# Patient Record
Sex: Male | Born: 1955
Health system: Southern US, Community
[De-identification: ages and names within clinical notes are randomized; demographics above are authoritative.]

## PROBLEM LIST (undated history)

## (undated) DIAGNOSIS — I4892 Unspecified atrial flutter: Secondary | ICD-10-CM

## (undated) DIAGNOSIS — I77810 Thoracic aortic ectasia: Secondary | ICD-10-CM

## (undated) DIAGNOSIS — I499 Cardiac arrhythmia, unspecified: Secondary | ICD-10-CM

## (undated) DIAGNOSIS — I5023 Acute on chronic systolic (congestive) heart failure: Secondary | ICD-10-CM

## (undated) DIAGNOSIS — K219 Gastro-esophageal reflux disease without esophagitis: Secondary | ICD-10-CM

## (undated) DIAGNOSIS — F419 Anxiety disorder, unspecified: Secondary | ICD-10-CM

## (undated) DIAGNOSIS — M1990 Unspecified osteoarthritis, unspecified site: Secondary | ICD-10-CM

## (undated) DIAGNOSIS — Z9289 Personal history of other medical treatment: Secondary | ICD-10-CM

## (undated) DIAGNOSIS — I340 Nonrheumatic mitral (valve) insufficiency: Secondary | ICD-10-CM

## (undated) DIAGNOSIS — Z860100 Personal history of colon polyps, unspecified: Secondary | ICD-10-CM

## (undated) DIAGNOSIS — I10 Essential (primary) hypertension: Secondary | ICD-10-CM

## (undated) DIAGNOSIS — R011 Cardiac murmur, unspecified: Secondary | ICD-10-CM

## (undated) DIAGNOSIS — G4700 Insomnia, unspecified: Secondary | ICD-10-CM

## (undated) DIAGNOSIS — T7840XA Allergy, unspecified, initial encounter: Secondary | ICD-10-CM

## (undated) DIAGNOSIS — I420 Dilated cardiomyopathy: Secondary | ICD-10-CM

## (undated) DIAGNOSIS — R569 Unspecified convulsions: Secondary | ICD-10-CM

## (undated) DIAGNOSIS — I4819 Other persistent atrial fibrillation: Secondary | ICD-10-CM

## (undated) DIAGNOSIS — I5022 Chronic systolic (congestive) heart failure: Secondary | ICD-10-CM

## (undated) DIAGNOSIS — I341 Nonrheumatic mitral (valve) prolapse: Secondary | ICD-10-CM

## (undated) HISTORY — PX: ARTHROSCOPY KNEE W/ DRILLING: SUR92

## (undated) HISTORY — PX: CARDIAC VALVE REPLACEMENT: SHX585

## (undated) HISTORY — PX: COLONOSCOPY: SHX174

## (undated) HISTORY — PX: JOINT REPLACEMENT: SHX530

## (undated) HISTORY — PX: LIMB SPARING RESECTION HIP W/ SADDLE JOINT REPLACEMENT: SUR829

## (undated) HISTORY — DX: Nonrheumatic mitral (valve) insufficiency: I34.0

## (undated) HISTORY — DX: Allergy, unspecified, initial encounter: T78.40XA

## (undated) HISTORY — PX: ANKLE FUSION: SHX881

## (undated) HISTORY — DX: Insomnia, unspecified: G47.00

## (undated) HISTORY — PX: HAIR TRANSPLANT: SHX1719

---

## 2000-01-07 ENCOUNTER — Encounter: Payer: Self-pay | Admitting: *Deleted

## 2000-01-07 ENCOUNTER — Encounter: Admission: RE | Admit: 2000-01-07 | Discharge: 2000-01-07 | Payer: Self-pay | Admitting: *Deleted

## 2010-09-24 ENCOUNTER — Ambulatory Visit: Payer: Self-pay | Admitting: Cardiology

## 2010-12-04 ENCOUNTER — Other Ambulatory Visit: Payer: Self-pay

## 2011-01-07 ENCOUNTER — Other Ambulatory Visit (HOSPITAL_COMMUNITY): Payer: Self-pay | Admitting: Orthopaedic Surgery

## 2011-01-07 ENCOUNTER — Encounter (HOSPITAL_COMMUNITY): Payer: BC Managed Care – PPO

## 2011-01-07 ENCOUNTER — Ambulatory Visit (HOSPITAL_COMMUNITY)
Admission: RE | Admit: 2011-01-07 | Discharge: 2011-01-07 | Disposition: A | Discharge status: Home / Self Care | Payer: BC Managed Care – PPO | Source: Ambulatory Visit | Attending: Orthopaedic Surgery | Admitting: Orthopaedic Surgery

## 2011-01-07 DIAGNOSIS — I10 Essential (primary) hypertension: Secondary | ICD-10-CM | POA: Insufficient documentation

## 2011-01-07 DIAGNOSIS — M25559 Pain in unspecified hip: Secondary | ICD-10-CM

## 2011-01-07 DIAGNOSIS — Z01818 Encounter for other preprocedural examination: Secondary | ICD-10-CM | POA: Insufficient documentation

## 2011-01-07 LAB — BASIC METABOLIC PANEL
CO2: 28 mEq/L (ref 19–32)
Calcium: 9.4 mg/dL (ref 8.4–10.5)
Chloride: 107 mEq/L (ref 96–112)
GFR calc Af Amer: >60 mL/min (ref >60)
Glucose, Bld: 89 mg/dL (ref 70–99)
Potassium: 4 mEq/L (ref 3.5–5.1)
Sodium: 142 mEq/L (ref 135–145)

## 2011-01-07 LAB — DIFFERENTIAL
Basophils Absolute: 0 10*3/uL (ref 0.0–0.1)
Basophils Relative: 0 % (ref 0–1)
Eosinophils Absolute: 0.2 10*3/uL (ref 0.0–0.7)
Eosinophils Relative: 2 % (ref 0–5)
Lymphocytes Relative: 16 % (ref 12–46)
Lymphs Abs: 1.4 10*3/uL (ref 0.7–4.0)
Monocytes Absolute: 0.6 10*3/uL (ref 0.1–1.0)
Monocytes Relative: 7 % (ref 3–12)
Neutro Abs: 6.8 10*3/uL (ref 1.7–7.7)
Neutrophils Relative %: 75 % (ref 43–77)

## 2011-01-07 LAB — URINALYSIS, ROUTINE W REFLEX MICROSCOPIC
Bilirubin Urine: NEGATIVE
Glucose, UA: NEGATIVE mg/dL
Hgb urine dipstick: NEGATIVE
Ketones, ur: NEGATIVE mg/dL
Nitrite: NEGATIVE
Protein, ur: NEGATIVE mg/dL
Specific Gravity, Urine: 1.03 (ref 1.005–1.030)
Urobilinogen, UA: 0.2 mg/dL (ref 0.0–1.0)
pH: 5.5 (ref 5.0–8.0)

## 2011-01-07 LAB — CBC
HCT: 40.4 % (ref 39.0–52.0)
Hemoglobin: 12.9 g/dL — ABNORMAL LOW (ref 13.0–17.0)
MCH: 29.7 pg (ref 26.0–34.0)
MCHC: 31.9 g/dL (ref 30.0–36.0)
MCV: 93.1 fL (ref 78.0–100.0)
Platelets: 200 10*3/uL (ref 150–400)
RBC: 4.34 MIL/uL (ref 4.22–5.81)
RDW: 13.2 % (ref 11.5–15.5)
WBC: 9 10*3/uL (ref 4.0–10.5)

## 2011-01-07 LAB — SURGICAL PCR SCREEN: MRSA, PCR: NEGATIVE

## 2011-01-09 ENCOUNTER — Inpatient Hospital Stay (HOSPITAL_COMMUNITY): Payer: BC Managed Care – PPO

## 2011-01-09 ENCOUNTER — Inpatient Hospital Stay (HOSPITAL_COMMUNITY)
Admission: RE | Admit: 2011-01-09 | Payer: BC Managed Care – PPO | Source: Ambulatory Visit | Admitting: Orthopaedic Surgery

## 2011-01-09 ENCOUNTER — Inpatient Hospital Stay (HOSPITAL_COMMUNITY)
Admission: RE | Admit: 2011-01-09 | Discharge: 2011-01-12 | DRG: 818 | Disposition: A | Discharge status: Home Health | Payer: BC Managed Care – PPO | Source: Ambulatory Visit | Attending: Orthopaedic Surgery | Admitting: Orthopaedic Surgery

## 2011-01-09 DIAGNOSIS — I10 Essential (primary) hypertension: Secondary | ICD-10-CM | POA: Diagnosis present

## 2011-01-09 DIAGNOSIS — M129 Arthropathy, unspecified: Secondary | ICD-10-CM | POA: Diagnosis present

## 2011-01-09 DIAGNOSIS — I059 Rheumatic mitral valve disease, unspecified: Secondary | ICD-10-CM | POA: Diagnosis present

## 2011-01-09 DIAGNOSIS — M1990 Unspecified osteoarthritis, unspecified site: Secondary | ICD-10-CM

## 2011-01-09 DIAGNOSIS — K219 Gastro-esophageal reflux disease without esophagitis: Secondary | ICD-10-CM | POA: Diagnosis present

## 2011-01-09 DIAGNOSIS — M169 Osteoarthritis of hip, unspecified: Principal | ICD-10-CM | POA: Diagnosis present

## 2011-01-09 DIAGNOSIS — M1610 Unilateral primary osteoarthritis, unspecified hip: Principal | ICD-10-CM | POA: Diagnosis present

## 2011-01-09 LAB — TYPE AND SCREEN
ABO/RH(D): O POS
Antibody Screen: NEGATIVE

## 2011-01-09 LAB — ABO/RH: ABO/RH(D): O POS

## 2011-01-10 LAB — CBC
HCT: 34.2 % — ABNORMAL LOW (ref 39.0–52.0)
MCHC: 31.9 g/dL (ref 30.0–36.0)
Platelets: 168 10*3/uL (ref 150–400)
RDW: 12.9 % (ref 11.5–15.5)

## 2011-01-10 LAB — BASIC METABOLIC PANEL
BUN: 8 mg/dL (ref 6–23)
CO2: 28 mEq/L (ref 19–32)
Calcium: 8.1 mg/dL — ABNORMAL LOW (ref 8.4–10.5)
Chloride: 102 mEq/L (ref 96–112)
Creatinine, Ser: 0.66 mg/dL (ref 0.4–1.5)

## 2011-01-10 LAB — PROTIME-INR: INR: 1.18 (ref 0.00–1.49)

## 2011-01-10 NOTE — Op Note (Signed)
[name], [name]                ACCOUNT NO.:  [id number]  MEDICAL RECORD NO.:  [id number]           PATIENT TYPE:  I  LOCATION:  1609                         FACILITY:  [facility]  PHYSICIAN:  Vanita Panda. Magnus Ivan, M.D.DATE OF BIRTH:  [birth year]  DATE OF PROCEDURE:  01/09/2011 DATE OF DISCHARGE:                              OPERATIVE REPORT   PREOPERATIVE DIAGNOSIS:  Severe osteoarthritis and degenerative joint disease, right hip.  POSTOPERATIVE DIAGNOSIS:  Severe osteoarthritis and degenerative joint disease, right hip.  PROCEDURE:  Right total hip arthroplasty through direct anterior approach.  IMPLANTS:  DePuy size 56 acetabular component with Gription, size 56 polyethylene liner, size 36 +8.5 ceramic head, standard offset Corail femoral component size 11.  SURGEON:  Vanita Panda. Magnus Ivan, MD  ASSISTANT:  Wende Neighbors, PA  ANESTHESIA:  General.  ANTIBIOTICS:  Ancef 2 g IV.  BLOOD LOSS:  500 mL.  COMPLICATIONS:  None.  INDICATIONS:  [name] is a 55 year old gentleman with end-stage osteoarthritis involving his right hip.  This has affected his activities of daily living.  He has gotten to where he has had tried injection of the hip and this has not helped and x-rays confirmed end- stage arthritis of the hip.  At this point, he wishes to proceed with a total hip arthroplasty.  The risks and benefits of surgery explained to him in detail and he did wish to proceed with surgery.  PROCEDURE DESCRIPTION:  After informed consent was obtained, appropriate right hip was marked.  He was brought to the operating room and general anesthesia was obtained while he was in the stretcher.  Foley catheter was placed and traction boots were placed on his feet, so he could probably be placed on the Hana OR table.  The perineal post was placed. He was placed on the Hana table.  I then used a C-arm to assess the hip center in the area where I judged the leg lengths.   Preoperatively, I felt clinically his leg lengths were about equal, but radiographically he was definitely off on his leg length with his right being shorter. We then prepped the right hip with DuraPrep and sterile drapes.  A time- out was called.  He was identified as the correct patient and correct right hip.  I then made an incision 1 cm distal and 3 cm posterior to the anterior-superior iliac spine.  I carried the oblique incision down the leg.  I was able to dissect the soft tissues down to the tensor fascia lata.  The tensor fascia lata was divided longitudinally and then I placed cobra retractor anterior on the lateral neck.  I teased up into the rectus femoris, reflected head and placed the cobra retractor.  I then divided the joint capsule to expose the femoral head and neck.  I dissected down further distally and cauterized the lateral femoral circumflex vessels.  I then made a femoral neck cut just proximal to lesser trochanter.  I removed the head and neck in its entirety.  I cleaned the acetabulum of debris.  I began reaming from size 46 reamer up to a 55.  We then placed a real 56 acetabulum component with Gription and under direct fluoroscopic guidance we were able to judge out anteversion and inclination.  Next, attention was turned to the femur. I externally rotated the leg all the way to 90 degrees, then extended the hip and adducted to allow exposure to the femoral canal.  Releasing tissue behind the greater trochanter and elevating the lateral joint capsule, we were able to gain access to the femoral canal.  I then began broaching up to size 11 broach with a compaction broaching system from Corail.  The 11 broach was tight.  I then placed 36 +1.5 head bone and reduced this in the acetabulum.  When I externally rotated him to 90 degrees he easily dislocated, so then I trailed a 5 and then an 8.5 hip bulk and this was felt to be much more stable.  I felt that may be  my stem was in a little bit too much anteversion as well.  I then placed a real Corail 11 stem with HA coating and a collar and was able to dial out some of the anteversion and get him a better alignment.  I then trialed the head again and we decided to go with +8.5 head, but this offered the most stability with internal and external rotation.  Under direct fluoroscopic guidance, he sill appears a little short on that hip; however, after we washed the tissues, closed the deep capsule, subcutaneous tissue and the subcuticular tissue and got him off the bed onto the stretcher, but waking up, he felt longer on the right side.  He did appear to be stable there.  We will see what postoperative films show.  He was awakened, extubated, and taken to recovery room in stable condition.  All final counts were correct and there were no complications noted.     Vanita Panda. Magnus Ivan, M.D.     CYB/MEDQ  D:  01/09/2011  T:  01/10/2011  Job:  [id number]  Electronically Signed by Doneen Poisson M.D. on 01/10/2011 07:18:18 PM

## 2011-01-10 NOTE — H&P (Signed)
  [name], [name]                ACCOUNT NO.:  [id number]  MEDICAL RECORD NO.:  [id number]           PATIENT TYPE:  I  LOCATION:  1609                         FACILITY:  [facility]  PHYSICIAN:  Vanita Panda. Magnus Ivan, M.D.DATE OF BIRTH:  12-28-1955  DATE OF ADMISSION:  01/09/2011 DATE OF DISCHARGE:                             HISTORY & PHYSICAL   CHIEF COMPLAINT:  Severe right hip pain.  HISTORY OF PRESENT ILLNESS:  [name] is a 55 year old gentleman with severe end-stage arthritis of his right hip.  He is coming to the operating room today for an elective right total hip arthroplasty due to the failure of conservative treatment including injections, antiinflammatories, and rest.  X-rays confirmed bone-on-bone ware of the hip.  He understands in detail the risks and benefits of surgery and does wish to proceed with surgery.  PAST MEDICAL HISTORY: 1. High blood pressure. 2. Mitral valve prolapse. 3. Acid reflux.4. Arthritis.  CURRENT MEDICATIONS:  See medical reconciliation orders.  ALLERGIES:  No known drug allergies.  FAMILY MEDICAL HISTORY:  Diabetes and high blood pressure.  SOCIAL HISTORY:  He is married.  He does not smoke and he does drink daily.  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  REVIEW OF SYSTEMS:  Negative for chest pain, shortness of breath, fever, chills, nausea, and vomiting.  PHYSICAL EXAMINATION:  VITAL SIGNS:  He is afebrile with stable vital signs. GENERAL:  He is alert and oriented x3 in no acute distress or obvious discomfort. HEENT:  Normocephalic, atraumatic.  Pupils are equal, round, and reactive to light.  Extraocular muscles are intact. NECK:  Supple. LUNGS:  Clear to auscultation bilaterally. HEART:  Regular rate and rhythm with a small murmur. ABDOMEN:  Benign. EXTREMITIES:  Right hip shows severe pain with internal and external rotation, flexion and extension.  IMAGING:  X-rays again reviewed and confirmed end-stage arthritis  of the right hip.  ASSESSMENT:  Severe osteoarthritis, right hip.  PLAN:  We will proceed with a direct anterior total hip arthroplasty today.  Again, he understands the risks and benefits of the surgery and the reason behind proceeding with surgery.  He will be admitted as an inpatient following surgery.     Vanita Panda. Magnus Ivan, M.D.     CYB/MEDQ  D:  01/09/2011  T:  01/10/2011  Job:  [id number]  Electronically Signed by Doneen Poisson M.D. on 01/10/2011 07:18:15 PM

## 2011-01-11 LAB — CBC
MCH: 30.2 pg (ref 26.0–34.0)
MCV: 93.5 fL (ref 78.0–100.0)
Platelets: 146 10*3/uL — ABNORMAL LOW (ref 150–400)
RDW: 13 % (ref 11.5–15.5)
WBC: 9.4 10*3/uL (ref 4.0–10.5)

## 2011-01-11 LAB — PROTIME-INR: Prothrombin Time: 17.3 seconds — ABNORMAL HIGH (ref 11.6–15.2)

## 2011-01-11 LAB — BASIC METABOLIC PANEL
BUN: 7 mg/dL (ref 6–23)
Creatinine, Ser: 0.66 mg/dL (ref 0.4–1.5)
GFR calc non Af Amer: >60 mL/min (ref >60)

## 2011-01-12 LAB — BASIC METABOLIC PANEL
BUN: 7 mg/dL (ref 6–23)
Calcium: 8 mg/dL — ABNORMAL LOW (ref 8.4–10.5)
GFR calc non Af Amer: >60 mL/min (ref >60)
Glucose, Bld: 124 mg/dL — ABNORMAL HIGH (ref 70–99)

## 2011-01-12 LAB — CBC
MCH: 29.7 pg (ref 26.0–34.0)
MCHC: 31.9 g/dL (ref 30.0–36.0)
Platelets: 142 10*3/uL — ABNORMAL LOW (ref 150–400)
RDW: 12.7 % (ref 11.5–15.5)

## 2011-01-12 LAB — PROTIME-INR: Prothrombin Time: 18.5 seconds — ABNORMAL HIGH (ref 11.6–15.2)

## 2011-09-22 ENCOUNTER — Other Ambulatory Visit (HOSPITAL_COMMUNITY): Payer: Self-pay | Admitting: Orthopedic Surgery

## 2011-09-28 ENCOUNTER — Encounter (HOSPITAL_COMMUNITY): Payer: Self-pay | Admitting: Pharmacy Technician

## 2011-10-02 ENCOUNTER — Encounter (HOSPITAL_COMMUNITY): Payer: Self-pay

## 2011-10-02 ENCOUNTER — Encounter (HOSPITAL_COMMUNITY)
Admission: RE | Admit: 2011-10-02 | Discharge: 2011-10-02 | Disposition: A | Discharge status: Home / Self Care | Payer: BC Managed Care – PPO | Source: Ambulatory Visit | Attending: Orthopedic Surgery | Admitting: Orthopedic Surgery

## 2011-10-02 HISTORY — DX: Essential (primary) hypertension: I10

## 2011-10-02 HISTORY — DX: Unspecified convulsions: R56.9

## 2011-10-02 HISTORY — DX: Gastro-esophageal reflux disease without esophagitis: K21.9

## 2011-10-02 HISTORY — DX: Unspecified osteoarthritis, unspecified site: M19.90

## 2011-10-02 LAB — PROTIME-INR
INR: 1.04 (ref 0.00–1.49)
Prothrombin Time: 13.8 seconds (ref 11.6–15.2)

## 2011-10-02 LAB — CBC
MCH: 30.4 pg (ref 26.0–34.0)
MCV: 91.8 fL (ref 78.0–100.0)
Platelets: 181 10*3/uL (ref 150–400)
RDW: 12.8 % (ref 11.5–15.5)

## 2011-10-02 LAB — COMPREHENSIVE METABOLIC PANEL
AST: 13 U/L (ref 0–37)
Albumin: 4 g/dL (ref 3.5–5.2)
BUN: 18 mg/dL (ref 6–23)
CO2: 26 mEq/L (ref 19–32)
Calcium: 9.2 mg/dL (ref 8.4–10.5)
Creatinine, Ser: 0.77 mg/dL (ref 0.50–1.35)
GFR calc non Af Amer: >90 mL/min (ref >90)

## 2011-10-02 LAB — SURGICAL PCR SCREEN: MRSA, PCR: NEGATIVE

## 2011-10-02 NOTE — Pre-Procedure Instructions (Signed)
[name]  10/02/2011   Your procedure is scheduled on:  10-07-2011 @ 2:00PM  Report to [facility] at 12 noon.  Call this number if you have problems the morning of surgery: [phone]   Remember:   Do not eat food:After Midnight.  May have clear liquids: up to 4 Hours before arrival.  Clear liquids include soda, tea, black coffee, apple or grape juice, broth. Until 8:00 AM  Take these medicines the morning of surgery with A SIP OF WATER  Norvasc,Cardura,oxycodon.Proscar    Do not wear jewelry, make-up or nail polish.  Do not wear lotions, powders, or perfumes. You may wear deodorant.  Do not shave 48 hours prior to surgery.  Do not bring valuables to the hospital.  Contacts, dentures or bridgework may not be worn into surgery.  Leave suitcase in the car. After surgery it may be brought to your room.  For patients admitted to the hospital, checkout time is 11:00 AM the day of discharge.   Patients discharged the day of surgery will not be allowed to drive home.  Name and phone number of your driver:   Special Instructions: CHG Shower Use Special Wash: 1/2 bottle night before surgery and 1/2 bottle morning of surgery.   Please read over the following fact sheets that you were given: MRSA Information and Surgical Site Infection Prevention

## 2011-10-02 NOTE — Pre-Procedure Instructions (Signed)
[name]  10/02/2011   Your procedure is scheduled on: 10-07-2011  2:00 PM  Report to [facility] at 11:30  AM.  Time is per Dr. Audrie Lia office  Call this number if you have problems the morning of surgery: [phone]   Remember:   Do not eat food:After Midnight.  May have clear liquids: up to 4 Hours before arrival.  Clear liquids include soda, tea, black coffee, apple or grape juice, broth.  Until 7:30 AM  Take these medicines the morning of surgery with A SIP OF WATER:proscar, oxycodone amlodpine,doxazosin   Do not wear jewelry, make-up or nail polish.  Do not wear lotions, powders, or perfumes. You may wear deodorant.  Do not shave 48 hours prior to surgery.  Do not bring valuables to the hospital.  Contacts, dentures or bridgework may not be worn into surgery.  Leave suitcase in the car. After surgery it may be brought to your room.  For patients admitted to the hospital, checkout time is 11:00 AM the day of discharge.   Patients discharged the day of surgery will not be allowed to drive home.    Special Instructions: CHG Shower Use Special Wash: 1/2 bottle night before surgery and 1/2 bottle morning of surgery.   Please read over the following fact sheets that you were given: MRSA Information and Surgical Site Infection Prevention

## 2011-10-05 NOTE — Consult Note (Signed)
Anesthesia:  EKG noted and reviewed with Dr. Jean Rosenthal.  Plan to proceed with left tibiocalcaneal fusion as scheduled.

## 2011-10-06 MED ORDER — CEFAZOLIN SODIUM-DEXTROSE 2-3 GM-% IV SOLR
2.0000 g | INTRAVENOUS | Status: AC
  Administered 2011-10-07: 2 g via INTRAVENOUS
  Filled 2011-10-06: qty 50

## 2011-10-07 ENCOUNTER — Encounter (HOSPITAL_COMMUNITY): Payer: Self-pay | Admitting: Vascular Surgery

## 2011-10-07 ENCOUNTER — Encounter (HOSPITAL_COMMUNITY)
Admission: RE | Disposition: A | Discharge status: Home / Self Care | Payer: Self-pay | Source: Ambulatory Visit | Attending: Orthopedic Surgery

## 2011-10-07 ENCOUNTER — Inpatient Hospital Stay (HOSPITAL_COMMUNITY)
Admission: RE | Admit: 2011-10-07 | Discharge: 2011-10-09 | DRG: 219 | Disposition: A | Discharge status: Home / Self Care | Payer: BC Managed Care – PPO | Source: Ambulatory Visit | Attending: Orthopedic Surgery | Admitting: Orthopedic Surgery

## 2011-10-07 ENCOUNTER — Inpatient Hospital Stay (HOSPITAL_COMMUNITY): Payer: BC Managed Care – PPO | Admitting: Vascular Surgery

## 2011-10-07 DIAGNOSIS — M19079 Primary osteoarthritis, unspecified ankle and foot: Secondary | ICD-10-CM

## 2011-10-07 DIAGNOSIS — G40802 Other epilepsy, not intractable, without status epilepticus: Secondary | ICD-10-CM | POA: Diagnosis present

## 2011-10-07 DIAGNOSIS — I10 Essential (primary) hypertension: Secondary | ICD-10-CM | POA: Diagnosis present

## 2011-10-07 DIAGNOSIS — K219 Gastro-esophageal reflux disease without esophagitis: Secondary | ICD-10-CM | POA: Diagnosis present

## 2011-10-07 DIAGNOSIS — Z01810 Encounter for preprocedural cardiovascular examination: Secondary | ICD-10-CM

## 2011-10-07 DIAGNOSIS — M216X9 Other acquired deformities of unspecified foot: Principal | ICD-10-CM | POA: Diagnosis present

## 2011-10-07 DIAGNOSIS — Z01812 Encounter for preprocedural laboratory examination: Secondary | ICD-10-CM

## 2011-10-07 DIAGNOSIS — Z96649 Presence of unspecified artificial hip joint: Secondary | ICD-10-CM

## 2011-10-07 DIAGNOSIS — Z01818 Encounter for other preprocedural examination: Secondary | ICD-10-CM

## 2011-10-07 SURGERY — ANKLE FUSION
Anesthesia: General | Site: Ankle | Laterality: Left | Wound class: Clean

## 2011-10-07 MED ORDER — WARFARIN SODIUM 10 MG PO TABS
10.0000 mg | ORAL_TABLET | Freq: Once | ORAL | Status: AC
  Administered 2011-10-07 (×2): 10 mg via ORAL
  Filled 2011-10-07: qty 1

## 2011-10-07 MED ORDER — HYDROMORPHONE HCL PF 1 MG/ML IJ SOLN: 0.2500 mg | INTRAMUSCULAR | Status: DC | PRN

## 2011-10-07 MED ORDER — FENTANYL CITRATE 0.05 MG/ML IJ SOLN
100.0000 ug | INTRAMUSCULAR | Status: DC | PRN
  Administered 2011-10-07: 100 ug via INTRAVENOUS

## 2011-10-07 MED ORDER — METHOCARBAMOL 500 MG PO TABS
500.0000 mg | ORAL_TABLET | Freq: Four times a day (QID) | ORAL | Status: DC | PRN
  Administered 2011-10-07 – 2011-10-09 (×4): 500 mg via ORAL
  Filled 2011-10-07 (×4): qty 1

## 2011-10-07 MED ORDER — MIDAZOLAM HCL 2 MG/2ML IJ SOLN
INTRAMUSCULAR | Status: AC
  Filled 2011-10-07: qty 2

## 2011-10-07 MED ORDER — CEFAZOLIN SODIUM-DEXTROSE 2-3 GM-% IV SOLR
2.0000 g | Freq: Four times a day (QID) | INTRAVENOUS | Status: AC
  Administered 2011-10-07 – 2011-10-08 (×3): 2 g via INTRAVENOUS
  Filled 2011-10-07 (×3): qty 50

## 2011-10-07 MED ORDER — DIPHENHYDRAMINE HCL 12.5 MG/5ML PO ELIX
12.5000 mg | ORAL_SOLUTION | ORAL | Status: DC | PRN
  Filled 2011-10-07: qty 10

## 2011-10-07 MED ORDER — METHOCARBAMOL 100 MG/ML IJ SOLN: 500.0000 mg | Freq: Four times a day (QID) | INTRAVENOUS | Status: DC | PRN

## 2011-10-07 MED ORDER — FENTANYL CITRATE 0.05 MG/ML IJ SOLN
INTRAMUSCULAR | Status: AC
  Filled 2011-10-07: qty 2

## 2011-10-07 MED ORDER — GLYCOPYRROLATE 0.2 MG/ML IJ SOLN
INTRAMUSCULAR | Status: DC | PRN
  Administered 2011-10-07: 0.2 mg via INTRAVENOUS

## 2011-10-07 MED ORDER — FENTANYL CITRATE 0.05 MG/ML IJ SOLN
INTRAMUSCULAR | Status: DC | PRN
  Administered 2011-10-07 (×3): 50 ug via INTRAVENOUS
  Administered 2011-10-07: 100 ug via INTRAVENOUS

## 2011-10-07 MED ORDER — METOCLOPRAMIDE HCL 5 MG/ML IJ SOLN
5.0000 mg | Freq: Three times a day (TID) | INTRAMUSCULAR | Status: DC | PRN
  Filled 2011-10-07: qty 2

## 2011-10-07 MED ORDER — HYDROMORPHONE HCL PF 1 MG/ML IJ SOLN
0.5000 mg | INTRAMUSCULAR | Status: DC | PRN
  Administered 2011-10-07 – 2011-10-08 (×7): 1 mg via INTRAVENOUS
  Administered 2011-10-08: 0.5 mg via INTRAVENOUS
  Filled 2011-10-07 (×8): qty 1

## 2011-10-07 MED ORDER — LACTATED RINGERS IV SOLN
INTRAVENOUS | Status: DC
  Administered 2011-10-07: 12:00:00 via INTRAVENOUS

## 2011-10-07 MED ORDER — DROPERIDOL 2.5 MG/ML IJ SOLN: 0.6250 mg | INTRAMUSCULAR | Status: DC | PRN

## 2011-10-07 MED ORDER — METOCLOPRAMIDE HCL 10 MG PO TABS: 5.0000 mg | ORAL_TABLET | Freq: Three times a day (TID) | ORAL | Status: DC | PRN

## 2011-10-07 MED ORDER — HYDROCODONE-ACETAMINOPHEN 10-325 MG PO TABS: 1.0000 | ORAL_TABLET | ORAL | Status: DC | PRN

## 2011-10-07 MED ORDER — WARFARIN VIDEO: Freq: Once | Status: DC

## 2011-10-07 MED ORDER — ONDANSETRON HCL 4 MG PO TABS: 4.0000 mg | ORAL_TABLET | Freq: Four times a day (QID) | ORAL | Status: DC | PRN

## 2011-10-07 MED ORDER — SODIUM CHLORIDE 0.9 % IR SOLN
Status: DC | PRN
  Administered 2011-10-07: 1000 mL

## 2011-10-07 MED ORDER — LACTATED RINGERS IV SOLN
INTRAVENOUS | Status: DC | PRN
  Administered 2011-10-07: 12:00:00 via INTRAVENOUS

## 2011-10-07 MED ORDER — ONDANSETRON HCL 4 MG/2ML IJ SOLN: 4.0000 mg | Freq: Four times a day (QID) | INTRAMUSCULAR | Status: DC | PRN

## 2011-10-07 MED ORDER — ONDANSETRON HCL 4 MG/2ML IJ SOLN
INTRAMUSCULAR | Status: DC | PRN
  Administered 2011-10-07: 4 mg via INTRAVENOUS

## 2011-10-07 MED ORDER — DOXAZOSIN MESYLATE 4 MG PO TABS
4.0000 mg | ORAL_TABLET | Freq: Every evening | ORAL | Status: DC
Start: 2011-10-07 — End: 2011-10-09
  Administered 2011-10-07 – 2011-10-08 (×2): 4 mg via ORAL
  Filled 2011-10-07 (×3): qty 1

## 2011-10-07 MED ORDER — DOCUSATE SODIUM 100 MG PO CAPS
100.0000 mg | ORAL_CAPSULE | Freq: Two times a day (BID) | ORAL | Status: DC
  Administered 2011-10-07 – 2011-10-08 (×3): 100 mg via ORAL
  Filled 2011-10-07 (×6): qty 1

## 2011-10-07 MED ORDER — AMLODIPINE BESYLATE 10 MG PO TABS
10.0000 mg | ORAL_TABLET | ORAL | Status: DC
  Administered 2011-10-08 – 2011-10-09 (×2): 10 mg via ORAL
  Filled 2011-10-07 (×3): qty 1

## 2011-10-07 MED ORDER — ACETAMINOPHEN 10 MG/ML IV SOLN
INTRAVENOUS | Status: DC | PRN
  Administered 2011-10-07: 1000 mg via INTRAVENOUS

## 2011-10-07 MED ORDER — PATIENT'S GUIDE TO USING COUMADIN BOOK
Freq: Once | Status: AC
  Administered 2011-10-07: 20:00:00
  Filled 2011-10-07: qty 1

## 2011-10-07 MED ORDER — PROPOFOL 10 MG/ML IV EMUL
INTRAVENOUS | Status: DC | PRN
  Administered 2011-10-07: 200 mg via INTRAVENOUS

## 2011-10-07 MED ORDER — ACETAMINOPHEN 10 MG/ML IV SOLN
INTRAVENOUS | Status: AC
  Filled 2011-10-07: qty 100

## 2011-10-07 MED ORDER — SODIUM CHLORIDE 0.9 % IV SOLN
INTRAVENOUS | Status: DC
  Administered 2011-10-07: 20 mL/h via INTRAVENOUS

## 2011-10-07 MED ORDER — FINASTERIDE 5 MG PO TABS: 2.5000 mg | ORAL_TABLET | ORAL | Status: DC

## 2011-10-07 MED ORDER — MIDAZOLAM HCL 2 MG/2ML IJ SOLN
2.0000 mg | Freq: Once | INTRAMUSCULAR | Status: AC
  Administered 2011-10-07: 2 mg via INTRAVENOUS

## 2011-10-07 MED ORDER — FINASTERIDE 5 MG PO TABS
2.5000 mg | ORAL_TABLET | Freq: Every day | ORAL | Status: DC
  Filled 2011-10-07: qty 0.5

## 2011-10-07 MED ORDER — OXYCODONE HCL 5 MG PO TABS
5.0000 mg | ORAL_TABLET | ORAL | Status: DC | PRN
  Administered 2011-10-08 (×2): 10 mg via ORAL
  Filled 2011-10-07 (×2): qty 2

## 2011-10-07 SURGICAL SUPPLY — 51 items
2.6X80CM GUIDEWIRE ×2 IMPLANT
3.2MM GUIDEWIRE ×2 IMPLANT
BANDAGE ESMARK 6X9 LF (GAUZE/BANDAGES/DRESSINGS) IMPLANT
BANDAGE GAUZE ELAST BULKY 4 IN (GAUZE/BANDAGES/DRESSINGS) ×4 IMPLANT
BIT DRILL CALIBRATED 4.3X320MM (BIT) ×1 IMPLANT
BIT DRILL CANN 7X200 (BIT) ×2 IMPLANT
BLADE SAW SGTL HD 18.5X60.5X1. (BLADE) ×2 IMPLANT
BLADE SURG 10 STRL SS (BLADE) IMPLANT
BNDG COHESIVE 4X5 TAN STRL (GAUZE/BANDAGES/DRESSINGS) ×2 IMPLANT
BNDG ESMARK 6X9 LF (GAUZE/BANDAGES/DRESSINGS)
CLOTH BEACON ORANGE TIMEOUT ST (SAFETY) ×2 IMPLANT
COTTON STERILE ROLL (GAUZE/BANDAGES/DRESSINGS) IMPLANT
COVER MAYO STAND STRL (DRAPES) IMPLANT
COVER SURGICAL LIGHT HANDLE (MISCELLANEOUS) ×2 IMPLANT
DRAPE INCISE IOBAN 66X45 STRL (DRAPES) ×2 IMPLANT
DRAPE OEC MINIVIEW 54X84 (DRAPES) IMPLANT
DRAPE U-SHAPE 47X51 STRL (DRAPES) ×2 IMPLANT
DRILL CALIBRATED 4.3X320MM (BIT) ×2
DRSG ADAPTIC 3X8 NADH LF (GAUZE/BANDAGES/DRESSINGS) ×2 IMPLANT
DURAPREP 26ML APPLICATOR (WOUND CARE) ×2 IMPLANT
ELECT REM PT RETURN 9FT ADLT (ELECTROSURGICAL) ×2
ELECTRODE REM PT RTRN 9FT ADLT (ELECTROSURGICAL) ×1 IMPLANT
GLOVE BIOGEL PI IND STRL 9 (GLOVE) ×1 IMPLANT
GLOVE BIOGEL PI INDICATOR 9 (GLOVE) ×1
GLOVE SURG ORTHO 9.0 STRL STRW (GLOVE) ×2 IMPLANT
GOWN PREVENTION PLUS XLARGE (GOWN DISPOSABLE) ×2 IMPLANT
GOWN SRG XL XLNG 56XLVL 4 (GOWN DISPOSABLE) ×2 IMPLANT
GOWN STRL NON-REIN XL XLG LVL4 (GOWN DISPOSABLE) ×2
GUIDEWIRE 2.6X80 BEAD TIP (Wire) ×1 IMPLANT
GUIDEWIRE LAGSCREW 3.2X460 (WIRE) ×2 IMPLANT
GUIDWIRE 2.6X80 BEAD TIP (Wire) ×2 IMPLANT
KIT BASIN OR (CUSTOM PROCEDURE TRAY) ×2 IMPLANT
KIT ROOM TURNOVER OR (KITS) ×2 IMPLANT
MANIFOLD NEPTUNE II (INSTRUMENTS) ×2 IMPLANT
NAIL LOCK ANKLE ANTE 11X180 (Nail) ×2 IMPLANT
NS IRRIG 1000ML POUR BTL (IV SOLUTION) ×2 IMPLANT
PACK ORTHO EXTREMITY (CUSTOM PROCEDURE TRAY) ×2 IMPLANT
PAD ARMBOARD 7.5X6 YLW CONV (MISCELLANEOUS) ×4 IMPLANT
PAD CAST 4YDX4 CTTN HI CHSV (CAST SUPPLIES) ×1 IMPLANT
PADDING CAST COTTON 4X4 STRL (CAST SUPPLIES) ×1
SCREW CORT TI DBL LEAD 5X30 (Screw) ×2 IMPLANT
SCREW CORT TI DBL LEAD 5X44 (Screw) ×2 IMPLANT
SCREW CORT TI DBL LEAD 5X70 (Screw) ×2 IMPLANT
SPONGE GAUZE 4X4 12PLY (GAUZE/BANDAGES/DRESSINGS) ×2 IMPLANT
SPONGE LAP 18X18 X RAY DECT (DISPOSABLE) ×2 IMPLANT
SUCTION FRAZIER TIP 10 FR DISP (SUCTIONS) ×2 IMPLANT
SUT ETHILON 2 0 PSLX (SUTURE) ×6 IMPLANT
TOWEL OR 17X24 6PK STRL BLUE (TOWEL DISPOSABLE) ×2 IMPLANT
TOWEL OR 17X26 10 PK STRL BLUE (TOWEL DISPOSABLE) ×2 IMPLANT
TUBE CONNECTING 12X1/4 (SUCTIONS) ×2 IMPLANT
WATER STERILE IRR 1000ML POUR (IV SOLUTION) ×2 IMPLANT

## 2011-10-07 NOTE — Preoperative (Addendum)
Beta Blockers   Reason not to administer Beta Blockers:Not Applicable 

## 2011-10-07 NOTE — Anesthesia Procedure Notes (Signed)
Procedure Name: LMA Insertion Date/Time: 10/07/2011 12:11 PM Performed by: Malachi Pro Pre-anesthesia Checklist: Patient identified, Emergency Drugs available, Suction available, Patient being monitored and Timeout performed Patient Re-evaluated:Patient Re-evaluated prior to inductionOxygen Delivery Method: Circle System Utilized Preoxygenation: Pre-oxygenation with 100% oxygen Intubation Type: IV induction LMA: LMA inserted and LMA with gastric port inserted LMA Size: 5.0 Tube secured with: Tape Dental Injury: Teeth and Oropharynx as per pre-operative assessment

## 2011-10-07 NOTE — Anesthesia Preprocedure Evaluation (Addendum)
Anesthesia Evaluation  Patient identified by MRN, date of birth, ID band Patient awake    Reviewed: Allergy & Precautions, H&P , NPO status , Patient's Chart, lab work & pertinent test results  Airway Mallampati: II TM Distance: <3 FB     Dental  (+) Dental Advisory Given and Teeth Intact   Pulmonary neg pulmonary ROS,  clear to auscultation  Pulmonary exam normal       Cardiovascular hypertension, Pt. on medications Regular Normal- Systolic murmurs    Neuro/Psych Seizures -, Well Controlled,     GI/Hepatic Neg liver ROS, GERD-  Controlled,  Endo/Other  Negative Endocrine ROS  Renal/GU negative Renal ROS     Musculoskeletal   Abdominal   Peds  Hematology   Anesthesia Other Findings   Reproductive/Obstetrics                         Anesthesia Physical Anesthesia Plan  ASA: II  Anesthesia Plan: General   Post-op Pain Management:    Induction: Intravenous  Airway Management Planned: LMA  Additional Equipment:   Intra-op Plan:   Post-operative Plan:   Informed Consent: I have reviewed the patients History and Physical, chart, labs and discussed the procedure including the risks, benefits and alternatives for the proposed anesthesia with the patient or authorized representative who has indicated his/her understanding and acceptance.     Plan Discussed with: CRNA, Surgeon and Anesthesiologist  Anesthesia Plan Comments:        Anesthesia Quick Evaluation

## 2011-10-07 NOTE — Progress Notes (Signed)
ANTICOAGULATION CONSULT NOTE - Initial Consult  Pharmacy Consult for Coumadin Indication: VTE prophylaxis  No Known Allergies  Patient Measurements: Weight: 258 lb (117.028 kg) Adjusted Body Weight:   Vital Signs: Temp: 98.2 F (36.8 C) (12/05 1704) Temp src: Oral (12/05 1535) BP: 154/90 mmHg (12/05 1704) Pulse Rate: 67  (12/05 1704)  Labs: No results found for this basename: HGB:2,HCT:3,PLT:3,APTT:3,LABPROT:3,INR:3,HEPARINUNFRC:3,CREATININE:3,CKTOTAL:3,CKMB:3,TROPONINI:3 in the last 72 hours CrCl is unknown because there is no height on file for the current visit.  Medical History: Past Medical History  Diagnosis Date  . Hypertension   . Seizures     had one approx. 30 yrs ago,has not had any since  . GERD (gastroesophageal reflux disease)     otc medications  . Arthritis     Medications:  Scheduled:    . amLODipine  10 mg Oral Q0700  . ceFAZolin (ANCEF) IV  2 g Intravenous 60 min Pre-Op  . ceFAZolin (ANCEF) IV  2 g Intravenous Q6H  . docusate sodium  100 mg Oral BID  . doxazosin  4 mg Oral QPM  . midazolam  2 mg Intravenous Once  . patient's guide to using coumadin book   Does not apply Once  . warfarin  10 mg Oral Once  . warfarin   Does not apply Once  . DISCONTD: finasteride  5 mg Oral QODAY  . DISCONTD: finasteride  5 mg Oral Daily    Assessment: 55 yr old male admitted for ankle fusion. Now post op and needing DVT prophylaxis with coumadin. Goal of Therapy:  INR 2-3   Plan:  Will give 10 mg coumadin tonight. Ordered coumadin booklet and video for patient. Daily INR.  [name] 10/07/2011,6:29 PM

## 2011-10-07 NOTE — Transfer of Care (Signed)
Immediate Anesthesia Transfer of Care Note  Patient: [name]  Procedure(s) Performed:  ANKLE FUSION - Left Tibiocalcaneal Fusion  Patient Location: PACU  Anesthesia Type: General  Level of Consciousness: alert  and sedated  Airway & Oxygen Therapy: Patient Spontanous Breathing  Post-op Assessment: Post -op Vital signs reviewed and stable and Patient moving all extremities  Post vital signs: Reviewed and stable  Complications: No apparent anesthesia complications2

## 2011-10-07 NOTE — H&P (Signed)
[name] is an 55 y.o. male.   Chief Complaint: Left ankle pain and deformity. HPI: Patient has cavovarus collapse of the ankle with deformity and pain with activities of daily living he has failed conservative care.  Past Medical History  Diagnosis Date  . Hypertension   . Seizures     had one approx. 30 yrs ago,has not had any since  . GERD (gastroesophageal reflux disease)     otc medications  . Arthritis     Past Surgical History  Procedure Date  . Joint replacement     right hip  01-2011  . Arthroscopy knee w/ drilling bilateral    No family history on file. Social History:  reports that he has never smoked. He does not have any smokeless tobacco history on file. He reports that he drinks about .6 ounces of alcohol per week. He reports that he does not use illicit drugs.  Allergies: No Known Allergies  Medications Prior to Admission  Medication Dose Route Frequency Provider Last Rate Last Dose  . ceFAZolin (ANCEF) IVPB 2 g/50 mL premix  2 g Intravenous 60 min Pre-Op [place], [place]       No current outpatient prescriptions on file as of 10/07/2011.    No results found for this or any previous visit (from the past 48 hour(s)). No results found.  Review of Systems  Constitutional: Negative.   HENT: Negative.   Eyes: Negative.   Respiratory: Negative.   Cardiovascular: Negative.   Gastrointestinal: Negative.   Genitourinary: Negative.   Musculoskeletal: Positive for joint pain.  Skin: Negative.   Neurological: Negative.   Endo/Heme/Allergies: Negative.   Psychiatric/Behavioral: Negative.   All other systems reviewed and are negative.    Blood pressure 162/94, pulse 92, temperature 98.4 F (36.9 C), temperature source Oral, resp. rate 18, SpO2 94.00%. Physical Exam patient has good pulses he has a cavus varus deformity of the hindfoot does not have a plantigrade foot. The skin is intact no signs of infection no  adenopathy.  Assessment/Plan Assessment cavovarus collapse left ankle and hindfoot. Plan plan for tibial calcaneal fusion. Risks and benefits of surgery were discussed including infection neurovascular injury persistent pain nonunion of the bone and the surgery patient states he understands and wished to proceed at this time  Gurveer Colucci V 10/07/2011, 11:37 AM

## 2011-10-07 NOTE — Anesthesia Postprocedure Evaluation (Signed)
Anesthesia Post Note  Patient: [name]  Procedure(s) Performed:  ANKLE FUSION - Left Tibiocalcaneal Fusion  Anesthesia type: General  Patient location: PACU  Post pain: Pain level controlled  Post assessment: Patient's Cardiovascular Status Stable  Last Vitals:  Filed Vitals:   10/07/11 1512  BP: 155/81  Pulse: 65  Temp:   Resp: 22    Post vital signs: Reviewed and stable  Level of consciousness: sedated  Complications: No apparent anesthesia complications

## 2011-10-07 NOTE — Op Note (Signed)
10/07/2011  1:30 PM  PATIENT:  [name]    PRE-OPERATIVE DIAGNOSIS:  Tibio-Talar-Calcaneal Arthritis  POST-OPERATIVE DIAGNOSIS:  Same  PROCEDURE:  ANKLE FUSION Tibial calcaneal fusion with a Biomet nail 11 x 180 mm in length locked in the talus proximal tibia and calcaneus  SURGEON:  DUDA,MARCUS V, MD  PHYSICIAN ASSISTANT: None  ANESTHESIA:   General  PREOPERATIVE INDICATIONS:  [name] is a  55 y.o. male with a diagnosis of Tibio-Talar-Calcaneal Arthritis who failed conservative measures and elected for surgical management.    The risks benefits and alternatives were discussed with the patient preoperatively including but not limited to the risks of infection, bleeding, nerve injury, cardiopulmonary complications, the need for revision surgery, among others, and the patient was willing to proceed.  OPERATIVE IMPLANTS: Biomet tibial calcaneal compression nail 11 x 180 mm locked proximally with a 30 mm screw through the talus with a 44 mm screw and through the calcaneus with a 70 mm screw  OPERATIVE FINDINGS: Significant collapse and arthritic changes through the tibial talar joint.  OPERATIVE PROCEDURE: Patient was brought to OR room tendon underwent a general anesthetic. After adequate levels of anesthesia were obtained patient was placed in the right lateral decubitus position with the left side up and the left lower extremity was prepped using DuraPrep and draped into a sterile field Ioban cover all exposed skin. A lateral incision was made over the fibula the distal aspect of the fibula was resected. Osteotomy cuts were made through the tibial talar joint perpendicular to the long axis of the tibia. The bone was resected there were 2 fresh bone edges after the resection and the ankle was at 90 with compression across the fusion site. Again incision was made plantarly guidewire was inserted from the calcaneus to the tibia. This was then overreamed to 12 mm for the 11 mm  nail. The nail was inserted. It was locked through the talus first followed by the tibial screw and then the compression screw was used to compress the tibial talar joint. A posterior calcaneus screw was then placed. C-arm fluoroscopy verified reduction in both AP and lateral planes. The wound is irrigated with normal saline hemostasis was obtained. The incisions were closed using 2-0 nylon the wounds were covered with Adaptic orthopedic sponges ABDs Kerlix and Coban. Patient was extubated taken the PACU in stable condition.

## 2011-10-08 MED ORDER — WARFARIN SODIUM 10 MG PO TABS
10.0000 mg | ORAL_TABLET | Freq: Once | ORAL | Status: AC
  Administered 2011-10-08: 10 mg via ORAL
  Filled 2011-10-08: qty 1

## 2011-10-08 MED ORDER — OXYCODONE HCL 5 MG PO TABS
5.0000 mg | ORAL_TABLET | ORAL | Status: DC | PRN
  Administered 2011-10-08 (×4): 15 mg via ORAL
  Administered 2011-10-09: 5 mg via ORAL
  Administered 2011-10-09 (×3): 15 mg via ORAL
  Filled 2011-10-08 (×8): qty 3

## 2011-10-08 MED ORDER — ALUM & MAG HYDROXIDE-SIMETH 200-200-20 MG/5ML PO SUSP
30.0000 mL | ORAL | Status: DC | PRN
  Administered 2011-10-08: 30 mL via ORAL
  Filled 2011-10-08: qty 30

## 2011-10-08 MED ORDER — OXYCODONE-ACETAMINOPHEN 7.5-325 MG PO TABS: 2.0000 | ORAL_TABLET | ORAL | Status: AC | PRN

## 2011-10-08 NOTE — Progress Notes (Signed)
Physical Therapy Evaluation Patient Details Name: [name] MRN: [id number] DOB: [birth year] Today's Date: 10/08/2011  Problem List: There is no problem list on file for this patient.   Past Medical History:  Past Medical History  Diagnosis Date  . Hypertension   . Seizures     had one approx. 30 yrs ago,has not had any since  . GERD (gastroesophageal reflux disease)     otc medications  . Arthritis    Past Surgical History:  Past Surgical History  Procedure Date  . Joint replacement     right hip  01-2011  . Arthroscopy knee w/ drilling bilateral    PT Assessment/Plan/Recommendation PT Assessment Clinical Impression Statement: pt s/p ankle fusion and requiring +2 A for safety on stairs.  pt needs cueing to slow down and attend to task as he can get shakey and try to rush through then requiring increased A.   PT Recommendation/Assessment: Patient will need skilled PT in the acute care venue PT Problem List: Decreased activity tolerance;Decreased balance;Decreased mobility;Decreased knowledge of use of DME;Pain;Decreased knowledge of precautions;Decreased safety awareness;Decreased coordination Barriers to Discharge: Decreased caregiver support Barriers to Discharge Comments: pt's wife currently on crutches and notes only has Dtr-in-law and his father who can A some.   PT Therapy Diagnosis : Difficulty walking;Acute pain PT Plan PT Frequency: Min 5X/week PT Treatment/Interventions: DME instruction;Gait training;Stair training;Functional mobility training;Therapeutic activities;Therapeutic exercise;Balance training;Patient/family education PT Recommendation Recommendations for Other Services: OT consult Follow Up Recommendations: Home health PT;24 hour supervision/assistance Equipment Recommended: None recommended by PT PT Goals  Acute Rehab PT Goals PT Goal Formulation: With patient Time For Goal Achievement: 7 days Pt will go Supine/Side to Sit: Independently PT Goal:  Supine/Side to Sit - Progress: Progressing toward goal Pt will go Sit to Stand: with modified independence PT Goal: Sit to Stand - Progress: Progressing toward goal Pt will Ambulate: 51 - 150 feet;with supervision;with rolling walker PT Goal: Ambulate - Progress: Progressing toward goal Pt will Go Up / Down Stairs: 3-5 stairs;with min assist;with rolling walker PT Goal: Up/Down Stairs - Progress: Progressing toward goal  PT Evaluation Precautions/Restrictions  Precautions Precautions: Fall Restrictions Weight Bearing Restrictions: Yes LLE Weight Bearing: Non weight bearing Prior Functioning  Home Living Lives With: Spouse (Spouse is currently on crutches with ankle surgery) Receives Help From: Family Type of Home: House Home Layout: One level Home Access: Stairs to enter Entrance Stairs-Rails: Right Entrance Stairs-Number of Steps: 4 Home Adaptive Equipment: Walker - rolling Prior Function Level of Independence: Independent with basic ADLs;Independent with homemaking with ambulation;Independent with gait;Independent with transfers Able to Take Stairs?: Reciprically Driving: Yes Cognition Cognition Orientation Level: Oriented X4 Sensation/Coordination   Extremity Assessment RLE Assessment RLE Assessment: Within Functional Limits LLE Assessment LLE Assessment:  (WFL except where casted.  ) Mobility (including Balance) Bed Mobility Bed Mobility: Yes Supine to Sit: 5: Supervision Transfers Transfers: Yes Sit to Stand: 4: Min assist;With upper extremity assist;From elevated surface;From bed Sit to Stand Details (indicate cue type and reason): cues for UE use, to slow down and focus on task at hand.   Stand to Sit: 4: Min assist;With upper extremity assist;To chair/3-in-1;With armrests Stand to Sit Details: cues for UE use, to control descent Ambulation/Gait Ambulation/Gait: Yes Ambulation/Gait Assistance: 4: Min assist Ambulation/Gait Assistance Details (indicate cue  type and reason): cues for safe use of RW, NWB, and to slow down Ambulation Distance (Feet): 50 Feet Assistive device: Rolling walker Gait Pattern:  (hop-to) Stairs: Yes Stairs Assistance: 1: +2  Total assist;Patient percentage (comment) (pt80%) Stairs Assistance Details (indicate cue type and reason): cues for technique with RW, to slow down, attention to task Stair Management Technique: Backwards;With walker Number of Stairs: 2     Exercise    End of Session PT - End of Session Equipment Utilized During Treatment: Gait belt Activity Tolerance: Patient limited by pain;Patient limited by fatigue Patient left: in chair;with call bell in reach Nurse Communication: Mobility status for transfers (pt not safe for D/C today.  ) General Behavior During Session: [facility] for tasks performed Cognition: [facility] for tasks performed  Sunny Schlein, [place] [phone] 10/08/2011, 3:37 PM

## 2011-10-08 NOTE — Progress Notes (Signed)
ANTICOAGULATION CONSULT NOTE - Follow Up Consult  Pharmacy Consult for Coumadin Indication: VTE prophylaxis  No Known Allergies  Patient Measurements: Height: 6\' 2"  (188 cm) Weight: 258 lb (117.028 kg) IBW/kg (Calculated) : 82.2   Vital Signs: Temp: 99.1 F (37.3 C) (12/06 0630) BP: 145/80 mmHg (12/06 0630) Pulse Rate: 68  (12/06 0630)  Labs: No results found for this basename: HGB:2,HCT:3,PLT:3,APTT:3,LABPROT:3,INR:3,HEPARINUNFRC:3,CREATININE:3,CKTOTAL:3,CKMB:3,TROPONINI:3 in the last 72 hours Estimated Creatinine Clearance: 140.1 ml/min (by C-G formula based on Cr of 0.77).   Medications:  Scheduled:    . amLODipine  10 mg Oral Q0700  . ceFAZolin (ANCEF) IV  2 g Intravenous 60 min Pre-Op  . ceFAZolin (ANCEF) IV  2 g Intravenous Q6H  . docusate sodium  100 mg Oral BID  . doxazosin  4 mg Oral QPM  . midazolam  2 mg Intravenous Once  . patient's guide to using coumadin book   Does not apply Once  . warfarin  10 mg Oral Once  . warfarin   Does not apply Once  . DISCONTD: finasteride  5 mg Oral QODAY  . DISCONTD: finasteride  5 mg Oral Daily    Assessment: 55 y/o male patient s/p ankle fusion receiving coumadin for dvt px. No bleeding reported, no INR reported.  Goal of Therapy:  INR 2-3   Plan:  Repeat coumadin 10mg  today and f/u in am.  Verlene Mayer, PharmD, BCPS 12/6/201211:27 AM  10/08/2011,11:26 AM

## 2011-10-09 LAB — PROTIME-INR: INR: 1.54 — ABNORMAL HIGH (ref 0.00–1.49)

## 2011-10-09 NOTE — Discharge Summary (Signed)
Physician Discharge Summary  Patient ID: [name] MRN: [id number] DOB/AGE: Jun 22, 1956 55 y.o.  Admit date: 10/07/2011 Discharge date: 10/09/2011  Admission Diagnoses: Osteoarthritis left ankle  Discharge Diagnoses: Osteoarthritis left ankle Active Problems:  * No active hospital problems. *    Discharged Condition: stable  Hospital Course: Patient's hospital course was essentially unremarkable. He was plan for discharge to home yesterday however he was not safe with physical therapy he stated 1 additional day to work with physical therapy to be safe with ambulation state safe with stairs.  Consults: none  Significant Diagnostic Studies: None  Treatments: surgery: Tibial calcaneal fusion on the left.  Discharge Exam: Blood pressure 155/89, pulse 81, temperature 99.1 F (37.3 C), temperature source Oral, resp. rate 18, height 6\' 2"  (1.88 m), weight 117.028 kg (258 lb), SpO2 97.00%. Incision/Wound: clean and dry at time of discharge.  Disposition: Home-Health Care Svc  Discharge Orders    Future Orders Please Complete By Expires   Crutches      Comments:   Patient will need crutches a kneeling walker and a large bedside commode   Walker rolling      Comments:   Patient will need a kneeling walker.   DME Bedside commode      Comments:   Patient requests a large bedside commode.   Crutches      Diet - low sodium heart healthy      Diet - low sodium heart healthy      Call MD / Call 911      Comments:   If you experience chest pain or shortness of breath, CALL 911 and be transported to the hospital emergency room.  If you develope a fever above 101 F, pus (white drainage) or increased drainage or redness at the wound, or calf pain, call your surgeon's office.   Constipation Prevention      Comments:   Drink plenty of fluids.  Prune juice may be helpful.  You may use a stool softener, such as Colace (over the counter) 100 mg twice a day.  Use MiraLax (over the counter)  for constipation as needed.   Increase activity slowly as tolerated      Weight Bearing as taught in Physical Therapy      Comments:   Use a walker or crutches as instructed.   Discharge instructions      Comments:   Nonweightbearing left lower extremity keep foot elevated above heart at all times he should fracture boot for ambulation with crutches nonweightbearing on the left foot   Change dressing      Scheduling Instructions:   Change dressing left lower extremity prior to discharge to home. Apply Xeroform to the incisions plus 4 x 4's plus Kerlix plus an Ace wrap.   Call MD / Call 911      Comments:   If you experience chest pain or shortness of breath, CALL 911 and be transported to the hospital emergency room.  If you develope a fever above 101 F, pus (white drainage) or increased drainage or redness at the wound, or calf pain, call your surgeon's office.   Constipation Prevention      Comments:   Drink plenty of fluids.  Prune juice may be helpful.  You may use a stool softener, such as Colace (over the counter) 100 mg twice a day.  Use MiraLax (over the counter) for constipation as needed.   Increase activity slowly as tolerated      Weight Bearing  as taught in Physical Therapy      Comments:   Use a walker or crutches as instructed.   Discharge instructions      Comments:   Keep left leg elevated above heart nonweightbearing left lower extremity where the fracture boot when ambulating to protect the foot and ankle.   Remv/revisn boot/body cast        Current Discharge Medication List    START taking these medications   Details  oxyCODONE-acetaminophen (PERCOCET) 7.5-325 MG per tablet Take 2 tablets by mouth every 4 (four) hours as needed for pain. Qty: 60 tablet, Refills: 0      CONTINUE these medications which have NOT CHANGED   Details  amLODipine (NORVASC) 10 MG tablet Take 10 mg by mouth every morning.      doxazosin (CARDURA) 4 MG tablet Take 4 mg by mouth every  evening.      finasteride (PROSCAR) 5 MG tablet Take 2.5 mg by mouth every other day.      Glucosamine-Chondroitin (MOVE FREE PO) Take 1 tablet by mouth 4 (four) times daily. OTC     Omega-3 Fatty Acids (FISH OIL PO) Take 2,000 mg by mouth daily.      OVER THE COUNTER MEDICATION Take 1 capsule by mouth daily. Stool softner    oxyCODONE-acetaminophen (PERCOCET) 10-650 MG per tablet Take 1 tablet by mouth every 8 (eight) hours as needed. For pain     Tetrahydrozoline HCl (VISINE OP) Place 1 drop into both eyes daily as needed. For dry eyes        Follow-up Information    Follow up with Geneve Kimpel V, MD in 1 week.   Contact information:   [address] [place] [address] [phone]          Signed: Nadara Mustard 10/09/2011, 6:44 AM

## 2011-10-09 NOTE — Progress Notes (Signed)
PHarmacy Note: Patient has been discharged.  Was educated on warfarin while in hospital. However, patient will not be discharged on this due to low risk of VTE.

## 2012-03-24 DIAGNOSIS — E291 Testicular hypofunction: Secondary | ICD-10-CM | POA: Insufficient documentation

## 2012-04-13 ENCOUNTER — Encounter: Payer: Self-pay | Admitting: Gastroenterology

## 2012-04-26 ENCOUNTER — Other Ambulatory Visit: Payer: Self-pay | Admitting: Orthopedic Surgery

## 2012-04-26 DIAGNOSIS — M25572 Pain in left ankle and joints of left foot: Secondary | ICD-10-CM

## 2012-04-27 ENCOUNTER — Ambulatory Visit
Admission: RE | Admit: 2012-04-27 | Discharge: 2012-04-27 | Disposition: A | Discharge status: Home / Self Care | Payer: BC Managed Care – PPO | Source: Ambulatory Visit | Attending: Orthopedic Surgery | Admitting: Orthopedic Surgery

## 2012-04-27 DIAGNOSIS — M25572 Pain in left ankle and joints of left foot: Secondary | ICD-10-CM

## 2012-06-03 ENCOUNTER — Ambulatory Visit (AMBULATORY_SURGERY_CENTER): Payer: BC Managed Care – PPO | Admitting: *Deleted

## 2012-06-03 ENCOUNTER — Encounter: Payer: Self-pay | Admitting: Gastroenterology

## 2012-06-03 VITALS — Ht 75.0 in | Wt 259.2 lb

## 2012-06-03 DIAGNOSIS — Z1211 Encounter for screening for malignant neoplasm of colon: Secondary | ICD-10-CM

## 2012-06-03 DIAGNOSIS — Z800 Family history of malignant neoplasm of digestive organs: Secondary | ICD-10-CM

## 2012-06-03 MED ORDER — MOVIPREP 100 G PO SOLR: ORAL | Status: DC

## 2012-06-07 ENCOUNTER — Telehealth: Payer: Self-pay | Admitting: *Deleted

## 2012-06-07 NOTE — Telephone Encounter (Signed)
OK to proceed ascheduled. First degree relative with colon cancer is recommended to have a colonoscopy every 5 years

## 2012-06-07 NOTE — Telephone Encounter (Signed)
Left message with pt's wife to keep colonoscopy appt as scheduled.

## 2012-06-07 NOTE — Telephone Encounter (Signed)
Dr. Russella Dar;  Pt is scheduled for direct colonoscopy with you on Friday 8/9.  Family hx colon cancer in father.  Pt's last colonoscopy was with [facility] in [place] in 2006.  I called to get records from previous colonoscopy and was told that pt's records were in the warehouse and would take 2 weeks to request.  Is it okay to proceed with colonoscopy as scheduled?

## 2012-06-09 ENCOUNTER — Encounter (HOSPITAL_COMMUNITY): Payer: Self-pay | Admitting: Pharmacy Technician

## 2012-06-10 ENCOUNTER — Encounter: Payer: Self-pay | Admitting: Gastroenterology

## 2012-06-10 ENCOUNTER — Ambulatory Visit (AMBULATORY_SURGERY_CENTER): Payer: BC Managed Care – PPO | Admitting: Gastroenterology

## 2012-06-10 VITALS — BP 142/84 | HR 78 | Temp 96.3°F | Resp 20 | Ht 74.0 in | Wt 258.0 lb

## 2012-06-10 DIAGNOSIS — D126 Benign neoplasm of colon, unspecified: Secondary | ICD-10-CM

## 2012-06-10 DIAGNOSIS — Z800 Family history of malignant neoplasm of digestive organs: Secondary | ICD-10-CM

## 2012-06-10 DIAGNOSIS — Z1211 Encounter for screening for malignant neoplasm of colon: Secondary | ICD-10-CM

## 2012-06-10 MED ORDER — SODIUM CHLORIDE 0.9 % IV SOLN: 500.0000 mL | INTRAVENOUS | Status: DC

## 2012-06-10 NOTE — Progress Notes (Signed)
Patient did not experience any of the following events: a burn prior to discharge; a fall within the facility; wrong site/side/patient/procedure/implant event; or a hospital transfer or hospital admission upon discharge from the facility. (G8907) Patient did not have preoperative order for IV antibiotic SSI prophylaxis. (G8918)  

## 2012-06-10 NOTE — Patient Instructions (Addendum)

## 2012-06-10 NOTE — Progress Notes (Signed)
PATIENT COMPLAINED OF MUSCLE PAIN RT. ARM FOLLOWING AC IV STICK, IV REMOVED. PAIN RESOLVED.

## 2012-06-10 NOTE — Op Note (Signed)
[facility] [address] [place], [place]  [address]  COLONOSCOPY PROCEDURE REPORT  PATIENT:  [name], [name]  MR#:  [id number] BIRTHDATE:  1956-05-23, 56 yrs. old  GENDER:  male ENDOSCOPIST:  Judie Petit T. Russella Dar, MD, [facility] Referred by:  Tracey Harries, M.D. PROCEDURE DATE:  06/10/2012 PROCEDURE:  Colonoscopy with biopsy ASA CLASS:  Class II INDICATIONS:  1) Elevated Risk Screening  2) family history of colon cancer: father at 23 MEDICATIONS:   MAC sedation, administered by CRNA, propofol (Diprivan) 300 mg IV DESCRIPTION OF PROCEDURE:   After the risks benefits and alternatives of the procedure were thoroughly explained, informed consent was obtained.  Digital rectal exam was performed and revealed no abnormalities.   The LB PCF-H180AL [id number] endoscope was introduced through the anus and advanced to the cecum, which was identified by both the appendix and ileocecal valve, without limitations.  The quality of the prep was good, using MoviPrep. The instrument was then slowly withdrawn as the colon was fully examined. <<PROCEDUREIMAGES>> FINDINGS:  Moderate diverticulosis was found in the sigmoid to transverse colon.  A sessile polyp was found in the ascending colon. It was 4 mm in size. The polyp was removed using cold biopsy forceps.  Otherwise normal colonoscopy without other polyps, masses, vascular ectasias, or inflammatory changes. Retroflexed views in the rectum revealed internal hemorrhoids, small. The time to cecum =  2.5  minutes. The scope was then withdrawn (time =  8.75  min) from the patient and the procedure completed. COMPLICATIONS:  None  ENDOSCOPIC IMPRESSION: 1) Moderate diverticulosis in the sigmoid to transverse colon 2) 4 mm sessile polyp in the ascending colon 3) Internal hemorrhoids  RECOMMENDATIONS: 1) Await pathology results 2) High fiber diet with liberal fluid intake. 3) Repeat Colonoscopy in 5 years.  Venita Lick. Russella Dar, MD,  Clementeen Graham  n. eSIGNED:   Venita Lick. [place] at 06/10/2012 10:05 AM  Idamae Lusher, [phone]

## 2012-06-13 ENCOUNTER — Telehealth: Payer: Self-pay | Admitting: *Deleted

## 2012-06-13 NOTE — Telephone Encounter (Signed)
  Follow up Call-  Call back number 06/10/2012  Post procedure Call Back phone  # [phone]  Permission to leave phone message Yes     Called number several times and line was busy

## 2012-06-16 ENCOUNTER — Encounter: Payer: Self-pay | Admitting: Gastroenterology

## 2012-06-17 ENCOUNTER — Encounter (HOSPITAL_COMMUNITY): Payer: Self-pay

## 2012-06-17 ENCOUNTER — Encounter (HOSPITAL_COMMUNITY)
Admission: RE | Admit: 2012-06-17 | Discharge: 2012-06-17 | Disposition: A | Discharge status: Home / Self Care | Payer: BC Managed Care – PPO | Source: Ambulatory Visit | Attending: Orthopedic Surgery | Admitting: Orthopedic Surgery

## 2012-06-17 HISTORY — DX: Personal history of colon polyps, unspecified: Z86.0100

## 2012-06-17 HISTORY — DX: Personal history of colonic polyps: Z86.010

## 2012-06-17 LAB — BASIC METABOLIC PANEL
BUN: 22 mg/dL (ref 6–23)
CO2: 25 mEq/L (ref 19–32)
Calcium: 9.6 mg/dL (ref 8.4–10.5)
Chloride: 103 mEq/L (ref 96–112)
Creatinine, Ser: 0.88 mg/dL (ref 0.50–1.35)

## 2012-06-17 LAB — CBC
HCT: 42.1 % (ref 39.0–52.0)
MCHC: 33.7 g/dL (ref 30.0–36.0)
MCV: 89.6 fL (ref 78.0–100.0)
Platelets: 202 10*3/uL (ref 150–400)
RDW: 12.9 % (ref 11.5–15.5)
WBC: 7.7 10*3/uL (ref 4.0–10.5)

## 2012-06-17 NOTE — Progress Notes (Signed)
Pt doesn't have a cardiologist  Denies ever having a stress test/heart cath Echo done a couple of yrs ago   Medical MD is Dr.David Bus[facility]  Denies any recent ekg or cxr's

## 2012-06-17 NOTE — Pre-Procedure Instructions (Signed)
20 KENTRELL HALLAHAN  06/17/2012   Your procedure is scheduled on:  Thurs, Aug 22 @ 12:30 PM  Report to [facility] at 10:30 AM.  Call this number if you have problems the morning of surgery: [phone]   Remember:   Do not eat food:After Midnight.    Take these medicines the morning of surgery with A SIP OF WATER: Amlodipine(Norvasc),Doxazosin(Cardura), and Pain Pill(if needed)   Do not wear jewelry  Do not wear lotions, powders, or colognes  Men may shave face and neck.  Do not bring valuables to the hospital.  Contacts, dentures or bridgework may not be worn into surgery.  Leave suitcase in the car. After surgery it may be brought to your room.  For patients admitted to the hospital, checkout time is 11:00 AM the day of discharge.   Patients discharged the day of surgery will not be allowed to drive home.  Special Instructions: CHG Shower Use Special Wash: 1/2 bottle night before surgery and 1/2 bottle morning of surgery.   Please read over the following fact sheets that you were given: Pain Booklet, Coughing and Deep Breathing, MRSA Information and Surgical Site Infection Prevention

## 2012-06-22 MED ORDER — CEFAZOLIN SODIUM-DEXTROSE 2-3 GM-% IV SOLR
2.0000 g | INTRAVENOUS | Status: AC
  Administered 2012-06-23: 1 g via INTRAVENOUS
  Administered 2012-06-23: 2 g via INTRAVENOUS
  Filled 2012-06-22: qty 50

## 2012-06-23 ENCOUNTER — Encounter (HOSPITAL_COMMUNITY): Payer: Self-pay | Admitting: Anesthesiology

## 2012-06-23 ENCOUNTER — Ambulatory Visit (HOSPITAL_COMMUNITY): Payer: BC Managed Care – PPO | Admitting: Anesthesiology

## 2012-06-23 ENCOUNTER — Ambulatory Visit (HOSPITAL_COMMUNITY)
Admission: RE | Admit: 2012-06-23 | Discharge: 2012-06-24 | Disposition: A | Discharge status: Home / Self Care | Payer: BC Managed Care – PPO | Source: Ambulatory Visit | Attending: Orthopedic Surgery | Admitting: Orthopedic Surgery

## 2012-06-23 ENCOUNTER — Encounter (HOSPITAL_COMMUNITY): Payer: Self-pay | Admitting: *Deleted

## 2012-06-23 ENCOUNTER — Ambulatory Visit (HOSPITAL_COMMUNITY): Payer: BC Managed Care – PPO

## 2012-06-23 ENCOUNTER — Encounter (HOSPITAL_COMMUNITY)
Admission: RE | Disposition: A | Discharge status: Home / Self Care | Payer: Self-pay | Source: Ambulatory Visit | Attending: Orthopedic Surgery

## 2012-06-23 DIAGNOSIS — Z01818 Encounter for other preprocedural examination: Secondary | ICD-10-CM | POA: Insufficient documentation

## 2012-06-23 DIAGNOSIS — Y838 Other surgical procedures as the cause of abnormal reaction of the patient, or of later complication, without mention of misadventure at the time of the procedure: Secondary | ICD-10-CM | POA: Insufficient documentation

## 2012-06-23 DIAGNOSIS — I10 Essential (primary) hypertension: Secondary | ICD-10-CM | POA: Insufficient documentation

## 2012-06-23 DIAGNOSIS — Z79899 Other long term (current) drug therapy: Secondary | ICD-10-CM | POA: Insufficient documentation

## 2012-06-23 DIAGNOSIS — Z01811 Encounter for preprocedural respiratory examination: Secondary | ICD-10-CM | POA: Insufficient documentation

## 2012-06-23 DIAGNOSIS — M960 Pseudarthrosis after fusion or arthrodesis: Secondary | ICD-10-CM

## 2012-06-23 DIAGNOSIS — Z01812 Encounter for preprocedural laboratory examination: Secondary | ICD-10-CM | POA: Insufficient documentation

## 2012-06-23 DIAGNOSIS — Z01810 Encounter for preprocedural cardiovascular examination: Secondary | ICD-10-CM | POA: Insufficient documentation

## 2012-06-23 DIAGNOSIS — K219 Gastro-esophageal reflux disease without esophagitis: Secondary | ICD-10-CM | POA: Insufficient documentation

## 2012-06-23 DIAGNOSIS — T8489XA Other specified complication of internal orthopedic prosthetic devices, implants and grafts, initial encounter: Principal | ICD-10-CM | POA: Insufficient documentation

## 2012-06-23 HISTORY — PX: FOOT ARTHRODESIS: SHX1655

## 2012-06-23 HISTORY — PX: HARDWARE REMOVAL: SHX979

## 2012-06-23 SURGERY — REMOVAL, HARDWARE
Anesthesia: General | Site: Foot | Laterality: Left

## 2012-06-23 MED ORDER — ONDANSETRON HCL 4 MG/2ML IJ SOLN: 4.0000 mg | Freq: Once | INTRAMUSCULAR | Status: DC | PRN

## 2012-06-23 MED ORDER — LIDOCAINE HCL (CARDIAC) 20 MG/ML IV SOLN
INTRAVENOUS | Status: DC | PRN
  Administered 2012-06-23: 100 mg via INTRAVENOUS

## 2012-06-23 MED ORDER — DOXAZOSIN MESYLATE 4 MG PO TABS
4.0000 mg | ORAL_TABLET | Freq: Every day | ORAL | Status: DC
  Filled 2012-06-23 (×2): qty 1

## 2012-06-23 MED ORDER — HYDROMORPHONE HCL PF 1 MG/ML IJ SOLN
0.2500 mg | INTRAMUSCULAR | Status: DC | PRN
  Administered 2012-06-23 (×4): 0.5 mg via INTRAVENOUS

## 2012-06-23 MED ORDER — ONDANSETRON HCL 4 MG PO TABS: 4.0000 mg | ORAL_TABLET | Freq: Four times a day (QID) | ORAL | Status: DC | PRN

## 2012-06-23 MED ORDER — METHOCARBAMOL 100 MG/ML IJ SOLN
500.0000 mg | Freq: Four times a day (QID) | INTRAVENOUS | Status: DC | PRN
  Filled 2012-06-23: qty 5

## 2012-06-23 MED ORDER — 0.9 % SODIUM CHLORIDE (POUR BTL) OPTIME
TOPICAL | Status: DC | PRN
  Administered 2012-06-23: 1000 mL

## 2012-06-23 MED ORDER — CEFAZOLIN SODIUM 1-5 GM-% IV SOLN
INTRAVENOUS | Status: AC
  Filled 2012-06-23: qty 50

## 2012-06-23 MED ORDER — BACITRACIN ZINC 500 UNIT/GM EX OINT
TOPICAL_OINTMENT | CUTANEOUS | Status: AC
  Filled 2012-06-23: qty 15

## 2012-06-23 MED ORDER — MEPERIDINE HCL 25 MG/ML IJ SOLN
12.5000 mg | Freq: Once | INTRAMUSCULAR | Status: AC
  Administered 2012-06-23: 12.5 mg via INTRAVENOUS

## 2012-06-23 MED ORDER — ZOLPIDEM TARTRATE 5 MG PO TABS: 5.0000 mg | ORAL_TABLET | Freq: Every evening | ORAL | Status: DC | PRN

## 2012-06-23 MED ORDER — PROPOFOL 10 MG/ML IV EMUL
INTRAVENOUS | Status: DC | PRN
  Administered 2012-06-23: 200 mg via INTRAVENOUS

## 2012-06-23 MED ORDER — SUFENTANIL CITRATE 50 MCG/ML IV SOLN
INTRAVENOUS | Status: DC | PRN
  Administered 2012-06-23 (×2): 5 ug via INTRAVENOUS
  Administered 2012-06-23: 10 ug via INTRAVENOUS
  Administered 2012-06-23: 5 ug via INTRAVENOUS
  Administered 2012-06-23 (×2): 10 ug via INTRAVENOUS
  Administered 2012-06-23: 5 ug via INTRAVENOUS
  Administered 2012-06-23: 10 ug via INTRAVENOUS

## 2012-06-23 MED ORDER — OXYCODONE HCL 5 MG PO TABS
5.0000 mg | ORAL_TABLET | ORAL | Status: DC | PRN
  Administered 2012-06-23 – 2012-06-24 (×4): 15 mg via ORAL
  Filled 2012-06-23 (×5): qty 3

## 2012-06-23 MED ORDER — SODIUM CHLORIDE 0.9 % IV SOLN: INTRAVENOUS | Status: DC

## 2012-06-23 MED ORDER — HYDROMORPHONE HCL PF 1 MG/ML IJ SOLN
INTRAMUSCULAR | Status: AC
  Filled 2012-06-23: qty 1

## 2012-06-23 MED ORDER — HYDROMORPHONE HCL PF 1 MG/ML IJ SOLN
1.0000 mg | INTRAMUSCULAR | Status: DC | PRN
  Administered 2012-06-24 (×3): 1 mg via INTRAVENOUS
  Filled 2012-06-23 (×4): qty 1

## 2012-06-23 MED ORDER — AMLODIPINE BESYLATE 10 MG PO TABS
10.0000 mg | ORAL_TABLET | Freq: Every day | ORAL | Status: DC
  Administered 2012-06-24: 10 mg via ORAL
  Filled 2012-06-23: qty 1

## 2012-06-23 MED ORDER — METHOCARBAMOL 500 MG PO TABS
500.0000 mg | ORAL_TABLET | Freq: Four times a day (QID) | ORAL | Status: DC | PRN
  Administered 2012-06-23 – 2012-06-24 (×2): 500 mg via ORAL
  Filled 2012-06-23 (×2): qty 1

## 2012-06-23 MED ORDER — DOCUSATE SODIUM 100 MG PO CAPS
100.0000 mg | ORAL_CAPSULE | Freq: Two times a day (BID) | ORAL | Status: DC
  Administered 2012-06-24: 100 mg via ORAL
  Filled 2012-06-23 (×3): qty 1

## 2012-06-23 MED ORDER — METOCLOPRAMIDE HCL 5 MG/ML IJ SOLN: 5.0000 mg | Freq: Three times a day (TID) | INTRAMUSCULAR | Status: DC | PRN

## 2012-06-23 MED ORDER — CHLORHEXIDINE GLUCONATE 4 % EX LIQD: 60.0000 mL | Freq: Once | CUTANEOUS | Status: DC

## 2012-06-23 MED ORDER — ONDANSETRON HCL 4 MG/2ML IJ SOLN: 4.0000 mg | Freq: Four times a day (QID) | INTRAMUSCULAR | Status: DC | PRN

## 2012-06-23 MED ORDER — MEPERIDINE HCL 25 MG/ML IJ SOLN
INTRAMUSCULAR | Status: AC
  Filled 2012-06-23: qty 1

## 2012-06-23 MED ORDER — MIDAZOLAM HCL 5 MG/5ML IJ SOLN
INTRAMUSCULAR | Status: DC | PRN
  Administered 2012-06-23: 2 mg via INTRAVENOUS

## 2012-06-23 MED ORDER — LACTATED RINGERS IV SOLN
INTRAVENOUS | Status: DC | PRN
  Administered 2012-06-23: 13:00:00 via INTRAVENOUS

## 2012-06-23 MED ORDER — BACITRACIN ZINC 500 UNIT/GM EX OINT
TOPICAL_OINTMENT | CUTANEOUS | Status: DC | PRN
  Administered 2012-06-23: 1 via TOPICAL

## 2012-06-23 MED ORDER — BUPIVACAINE-EPINEPHRINE PF 0.5-1:200000 % IJ SOLN
INTRAMUSCULAR | Status: DC | PRN
  Administered 2012-06-23: 30 mL

## 2012-06-23 MED ORDER — METOCLOPRAMIDE HCL 10 MG PO TABS: 5.0000 mg | ORAL_TABLET | Freq: Three times a day (TID) | ORAL | Status: DC | PRN

## 2012-06-23 MED ORDER — ONDANSETRON HCL 4 MG/2ML IJ SOLN
INTRAMUSCULAR | Status: DC | PRN
  Administered 2012-06-23: 4 mg via INTRAVENOUS

## 2012-06-23 MED ORDER — SENNA 8.6 MG PO TABS
1.0000 | ORAL_TABLET | Freq: Two times a day (BID) | ORAL | Status: DC
  Administered 2012-06-24: 8.6 mg via ORAL
  Filled 2012-06-23 (×3): qty 1

## 2012-06-23 MED ORDER — LIDOCAINE-EPINEPHRINE (PF) 1.5 %-1:200000 IJ SOLN
INTRAMUSCULAR | Status: DC | PRN
  Administered 2012-06-23: 30 mL

## 2012-06-23 MED ORDER — ENOXAPARIN SODIUM 40 MG/0.4ML ~~LOC~~ SOLN
40.0000 mg | SUBCUTANEOUS | Status: DC
  Administered 2012-06-24: 40 mg via SUBCUTANEOUS
  Filled 2012-06-23 (×2): qty 0.4

## 2012-06-23 MED ORDER — DIPHENHYDRAMINE HCL 12.5 MG/5ML PO ELIX: 12.5000 mg | ORAL_SOLUTION | ORAL | Status: DC | PRN

## 2012-06-23 SURGICAL SUPPLY — 69 items
5mm x 110 mm TI- double lead cortical screw ×3 IMPLANT
BANDAGE ESMARK 6X9 LF (GAUZE/BANDAGES/DRESSINGS) ×2 IMPLANT
BEAD TIP GUIDEWIRE ×3 IMPLANT
BIT DRILL CALIBRATED 4.3X320MM (BIT) ×2 IMPLANT
BLADE SURG 10 STRL SS (BLADE) ×3 IMPLANT
BNDG COHESIVE 4X5 TAN STRL (GAUZE/BANDAGES/DRESSINGS) ×3 IMPLANT
BNDG COHESIVE 6X5 TAN STRL LF (GAUZE/BANDAGES/DRESSINGS) ×3 IMPLANT
BNDG ESMARK 6X9 LF (GAUZE/BANDAGES/DRESSINGS) ×3
CHLORAPREP W/TINT 26ML (MISCELLANEOUS) ×3 IMPLANT
CLOTH BEACON ORANGE TIMEOUT ST (SAFETY) ×3 IMPLANT
COVER SURGICAL LIGHT HANDLE (MISCELLANEOUS) ×3 IMPLANT
CUFF TOURNIQUET SINGLE 34IN LL (TOURNIQUET CUFF) ×3 IMPLANT
CUFF TOURNIQUET SINGLE 44IN (TOURNIQUET CUFF) IMPLANT
DRAPE C-ARM 42X72 X-RAY (DRAPES) ×3 IMPLANT
DRAPE U-SHAPE 47X51 STRL (DRAPES) ×3 IMPLANT
DRILL CALIBRATED 4.3X320MM (BIT) ×3
DRSG ADAPTIC 3X8 NADH LF (GAUZE/BANDAGES/DRESSINGS) ×3 IMPLANT
DRSG PAD ABDOMINAL 8X10 ST (GAUZE/BANDAGES/DRESSINGS) ×3 IMPLANT
ELECT REM PT RETURN 9FT ADLT (ELECTROSURGICAL) ×3
ELECTRODE REM PT RTRN 9FT ADLT (ELECTROSURGICAL) ×2 IMPLANT
GLOVE BIO SURGEON STRL SZ 6.5 (GLOVE) ×3 IMPLANT
GLOVE BIO SURGEON STRL SZ8 (GLOVE) ×3 IMPLANT
GLOVE BIOGEL PI IND STRL 6.5 (GLOVE) ×4 IMPLANT
GLOVE BIOGEL PI IND STRL 7.0 (GLOVE) ×6 IMPLANT
GLOVE BIOGEL PI IND STRL 7.5 (GLOVE) ×4 IMPLANT
GLOVE BIOGEL PI IND STRL 8 (GLOVE) ×2 IMPLANT
GLOVE BIOGEL PI INDICATOR 6.5 (GLOVE) ×2
GLOVE BIOGEL PI INDICATOR 7.0 (GLOVE) ×3
GLOVE BIOGEL PI INDICATOR 7.5 (GLOVE) ×2
GLOVE BIOGEL PI INDICATOR 8 (GLOVE) ×1
GOWN PREVENTION PLUS XLARGE (GOWN DISPOSABLE) ×3 IMPLANT
GOWN STRL NON-REIN LRG LVL3 (GOWN DISPOSABLE) ×6 IMPLANT
IMPLANT OP-1 (Orthopedic Implant) ×3 IMPLANT
KIT BASIN OR (CUSTOM PROCEDURE TRAY) ×3 IMPLANT
KIT ROOM TURNOVER OR (KITS) ×3 IMPLANT
LAG SCREW GUIDE WIRE  CAT # 27914 ×3 IMPLANT
MANIFOLD NEPTUNE II (INSTRUMENTS) ×3 IMPLANT
NAIL LOCK ANKLE ANTE 12X210 (Nail) ×3 IMPLANT
NEEDLE 22X1 1/2 (OR ONLY) (NEEDLE) IMPLANT
NS IRRIG 1000ML POUR BTL (IV SOLUTION) ×3 IMPLANT
PACK ORTHO EXTREMITY (CUSTOM PROCEDURE TRAY) ×3 IMPLANT
PAD ARMBOARD 7.5X6 YLW CONV (MISCELLANEOUS) ×6 IMPLANT
PAD CAST 4YDX4 CTTN HI CHSV (CAST SUPPLIES) ×2 IMPLANT
PADDING CAST COTTON 4X4 STRL (CAST SUPPLIES) ×1
PADDING CAST COTTON 6X4 STRL (CAST SUPPLIES) ×3 IMPLANT
SCREW CORT TI DBL LEAD 5X24 (Screw) ×3 IMPLANT
SCREW CORT TI DBL LEAD 5X42 (Screw) ×3 IMPLANT
SCREW CORT TI DBL LEAD 5X65 (Screw) ×3 IMPLANT
SCREW CORT TI DBLE LEAD 5X28 (Screw) ×6 IMPLANT
SOAP 2 % CHG 4 OZ (WOUND CARE) ×3 IMPLANT
SPONGE GAUZE 4X4 12PLY (GAUZE/BANDAGES/DRESSINGS) ×3 IMPLANT
SPONGE LAP 18X18 X RAY DECT (DISPOSABLE) ×6 IMPLANT
STAPLER VISISTAT 35W (STAPLE) IMPLANT
SUCTION FRAZIER TIP 10 FR DISP (SUCTIONS) ×3 IMPLANT
SUT MNCRL AB 3-0 PS2 18 (SUTURE) ×3 IMPLANT
SUT PROLENE 3 0 PS 2 (SUTURE) ×6 IMPLANT
SUT VIC AB 0 CT1 27 (SUTURE) ×1
SUT VIC AB 0 CT1 27XBRD ANBCTR (SUTURE) ×2 IMPLANT
SUT VIC AB 2-0 CT1 27 (SUTURE) ×1
SUT VIC AB 2-0 CT1 TAPERPNT 27 (SUTURE) ×2 IMPLANT
SUT VIC AB 3-0 PS2 18 (SUTURE)
SUT VIC AB 3-0 PS2 18XBRD (SUTURE) IMPLANT
SYR 3ML 25GX5/8 SAFETY (SYRINGE) ×3 IMPLANT
SYR 3ML LL SCALE MARK (SYRINGE) ×3 IMPLANT
SYR CONTROL 10ML LL (SYRINGE) IMPLANT
TOWEL OR 17X24 6PK STRL BLUE (TOWEL DISPOSABLE) ×3 IMPLANT
TOWEL OR 17X26 10 PK STRL BLUE (TOWEL DISPOSABLE) ×3 IMPLANT
TUBE CONNECTING 12X1/4 (SUCTIONS) ×3 IMPLANT
WATER STERILE IRR 1000ML POUR (IV SOLUTION) ×3 IMPLANT

## 2012-06-23 NOTE — Anesthesia Procedure Notes (Signed)
Anesthesia Regional Block:  Popliteal block  Pre-Anesthetic Checklist: ,, timeout performed, Correct Patient, Correct Site, Correct Laterality, Correct Procedure, Correct Position, site marked, Risks and benefits discussed,  Surgical consent,  Pre-op evaluation,  At surgeon's request and post-op pain management  Laterality: Left  Prep: chloraprep       Needles:  Injection technique: Single-shot  Needle Type: Echogenic Stimulator Needle     Needle Length:cm 9 cm Needle Gauge: 21 G    Additional Needles:  Procedures: ultrasound guided and nerve stimulator Popliteal block  Nerve Stimulator or Paresthesia:  Response: 0.4 mA,   Additional Responses:   Narrative:  Start time: 06/23/2012 1:00 PM End time: 06/23/2012 1:20 PM Injection made incrementally with aspirations every 5 mL.  Performed by: Personally  Anesthesiologist: Arta Bruce MD  Additional Notes: Monitors applied. Patient sedated. Sterile prep and drape,hand hygiene and sterile gloves were used. Relevant anatomy identified.Needle position confirmed.Local anesthetic injected incrementally after negative aspiration. Local anesthetic spread visualized around nerve(s). Vascular puncture avoided. No complications. Image printed for medical record.The patient tolerated the procedure well.  Additional Saphenous nerve block performed. 15cc Local Anesthetic mixture placed under ultrasonic guidance along the medio-inferior border of the Sartorious muscle 6 inches above the knee.  No Problems encountered.  Arta Bruce MD   Popliteal block

## 2012-06-23 NOTE — Anesthesia Postprocedure Evaluation (Signed)
Anesthesia Post Note  Patient: [name]  Procedure(s) Performed: Procedure(s) (LRB): HARDWARE REMOVAL (Left) ARTHRODESIS FOOT (Left)  Anesthesia type: general  Patient location: PACU  Post pain: Pain level controlled  Post assessment: Patient's Cardiovascular Status Stable  Last Vitals:  Filed Vitals:   06/23/12 1800  BP:   Pulse: 67  Temp:   Resp: 20    Post vital signs: Reviewed and stable  Level of consciousness: sedated  Complications: No apparent anesthesia complications

## 2012-06-23 NOTE — Brief Op Note (Signed)
06/23/2012  5:23 PM  PATIENT:  [name]  56 y.o. male  PRE-OPERATIVE DIAGNOSIS:  left subtalar and talonavicular non union   POST-OPERATIVE DIAGNOSIS:  left subtalar and talonavicular non union   Procedure(s): 1.  Removal of deep implants from left leg 2.  Removal of deep implants from left ankle 3.  Removal of deep implants from left foot 4.  Revision subtalar arthrodesis 5.  Revision ankle arthrodesis 6.  Fluoro > 1 hour  SURGEON:  Toni Arthurs, MD  ASSISTANT: n/a  ANESTHESIA:   General, regional  EBL:  minimal   TOURNIQUET:   Total Tourniquet Time Documented: Thigh (Left) - 151 minutes  COMPLICATIONS:  None apparent  DISPOSITION:  Extubated, awake and stable to recovery.  DICTATION ID:   [id number]

## 2012-06-23 NOTE — Transfer of Care (Signed)
Immediate Anesthesia Transfer of Care Note  Patient: [name]  Procedure(s) Performed: Procedure(s) (LRB): HARDWARE REMOVAL (Left) ARTHRODESIS FOOT (Left)  Patient Location: PACU  Anesthesia Type: General  Level of Consciousness: awake, alert , oriented and sedated  Airway & Oxygen Therapy: Patient Spontanous Breathing and Patient connected to face mask oxygen  Post-op Assessment: Report given to PACU RN, Post -op Vital signs reviewed and stable and Patient moving all extremities  Post vital signs: Reviewed and stable  Complications: No apparent anesthesia complications

## 2012-06-23 NOTE — H&P (Signed)
[name] is an 56 y.o. male.   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  He presents now for removal of hardware and revision of subtalar and ankle joint arthrodeses.  Past Medical History  Diagnosis Date  . Hypertension     takes Cardura and NOrvasc daily  . Seizures     had one approx. 30 yrs ago,has not had any since  . Arthritis     knees  . GERD (gastroesophageal reflux disease)     occasionally uses tums  . History of colon polyps     Past Surgical History  Procedure Date  . Arthroscopy knee w/ drilling bilateral    2012  . Ankle fusion left    Dec. 2012  . Joint replacement     right hip  01-2011  . Colonoscopy     Family History  Problem Relation Age of Onset  . Colon cancer Father 43  . Stomach cancer Neg Hx    Social History:  reports that he has been passively smoking Cigars.  He has never used smokeless tobacco. He reports that he drinks about 7.7 ounces of alcohol per week. He reports that he does not use illicit drugs.  Allergies: No Known Allergies  Medications Prior to Admission  Medication Sig Dispense Refill  . amLODipine (NORVASC) 10 MG tablet Take 10 mg by mouth every morning.        Marland Kitchen doxazosin (CARDURA) 4 MG tablet Take 4 mg by mouth at bedtime.       . Glucosamine-Chondroitin (MOVE FREE PO) Take 2 tablets by mouth every morning. OTC      . HYDROcodone-acetaminophen (NORCO) 10-325 MG per tablet Take 1 tablet by mouth 2 (two) times daily.       . Multiple Vitamin (MULTIVITAMIN WITH MINERALS) TABS Take 1 tablet by mouth every morning.      Marland Kitchen OVER THE COUNTER MEDICATION Take 1 capsule by mouth every morning. Stool softener      . OVER THE COUNTER MEDICATION Place 1 drop into both eyes as needed. For redness  OTC Eye Drops        No results found for this or any previous visit (from the past 48 hour(s)). No results found.  ROS no recent f/cn/v/wt  loss  Blood pressure 137/87, pulse 66, temperature 97.7 F (36.5 C), temperature source Oral, resp. rate 18, SpO2 95.00%. Physical Exam  wn wd male in nad.  A and O x 4.  Mood and affect normal.  EOMI.  Respirations unlabored.  Left ankle with healed lateral incision.  Active PF and DF at toes.  Feels LT throughout.  No lymphadneopathy.    Assessment/Plan L ankle and subtalar joint nonunions s/p TTC nail.  To OR for L LE removal of deep implants and revision of left subtalar joint and ankle joint nonunions.  Possible aspiration of left iliac crest for bone marrow.  The risks and benefits of the alternative treatment options have been discussed in detail.  The patient wishes to proceed with surgery and specifically understands risks of bleeding, infection, nerve damage, blood clots, need for additional surgery, amputation and death.   Toni Arthurs 23-Jul-2012, 1:00 PM

## 2012-06-23 NOTE — Progress Notes (Signed)
DR. Victorino Dike be called regarding [name] pain in left shin per Dr. Michelle Piper. Dr. Michelle Piper  Asked for Dr. Victorino Dike to be informed  the  block is working pt has no pain in ankle unable to wiggle toes does not feel sensation. His pain is in shin . Dr. Victorino Dike said he is not surprised that he has pain in this area according to what he did to his tibia in surgery. That the block is probably working in the lower half in the ankle area but not above that.

## 2012-06-23 NOTE — Anesthesia Preprocedure Evaluation (Signed)
Anesthesia Evaluation  Patient identified by MRN, date of birth, ID band Patient awake    Reviewed: Allergy & Precautions, H&P , Patient's Chart, lab work & pertinent test results  Airway Mallampati: I TM Distance: >3 FB Neck ROM: Full    Dental   Pulmonary          Cardiovascular hypertension, Pt. on medications     Neuro/Psych Seizures -, Well Controlled,     GI/Hepatic GERD-  Medicated and Controlled,  Endo/Other    Renal/GU      Musculoskeletal   Abdominal   Peds  Hematology   Anesthesia Other Findings   Reproductive/Obstetrics                           Anesthesia Physical Anesthesia Plan  ASA: II  Anesthesia Plan: General   Post-op Pain Management: MAC Combined w/ Regional for Post-op pain   Induction: Intravenous  Airway Management Planned: LMA  Additional Equipment:   Intra-op Plan:   Post-operative Plan: Extubation in OR  Informed Consent: I have reviewed the patients History and Physical, chart, labs and discussed the procedure including the risks, benefits and alternatives for the proposed anesthesia with the patient or authorized representative who has indicated his/her understanding and acceptance.     Plan Discussed with: CRNA and Surgeon  Anesthesia Plan Comments:         Anesthesia Quick Evaluation

## 2012-06-24 ENCOUNTER — Encounter (HOSPITAL_COMMUNITY): Payer: Self-pay | Admitting: Orthopedic Surgery

## 2012-06-24 MED ORDER — SENNA 8.6 MG PO TABS: 2.0000 | ORAL_TABLET | Freq: Two times a day (BID) | ORAL | Status: DC

## 2012-06-24 MED ORDER — OXYCODONE HCL 5 MG PO TABS: 5.0000 mg | ORAL_TABLET | ORAL | Status: AC | PRN

## 2012-06-24 MED ORDER — DSS 100 MG PO CAPS: 100.0000 mg | ORAL_CAPSULE | Freq: Two times a day (BID) | ORAL | Status: AC

## 2012-06-24 MED ORDER — OXYCODONE HCL 10 MG PO TB12
20.0000 mg | ORAL_TABLET | Freq: Once | ORAL | Status: AC
  Administered 2012-06-24: 20 mg via ORAL
  Filled 2012-06-24: qty 2

## 2012-06-24 NOTE — Op Note (Signed)
NAMEMALIKI, GIGNAC NO.:  [id number]  MEDICAL RECORD NO.:  [id number]  LOCATION:  5N06C                        FACILITY:  [facility]  PHYSICIAN:  Toni Arthurs, MD        DATE OF BIRTH:  October 15, 1956  DATE OF PROCEDURE:  06/23/2012 DATE OF DISCHARGE:                              OPERATIVE REPORT   PREOPERATIVE DIAGNOSIS:  Left subtalar and talonavicular joint, nonunion, status post tibiotalocalcaneal arthrodesis.  POSTOPERATIVE DIAGNOSIS:  Left subtalar and talonavicular joint, nonunion, status post tibiotalocalcaneal arthrodesis.  PROCEDURE: 1. Removal of deep implant, left leg. 2. Removal of deep implant, left ankle. 3. Removal of deep implants from the left foot. 4. Revision of subtalar arthrodesis. 5. Revision of ankle arthrodesis. 6. Intraoperative interpretation of fluoroscopic imaging greater than     1 hour.  SURGEON:  Toni Arthurs, MD  ANESTHESIA:  General, regional.  ESTIMATED BLOOD LOSS:  50 mL.  TOURNIQUET TIME:  2-1/2 hours at 250 mmHg.  COMPLICATIONS:  None apparent.  DISPOSITION:  Extubated, awake and stable to recovery.  INDICATION FOR PROCEDURE:  The patient is a 56 year old male without significant past medical history.  He underwent tibiotalocalcaneal arthrodesis in January of this year.  He presented to me back in the spring with continued pain and swelling about the ankle.  He was found on CT scan to have a nonunion of the tibiotalar joint as well as a nonunion of the subtalar joint.  He presents now for removal of the TTC nail and its related hardware and revision arthrodesis of the subtalar and tibiotalar joints.  He understands the risks, benefits, and the alternative treatment options and elects surgical treatment.  He specifically understands risks of bleeding, infection, nerve damage, blood clots, need for additional surgery, amputation, and death.  PROCEDURE IN DETAIL:  After preoperative consent was obtained and the correct  operative site was identified, the patient was brought to the operating room and placed supine on the operating table.  General anesthesia was induced.  Preoperative antibiotics were administered. Surgical time-out was taken.  The left lower extremity was prepped and draped in standard sterile fashion.  The extremity was exsanguinated and tourniquet was inflated to 250 mmHg.  The patient's previous plantar scar was identified.  This was incised and blunt dissection was carried down to the tip of the nail.  A curette was used to clean soft tissue out of the tip of the nail.  A threaded extraction bolt was then placed into the nail and tightened securely.  Attention was then turned to the lateral aspect of the leg.  An AP fluoroscopic image was obtained localizing the head of the interlocking screw.  A stab incision was made over this location and blunt dissection was carried down to the head of the screw.  The screw was withdrawn without difficulty.  Attention was then turned to the lateral aspect of the ankle at the patient's previous surgical site.  The incision was made again and sharp dissection was carried down through the subcutaneous tissue to the level of the lateral aspect of the talus.  The head of the screw was identified and exposed.  The screw was removed in  its entirety.  Attention was then turned to the posterior aspect of the heel.  The patient's previous stab incision was identified.  This scar was incised. Sharp dissection was carried down through the skin and subcutaneous tissue.  The head of the screw was identified, it was cleaned of all soft tissues.  The screw was removed in its entirety without difficulty. At this point, the slap hammer was placed on the extraction bolt and the nail was removed without difficulty.  Attention was then returned to the lateral incision.  The tibiotalar and subtalar joints were exposed.  The lamina spreader was inserted into  the subtalar joint.  A curette was used to remove all of the cartilage on both sides of the joint.  Wound was irrigated copiously.  The subchondral bone of the subtalar joint was then perforated with an 11/64th inch drill bit on both sides of the joint.  A quarter-inch curved osteotome was then used to break up the remaining subchondral bone between the drill holes.  Attention was then turned to the tibiotalar joint.  The nonunion site was taken down.  All fibrous tissue was removed.  The wound was irrigated copiously.  The joint surfaces were then perforated with the 11/64th inch drill bit and the subchondral bone on both sides of the joint were broken up with a quarter-inch curved osteotome.  Both joints were then packed with OP-1.  The subtalar joint was reduced and provisionally pinned.  Starting guide pin was then inserted through the plantar incision, medial to the initial entry point.  The guide pin was moved more medial in an effort to promote increased hindfoot valgus.  The guide pin was advanced into the talus and then into the tibia.  An 8 mm starting reamer was inserted over this guide pin, and used to open the plantar cortex of the calcaneus.  The guide pin was then switched out for only a ball-tip guidewire.  AP and lateral fluoroscopic images confirmed appropriate position of the guidewire was in the tibial canal.  The ball- tipped guide pin was then sequentially reamed to a diameter of 13 mm. It was reamed to the isthmus of the tibia.  A 12 mm x 210 mm Bi[employer] [place] TTC nail was then inserted, taking care to position the hind foot in appropriate valgus.  It was countersunk in several millimeters. The targeting guide was then used to locate the medial and lateral screw at the level of the talar body.  A drill sleeve was inserted down to a point flush with the lateral aspect of the talus.  The 3.2 mm drill bit was then used to drill through the central portion of the  talus taking care to avoid the previous hole in the talus.  The 5 mm interlock screw was inserted and noted to have excellent purchase.  Attention was then turned to the proximal end of the nail, where the targeting jig was used to insert two 5 mm screws through the nail from medial to lateral in bicortical fashion.  At this point, the compressing screwdriver was inserted into the distal end of the nail.  The compression screw was tightened, advancing the screw and talus several millimeters and compressing the tibiotalar joint appropriately.  The targeting guide was then used to insert the posterior to anterior screw through a stab incision in the posterior aspect of the heel.  AP and lateral fluoroscopic images showed appropriate position and length of all of the interlocking screws in appropriate position  and length of the nail.  On gross examination, the hind foot was positioned appropriately in valgus.  The 3.2 mm drill bit was then used to drill a hole between the posterior-inferior aspect of the calcaneus across the subtalar joint, and across the tibiotalar joint and out through the anterior aspect of the tibia.  AP and lateral fluoroscopic images confirmed appropriate position of this drill hole.  A 110 mm x 5 mm screw was inserted across the subtalar and ankle joints.  Final AP and lateral fluoroscopic images were obtained showing appropriate position and length of all hardware and appropriate reduction of both the ankle and subtalar joints.  The wounds were again irrigated copiously.  Inverted simple sutures of 0 Vicryl were used to close the subcutaneous tissue over the lateral aspect of the ankle subtalar joints.  The subcutaneous tissues were approximated with inverted simple sutures of 3-0 Monocryl. A running 3-0 Prolene was used to close the skin incisions.  The other stab incisions were all closed with horizontal mattress sutures of 3-0 Prolene.  Sterile dressings were  applied followed by a well-padded short- leg splint.  The tourniquet had been released in 2-1/2 hours and hemostasis achieved prior to closure.  The patient will be observed overnight for pain control.  He will be followed up in the office in 2 weeks for conversion to a cast.     Toni Arthurs, MD     JH/MEDQ  D:  06/23/2012  T:  06/24/2012  Job:  [id number]

## 2012-06-24 NOTE — Discharge Summary (Signed)
Physician Discharge Summary  Patient ID: [name] MRN: [id number] DOB/AGE: 1956-02-13 56 y.o.  Admit date: 06/23/2012 Discharge date: 06/24/2012  Admission Diagnoses:  Left ankle and subtalar nonunion s/p arthrodesis  Discharge Diagnoses:  Active Problems:  * No active hospital problems. *  same  Discharged Condition: stable  Hospital Course: Pt was admitted and underwent surgery.  He is discharged to home in stable condition today.  Consults: None  Significant Diagnostic Studies: none  Treatments: surgery: revision subtalar and ankle arthrodeses  Discharge Exam: Blood pressure 152/83, pulse 94, temperature 99.5 F (37.5 C), temperature source Oral, resp. rate 16, SpO2 93.00%. L lE splinted.  NVI at toes.  Disposition: 01-Home or Self Care  Discharge Orders    Future Orders Please Complete By Expires   Diet - low sodium heart healthy      Call MD / Call 911      Comments:   If you experience chest pain or shortness of breath, CALL 911 and be transported to the hospital emergency room.  If you develope a fever above 101 F, pus (white drainage) or increased drainage or redness at the wound, or calf pain, call your surgeon's office.   Constipation Prevention      Comments:   Drink plenty of fluids.  Prune juice may be helpful.  You may use a stool softener, such as Colace (over the counter) 100 mg twice a day.  Use MiraLax (over the counter) for constipation as needed.   Increase activity slowly as tolerated      Non weight bearing        Medication List  As of 06/24/2012  7:36 AM   STOP taking these medications         HYDROcodone-acetaminophen 10-325 MG per tablet         TAKE these medications         doxazosin 4 MG tablet   Commonly known as: CARDURA   Take 4 mg by mouth at bedtime.      DSS 100 MG Caps   Take 100 mg by mouth 2 (two) times daily.      MOVE FREE PO   Take 2 tablets by mouth every morning. OTC      multivitamin with minerals Tabs   Take 1 tablet by mouth every morning.      NORVASC 10 MG tablet   Generic drug: amLODipine   Take 10 mg by mouth every morning.      OVER THE COUNTER MEDICATION   Take 1 capsule by mouth every morning. Stool softener      OVER THE COUNTER MEDICATION   Place 1 drop into both eyes as needed. For redness    OTC Eye Drops      oxyCODONE 5 MG immediate release tablet   Commonly known as: Oxy IR/ROXICODONE   Take 1-2 tablets (5-10 mg total) by mouth every 4 (four) hours as needed.      senna 8.6 MG Tabs   Commonly known as: SENOKOT   Take 2 tablets (17.2 mg total) by mouth 2 (two) times daily.           Follow-up Information    Follow up with Torii Royse, Jonny Ruiz, MD. Schedule an appointment as soon as possible for a visit in 2 weeks.   Contact information:   [address], [address] [place] [address] [phone]          Signed: Toni Arthurs 06/24/2012, 7:36 AM

## 2012-06-24 NOTE — Care Management Note (Signed)
    Page 1 of 1   06/24/2012     11:30:38 AM   CARE MANAGEMENT NOTE 06/24/2012  Patient:  [name], [name]   Account Number:  [id number]  Date Initiated:  06/24/2012  Documentation initiated by:  Anette Guarneri  Subjective/Objective Assessment:   POD#1 s/p removal of hardware and revision left ankle arthrodesis  lives at home with wife, has crutches     Action/Plan:   home with self care   Anticipated DC Date:  06/24/2012   Anticipated DC Plan:        DC Planning Services  CM consult      Choice offered to / List presented to:             Status of service:  Completed, signed off Medicare Important Message given?  NO (If response is "NO", the following Medicare IM given date fields will be blank) Date Medicare IM given:   Date Additional Medicare IM given:    Discharge Disposition:  HOME/SELF CARE  Per UR Regulation:  Reviewed for med. necessity/level of care/duration of stay  If discussed at Long Length of Stay Meetings, dates discussed:    Comments:  06/24/12  11:28 Anette Guarneri RN/CM Spoke with patient and wife regarding d/c needs. Per patient and wife he has crutches, does not need any DME No Home health services needed

## 2012-06-24 NOTE — Progress Notes (Signed)
Patient is ready for discharge.  Patient states that he does not need wheelchair assistance and is able to walk off unit. Discharge instructions and paperwork has been provided.

## 2012-06-29 ENCOUNTER — Encounter (HOSPITAL_COMMUNITY): Payer: Self-pay

## 2012-10-03 ENCOUNTER — Other Ambulatory Visit (HOSPITAL_COMMUNITY): Payer: Self-pay | Admitting: Nurse Practitioner

## 2012-10-03 DIAGNOSIS — R42 Dizziness and giddiness: Secondary | ICD-10-CM

## 2012-10-03 DIAGNOSIS — R011 Cardiac murmur, unspecified: Secondary | ICD-10-CM

## 2012-10-10 ENCOUNTER — Ambulatory Visit (HOSPITAL_COMMUNITY): Payer: Self-pay

## 2012-10-10 ENCOUNTER — Encounter (HOSPITAL_COMMUNITY): Payer: Self-pay

## 2012-10-28 ENCOUNTER — Encounter (HOSPITAL_COMMUNITY): Payer: Self-pay

## 2012-10-28 ENCOUNTER — Ambulatory Visit (HOSPITAL_COMMUNITY): Payer: Self-pay

## 2012-11-25 ENCOUNTER — Ambulatory Visit (HOSPITAL_COMMUNITY)
Admission: RE | Admit: 2012-11-25 | Discharge: 2012-11-25 | Disposition: A | Discharge status: Home / Self Care | Payer: BC Managed Care – PPO | Source: Ambulatory Visit | Attending: Cardiovascular Disease | Admitting: Cardiovascular Disease

## 2012-11-25 DIAGNOSIS — R011 Cardiac murmur, unspecified: Secondary | ICD-10-CM

## 2012-11-25 DIAGNOSIS — I369 Nonrheumatic tricuspid valve disorder, unspecified: Secondary | ICD-10-CM | POA: Insufficient documentation

## 2012-11-25 DIAGNOSIS — I10 Essential (primary) hypertension: Secondary | ICD-10-CM | POA: Insufficient documentation

## 2012-11-25 DIAGNOSIS — R42 Dizziness and giddiness: Secondary | ICD-10-CM

## 2012-11-25 DIAGNOSIS — I059 Rheumatic mitral valve disease, unspecified: Secondary | ICD-10-CM | POA: Insufficient documentation

## 2012-11-25 NOTE — Progress Notes (Signed)
Carotid Duplex Doppler Completed. Philmore Lepore D  

## 2012-11-25 NOTE — Progress Notes (Signed)
[facility]   2D echo completed 11/25/2012.   Cindy Retal Tonkinson, RDCS   

## 2013-10-30 DIAGNOSIS — H8110 Benign paroxysmal vertigo, unspecified ear: Secondary | ICD-10-CM | POA: Insufficient documentation

## 2014-01-05 NOTE — Progress Notes (Signed)
Surgery on 01/19/14.  Preop on 01/15/14 at 1100am.  Need orders in EPIC.  Thank You.

## 2014-01-08 ENCOUNTER — Other Ambulatory Visit (HOSPITAL_COMMUNITY): Payer: Self-pay | Admitting: Orthopaedic Surgery

## 2014-01-08 NOTE — Progress Notes (Signed)
Surgery on 01/19/14.  Preop on 01/15/14 at 1100am.  Need orders in EPIC.  Thank You.  

## 2014-01-11 ENCOUNTER — Encounter (HOSPITAL_COMMUNITY): Payer: Self-pay | Admitting: Pharmacy Technician

## 2014-01-15 ENCOUNTER — Encounter (HOSPITAL_COMMUNITY)
Admission: RE | Admit: 2014-01-15 | Discharge: 2014-01-15 | Disposition: A | Discharge status: Home / Self Care | Payer: BC Managed Care – PPO | Source: Ambulatory Visit | Attending: Orthopaedic Surgery | Admitting: Orthopaedic Surgery

## 2014-01-15 ENCOUNTER — Ambulatory Visit (HOSPITAL_COMMUNITY)
Admission: RE | Admit: 2014-01-15 | Discharge: 2014-01-15 | Disposition: A | Discharge status: Home / Self Care | Payer: BC Managed Care – PPO | Source: Ambulatory Visit | Attending: Orthopaedic Surgery | Admitting: Orthopaedic Surgery

## 2014-01-15 ENCOUNTER — Encounter (HOSPITAL_COMMUNITY): Payer: Self-pay

## 2014-01-15 DIAGNOSIS — Z01810 Encounter for preprocedural cardiovascular examination: Secondary | ICD-10-CM | POA: Insufficient documentation

## 2014-01-15 DIAGNOSIS — R9431 Abnormal electrocardiogram [ECG] [EKG]: Secondary | ICD-10-CM | POA: Insufficient documentation

## 2014-01-15 DIAGNOSIS — Z01818 Encounter for other preprocedural examination: Secondary | ICD-10-CM | POA: Insufficient documentation

## 2014-01-15 DIAGNOSIS — I452 Bifascicular block: Secondary | ICD-10-CM | POA: Insufficient documentation

## 2014-01-15 DIAGNOSIS — I498 Other specified cardiac arrhythmias: Secondary | ICD-10-CM | POA: Insufficient documentation

## 2014-01-15 DIAGNOSIS — Z01812 Encounter for preprocedural laboratory examination: Secondary | ICD-10-CM | POA: Insufficient documentation

## 2014-01-15 LAB — URINALYSIS, ROUTINE W REFLEX MICROSCOPIC
Bilirubin Urine: NEGATIVE
GLUCOSE, UA: NEGATIVE mg/dL
Hgb urine dipstick: NEGATIVE
KETONES UR: NEGATIVE mg/dL
Leukocytes, UA: NEGATIVE
Nitrite: NEGATIVE
Protein, ur: NEGATIVE mg/dL
Specific Gravity, Urine: 1.023 (ref 1.005–1.030)
Urobilinogen, UA: 0.2 mg/dL (ref 0.0–1.0)
pH: 5.5 (ref 5.0–8.0)

## 2014-01-15 LAB — CBC
HEMATOCRIT: 41.6 % (ref 39.0–52.0)
Hemoglobin: 13.7 g/dL (ref 13.0–17.0)
MCH: 30.4 pg (ref 26.0–34.0)
MCHC: 32.9 g/dL (ref 30.0–36.0)
MCV: 92.4 fL (ref 78.0–100.0)
Platelets: 197 10*3/uL (ref 150–400)
RBC: 4.5 MIL/uL (ref 4.22–5.81)
RDW: 13.3 % (ref 11.5–15.5)
WBC: 7.3 10*3/uL (ref 4.0–10.5)

## 2014-01-15 LAB — BASIC METABOLIC PANEL
BUN: 25 mg/dL — AB (ref 6–23)
CO2: 26 mEq/L (ref 19–32)
Calcium: 9.3 mg/dL (ref 8.4–10.5)
Chloride: 102 mEq/L (ref 96–112)
Creatinine, Ser: 0.71 mg/dL (ref 0.50–1.35)
GFR calc Af Amer: >90 mL/min (ref >90)
Glucose, Bld: 97 mg/dL (ref 70–99)
Potassium: 4 mEq/L (ref 3.7–5.3)
Sodium: 140 mEq/L (ref 137–147)

## 2014-01-15 LAB — SURGICAL PCR SCREEN
MRSA, PCR: NEGATIVE
Staphylococcus aureus: POSITIVE — AB

## 2014-01-15 LAB — PROTIME-INR
INR: 0.99 (ref 0.00–1.49)
Prothrombin Time: 12.9 seconds (ref 11.6–15.2)

## 2014-01-15 LAB — APTT: APTT: 28 s (ref 24–37)

## 2014-01-15 NOTE — Progress Notes (Signed)
Prescription for Mupirocin called in to [facility] on [address]. Pt made aware.

## 2014-01-15 NOTE — Progress Notes (Signed)
01/15/14 1127  OBSTRUCTIVE SLEEP APNEA  Have you ever been diagnosed with sleep apnea through a sleep study? No  Do you snore loudly (loud enough to be heard through closed doors)?  1  Do you often feel tired, fatigued, or sleepy during the daytime? 0  Has anyone observed you stop breathing during your sleep? 0  Do you have, or are you being treated for high blood pressure? 1  BMI more than 35 kg/m2? 0  Age over 59 years old? 1  Neck circumference greater than 40 cm/18 inches? 0  Gender: 1  Obstructive Sleep Apnea Score 4

## 2014-01-15 NOTE — Patient Instructions (Signed)
[name]  Douglas/16/2015   Your procedure is scheduled on: Douglas/20/15   Report to [facility] at 5:30 AM.  Call this number if you have problems the morning of surgery [phone]   Remember:   Do not eat food or drink liquids After Midnight.     Take these medicines the morning of surgery with A SIP OF WATER: amlodipine, hydrocodone if needed   Do not wear jewelry, make-up or nail polish.  Do not wear lotions, powders, or perfumes. You may wear deodorant.  Do not shave 48 hours prior to surgery. Men may shave face and neck.  Do not bring valuables to the hospital.  Contacts, dentures or bridgework may not be worn into surgery.  Leave suitcase in the car. After surgery it may be brought to your room.  For patients admitted to the hospital, checkout time is 11:00 AM the day of discharge.    Please read over the following fact sheets that you were given:[place] preparing for surgery sheet, MRSA Information, blood fact sheet Paulette Blanch, RN  pre op nurse call if needed [phone]    FAILURE TO [place]   Patient Signature: ___________________________________________

## 2014-01-19 ENCOUNTER — Encounter (HOSPITAL_COMMUNITY)
Admission: RE | Disposition: A | Discharge status: Home Health | Payer: Self-pay | Source: Ambulatory Visit | Attending: Orthopaedic Surgery

## 2014-01-19 ENCOUNTER — Inpatient Hospital Stay (HOSPITAL_COMMUNITY): Payer: BC Managed Care – PPO | Admitting: Certified Registered"

## 2014-01-19 ENCOUNTER — Inpatient Hospital Stay (HOSPITAL_COMMUNITY): Payer: BC Managed Care – PPO

## 2014-01-19 ENCOUNTER — Encounter (HOSPITAL_COMMUNITY): Payer: BC Managed Care – PPO | Admitting: Certified Registered"

## 2014-01-19 ENCOUNTER — Encounter (HOSPITAL_COMMUNITY): Payer: Self-pay

## 2014-01-19 ENCOUNTER — Inpatient Hospital Stay (HOSPITAL_COMMUNITY)
Admission: RE | Admit: 2014-01-19 | Discharge: 2014-01-21 | DRG: 470 | Disposition: A | Discharge status: Home Health | Payer: BC Managed Care – PPO | Source: Ambulatory Visit | Attending: Orthopaedic Surgery | Admitting: Orthopaedic Surgery

## 2014-01-19 DIAGNOSIS — I10 Essential (primary) hypertension: Secondary | ICD-10-CM | POA: Diagnosis present

## 2014-01-19 DIAGNOSIS — D62 Acute posthemorrhagic anemia: Secondary | ICD-10-CM | POA: Diagnosis not present

## 2014-01-19 DIAGNOSIS — Z96659 Presence of unspecified artificial knee joint: Secondary | ICD-10-CM

## 2014-01-19 DIAGNOSIS — Z860100 Personal history of colon polyps, unspecified: Secondary | ICD-10-CM

## 2014-01-19 DIAGNOSIS — K219 Gastro-esophageal reflux disease without esophagitis: Secondary | ICD-10-CM | POA: Diagnosis present

## 2014-01-19 DIAGNOSIS — M1710 Unilateral primary osteoarthritis, unspecified knee: Principal | ICD-10-CM | POA: Diagnosis present

## 2014-01-19 DIAGNOSIS — Z800 Family history of malignant neoplasm of digestive organs: Secondary | ICD-10-CM

## 2014-01-19 DIAGNOSIS — M1711 Unilateral primary osteoarthritis, right knee: Secondary | ICD-10-CM

## 2014-01-19 DIAGNOSIS — Z79899 Other long term (current) drug therapy: Secondary | ICD-10-CM

## 2014-01-19 HISTORY — PX: TOTAL KNEE ARTHROPLASTY: SHX125

## 2014-01-19 LAB — TYPE AND SCREEN
ABO/RH(D): O POS
ANTIBODY SCREEN: NEGATIVE

## 2014-01-19 SURGERY — ARTHROPLASTY, KNEE, TOTAL
Anesthesia: Spinal | Site: Knee | Laterality: Right

## 2014-01-19 MED ORDER — BUPIVACAINE HCL (PF) 0.25 % IJ SOLN
INTRAMUSCULAR | Status: AC
  Filled 2014-01-19: qty 30

## 2014-01-19 MED ORDER — LACTATED RINGERS IV SOLN: INTRAVENOUS | Status: DC

## 2014-01-19 MED ORDER — MIDAZOLAM HCL 2 MG/2ML IJ SOLN
INTRAMUSCULAR | Status: AC
  Filled 2014-01-19: qty 2

## 2014-01-19 MED ORDER — DIPHENHYDRAMINE HCL 12.5 MG/5ML PO ELIX
12.5000 mg | ORAL_SOLUTION | ORAL | Status: DC | PRN
  Administered 2014-01-19 – 2014-01-20 (×2): 25 mg via ORAL
  Filled 2014-01-19 (×2): qty 10

## 2014-01-19 MED ORDER — METHYLPREDNISOLONE ACETATE 40 MG/ML IJ SUSP
INTRAMUSCULAR | Status: AC
  Filled 2014-01-19: qty 1

## 2014-01-19 MED ORDER — DEXAMETHASONE SODIUM PHOSPHATE 10 MG/ML IJ SOLN
INTRAMUSCULAR | Status: AC
  Filled 2014-01-19: qty 1

## 2014-01-19 MED ORDER — POLYETHYLENE GLYCOL 3350 17 G PO PACK
17.0000 g | PACK | Freq: Every day | ORAL | Status: DC | PRN
  Filled 2014-01-19: qty 1

## 2014-01-19 MED ORDER — LISINOPRIL 10 MG PO TABS
10.0000 mg | ORAL_TABLET | Freq: Every morning | ORAL | Status: DC
  Administered 2014-01-20 – 2014-01-21 (×2): 10 mg via ORAL
  Filled 2014-01-19 (×3): qty 1

## 2014-01-19 MED ORDER — SODIUM CHLORIDE 0.9 % IJ SOLN
INTRAMUSCULAR | Status: AC
  Filled 2014-01-19: qty 50

## 2014-01-19 MED ORDER — PROPOFOL 10 MG/ML IV BOLUS
INTRAVENOUS | Status: AC
  Filled 2014-01-19: qty 20

## 2014-01-19 MED ORDER — METOCLOPRAMIDE HCL 5 MG/ML IJ SOLN: 5.0000 mg | Freq: Three times a day (TID) | INTRAMUSCULAR | Status: DC | PRN

## 2014-01-19 MED ORDER — HYDROMORPHONE HCL PF 1 MG/ML IJ SOLN
INTRAMUSCULAR | Status: AC
  Administered 2014-01-19: 1 mg via INTRAVENOUS
  Filled 2014-01-19: qty 1

## 2014-01-19 MED ORDER — METHYLPREDNISOLONE ACETATE 40 MG/ML IJ SUSP
INTRAMUSCULAR | Status: DC | PRN
  Administered 2014-01-19: 40 mg

## 2014-01-19 MED ORDER — OXYCODONE HCL ER 20 MG PO T12A
20.0000 mg | EXTENDED_RELEASE_TABLET | Freq: Two times a day (BID) | ORAL | Status: DC
  Administered 2014-01-19 – 2014-01-21 (×5): 20 mg via ORAL
  Filled 2014-01-19 (×5): qty 1

## 2014-01-19 MED ORDER — DEXAMETHASONE SODIUM PHOSPHATE 10 MG/ML IJ SOLN
INTRAMUSCULAR | Status: DC | PRN
  Administered 2014-01-19: 10 mg via INTRAVENOUS

## 2014-01-19 MED ORDER — AMLODIPINE BESYLATE 10 MG PO TABS
10.0000 mg | ORAL_TABLET | Freq: Every day | ORAL | Status: DC
  Administered 2014-01-20 – 2014-01-21 (×2): 10 mg via ORAL
  Filled 2014-01-19 (×2): qty 1

## 2014-01-19 MED ORDER — LACTATED RINGERS IV SOLN
INTRAVENOUS | Status: DC | PRN
  Administered 2014-01-19 (×2): via INTRAVENOUS

## 2014-01-19 MED ORDER — HYDROMORPHONE HCL PF 1 MG/ML IJ SOLN
1.0000 mg | INTRAMUSCULAR | Status: DC | PRN
  Administered 2014-01-19 – 2014-01-21 (×11): 1 mg via INTRAVENOUS
  Filled 2014-01-19 (×11): qty 1

## 2014-01-19 MED ORDER — BUPIVACAINE LIPOSOME 1.3 % IJ SUSP
20.0000 mL | Freq: Once | INTRAMUSCULAR | Status: AC
  Administered 2014-01-19: 20 mL
  Filled 2014-01-19: qty 20

## 2014-01-19 MED ORDER — DOXAZOSIN MESYLATE 4 MG PO TABS
4.0000 mg | ORAL_TABLET | Freq: Every day | ORAL | Status: DC
  Administered 2014-01-19 – 2014-01-20 (×2): 4 mg via ORAL
  Filled 2014-01-19 (×3): qty 1

## 2014-01-19 MED ORDER — ACETAMINOPHEN 650 MG RE SUPP: 650.0000 mg | Freq: Four times a day (QID) | RECTAL | Status: DC | PRN

## 2014-01-19 MED ORDER — METHOCARBAMOL 500 MG PO TABS
500.0000 mg | ORAL_TABLET | Freq: Four times a day (QID) | ORAL | Status: DC | PRN
  Administered 2014-01-19 – 2014-01-21 (×6): 500 mg via ORAL
  Filled 2014-01-19 (×6): qty 1

## 2014-01-19 MED ORDER — PROPOFOL INFUSION 10 MG/ML OPTIME
INTRAVENOUS | Status: DC | PRN
  Administered 2014-01-19: 100 ug/kg/min via INTRAVENOUS

## 2014-01-19 MED ORDER — ONDANSETRON HCL 4 MG/2ML IJ SOLN
INTRAMUSCULAR | Status: DC | PRN
  Administered 2014-01-19: 4 mg via INTRAVENOUS

## 2014-01-19 MED ORDER — SODIUM CHLORIDE 0.9 % IV SOLN
INTRAVENOUS | Status: DC
  Administered 2014-01-19: 75 mL/h via INTRAVENOUS

## 2014-01-19 MED ORDER — FENTANYL CITRATE 0.05 MG/ML IJ SOLN
INTRAMUSCULAR | Status: DC | PRN
  Administered 2014-01-19 (×2): 50 ug via INTRAVENOUS

## 2014-01-19 MED ORDER — HYDROMORPHONE HCL PF 1 MG/ML IJ SOLN
0.2500 mg | INTRAMUSCULAR | Status: DC | PRN
  Administered 2014-01-19 (×4): 0.5 mg via INTRAVENOUS

## 2014-01-19 MED ORDER — ASPIRIN EC 325 MG PO TBEC
325.0000 mg | DELAYED_RELEASE_TABLET | Freq: Two times a day (BID) | ORAL | Status: DC
  Administered 2014-01-20 – 2014-01-21 (×3): 325 mg via ORAL
  Filled 2014-01-19 (×5): qty 1

## 2014-01-19 MED ORDER — PHENOL 1.4 % MT LIQD
1.0000 | OROMUCOSAL | Status: DC | PRN
  Filled 2014-01-19: qty 177

## 2014-01-19 MED ORDER — ONDANSETRON HCL 4 MG/2ML IJ SOLN: 4.0000 mg | Freq: Four times a day (QID) | INTRAMUSCULAR | Status: DC | PRN

## 2014-01-19 MED ORDER — ADULT MULTIVITAMIN W/MINERALS CH
1.0000 | ORAL_TABLET | Freq: Every morning | ORAL | Status: DC
  Administered 2014-01-19 – 2014-01-21 (×3): 1 via ORAL
  Filled 2014-01-19 (×3): qty 1

## 2014-01-19 MED ORDER — LIDOCAINE HCL (CARDIAC) 20 MG/ML IV SOLN
INTRAVENOUS | Status: AC
  Filled 2014-01-19: qty 5

## 2014-01-19 MED ORDER — METHOCARBAMOL 100 MG/ML IJ SOLN
500.0000 mg | Freq: Four times a day (QID) | INTRAVENOUS | Status: DC | PRN
  Administered 2014-01-19: 500 mg via INTRAVENOUS
  Filled 2014-01-19: qty 5

## 2014-01-19 MED ORDER — OXYCODONE HCL 5 MG PO TABS
5.0000 mg | ORAL_TABLET | ORAL | Status: DC | PRN
  Administered 2014-01-19: 5 mg via ORAL
  Administered 2014-01-19: 10 mg via ORAL
  Administered 2014-01-19: 5 mg via ORAL
  Administered 2014-01-19 – 2014-01-21 (×12): 10 mg via ORAL
  Filled 2014-01-19 (×6): qty 2
  Filled 2014-01-19: qty 1
  Filled 2014-01-19 (×4): qty 2
  Filled 2014-01-19: qty 1
  Filled 2014-01-19 (×5): qty 2

## 2014-01-19 MED ORDER — METOCLOPRAMIDE HCL 10 MG PO TABS: 5.0000 mg | ORAL_TABLET | Freq: Three times a day (TID) | ORAL | Status: DC | PRN

## 2014-01-19 MED ORDER — MIDAZOLAM HCL 5 MG/5ML IJ SOLN
INTRAMUSCULAR | Status: DC | PRN
  Administered 2014-01-19: 2 mg via INTRAVENOUS

## 2014-01-19 MED ORDER — DOCUSATE SODIUM 100 MG PO CAPS
100.0000 mg | ORAL_CAPSULE | Freq: Two times a day (BID) | ORAL | Status: DC
  Administered 2014-01-19 – 2014-01-21 (×5): 100 mg via ORAL
  Filled 2014-01-19 (×3): qty 1

## 2014-01-19 MED ORDER — PROMETHAZINE HCL 25 MG/ML IJ SOLN: 6.2500 mg | INTRAMUSCULAR | Status: DC | PRN

## 2014-01-19 MED ORDER — SODIUM CHLORIDE 0.9 % IR SOLN
Status: DC | PRN
  Administered 2014-01-19: 1000 mL

## 2014-01-19 MED ORDER — CEFAZOLIN SODIUM-DEXTROSE 2-3 GM-% IV SOLR
2.0000 g | INTRAVENOUS | Status: AC
Start: 2014-01-19 — End: 2014-01-19
  Administered 2014-01-19: 2 g via INTRAVENOUS

## 2014-01-19 MED ORDER — CEFAZOLIN SODIUM-DEXTROSE 2-3 GM-% IV SOLR
2.0000 g | Freq: Four times a day (QID) | INTRAVENOUS | Status: AC
  Administered 2014-01-19 (×2): 2 g via INTRAVENOUS
  Filled 2014-01-19 (×2): qty 50

## 2014-01-19 MED ORDER — HYDROMORPHONE HCL PF 1 MG/ML IJ SOLN
INTRAMUSCULAR | Status: AC
  Administered 2014-01-20: 1 mg via INTRAVENOUS
  Filled 2014-01-19: qty 1

## 2014-01-19 MED ORDER — CEFAZOLIN SODIUM-DEXTROSE 2-3 GM-% IV SOLR
INTRAVENOUS | Status: AC
  Filled 2014-01-19: qty 50

## 2014-01-19 MED ORDER — MEPERIDINE HCL 50 MG/ML IJ SOLN: 6.2500 mg | INTRAMUSCULAR | Status: DC | PRN

## 2014-01-19 MED ORDER — FENTANYL CITRATE 0.05 MG/ML IJ SOLN
INTRAMUSCULAR | Status: AC
  Filled 2014-01-19: qty 2

## 2014-01-19 MED ORDER — 0.9 % SODIUM CHLORIDE (POUR BTL) OPTIME
TOPICAL | Status: DC | PRN
  Administered 2014-01-19: 1000 mL

## 2014-01-19 MED ORDER — TRANEXAMIC ACID 100 MG/ML IV SOLN
1000.0000 mg | INTRAVENOUS | Status: AC
  Administered 2014-01-19: 1000 mg via INTRAVENOUS
  Filled 2014-01-19: qty 10

## 2014-01-19 MED ORDER — FINASTERIDE 5 MG PO TABS
2.5000 mg | ORAL_TABLET | ORAL | Status: DC
  Administered 2014-01-19 – 2014-01-21 (×2): 2.5 mg via ORAL
  Filled 2014-01-19 (×2): qty 0.5

## 2014-01-19 MED ORDER — ALUM & MAG HYDROXIDE-SIMETH 200-200-20 MG/5ML PO SUSP: 30.0000 mL | ORAL | Status: DC | PRN

## 2014-01-19 MED ORDER — BUPIVACAINE IN DEXTROSE 0.75-8.25 % IT SOLN
INTRATHECAL | Status: DC | PRN
  Administered 2014-01-19: 2 mL via INTRATHECAL

## 2014-01-19 MED ORDER — MENTHOL 3 MG MT LOZG
1.0000 | LOZENGE | OROMUCOSAL | Status: DC | PRN
  Filled 2014-01-19: qty 9

## 2014-01-19 MED ORDER — SODIUM CHLORIDE 0.9 % IJ SOLN
INTRAMUSCULAR | Status: DC | PRN
  Administered 2014-01-19: 40 mL

## 2014-01-19 MED ORDER — BUPIVACAINE HCL 0.25 % IJ SOLN
INTRAMUSCULAR | Status: DC | PRN
  Administered 2014-01-19: 4 mL

## 2014-01-19 MED ORDER — ONDANSETRON HCL 4 MG/2ML IJ SOLN
INTRAMUSCULAR | Status: AC
  Filled 2014-01-19: qty 2

## 2014-01-19 MED ORDER — ACETAMINOPHEN 325 MG PO TABS: 650.0000 mg | ORAL_TABLET | Freq: Four times a day (QID) | ORAL | Status: DC | PRN

## 2014-01-19 MED ORDER — ONDANSETRON HCL 4 MG PO TABS: 4.0000 mg | ORAL_TABLET | Freq: Four times a day (QID) | ORAL | Status: DC | PRN

## 2014-01-19 MED ORDER — PROPOFOL 10 MG/ML IV BOLUS
INTRAVENOUS | Status: DC | PRN
  Administered 2014-01-19 (×2): 40 mg via INTRAVENOUS

## 2014-01-19 MED ORDER — LIDOCAINE HCL (CARDIAC) 20 MG/ML IV SOLN
INTRAVENOUS | Status: DC | PRN
  Administered 2014-01-19: 20 mg via INTRAVENOUS

## 2014-01-19 SURGICAL SUPPLY — 62 items
BAG ZIPLOCK 12X15 (MISCELLANEOUS) IMPLANT
BANDAGE ELASTIC 6 VELCRO ST LF (GAUZE/BANDAGES/DRESSINGS) ×2 IMPLANT
BANDAGE ESMARK 6X9 LF (GAUZE/BANDAGES/DRESSINGS) ×1 IMPLANT
BENZOIN TINCTURE PRP APPL 2/3 (GAUZE/BANDAGES/DRESSINGS) ×2 IMPLANT
BLADE SAG 13.0X1.37X90 (BLADE) ×2 IMPLANT
BLADE SAG 18X100X1.27 (BLADE) ×2 IMPLANT
BLADE SAGITTAL 25.0X1.37X90 (BLADE) ×2 IMPLANT
BNDG ESMARK 6X9 LF (GAUZE/BANDAGES/DRESSINGS) ×2
BOWL SMART MIX CTS (DISPOSABLE) ×2 IMPLANT
CEMENT BONE 1-PACK (Cement) ×4 IMPLANT
CUFF TOURN SGL QUICK 34 (TOURNIQUET CUFF) ×1
CUFF TRNQT CYL 34X4X40X1 (TOURNIQUET CUFF) ×1 IMPLANT
DRAPE EXTREMITY T 121X128X90 (DRAPE) ×2 IMPLANT
DRAPE LG THREE QUARTER DISP (DRAPES) ×2 IMPLANT
DRAPE POUCH INSTRU U-SHP 10X18 (DRAPES) ×2 IMPLANT
DRAPE U-SHAPE 47X51 STRL (DRAPES) ×2 IMPLANT
DRSG AQUACEL AG ADV 3.5X10 (GAUZE/BANDAGES/DRESSINGS) IMPLANT
DRSG PAD ABDOMINAL 8X10 ST (GAUZE/BANDAGES/DRESSINGS) IMPLANT
DRSG TEGADERM 4X4.75 (GAUZE/BANDAGES/DRESSINGS) IMPLANT
DURAPREP 26ML APPLICATOR (WOUND CARE) ×2 IMPLANT
ELECT REM PT RETURN 9FT ADLT (ELECTROSURGICAL) ×2
ELECTRODE REM PT RTRN 9FT ADLT (ELECTROSURGICAL) ×1 IMPLANT
EVACUATOR 1/8 PVC DRAIN (DRAIN) ×2 IMPLANT
GAUZE SPONGE 2X2 8PLY STRL LF (GAUZE/BANDAGES/DRESSINGS) IMPLANT
GAUZE XEROFORM 1X8 LF (GAUZE/BANDAGES/DRESSINGS) IMPLANT
GLOVE BIO SURGEON STRL SZ7.5 (GLOVE) ×4 IMPLANT
GLOVE BIOGEL PI IND STRL 8 (GLOVE) ×2 IMPLANT
GLOVE BIOGEL PI INDICATOR 8 (GLOVE) ×2
GLOVE ECLIPSE 8.0 STRL XLNG CF (GLOVE) ×2 IMPLANT
GOWN BRE IMP PREV XXLGXLNG (GOWN DISPOSABLE) ×2 IMPLANT
GOWN STRL REUS W/TWL XL LVL3 (GOWN DISPOSABLE) ×6 IMPLANT
HANDPIECE INTERPULSE COAX TIP (DISPOSABLE) ×1
IMMOBILIZER KNEE 20 (SOFTGOODS) ×2 IMPLANT
KIT BASIN OR (CUSTOM PROCEDURE TRAY) ×2 IMPLANT
KNEE/VIT E POLY LINER LEVEL 1B ×2 IMPLANT
NEEDLE HYPO 21X1.5 SAFETY (NEEDLE) ×4 IMPLANT
NS IRRIG 1000ML POUR BTL (IV SOLUTION) ×2 IMPLANT
PACK TOTAL JOINT (CUSTOM PROCEDURE TRAY) ×2 IMPLANT
PAD ABD 8X10 STRL (GAUZE/BANDAGES/DRESSINGS) ×2 IMPLANT
PADDING CAST COTTON 6X4 STRL (CAST SUPPLIES) ×2 IMPLANT
POSITIONER SURGICAL ARM (MISCELLANEOUS) ×2 IMPLANT
SET HNDPC FAN SPRY TIP SCT (DISPOSABLE) ×1 IMPLANT
SET PAD KNEE POSITIONER (MISCELLANEOUS) ×2 IMPLANT
SPONGE GAUZE 2X2 STER 10/PKG (GAUZE/BANDAGES/DRESSINGS)
SPONGE GAUZE 4X4 12PLY (GAUZE/BANDAGES/DRESSINGS) ×2 IMPLANT
STAPLER VISISTAT 35W (STAPLE) IMPLANT
STRIP CLOSURE SKIN 1/2X4 (GAUZE/BANDAGES/DRESSINGS) ×2 IMPLANT
SUCTION FRAZIER 12FR DISP (SUCTIONS) ×2 IMPLANT
SUT MNCRL AB 4-0 PS2 18 (SUTURE) ×2 IMPLANT
SUT VIC AB 0 CT1 27 (SUTURE)
SUT VIC AB 0 CT1 27XBRD ANTBC (SUTURE) IMPLANT
SUT VIC AB 1 CT1 27 (SUTURE) ×3
SUT VIC AB 1 CT1 27XBRD ANTBC (SUTURE) ×3 IMPLANT
SUT VIC AB 2-0 CT1 27 (SUTURE) ×2
SUT VIC AB 2-0 CT1 TAPERPNT 27 (SUTURE) ×2 IMPLANT
SYR 50ML LL SCALE MARK (SYRINGE) ×2 IMPLANT
SYR CONTROL 10ML LL (SYRINGE) ×2 IMPLANT
TOWEL OR 17X26 10 PK STRL BLUE (TOWEL DISPOSABLE) ×2 IMPLANT
TOWEL OR NON WOVEN STRL DISP B (DISPOSABLE) ×2 IMPLANT
TRAY FOLEY METER SIL LF 16FR (CATHETERS) ×2 IMPLANT
WATER STERILE IRR 1500ML POUR (IV SOLUTION) ×2 IMPLANT
WRAP KNEE MAXI GEL POST OP (GAUZE/BANDAGES/DRESSINGS) ×2 IMPLANT

## 2014-01-19 NOTE — Transfer of Care (Signed)
Immediate Anesthesia Transfer of Care Note  Patient: [name]  Procedure(s) Performed: Procedure(s) (LRB): RIGHT TOTAL KNEE ARTHROPLASTY, Steroid injection left knee (Right)  Patient Location: PACU  Anesthesia Type: Spinal  Level of Consciousness: sedated, patient cooperative and responds to stimulation  Airway & Oxygen Therapy: Patient Spontanous Breathing and Patient connected to face mask oxgen  Post-op Assessment: Report given to PACU RN and Post -op Vital signs reviewed and stable  Post vital signs: Reviewed and stable  Complications: No apparent anesthesia complications

## 2014-01-19 NOTE — Anesthesia Postprocedure Evaluation (Signed)
  Anesthesia Post-op Note  Patient: [name]  Procedure(s) Performed: Procedure(s) (LRB): RIGHT TOTAL KNEE ARTHROPLASTY, Steroid injection left knee (Right)  Patient Location: PACU  Anesthesia Type: Spinal  Level of Consciousness: awake and alert   Airway and Oxygen Therapy: Patient Spontanous Breathing  Post-op Pain: mild  Post-op Assessment: Post-op Vital signs reviewed, Patient's Cardiovascular Status Stable, Respiratory Function Stable, Patent Airway and No signs of Nausea or vomiting  Last Vitals:  Filed Vitals:   01/19/14 1400  BP: 150/65  Pulse: 68  Temp: 36.3 C  Resp: 16    Post-op Vital Signs: stable   Complications: No apparent anesthesia complications

## 2014-01-19 NOTE — Op Note (Signed)
NAMEZEDRIC, DEROY NO.:  [id number]  MEDICAL RECORD NO.:  [id number]  LOCATION:  [facility]PO                         FACILITY:  [facility]  PHYSICIAN:  Lind Guest. Ninfa Linden, M.D.DATE OF BIRTH:  [birth year]  DATE OF PROCEDURE:  01/19/2014 DATE OF DISCHARGE:                              OPERATIVE REPORT   PREOPERATIVE DIAGNOSES: 1. Severe end-stage arthritis and degenerative joint disease, right     knee. 2. End-stage arthritis, degenerative joint disease, left knee.  POSTOPERATIVE DIAGNOSES: 1. Severe end-stage arthritis and degenerative joint disease, right     knee. 2. End-stage arthritis, degenerative joint disease, left knee.  PROCEDURE: 1. Right total knee arthroplasty. 2. Steroid injection, left knee.  IMPLANTS:  Stryker Triathlon knee with size 6 femur, size 7 tibial tray, 13-mm polyethylene insert, size 35 patella button.  SURGEON:  Jean Rosenthal MD.  ASSISTANT:  [name], PA  ANESTHESIA: 1. Spinal. 2. Exparel intra-articular injection, right knee.  TOURNIQUET TIME:  60 minutes.  BLOOD LOSS:  Less than 100 mL.  COMPLICATIONS:  None.  INDICATIONS:  Mr. Battershell is a 58 year old gentleman well known to me. He has well documented severe end-stage arthritis involving both his knees with the right knee worse than the left.  He has had recurrent effusions and x-rays showed complete loss of the joint space, periarticular osteophytes and subchondral sclerosis.  This has now greatly affected his activities of daily living, his mobility.  His pain is severe.  He has failed all modes of conservative treatment.  At this point, wish to proceed with a total knee arthroplasty.  He understands the risks of acute blood loss anemia, nerve and vessel injury, fracture, infection, and DVT.  He understands the goals are decreased pain, improved mobility, and overall improved quality of life.  PROCEDURE DESCRIPTION:  After informed consent was obtained,  and appropriate right knee was marked for surgery, and left knee was marked for injection, he was brought to the operating room and spinal anesthesia was obtained while he was on the stretcher.  He was then placed supine on the operating table.  A Foley catheter was placed.  His right operative leg had a nonsterile tourniquet placed around his upper right thigh.  His right leg was prepped and draped from the thigh down to the ankle with DuraPrep and sterile drapes including a sterile stockinette.  Of note, he did have 2 g of IV Ancef infused prior to the tourniquet.  His right leg was then prepped and draped with DuraPrep and sterile drapes.  A time-out was called to identify the correct patient and correct right knee for the surgery and left knee for steroid injection.  We then used Esmarch wrap on the leg and tourniquet was inflated to 300 mm of pressure.  I then made a midline incision directly over the knee and carried this proximally and distally.  I dissected down to the knee joint and carried out a medial parapatellar arthrotomy. There was a large joint effusion that was found and significant synovitis throughout the knee.  With the knee then flexed and the patella subluxed, we removed abundant osteophytes throughout the knee. We found the knee completely devoid of  cartilage.  Removed deep and some synovium from the knee.  We then focused attention on the tibia.  With the extramedullary tibial cutting guide in place, we set up for a neutral slope and corrected for varus and valgus, and made our tibial cut 9 mm off the high side.  We went to the femur with the knee in a flexed position.  We used an intramedullary guide for the femur and set our distal femoral cut at 5 degrees rotator for right knee and 10 mm to the distal femoral cut.  Once we made that, we brought the knee back in extension with a 9 mm extension block, it actually hyperextend, we went up to a 13 mm extension block  and the knee had full extension.  We then went back to the femur and put our femoral sizing guide based on the epicondylar axis and chose a size 6 femur.  We placed a 4:1 cutting block and made our anterior and posterior cuts followed by our chamfer cuts.  We then made our femoral box cut.  Attention was then turned back to the tibia.  We trialed for a size 7 tibia and then we placed our keel and punch for this.  We then placed the real trial femur size 6, trial tibia size 7 and a 13 mm polyethylene insert.  I was pleased with his mobility and range of motion and stability.  We then cut 10 mm off the patella and drilled the holes for a 35 patellar button.  I then removed all trial components.  We copiously irrigated the knee with normal saline solution and then infiltrated the joint capsule with Exparel mixed with saline.  We then mixed the cement and then cemented a real Stryker Triathlon tibial tray, size 7, the real size 6 femur.  We placed a real 13-mm fix bearing polyethylene insert and cemented the size 35 patella button.  Once the cement had hardened, we let the tourniquet down and hemostasis was obtained with electrocautery.  We placed a medium Hemovac drain in the knee joint itself and then closed the arthrotomy with interrupted #1 Vicryl suture followed by 0 Vicryl in the deep tissue, 2-0 Vicryl in subcutaneous tissue, 4-0 Monocryl subcuticular stitch and Steri-Strips on the skin.  Well-padded dressing was applied.  We then cleaned his left knee superior lateral aspect with Betadine and alcohol prior to injection of 3 mL of 0.25% plain Marcaine mixed with 1 mL of Depo-Medrol.  He was then awakened and taken to recovery room in stable condition.  All final counts were correct, and there were no complications noted.  Of note, [name], PA-C was present through the entire case.  His presence was crucial for helping to facilitate this case     Lind Guest. Ninfa Linden,  M.D.     CYB/MEDQ  D:  01/19/2014  T:  01/19/2014  Job:  [id number]

## 2014-01-19 NOTE — Anesthesia Procedure Notes (Signed)
Spinal  Patient location during procedure: OR Start time: 01/19/2014 7:40 AM End time: 01/19/2014 7:43 AM Staffing CRNA/Resident: Lajuana Carry E Performed by: resident/CRNA  Preanesthetic Checklist Completed: patient identified, site marked, surgical consent, pre-op evaluation, timeout performed, IV checked, risks and benefits discussed and monitors and equipment checked Spinal Block Patient position: sitting Prep: Betadine Patient monitoring: heart rate, continuous pulse ox and blood pressure Approach: midline Location: L3-4 Injection technique: single-shot Needle Needle type: Sprotte  Needle gauge: 24 G Needle length: 10 cm Assessment Sensory level: T8 Additional Notes Kit expiration checked (07/2015), sterile prep and drape, clear CSF, neg heme first attempt. Clear CSF pre/post injection. Tol well, return to supine

## 2014-01-19 NOTE — H&P (Signed)
TOTAL KNEE ADMISSION H&P  Patient is being admitted for right total knee arthroplasty.  Subjective:  Chief Complaint:right knee pain.  HPI: [name], 58 y.o. male, has a history of pain and functional disability in the right knee due to arthritis and has failed non-surgical conservative treatments for greater than 12 weeks to includeNSAID's and/or analgesics, corticosteriod injections, flexibility and strengthening excercises, use of assistive devices, weight reduction as appropriate and activity modification.  Onset of symptoms was gradual, starting 3 years ago with gradually worsening course since that time. The patient noted no past surgery on the right knee(s).  Patient currently rates pain in the right knee(s) at 10 out of 10 with activity. Patient has night pain, worsening of pain with activity and weight bearing, pain that interferes with activities of daily living, pain with passive range of motion, crepitus and joint swelling.  Patient has evidence of subchondral sclerosis, periarticular osteophytes and joint space narrowing by imaging studies. There is no active infection.  Patient Active Problem List   Diagnosis Date Noted  . Arthritis of knee, right 01/19/2014   Past Medical History  Diagnosis Date  . Hypertension     takes Cardura and NOrvasc daily  . Arthritis     knees  . History of colon polyps   . GERD (gastroesophageal reflux disease)     hx of, not freq  . Seizures     had one approx. 30 yrs ago,has not had any since    Past Surgical History  Procedure Laterality Date  . Arthroscopy knee w/ drilling  bilateral    2012  . Ankle fusion  left    Dec. 2012  . Joint replacement      right hip  01-2011  . Colonoscopy      x2  . Hardware removal  06/23/2012    Procedure: HARDWARE REMOVAL;  Surgeon: Wylene Simmer, MD;  Location: [facility];  Service: Orthopedics;  Laterality: Left;  Removal of Deep Implant  X's 3  . Foot arthrodesis  06/23/2012    Procedure: ARTHRODESIS  FOOT;  Surgeon: Wylene Simmer, MD;  Location: [facility];  Service: Orthopedics;  Laterality: Left;  Left Subtalar and Talonavicular Joint Revision Arthrodesis  Aspiration of Bone Marrow from Left Hip   . Hair transplant      Prescriptions prior to admission  Medication Sig Dispense Refill  . amLODipine (NORVASC) 10 MG tablet Take 10 mg by mouth every morning.        Marland Kitchen doxazosin (CARDURA) 4 MG tablet Take 4 mg by mouth at bedtime.       . finasteride (PROSCAR) 5 MG tablet Take 2.5 mg by mouth every other day.      Marland Kitchen HYDROcodone-acetaminophen (NORCO) 7.5-325 MG per tablet Take 1 tablet by mouth every 6 (six) hours as needed for moderate pain.      Marland Kitchen ibuprofen (ADVIL,MOTRIN) 200 MG tablet Take 600 mg by mouth every 6 (six) hours as needed for mild pain or moderate pain.      Marland Kitchen lisinopril (PRINIVIL,ZESTRIL) 10 MG tablet Take 10 mg by mouth every morning.      . Multiple Vitamin (MULTIVITAMIN WITH MINERALS) TABS Take 1 tablet by mouth every morning.      Marland Kitchen OVER THE COUNTER MEDICATION Take 1 capsule by mouth every morning. Stool softener       No Known Allergies  History  Substance Use Topics  . Smoking status: Never Smoker   . Smokeless tobacco: Never Used  .  Alcohol Use: 0.0 oz/week     Comment: 1 drink daily    Family History  Problem Relation Age of Onset  . Colon cancer Father 2  . Stomach cancer Neg Hx      Review of Systems  Musculoskeletal: Positive for joint pain.  All other systems reviewed and are negative.    Objective:  Physical Exam  Constitutional: He is oriented to person, place, and time. He appears well-developed and well-nourished.  HENT:  Head: Normocephalic and atraumatic.  Eyes: EOM are normal. Pupils are equal, round, and reactive to light.  Neck: Normal range of motion. Neck supple.  Cardiovascular: Normal rate and regular rhythm.   Respiratory: Effort normal and breath sounds normal.  GI: Soft. Bowel sounds are normal.  Musculoskeletal:       Right knee:  He exhibits decreased range of motion, effusion and abnormal alignment. Tenderness found. Medial joint line and lateral joint line tenderness noted.  Neurological: He is alert and oriented to person, place, and time.  Skin: Skin is warm and dry.  Psychiatric: He has a normal mood and affect.    Vital signs in last 24 hours: Temp:  [98.4 F (36.9 C)] 98.4 F (36.9 C) (03/20 0532) Pulse Rate:  [79] 79 (03/20 0532) Resp:  [18] 18 (03/20 0532) BP: (139)/(82) 139/82 mmHg (03/20 0532) SpO2:  [97 %] 97 % (03/20 0532)  Labs:   Estimated body mass index is 31.90 kg/(m^2) as calculated from the following:   Height as of 01/15/14: 6\' 3"  (1.905 m).   Weight as of 06/17/12: 115.758 kg (255 lb 3.2 oz).   Imaging Review Plain radiographs demonstrate severe degenerative joint disease of the right knee(s). The overall alignment ismild varus. The bone quality appears to be good for age and reported activity level.  Assessment/Plan:  End stage arthritis, right knee   The patient history, physical examination, clinical judgment of the provider and imaging studies are consistent with end stage degenerative joint disease of the right knee(s) and total knee arthroplasty is deemed medically necessary. The treatment options including medical management, injection therapy arthroscopy and arthroplasty were discussed at length. The risks and benefits of total knee arthroplasty were presented and reviewed. The risks due to aseptic loosening, infection, stiffness, patella tracking problems, thromboembolic complications and other imponderables were discussed. The patient acknowledged the explanation, agreed to proceed with the plan and consent was signed. Patient is being admitted for inpatient treatment for surgery, pain control, PT, OT, prophylactic antibiotics, VTE prophylaxis, progressive ambulation and ADL's and discharge planning. The patient is planning to be discharged home with home health services

## 2014-01-19 NOTE — Preoperative (Signed)
Beta Blockers   Reason not to administer Beta Blockers:Not Applicable 

## 2014-01-19 NOTE — Anesthesia Preprocedure Evaluation (Addendum)
Anesthesia Evaluation  Patient identified by MRN, date of birth, ID band Patient awake    Reviewed: Allergy & Precautions, H&P , NPO status , Patient's Chart, lab work & pertinent test results  Airway Mallampati: I  TM Distance: >3 FB Neck ROM: Full    Dental   Pulmonary neg pulmonary ROS,          Cardiovascular hypertension, Pt. on medications     Neuro/Psych Seizures -, Well Controlled,  negative psych ROS   GI/Hepatic Neg liver ROS, GERD-  Medicated and Controlled,  Endo/Other  negative endocrine ROS  Renal/GU negative Renal ROS  negative genitourinary   Musculoskeletal negative musculoskeletal ROS (+)   Abdominal   Peds negative pediatric ROS (+)  Hematology negative hematology ROS (+)   Anesthesia Other Findings   Reproductive/Obstetrics negative OB ROS                            Anesthesia Physical  Anesthesia Plan  ASA: II  Anesthesia Plan: Spinal   Post-op Pain Management: MAC Combined w/ Regional for Post-op pain   Induction: Intravenous  Airway Management Planned: Simple Face Mask  Additional Equipment:   Intra-op Plan:   Post-operative Plan:   Informed Consent: I have reviewed the patients History and Physical, chart, labs and discussed the procedure including the risks, benefits and alternatives for the proposed anesthesia with the patient or authorized representative who has indicated his/her understanding and acceptance.     Plan Discussed with: CRNA and Surgeon  Anesthesia Plan Comments:         Anesthesia Quick Evaluation  

## 2014-01-19 NOTE — Brief Op Note (Signed)
01/19/2014  9:25 AM  PATIENT:  Shary Decamp  58 y.o. male  PRE-OPERATIVE DIAGNOSIS:  Severe osteoarthritis right knee, Arthritis left knee  POST-OPERATIVE DIAGNOSIS:  Severe osteoarthritis right knee, Arthritis left knee  PROCEDURE:  Procedure(s): RIGHT TOTAL KNEE ARTHROPLASTY, Steroid injection left knee (Right)  SURGEON:  Surgeon(s) and Role:    * Mcarthur Rossetti, MD - Primary  PHYSICIAN ASSISTANT: Benita Stabile, PA-C  ANESTHESIA:   spinal  EBL:  Total I/O In: 1000 [I.V.:1000] Out: 450 [Urine:450]  BLOOD ADMINISTERED:none  DRAINS: medium hemovac  LOCAL MEDICATIONS USED:  OTHER Experil  SPECIMEN:  No Specimen  DISPOSITION OF SPECIMEN:  N/A  COUNTS:  YES  TOURNIQUET:   Total Tourniquet Time Documented: Thigh (Right) - 61 minutes Total: Thigh (Right) - 61 minutes   DICTATION: .Other Dictation: Dictation Number [id number]  PLAN OF CARE: Admit to inpatient   PATIENT DISPOSITION:  PACU - hemodynamically stable.   Delay start of Pharmacological VTE agent (>24hrs) due to surgical blood loss or risk of bleeding: no

## 2014-01-19 NOTE — Evaluation (Signed)
Physical Therapy Evaluation Patient Details Name: [name] MRN: [id number] DOB: 1956/03/26 Today's Date: 01/19/2014 Time: 2248-2500 PT Time Calculation (min): 33 min  PT Assessment / Plan / Recommendation History of Present Illness     Clinical Impression  Pt s/p R TKR and injection L knee presents with decreased R LE strength/ROM and post op pain limiting functional mobility.  Pt should progress well to d/c home with family assist and HHPT follow up.    PT Assessment  Patient needs continued PT services    Follow Up Recommendations  Home health PT    Does the patient have the potential to tolerate intense rehabilitation      Barriers to Discharge        Equipment Recommendations  None recommended by PT    Recommendations for Other Services OT consult   Frequency 7X/week    Precautions / Restrictions Precautions Precautions: Knee;Fall Required Braces or Orthoses: Knee Immobilizer - Right Knee Immobilizer - Right: Discontinue once straight leg raise with < 10 degree lag Restrictions Weight Bearing Restrictions: No Other Position/Activity Restrictions: WBAT   Pertinent Vitals/Pain 8/10; premed, ice packs provided      Mobility  Bed Mobility Overal bed mobility: Needs Assistance Bed Mobility: Supine to Sit Supine to sit: Min assist General bed mobility comments: cues for sequence and use of L LE to self assist  Transfers Overall transfer level: Needs assistance Equipment used: Rolling walker (2 wheeled) Transfers: Sit to/from Stand Sit to Stand: +2 safety/equipment;+2 physical assistance;Min assist;Mod assist General transfer comment: cues for LE management and use of UEs to self assist Ambulation/Gait Ambulation/Gait assistance: +2 physical assistance;+2 safety/equipment;Min assist;Mod assist Ambulation Distance (Feet): 9 Feet Assistive device: Rolling walker (2 wheeled) Gait Pattern/deviations: Step-to pattern;Decreased step length - right;Decreased step  length - left;Shuffle;Trunk flexed Gait velocity: decr General Gait Details: cues for sequence, posture, position from RW - ltd by c/o dizziness    Exercises Total Joint Exercises Ankle Circles/Pumps: AROM;Both;15 reps;Supine Quad Sets: AROM;10 reps;Supine;Both Heel Slides: AAROM;Right;10 reps;Supine Straight Leg Raises: AAROM;Right;10 reps;Supine   PT Diagnosis: Difficulty walking  PT Problem List: Decreased strength;Decreased range of motion;Decreased activity tolerance;Decreased mobility;Decreased knowledge of use of DME;Pain PT Treatment Interventions: DME instruction;Gait training;Stair training;Functional mobility training;Therapeutic activities;Therapeutic exercise;Patient/family education     PT Goals(Current goals can be found in the care plan section) Acute Rehab PT Goals Patient Stated Goal: Resume previous lifestyle with decreased pain PT Goal Formulation: With patient Time For Goal Achievement: 01/26/14 Potential to Achieve Goals: Good  Visit Information  Last PT Received On: 01/19/14 Assistance Needed: +2 (Pt dizzy with OOB activity)       Prior Functioning  Home Living Family/patient expects to be discharged to:: Private residence Living Arrangements: Spouse/significant other Available Help at Discharge: Family Type of Home: House Home Access: Stairs to enter Technical brewer of Steps: 5 Entrance Stairs-Rails: Right Home Layout: Able to live on main level with bedroom/bathroom Home Equipment: Gilford Rile - 2 wheels;Crutches Prior Function Level of Independence: Independent Communication Communication: No difficulties    Cognition  Cognition Arousal/Alertness: Awake/alert Behavior During Therapy: WFL for tasks assessed/performed Overall Cognitive Status: Within Functional Limits for tasks assessed    Extremity/Trunk Assessment Upper Extremity Assessment Upper Extremity Assessment: Overall WFL for tasks assessed Lower Extremity Assessment Lower  Extremity Assessment: RLE deficits/detail RLE Deficits / Details: 2+/5 quads with AAROM at knee - 10 - 45 Cervical / Trunk Assessment Cervical / Trunk Assessment: Normal   Balance    End of Session PT -  End of Session Equipment Utilized During Treatment: Gait belt;Right knee immobilizer Activity Tolerance: Patient tolerated treatment well;Other (comment) (ltd by dizziness) Patient left: in chair;with call bell/phone within reach;with family/visitor present Nurse Communication: Mobility status CPM Right Knee CPM Right Knee: Off  GP     Valor Quaintance 01/19/2014, 5:36 PM

## 2014-01-20 LAB — CBC
HCT: 36.9 % — ABNORMAL LOW (ref 39.0–52.0)
HEMOGLOBIN: 11.8 g/dL — AB (ref 13.0–17.0)
MCH: 29.7 pg (ref 26.0–34.0)
MCHC: 32 g/dL (ref 30.0–36.0)
MCV: 92.9 fL (ref 78.0–100.0)
PLATELETS: 185 10*3/uL (ref 150–400)
RBC: 3.97 MIL/uL — AB (ref 4.22–5.81)
RDW: 13.3 % (ref 11.5–15.5)
WBC: 14.4 10*3/uL — ABNORMAL HIGH (ref 4.0–10.5)

## 2014-01-20 LAB — BASIC METABOLIC PANEL
BUN: 13 mg/dL (ref 6–23)
CO2: 27 mEq/L (ref 19–32)
Calcium: 8.3 mg/dL — ABNORMAL LOW (ref 8.4–10.5)
Chloride: 102 mEq/L (ref 96–112)
Creatinine, Ser: 0.65 mg/dL (ref 0.50–1.35)
GFR calc Af Amer: >90 mL/min (ref >90)
GFR calc non Af Amer: >90 mL/min (ref >90)
GLUCOSE: 116 mg/dL — AB (ref 70–99)
POTASSIUM: 3.7 meq/L (ref 3.7–5.3)
SODIUM: 139 meq/L (ref 137–147)

## 2014-01-20 MED ORDER — ASPIRIN 325 MG PO TBEC: 325.0000 mg | DELAYED_RELEASE_TABLET | Freq: Two times a day (BID) | ORAL | Status: DC

## 2014-01-20 MED ORDER — OXYCODONE-ACETAMINOPHEN 5-325 MG PO TABS: 1.0000 | ORAL_TABLET | ORAL | Status: DC | PRN

## 2014-01-20 MED ORDER — METHOCARBAMOL 500 MG PO TABS: 500.0000 mg | ORAL_TABLET | Freq: Four times a day (QID) | ORAL | Status: DC | PRN

## 2014-01-20 NOTE — Progress Notes (Signed)
Physical Therapy Treatment Patient Details Name: [name] MRN: [id number] DOB: 1956-04-22 Today's Date: 01/20/2014 Time: 1127-1201 PT Time Calculation (min): 34 min  PT Assessment / Plan / Recommendation  History of Present Illness s/p R TKA   PT Comments     Follow Up Recommendations  Home health PT     Does the patient have the potential to tolerate intense rehabilitation     Barriers to Discharge        Equipment Recommendations  None recommended by PT    Recommendations for Other Services OT consult  Frequency 7X/week   Progress towards PT Goals Progress towards PT goals: Progressing toward goals  Plan Current plan remains appropriate    Precautions / Restrictions Precautions Precautions: Knee;Fall Required Braces or Orthoses: Knee Immobilizer - Right Knee Immobilizer - Right: Discontinue once straight leg raise with < 10 degree lag Restrictions Weight Bearing Restrictions: No Other Position/Activity Restrictions: WBAT   Pertinent Vitals/Pain 7/10; premed, cold packs provided    Mobility  Bed Mobility Overal bed mobility: Needs Assistance Bed Mobility: Supine to Sit Supine to sit: Min assist General bed mobility comments: cues for sequence and use of L LE to self assist  Transfers Overall transfer level: Needs assistance Equipment used: Rolling walker (2 wheeled) Transfers: Sit to/from Stand Sit to Stand: Min assist General transfer comment: cues for LE management and use of UEs to self assist Ambulation/Gait Ambulation/Gait assistance: Min assist Ambulation Distance (Feet): 122 Feet Assistive device: Rolling walker (2 wheeled) Gait Pattern/deviations: Step-to pattern;Decreased step length - right;Decreased step length - left;Shuffle;Antalgic;Trunk flexed Gait velocity: decr General Gait Details: cues for sequence, posture, position from RW - ltd by c/o dizziness    Exercises Total Joint Exercises Ankle Circles/Pumps: AROM;Both;15 reps;Supine Quad  Sets: AROM;10 reps;Supine;Both Heel Slides: AAROM;Right;10 reps;Supine Straight Leg Raises: AAROM;Right;10 reps;Supine   PT Diagnosis:    PT Problem List:   PT Treatment Interventions:     PT Goals (current goals can now be found in the care plan section) Acute Rehab PT Goals Patient Stated Goal: Resume previous lifestyle with decreased pain PT Goal Formulation: With patient Time For Goal Achievement: 01/26/14 Potential to Achieve Goals: Good  Visit Information  Last PT Received On: 01/20/14 Assistance Needed: +1 PT/OT/SLP Co-Evaluation/Treatment: Yes History of Present Illness: s/p R TKA    Subjective Data  Subjective: Pain was awful this morning but doing better now Patient Stated Goal: Resume previous lifestyle with decreased pain   Cognition  Cognition Arousal/Alertness: Awake/alert Behavior During Therapy: WFL for tasks assessed/performed Overall Cognitive Status: Within Functional Limits for tasks assessed    Balance     End of Session PT - End of Session Equipment Utilized During Treatment: Gait belt;Right knee immobilizer Activity Tolerance: Patient tolerated treatment well Patient left: in chair;with call bell/phone within reach;with family/visitor present Nurse Communication: Mobility status   GP     [name] 01/20/2014, 12:46 PM

## 2014-01-20 NOTE — Progress Notes (Signed)
Occupational Therapy Evaluation Patient Details Name: [name] MRN: [id number] DOB: December 19, 1955 Today's Date: 01/20/2014 Time: 7253-6644 OT Time Calculation (min): 22 min  OT Assessment / Plan / Recommendation History of present illness s/p R TKA   Clinical Impression   PAtient presents to OT with decreased ADL independence s/p R TKA. Will benefit from skilled OT to maximize independence prior to d/c home with wife.    OT Assessment  Patient needs continued OT Services    Follow Up Recommendations  Supervision/Assistance - 24 hour    Barriers to Discharge      Equipment Recommendations  None recommended by OT (pt has all needed DME)    Recommendations for Other Services    Frequency  Min 2X/week    Precautions / Restrictions Precautions Precautions: Knee;Fall Required Braces or Orthoses: Knee Immobilizer - Right Knee Immobilizer - Right: Discontinue once straight leg raise with < 10 degree lag Restrictions Weight Bearing Restrictions: No Other Position/Activity Restrictions: WBAT   Pertinent Vitals/Pain C/o pain R knee    ADL  Eating/Feeding: Simulated;Independent Where Assessed - Eating/Feeding: Chair Grooming: Simulated;Wash/dry hands;Wash/dry face Where Assessed - Grooming: Supported sitting Lower Body Bathing: Simulated;Minimal assistance Where Assessed - Lower Body Bathing: Unsupported sitting Lower Body Dressing: Simulated;Moderate assistance;Minimal assistance Where Assessed - Lower Body Dressing: Supported sit to stand Toilet Transfer: Performed;Min Psychiatric nurse Method: Sit to stand;Stand Ecologist: Comfort height toilet;Bedside commode Tub/Shower Transfer: Performed;Min guard Tub/Shower Transfer Method: Stand Geophysicist/field seismologist: Walk in shower Equipment Used: Knee Immobilizer;Rolling walker Transfers/Ambulation Related to ADLs: Patient doing much better today. Min guard Ewing approaching S with all  mobility. Occasional safety cues. ADL Comments: Educated pt on LB bathing and dressing techniques. Discussed pt's preference for wide BSC, but likely insurance will not cover due to his weight and the fact that insurance has already paid for standard one recently. Pt's wife will assist as needed with ADLS    OT Diagnosis: Acute pain  OT Problem List: Decreased range of motion;Decreased knowledge of use of DME or AE;Pain OT Treatment Interventions: Self-care/ADL training;DME and/or AE instruction;Therapeutic activities;Patient/family education   OT Goals(Current goals can be found in the care plan section) Acute Rehab OT Goals Patient Stated Goal: Resume previous lifestyle with decreased pain OT Goal Formulation: With patient Time For Goal Achievement: 01/27/14 Potential to Achieve Goals: Good  Visit Information  Last OT Received On: 01/20/14 Assistance Needed: +1 History of Present Illness: s/p R TKA       Prior Functioning     Home Living Family/patient expects to be discharged to:: Private residence Living Arrangements: Spouse/significant other Available Help at Discharge: Family Type of Home: House Home Access: Stairs to enter Technical brewer of Steps: 5 Entrance Stairs-Rails: Right Home Layout: Able to live on main level with bedroom/bathroom Home Equipment: Walker - 2 wheels;Crutches;Bedside commode;Shower seat Prior Function Level of Independence: Independent Communication Communication: No difficulties         Vision/Perception     Cognition  Cognition Arousal/Alertness: Awake/alert Behavior During Therapy: WFL for tasks assessed/performed Overall Cognitive Status: Within Functional Limits for tasks assessed    Extremity/Trunk Assessment Upper Extremity Assessment Upper Extremity Assessment: Overall WFL for tasks assessed Lower Extremity Assessment Lower Extremity Assessment: Defer to PT evaluation     End of Session OT - End of  Session Equipment Utilized During Treatment: Rolling walker;Right knee immobilizer Activity Tolerance: Patient tolerated treatment well Patient left: in chair;with call bell/phone within reach;with family/visitor present;Other (comment) (with PT  to do exercises)  GO     [name] 01/20/2014, 11:56 AM

## 2014-01-20 NOTE — Progress Notes (Signed)
Physical Therapy Treatment Patient Details Name: [name] MRN: [id number] DOB: [birth year] Today's Date: 01/20/2014 Time: 1340-1400 PT Time Calculation (min): 20 min  PT Assessment / Plan / Recommendation  History of Present Illness s/p R TKA   PT Comments     Follow Up Recommendations  Home health PT     Does the patient have the potential to tolerate intense rehabilitation     Barriers to Discharge        Equipment Recommendations  None recommended by PT    Recommendations for Other Services OT consult  Frequency 7X/week   Progress towards PT Goals Progress towards PT goals: Progressing toward goals  Plan Current plan remains appropriate    Precautions / Restrictions Precautions Precautions: Knee;Fall Required Braces or Orthoses: Knee Immobilizer - Right Knee Immobilizer - Right: Discontinue once straight leg raise with < 10 degree lag Restrictions Weight Bearing Restrictions: No Other Position/Activity Restrictions: WBAT   Pertinent Vitals/Pain 6/10; premed, ice packs provided    Mobility  Bed Mobility Overal bed mobility: Needs Assistance Bed Mobility: Sit to Supine Supine to sit: Min assist Sit to supine: Min assist General bed mobility comments: cues for sequence and use of L LE to self assist  Transfers Overall transfer level: Needs assistance Equipment used: Rolling walker (2 wheeled) Transfers: Sit to/from Stand Sit to Stand: Min assist General transfer comment: cues for LE management and use of UEs to self assist Ambulation/Gait Ambulation/Gait assistance: Min assist;Min guard Ambulation Distance (Feet): 120 Feet Assistive device: Rolling walker (2 wheeled) Gait Pattern/deviations: Step-to pattern;Shuffle;Antalgic;Trunk flexed Gait velocity: decr General Gait Details: cues for sequence, posture, position from RW and stride length    Exercises Total Joint Exercises Ankle Circles/Pumps: AROM;Both;15 reps;Supine Quad Sets: AROM;10  reps;Supine;Both Heel Slides: AAROM;Right;10 reps;Supine Straight Leg Raises: AAROM;Right;10 reps;Supine   PT Diagnosis:    PT Problem List:   PT Treatment Interventions:     PT Goals (current goals can now be found in the care plan section) Acute Rehab PT Goals Patient Stated Goal: Resume previous lifestyle with decreased pain PT Goal Formulation: With patient Time For Goal Achievement: 01/26/14 Potential to Achieve Goals: Good  Visit Information  Last PT Received On: 01/20/14 Assistance Needed: +1 PT/OT/SLP Co-Evaluation/Treatment: Yes History of Present Illness: s/p R TKA    Subjective Data  Subjective: Pain was awful this morning but doing better now Patient Stated Goal: Resume previous lifestyle with decreased pain   Cognition  Cognition Arousal/Alertness: Awake/alert Behavior During Therapy: WFL for tasks assessed/performed Overall Cognitive Status: Within Functional Limits for tasks assessed    Balance     End of Session PT - End of Session Equipment Utilized During Treatment: Gait belt;Right knee immobilizer Activity Tolerance: Patient tolerated treatment well Patient left: in bed;with call bell/phone within reach;with family/visitor present Nurse Communication: Mobility status   GP     [name] 01/20/2014, 2:18 PM

## 2014-01-20 NOTE — Progress Notes (Signed)
   Subjective:  Patient reports pain as moderate.  Responded well to pain meds.  Did have significant pain o/n.  Objective:   VITALS:   Filed Vitals:   01/20/14 0138 01/20/14 0355 01/20/14 0515 01/20/14 0800  BP: 166/92  166/95   Pulse: 77  74   Temp: 98.3 F (36.8 C)  98.4 F (36.9 C)   TempSrc: Oral  Oral   Resp: 16 16 18 18   Height:      Weight:      SpO2: 100%  100% 100%    Neurologically intact Neurovascular intact Sensation intact distally Intact pulses distally Dorsiflexion/Plantar flexion intact Incision: dressing C/D/I and no drainage No cellulitis present Compartment soft   Lab Results  Component Value Date   WBC 14.4* 01/20/2014   HGB 11.8* 01/20/2014   HCT 36.9* 01/20/2014   MCV 92.9 01/20/2014   PLT 185 01/20/2014     Assessment/Plan: 1 Day Post-Op   Problem List Items Addressed This Visit   None      Expected postop acute blood loss anemia - will monitor for symptoms Up with PT/OT DVT ppx - SCDs, ambulation, asa HVAC pulled WBAT right and lower extremity Pain control Discharge planning   Marianna Payment 01/20/2014, 10:04 AM [phone]

## 2014-01-20 NOTE — Progress Notes (Signed)
   CARE MANAGEMENT NOTE 01/20/2014  Patient:  XAVIAN, HARDCASTLE   Account Number:  [id number]  Date Initiated:  01/20/2014  Documentation initiated by:  [facility]  Subjective/Objective Assessment:   Right total knee arthroplasty     Action/Plan:   [facility]   Anticipated DC Date:  01/21/2014   Anticipated DC Plan:  [place]  CM consult      [facility] Choice  HOME HEALTH   Choice offered to / List presented to:  C-1 Patient        [employer] arranged  HH-2 PT      [place]   Status of service:  Completed, signed off Medicare Important Message given?   (If response is "NO", the following Medicare IM given date fields will be blank) Date Medicare IM given:   Date Additional Medicare IM given:    Discharge Disposition:  [place]  Per UR Regulation:    If discussed at Long Length of Stay Meetings, dates discussed:    Comments:  01/20/2014 1320 NCM spoke to pt and offered choice for [facility]. Pt agreeable to [facility] for [facility]. States he has RW and 3n1 at home. Notified [facility] of plan for home with [facility]. Jonnie Finner RN CCM Case Mgmt phone [phone]

## 2014-01-21 LAB — CBC
HEMATOCRIT: 33 % — AB (ref 39.0–52.0)
HEMOGLOBIN: 11.2 g/dL — AB (ref 13.0–17.0)
MCH: 31.3 pg (ref 26.0–34.0)
MCHC: 33.9 g/dL (ref 30.0–36.0)
MCV: 92.2 fL (ref 78.0–100.0)
Platelets: 164 10*3/uL (ref 150–400)
RBC: 3.58 MIL/uL — AB (ref 4.22–5.81)
RDW: 13.3 % (ref 11.5–15.5)
WBC: 8.4 10*3/uL (ref 4.0–10.5)

## 2014-01-21 NOTE — Progress Notes (Signed)
Subjective:  Patient reports pain as much improved.    Objective:   VITALS:   Filed Vitals:   01/21/14 0000 01/21/14 0400 01/21/14 0512 01/21/14 0800  BP:   129/70   Pulse:   69   Temp:   98.4 F (36.9 C)   TempSrc:   Oral   Resp: 18 12 14 14   Height:      Weight:      SpO2: 98% 99% 94% 94%    Dressing c/d/i Drain site hemostatic   Lab Results  Component Value Date   WBC 8.4 01/21/2014   HGB 11.2* 01/21/2014   HCT 33.0* 01/21/2014   MCV 92.2 01/21/2014   PLT 164 01/21/2014     Assessment/Plan: 2 Days Post-Op   Problem List Items Addressed This Visit     Musculoskeletal and Integument   *Arthritis of knee, right - Primary   Relevant Orders      Call MD / Call 911      Diet - low sodium heart healthy      Constipation Prevention      Increase activity slowly as tolerated      Discharge instructions      Call MD / Call 911      Diet - low sodium heart healthy      Constipation Prevention      Increase activity slowly as tolerated      Driving restrictions      TED hose      Do not put a pillow under the knee. Place it under the heel.     Other   Status post total knee replacement   Relevant Orders      Call MD / Call 911      Diet - low sodium heart healthy      Constipation Prevention      Increase activity slowly as tolerated      Discharge instructions      Expected postop acute blood loss anemia - will monitor for symptoms Up with PT/OT DVT ppx - SCDs, ambulation, asa WBAT right and lower extremity CPM while in house Pain control Discharge planning - dc home today after PT   Marianna Payment 01/21/2014, 9:19 AM [phone]

## 2014-01-21 NOTE — Plan of Care (Signed)
Problem: Discharge Progression Outcomes Goal: Anticoagulant follow-up in place Outcome: Not Applicable Date Met:  81/44/81 asa

## 2014-01-21 NOTE — Discharge Summary (Signed)
Patient ID: [name] MRN: [id number] DOB/AGE: Nov 10, 1955 58 y.o.  Admit date: 01/19/2014 Discharge date: 01/21/2014  Admission Diagnoses:  Principal Problem:   Arthritis of knee, right Active Problems:   Status post total knee replacement   Discharge Diagnoses:  Same  Past Medical History  Diagnosis Date  . Hypertension     takes Cardura and NOrvasc daily  . Arthritis     knees  . History of colon polyps   . GERD (gastroesophageal reflux disease)     hx of, not freq  . Seizures     had one approx. 30 yrs ago,has not had any since    Surgeries: Procedure(s): RIGHT TOTAL KNEE ARTHROPLASTY, Steroid injection left knee on 01/19/2014   Consultants:    Discharged Condition: Improved  Hospital Course: [name] is an 58 y.o. male who was admitted 01/19/2014 for operative treatment ofArthritis of knee, right. Patient has severe unremitting pain that affects sleep, daily activities, and work/hobbies. After pre-op clearance the patient was taken to the operating room on 01/19/2014 and underwent  Procedure(s): RIGHT TOTAL KNEE ARTHROPLASTY, Steroid injection left knee.    Patient was given perioperative antibiotics: Anti-infectives   Start     Dose/Rate Route Frequency Ordered Stop   01/19/14 1400  ceFAZolin (ANCEF) IVPB 2 g/50 mL premix     2 g 100 mL/hr over 30 Minutes Intravenous Every 6 hours 01/19/14 1118 01/19/14 1957   01/19/14 0556  ceFAZolin (ANCEF) IVPB 2 g/50 mL premix     2 g 100 mL/hr over 30 Minutes Intravenous On call to O.R. 01/19/14 7253 01/19/14 6644       Patient was given sequential compression devices, early ambulation, and chemoprophylaxis to prevent DVT.  Patient benefited maximally from hospital stay and there were no complications.    Recent vital signs: Patient Vitals for the past 24 hrs:  BP Temp Temp src Pulse Resp SpO2  01/21/14 0800 - - - - 14 94 %  01/21/14 0512 129/70 mmHg 98.4 F (36.9 C) Oral 69 14 94 %  01/21/14 0400 - - - -  12 99 %  01/21/14 0000 - - - - 18 98 %  01/20/14 2112 151/78 mmHg 98.4 F (36.9 C) Oral 70 18 97 %  01/20/14 2000 - - - - 16 98 %     Recent laboratory studies:  Recent Labs  01/20/14 0458 01/21/14 0525  WBC 14.4* 8.4  HGB 11.8* 11.2*  HCT 36.9* 33.0*  PLT 185 164  NA 139  --   K 3.7  --   CL 102  --   CO2 27  --   BUN 13  --   CREATININE 0.65  --   GLUCOSE 116*  --   CALCIUM 8.3*  --      Discharge Medications:     Medication List    STOP taking these medications       HYDROcodone-acetaminophen 7.5-325 MG per tablet  Commonly known as:  NORCO     ibuprofen 200 MG tablet  Commonly known as:  ADVIL,MOTRIN      TAKE these medications       aspirin 325 MG EC tablet  Take 1 tablet (325 mg total) by mouth 2 (two) times daily after a meal.     doxazosin 4 MG tablet  Commonly known as:  CARDURA  Take 4 mg by mouth at bedtime.     finasteride 5 MG tablet  Commonly known as:  PROSCAR  Take  2.5 mg by mouth every other day.     lisinopril 10 MG tablet  Commonly known as:  PRINIVIL,ZESTRIL  Take 10 mg by mouth every morning.     methocarbamol 500 MG tablet  Commonly known as:  ROBAXIN  Take 1 tablet (500 mg total) by mouth every 6 (six) hours as needed for muscle spasms.     multivitamin with minerals Tabs tablet  Take 1 tablet by mouth every morning.     NORVASC 10 MG tablet  Generic drug:  amLODipine  Take 10 mg by mouth every morning.     OVER THE COUNTER MEDICATION  Take 1 capsule by mouth every morning. Stool softener     oxyCODONE-acetaminophen 5-325 MG per tablet  Commonly known as:  ROXICET  Take 1-2 tablets by mouth every 4 (four) hours as needed for severe pain.        Diagnostic Studies: Dg Chest 2 View  01/15/2014   CLINICAL DATA:  Preoperative for right knee arthroplasty, history of hypertension  EXAM: CHEST  2 VIEW  COMPARISON:  PA and lateral chest x-ray of June 17, 2012  FINDINGS: The lungs are adequately inflated and clear. The  cardiac silhouette is normal in size. The pulmonary vascularity is not engorged. There is tortuosity of the descending thoracic aorta. There is no pleural effusion or pneumothorax. The observed portions of the bony thorax exhibit no acute abnormalities.  IMPRESSION: There is no active cardiopulmonary disease.   Electronically Signed   By: David  Martinique   On: 01/15/2014 13:45   Dg Knee Right Port  01/19/2014   CLINICAL DATA:  Postop right total knee replacement.  EXAM: PORTABLE RIGHT KNEE - 1-2 VIEW  COMPARISON:  None.  FINDINGS: Sequelae of right total knee arthroplasty are identified. The components appear normally aligned. A surgical drain is in place. There is overlying soft tissue emphysema. No fracture is identified.  IMPRESSION: Unremarkable appearance of right total knee arthroplasty.  These results were called by telephone at the time of interpretation on 01/19/2014 at 10:35 AM to [facility] in PACU who verbally acknowledged these results.   Electronically Signed   By: Logan Bores   On: 01/19/2014 10:36    Disposition: 01-Home or Self Care      Discharge Orders   Future Orders Complete By Expires   Call MD / Call 911  As directed    Comments:     If you experience chest pain or shortness of breath, CALL 911 and be transported to the hospital emergency room.  If you develope a fever above 101 F, pus (white drainage) or increased drainage or redness at the wound, or calf pain, call your surgeon's office.   Call MD / Call 911  As directed    Comments:     If you experience chest pain or shortness of breath, CALL 911 and be transported to the hospital emergency room.  If you develope a fever above 101.5 F, pus (white drainage) or increased drainage or redness at the wound, or calf pain, call your surgeon's office.   Constipation Prevention  As directed    Comments:     Drink plenty of fluids.  Prune juice may be helpful.  You may use a stool softener, such as Colace (over the counter) 100 mg  twice a day.  Use MiraLax (over the counter) for constipation as needed.   Constipation Prevention  As directed    Comments:     Drink plenty of fluids.  Prune juice may be helpful.  You may use a stool softener, such as Colace (over the counter) 100 mg twice a day.  Use MiraLax (over the counter) for constipation as needed.   Diet - low sodium heart healthy  As directed    Diet - low sodium heart healthy  As directed    Discharge instructions  As directed    Comments:     Increase your activities as comfort allows. Ice and elevation as needed for swelling. Pump your feet several times throughout the day. Wait to get your actual incision wet in the shower for 6 days. Get an over-the-counter stool softener to take twice daily.   Do not put a pillow under the knee. Place it under the heel.  As directed    Driving restrictions  As directed    Comments:     No driving while taking narcotic pain meds.   Increase activity slowly as tolerated  As directed    Increase activity slowly as tolerated  As directed    TED hose  As directed    Comments:     Use stockings (TED hose) for 6 weeks on both leg(s).  You may remove them at night for sleeping.      Follow-up Information   Follow up with [facility])    Contact information:   [address] [place] [address] [phone]       Follow up with Mcarthur Rossetti, MD In 2 weeks.   Specialty:  Orthopedic Surgery   Contact information:   [place] [address] [phone]        Signed: Mcarthur Rossetti 01/21/2014, 5:19 PM

## 2014-01-21 NOTE — Progress Notes (Signed)
Physical Therapy Treatment Patient Details Name: [name] MRN: [id number] DOB: Mar 23, 1956 Today's Date: 01/21/2014 Time: 5956-3875 PT Time Calculation (min): 29 min  PT Assessment / Plan / Recommendation  History of Present Illness s/p R TKA   PT Comments   *Good progress with mobility. Stair training completed. REady to DC home from PT standpoint.**  Follow Up Recommendations  Home health PT     Does the patient have the potential to tolerate intense rehabilitation     Barriers to Discharge        Equipment Recommendations  None recommended by PT    Recommendations for Other Services OT consult  Frequency 7X/week   Progress towards PT Goals Progress towards PT goals: Progressing toward goals  Plan Current plan remains appropriate    Precautions / Restrictions Precautions Precautions: Knee;Fall Required Braces or Orthoses: Knee Immobilizer - Right Knee Immobilizer - Right: Discontinue once straight leg raise with < 10 degree lag Restrictions Weight Bearing Restrictions: No Other Position/Activity Restrictions: WBAT   Pertinent Vitals/Pain *7/10 R knee with walking Premedicated Ice applied*    Mobility  Bed Mobility Overal bed mobility: Needs Assistance Bed Mobility: Sit to Supine;Supine to Sit Supine to sit: Supervision Sit to supine: Supervision General bed mobility comments: instructed in self assist with LLE Transfers Overall transfer level: Needs assistance Equipment used: Rolling walker (2 wheeled) Transfers: Sit to/from Stand Sit to Stand: Modified independent (Device/Increase time) Ambulation/Gait Ambulation/Gait assistance: Modified independent (Device/Increase time) Ambulation Distance (Feet): 200 Feet Assistive device: Rolling walker (2 wheeled) Gait Pattern/deviations: Step-to pattern Gait velocity: decr General Gait Details: good posture and stride length Stairs: Yes Stairs assistance: Supervision Stair Management: One rail Right;With  crutches Number of Stairs: 3 General stair comments: VCs sequencing    Exercises Total Joint Exercises Ankle Circles/Pumps: AROM;Both;15 reps;Supine Quad Sets: AROM;10 reps;Supine;Both Short Arc Quad: AAROM;20 reps;Right Heel Slides: AAROM;Right;10 reps;Supine Straight Leg Raises: AAROM;Right;10 reps;Supine Goniometric ROM: 50* R knee flexion AAROM, 0* ext in supine   PT Diagnosis:    PT Problem List:   PT Treatment Interventions:     PT Goals (current goals can now be found in the care plan section) Acute Rehab PT Goals 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 PT Goal Formulation: With patient/family Time For Goal Achievement: 01/26/14 Potential to Achieve Goals: Good  Visit Information  Last PT Received On: 01/21/14 Assistance Needed: +1 History of Present Illness: s/p R TKA    Subjective Data  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

## 2014-01-21 NOTE — Progress Notes (Signed)
Occupational Therapy Treatment Patient Details Name: [name] MRN: [id number] DOB: May 05, 1956 Today's Date: 01/21/2014 Time: 4451-4604 OT Time Calculation (min): 8 min  OT Assessment / Plan / Recommendation  History of present illness s/p R TKA   OT comments  All education completed and pt to d/c home today with wife's assistance. D/c OT.  Follow Up Recommendations  Supervision - Intermittent    Barriers to Discharge       Equipment Recommendations  None recommended by OT (pt has all needed DME)    Recommendations for Other Services    Frequency     Progress towards OT Goals Progress towards OT goals: Goals met/education completed, patient discharged from [facility] All goals met and education completed, patient discharged from OT services    Precautions / Restrictions Precautions Precautions: Knee;Fall Required Braces or Orthoses: Knee Immobilizer - Right Knee Immobilizer - Right: Discontinue once straight leg raise with < 10 degree lag Restrictions Weight Bearing Restrictions: No Other Position/Activity Restrictions: WBAT   Pertinent Vitals/Pain Pt c/o pain, requesting pain meds. RN made aware.    ADL  Transfers/Ambulation Related to ADLs: Patient reports he used BSC over toilet last evening without difficulty. Has decided to get toilet riser and pair it with PVC rails his father built for him as opposed to purchasing a wide BSC. Patient has no questions about shower transfer.  ADL Comments: Pt's main concern is getting pain medication this am. Informed RN.    OT Diagnosis:    OT Problem List:   OT Treatment Interventions:     OT Goals(current goals can now be found in the care plan section)    Visit Information  Last OT Received On: 01/21/14 Assistance Needed: +1 History of Present Illness: s/p R TKA    Subjective Data      Prior Functioning       Cognition  Cognition Arousal/Alertness: Awake/alert Behavior During Therapy: WFL for tasks  assessed/performed Overall Cognitive Status: Within Functional Limits for tasks assessed    End of Session OT - End of Session Activity Tolerance: Patient tolerated treatment well Patient left: in bed;with call bell/phone within reach;with family/visitor present Nurse Communication: Patient requests pain meds CPM Right Knee CPM Right Knee: Off  GO     [name] A 01/21/2014, 10:00 AM

## 2014-01-22 ENCOUNTER — Encounter (HOSPITAL_COMMUNITY): Payer: Self-pay | Admitting: Orthopaedic Surgery

## 2014-10-11 ENCOUNTER — Other Ambulatory Visit (HOSPITAL_COMMUNITY): Payer: Self-pay | Admitting: Orthopaedic Surgery

## 2014-10-17 ENCOUNTER — Encounter (HOSPITAL_COMMUNITY): Payer: Self-pay

## 2014-10-17 ENCOUNTER — Encounter (HOSPITAL_COMMUNITY)
Admission: RE | Admit: 2014-10-17 | Discharge: 2014-10-17 | Disposition: A | Discharge status: Home / Self Care | Payer: BC Managed Care – PPO | Source: Ambulatory Visit | Attending: Orthopaedic Surgery | Admitting: Orthopaedic Surgery

## 2014-10-17 DIAGNOSIS — Z01812 Encounter for preprocedural laboratory examination: Secondary | ICD-10-CM | POA: Diagnosis present

## 2014-10-17 HISTORY — DX: Cardiac murmur, unspecified: R01.1

## 2014-10-17 LAB — SURGICAL PCR SCREEN
MRSA, PCR: NEGATIVE
STAPHYLOCOCCUS AUREUS: POSITIVE — AB

## 2014-10-17 LAB — PROTIME-INR
INR: 0.98 (ref 0.00–1.49)
Prothrombin Time: 13.1 seconds (ref 11.6–15.2)

## 2014-10-17 LAB — CBC
HEMATOCRIT: 42.1 % (ref 39.0–52.0)
Hemoglobin: 13.6 g/dL (ref 13.0–17.0)
MCH: 30.4 pg (ref 26.0–34.0)
MCHC: 32.3 g/dL (ref 30.0–36.0)
MCV: 94.2 fL (ref 78.0–100.0)
Platelets: 145 10*3/uL — ABNORMAL LOW (ref 150–400)
RBC: 4.47 MIL/uL (ref 4.22–5.81)
RDW: 13.4 % (ref 11.5–15.5)
WBC: 6.7 10*3/uL (ref 4.0–10.5)

## 2014-10-17 LAB — URINALYSIS, ROUTINE W REFLEX MICROSCOPIC
BILIRUBIN URINE: NEGATIVE
Glucose, UA: NEGATIVE mg/dL
HGB URINE DIPSTICK: NEGATIVE
KETONES UR: NEGATIVE mg/dL
Leukocytes, UA: NEGATIVE
Nitrite: NEGATIVE
Protein, ur: NEGATIVE mg/dL
SPECIFIC GRAVITY, URINE: 1.017 (ref 1.005–1.030)
UROBILINOGEN UA: 0.2 mg/dL (ref 0.0–1.0)
pH: 7.5 (ref 5.0–8.0)

## 2014-10-17 LAB — BASIC METABOLIC PANEL
Anion gap: 10 (ref 5–15)
BUN: 17 mg/dL (ref 6–23)
CALCIUM: 8.9 mg/dL (ref 8.4–10.5)
CO2: 26 meq/L (ref 19–32)
Chloride: 98 mEq/L (ref 96–112)
Creatinine, Ser: 0.68 mg/dL (ref 0.50–1.35)
GFR calc Af Amer: >90 mL/min (ref >90)
GFR calc non Af Amer: >90 mL/min (ref >90)
Glucose, Bld: 94 mg/dL (ref 70–99)
Potassium: 4.2 mEq/L (ref 3.7–5.3)
SODIUM: 134 meq/L — AB (ref 137–147)

## 2014-10-17 LAB — APTT: aPTT: 33 seconds (ref 24–37)

## 2014-10-17 NOTE — Patient Instructions (Addendum)
JACOBE STUDY  10/17/2014   Your procedure is scheduled on: Tuesday  December 22nd  Report to [place] at 5:30 AM.  Call this number if you have problems the morning of surgery [phone]   Remember:  Do not eat food or drink liquids :After Midnight.     Take these medicines the morning of surgery with A SIP OF WATER:   AMLODIPINE,  HYDROCODONE / ACETAMINOPHEN FOR PAIN IF NEEDED                               You may not have any metal on your body including hair pins and              piercings  Do not wear jewelry, make-up, lotions, powders or perfumes.             Do not wear nail polish.  Do not shave  48 hours prior to surgery.              Men may shave face and neck.   Do not bring valuables to the hospital. [place].  Contacts, dentures or bridgework may not be worn into surgery.  Leave suitcase in the car. After surgery it may be brought to your room.     Patients discharged the day of surgery will not be allowed to drive home.  Name and phone number of your driver:   PT TO BE ADMITTED AFTER SURGERY  Special Instructions: N/A              Please read over the following fact sheets you were given: _____________________________________________________________________             [facility] - Preparing for Surgery Before surgery, you can play an important role.  Because skin is not sterile, your skin needs to be as free of germs as possible.  You can reduce the number of germs on your skin by washing with CHG (chlorahexidine gluconate) soap before surgery.  CHG is an antiseptic cleaner which kills germs and bonds with the skin to continue killing germs even after washing. Please DO NOT use if you have an allergy to CHG or antibacterial soaps.  If your skin becomes reddened/irritated stop using the CHG and inform your nurse when you arrive at [facility]. Do not shave (including legs and underarms) for at least 48 hours prior to the first CHG shower.  You may shave your face/neck. Please follow these instructions carefully:  1.  Shower with CHG Soap the night before surgery and the  morning of Surgery.  2.  If you choose to wash your hair, wash your hair first as usual with your  normal  shampoo.  3.  After you shampoo, rinse your hair and body thoroughly to remove the  shampoo.                           4.  Use CHG as you would any other liquid soap.  You can apply chg directly  to the skin and wash  Gently with a scrungie or clean washcloth.  5.  Apply the CHG Soap to your body ONLY FROM THE NECK DOWN.   Do not use on face/ open                           Wound or open sores. Avoid contact with eyes, ears mouth and genitals (private parts).                       Wash face,  Genitals (private parts) with your normal soap.             6.  Wash thoroughly, paying special attention to the area where your surgery  will be performed.  7.  Thoroughly rinse your body with warm water from the neck down.  8.  DO NOT shower/wash with your normal soap after using and rinsing off  the CHG Soap.                9.  Pat yourself dry with a clean towel.            10.  Wear clean pajamas.            11.  Place clean sheets on your bed the night of your first shower and do not  sleep with pets. Day of Surgery : Do not apply any lotions/deodorants the morning of surgery.  Please wear clean clothes to the hospital/surgery center.  FAILURE TO FOLLOW THESE INSTRUCTIONS MAY RESULT IN THE CANCELLATION OF YOUR SURGERY PATIENT SIGNATURE_________________________________  NURSE SIGNATURE__________________________________  ________________________________________________________________________  WHAT IS A BLOOD TRANSFUSION? Blood Transfusion Information  A transfusion is the replacement of blood or some of its parts. Blood is made up of  multiple cells which provide different functions.  Red blood cells carry oxygen and are used for blood loss replacement.  White blood cells fight against infection.  Platelets control bleeding.  Plasma helps clot blood.  Other blood products are available for specialized needs, such as hemophilia or other clotting disorders. BEFORE THE TRANSFUSION  Who gives blood for transfusions?   Healthy volunteers who are fully evaluated to make sure their blood is safe. This is blood bank blood. Transfusion therapy is the safest it has ever been in the practice of medicine. Before blood is taken from a donor, a complete history is taken to make sure that person has no history of diseases nor engages in risky social behavior (examples are intravenous drug use or sexual activity with multiple partners). The donor's travel history is screened to minimize risk of transmitting infections, such as malaria. The donated blood is tested for signs of infectious diseases, such as HIV and hepatitis. The blood is then tested to be sure it is compatible with you in order to minimize the chance of a transfusion reaction. If you or a relative donates blood, this is often done in anticipation of surgery and is not appropriate for emergency situations. It takes many days to process the donated blood. RISKS AND COMPLICATIONS Although transfusion therapy is very safe and saves many lives, the main dangers of transfusion include:   Getting an infectious disease.  Developing a transfusion reaction. This is an allergic reaction to something in the blood you were given. Every precaution is taken to prevent this. The decision to have a blood transfusion has been considered carefully by your caregiver before blood is given. Blood is not given unless the benefits outweigh  the risks. AFTER THE TRANSFUSION  Right after receiving a blood transfusion, you will usually feel much better and more energetic. This is especially true if  your red blood cells have gotten low (anemic). The transfusion raises the level of the red blood cells which carry oxygen, and this usually causes an energy increase.  The nurse administering the transfusion will monitor you carefully for complications. HOME CARE INSTRUCTIONS  No special instructions are needed after a transfusion. You may find your energy is better. Speak with your caregiver about any limitations on activity for underlying diseases you may have. SEEK MEDICAL CARE IF:   Your condition is not improving after your transfusion.  You develop redness or irritation at the intravenous (IV) site. SEEK IMMEDIATE MEDICAL CARE IF:  Any of the following symptoms occur over the next 12 hours:  Shaking chills.  You have a temperature by mouth above 102 F (38.9 C), not controlled by medicine.  Chest, back, or muscle pain.  People around you feel you are not acting correctly or are confused.  Shortness of breath or difficulty breathing.  Dizziness and fainting.  You get a rash or develop hives.  You have a decrease in urine output.  Your urine turns a dark color or changes to pink, red, or brown. Any of the following symptoms occur over the next 10 days:  You have a temperature by mouth above 102 F (38.9 C), not controlled by medicine.  Shortness of breath.  Weakness after normal activity.  The white part of the eye turns yellow (jaundice).  You have a decrease in the amount of urine or are urinating less often.  Your urine turns a dark color or changes to pink, red, or brown. Document Released: 10/16/2000 Document Revised: 01/11/2012 Document Reviewed: 06/04/2008 [facility] Patient Information 2014 [place], [place].  _______________________________________________________________________

## 2014-10-17 NOTE — Progress Notes (Signed)
   10/17/14 0920  OBSTRUCTIVE SLEEP APNEA  Have you ever been diagnosed with sleep apnea through a sleep study? No  Do you snore loudly (loud enough to be heard through closed doors)?  0  Do you often feel tired, fatigued, or sleepy during the daytime? 0  Has anyone observed you stop breathing during your sleep? 0  Do you have, or are you being treated for high blood pressure? 1  BMI more than 35 kg/m2? 0  Age over 59 years old? 1  Neck circumference greater than 40 cm/16 inches? 1  Gender: 1  Obstructive Sleep Apnea Score 4  Score 4 or greater  Results sent to PCP

## 2014-10-17 NOTE — Pre-Procedure Instructions (Signed)
CXR AND EKG REPORTS ARE IN EPIC FROM 01/15/14 - AND WERE USED FOR PT'S RT TOTAL KNEE REPLACEMENT SURGERY ON 01/19/14.

## 2014-10-22 MED ORDER — DEXTROSE 5 % IV SOLN
3.0000 g | INTRAVENOUS | Status: AC
  Administered 2014-10-23: 3 g via INTRAVENOUS
  Filled 2014-10-22 (×2): qty 3000

## 2014-10-23 ENCOUNTER — Inpatient Hospital Stay (HOSPITAL_COMMUNITY): Payer: BC Managed Care – PPO | Admitting: Anesthesiology

## 2014-10-23 ENCOUNTER — Inpatient Hospital Stay (HOSPITAL_COMMUNITY)
Admission: RE | Admit: 2014-10-23 | Discharge: 2014-10-24 | DRG: 470 | Disposition: A | Discharge status: Home Health | Payer: BC Managed Care – PPO | Source: Ambulatory Visit | Attending: Orthopaedic Surgery | Admitting: Orthopaedic Surgery

## 2014-10-23 ENCOUNTER — Inpatient Hospital Stay (HOSPITAL_COMMUNITY): Payer: BC Managed Care – PPO

## 2014-10-23 ENCOUNTER — Encounter (HOSPITAL_COMMUNITY)
Admission: RE | Disposition: A | Discharge status: Home Health | Payer: Self-pay | Source: Ambulatory Visit | Attending: Orthopaedic Surgery

## 2014-10-23 ENCOUNTER — Encounter (HOSPITAL_COMMUNITY): Payer: Self-pay | Admitting: *Deleted

## 2014-10-23 DIAGNOSIS — R569 Unspecified convulsions: Secondary | ICD-10-CM | POA: Diagnosis present

## 2014-10-23 DIAGNOSIS — I10 Essential (primary) hypertension: Secondary | ICD-10-CM | POA: Diagnosis present

## 2014-10-23 DIAGNOSIS — Z96641 Presence of right artificial hip joint: Secondary | ICD-10-CM | POA: Diagnosis present

## 2014-10-23 DIAGNOSIS — Z7982 Long term (current) use of aspirin: Secondary | ICD-10-CM | POA: Diagnosis not present

## 2014-10-23 DIAGNOSIS — K219 Gastro-esophageal reflux disease without esophagitis: Secondary | ICD-10-CM | POA: Diagnosis present

## 2014-10-23 DIAGNOSIS — Z96651 Presence of right artificial knee joint: Secondary | ICD-10-CM | POA: Diagnosis present

## 2014-10-23 DIAGNOSIS — Z86010 Personal history of colonic polyps: Secondary | ICD-10-CM

## 2014-10-23 DIAGNOSIS — Z79899 Other long term (current) drug therapy: Secondary | ICD-10-CM

## 2014-10-23 DIAGNOSIS — R011 Cardiac murmur, unspecified: Secondary | ICD-10-CM | POA: Diagnosis present

## 2014-10-23 DIAGNOSIS — Z96652 Presence of left artificial knee joint: Secondary | ICD-10-CM

## 2014-10-23 DIAGNOSIS — M1712 Unilateral primary osteoarthritis, left knee: Principal | ICD-10-CM | POA: Diagnosis present

## 2014-10-23 HISTORY — PX: TOTAL KNEE ARTHROPLASTY: SHX125

## 2014-10-23 LAB — TYPE AND SCREEN
ABO/RH(D): O POS
ANTIBODY SCREEN: NEGATIVE

## 2014-10-23 SURGERY — ARTHROPLASTY, KNEE, TOTAL
Anesthesia: Spinal | Site: Knee | Laterality: Left

## 2014-10-23 MED ORDER — HYDROMORPHONE HCL 1 MG/ML IJ SOLN: 0.2500 mg | INTRAMUSCULAR | Status: DC | PRN

## 2014-10-23 MED ORDER — SODIUM CHLORIDE 0.9 % IV SOLN
INTRAVENOUS | Status: DC
  Administered 2014-10-23: 10:00:00 via INTRAVENOUS

## 2014-10-23 MED ORDER — LACTATED RINGERS IV SOLN
INTRAVENOUS | Status: DC | PRN
  Administered 2014-10-23 (×2): via INTRAVENOUS

## 2014-10-23 MED ORDER — OXYCODONE HCL ER 20 MG PO T12A
20.0000 mg | EXTENDED_RELEASE_TABLET | Freq: Two times a day (BID) | ORAL | Status: DC
  Administered 2014-10-23 – 2014-10-24 (×2): 20 mg via ORAL
  Filled 2014-10-23 (×2): qty 1

## 2014-10-23 MED ORDER — DOXAZOSIN MESYLATE 4 MG PO TABS
4.0000 mg | ORAL_TABLET | Freq: Every day | ORAL | Status: DC
  Administered 2014-10-23: 4 mg via ORAL
  Filled 2014-10-23 (×2): qty 1

## 2014-10-23 MED ORDER — PROPOFOL INFUSION 10 MG/ML OPTIME
INTRAVENOUS | Status: DC | PRN
  Administered 2014-10-23: 100 ug/kg/min via INTRAVENOUS

## 2014-10-23 MED ORDER — METOCLOPRAMIDE HCL 10 MG PO TABS: 5.0000 mg | ORAL_TABLET | Freq: Three times a day (TID) | ORAL | Status: DC | PRN

## 2014-10-23 MED ORDER — ACETAMINOPHEN 325 MG PO TABS
650.0000 mg | ORAL_TABLET | Freq: Four times a day (QID) | ORAL | Status: DC | PRN
  Administered 2014-10-23: 650 mg via ORAL
  Filled 2014-10-23: qty 2

## 2014-10-23 MED ORDER — 0.9 % SODIUM CHLORIDE (POUR BTL) OPTIME
TOPICAL | Status: DC | PRN
  Administered 2014-10-23: 1000 mL

## 2014-10-23 MED ORDER — EPHEDRINE SULFATE 50 MG/ML IJ SOLN
INTRAMUSCULAR | Status: AC
  Filled 2014-10-23: qty 1

## 2014-10-23 MED ORDER — PROPOFOL 10 MG/ML IV BOLUS
INTRAVENOUS | Status: AC
  Filled 2014-10-23: qty 20

## 2014-10-23 MED ORDER — HYDROMORPHONE HCL 1 MG/ML IJ SOLN
INTRAMUSCULAR | Status: AC
  Administered 2014-10-23: 1 mg
  Filled 2014-10-23: qty 1

## 2014-10-23 MED ORDER — FENTANYL CITRATE 0.05 MG/ML IJ SOLN
INTRAMUSCULAR | Status: AC
  Filled 2014-10-23: qty 2

## 2014-10-23 MED ORDER — ZOLPIDEM TARTRATE 5 MG PO TABS: 5.0000 mg | ORAL_TABLET | Freq: Every evening | ORAL | Status: DC | PRN

## 2014-10-23 MED ORDER — TRANEXAMIC ACID 100 MG/ML IV SOLN
1000.0000 mg | INTRAVENOUS | Status: AC
  Administered 2014-10-23: 1000 mg via INTRAVENOUS
  Filled 2014-10-23: qty 10

## 2014-10-23 MED ORDER — ONDANSETRON HCL 4 MG/2ML IJ SOLN
INTRAMUSCULAR | Status: AC
Start: 2014-10-23 — End: 2014-10-23
  Filled 2014-10-23: qty 2

## 2014-10-23 MED ORDER — ONDANSETRON HCL 4 MG/2ML IJ SOLN: 4.0000 mg | Freq: Four times a day (QID) | INTRAMUSCULAR | Status: DC | PRN

## 2014-10-23 MED ORDER — AMLODIPINE BESYLATE 10 MG PO TABS
10.0000 mg | ORAL_TABLET | Freq: Every day | ORAL | Status: DC
  Administered 2014-10-24: 10 mg via ORAL
  Filled 2014-10-23: qty 1

## 2014-10-23 MED ORDER — STERILE WATER FOR IRRIGATION IR SOLN
Status: DC | PRN
  Administered 2014-10-23: 1500 mL

## 2014-10-23 MED ORDER — MECLIZINE HCL 25 MG PO TABS
25.0000 mg | ORAL_TABLET | Freq: Three times a day (TID) | ORAL | Status: DC | PRN
  Filled 2014-10-23: qty 1

## 2014-10-23 MED ORDER — PROMETHAZINE HCL 25 MG/ML IJ SOLN: 6.2500 mg | INTRAMUSCULAR | Status: DC | PRN

## 2014-10-23 MED ORDER — MIDAZOLAM HCL 2 MG/2ML IJ SOLN
INTRAMUSCULAR | Status: AC
Start: 2014-10-23 — End: 2014-10-23
  Filled 2014-10-23: qty 2

## 2014-10-23 MED ORDER — SODIUM CHLORIDE 0.9 % IJ SOLN
INTRAMUSCULAR | Status: AC
  Filled 2014-10-23: qty 10

## 2014-10-23 MED ORDER — BUPIVACAINE IN DEXTROSE 0.75-8.25 % IT SOLN
INTRATHECAL | Status: DC | PRN
  Administered 2014-10-23: 2 mL via INTRATHECAL

## 2014-10-23 MED ORDER — PROPOFOL 10 MG/ML IV EMUL
INTRAVENOUS | Status: DC | PRN
  Administered 2014-10-23 (×2): 40 mg via INTRAVENOUS

## 2014-10-23 MED ORDER — METOCLOPRAMIDE HCL 5 MG/ML IJ SOLN: 5.0000 mg | Freq: Three times a day (TID) | INTRAMUSCULAR | Status: DC | PRN

## 2014-10-23 MED ORDER — ADULT MULTIVITAMIN W/MINERALS CH
1.0000 | ORAL_TABLET | Freq: Every morning | ORAL | Status: DC
  Administered 2014-10-23 – 2014-10-24 (×2): 1 via ORAL
  Filled 2014-10-23 (×2): qty 1

## 2014-10-23 MED ORDER — FENTANYL CITRATE 0.05 MG/ML IJ SOLN
INTRAMUSCULAR | Status: DC | PRN
  Administered 2014-10-23 (×2): 50 ug via INTRAVENOUS

## 2014-10-23 MED ORDER — DOCUSATE SODIUM 100 MG PO CAPS
100.0000 mg | ORAL_CAPSULE | Freq: Two times a day (BID) | ORAL | Status: DC
  Administered 2014-10-23 – 2014-10-24 (×3): 100 mg via ORAL

## 2014-10-23 MED ORDER — ONDANSETRON HCL 4 MG/2ML IJ SOLN
INTRAMUSCULAR | Status: DC | PRN
  Administered 2014-10-23: 4 mg via INTRAVENOUS

## 2014-10-23 MED ORDER — METHOCARBAMOL 1000 MG/10ML IJ SOLN
500.0000 mg | Freq: Four times a day (QID) | INTRAVENOUS | Status: DC | PRN
  Administered 2014-10-23: 500 mg via INTRAVENOUS
  Filled 2014-10-23 (×2): qty 5

## 2014-10-23 MED ORDER — HYDROMORPHONE HCL 1 MG/ML IJ SOLN
1.0000 mg | INTRAMUSCULAR | Status: DC | PRN
  Administered 2014-10-23 – 2014-10-24 (×7): 1 mg via INTRAVENOUS
  Filled 2014-10-23 (×6): qty 1

## 2014-10-23 MED ORDER — DEXAMETHASONE SODIUM PHOSPHATE 10 MG/ML IJ SOLN
INTRAMUSCULAR | Status: DC | PRN
  Administered 2014-10-23: 10 mg via INTRAVENOUS

## 2014-10-23 MED ORDER — METHOCARBAMOL 500 MG PO TABS
500.0000 mg | ORAL_TABLET | Freq: Four times a day (QID) | ORAL | Status: DC | PRN
  Administered 2014-10-23 – 2014-10-24 (×3): 500 mg via ORAL
  Filled 2014-10-23 (×3): qty 1

## 2014-10-23 MED ORDER — LIDOCAINE HCL (CARDIAC) 20 MG/ML IV SOLN
INTRAVENOUS | Status: AC
  Filled 2014-10-23: qty 5

## 2014-10-23 MED ORDER — ALUM & MAG HYDROXIDE-SIMETH 200-200-20 MG/5ML PO SUSP: 30.0000 mL | ORAL | Status: DC | PRN

## 2014-10-23 MED ORDER — POLYETHYLENE GLYCOL 3350 17 G PO PACK: 17.0000 g | PACK | Freq: Every day | ORAL | Status: DC | PRN

## 2014-10-23 MED ORDER — LACTATED RINGERS IV SOLN: INTRAVENOUS | Status: DC

## 2014-10-23 MED ORDER — SODIUM CHLORIDE 0.9 % IR SOLN
Status: DC | PRN
  Administered 2014-10-23: 3000 mL

## 2014-10-23 MED ORDER — ACETAMINOPHEN 650 MG RE SUPP: 650.0000 mg | Freq: Four times a day (QID) | RECTAL | Status: DC | PRN

## 2014-10-23 MED ORDER — ONDANSETRON HCL 4 MG PO TABS: 4.0000 mg | ORAL_TABLET | Freq: Four times a day (QID) | ORAL | Status: DC | PRN

## 2014-10-23 MED ORDER — PHENOL 1.4 % MT LIQD
1.0000 | OROMUCOSAL | Status: DC | PRN
  Filled 2014-10-23: qty 177

## 2014-10-23 MED ORDER — MENTHOL 3 MG MT LOZG
1.0000 | LOZENGE | OROMUCOSAL | Status: DC | PRN
  Filled 2014-10-23: qty 9

## 2014-10-23 MED ORDER — DEXAMETHASONE SODIUM PHOSPHATE 10 MG/ML IJ SOLN
INTRAMUSCULAR | Status: AC
Start: 2014-10-23 — End: 2014-10-23
  Filled 2014-10-23: qty 1

## 2014-10-23 MED ORDER — CEFAZOLIN SODIUM-DEXTROSE 2-3 GM-% IV SOLR
2.0000 g | Freq: Four times a day (QID) | INTRAVENOUS | Status: AC
  Administered 2014-10-23 (×2): 2 g via INTRAVENOUS
  Filled 2014-10-23 (×2): qty 50

## 2014-10-23 MED ORDER — OXYCODONE HCL 5 MG PO TABS
5.0000 mg | ORAL_TABLET | ORAL | Status: DC | PRN
  Administered 2014-10-23 (×2): 10 mg via ORAL
  Administered 2014-10-23 – 2014-10-24 (×8): 15 mg via ORAL
  Filled 2014-10-23 (×3): qty 3
  Filled 2014-10-23: qty 2
  Filled 2014-10-23 (×3): qty 3
  Filled 2014-10-23: qty 2
  Filled 2014-10-23 (×2): qty 3

## 2014-10-23 MED ORDER — MEPERIDINE HCL 50 MG/ML IJ SOLN: 6.2500 mg | INTRAMUSCULAR | Status: DC | PRN

## 2014-10-23 MED ORDER — MIDAZOLAM HCL 5 MG/5ML IJ SOLN
INTRAMUSCULAR | Status: DC | PRN
  Administered 2014-10-23: 2 mg via INTRAVENOUS

## 2014-10-23 MED ORDER — RIVAROXABAN 10 MG PO TABS
10.0000 mg | ORAL_TABLET | Freq: Every day | ORAL | Status: DC
  Administered 2014-10-24: 10 mg via ORAL
  Filled 2014-10-23 (×2): qty 1

## 2014-10-23 MED ORDER — DIPHENHYDRAMINE HCL 12.5 MG/5ML PO ELIX: 12.5000 mg | ORAL_SOLUTION | ORAL | Status: DC | PRN

## 2014-10-23 SURGICAL SUPPLY — 59 items
BAG ZIPLOCK 12X15 (MISCELLANEOUS) IMPLANT
BANDAGE ELASTIC 6 VELCRO ST LF (GAUZE/BANDAGES/DRESSINGS) ×2 IMPLANT
BANDAGE ESMARK 6X9 LF (GAUZE/BANDAGES/DRESSINGS) ×1 IMPLANT
BENZOIN TINCTURE PRP APPL 2/3 (GAUZE/BANDAGES/DRESSINGS) ×2 IMPLANT
BLADE SAG 13.0X1.37X90 (BLADE) IMPLANT
BLADE SAG 18X100X1.27 (BLADE) IMPLANT
BLADE SAGITTAL 25.0X1.37X90 (BLADE) IMPLANT
BNDG CONFORM 6X.82 1P STRL (GAUZE/BANDAGES/DRESSINGS) ×2 IMPLANT
BNDG ESMARK 6X9 LF (GAUZE/BANDAGES/DRESSINGS) ×2
BOWL SMART MIX CTS (DISPOSABLE) ×2 IMPLANT
CAPT KNEE TOTAL 3 ×2 IMPLANT
CEMENT BONE 1-PACK (Cement) ×4 IMPLANT
CUFF TOURN SGL QUICK 34 (TOURNIQUET CUFF) ×1
CUFF TRNQT CYL 34X4X40X1 (TOURNIQUET CUFF) ×1 IMPLANT
DRAPE EXTREMITY T 121X128X90 (DRAPE) ×2 IMPLANT
DRAPE POUCH INSTRU U-SHP 10X18 (DRAPES) ×2 IMPLANT
DRAPE SHEET LG 3/4 BI-LAMINATE (DRAPES) IMPLANT
DRAPE U-SHAPE 47X51 STRL (DRAPES) ×2 IMPLANT
DRSG PAD ABDOMINAL 8X10 ST (GAUZE/BANDAGES/DRESSINGS) ×2 IMPLANT
DURAPREP 26ML APPLICATOR (WOUND CARE) ×2 IMPLANT
ELECT REM PT RETURN 9FT ADLT (ELECTROSURGICAL) ×2
ELECTRODE REM PT RTRN 9FT ADLT (ELECTROSURGICAL) ×1 IMPLANT
FACESHIELD WRAPAROUND (MASK) ×10 IMPLANT
GAUZE SPONGE 4X4 12PLY STRL (GAUZE/BANDAGES/DRESSINGS) ×2 IMPLANT
GAUZE XEROFORM 1X8 LF (GAUZE/BANDAGES/DRESSINGS) IMPLANT
GLOVE BIO SURGEON STRL SZ7.5 (GLOVE) ×2 IMPLANT
GLOVE BIOGEL PI IND STRL 8 (GLOVE) ×2 IMPLANT
GLOVE BIOGEL PI INDICATOR 8 (GLOVE) ×2
GLOVE ECLIPSE 8.0 STRL XLNG CF (GLOVE) ×2 IMPLANT
GOWN STRL REUS W/TWL XL LVL3 (GOWN DISPOSABLE) ×4 IMPLANT
HANDPIECE INTERPULSE COAX TIP (DISPOSABLE) ×1
IMMOBILIZER KNEE 20 (SOFTGOODS) ×2
IMMOBILIZER KNEE 20 THIGH 36 (SOFTGOODS) ×1 IMPLANT
KIT BASIN OR (CUSTOM PROCEDURE TRAY) ×2 IMPLANT
NS IRRIG 1000ML POUR BTL (IV SOLUTION) ×2 IMPLANT
PACK TOTAL JOINT (CUSTOM PROCEDURE TRAY) ×2 IMPLANT
PAD ABD 8X10 STRL (GAUZE/BANDAGES/DRESSINGS) ×2 IMPLANT
PADDING CAST COTTON 6X4 STRL (CAST SUPPLIES) ×4 IMPLANT
POSITIONER SURGICAL ARM (MISCELLANEOUS) ×2 IMPLANT
SET HNDPC FAN SPRY TIP SCT (DISPOSABLE) ×1 IMPLANT
SET PAD KNEE POSITIONER (MISCELLANEOUS) ×2 IMPLANT
SPONGE LAP 18X18 X RAY DECT (DISPOSABLE) ×2 IMPLANT
STAPLER VISISTAT 35W (STAPLE) IMPLANT
STRIP CLOSURE SKIN 1/2X4 (GAUZE/BANDAGES/DRESSINGS) IMPLANT
SUCTION FRAZIER 12FR DISP (SUCTIONS) ×2 IMPLANT
SUT MNCRL AB 4-0 PS2 18 (SUTURE) ×2 IMPLANT
SUT VIC AB 0 CT1 27 (SUTURE) ×2
SUT VIC AB 0 CT1 27XBRD ANTBC (SUTURE) ×2 IMPLANT
SUT VIC AB 1 CT1 27 (SUTURE) ×2
SUT VIC AB 1 CT1 27XBRD ANTBC (SUTURE) ×2 IMPLANT
SUT VIC AB 2-0 CT1 27 (SUTURE) ×2
SUT VIC AB 2-0 CT1 TAPERPNT 27 (SUTURE) ×2 IMPLANT
TAPE STRIPS DRAPE STRL (GAUZE/BANDAGES/DRESSINGS) ×2 IMPLANT
TOWEL OR 17X26 10 PK STRL BLUE (TOWEL DISPOSABLE) ×2 IMPLANT
TOWEL OR NON WOVEN STRL DISP B (DISPOSABLE) IMPLANT
TRAY FOLEY CATH 14FRSI W/METER (CATHETERS) IMPLANT
TRAY FOLEY CATH 16FR SILVER (SET/KITS/TRAYS/PACK) ×2 IMPLANT
WATER STERILE IRR 1500ML POUR (IV SOLUTION) ×2 IMPLANT
WRAP KNEE MAXI GEL POST OP (GAUZE/BANDAGES/DRESSINGS) ×2 IMPLANT

## 2014-10-23 NOTE — Anesthesia Preprocedure Evaluation (Addendum)
Anesthesia Evaluation  Patient identified by MRN, date of birth, ID band Patient awake    Reviewed: Allergy & Precautions, H&P , NPO status , Patient's Chart, lab work & pertinent test results  Airway Mallampati: I  TM Distance: >3 FB Neck ROM: Full    Dental   Pulmonary neg pulmonary ROS,          Cardiovascular hypertension, Pt. on medications     Neuro/Psych Seizures -, Well Controlled,  negative psych ROS   GI/Hepatic Neg liver ROS, GERD-  Medicated and Controlled,  Endo/Other  negative endocrine ROS  Renal/GU negative Renal ROS  negative genitourinary   Musculoskeletal negative musculoskeletal ROS (+)   Abdominal   Peds negative pediatric ROS (+)  Hematology negative hematology ROS (+)   Anesthesia Other Findings   Reproductive/Obstetrics negative OB ROS                            Anesthesia Physical  Anesthesia Plan  ASA: II  Anesthesia Plan: Spinal   Post-op Pain Management: MAC Combined w/ Regional for Post-op pain   Induction: Intravenous  Airway Management Planned: Simple Face Mask  Additional Equipment:   Intra-op Plan:   Post-operative Plan:   Informed Consent: I have reviewed the patients History and Physical, chart, labs and discussed the procedure including the risks, benefits and alternatives for the proposed anesthesia with the patient or authorized representative who has indicated his/her understanding and acceptance.     Plan Discussed with: CRNA and Surgeon  Anesthesia Plan Comments:         Anesthesia Quick Evaluation

## 2014-10-23 NOTE — Progress Notes (Signed)
Utilization review completed.  

## 2014-10-23 NOTE — Transfer of Care (Signed)
Immediate Anesthesia Transfer of Care Note  Patient: [name]  Procedure(s) Performed: Procedure(s): LEFT TOTAL KNEE ARTHROPLASTY (Left)  Patient Location: PACU  Anesthesia Type:Spinal  Level of Consciousness: awake, alert  and oriented  Airway & Oxygen Therapy: Patient Spontanous Breathing and Patient connected to face mask oxygen  Post-op Assessment: Report given to PACU RN and Post -op Vital signs reviewed and stable  Post vital signs: Reviewed and stable  Complications: No apparent anesthesia complications

## 2014-10-23 NOTE — Evaluation (Signed)
Physical Therapy Evaluation Patient Details Name: [name] MRN: [id number] DOB: Jul 15, 1956 Today's Date: 10/23/2014   History of Present Illness  L TKA  Clinical Impression  **Pt admitted with above diagnosis. Pt currently with functional limitations due to the deficits listed below (see PT Problem List). ** Pt will benefit from skilled PT to increase their independence and safety with mobility to allow discharge to the venue listed below.    Pt ambulated 83' with RW, performed L TKA exercises with assist. Good progress expected.  *    Follow Up Recommendations Home health PT    Equipment Recommendations  None recommended by PT    Recommendations for Other Services       Precautions / Restrictions Precautions Precautions: Fall Restrictions Weight Bearing Restrictions: No      Mobility  Bed Mobility Overal bed mobility: Needs Assistance Bed Mobility: Supine to Sit     Supine to sit: Min assist     General bed mobility comments: cues for technique, min A to rise  Transfers Overall transfer level: Needs assistance Equipment used: Rolling walker (2 wheeled) Transfers: Sit to/from Stand Sit to Stand: Min assist         General transfer comment: min A to steady/rise  Ambulation/Gait Ambulation/Gait assistance: Min guard Ambulation Distance (Feet): 40 Feet Assistive device: Rolling walker (2 wheeled) Gait Pattern/deviations: Step-to pattern     General Gait Details: cues to lift head  Stairs            Wheelchair Mobility    Modified Rankin (Stroke Patients Only)       Balance                                             Pertinent Vitals/Pain Pain Assessment: 0-10 Pain Score: 8  Pain Descriptors / Indicators: Sore    Home Living Family/patient expects to be discharged to:: Private residence Living Arrangements: Spouse/significant other Available Help at Discharge: Family Type of Home: House Home Access: Stairs  to enter Entrance Stairs-Rails: Right Entrance Stairs-Number of Steps: 5 Home Layout: Able to live on main level with bedroom/bathroom Home Equipment: Walker - 2 wheels;Crutches;Bedside commode;Shower seat;Wheelchair - manual      Prior Function Level of Independence: Independent               Hand Dominance   Dominant Hand: Right    Extremity/Trunk Assessment   Upper Extremity Assessment: Overall WFL for tasks assessed           Lower Extremity Assessment: LLE deficits/detail   LLE Deficits / Details: AAROM flexion L knee 35* limited by pain, ankle WNL, hip 2/5     Communication   Communication: No difficulties  Cognition Arousal/Alertness: Awake/alert Behavior During Therapy: WFL for tasks assessed/performed Overall Cognitive Status: Within Functional Limits for tasks assessed                      General Comments      Exercises Total Joint Exercises Ankle Circles/Pumps: AROM;Both;10 reps Quad Sets: AROM;5 reps;Both Heel Slides: AAROM;Left;5 reps Goniometric ROM: L knee flexion AAROM 35* limited by pain      Assessment/Plan    PT Assessment Patient needs continued PT services  PT Diagnosis Difficulty walking;Acute pain   PT Problem List Decreased strength;Decreased range of motion;Decreased activity tolerance;Pain  PT Treatment Interventions DME instruction;Gait training;Stair training;Functional  mobility training;Therapeutic activities;Therapeutic exercise   PT Goals (Current goals can be found in the Care Plan section) Acute Rehab PT Goals Patient Stated Goal: to play golf, return to work as Dealer PT Goal Formulation: With patient/family Time For Goal Achievement: 11/06/14 Potential to Achieve Goals: Good    Frequency 7X/week   Barriers to discharge        Co-evaluation               End of Session Equipment Utilized During Treatment: Gait belt Activity Tolerance: Patient tolerated treatment well Patient left: in  chair;with call bell/phone within reach;with family/visitor present Nurse Communication: Mobility status         Time: 0600-4599 PT Time Calculation (min) (ACUTE ONLY): 25 min   Charges:   PT Evaluation $Initial PT Evaluation Tier I: 1 Procedure PT Treatments $Gait Training: 8-22 mins $Therapeutic Exercise: 8-22 mins   PT G Codes:          Philomena Doheny 10/23/2014, 2:55 PM [phone]

## 2014-10-23 NOTE — Anesthesia Postprocedure Evaluation (Signed)
  Anesthesia Post-op Note  Patient: [name]  Procedure(s) Performed: Procedure(s) (LRB): LEFT TOTAL KNEE ARTHROPLASTY (Left)  Patient Location: PACU  Anesthesia Type: Spinal  Level of Consciousness: awake and alert   Airway and Oxygen Therapy: Patient Spontanous Breathing  Post-op Pain: mild  Post-op Assessment: Post-op Vital signs reviewed, Patient's Cardiovascular Status Stable, Respiratory Function Stable, Patent Airway and No signs of Nausea or vomiting  Last Vitals:  Filed Vitals:   10/23/14 1305  BP: 140/105  Pulse:   Temp:   Resp:     Post-op Vital Signs: stable   Complications: No apparent anesthesia complications

## 2014-10-23 NOTE — Anesthesia Procedure Notes (Signed)
Spinal Patient location during procedure: OR Start time: 10/23/2014 7:25 AM End time: 10/23/2014 7:30 AM Staffing Anesthesiologist: Montez Hageman Resident/CRNA: Indiah Heyden, Nadara Mustard G Performed by: resident/CRNA  Preanesthetic Checklist Completed: patient identified, site marked, surgical consent, pre-op evaluation, timeout performed, IV checked, risks and benefits discussed and monitors and equipment checked Spinal Block Patient position: sitting Prep: Betadine Patient monitoring: heart rate, continuous pulse ox and blood pressure Approach: midline Location: L3-4 Injection technique: single-shot Needle Needle type: Sprotte  Needle gauge: 24 G Needle length: 10 cm Needle insertion depth: 9 cm Assessment Sensory level: T6

## 2014-10-23 NOTE — Op Note (Signed)
NAMECHEE, KINSLOW NO.:  [id number]  MEDICAL RECORD NO.:  [id number]  LOCATION:  [facility]                         FACILITY:  [facility]  PHYSICIAN:  Lind Guest. Ninfa Linden, M.D.DATE OF BIRTH:  02/23/1956  DATE OF PROCEDURE:  10/23/2014 DATE OF DISCHARGE:                              OPERATIVE REPORT   PREOPERATIVE DIAGNOSIS:  Primary osteoarthritis, degenerative joint disease, left knee.  POSTOPERATIVE DIAGNOSIS:  Primary osteoarthritis, degenerative joint disease, left knee.  PROCEDURE:  Left total knee arthroplasty.  IMPLANTS:  Stryker Triathlon knee with size 6 femur, size 7 tibial tray, 13 mm polyethylene insert, size 35 patella button.  SURGEON:  Lind Guest. Ninfa Linden, MD  ASSISTANT:  [name], PA-C  ANESTHESIA:  Spinal.  TOURNIQUET TIME:  One hour.  ANTIBIOTICS:  3 g IV Ancef.  BLOOD LOSS:  Less than 200 mL.  COMPLICATIONS:  None.  INDICATIONS:  Mr. [name] is a 58 year old gentleman, well known to me. He has known primary osteoarthritis of his left knee.  We performed already a right total hip arthroplasty and a right total knee arthroplasty.  His pain in his left knee has been going on for years now.  He has known varus deformity of that knee, periarticular osteophytes, significant sclerotic changes and joint space narrowing. He has tried hyaluronic acid and steroid injections as well as time, rest, ice, heat, anti-inflammatories, and quad strengthening exercises. He is a very active individual and now due to debilitating nature of his pain and the failure of conservative treatment, he does wish to proceed with the total knee arthroplasty.  He understands the risks and benefits of this including the risks of acute blood loss anemia, nerve and vessel injury, fracture, infection, DVT.  He understands the goals of decreased pain, improved mobility, and overall improved quality of life.  PROCEDURE DESCRIPTION:  After informed consent was  obtained, appropriate left knee was marked.  He was brought to the operating room and spinal anesthesia was obtained while he was on the stretcher.  He was then placed supine on the operating table.  A nonsterile tourniquet was placed on his upper left thigh and his left leg was prepped and draped with DuraPrep and sterile drapes.  Preoperatively, could see he had definitely had the varus deformity of his knee and a slight flexion contracture.  His knee was prepped and draped with DuraPrep and sterile drapes.  Time-out was called to identify correct patient, correct left knee.  We then used the Esmarch to wrap out the leg and tourniquet was inflated to 300 mm of pressure.  I made a direct midline incision over the patella and carried this proximally and distally.  I dissected down the knee joint and performed a medial parapatellar arthrotomy.  We found significant synovitis throughout the knee and large joint effusion.  I found periarticular osteophytes and significant sclerotic bone and loss of cartilage throughout the knee.  With the knee in a flexed position, we removed the remnants of the ACL, PCL, medial and lateral meniscus and osteophytes of the knee.  We then used the extramedullary guide on the tibia for our tibial cut.  We set this, took about 9 mm off  the high side, I corrected for varus and valgus and a negative slope.  We then made this cut easily.  Using the intramedullary guide through the notch of the femur, was set for distal femoral cut for a 10 mm distal femoral cut setting for 5 degrees externally rotated for the left knee.  We made this cut and then brought the knee back in extension and with 11 mm extension block, he had almost full extension, maybe a little bit of hyperextension.  I cleaned the remnants of the medial and lateral meniscus at that point, and cauterized the joint capsule.  Posteriorly, we are going back to the femur and used a femoral sizing guide based  off the epicondylar axis and Whitesides line.  It shows a size 6 femur based off of this, up with a 4 in 1 cutting block for a size 6 femur, made our anterior and posterior cuts followed by our chamfer cuts.  I then made my femoral box cut.  Attention was then turned back to the tibia.  We trialed for size 7 tibia and this actually corresponded to his other side as well.  I felt that base plate covered the tibial plateau well and we made our keel punch off this.  Next with the trial 7 tibia followed by the trial 6 femur, I placed a 13 mm polyethylene insert due to hyperextension and this brought him to full extension, I was pleased with stability.  We then made our patellar cut, taking about 9-10 mm off the patella and drilled holes for 35 patellar button.  With all trial components in place, I was pleased with the range of motion and stability.  I then removed all trial components.  We irrigated the knee with normal saline solution using pulsatile lavage.  We removed extra cement and cemented the real Stryker Triathlon tibial tray, size 7 and the real Stryker Triathlon femur size 6.  We placed the real 13 mm fix bearing polyethylene insert and cemented the patellar button.  Once the cement had hardened and dried, the tourniquet was let down and hemostasis was obtained with electrocautery.  We then closed the arthrotomy with interrupted #1 Vicryl suture followed by 0 Vicryl in the deep tissue, 2-0 Vicryl in subcutaneous tissue, 4-0 Monocryl subcuticular stitch, and Steri-Strips on the skin.  Well-padded sterile dressing was applied.  He was taken to recovery room in stable condition.  All final counts were correct.  There were no complications noted.  Of note, [name] PA-C assisted in the entire case, his assistance was crucial, facilitating all aspects of this case.     Lind Guest. Ninfa Linden, M.D.     CYB/MEDQ  D:  10/23/2014  T:  10/23/2014  Job:  [id number]

## 2014-10-23 NOTE — Brief Op Note (Signed)
10/23/2014  9:08 AM  PATIENT:  [name]  58 y.o. male  PRE-OPERATIVE DIAGNOSIS:  Osteoarthritis left knee  POST-OPERATIVE DIAGNOSIS:  Osteoarthritis left knee  PROCEDURE:  Procedure(s): LEFT TOTAL KNEE ARTHROPLASTY (Left)  SURGEON:  Surgeon(s) and Role:    * Mcarthur Rossetti, MD - Primary  PHYSICIAN ASSISTANT: Benita Stabile, PA-C  ANESTHESIA:   spinal  EBL:  Total I/O In: 1000 [I.V.:1000] Out: 325 [Urine:300; Blood:25]  BLOOD ADMINISTERED:none  DRAINS: none   LOCAL MEDICATIONS USED:  NONE  SPECIMEN:  No Specimen  DISPOSITION OF SPECIMEN:  N/A  COUNTS:  YES  TOURNIQUET:   Total Tourniquet Time Documented: Thigh (Left) - 62 minutes Total: Thigh (Left) - 62 minutes   DICTATION: .Other Dictation: Dictation Number [id number]  PLAN OF CARE: Admit to inpatient   PATIENT DISPOSITION:  PACU - hemodynamically stable.   Delay start of Pharmacological VTE agent (>24hrs) due to surgical blood loss or risk of bleeding: no

## 2014-10-23 NOTE — H&P (Signed)
TOTAL KNEE ADMISSION H&P  Patient is being admitted for left total knee arthroplasty.  Subjective:  Chief Complaint:left knee pain.  HPI: [name], 58 y.o. male, has a history of pain and functional disability in the left knee due to arthritis and has failed non-surgical conservative treatments for greater than 12 weeks to includeNSAID's and/or analgesics, corticosteriod injections, viscosupplementation injections, flexibility and strengthening excercises and activity modification.  Onset of symptoms was gradual, starting 3 years ago with gradually worsening course since that time. The patient noted no past surgery on the left knee(s).  Patient currently rates pain in the left knee(s) at 9 out of 10 with activity. Patient has night pain, worsening of pain with activity and weight bearing, pain that interferes with activities of daily living, pain with passive range of motion, crepitus and joint swelling.  Patient has evidence of subchondral sclerosis, periarticular osteophytes and joint space narrowing by imaging studies. There is no active infection.  Patient Active Problem List   Diagnosis Date Noted  . Arthritis of left knee 10/23/2014  . Arthritis of knee, right 01/19/2014  . Status post total knee replacement 01/19/2014   Past Medical History  Diagnosis Date  . Hypertension     takes Cardura and NOrvasc daily  . History of colon polyps   . Seizures     had one approx. 30 yrs ago,has not had any since  . Heart murmur     PT STATES HIS MEDICAL DOCTOR MONITORING HIS MURMUR - NOT CAUSING PT ANY PROBLEMS- STATES HE WAS NOT AWARE OF MURMUR UNTIL PHYSICAL 1014  . GERD (gastroesophageal reflux disease)     hx of, not freq  WOULD USE TUMS IF NEEDED  . Arthritis     knees--S/P RIGHT TOTAL KNEE REPLACED 01/19/14     Past Surgical History  Procedure Laterality Date  . Arthroscopy knee w/ drilling  bilateral    2012  . Ankle fusion  left    Dec. 2012  . Joint replacement      right  hip  01-2011  . Colonoscopy      x2  . Hardware removal  06/23/2012    Procedure: HARDWARE REMOVAL;  Surgeon: Wylene Simmer, MD;  Location: [facility];  Service: Orthopedics;  Laterality: Left;  Removal of Deep Implant  X's 3  . Foot arthrodesis  06/23/2012    Procedure: ARTHRODESIS FOOT;  Surgeon: Wylene Simmer, MD;  Location: [facility];  Service: Orthopedics;  Laterality: Left;  Left Subtalar and Talonavicular Joint Revision Arthrodesis  Aspiration of Bone Marrow from Left Hip   . Hair transplant    . Total knee arthroplasty Right 01/19/2014    Procedure: RIGHT TOTAL KNEE ARTHROPLASTY, Steroid injection left knee;  Surgeon: Mcarthur Rossetti, MD;  Location: [facility];  Service: Orthopedics;  Laterality: Right;    Prescriptions prior to admission  Medication Sig Dispense Refill Last Dose  . amLODipine (NORVASC) 10 MG tablet Take 10 mg by mouth every morning.     10/23/2014 at 0415  . doxazosin (CARDURA) 4 MG tablet Take 4 mg by mouth at bedtime.    10/22/2014 at pm  . HYDROcodone-acetaminophen (NORCO) 7.5-325 MG per tablet Take 1 tablet by mouth every 6 (six) hours as needed for moderate pain.   10/23/2014 at 0415  . ibuprofen (ADVIL,MOTRIN) 200 MG tablet Take 600 mg by mouth every 6 (six) hours as needed for mild pain or moderate pain.   Past Week at Unknown time  . lisinopril (PRINIVIL,ZESTRIL) 10  MG tablet Take 10 mg by mouth every morning.   10/22/2014 at am  . methocarbamol (ROBAXIN) 500 MG tablet Take 1 tablet (500 mg total) by mouth every 6 (six) hours as needed for muscle spasms. 60 tablet 0   . Multiple Vitamin (MULTIVITAMIN WITH MINERALS) TABS Take 1 tablet by mouth every morning.   10/22/2014 at Unknown time  . naproxen sodium (ANAPROX) 220 MG tablet Take 440 mg by mouth 2 (two) times daily with a meal.   10/22/2014 at am  . OVER THE COUNTER MEDICATION OVER THE COUNTER SLEEP AID IF NEEDED   Past Week at Unknown time  . OVER THE COUNTER MEDICATION ALKA SELTZER COLD - TOOK ONE TODAY 10/17/14 - FOR  COUGH, NASAL CONGESTION   Past Month at Unknown time  . aspirin EC 325 MG EC tablet Take 1 tablet (325 mg total) by mouth 2 (two) times daily after a meal. (Patient not taking: Reported on 10/15/2014) 30 tablet 0   . meclizine (ANTIVERT) 25 MG tablet Take 25 mg by mouth 3 (three) times daily as needed for dizziness.   More than a month at Unknown time  . oxyCODONE-acetaminophen (ROXICET) 5-325 MG per tablet Take 1-2 tablets by mouth every 4 (four) hours as needed for severe pain. (Patient not taking: Reported on 10/15/2014) 60 tablet 0    No Known Allergies  History  Substance Use Topics  . Smoking status: Never Smoker   . Smokeless tobacco: Never Used  . Alcohol Use: 0.0 oz/week     Comment: 1 drink daily    Family History  Problem Relation Age of Onset  . Colon cancer Father 56  . Stomach cancer Neg Hx      Review of Systems  Musculoskeletal: Positive for joint pain.  All other systems reviewed and are negative.   Objective:  Physical Exam  Constitutional: He is oriented to person, place, and time. He appears well-developed and well-nourished.  HENT:  Head: Normocephalic and atraumatic.  Eyes: EOM are normal. Pupils are equal, round, and reactive to light.  Neck: Normal range of motion. Neck supple.  Cardiovascular: Regular rhythm.   Respiratory: Effort normal and breath sounds normal.  GI: Soft. Bowel sounds are normal.  Musculoskeletal:       Left knee: He exhibits decreased range of motion, effusion and abnormal alignment. Tenderness found. Medial joint line and lateral joint line tenderness noted.  Neurological: He is alert and oriented to person, place, and time.  Skin: Skin is warm and dry.  Psychiatric: He has a normal mood and affect.    Vital signs in last 24 hours: Temp:  [98.2 F (36.8 C)] 98.2 F (36.8 C) (12/22 0542) Pulse Rate:  [66] 66 (12/22 0542) Resp:  [18] 18 (12/22 0542) BP: (151)/(89) 151/89 mmHg (12/22 0542) SpO2:  [96 %] 96 % (12/22  0542) Weight:  [120.657 kg (266 lb)] 120.657 kg (266 lb) (12/22 0552)  Labs:   Estimated body mass index is 33.25 kg/(m^2) as calculated from the following:   Height as of this encounter: 6\' 3"  (1.905 m).   Weight as of this encounter: 120.657 kg (266 lb).   Imaging Review Plain radiographs demonstrate severe degenerative joint disease of the left knee(s). The overall alignment ismild varus. The bone quality appears to be excellent for age and reported activity level.  Assessment/Plan:  End stage arthritis, left knee   The patient history, physical examination, clinical judgment of the provider and imaging studies are consistent with end stage  degenerative joint disease of the left knee(s) and total knee arthroplasty is deemed medically necessary. The treatment options including medical management, injection therapy arthroscopy and arthroplasty were discussed at length. The risks and benefits of total knee arthroplasty were presented and reviewed. The risks due to aseptic loosening, infection, stiffness, patella tracking problems, thromboembolic complications and other imponderables were discussed. The patient acknowledged the explanation, agreed to proceed with the plan and consent was signed. Patient is being admitted for inpatient treatment for surgery, pain control, PT, OT, prophylactic antibiotics, VTE prophylaxis, progressive ambulation and ADL's and discharge planning. The patient is planning to be discharged home with home health services

## 2014-10-24 LAB — CBC
HCT: 39.3 % (ref 39.0–52.0)
HEMOGLOBIN: 12.7 g/dL — AB (ref 13.0–17.0)
MCH: 30.4 pg (ref 26.0–34.0)
MCHC: 32.3 g/dL (ref 30.0–36.0)
MCV: 94 fL (ref 78.0–100.0)
Platelets: 202 10*3/uL (ref 150–400)
RBC: 4.18 MIL/uL — ABNORMAL LOW (ref 4.22–5.81)
RDW: 13.3 % (ref 11.5–15.5)
WBC: 14.2 10*3/uL — ABNORMAL HIGH (ref 4.0–10.5)

## 2014-10-24 LAB — BASIC METABOLIC PANEL
Anion gap: 5 (ref 5–15)
BUN: 17 mg/dL (ref 6–23)
CO2: 28 mmol/L (ref 19–32)
Calcium: 8.3 mg/dL — ABNORMAL LOW (ref 8.4–10.5)
Chloride: 101 mEq/L (ref 96–112)
Creatinine, Ser: 0.66 mg/dL (ref 0.50–1.35)
GFR calc Af Amer: >90 mL/min (ref >90)
GFR calc non Af Amer: >90 mL/min (ref >90)
GLUCOSE: 130 mg/dL — AB (ref 70–99)
POTASSIUM: 4 mmol/L (ref 3.5–5.1)
Sodium: 134 mmol/L — ABNORMAL LOW (ref 135–145)

## 2014-10-24 MED ORDER — LISINOPRIL 10 MG PO TABS
10.0000 mg | ORAL_TABLET | Freq: Every day | ORAL | Status: DC
  Administered 2014-10-24: 10 mg via ORAL
  Filled 2014-10-24: qty 1

## 2014-10-24 MED ORDER — OXYCODONE-ACETAMINOPHEN 5-325 MG PO TABS: 1.0000 | ORAL_TABLET | ORAL | Status: DC | PRN

## 2014-10-24 MED ORDER — METHOCARBAMOL 500 MG PO TABS: 500.0000 mg | ORAL_TABLET | Freq: Four times a day (QID) | ORAL | Status: DC | PRN

## 2014-10-24 MED ORDER — DSS 100 MG PO CAPS: 100.0000 mg | ORAL_CAPSULE | Freq: Two times a day (BID) | ORAL | Status: DC

## 2014-10-24 MED ORDER — RIVAROXABAN 10 MG PO TABS: 10.0000 mg | ORAL_TABLET | Freq: Every day | ORAL | Status: DC

## 2014-10-24 NOTE — Care Management Note (Signed)
    Page 1 of 2   10/24/2014     11:14:11 AM CARE MANAGEMENT NOTE 10/24/2014  Patient:  [name], [name]   Account Number:  [id number]  Date Initiated:  10/24/2014  Documentation initiated by:  [facility]  Subjective/Objective Assessment:   adm: LEFT TOTAL KNEE ARTHROPLASTY (Left)     Action/Plan:   discharge planning   Anticipated DC Date:  10/24/2014   Anticipated DC Plan:  [name]  CM consult      [facility] Choice  HOME HEALTH   Choice offered to / List presented to:  C-1 Patient   DME arranged  NA      DME agency  NA     [place] arranged  HH-2 PT      [place]   Status of service:  Completed, signed off Medicare Important Message given?   (If response is "NO", the following Medicare IM given date fields will be blank) Date Medicare IM given:   Medicare IM given by:   Date Additional Medicare IM given:   Additional Medicare IM given by:    Discharge Disposition:  [place]  Per UR Regulation:    If discussed at Long Length of Stay Meetings, dates discussed:    Comments:  10/24/14 08:15 Cm met with pt in room to offer choice of home health agency.  Pt chooses [facility] to render HHPT. address and contact information verified with pt with addition of wife [name], [phone].  No DME needed. referral called to [facility] rep, time with additional contact number.  No other CM needs were communicated. [name], BSN, [place].

## 2014-10-24 NOTE — Progress Notes (Signed)
Physical Therapy Treatment Patient Details Name: [name] MRN: [id number] DOB: 10/06/1956 Today's Date: 10/24/2014    History of Present Illness L TKA    PT Comments    POD # 1 pm session.  Applied KI and amb in hallway then practiced stairs with spouse.  Pt ready for D/C to home.  Follow Up Recommendations  Home health PT     Equipment Recommendations  None recommended by PT    Recommendations for Other Services       Precautions / Restrictions Precautions Precautions: Fall Precaution Comments: instructed to wear KI for stairs Required Braces or Orthoses: Knee Immobilizer - Left Restrictions Weight Bearing Restrictions: No    Mobility  Bed Mobility               General bed mobility comments: Pt OOB in recliner  Transfers Overall transfer level: Needs assistance Equipment used: Rolling walker (2 wheeled) Transfers: Sit to/from Stand Sit to Stand: Supervision;Min guard         General transfer comment: one VC safety with stand to sit  Ambulation/Gait Ambulation/Gait assistance: Supervision;Min guard Ambulation Distance (Feet): 185 Feet Assistive device: Rolling walker (2 wheeled) Gait Pattern/deviations: Step-to pattern Gait velocity: too quick, VC's to decreased speed   General Gait Details: tolerated increased distance and VC's to decrease gait speed for increased safety.    Stairs Stairs: Yes Stairs assistance: Min guard Stair Management: One rail Right;Step to pattern;Forwards;With crutches Number of Stairs: 4 General stair comments: with spouse present performed 4 steps with one R rail and one crutch and 50% VC's on proper tech and safety.  Wheelchair Mobility    Modified Rankin (Stroke Patients Only)       Balance                                    Cognition                            Exercises      General Comments        Pertinent Vitals/Pain Pain Assessment: 0-10 Pain Score: 6  Pain  Location: L knee Pain Descriptors / Indicators: Sore Pain Intervention(s): Monitored during session;Premedicated before session;Repositioned;Ewing applied    Home Living                      Prior Function            PT Goals (current goals can now be found in the care plan section) Progress towards PT goals: Progressing toward goals    Frequency  7X/week    PT Plan      Co-evaluation             End of Session Equipment Utilized During Treatment: Gait belt Activity Tolerance: Patient tolerated treatment well Patient left: in chair;with call bell/phone within reach;with family/visitor present     Time: 1410-1425 PT Time Calculation (min) (ACUTE ONLY): 15 min  Charges:  $Gait Training: 8-22 mins                    G Codes:      Rica Koyanagi  PTA [facility]  Acute  Rehab Pager      [phone]

## 2014-10-24 NOTE — Progress Notes (Signed)
OT Cancellation Note  Patient Details Name: HUMZAH HARTY MRN: [id number] DOB: [birth year]   Cancelled Treatment:    Reason Eval/Treat Not Completed: OT screened, no needs identified, will sign off  Darlina Rumpf [place], OTR/L [phone]  10/24/2014, 11:31 AM

## 2014-10-24 NOTE — Progress Notes (Signed)
Subjective: 1 Day Post-Op Procedure(s) (LRB): LEFT TOTAL KNEE ARTHROPLASTY (Left) Patient reports pain as mild. Wants to go home today.  Objective: Vital signs in last 24 hours: Temp:  [97.5 F (36.4 C)-98.8 F (37.1 C)] 98.5 F (36.9 C) (12/23 0635) Pulse Rate:  [50-80] 76 (12/23 0635) Resp:  [15-18] 16 (12/23 0635) BP: (115-156)/(72-140) 152/81 mmHg (12/23 0635) SpO2:  [96 %-100 %] 96 % (12/23 0635)  Intake/Output from previous day: 12/22 0701 - 12/23 0700 In: 3360 [I.V.:3200; IV Piggyback:160] Out: 3225 [Urine:3200; Blood:25] Intake/Output this shift:     Recent Labs  10/24/14 0443  HGB 12.7*    Recent Labs  10/24/14 0443  WBC 14.2*  RBC 4.18*  HCT 39.3  PLT 202    Recent Labs  10/24/14 0443  NA 134*  K 4.0  CL 101  CO2 28  BUN 17  CREATININE 0.66  GLUCOSE 130*  CALCIUM 8.3*   No results for input(s): LABPT, INR in the last 72 hours.  Left leg: Sensation intact distally Intact pulses distally Incision: dressing C/D/I Compartment soft  Assessment/Plan: 1 Day Post-Op Procedure(s) (LRB): LEFT TOTAL KNEE ARTHROPLASTY (Left) Up with therapy  Most likely will d/c home later today  [name] 10/24/2014, 7:48 AM

## 2014-10-24 NOTE — Progress Notes (Signed)
Physical Therapy Treatment Patient Details Name: [name] MRN: [id number] DOB: [birth year] Today's Date: 10/24/2014    History of Present Illness L TKA    PT Comments    POD # 1 am session.  Amb in hallway increased assistance.  Performed all supine TKR TE's followed by ICE.    Follow Up Recommendations  Home health PT     Equipment Recommendations  None recommended by PT    Recommendations for Other Services       Precautions / Restrictions Precautions Precautions: Fall Precaution Comments: instructed to wear KI for stairs Required Braces or Orthoses: Knee Immobilizer - Left Restrictions Weight Bearing Restrictions: No    Mobility  Bed Mobility               General bed mobility comments: Pt OOB in recliner  Transfers Overall transfer level: Needs assistance Equipment used: Rolling walker (2 wheeled) Transfers: Sit to/from Stand Sit to Stand: Supervision;Min guard         General transfer comment: one VC safety with stand to sit  Ambulation/Gait Ambulation/Gait assistance: Supervision;Min guard Ambulation Distance (Feet): 85 Feet Assistive device: Rolling walker (2 wheeled) Gait Pattern/deviations: Step-to pattern Gait velocity: too quick, VC's to decreased speed   General Gait Details: tolerated increased distance and VC's to decrease gait speed for increased safety.    Stairs            Wheelchair Mobility    Modified Rankin (Stroke Patients Only)       Balance                                    Cognition                            Exercises   Total Knee Replacement TE's 10 reps B LE ankle pumps 10 reps towel squeezes 10 reps knee presses 10 reps heel slides  10 reps SAQ's 10 reps SLR's 10 reps ABD Followed by ICE     General Comments        Pertinent Vitals/Pain Pain Assessment: 0-10 Pain Score: 6  Pain Location: L knee Pain Descriptors / Indicators: Sore Pain Intervention(s):  Monitored during session;Premedicated before session;Repositioned;Ice applied    Home Living                      Prior Function            PT Goals (current goals can now be found in the care plan section) Progress towards PT goals: Progressing toward goals    Frequency  7X/week    PT Plan      Co-evaluation             End of Session Equipment Utilized During Treatment: Gait belt Activity Tolerance: Patient tolerated treatment well Patient left: in chair;with call bell/phone within reach;with family/visitor present     Time: 6712-4580 PT Time Calculation (min) (ACUTE ONLY): 34 min  Charges:  $Gait Training: 8-22 mins $Therapeutic Exercise: 8-22 mins                    G Codes:      [name]  PTA [facility]  Acute  Rehab Pager      [phone]

## 2014-10-24 NOTE — Progress Notes (Signed)
Utilization review completed.  

## 2014-10-24 NOTE — Discharge Summary (Signed)
Patient ID: [name] MRN: [id number] DOB/AGE: [birth year] 58 y.o.  Admit date: 10/23/2014 Discharge date: 10/24/2014  Admission Diagnoses:  Principal Problem:   Arthritis of left knee Active Problems:   Status post total left knee replacement   Discharge Diagnoses:  Same  Past Medical History  Diagnosis Date  . Hypertension     takes Cardura and NOrvasc daily  . History of colon polyps   . Seizures     had one approx. 30 yrs ago,has not had any since  . Heart murmur     PT STATES HIS MEDICAL DOCTOR MONITORING HIS MURMUR - NOT CAUSING PT ANY PROBLEMS- STATES HE WAS NOT AWARE OF MURMUR UNTIL PHYSICAL 1014  . GERD (gastroesophageal reflux disease)     hx of, not freq  WOULD USE TUMS IF NEEDED  . Arthritis     knees--S/P RIGHT TOTAL KNEE REPLACED 01/19/14     Surgeries: Procedure(s): LEFT TOTAL KNEE ARTHROPLASTY on 10/23/2014   Consultants:    Discharged Condition: Improved  Hospital Course: IOKEPA GEFFRE is an 58 y.o. male who was admitted 10/23/2014 for operative treatment ofArthritis of left knee. Patient has severe unremitting pain that affects sleep, daily activities, and work/hobbies. After pre-op clearance the patient was taken to the operating room on 10/23/2014 and underwent  Procedure(s): LEFT TOTAL KNEE ARTHROPLASTY.    Patient was given perioperative antibiotics: Anti-infectives    Start     Dose/Rate Route Frequency Ordered Stop   10/23/14 1400  ceFAZolin (ANCEF) IVPB 2 g/50 mL premix     2 g100 mL/hr over 30 Minutes Intravenous Every 6 hours 10/23/14 1054 10/23/14 2004   10/23/14 0600  ceFAZolin (ANCEF) 3 g in dextrose 5 % 50 mL IVPB     3 g160 mL/hr over 30 Minutes Intravenous On call to O.R. 10/22/14 1453 10/23/14 0744       Patient was given sequential compression devices, early ambulation, and chemoprophylaxis to prevent DVT.  Patient benefited maximally from hospital stay and there were no complications.    Recent vital signs: Patient Vitals  for the past 24 hrs:  BP Temp Temp src Pulse Resp SpO2  10/24/14 0944 (!) 141/74 mmHg 98.4 F (36.9 C) Oral 72 16 98 %  10/24/14 0635 (!) 152/81 mmHg 98.5 F (36.9 C) Oral 76 16 96 %  10/24/14 0400 - - - - 15 98 %  10/24/14 0232 (!) 146/81 mmHg 98.6 F (37 C) Oral 80 16 98 %  10/24/14 0000 - - - - 15 98 %  10/23/14 2236 140/84 mmHg - - - - -  10/23/14 2211 140/84 mmHg 98.8 F (37.1 C) Oral 80 16 96 %  10/23/14 1947 - - - - 16 99 %  10/23/14 1450 125/77 mmHg - - - - -  10/23/14 1426 (!) 150/90 mmHg - - - - -  10/23/14 1305 (!) 140/105 mmHg - - - - -  10/23/14 1300 (!) 156/140 mmHg 98.3 F (36.8 C) Oral (!) 55 16 99 %     Recent laboratory studies:  Recent Labs  10/24/14 0443  WBC 14.2*  HGB 12.7*  HCT 39.3  PLT 202  NA 134*  K 4.0  CL 101  CO2 28  BUN 17  CREATININE 0.66  GLUCOSE 130*  CALCIUM 8.3*     Discharge Medications:     Medication List    STOP taking these medications        aspirin 325 MG EC tablet  ibuprofen 200 MG tablet  Commonly known as:  ADVIL,MOTRIN     naproxen sodium 220 MG tablet  Commonly known as:  ANAPROX      TAKE these medications        doxazosin 4 MG tablet  Commonly known as:  CARDURA  Take 4 mg by mouth at bedtime.     DSS 100 MG Caps  Take 100 mg by mouth 2 (two) times daily.     HYDROcodone-acetaminophen 7.5-325 MG per tablet  Commonly known as:  NORCO  Take 1 tablet by mouth every 6 (six) hours as needed for moderate pain.     lisinopril 10 MG tablet  Commonly known as:  PRINIVIL,ZESTRIL  Take 10 mg by mouth every morning.     meclizine 25 MG tablet  Commonly known as:  ANTIVERT  Take 25 mg by mouth 3 (three) times daily as needed for dizziness.     methocarbamol 500 MG tablet  Commonly known as:  ROBAXIN  Take 1 tablet (500 mg total) by mouth every 6 (six) hours as needed for muscle spasms.     multivitamin with minerals Tabs tablet  Take 1 tablet by mouth every morning.     NORVASC 10 MG tablet   Generic drug:  amLODipine  Take 10 mg by mouth every morning.     OVER THE COUNTER MEDICATION  OVER THE COUNTER SLEEP AID IF NEEDED     OVER THE COUNTER MEDICATION  ALKA SELTZER COLD - TOOK ONE TODAY 10/17/14 - FOR COUGH, NASAL CONGESTION     oxyCODONE-acetaminophen 5-325 MG per tablet  Commonly known as:  ROXICET  Take 1-2 tablets by mouth every 4 (four) hours as needed for severe pain.     rivaroxaban 10 MG Tabs tablet  Commonly known as:  XARELTO  Take 1 tablet (10 mg total) by mouth daily with breakfast.        Diagnostic Studies: Dg Knee Left Port  10/23/2014   CLINICAL DATA:  58 year old male status post knee replacement. Initial encounter.  EXAM: PORTABLE LEFT KNEE - 1-2 VIEW  COMPARISON:  None.  FINDINGS: Portable views of the left knee. Sequelae of total knee arthroplasty. Hardware components appear intact and normally aligned. Postoperative changes to the surrounding soft tissues including subcutaneous gas. No unexpected osseous changes.  IMPRESSION: Left total knee arthroplasty with no adverse features.   Electronically Signed   By: Lars Pinks M.D.   On: 10/23/2014 10:22    Disposition: 06-Home-Health Care Svc      Discharge Instructions    Discharge wound care:    Complete by:  As directed   Keep dressing clean dry and intact . May shower with dressing intact.     Elevate operative extremity    Complete by:  As directed   Encourage patient to wiggle toes often Elevate left leg     Weight bearing as tolerated    Complete by:  As directed   Laterality:  left  Extremity:  Lower           Follow-up Information    Follow up with [facility].   Why:  home health physical therapy   Contact information:   [place] [address] [phone]       Follow up with Mcarthur Rossetti, MD. Schedule an appointment as soon as possible for a visit in 2 weeks.   Specialty:  Orthopedic Surgery   Contact information:   [place]  [place] [address] [phone]        Signed: [name] 10/24/2014, 12:41 PM   Discharge

## 2014-10-24 NOTE — Discharge Instructions (Addendum)
Information on my medicine - XARELTO (Rivaroxaban)  This medication education was reviewed with me or my healthcare representative as part of my discharge preparation.  The pharmacist that spoke with me during my hospital stay was:  [name], [facility]  Why was Xarelto prescribed for you? Xarelto was prescribed for you to reduce the risk of blood clots forming after orthopedic surgery. The medical term for these abnormal blood clots is venous thromboembolism (VTE).  What do you need to know about xarelto ? Take your Xarelto ONCE DAILY at the same time every day. You may take it either with or without food.  If you have difficulty swallowing the tablet whole, you may crush it and mix in applesauce just prior to taking your dose.  Take Xarelto exactly as prescribed by your doctor and DO NOT stop taking Xarelto without talking to the doctor who prescribed the medication.  Stopping without other VTE prevention medication to take the place of Xarelto may increase your risk of developing a clot.  After discharge, you should have regular check-up appointments with your healthcare provider that is prescribing your Xarelto.    What do you do if you miss a dose? If you miss a dose, take it as soon as you remember on the same day then continue your regularly scheduled once daily regimen the next day. Do not take two doses of Xarelto on the same day.   Important Safety Information A possible side effect of Xarelto is bleeding. You should call your healthcare provider right away if you experience any of the following: ? Bleeding from an injury or your nose that does not stop. ? Unusual colored urine (red or dark brown) or unusual colored stools (red or black). ? Unusual bruising for unknown reasons. ? A serious fall or if you hit your head (even if there is no bleeding).  Some medicines may interact with Xarelto and might increase your risk of bleeding while on Xarelto. To help  avoid this, consult your healthcare provider or pharmacist prior to using any new prescription or non-prescription medications, including herbals, vitamins, non-steroidal anti-inflammatory drugs (NSAIDs) and supplements.  Avoid Aleve while taking Xarelto.  This website has more information on Xarelto: [email or web address]-benson.[email or web address]   Pickup stool softener for constipation. Left lower extremity Weight Bearing as tolerated  Progress activities slowly Expect left knee soreness and swelling. Apply heat or ice as needed. Keep left hip dressing clean dry and intact, may shower with dressing intact.

## 2014-11-16 DIAGNOSIS — Z0000 Encounter for general adult medical examination without abnormal findings: Secondary | ICD-10-CM | POA: Insufficient documentation

## 2016-01-09 ENCOUNTER — Telehealth: Payer: Self-pay | Admitting: Cardiovascular Disease

## 2016-01-09 NOTE — Telephone Encounter (Signed)
Received records from [place] for appointment on 02/07/16 with Dr Gwenlyn Found.  Records given to [facility] (medical records) for Dr Kennon Holter schedule on 02/07/16. lp

## 2016-02-07 ENCOUNTER — Encounter: Payer: Self-pay | Admitting: Cardiovascular Disease

## 2016-02-07 ENCOUNTER — Ambulatory Visit (INDEPENDENT_AMBULATORY_CARE_PROVIDER_SITE_OTHER): Payer: BLUE CROSS/BLUE SHIELD | Admitting: Cardiovascular Disease

## 2016-02-07 VITALS — BP 140/90 | HR 63 | Ht 74.0 in | Wt 260.0 lb

## 2016-02-07 DIAGNOSIS — I340 Nonrheumatic mitral (valve) insufficiency: Secondary | ICD-10-CM | POA: Insufficient documentation

## 2016-02-07 DIAGNOSIS — I10 Essential (primary) hypertension: Secondary | ICD-10-CM | POA: Diagnosis not present

## 2016-02-07 DIAGNOSIS — I059 Rheumatic mitral valve disease, unspecified: Secondary | ICD-10-CM | POA: Diagnosis not present

## 2016-02-07 NOTE — Assessment & Plan Note (Signed)
History of hypertension blood pressure measured at 140/90. He is on amlodipine, doxazosin and lisinopril is aware of salt restriction. At this point we'll continue current medical therapy.we could potentially increase his lisinopril to twice a day if necessary.

## 2016-02-07 NOTE — Patient Instructions (Signed)
Medication Instructions:  Your physician recommends that you continue on your current medications as directed. Please refer to the Current Medication list given to you today.   Labwork: I will get your lab work from your Primary Care Physician.   Testing/Procedures: Your physician has requested that you have an echocardiogram. Echocardiography is a painless test that uses sound waves to create images of your heart. It provides your doctor with information about the size and shape of your heart and how well your heart's chambers and valves are working. This procedure takes approximately one hour. There are no restrictions for this procedure.    Follow-Up: Your physician wants you to follow-up in: [place]. You will receive a reminder letter in the mail two months in advance. If you don't receive a letter, please call our office to schedule the follow-up appointment.   Any Other Special Instructions Will Be Listed Below (If Applicable).     If you need a refill on your cardiac medications before your next appointment, please call your pharmacy.

## 2016-02-07 NOTE — Assessment & Plan Note (Signed)
History of moderate to severe mitral regurgitation by 2-D echocardiogram last performed 11/25/12. He is a mildly dilated LV with normal LV systolic function. His end-diastolic dimension was 60 mm. He did have severe left atrium enlargement. He is currently asymptomatic. We'll continue to follow him by annual 2-D echocardiograms.

## 2016-02-07 NOTE — Progress Notes (Signed)
02/07/2016 GURKIRAT CROFTS   November 03, 1955  [id number]  Primary Physician Phineas Inches, MD Primary Cardiologist: Lorretta Harp MD Renae Gloss   HPI:  Mr. 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 He was referred by Dr. Coletta Memos for cardiovascular evaluation because of hypertension and mitral regurgitation. His only cardiac risk factor is treated hypertension. He does not smoke. He's never had a heart attack or stroke. He is on 3 antihypertensive medications and is aware of some restriction. His last 2-D echocardiogram performed 11/25/12 revealed mildly dilated left ventricle with normal systolic function, moderate to severe MR and severe left atrial enlargement. He remains in sinus rhythm and otherwise is asymptomatic.   Current Outpatient Prescriptions  Medication Sig Dispense Refill  . amLODipine (NORVASC) 10 MG tablet Take 10 mg by mouth every morning.      Marland Kitchen doxazosin (CARDURA) 4 MG tablet Take 4 mg by mouth at bedtime.     Marland Kitchen lisinopril (PRINIVIL,ZESTRIL) 10 MG tablet Take 20 mg by mouth every morning.     . meclizine (ANTIVERT) 25 MG tablet Take 25 mg by mouth 3 (three) times daily as needed for dizziness.    . Multiple Vitamin (MULTIVITAMIN WITH MINERALS) TABS Take 1 tablet by mouth every morning.    Marland Kitchen OVER THE COUNTER MEDICATION OVER THE COUNTER SLEEP AID IF NEEDED    . OVER THE COUNTER MEDICATION ALKA SELTZER COLD - TOOK ONE TODAY 10/17/14 - FOR COUGH, NASAL CONGESTION     No current facility-administered medications for this visit.    No Known Allergies  Social History   Social History  . Marital Status: Married    Spouse Name: N/A  . Number of Children: N/A  . Years of Education: N/A   Occupational History  . Not on file.   Social History Main Topics  . Smoking status: Never Smoker   . Smokeless tobacco: Never Used  . Alcohol Use: 0.0 oz/week   Comment: 1 drink daily  . Drug Use: No  . Sexual Activity: Yes   Other Topics Concern  . Not on file   Social History Narrative     Review of Systems: General: negative for chills, fever, night sweats or weight changes.  Cardiovascular: negative for chest pain, dyspnea on exertion, edema, orthopnea, palpitations, paroxysmal nocturnal dyspnea or shortness of breath Dermatological: negative for rash Respiratory: negative for cough or wheezing Urologic: negative for hematuria Abdominal: negative for nausea, vomiting, diarrhea, bright red blood per rectum, melena, or hematemesis Neurologic: negative for visual changes, syncope, or dizziness All other systems reviewed and are otherwise negative except as noted above.    Blood pressure 140/90, pulse 63, height 6\' 2"  (1.88 m), weight 260 lb (117.935 kg).  General appearance: alert and no distress Neck: no adenopathy, no carotid bruit, no JVD, supple, symmetrical, trachea midline and thyroid not enlarged, symmetric, no tenderness/mass/nodules Lungs: clear to auscultation bilaterally Heart: soft apical systolic murmur consistent with mitral regurgitation Extremities: extremities normal, atraumatic, no cyanosis or edema  EKG sinus rhythm at 63 with right bundle branch block and left axis deviation. I personally reviewed this EKG  ASSESSMENT AND PLAN:   Essential hypertension History of hypertension blood pressure measured at 140/90. He is on amlodipine, doxazosin and lisinopril is aware of salt restriction. At this point we'll continue current medical therapy.we could potentially increase his lisinopril to twice a day if necessary.  Mitral regurgitation  History of moderate to severe mitral regurgitation by 2-D echocardiogram last performed 11/25/12. He is a mildly dilated LV with normal LV systolic function. His end-diastolic dimension was 60 mm. He did have severe left atrium enlargement. He is currently asymptomatic. We'll continue to  follow him by annual 2-D echocardiograms.      Lorretta Harp MD FACP,FACC,FAHA, [facility] 02/07/2016 4:14 PM

## 2016-03-06 ENCOUNTER — Ambulatory Visit (HOSPITAL_COMMUNITY): Payer: BLUE CROSS/BLUE SHIELD | Attending: Internal Medicine

## 2016-03-06 ENCOUNTER — Other Ambulatory Visit: Payer: Self-pay

## 2016-03-06 DIAGNOSIS — I071 Rheumatic tricuspid insufficiency: Secondary | ICD-10-CM | POA: Diagnosis not present

## 2016-03-06 DIAGNOSIS — I059 Rheumatic mitral valve disease, unspecified: Secondary | ICD-10-CM | POA: Insufficient documentation

## 2016-03-06 DIAGNOSIS — I340 Nonrheumatic mitral (valve) insufficiency: Secondary | ICD-10-CM | POA: Insufficient documentation

## 2016-03-06 DIAGNOSIS — I341 Nonrheumatic mitral (valve) prolapse: Secondary | ICD-10-CM | POA: Diagnosis not present

## 2016-03-06 DIAGNOSIS — I517 Cardiomegaly: Secondary | ICD-10-CM | POA: Insufficient documentation

## 2016-03-25 ENCOUNTER — Other Ambulatory Visit: Payer: Self-pay | Admitting: *Deleted

## 2016-03-25 DIAGNOSIS — I340 Nonrheumatic mitral (valve) insufficiency: Secondary | ICD-10-CM

## 2016-03-25 DIAGNOSIS — I10 Essential (primary) hypertension: Secondary | ICD-10-CM

## 2016-06-18 ENCOUNTER — Other Ambulatory Visit: Payer: Self-pay | Admitting: Surgery

## 2016-07-03 ENCOUNTER — Encounter (HOSPITAL_BASED_OUTPATIENT_CLINIC_OR_DEPARTMENT_OTHER): Payer: Self-pay | Admitting: *Deleted

## 2016-07-03 NOTE — Progress Notes (Signed)
   07/03/16 1012  OBSTRUCTIVE SLEEP APNEA  Have you ever been diagnosed with sleep apnea through a sleep study? No  Do you snore loudly (loud enough to be heard through closed doors)?  1  Do you often feel tired, fatigued, or sleepy during the daytime (such as falling asleep during driving or talking to someone)? 0  Has anyone observed you stop breathing during your sleep? 0  Do you have, or are you being treated for high blood pressure? 1  BMI more than 35 kg/m2? 0  Age > 50 (1-yes) 1  Neck circumference greater than:Male 16 inches or larger, Male 17inches or larger? 0  Male Gender (Yes=1) 1  Obstructive Sleep Apnea Score 4

## 2016-07-03 NOTE — Progress Notes (Signed)
Dr Al Corpus reviewed chart, ok for [facility].

## 2016-07-10 ENCOUNTER — Ambulatory Visit (HOSPITAL_BASED_OUTPATIENT_CLINIC_OR_DEPARTMENT_OTHER): Payer: BLUE CROSS/BLUE SHIELD | Admitting: Certified Registered"

## 2016-07-10 ENCOUNTER — Encounter (HOSPITAL_BASED_OUTPATIENT_CLINIC_OR_DEPARTMENT_OTHER): Payer: Self-pay | Admitting: Certified Registered"

## 2016-07-10 ENCOUNTER — Ambulatory Visit (HOSPITAL_BASED_OUTPATIENT_CLINIC_OR_DEPARTMENT_OTHER)
Admission: RE | Admit: 2016-07-10 | Discharge: 2016-07-10 | Disposition: A | Discharge status: Home / Self Care | Payer: BLUE CROSS/BLUE SHIELD | Source: Ambulatory Visit | Attending: Surgery | Admitting: Surgery

## 2016-07-10 ENCOUNTER — Encounter (HOSPITAL_BASED_OUTPATIENT_CLINIC_OR_DEPARTMENT_OTHER)
Admission: RE | Disposition: A | Discharge status: Home / Self Care | Payer: Self-pay | Source: Ambulatory Visit | Attending: Surgery

## 2016-07-10 DIAGNOSIS — K429 Umbilical hernia without obstruction or gangrene: Secondary | ICD-10-CM | POA: Insufficient documentation

## 2016-07-10 DIAGNOSIS — I10 Essential (primary) hypertension: Secondary | ICD-10-CM | POA: Diagnosis not present

## 2016-07-10 DIAGNOSIS — F419 Anxiety disorder, unspecified: Secondary | ICD-10-CM | POA: Diagnosis not present

## 2016-07-10 DIAGNOSIS — M1990 Unspecified osteoarthritis, unspecified site: Secondary | ICD-10-CM | POA: Insufficient documentation

## 2016-07-10 DIAGNOSIS — K219 Gastro-esophageal reflux disease without esophagitis: Secondary | ICD-10-CM | POA: Diagnosis not present

## 2016-07-10 HISTORY — PX: UMBILICAL HERNIA REPAIR: SHX196

## 2016-07-10 HISTORY — PX: INSERTION OF MESH: SHX5868

## 2016-07-10 HISTORY — DX: Anxiety disorder, unspecified: F41.9

## 2016-07-10 SURGERY — REPAIR, HERNIA, UMBILICAL, ADULT
Anesthesia: General | Site: Abdomen

## 2016-07-10 MED ORDER — EPHEDRINE 5 MG/ML INJ
INTRAVENOUS | Status: AC
  Filled 2016-07-10: qty 10

## 2016-07-10 MED ORDER — MIDAZOLAM HCL 2 MG/2ML IJ SOLN
1.0000 mg | INTRAMUSCULAR | Status: DC | PRN
  Administered 2016-07-10: 2 mg via INTRAVENOUS

## 2016-07-10 MED ORDER — HYDROMORPHONE HCL 1 MG/ML IJ SOLN
INTRAMUSCULAR | Status: AC
  Filled 2016-07-10: qty 1

## 2016-07-10 MED ORDER — CEFAZOLIN SODIUM-DEXTROSE 2-4 GM/100ML-% IV SOLN
2.0000 g | INTRAVENOUS | Status: AC
  Administered 2016-07-10: 2 g via INTRAVENOUS

## 2016-07-10 MED ORDER — SCOPOLAMINE 1 MG/3DAYS TD PT72: 1.0000 | MEDICATED_PATCH | Freq: Once | TRANSDERMAL | Status: DC | PRN

## 2016-07-10 MED ORDER — OXYCODONE HCL 5 MG PO TABS
5.0000 mg | ORAL_TABLET | Freq: Once | ORAL | Status: AC
  Administered 2016-07-10: 5 mg via ORAL

## 2016-07-10 MED ORDER — CHLORHEXIDINE GLUCONATE CLOTH 2 % EX PADS
6.0000 | MEDICATED_PAD | Freq: Once | CUTANEOUS | Status: DC
Start: 2016-07-10 — End: 2016-07-10

## 2016-07-10 MED ORDER — CHLORHEXIDINE GLUCONATE CLOTH 2 % EX PADS: 6.0000 | MEDICATED_PAD | Freq: Once | CUTANEOUS | Status: DC

## 2016-07-10 MED ORDER — FENTANYL CITRATE (PF) 100 MCG/2ML IJ SOLN
50.0000 ug | INTRAMUSCULAR | Status: AC | PRN
  Administered 2016-07-10 (×2): 50 ug via INTRAVENOUS
  Administered 2016-07-10: 100 ug via INTRAVENOUS

## 2016-07-10 MED ORDER — KETOROLAC TROMETHAMINE 30 MG/ML IJ SOLN
INTRAMUSCULAR | Status: DC | PRN
  Administered 2016-07-10: 30 mg via INTRAVENOUS

## 2016-07-10 MED ORDER — OXYCODONE HCL 5 MG PO TABS
ORAL_TABLET | ORAL | Status: AC
  Filled 2016-07-10: qty 1

## 2016-07-10 MED ORDER — HYDROMORPHONE HCL 1 MG/ML IJ SOLN: 0.5000 mg | Freq: Once | INTRAMUSCULAR | Status: DC

## 2016-07-10 MED ORDER — DEXAMETHASONE SODIUM PHOSPHATE 4 MG/ML IJ SOLN
INTRAMUSCULAR | Status: DC | PRN
  Administered 2016-07-10: 10 mg via INTRAVENOUS

## 2016-07-10 MED ORDER — LIDOCAINE HCL (CARDIAC) 20 MG/ML IV SOLN
INTRAVENOUS | Status: DC | PRN
  Administered 2016-07-10: 60 mg via INTRAVENOUS

## 2016-07-10 MED ORDER — FENTANYL CITRATE (PF) 100 MCG/2ML IJ SOLN
INTRAMUSCULAR | Status: AC
  Filled 2016-07-10: qty 2

## 2016-07-10 MED ORDER — CEFAZOLIN SODIUM-DEXTROSE 2-4 GM/100ML-% IV SOLN
INTRAVENOUS | Status: AC
Start: 2016-07-10 — End: 2016-07-10
  Filled 2016-07-10: qty 100

## 2016-07-10 MED ORDER — HYDROMORPHONE HCL 1 MG/ML IJ SOLN
0.2500 mg | INTRAMUSCULAR | Status: DC | PRN
  Administered 2016-07-10 (×4): 0.5 mg via INTRAVENOUS

## 2016-07-10 MED ORDER — BUPIVACAINE-EPINEPHRINE 0.5% -1:200000 IJ SOLN
INTRAMUSCULAR | Status: DC | PRN
Start: 2016-07-10 — End: 2016-07-10
  Administered 2016-07-10: 20 mL

## 2016-07-10 MED ORDER — BUPIVACAINE-EPINEPHRINE (PF) 0.5% -1:200000 IJ SOLN
INTRAMUSCULAR | Status: AC
  Filled 2016-07-10: qty 30

## 2016-07-10 MED ORDER — GLYCOPYRROLATE 0.2 MG/ML IJ SOLN: 0.2000 mg | Freq: Once | INTRAMUSCULAR | Status: DC | PRN

## 2016-07-10 MED ORDER — MIDAZOLAM HCL 2 MG/2ML IJ SOLN
INTRAMUSCULAR | Status: AC
Start: 2016-07-10 — End: 2016-07-10
  Filled 2016-07-10: qty 2

## 2016-07-10 MED ORDER — ONDANSETRON HCL 4 MG/2ML IJ SOLN
INTRAMUSCULAR | Status: DC | PRN
  Administered 2016-07-10: 4 mg via INTRAVENOUS

## 2016-07-10 MED ORDER — HYDROCODONE-ACETAMINOPHEN 5-325 MG PO TABS: 1.0000 | ORAL_TABLET | ORAL | 0 refills | Status: DC | PRN

## 2016-07-10 MED ORDER — PROPOFOL 10 MG/ML IV BOLUS
INTRAVENOUS | Status: DC | PRN
  Administered 2016-07-10: 200 mg via INTRAVENOUS

## 2016-07-10 MED ORDER — LACTATED RINGERS IV SOLN
INTRAVENOUS | Status: DC
  Administered 2016-07-10 (×2): via INTRAVENOUS

## 2016-07-10 SURGICAL SUPPLY — 42 items
BLADE CLIPPER SURG (BLADE) ×2 IMPLANT
BLADE HEX COATED 2.75 (ELECTRODE) ×2 IMPLANT
BLADE SURG 15 STRL LF DISP TIS (BLADE) ×1 IMPLANT
BLADE SURG 15 STRL SS (BLADE) ×1
CANISTER SUCT 1200ML W/VALVE (MISCELLANEOUS) IMPLANT
CHLORAPREP W/TINT 26ML (MISCELLANEOUS) ×2 IMPLANT
COVER BACK TABLE 60X90IN (DRAPES) ×2 IMPLANT
COVER MAYO STAND STRL (DRAPES) ×2 IMPLANT
DECANTER SPIKE VIAL GLASS SM (MISCELLANEOUS) IMPLANT
DRAPE LAPAROTOMY 100X72 PEDS (DRAPES) ×2 IMPLANT
DRAPE UTILITY XL STRL (DRAPES) ×2 IMPLANT
DRSG TEGADERM 2-3/8X2-3/4 SM (GAUZE/BANDAGES/DRESSINGS) IMPLANT
ELECT REM PT RETURN 9FT ADLT (ELECTROSURGICAL) ×2
ELECTRODE REM PT RTRN 9FT ADLT (ELECTROSURGICAL) ×1 IMPLANT
GLOVE BIOGEL PI IND STRL 7.0 (GLOVE) ×2 IMPLANT
GLOVE BIOGEL PI INDICATOR 7.0 (GLOVE) ×2
GLOVE ECLIPSE 6.5 STRL STRAW (GLOVE) ×2 IMPLANT
GLOVE SURG SIGNA 7.5 PF LTX (GLOVE) ×2 IMPLANT
GOWN STRL REUS W/ TWL LRG LVL3 (GOWN DISPOSABLE) ×1 IMPLANT
GOWN STRL REUS W/ TWL XL LVL3 (GOWN DISPOSABLE) ×1 IMPLANT
GOWN STRL REUS W/TWL LRG LVL3 (GOWN DISPOSABLE) ×1
GOWN STRL REUS W/TWL XL LVL3 (GOWN DISPOSABLE) ×1
LIQUID BAND (GAUZE/BANDAGES/DRESSINGS) ×2 IMPLANT
MESH VENTRALEX ST 1-7/10 CRC S (Mesh General) ×2 IMPLANT
NEEDLE HYPO 25X1 1.5 SAFETY (NEEDLE) ×2 IMPLANT
NS IRRIG 1000ML POUR BTL (IV SOLUTION) IMPLANT
PACK BASIN DAY SURGERY FS (CUSTOM PROCEDURE TRAY) ×2 IMPLANT
PENCIL BUTTON HOLSTER BLD 10FT (ELECTRODE) ×2 IMPLANT
SLEEVE SCD COMPRESS KNEE MED (MISCELLANEOUS) ×2 IMPLANT
SPONGE LAP 4X18 X RAY DECT (DISPOSABLE) ×2 IMPLANT
SUT MNCRL AB 4-0 PS2 18 (SUTURE) ×2 IMPLANT
SUT NOVA 0 T19/GS 22DT (SUTURE) IMPLANT
SUT NOVA NAB DX-16 0-1 5-0 T12 (SUTURE) IMPLANT
SUT VIC AB 2-0 SH 27 (SUTURE)
SUT VIC AB 2-0 SH 27XBRD (SUTURE) IMPLANT
SUT VIC AB 3-0 SH 27 (SUTURE) ×1
SUT VIC AB 3-0 SH 27X BRD (SUTURE) ×1 IMPLANT
SYR CONTROL 10ML LL (SYRINGE) ×2 IMPLANT
TOWEL OR 17X24 6PK STRL BLUE (TOWEL DISPOSABLE) ×2 IMPLANT
TOWEL OR NON WOVEN STRL DISP B (DISPOSABLE) IMPLANT
TUBE CONNECTING 20X1/4 (TUBING) IMPLANT
YANKAUER SUCT BULB TIP NO VENT (SUCTIONS) IMPLANT

## 2016-07-10 NOTE — Discharge Instructions (Signed)
[facility] _______[facility]  UMBILICAL OR INGUINAL HERNIA REPAIR: POST OP INSTRUCTIONS  Always review your discharge instruction sheet given to you by the facility where your surgery was performed. IF YOU HAVE DISABILITY OR FAMILY LEAVE FORMS, YOU MUST BRING THEM TO THE OFFICE FOR PROCESSING.   DO NOT GIVE THEM TO YOUR DOCTOR.  1. A  prescription for pain medication may be given to you upon discharge.  Take your pain medication as prescribed, if needed.  If narcotic pain medicine is not needed, then you may take acetaminophen (Tylenol) or ibuprofen (Advil) as needed. 2. Take your usually prescribed medications unless otherwise directed. 3. If you need a refill on your pain medication, please contact your pharmacy.  They will contact our office to request authorization. Prescriptions will not be filled after 5 pm or on week-ends. 4. You should follow a light diet the first 24 hours after arrival home, such as soup and crackers, etc.  Be sure to include lots of fluids daily.  Resume your normal diet the day after surgery. 5. Most patients will experience some swelling and bruising around the umbilicus or in the groin and scrotum.  Ice packs and reclining will help.  Swelling and bruising can take several days to resolve.  6. It is common to experience some constipation if taking pain medication after surgery.  Increasing fluid intake and taking a stool softener (such as Colace) will usually help or prevent this problem from occurring.  A mild laxative (Milk of Magnesia or Miralax) should be taken according to package directions if there are no bowel movements after 48 hours. 7. Unless discharge instructions indicate otherwise, you may remove your bandages 24-48 hours after surgery, and you may shower at that time.  You may have steri-strips (small skin tapes) in place directly over the incision.  These strips should be left on the skin for 7-10 days.  If your surgeon used skin glue on the  incision, you may shower in 24 hours.  The glue will flake off over the next 2-3 weeks.  Any sutures or staples will be removed at the office during your follow-up visit. 8. ACTIVITIES:  You may resume regular (light) daily activities beginning the next day--such as daily self-care, walking, climbing stairs--gradually increasing activities as tolerated.  You may have sexual intercourse when it is comfortable.  Refrain from any heavy lifting or straining until approved by your doctor. a. You may drive when you are no longer taking prescription pain medication, you can comfortably wear a seatbelt, and you can safely maneuver your car and apply brakes. b. RETURN TO WORK:  __________________________________________________________ 9. You should see your doctor in the office for a follow-up appointment approximately 2-3 weeks after your surgery.  Make sure that you call for this appointment within a day or two after you arrive home to insure a convenient appointment time. 10. OTHER INSTRUCTIONS: NO LIFTING MORE THAN 15 POUNDS FOR 4 WEEKS 11. OK TO SHOWER 12. ICE PACK AND IBUPROFEN ALSO FOR PAIN __________________________________________________________________________________________________________________________________________________________________________________________  WHEN TO CALL YOUR DOCTOR: 1. Fever over 101.0 2. Inability to urinate 3. Nausea and/or vomiting 4. Extreme swelling or bruising 5. Continued bleeding from incision. 6. Increased pain, redness, or drainage from the incision  The clinic staff is available to answer your questions during regular business hours.  Please dont hesitate to call and ask to speak to one of the nurses for clinical concerns.  If you have a medical emergency, go to the nearest emergency  room or call 911.  A surgeon from [facility] is always on call at the hospital   [address], [place], [place], [place]  [address] ?  [address], [place], [place]   [address] [phone] ? [phone] ? FAX [phone] Web site: [email or web address] Post Anesthesia Home Care Instructions  Activity: Get plenty of rest for the remainder of the day. A responsible adult should stay with you for 24 hours following the procedure.  For the next 24 hours, DO NOT: -Drive a car -Paediatric nurse -Drink alcoholic beverages -Take any medication unless instructed by your physician -Make any legal decisions or sign important papers.  Meals: Start with liquid foods such as gelatin or soup. Progress to regular foods as tolerated. Avoid greasy, spicy, heavy foods. If nausea and/or vomiting occur, drink only clear liquids until the nausea and/or vomiting subsides. Call your physician if vomiting continues.  Special Instructions/Symptoms: Your throat may feel dry or sore from the anesthesia or the breathing tube placed in your throat during surgery. If this causes discomfort, gargle with warm salt water. The discomfort should disappear within 24 hours.  If you had a scopolamine patch placed behind your ear for the management of post- operative nausea and/or vomiting:  1. The medication in the patch is effective for 72 hours, after which it should be removed.  Wrap patch in a tissue and discard in the trash. Wash hands thoroughly with soap and water. 2. You may remove the patch earlier than 72 hours if you experience unpleasant side effects which may include dry mouth, dizziness or visual disturbances. 3. Avoid touching the patch. Wash your hands with soap and water after contact with the patch.

## 2016-07-10 NOTE — Anesthesia Procedure Notes (Signed)
Procedure Name: LMA Insertion Date/Time: 07/10/2016 12:40 PM Performed by: Guerin Lashomb D Pre-anesthesia Checklist: Patient identified, Emergency Drugs available, Suction available and Patient being monitored Patient Re-evaluated:Patient Re-evaluated prior to inductionOxygen Delivery Method: Circle system utilized Preoxygenation: Pre-oxygenation with 100% oxygen Intubation Type: IV induction Ventilation: Mask ventilation without difficulty LMA: LMA inserted LMA Size: 5.0 Number of attempts: 1 Airway Equipment and Method: Bite block Placement Confirmation: positive ETCO2 Tube secured with: Tape Dental Injury: Teeth and Oropharynx as per pre-operative assessment

## 2016-07-10 NOTE — Interval H&P Note (Signed)
History and Physical Interval Note:no change in H and P  07/10/2016 12:16 PM  [name]  has presented today for surgery, with the diagnosis of umbilical hernia  The various methods of treatment have been discussed with the patient and family. After consideration of risks, benefits and other options for treatment, the patient has consented to  Procedure(s): UMBILICAL HERNIA REPAIR (N/A) INSERTION OF MESH (N/A) as a surgical intervention .  The patient's history has been reviewed, patient examined, no change in status, stable for surgery.  I have reviewed the patient's chart and labs.  Questions were answered to the patient's satisfaction.     Chiquetta Langner A

## 2016-07-10 NOTE — H&P (Signed)
[name] 06/18/2016 9:14 AM Location: [facility] Patient #: [id number] DOB: Jun 25, 1956 Married / Language: Cleophus Molt / Race: White Male   History of Present Illness Douglas Canary A. Ninfa Linden MD; 06/18/2016 9:28 AM) Patient words: New-umb hernia.  The patient is a 60 year old male who presents with an umbilical hernia. This gentleman is well known to me. He is referred by Dr. [name] for evaluation of a symptomatic umbilical hernia. He has been exercising regularly losing weight and is just noticed the hernia several months ago. He is now having abdominal pain with this. The pain will go away when he lays down and push the hernia back in. He has had no objective symptoms. He is otherwise without complaints.   Other Problems Malachi Bonds, CMA; 06/18/2016 9:14 AM) Anxiety Disorder Gastroesophageal Reflux Disease High blood pressure  Past Surgical History Malachi Bonds, CMA; 06/18/2016 9:14 AM) Colon Polyp Removal - Colonoscopy Foot Surgery Left. Hip Surgery Right. Knee Surgery Bilateral. Vasectomy  Diagnostic Studies History Malachi Bonds, CMA; 06/18/2016 9:14 AM) Colonoscopy 1-5 years ago  Allergies Malachi Bonds, CMA; 06/18/2016 9:15 AM) No Known Drug Allergies08/17/2017  Medication History Malachi Bonds, CMA; 06/18/2016 9:16 AM) Hydrocodone-Acetaminophen (5-325MG  Tablet, Oral) Active. Lisinopril (20MG  Tablet, Oral) Active. AmLODIPine Besylate (10MG  Tablet, Oral) Active. Multivitamin Adult (Oral) Active. Medications Reconciled  Social History Malachi Bonds, CMA; 06/18/2016 9:14 AM) Alcohol use Moderate alcohol use. Caffeine use Coffee. Tobacco use Never smoker.  Family History Malachi Bonds, CMA; 06/18/2016 9:14 AM) Alcohol Abuse Brother. Arthritis Mother. Colon Cancer Father. Diabetes Mellitus Father. Heart Disease Mother. Melanoma Father.    Review of Systems Malachi Bonds CMA; 06/18/2016 9:14 AM) General Not  Present- Appetite Loss, Chills, Fatigue, Fever, Night Sweats, Weight Gain and Weight Loss. Skin Not Present- Change in Wart/Mole, Dryness, Hives, Jaundice, New Lesions, Non-Healing Wounds, Rash and Ulcer. HEENT Not Present- Earache, Hearing Loss, Hoarseness, Nose Bleed, Oral Ulcers, Ringing in the Ears, Seasonal Allergies, Sinus Pain, Sore Throat, Visual Disturbances, Wears glasses/contact lenses and Yellow Eyes. Respiratory Present- Snoring. Not Present- Bloody sputum, Chronic Cough, Difficulty Breathing and Wheezing. Breast Not Present- Breast Mass, Breast Pain, Nipple Discharge and Skin Changes. Cardiovascular Not Present- Chest Pain, Difficulty Breathing Lying Down, Leg Cramps, Palpitations, Rapid Heart Rate, Shortness of Breath and Swelling of Extremities. Gastrointestinal Present- Abdominal Pain. Not Present- Bloating, Bloody Stool, Change in Bowel Habits, Chronic diarrhea, Constipation, Difficulty Swallowing, Excessive gas, Gets full quickly at meals, Hemorrhoids, Indigestion, Nausea, Rectal Pain and Vomiting. Male Genitourinary Not Present- Blood in Urine, Change in Urinary Stream, Frequency, Impotence, Nocturia, Painful Urination, Urgency and Urine Leakage. Musculoskeletal Present- Joint Pain. Not Present- Back Pain, Joint Stiffness, Muscle Pain, Muscle Weakness and Swelling of Extremities. Psychiatric Not Present- Anxiety, Bipolar, Change in Sleep Pattern, Depression, Fearful and Frequent crying. Endocrine Not Present- Cold Intolerance, Excessive Hunger, Hair Changes, Heat Intolerance, Hot flashes and New Diabetes. Hematology Not Present- Blood Thinners, Easy Bruising, Excessive bleeding, Gland problems, HIV and Persistent Infections.  Vitals (Chemira Jones CMA; 06/18/2016 9:15 AM) 06/18/2016 9:15 AM Weight: 255.4 lb Height: 75in Body Surface Area: 2.43 m Body Mass Index: 31.92 kg/m  Temp.: 61F(Oral)  Pulse: 68 (Regular)  BP: 108/76 (Sitting, Left Arm,  Standard)       Physical Exam (Keyauna Graefe A. Ninfa Linden MD; 06/18/2016 9:28 AM) The physical exam findings are as follows: Note:He is well appearance. Lungs clear CV RRR Abdomen is soft, non distended There is an umbilical hernia which I'm able to reduce. It is mildly tender. Skin without rash  Assessment & Plan (Zonya Gudger A. Ninfa Linden MD; [id number] AM) UMBILICAL HERNIA ([id number]) Impression: I discussed the diagnosis with him in detail. Umbilical hernia repair with mesh is recommended. I discussed the surgical procedure with him in detail including the risks. He understands and wished to proceed with surgery which will be scheduled

## 2016-07-10 NOTE — Transfer of Care (Signed)
Immediate Anesthesia Transfer of Care Note  Patient: [name]  Procedure(s) Performed: Procedure(s): UMBILICAL HERNIA REPAIR (N/A) INSERTION OF MESH (N/A)  Patient Location: PACU  Anesthesia Type:General  Level of Consciousness: awake and patient cooperative  Airway & Oxygen Therapy: Patient Spontanous Breathing and Patient connected to face mask oxygen  Post-op Assessment: Report given to RN and Post -op Vital signs reviewed and stable  Post vital signs: Reviewed and stable  Last Vitals:  Vitals:   07/10/16 1141  BP: 132/79  Pulse: 68  Resp: 20  Temp: 36.8 C    Last Pain:  Vitals:   07/10/16 1141  TempSrc: Oral         Complications: No apparent anesthesia complications

## 2016-07-10 NOTE — Op Note (Signed)
UMBILICAL HERNIA REPAIR, INSERTION OF MESH  Procedure Note  DAUD BLASINGAME 07/10/2016   Pre-op Diagnosis: umbilical hernia     Post-op Diagnosis: SAME  Procedure(s): UMBILICAL HERNIA REPAIR INSERTION OF MESH (4.3 cm ventralite ST)  Surgeon(s): Coralie Keens, MD  Anesthesia: General  Staff:  Circulator: Maurene Capes, RN Scrub Person: Rhea Pink Neiers, CST  Estimated Blood Loss: Minimal                         Ulrich Soules A   Date: 07/10/2016  Time: 1:18 PM

## 2016-07-10 NOTE — Anesthesia Postprocedure Evaluation (Signed)
Anesthesia Post Note  Patient: [name]  Procedure(s) Performed: Procedure(s) (LRB): UMBILICAL HERNIA REPAIR (N/A) INSERTION OF MESH (N/A)  Patient location during evaluation: PACU Anesthesia Type: General Level of consciousness: awake and alert Pain management: pain level controlled Vital Signs Assessment: post-procedure vital signs reviewed and stable Respiratory status: spontaneous breathing, nonlabored ventilation and respiratory function stable Cardiovascular status: blood pressure returned to baseline and stable Postop Assessment: no signs of nausea or vomiting Anesthetic complications: no    Last Vitals:  Vitals:   07/10/16 1400 07/10/16 1419  BP: (!) 143/82 (!) 156/88  Pulse: 87 83  Resp: 20 16  Temp:  36.6 C    Last Pain:  Vitals:   07/10/16 1419  TempSrc: Oral  PainSc: 4                  Riddick Nuon,W. EDMOND

## 2016-07-10 NOTE — Anesthesia Preprocedure Evaluation (Addendum)
Anesthesia Evaluation  Patient identified by MRN, date of birth, ID band Patient awake    Reviewed: Allergy & Precautions, H&P , NPO status , Patient's Chart, lab work & pertinent test results  Airway Mallampati: III  TM Distance: >3 FB Neck ROM: Full    Dental no notable dental hx. (+) Teeth Intact, Dental Advisory Given   Pulmonary neg pulmonary ROS,    Pulmonary exam normal breath sounds clear to auscultation       Cardiovascular hypertension, + Valvular Problems/Murmurs MR  Rhythm:Regular Rate:Normal + Systolic murmurs    Neuro/Psych Anxiety negative neurological ROS  negative psych ROS   GI/Hepatic Neg liver ROS, GERD  Medicated and Controlled,  Endo/Other  negative endocrine ROS  Renal/GU negative Renal ROS  negative genitourinary   Musculoskeletal  (+) Arthritis , Osteoarthritis,    Abdominal   Peds  Hematology negative hematology ROS (+)   Anesthesia Other Findings   Reproductive/Obstetrics negative OB ROS                            Anesthesia Physical Anesthesia Plan  ASA: III  Anesthesia Plan: General   Post-op Pain Management:    Induction: Intravenous  Airway Management Planned: LMA and Oral ETT  Additional Equipment:   Intra-op Plan:   Post-operative Plan: Extubation in OR  Informed Consent: I have reviewed the patients History and Physical, chart, labs and discussed the procedure including the risks, benefits and alternatives for the proposed anesthesia with the patient or authorized representative who has indicated his/her understanding and acceptance.   Dental advisory given  Plan Discussed with: CRNA  Anesthesia Plan Comments:         Anesthesia Quick Evaluation

## 2016-07-13 ENCOUNTER — Encounter (HOSPITAL_BASED_OUTPATIENT_CLINIC_OR_DEPARTMENT_OTHER): Payer: Self-pay | Admitting: Surgery

## 2016-07-13 NOTE — Op Note (Signed)
NAMEORAS, SPLITT                ACCOUNT NO.:  [id number]  MEDICAL RECORD NO.:  [id number]  LOCATION:                                 FACILITY:  PHYSICIAN:  Coralie Keens, M.D. DATE OF BIRTH:  [birth year]  DATE OF PROCEDURE:  07/10/2016 DATE OF DISCHARGE:                              OPERATIVE REPORT   PREOPERATIVE DIAGNOSIS:  Umbilical hernia.  POSTOPERATIVE DIAGNOSIS:  Umbilical hernia.  PROCEDURE PERFORMED:  Umbilical hernia repair with mesh (4.3 cm Ventralight ST mesh from [employer]).  SURGEON:  Coralie Keens, M.D.  ANESTHESIA:  General and 0.5% Marcaine.  ESTIMATED BLOOD LOSS:  Minimal.  PROCEDURE IN DETAIL:  The patient was brought to the operating room, identified as [name].  He was placed supine on the operating table.  General anesthesia was induced.  His abdomen was then prepped and draped in usual sterile fashion.  I anesthetized skin in the lower edge of the umbilicus with Marcaine.  I then made a small transverse incision at the lower edge of the umbilicus with a scalpel.  I took this down to the hernia sac which I then separated from the overlying umbilical skin with electrocautery.  I then dissected the sac down to the surrounding fascia.  I then excised the sac completely and reduced the omentum back into the abdominal cavity.  The fascial defect was approximately 1.5 cm in size.  I brought a 4.3 cm round Ventralight ST patch onto the field.  I placed it through the fascial opening and pulled it up against the peritoneum with stay ties.  The mesh was then sewn in place circumferentially with #1 Novafil sutures.  I then cut the stay ties and closed the fascia over the top of the mesh with a figure- of-eight #1 Novafil sutures.  Wide coverage of the fascial defect and closure the fascia appear to be achieved.  I then anesthetized the fascia and skin further with Marcaine.  I then tacked the umbilical skin back in place with 3-0 Vicryl suture.  I then  closed the subcutaneous tissue with interrupted 3-0 Vicryl sutures and closed the skin with a running 4-0 Monocryl.  Skin glue was then applied.  The patient tolerated the procedure well.  All sponge, needle, and instrument counts were correct at the end of procedure.  The patient was then extubated in the operating room and taken in stable condition to recovery room.     Coralie Keens, M.D.   ______________________________ Coralie Keens, M.D.    DB/MEDQ  D:  07/10/2016  T:  07/11/2016  Job:  [id number]

## 2016-08-01 ENCOUNTER — Other Ambulatory Visit (INDEPENDENT_AMBULATORY_CARE_PROVIDER_SITE_OTHER): Payer: Self-pay | Admitting: Orthopaedic Surgery

## 2016-08-01 DIAGNOSIS — M25552 Pain in left hip: Secondary | ICD-10-CM

## 2016-08-12 ENCOUNTER — Other Ambulatory Visit (INDEPENDENT_AMBULATORY_CARE_PROVIDER_SITE_OTHER): Payer: Self-pay | Admitting: Orthopaedic Surgery

## 2016-08-12 DIAGNOSIS — T1590XA Foreign body on external eye, part unspecified, unspecified eye, initial encounter: Secondary | ICD-10-CM

## 2016-08-14 ENCOUNTER — Ambulatory Visit
Admission: RE | Admit: 2016-08-14 | Discharge: 2016-08-14 | Disposition: A | Discharge status: Home / Self Care | Payer: BLUE CROSS/BLUE SHIELD | Source: Ambulatory Visit | Attending: Orthopaedic Surgery | Admitting: Orthopaedic Surgery

## 2016-08-14 DIAGNOSIS — T1590XA Foreign body on external eye, part unspecified, unspecified eye, initial encounter: Secondary | ICD-10-CM

## 2016-08-15 ENCOUNTER — Ambulatory Visit
Admission: RE | Admit: 2016-08-15 | Discharge: 2016-08-15 | Disposition: A | Discharge status: Home / Self Care | Payer: BLUE CROSS/BLUE SHIELD | Source: Ambulatory Visit | Attending: Orthopaedic Surgery | Admitting: Orthopaedic Surgery

## 2016-08-15 DIAGNOSIS — M25552 Pain in left hip: Secondary | ICD-10-CM

## 2016-08-27 ENCOUNTER — Telehealth (INDEPENDENT_AMBULATORY_CARE_PROVIDER_SITE_OTHER): Payer: Self-pay | Admitting: *Deleted

## 2016-08-27 NOTE — Telephone Encounter (Signed)
PT. Called stating he would like to know the results of the MRI he had on 10/14. Pt. Requesting call back at [phone].

## 2016-08-27 NOTE — Telephone Encounter (Signed)
Please advise 

## 2016-08-31 ENCOUNTER — Telehealth (INDEPENDENT_AMBULATORY_CARE_PROVIDER_SITE_OTHER): Payer: Self-pay | Admitting: *Deleted

## 2016-08-31 MED ORDER — HYDROCODONE-ACETAMINOPHEN 5-325 MG PO TABS: 1.0000 | ORAL_TABLET | Freq: Two times a day (BID) | ORAL | 0 refills | Status: DC | PRN

## 2016-08-31 MED ORDER — HYDROCODONE-ACETAMINOPHEN 5-325 MG PO TABS: 1.0000 | ORAL_TABLET | Freq: Four times a day (QID) | ORAL | 0 refills | Status: DC | PRN

## 2016-08-31 NOTE — Telephone Encounter (Signed)
Patient aware Rx ready at front desk  

## 2016-08-31 NOTE — Telephone Encounter (Signed)
Pt. Called stating he received a call Friday about his MRI but was wondering what the next step was for him. Also pt. Would like a prescription refill of hydrocodone. Pt. Requesting call back at [phone].

## 2016-08-31 NOTE — Telephone Encounter (Signed)
He cn come and pick up a script and try to use the narcotics sparingly.  Really nothing else to do for the hip except for a possible steroid injection in his hip if he wants one

## 2016-08-31 NOTE — Telephone Encounter (Signed)
Please advise 

## 2017-01-29 ENCOUNTER — Encounter: Payer: Self-pay | Admitting: Cardiovascular Disease

## 2017-03-04 ENCOUNTER — Telehealth (HOSPITAL_COMMUNITY): Payer: Self-pay | Admitting: Cardiovascular Disease

## 2017-03-10 NOTE — Telephone Encounter (Signed)
  03/04/2017 02:45 PM Phone (Outgoing) Nickolis, Diel (Self) [phone] (M)   Left Message - Called pt and lmsg for him to CB to schedule echo.     By Cherie Dark A           Close PreviousNext

## 2017-04-09 ENCOUNTER — Ambulatory Visit (HOSPITAL_COMMUNITY): Payer: BLUE CROSS/BLUE SHIELD | Attending: Cardiovascular Disease

## 2017-04-09 ENCOUNTER — Other Ambulatory Visit: Payer: Self-pay

## 2017-04-09 DIAGNOSIS — I340 Nonrheumatic mitral (valve) insufficiency: Secondary | ICD-10-CM | POA: Diagnosis present

## 2017-04-09 DIAGNOSIS — I341 Nonrheumatic mitral (valve) prolapse: Secondary | ICD-10-CM | POA: Diagnosis not present

## 2017-04-09 DIAGNOSIS — I10 Essential (primary) hypertension: Secondary | ICD-10-CM | POA: Diagnosis present

## 2017-04-12 ENCOUNTER — Other Ambulatory Visit: Payer: Self-pay

## 2017-04-12 DIAGNOSIS — I10 Essential (primary) hypertension: Secondary | ICD-10-CM

## 2017-04-12 DIAGNOSIS — I340 Nonrheumatic mitral (valve) insufficiency: Secondary | ICD-10-CM

## 2017-04-28 ENCOUNTER — Encounter: Payer: Self-pay | Admitting: Gastroenterology

## 2017-07-21 ENCOUNTER — Encounter: Payer: Self-pay | Admitting: Gastroenterology

## 2017-09-03 ENCOUNTER — Ambulatory Visit (AMBULATORY_SURGERY_CENTER): Payer: Self-pay

## 2017-09-03 VITALS — Ht 75.0 in | Wt 262.0 lb

## 2017-09-03 DIAGNOSIS — Z86010 Personal history of colonic polyps: Secondary | ICD-10-CM

## 2017-09-03 MED ORDER — NA SULFATE-K SULFATE-MG SULF 17.5-3.13-1.6 GM/177ML PO SOLN: 1.0000 | Freq: Once | ORAL | 0 refills | Status: AC

## 2017-09-03 NOTE — Progress Notes (Signed)
Denies allergies to eggs or soy products. Denies complication of anesthesia or sedation. Denies use of weight loss medication. Denies use of O2.   Emmi instructions declined.  

## 2017-09-08 ENCOUNTER — Encounter: Payer: Self-pay | Admitting: Gastroenterology

## 2017-09-17 ENCOUNTER — Ambulatory Visit (AMBULATORY_SURGERY_CENTER): Payer: BLUE CROSS/BLUE SHIELD | Admitting: Gastroenterology

## 2017-09-17 ENCOUNTER — Encounter: Payer: Self-pay | Admitting: Gastroenterology

## 2017-09-17 VITALS — BP 130/81 | HR 52 | Temp 96.6°F | Resp 18 | Ht 75.0 in | Wt 262.0 lb

## 2017-09-17 DIAGNOSIS — K635 Polyp of colon: Secondary | ICD-10-CM

## 2017-09-17 DIAGNOSIS — D120 Benign neoplasm of cecum: Secondary | ICD-10-CM | POA: Diagnosis not present

## 2017-09-17 DIAGNOSIS — Z800 Family history of malignant neoplasm of digestive organs: Secondary | ICD-10-CM

## 2017-09-17 DIAGNOSIS — D125 Benign neoplasm of sigmoid colon: Secondary | ICD-10-CM | POA: Diagnosis not present

## 2017-09-17 DIAGNOSIS — Z86010 Personal history of colonic polyps: Secondary | ICD-10-CM | POA: Diagnosis present

## 2017-09-17 MED ORDER — SODIUM CHLORIDE 0.9 % IV SOLN: 500.0000 mL | INTRAVENOUS | Status: DC

## 2017-09-17 NOTE — Progress Notes (Signed)
To recovery, report to RN, VSS. 

## 2017-09-17 NOTE — Op Note (Signed)
[facility] Patient Name: [name] Procedure Date: 09/17/2017 10:33 AM MRN: [id number] Endoscopist: Ladene Artist , MD Age: 61 Referring MD:  Date of Birth: 1955/11/26 Gender: Male Account #: [id number] Procedure:                Colonoscopy Indications:              Surveillance: Personal history of adenomatous                            polyps on last colonoscopy 5 years ago. Family                            history of colon cancer. Medicines:                Monitored Anesthesia Care Procedure:                Pre-Anesthesia Assessment:                           - Prior to the procedure, a History and Physical                            was performed, and patient medications and                            allergies were reviewed. The patient's tolerance of                            previous anesthesia was also reviewed. The risks                            and benefits of the procedure and the sedation                            options and risks were discussed with the patient.                            All questions were answered, and informed consent                            was obtained. Prior Anticoagulants: The patient has                            taken no previous anticoagulant or antiplatelet                            agents. ASA Grade Assessment: II - A patient with                            mild systemic disease. After reviewing the risks                            and benefits, the patient was deemed in  satisfactory condition to undergo the procedure.                           After obtaining informed consent, the colonoscope                            was passed under direct vision. Throughout the                            procedure, the patient's blood pressure, pulse, and                            oxygen saturations were monitored continuously. The                            Model PCF-[id number]) scope was  introduced                            through the anus and advanced to the the cecum,                            identified by appendiceal orifice and ileocecal                            valve. The ileocecal valve, appendiceal orifice,                            and rectum were photographed. The quality of the                            bowel preparation was good. The colonoscopy was                            performed without difficulty. The patient tolerated                            the procedure well. Scope In: 10:56:48 AM Scope Out: 11:09:03 AM Scope Withdrawal Time: 0 hours 10 minutes 16 seconds  Total Procedure Duration: 0 hours 12 minutes 15 seconds  Findings:                 The perianal and digital rectal examinations were                            normal.                           A 6 mm polyp was found in the sigmoid colon. The                            polyp was sessile. The polyp was removed with a                            cold snare. Resection and retrieval were complete.  A 5 mm polyp was found in the cecum. The polyp was                            sessile. The polyp was removed with a cold biopsy                            forceps. Resection and retrieval were complete.                           Multiple medium-mouthed diverticula were found in                            the sigmoid colon, descending colon and transverse                            colon. There was no evidence of diverticular                            bleeding.                           Internal hemorrhoids were found during                            retroflexion. The hemorrhoids were small and Grade                            I (internal hemorrhoids that do not prolapse).                           The exam was otherwise without abnormality on                            direct and retroflexion views. Complications:            No immediate complications. Estimated blood  loss:                            None. Estimated Blood Loss:     Estimated blood loss: none. Impression:               - One 6 mm polyp in the sigmoid colon, removed with                            a cold snare. Resected and retrieved.                           - One 5 mm polyp in the cecum, removed with a cold                            biopsy forceps. Resected and retrieved.                           - Moderate diverticulosis in the sigmoid colon, in  the descending colon and in the transverse colon.                            There was no evidence of diverticular bleeding.                           - Internal hemorrhoids.                           - The examination was otherwise normal on direct                            and retroflexion views. Recommendation:           - Repeat colonoscopy in 5 years for surveillance.                           - Patient has a contact number available for                            emergencies. The signs and symptoms of potential                            delayed complications were discussed with the                            patient. Return to normal activities tomorrow.                            Written discharge instructions were provided to the                            patient.                           - High fiber diet.                           - Continue present medications.                           - Await pathology results. Ladene Artist, MD 09/17/2017 11:13:25 AM This report has been signed electronically.

## 2017-09-17 NOTE — Progress Notes (Signed)
Called to room to assist during endoscopic procedure.  Patient ID and intended procedure confirmed with present staff. Received instructions for my participation in the procedure from the performing physician.  

## 2017-09-17 NOTE — Patient Instructions (Signed)
YOU HAD AN ENDOSCOPIC PROCEDURE TODAY AT THE [facility]:   Refer to the procedure report that was given to you for any specific questions about what was found during the examination.  If the procedure report does not answer your questions, please call your gastroenterologist to clarify.  If you requested that your care partner not be given the details of your procedure findings, then the procedure report has been included in a sealed envelope for you to review at your convenience later.  YOU SHOULD EXPECT: Some feelings of bloating in the abdomen. Passage of more gas than usual.  Walking can help get rid of the air that was put into your GI tract during the procedure and reduce the bloating. If you had a lower endoscopy (such as a colonoscopy or flexible sigmoidoscopy) you may notice spotting of blood in your stool or on the toilet paper. If you underwent a bowel prep for your procedure, you may not have a normal bowel movement for a few days.  Please Note:  You might notice some irritation and congestion in your nose or some drainage.  This is from the oxygen used during your procedure.  There is no need for concern and it should clear up in a day or so.  SYMPTOMS TO REPORT IMMEDIATELY:   Following lower endoscopy (colonoscopy or flexible sigmoidoscopy):  Excessive amounts of blood in the stool  Significant tenderness or worsening of abdominal pains  Swelling of the abdomen that is new, acute  Fever of 100F or higher   For urgent or emergent issues, a gastroenterologist can be reached at any hour by calling [phone].   DIET:  We do recommend a small meal at first, but then you may proceed to your regular diet.  Drink plenty of fluids but you should avoid alcoholic beverages for 24 hours.  ACTIVITY:  You should plan to take it easy for the rest of today and you should NOT DRIVE or use heavy machinery until tomorrow (because of the sedation medicines used during the test).     FOLLOW UP: Our staff will call the number listed on your records the next business day following your procedure to check on you and address any questions or concerns that you may have regarding the information given to you following your procedure. If we do not reach you, we will leave a message.  However, if you are feeling well and you are not experiencing any problems, there is no need to return our call.  We will assume that you have returned to your regular daily activities without incident.  If any biopsies were taken you will be contacted by phone or by letter within the next 1-3 weeks.  Please call us at [phone] if you have not heard about the biopsies in 3 weeks.    SIGNATURES/CONFIDENTIALITY: You and/or your care partner have signed paperwork which will be entered into your electronic medical record.  These signatures attest to the fact that that the information above on your After Visit Summary has been reviewed and is understood.  Full responsibility of the confidentiality of this discharge information lies with you and/or your care-partner.  Read all of the handouts given to you by your recovery room nurse. 

## 2017-09-20 ENCOUNTER — Telehealth: Payer: Self-pay | Admitting: *Deleted

## 2017-09-20 NOTE — Telephone Encounter (Signed)
Line busy   Lenard Galloway RN

## 2017-09-20 NOTE — Telephone Encounter (Signed)
    Lm we will try back later today  Lenard Galloway RN

## 2017-09-20 NOTE — Telephone Encounter (Signed)
  Follow up Call-  Call back number 09/17/2017  Post procedure Call Back phone  # [phone]  Permission to leave phone message Yes  Some recent data might be hidden     Patient questions:  Do you have a fever, pain , or abdominal swelling? No. Pain Score  0 *  Have you tolerated food without any problems? Yes.    Have you been able to return to your normal activities? Yes.    Do you have any questions about your discharge instructions: Diet   No. Medications  No. Follow up visit  No.  Do you have questions or concerns about your Care? No.  Actions: * If pain score is 4 or above: No action needed, pain <4.

## 2017-09-29 ENCOUNTER — Encounter: Payer: Self-pay | Admitting: Gastroenterology

## 2018-02-04 DIAGNOSIS — I340 Nonrheumatic mitral (valve) insufficiency: Secondary | ICD-10-CM | POA: Diagnosis not present

## 2018-02-04 DIAGNOSIS — Z0000 Encounter for general adult medical examination without abnormal findings: Secondary | ICD-10-CM | POA: Diagnosis not present

## 2018-02-04 DIAGNOSIS — Z6831 Body mass index (BMI) 31.0-31.9, adult: Secondary | ICD-10-CM | POA: Diagnosis not present

## 2018-02-04 DIAGNOSIS — I10 Essential (primary) hypertension: Secondary | ICD-10-CM | POA: Diagnosis not present

## 2018-02-04 DIAGNOSIS — Z13220 Encounter for screening for lipoid disorders: Secondary | ICD-10-CM | POA: Diagnosis not present

## 2018-04-27 ENCOUNTER — Other Ambulatory Visit: Payer: Self-pay

## 2018-04-27 ENCOUNTER — Ambulatory Visit (HOSPITAL_COMMUNITY): Payer: BLUE CROSS/BLUE SHIELD | Attending: Cardiology

## 2018-04-27 DIAGNOSIS — Z8249 Family history of ischemic heart disease and other diseases of the circulatory system: Secondary | ICD-10-CM | POA: Diagnosis not present

## 2018-04-27 DIAGNOSIS — I340 Nonrheumatic mitral (valve) insufficiency: Secondary | ICD-10-CM | POA: Insufficient documentation

## 2018-04-27 DIAGNOSIS — I10 Essential (primary) hypertension: Secondary | ICD-10-CM | POA: Insufficient documentation

## 2018-04-29 ENCOUNTER — Other Ambulatory Visit: Payer: Self-pay | Admitting: *Deleted

## 2018-04-29 DIAGNOSIS — I340 Nonrheumatic mitral (valve) insufficiency: Secondary | ICD-10-CM

## 2018-06-14 ENCOUNTER — Telehealth (INDEPENDENT_AMBULATORY_CARE_PROVIDER_SITE_OTHER): Payer: Self-pay | Admitting: Physician Assistant

## 2018-06-14 ENCOUNTER — Ambulatory Visit (INDEPENDENT_AMBULATORY_CARE_PROVIDER_SITE_OTHER): Payer: BLUE CROSS/BLUE SHIELD | Admitting: Physician Assistant

## 2018-06-14 ENCOUNTER — Other Ambulatory Visit (INDEPENDENT_AMBULATORY_CARE_PROVIDER_SITE_OTHER): Payer: Self-pay

## 2018-06-14 ENCOUNTER — Encounter (INDEPENDENT_AMBULATORY_CARE_PROVIDER_SITE_OTHER): Payer: Self-pay | Admitting: Physician Assistant

## 2018-06-14 DIAGNOSIS — S76311A Strain of muscle, fascia and tendon of the posterior muscle group at thigh level, right thigh, initial encounter: Secondary | ICD-10-CM

## 2018-06-14 MED ORDER — HYDROCODONE-ACETAMINOPHEN 5-325 MG PO TABS: 1.0000 | ORAL_TABLET | Freq: Four times a day (QID) | ORAL | 0 refills | Status: DC | PRN

## 2018-06-14 MED ORDER — METHYLPREDNISOLONE 4 MG PO TABS: ORAL_TABLET | ORAL | 0 refills | Status: DC

## 2018-06-14 MED ORDER — METHOCARBAMOL 500 MG PO TABS: 500.0000 mg | ORAL_TABLET | Freq: Three times a day (TID) | ORAL | 1 refills | Status: DC

## 2018-06-14 NOTE — Telephone Encounter (Signed)
Patient aware Rx at front desk

## 2018-06-14 NOTE — Telephone Encounter (Signed)
Patient called left voicemail message stating he got Rx for Robaxin but, did not get the Rx for Hydrocodone. Patient said he will pick up Rx. The number to contact patient is [phone]

## 2018-06-14 NOTE — Progress Notes (Signed)
Office Visit Note   Patient: [name]           Date of Birth: [birth year]           MRN: [id number] Visit Date: 06/14/2018              Requested by: [name], MD [address], [place] [address] PCP: [name], MD   Assessment & Plan: Visit Diagnoses:  1. Strain of right hamstring muscle, initial encounter     Plan: He is activities as tolerated.  Avoid strenuous activities.  He will apply moist heat to the thigh.  Placed him on Robaxin to take primarily at night.  Also placed him on a Medrol Dosepak and is given Norco for pain.  He will follow-up with [place] in 2 weeks if his pain persist AP lateral views of the right thigh and most likely will order MRI of the right thigh to evaluate for hamstring tear.  Follow-Up Instructions: Return in about 2 weeks (around 06/28/2018).   Orders:  No orders of the defined types were placed in this encounter.  Meds ordered this encounter  Medications  . HYDROcodone-acetaminophen (NORCO) 5-325 MG tablet    Sig: Take 1 tablet by mouth every 6 (six) hours as needed for moderate pain. One to two tabs every 4-6 hours for pain    Dispense:  30 tablet    Refill:  0  . methocarbamol (ROBAXIN) 500 MG tablet    Sig: Take 1 tablet (500 mg total) by mouth 3 (three) times daily.    Dispense:  40 tablet    Refill:  1  . methylPREDNISolone (MEDROL) 4 MG tablet    Sig: Take as directed    Dispense:  21 tablet    Refill:  0      Procedures: No procedures performed   Clinical Data: No additional findings.   Subjective: Chief Complaint  Patient presents with  . Right Leg - Pain    HPI [name] is well-known to [name] service comes in today with a new complaint of right thigh pain for 1 month no known injury.  States it feels like muscle pain is having no numbness tingling down the legs.  No known injury.  States he just got out of bed one day and had sharp pain in the hamstring region of the right  thigh. Review of Systems Denies fevers or chills.  Please see HPI otherwise negative  Objective: Vital Signs: There were no vitals taken for this visit.  Physical Exam  Constitutional: He is oriented to person, place, and time. He appears well-developed and well-nourished. No distress.  Cardiovascular: Intact distal pulses.  Pulmonary/Chest: Effort normal.  Neurological: He is alert and oriented to person, place, and time.  Skin: He is not diaphoretic.    Ortho Exam Good range of motion bilateral hips without pain.  No tenderness over the trochanteric region.  Negative straight leg raise bilaterally.  Right thigh he has tenderness in the mid hamstring region with palpation.  There is no bruises erythema edema or ecchymosis of either thigh.  Good range of motion bilateral knees without pain.  Surgical incisions from bilateral knee replacements well-healed.  No instability of either knee with valgus varus stressing. Specialty Comments:  No specialty comments available.  Imaging: No results found.   PMFS History: Patient Active Problem List   Diagnosis Date Noted  . Essential hypertension 02/07/2016  . Mitral regurgitation 02/07/2016  . Arthritis  of left knee 10/23/2014  . Status post total left knee replacement 10/23/2014  . Arthritis of knee, right 01/19/2014  . Status post total knee replacement 01/19/2014   Past Medical History:  Diagnosis Date  . Allergy   . Anxiety   . Arthritis    knees--S/P RIGHT TOTAL KNEE REPLACED 01/19/14   . GERD (gastroesophageal reflux disease)    hx of, not freq  WOULD USE TUMS IF NEEDED  . Heart murmur    PT STATES HIS MEDICAL DOCTOR MONITORING HIS MURMUR - NOT CAUSING PT ANY PROBLEMS- STATES HE WAS NOT AWARE OF MURMUR UNTIL PHYSICAL 1014  . History of colon polyps   . Hypertension    takes Cardura and NOrvasc daily  . Insomnia   . Mitral valve regurgitation    moderate to severe  . Seizures ([place])    had one approx. 30 yrs ago,has not  had any since    Family History  Problem Relation Age of Onset  . Hypertension Father   . Colon cancer Father   . Diabetes Mother   . Stomach cancer Neg Hx   . Esophageal cancer Neg Hx   . Pancreatic cancer Neg Hx   . Rectal cancer Neg Hx     Past Surgical History:  Procedure Laterality Date  . ANKLE FUSION  left   Dec. 2012  . ARTHROSCOPY KNEE W/ DRILLING  bilateral   2012  . COLONOSCOPY     x2  . FOOT ARTHRODESIS  06/23/2012   Procedure: ARTHRODESIS FOOT;  Surgeon: Douglas Simmer, MD;  Location: [facility];  Service: Orthopedics;  Laterality: Left;  Left Subtalar and Talonavicular Joint Revision Arthrodesis  Aspiration of Bone Marrow from Left Hip   . HAIR TRANSPLANT    . HARDWARE REMOVAL  06/23/2012   Procedure: HARDWARE REMOVAL;  Surgeon: Douglas Simmer, MD;  Location: [facility];  Service: Orthopedics;  Laterality: Left;  Removal of Deep Implant  X's 3  . INSERTION OF MESH N/A 07/10/2016   Procedure: INSERTION OF MESH;  Surgeon: Douglas Keens, MD;  Location: [facility];  Service: General;  Laterality: N/A;  . JOINT REPLACEMENT     right hip  01-2011  . LIMB SPARING RESECTION HIP W/ SADDLE JOINT REPLACEMENT Right   . TOTAL KNEE ARTHROPLASTY Right 01/19/2014   Procedure: RIGHT TOTAL KNEE ARTHROPLASTY, Steroid injection left knee;  Surgeon: Douglas Rossetti, MD;  Location: [facility];  Service: Orthopedics;  Laterality: Right;  . TOTAL KNEE ARTHROPLASTY Left 10/23/2014   Procedure: LEFT TOTAL KNEE ARTHROPLASTY;  Surgeon: Douglas Rossetti, MD;  Location: [facility];  Service: Orthopedics;  Laterality: Left;  . UMBILICAL HERNIA REPAIR N/A 07/10/2016   Procedure: UMBILICAL HERNIA REPAIR;  Surgeon: Douglas Keens, MD;  Location: [facility];  Service: General;  Laterality: N/A;   Social History   Occupational History  . Not on file  Tobacco Use  . Smoking status: Never Smoker  . Smokeless tobacco: Never Used  Substance and Sexual Activity  . Alcohol use: Yes     Comment: 1 drink daily  . Drug use: No  . Sexual activity: Yes

## 2018-06-27 ENCOUNTER — Telehealth (INDEPENDENT_AMBULATORY_CARE_PROVIDER_SITE_OTHER): Payer: Self-pay | Admitting: Physician Assistant

## 2018-06-27 MED ORDER — HYDROCODONE-ACETAMINOPHEN 5-325 MG PO TABS: 1.0000 | ORAL_TABLET | Freq: Four times a day (QID) | ORAL | 0 refills | Status: DC | PRN

## 2018-06-27 MED ORDER — METHOCARBAMOL 500 MG PO TABS: 500.0000 mg | ORAL_TABLET | Freq: Three times a day (TID) | ORAL | 1 refills | Status: DC

## 2018-06-27 NOTE — Telephone Encounter (Signed)
Patient wanted to cancel appt tomorrow due to feeling much better from meds, so he is requesting rx refill on robaxin and hydrocodone. Patients # [phone]

## 2018-06-27 NOTE — Telephone Encounter (Signed)
I sent them in.  He needs to use the hydrocodone sparingly, because we can't keep refilling it.

## 2018-06-27 NOTE — Telephone Encounter (Signed)
See below

## 2018-06-28 ENCOUNTER — Ambulatory Visit (INDEPENDENT_AMBULATORY_CARE_PROVIDER_SITE_OTHER): Payer: BLUE CROSS/BLUE SHIELD | Admitting: Orthopaedic Surgery

## 2018-06-28 NOTE — Telephone Encounter (Signed)
Patient aware of the below message  

## 2018-07-18 ENCOUNTER — Telehealth (INDEPENDENT_AMBULATORY_CARE_PROVIDER_SITE_OTHER): Payer: Self-pay | Admitting: Orthopaedic Surgery

## 2018-07-18 NOTE — Telephone Encounter (Signed)
Returned call to patient left message to call back. 

## 2018-07-20 ENCOUNTER — Ambulatory Visit (INDEPENDENT_AMBULATORY_CARE_PROVIDER_SITE_OTHER): Payer: Self-pay

## 2018-07-20 ENCOUNTER — Encounter (INDEPENDENT_AMBULATORY_CARE_PROVIDER_SITE_OTHER): Payer: Self-pay | Admitting: Orthopaedic Surgery

## 2018-07-20 ENCOUNTER — Ambulatory Visit (INDEPENDENT_AMBULATORY_CARE_PROVIDER_SITE_OTHER): Payer: BLUE CROSS/BLUE SHIELD | Admitting: Orthopaedic Surgery

## 2018-07-20 DIAGNOSIS — S76311A Strain of muscle, fascia and tendon of the posterior muscle group at thigh level, right thigh, initial encounter: Secondary | ICD-10-CM

## 2018-07-20 DIAGNOSIS — M79651 Pain in right thigh: Secondary | ICD-10-CM | POA: Diagnosis not present

## 2018-07-20 MED ORDER — HYDROCODONE-ACETAMINOPHEN 5-325 MG PO TABS: 1.0000 | ORAL_TABLET | Freq: Four times a day (QID) | ORAL | 0 refills | Status: DC | PRN

## 2018-07-20 NOTE — Progress Notes (Signed)
Office Visit Note   Patient: [name]           Date of Birth: 02-11-1956           MRN: [id number] Visit Date: 07/20/2018              Requested by: [name], MD [address], [place] [address] PCP: [name], MD   Assessment & Plan: Visit Diagnoses:  1. Right thigh pain   2. Strain of right hamstring muscle, initial encounter     Plan: We will send him to formal physical therapy to work on hamstring stretching modalities.  He is told that this may take up to 3 months to totally resolve.  He is given additional Norco today and it is discussed with him at length by Dr. Ninfa Linden he is to use these sparingly.   Follow-Up Instructions: Return if symptoms worsen or fail to improve.   Orders:  Orders Placed This Encounter  Procedures  . XR HIP UNILAT W OR W/O PELVIS 1V RIGHT   Meds ordered this encounter  Medications  . HYDROcodone-acetaminophen (NORCO/VICODIN) 5-325 MG tablet    Sig: Take 1 tablet by mouth every 6 (six) hours as needed for moderate pain.    Dispense:  30 tablet    Refill:  0      Procedures: No procedures performed   Clinical Data: No additional findings.   Subjective: Chief Complaint  Patient presents with  . Right Hip - Follow-up    HPI [name] returns today for follow-up of his right hamstring pain.  He states that the pain did get better for a period time letting me he returned back to activities his pain is gone back to severe as it was.  He states pain is worse with prolonged walking.  Also painful when rolling over in bed.  He is having no numbness tingling down the leg no back pain.  Having no groin pain.  He has tried compression sleeve for his thigh with no real relief. Review of Systems Please see HPI otherwise negative  Objective: Vital Signs: There were no vitals taken for this visit.  Physical Exam General: Well-developed well-nourished male in no acute distress mood affect appropriate.  Psych  alert and oriented x3. Ortho Exam Right hamstring no rashes skin lesions ulcerations.  He has some tenderness over the mid hamstring region with deep palpation.  Excellent range of motion of the right hip without pain.  Negative straight leg raise.  However he does have some pain in the right hamstring area consistent with tight hamstrings.  He ambulates without any assistive device. Specialty Comments:  No specialty comments available.  Imaging: Xr Hip Unilat W Or W/o Pelvis 1v Right  Result Date: 07/20/2018 AP pelvis and lateral view of the right hip: No acute fracture.  Status post right total hip arthroplasty with well-seated components.  No hardware failure.  Right hip is well located.    PMFS History: Patient Active Problem List   Diagnosis Date Noted  . Right hamstring muscle strain 07/20/2018  . Essential hypertension 02/07/2016  . Mitral regurgitation 02/07/2016  . Arthritis of left knee 10/23/2014  . Status post total left knee replacement 10/23/2014  . Arthritis of knee, right 01/19/2014  . Status post total knee replacement 01/19/2014   Past Medical History:  Diagnosis Date  . Allergy   . Anxiety   . Arthritis    knees--S/P RIGHT TOTAL KNEE REPLACED 01/19/14   .  GERD (gastroesophageal reflux disease)    hx of, not freq  WOULD USE TUMS IF NEEDED  . Heart murmur    PT STATES HIS MEDICAL DOCTOR MONITORING HIS MURMUR - NOT CAUSING PT ANY PROBLEMS- STATES HE WAS NOT AWARE OF MURMUR UNTIL PHYSICAL 1014  . History of colon polyps   . Hypertension    takes Cardura and NOrvasc daily  . Insomnia   . Mitral valve regurgitation    moderate to severe  . Seizures ([place])    had one approx. 30 yrs ago,has not had any since    Family History  Problem Relation Age of Onset  . Hypertension Father   . Colon cancer Father   . Diabetes Mother   . Stomach cancer Neg Hx   . Esophageal cancer Neg Hx   . Pancreatic cancer Neg Hx   . Rectal cancer Neg Hx     Past Surgical History:   Procedure Laterality Date  . ANKLE FUSION  left   Dec. 2012  . ARTHROSCOPY KNEE W/ DRILLING  bilateral   2012  . COLONOSCOPY     x2  . FOOT ARTHRODESIS  06/23/2012   Procedure: ARTHRODESIS FOOT;  Surgeon: Wylene Simmer, MD;  Location: [facility];  Service: Orthopedics;  Laterality: Left;  Left Subtalar and Talonavicular Joint Revision Arthrodesis  Aspiration of Bone Marrow from Left Hip   . HAIR TRANSPLANT    . HARDWARE REMOVAL  06/23/2012   Procedure: HARDWARE REMOVAL;  Surgeon: Wylene Simmer, MD;  Location: [facility];  Service: Orthopedics;  Laterality: Left;  Removal of Deep Implant  X's 3  . INSERTION OF MESH N/A 07/10/2016   Procedure: INSERTION OF MESH;  Surgeon: Coralie Keens, MD;  Location: [facility];  Service: General;  Laterality: N/A;  . JOINT REPLACEMENT     right hip  01-2011  . LIMB SPARING RESECTION HIP W/ SADDLE JOINT REPLACEMENT Right   . TOTAL KNEE ARTHROPLASTY Right 01/19/2014   Procedure: RIGHT TOTAL KNEE ARTHROPLASTY, Steroid injection left knee;  Surgeon: Mcarthur Rossetti, MD;  Location: [facility];  Service: Orthopedics;  Laterality: Right;  . TOTAL KNEE ARTHROPLASTY Left 10/23/2014   Procedure: LEFT TOTAL KNEE ARTHROPLASTY;  Surgeon: Mcarthur Rossetti, MD;  Location: [facility];  Service: Orthopedics;  Laterality: Left;  . UMBILICAL HERNIA REPAIR N/A 07/10/2016   Procedure: UMBILICAL HERNIA REPAIR;  Surgeon: Coralie Keens, MD;  Location: [facility];  Service: General;  Laterality: N/A;   Social History   Occupational History  . Not on file  Tobacco Use  . Smoking status: Never Smoker  . Smokeless tobacco: Never Used  Substance and Sexual Activity  . Alcohol use: Yes    Comment: 1 drink daily  . Drug use: No  . Sexual activity: Yes

## 2018-07-27 DIAGNOSIS — M79604 Pain in right leg: Secondary | ICD-10-CM | POA: Diagnosis not present

## 2018-08-03 DIAGNOSIS — M79604 Pain in right leg: Secondary | ICD-10-CM | POA: Diagnosis not present

## 2018-08-09 DIAGNOSIS — M545 Low back pain: Secondary | ICD-10-CM | POA: Diagnosis not present

## 2018-08-09 DIAGNOSIS — I10 Essential (primary) hypertension: Secondary | ICD-10-CM | POA: Diagnosis not present

## 2018-08-10 DIAGNOSIS — M47816 Spondylosis without myelopathy or radiculopathy, lumbar region: Secondary | ICD-10-CM | POA: Diagnosis not present

## 2018-08-30 DIAGNOSIS — M545 Low back pain: Secondary | ICD-10-CM | POA: Diagnosis not present

## 2018-08-30 DIAGNOSIS — I10 Essential (primary) hypertension: Secondary | ICD-10-CM | POA: Diagnosis not present

## 2018-09-20 DIAGNOSIS — M2548 Effusion, other site: Secondary | ICD-10-CM | POA: Diagnosis not present

## 2018-09-20 DIAGNOSIS — M47817 Spondylosis without myelopathy or radiculopathy, lumbosacral region: Secondary | ICD-10-CM | POA: Diagnosis not present

## 2018-09-20 DIAGNOSIS — M5126 Other intervertebral disc displacement, lumbar region: Secondary | ICD-10-CM | POA: Diagnosis not present

## 2018-09-20 DIAGNOSIS — M47816 Spondylosis without myelopathy or radiculopathy, lumbar region: Secondary | ICD-10-CM | POA: Diagnosis not present

## 2018-09-28 DIAGNOSIS — R000 Tachycardia, unspecified: Secondary | ICD-10-CM | POA: Diagnosis not present

## 2018-09-28 DIAGNOSIS — J069 Acute upper respiratory infection, unspecified: Secondary | ICD-10-CM | POA: Diagnosis not present

## 2018-09-28 DIAGNOSIS — M545 Low back pain: Secondary | ICD-10-CM | POA: Diagnosis not present

## 2018-09-28 DIAGNOSIS — I10 Essential (primary) hypertension: Secondary | ICD-10-CM | POA: Diagnosis not present

## 2018-10-19 DIAGNOSIS — M48061 Spinal stenosis, lumbar region without neurogenic claudication: Secondary | ICD-10-CM | POA: Diagnosis not present

## 2018-10-19 DIAGNOSIS — M5416 Radiculopathy, lumbar region: Secondary | ICD-10-CM | POA: Diagnosis not present

## 2018-10-19 DIAGNOSIS — M5136 Other intervertebral disc degeneration, lumbar region: Secondary | ICD-10-CM | POA: Diagnosis not present

## 2018-10-19 DIAGNOSIS — M7138 Other bursal cyst, other site: Secondary | ICD-10-CM | POA: Diagnosis not present

## 2018-10-19 DIAGNOSIS — I10 Essential (primary) hypertension: Secondary | ICD-10-CM | POA: Diagnosis not present

## 2018-10-24 DIAGNOSIS — M47816 Spondylosis without myelopathy or radiculopathy, lumbar region: Secondary | ICD-10-CM | POA: Diagnosis not present

## 2018-10-24 DIAGNOSIS — M5136 Other intervertebral disc degeneration, lumbar region: Secondary | ICD-10-CM | POA: Diagnosis not present

## 2018-11-04 ENCOUNTER — Telehealth: Payer: Self-pay | Admitting: Cardiovascular Disease

## 2018-11-04 DIAGNOSIS — R918 Other nonspecific abnormal finding of lung field: Secondary | ICD-10-CM | POA: Diagnosis not present

## 2018-11-04 DIAGNOSIS — R000 Tachycardia, unspecified: Secondary | ICD-10-CM | POA: Diagnosis not present

## 2018-11-04 DIAGNOSIS — R600 Localized edema: Secondary | ICD-10-CM | POA: Diagnosis not present

## 2018-11-04 DIAGNOSIS — R0602 Shortness of breath: Secondary | ICD-10-CM | POA: Diagnosis not present

## 2018-11-04 DIAGNOSIS — R635 Abnormal weight gain: Secondary | ICD-10-CM | POA: Diagnosis not present

## 2018-11-04 DIAGNOSIS — J90 Pleural effusion, not elsewhere classified: Secondary | ICD-10-CM | POA: Diagnosis not present

## 2018-11-04 DIAGNOSIS — I4510 Unspecified right bundle-branch block: Secondary | ICD-10-CM | POA: Diagnosis not present

## 2018-11-04 NOTE — Telephone Encounter (Signed)
New Message          The P.A. is calling today from [facility]) asking for the patient's last EKG, last seen 02/2016 from what I seen.Their fax # [phone]. They are asking for this ASAP because they think the patient is in heart failure. Pls call and advise.

## 2018-11-04 NOTE — Telephone Encounter (Signed)
Returned call to [employer], [place] @ [facility]. Was told that everyone was out to lunch. rqst for records (EKG) sent to our medical records dept. They are to call back if further assistance is needed.

## 2018-11-11 DIAGNOSIS — R000 Tachycardia, unspecified: Secondary | ICD-10-CM | POA: Diagnosis not present

## 2018-11-11 DIAGNOSIS — R600 Localized edema: Secondary | ICD-10-CM | POA: Diagnosis not present

## 2018-11-24 DIAGNOSIS — R000 Tachycardia, unspecified: Secondary | ICD-10-CM | POA: Diagnosis not present

## 2018-11-24 DIAGNOSIS — R600 Localized edema: Secondary | ICD-10-CM | POA: Diagnosis not present

## 2018-11-24 DIAGNOSIS — R0602 Shortness of breath: Secondary | ICD-10-CM | POA: Diagnosis not present

## 2018-11-24 DIAGNOSIS — I10 Essential (primary) hypertension: Secondary | ICD-10-CM | POA: Diagnosis not present

## 2018-12-02 ENCOUNTER — Encounter: Payer: Self-pay | Admitting: Cardiovascular Disease

## 2018-12-02 ENCOUNTER — Ambulatory Visit: Payer: BLUE CROSS/BLUE SHIELD | Admitting: Cardiovascular Disease

## 2018-12-02 DIAGNOSIS — I340 Nonrheumatic mitral (valve) insufficiency: Secondary | ICD-10-CM | POA: Diagnosis not present

## 2018-12-02 DIAGNOSIS — I4819 Other persistent atrial fibrillation: Secondary | ICD-10-CM | POA: Diagnosis not present

## 2018-12-02 DIAGNOSIS — I10 Essential (primary) hypertension: Secondary | ICD-10-CM | POA: Diagnosis not present

## 2018-12-02 HISTORY — DX: Other persistent atrial fibrillation: I48.19

## 2018-12-02 MED ORDER — METOPROLOL TARTRATE 25 MG PO TABS: 25.0000 mg | ORAL_TABLET | Freq: Two times a day (BID) | ORAL | 3 refills | Status: DC

## 2018-12-02 MED ORDER — APIXABAN 5 MG PO TABS: 5.0000 mg | ORAL_TABLET | Freq: Two times a day (BID) | ORAL | 3 refills | Status: DC

## 2018-12-02 NOTE — Assessment & Plan Note (Signed)
Granja has noticed increased heart rate for the last 4 to 6 weeks.  This is skilled when stent was starting what sounds like steroids for back pain.  He did gain quite a bit of weight which is coming off with oral diuretics.  He still has 1-2+ pitting edema.  He is noticed his heart rate at home is been in the 130 range.  Today he is in A. fib with a heart rate of 116, right bundle branch block and left axis deviation.  I am going to repeat a 2D echo, rate control him and begin him on an oral anticoagulant/Eliquis.  I will see him back in 4 weeks which time we will entertain outpatient DC cardioversion.

## 2018-12-02 NOTE — Patient Instructions (Addendum)
Medication Instructions:  START CARVEDILOL 25 MG, ONE TABLET TWICE A DAY START ELIQUIS 5 MG, ONE TABLET TWICE A DAY If you need a refill on your cardiac medications before your next appointment, please call your pharmacy.   Lab work: NONE If you have labs (blood work) drawn today and your tests are completely normal, you will receive your results only by: Marland Kitchen MyChart Message (if you have MyChart) OR . A paper copy in the mail If you have any lab test that is abnormal or we need to change your treatment, we will call you to review the results.  Testing/Procedures: Your physician has requested that you have an echocardiogram. Echocardiography is a painless test that uses sound waves to create images of your heart. It provides your doctor with information about the size and shape of your heart and how well your heart's chambers and valves are working. This procedure takes approximately one hour. There are no restrictions for this procedure.   Follow-Up: At [facility], you and your health needs are our priority.  As part of our continuing mission to provide you with exceptional heart care, we have created designated Provider Care Teams.  These Care Teams include your primary Cardiologist (physician) and Advanced Practice Providers (APPs -  Physician Assistants and Nurse Practitioners) who all work together to provide you with the care you need, when you need it. You will need a follow up appointment in 4 weeks.  You may see Dr. Gwenlyn Found.  Any Other Special Instructions Will Be Listed Below (If Applicable). FOLLOW UP WITH CLINICAL PHARMACIST 2 WEEKS AFTER STARTING YOUR NEW MEDICATIONS FOR MEDICATION TITRATION

## 2018-12-02 NOTE — Progress Notes (Signed)
12/02/2018 [name]   1955-12-22  [id number]  Primary Physician Douglas Limbo, MD Primary Cardiologist: Lorretta Harp MD [name], [place]  HPI:  [name] is a 63 y.o.  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 He was referred by Dr. Coletta Ewing for cardiovascular evaluation because of hypertension and mitral regurgitation.  I last saw him in the office 02/07/2016 his only cardiac risk factor is treated hypertension. He does not smoke. He's never had a heart attack or stroke. He is on 3 antihypertensive medications and is aware of some restriction. His last 2-D echocardiogram performed 11/25/12 revealed mildly dilated left ventricle with normal systolic function, moderate to severe MR and severe left atrial enlargement. He remains in sinus rhythm and otherwise is asymptomatic.  As I saw him back 3 years ago he has had some back pain and has been placed on steroids which apparently resulted in significant weight gain which is slowly coming off with oral diuretics.  He is also noticed that his heart rate is been elevated for last 4 to 6 weeks in the 130 range.  Today is in A. fib with RVR.  He does complain of some increasing shortness of breath as well.  No outpatient medications have been marked as taking for the 12/02/18 encounter (Office Visit) with Lorretta Harp, MD.     No Known Allergies  Social History   Socioeconomic History  . Marital status: Married    Spouse name: Not on file  . Number of children: Not on file  . Years of education: Not on file  . Highest education level: Not on file  Occupational History  . Not on file  Social Needs  . Financial resource strain: Not on file  . Food insecurity:    Worry: Not on file    Inability: Not on file  . Transportation needs:    Medical: Not on file    Non-medical: Not on file  Tobacco Use  . Smoking status: Never Smoker  .  Smokeless tobacco: Never Used  Substance and Sexual Activity  . Alcohol use: Yes    Comment: 1 drink daily  . Drug use: No  . Sexual activity: Yes  Lifestyle  . Physical activity:    Days per week: Not on file    Minutes per session: Not on file  . Stress: Not on file  Relationships  . Social connections:    Talks on phone: Not on file    Gets together: Not on file    Attends religious service: Not on file    Active member of club or organization: Not on file    Attends meetings of clubs or organizations: Not on file    Relationship status: Not on file  . Intimate partner violence:    Fear of current or ex partner: Not on file    Emotionally abused: Not on file    Physically abused: Not on file    Forced sexual activity: Not on file  Other Topics Concern  . Not on file  Social History Narrative  . Not on file     Review of Systems: General: negative for chills, fever, night sweats or weight changes.  Cardiovascular: negative for chest pain, dyspnea on exertion, edema, orthopnea, palpitations, paroxysmal nocturnal dyspnea or shortness of breath Dermatological: negative for rash Respiratory: negative for cough or wheezing Urologic: negative for hematuria Abdominal: negative for  nausea, vomiting, diarrhea, bright red blood per rectum, melena, or hematemesis Neurologic: negative for visual changes, syncope, or dizziness All other systems reviewed and are otherwise negative except as noted above.    Blood pressure 122/86, pulse (!) 116, height 6\' 3"  (1.905 m), weight 265 lb 3.2 oz (120.3 kg).  General appearance: alert and no distress Neck: no adenopathy, no carotid bruit, no JVD, supple, symmetrical, trachea midline and thyroid not enlarged, symmetric, no tenderness/mass/nodules Lungs: clear to auscultation bilaterally Heart: irregularly irregular rhythm Extremities: 2+ pitting edema bilaterally.  Patient is wearing compression stockings Pulses: 2+ and symmetric Skin:  Skin color, texture, turgor normal. No rashes or lesions Neurologic: Alert and oriented X 3, normal strength and tone. Normal symmetric reflexes. Normal coordination and gait  EKG atrial fibrillation with a ventricular spots of 116, right bundle branch block and left axis deviation.  I personally reviewed this EKG.  ASSESSMENT AND PLAN:   Essential hypertension History essentially potential blood pressure measured today 122/86.  It is unclear what antihypertensive medications he is on today because he did not bring his home list.  Mitral regurgitation History of mitral valve prolapse with moderate MR and moderate left atrial enlargement by recent 2D echo performed 04/27/2018.  Will repeat a 2D echocardiogram.  Persistent atrial fibrillation [name] has noticed increased heart rate for the last 4 to 6 weeks.  This is skilled when stent was starting what sounds like steroids for back pain.  He did gain quite a bit of weight which is coming off with oral diuretics.  He still has 1-2+ pitting edema.  He is noticed his heart rate at home is been in the 130 range.  Today he is in A. fib with a heart rate of 116, right bundle branch block and left axis deviation.  I am going to repeat a 2D echo, rate control him and begin him on an oral anticoagulant/Eliquis.  I will see him back in 4 weeks which time we will entertain outpatient DC cardioversion.      Lorretta Harp MD FACP,FACC,FAHA, [facility] 12/02/2018 12:54 PM

## 2018-12-02 NOTE — Addendum Note (Signed)
Addended by: Annita Brod on: 12/02/2018 01:19 PM   Modules accepted: Orders

## 2018-12-02 NOTE — Assessment & Plan Note (Signed)
History of mitral valve prolapse with moderate MR and moderate left atrial enlargement by recent 2D echo performed 04/27/2018.  Will repeat a 2D echocardiogram.

## 2018-12-02 NOTE — Assessment & Plan Note (Signed)
History essentially potential blood pressure measured today 122/86.  It is unclear what antihypertensive medications he is on today because he did not bring his home list.

## 2018-12-06 ENCOUNTER — Telehealth: Payer: Self-pay | Admitting: Cardiovascular Disease

## 2018-12-06 NOTE — Telephone Encounter (Signed)
  Patient needs to update his medication list. He said there are some he no longer takes and some that he does take that are not on the list

## 2018-12-06 NOTE — Telephone Encounter (Signed)
Called patient, spoke regarding his medication list. Patient advised of medications he was no longer taking, and had started. Medication list is up to date.

## 2018-12-08 NOTE — Addendum Note (Signed)
Addended by: Zebedee Iba on: 12/08/2018 10:33 AM   Modules accepted: Orders

## 2018-12-09 ENCOUNTER — Ambulatory Visit (HOSPITAL_COMMUNITY): Payer: BLUE CROSS/BLUE SHIELD | Attending: Cardiology

## 2018-12-09 DIAGNOSIS — I340 Nonrheumatic mitral (valve) insufficiency: Secondary | ICD-10-CM | POA: Insufficient documentation

## 2018-12-16 ENCOUNTER — Ambulatory Visit (INDEPENDENT_AMBULATORY_CARE_PROVIDER_SITE_OTHER): Payer: BLUE CROSS/BLUE SHIELD | Admitting: Pharmacist Clinician (PhC)/ Clinical Pharmacy Specialist

## 2018-12-16 DIAGNOSIS — I4819 Other persistent atrial fibrillation: Secondary | ICD-10-CM | POA: Diagnosis not present

## 2018-12-16 MED ORDER — METOPROLOL TARTRATE 50 MG PO TABS: 50.0000 mg | ORAL_TABLET | Freq: Two times a day (BID) | ORAL | 3 refills | Status: DC

## 2018-12-16 NOTE — Progress Notes (Signed)
12/16/2018 BALEY LORIMER 05/22/1956 [id number]   HPI:  [name] is a 63 y.o. male patient of Dr Gwenlyn Found, with a PMH below who presents today for medication review and titration.  He saw Dr. Gwenlyn Found about 2 weeks ago and was noted to be in AFib with a heart rate of 116.  In addition to this, his medical history is significant for hypertension and mitral regurgitation.     When he saw Dr. Gwenlyn Found, he was started on metoprolol and Eliquis.  Unfortunately he did not know any of his home medications, so we are not sure which other medications he might be taking.  He is here today for potential titration of the beta blocker, but as his last blood pressure was at 122/86, we need to know which medications he actually takes.    Today patient returns with information to update his medication list.  He notes that in the past week or two he is easily fatigued.  This morning he mopped the kitchen floor, but had to stop after 3-4 minutes because he was tired.  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  Other than frustration with the fatigue he has no concerns.    Blood Pressure Goal:  130/80  Current Medications:  Medication list reviewed, patient brought in updated information.   Home BP readings:  Does not recall blood pressure readings except that they are normal.  HR still elevated; checks at local fire station  Intolerances:   nkda  Labs:  11/24/2018:  Na 139, K 4.3, Glu 95, BUN 24, SCr 1.12  Wt Readings from Last 3 Encounters:  12/02/18 265 lb 3.2 oz (120.3 kg)  09/17/17 262 lb (118.8 kg)  09/03/17 262 lb (118.8 kg)   BP Readings from Last 3 Encounters:  12/16/18 112/84  12/02/18 122/86  09/17/17 130/81   Pulse Readings from Last 3 Encounters:  12/16/18 98  12/02/18 (!) 116  09/17/17 (!) 52    Current Outpatient Medications  Medication Sig Dispense Refill  . ALPRAZolam  (XANAX) 0.5 MG tablet Take 0.5 mg by mouth at bedtime as needed for anxiety.    Marland Kitchen apixaban (ELIQUIS) 5 MG TABS tablet Take 1 tablet (5 mg total) by mouth 2 (two) times daily. 180 tablet 3  . CARTIA XT 240 MG 24 hr capsule Take 240 mg by mouth daily.    Mariane Baumgarten Sodium (COLACE PO) Take by mouth. Over the counter    . doxazosin (CARDURA) 4 MG tablet Take 4 mg by mouth at bedtime.     Marland Kitchen lisinopril (PRINIVIL,ZESTRIL) 20 MG tablet Take 20 mg by mouth every morning.     . Multiple Vitamin (MULTIVITAMIN) LIQD Take 5 mLs by mouth daily.    . naproxen sodium (ANAPROX) 220 MG tablet Take 220 mg by mouth 2 (two) times daily with a meal.    . OVER THE COUNTER MEDICATION Sleep aid - diphenhydramine    . metoprolol tartrate (LOPRESSOR) 50 MG tablet Take 1 tablet (50 mg total) by mouth 2 (two) times daily. 180 tablet 3   No current facility-administered medications for this visit.     No Known Allergies  Past Medical History:  Diagnosis Date  . Allergy   . Anxiety   . Arthritis    knees--S/P RIGHT TOTAL KNEE REPLACED 01/19/14   . GERD (gastroesophageal reflux  disease)    hx of, not freq  WOULD USE TUMS IF NEEDED  . Heart murmur    PT STATES HIS MEDICAL DOCTOR MONITORING HIS MURMUR - NOT CAUSING PT ANY PROBLEMS- STATES HE WAS NOT AWARE OF MURMUR UNTIL PHYSICAL 1014  . History of colon polyps   . Hypertension    takes Cardura and NOrvasc daily  . Insomnia   . Mitral valve regurgitation    moderate to severe  . Seizures ([place])    had one approx. 30 yrs ago,has not had any since    Blood pressure 112/84, pulse 98.  Persistent atrial fibrillation Patient still in AF with RVR and feeling fatigued.  Spent 30 minutes in reviewing medications and their purpose, definition of AF and why we need to anticoagulate for one month prior to DCCV.  Answered all of patient questions. BP is good today, patient not having any orthostatic symptoms.  Will have him decrease the lisinopril to 10 mg daily (cut his 20  mg tabs in half for now) and increase the metoprolol tartrate to 50 mg twice daily.  He was encouraged to continue monitoring his BP and he will follow up with Dr. Gwenlyn Found next week.     Tommy Medal PharmD CPP [facility] [address] [place], [place] [address] [phone]

## 2018-12-16 NOTE — Patient Instructions (Signed)
Check your blood pressure at home daily (as able) and keep record of the readings.  If you note your blood pressure go below 100/60 please give [place] a call.  Kristin/Raquel at [phone]  Take your BP meds as follows:  Cut the lisinopril to 10 mg (1/2 tablet) once daily  Increase the metoprolol tartrate to 50 mg (2 tablets) twice daily    Continue with all other medications  Bring all of your meds, your BP cuff and your record of home blood pressures to your next appointment.  Exercise as you're able, try to walk approximately 30 minutes per day.  Keep salt intake to a minimum, especially watch canned and prepared boxed foods.  Eat more fresh fruits and vegetables and fewer canned items.  Avoid eating in fast food restaurants.    HOW TO TAKE YOUR BLOOD PRESSURE: . Rest 5 minutes before taking your blood pressure. .  Don't smoke or drink caffeinated beverages for at least 30 minutes before. . Take your blood pressure before (not after) you eat. . Sit comfortably with your back supported and both feet on the floor (don't cross your legs). . Elevate your arm to heart level on a table or a desk. . Use the proper sized cuff. It should fit smoothly and snugly around your bare upper arm. There should be enough room to slip a fingertip under the cuff. The bottom edge of the cuff should be 1 inch above the crease of the elbow. . Ideally, take 3 measurements at one sitting and record the average.

## 2018-12-16 NOTE — Assessment & Plan Note (Signed)
Patient still in AF with RVR and feeling fatigued.  Spent 30 minutes in reviewing medications and their purpose, definition of AF and why we need to anticoagulate for one month prior to DCCV.  Answered all of patient questions. BP is good today, patient not having any orthostatic symptoms.  Will have him decrease the lisinopril to 10 mg daily (cut his 20 mg tabs in half for now) and increase the metoprolol tartrate to 50 mg twice daily.  He was encouraged to continue monitoring his BP and he will follow up with Dr. Gwenlyn Found next week.

## 2018-12-20 ENCOUNTER — Telehealth: Payer: Self-pay | Admitting: Cardiovascular Disease

## 2018-12-20 ENCOUNTER — Other Ambulatory Visit: Payer: Self-pay | Admitting: *Deleted

## 2018-12-20 MED ORDER — POTASSIUM CHLORIDE ER 10 MEQ PO TBCR: 10.0000 meq | EXTENDED_RELEASE_TABLET | Freq: Two times a day (BID) | ORAL | 5 refills | Status: DC

## 2018-12-20 MED ORDER — FUROSEMIDE 40 MG PO TABS: 40.0000 mg | ORAL_TABLET | Freq: Every day | ORAL | 5 refills | Status: DC

## 2018-12-20 NOTE — Telephone Encounter (Signed)
Spoke with patient and he stopped his Lasix 40 mg and Potassium Chloride 10 meq twice a day last week, stated he did not think that Lasix was working. He has gained 7 pounds since Friday, swelling in legs/feet, and short of breath lying down. Discussed with Doreene Burke PA and will have patient resume Lasix 40 mg daily and KCL 10 meq twice a day, go to ED if worse/no better or call the office if during business hours. BMET next week. Spoke with patient and gave recommendations. Keep appointment 10/30/19 with Dr Gwenlyn Found. Did advise needs to start meds ASAP and go to ED if worse or no improvement, verbalized understanding.

## 2018-12-20 NOTE — Telephone Encounter (Signed)
Pt c/o swelling: STAT is pt has developed SOB within 24 hours yes  1) How much weight have you gained and in what time span? 7 lbs since Friday  2) If swelling, where is the swelling located? Feet,legs, stomach  3) Are you currently taking a fluid pill? No. Pt has some but has not taken them. He said they stopped working   4) Are you currently SOB? Yes   5) Do you have a log of your daily weights (if so, list)?   6) Have you gained 3 pounds in a day or 5 pounds in a week? Yes. 7 lbs in 4 days  7) Have you traveled recently? no

## 2018-12-26 ENCOUNTER — Telehealth: Payer: Self-pay | Admitting: Cardiovascular Disease

## 2018-12-26 DIAGNOSIS — Z7189 Other specified counseling: Secondary | ICD-10-CM

## 2018-12-26 NOTE — Telephone Encounter (Signed)
° ° °  SOB at night, no energy  Pt c/o swelling: STAT is pt has developed SOB within 24 hours  1) How much weight have you gained and in what time span? n/a  2) If swelling, where is the swelling located? Legs, stomach  3) Are you currently taking a fluid pill? yes  4) Are you currently SOB? SOB at night  5) Do you have a log of your daily weights (if so, list)? 262 today  6) Have you gained 3 pounds in a day or 5 pounds in a week? n/a  7) Have you traveled recently? no

## 2018-12-26 NOTE — Telephone Encounter (Signed)
Pt states that he has been taking lasix 40 mg as prescribed since last week after being advised to resume doing so by one of the triage nurses. He still has c/o dyspnea when lying down, edema to BLE and abdomen. Informed pt that message would be routed to Dr. Gwenlyn Found for review. Advised pt to continue taking Lasix as prescribed, report for BMET lab this week per previous telephone note 12/20/2018; and keep 12/30/2018 appt with Dr. Gwenlyn Found. Advised pt to report to ED if symptoms worsen. Pt verbalized understanding

## 2018-12-28 DIAGNOSIS — Z7189 Other specified counseling: Secondary | ICD-10-CM | POA: Diagnosis not present

## 2018-12-28 LAB — BASIC METABOLIC PANEL
BUN/Creatinine Ratio: 29 — ABNORMAL HIGH (ref 10–24)
BUN: 35 mg/dL — ABNORMAL HIGH (ref 8–27)
CO2: 32 mmol/L — ABNORMAL HIGH (ref 20–29)
Calcium: 9 mg/dL (ref 8.6–10.2)
Chloride: 100 mmol/L (ref 96–106)
Creatinine, Ser: 1.19 mg/dL (ref 0.76–1.27)
GFR calc Af Amer: 75 mL/min/{1.73_m2} (ref >59)
GFR calc non Af Amer: 65 mL/min/{1.73_m2} (ref >59)
Glucose: 116 mg/dL — ABNORMAL HIGH (ref 65–99)
Potassium: 4.9 mmol/L (ref 3.5–5.2)
Sodium: 138 mmol/L (ref 134–144)

## 2018-12-30 ENCOUNTER — Emergency Department (HOSPITAL_COMMUNITY): Payer: BLUE CROSS/BLUE SHIELD

## 2018-12-30 ENCOUNTER — Other Ambulatory Visit: Payer: Self-pay

## 2018-12-30 ENCOUNTER — Inpatient Hospital Stay (HOSPITAL_COMMUNITY)
Admission: EM | Admit: 2018-12-30 | Discharge: 2019-01-04 | DRG: 308 | Disposition: A | Discharge status: Home / Self Care | Payer: BLUE CROSS/BLUE SHIELD | Source: Ambulatory Visit | Attending: Cardiovascular Disease | Admitting: Cardiovascular Disease

## 2018-12-30 ENCOUNTER — Ambulatory Visit: Payer: BLUE CROSS/BLUE SHIELD | Admitting: Cardiovascular Disease

## 2018-12-30 ENCOUNTER — Encounter (HOSPITAL_COMMUNITY): Payer: Self-pay

## 2018-12-30 ENCOUNTER — Encounter: Payer: Self-pay | Admitting: Cardiovascular Disease

## 2018-12-30 DIAGNOSIS — I160 Hypertensive urgency: Secondary | ICD-10-CM | POA: Diagnosis present

## 2018-12-30 DIAGNOSIS — I340 Nonrheumatic mitral (valve) insufficiency: Secondary | ICD-10-CM | POA: Diagnosis not present

## 2018-12-30 DIAGNOSIS — I484 Atypical atrial flutter: Secondary | ICD-10-CM

## 2018-12-30 DIAGNOSIS — I4891 Unspecified atrial fibrillation: Secondary | ICD-10-CM

## 2018-12-30 DIAGNOSIS — K219 Gastro-esophageal reflux disease without esophagitis: Secondary | ICD-10-CM | POA: Diagnosis not present

## 2018-12-30 DIAGNOSIS — Z23 Encounter for immunization: Secondary | ICD-10-CM | POA: Diagnosis not present

## 2018-12-30 DIAGNOSIS — Z8249 Family history of ischemic heart disease and other diseases of the circulatory system: Secondary | ICD-10-CM

## 2018-12-30 DIAGNOSIS — Z800 Family history of malignant neoplasm of digestive organs: Secondary | ICD-10-CM

## 2018-12-30 DIAGNOSIS — I110 Hypertensive heart disease with heart failure: Secondary | ICD-10-CM | POA: Diagnosis not present

## 2018-12-30 DIAGNOSIS — I341 Nonrheumatic mitral (valve) prolapse: Secondary | ICD-10-CM | POA: Diagnosis present

## 2018-12-30 DIAGNOSIS — Z981 Arthrodesis status: Secondary | ICD-10-CM | POA: Diagnosis not present

## 2018-12-30 DIAGNOSIS — I5022 Chronic systolic (congestive) heart failure: Secondary | ICD-10-CM | POA: Diagnosis not present

## 2018-12-30 DIAGNOSIS — I5041 Acute combined systolic (congestive) and diastolic (congestive) heart failure: Secondary | ICD-10-CM | POA: Diagnosis not present

## 2018-12-30 DIAGNOSIS — I5023 Acute on chronic systolic (congestive) heart failure: Secondary | ICD-10-CM

## 2018-12-30 DIAGNOSIS — J9811 Atelectasis: Secondary | ICD-10-CM | POA: Diagnosis not present

## 2018-12-30 DIAGNOSIS — I371 Nonrheumatic pulmonary valve insufficiency: Secondary | ICD-10-CM | POA: Diagnosis present

## 2018-12-30 DIAGNOSIS — R0602 Shortness of breath: Secondary | ICD-10-CM | POA: Diagnosis not present

## 2018-12-30 DIAGNOSIS — Z833 Family history of diabetes mellitus: Secondary | ICD-10-CM | POA: Diagnosis not present

## 2018-12-30 DIAGNOSIS — I77810 Thoracic aortic ectasia: Secondary | ICD-10-CM | POA: Diagnosis not present

## 2018-12-30 DIAGNOSIS — R000 Tachycardia, unspecified: Secondary | ICD-10-CM

## 2018-12-30 DIAGNOSIS — I420 Dilated cardiomyopathy: Secondary | ICD-10-CM | POA: Diagnosis present

## 2018-12-30 DIAGNOSIS — Z96641 Presence of right artificial hip joint: Secondary | ICD-10-CM | POA: Diagnosis present

## 2018-12-30 DIAGNOSIS — I10 Essential (primary) hypertension: Secondary | ICD-10-CM | POA: Diagnosis not present

## 2018-12-30 DIAGNOSIS — Z96653 Presence of artificial knee joint, bilateral: Secondary | ICD-10-CM | POA: Diagnosis not present

## 2018-12-30 DIAGNOSIS — I4819 Other persistent atrial fibrillation: Secondary | ICD-10-CM

## 2018-12-30 DIAGNOSIS — Z7901 Long term (current) use of anticoagulants: Secondary | ICD-10-CM | POA: Diagnosis not present

## 2018-12-30 DIAGNOSIS — I4892 Unspecified atrial flutter: Secondary | ICD-10-CM | POA: Diagnosis present

## 2018-12-30 DIAGNOSIS — R0609 Other forms of dyspnea: Secondary | ICD-10-CM | POA: Diagnosis not present

## 2018-12-30 DIAGNOSIS — I509 Heart failure, unspecified: Secondary | ICD-10-CM | POA: Diagnosis not present

## 2018-12-30 DIAGNOSIS — I5043 Acute on chronic combined systolic (congestive) and diastolic (congestive) heart failure: Secondary | ICD-10-CM | POA: Diagnosis not present

## 2018-12-30 DIAGNOSIS — I083 Combined rheumatic disorders of mitral, aortic and tricuspid valves: Secondary | ICD-10-CM | POA: Diagnosis not present

## 2018-12-30 DIAGNOSIS — I700 Atherosclerosis of aorta: Secondary | ICD-10-CM | POA: Diagnosis not present

## 2018-12-30 DIAGNOSIS — I5021 Acute systolic (congestive) heart failure: Secondary | ICD-10-CM | POA: Diagnosis not present

## 2018-12-30 HISTORY — DX: Dilated cardiomyopathy: I42.0

## 2018-12-30 HISTORY — DX: Other persistent atrial fibrillation: I48.19

## 2018-12-30 HISTORY — DX: Nonrheumatic mitral (valve) insufficiency: I34.0

## 2018-12-30 HISTORY — DX: Thoracic aortic ectasia: I77.810

## 2018-12-30 HISTORY — DX: Chronic systolic (congestive) heart failure: I50.22

## 2018-12-30 HISTORY — DX: Nonrheumatic mitral (valve) prolapse: I34.1

## 2018-12-30 HISTORY — DX: Acute on chronic systolic (congestive) heart failure: I50.23

## 2018-12-30 HISTORY — DX: Unspecified atrial flutter: I48.92

## 2018-12-30 LAB — TROPONIN I: Troponin I: <0.03 ng/mL (ref <0.03)

## 2018-12-30 LAB — CBC
HCT: 42.7 % (ref 39.0–52.0)
Hemoglobin: 13 g/dL (ref 13.0–17.0)
MCH: 27.7 pg (ref 26.0–34.0)
MCHC: 30.4 g/dL (ref 30.0–36.0)
MCV: 91 fL (ref 80.0–100.0)
Platelets: 150 10*3/uL (ref 150–400)
RBC: 4.69 MIL/uL (ref 4.22–5.81)
RDW: 16.9 % — ABNORMAL HIGH (ref 11.5–15.5)
WBC: 6.4 10*3/uL (ref 4.0–10.5)
nRBC: 0 % (ref 0.0–0.2)

## 2018-12-30 LAB — BASIC METABOLIC PANEL
Anion gap: 13 (ref 5–15)
BUN: 31 mg/dL — AB (ref 8–23)
CO2: 21 mmol/L — ABNORMAL LOW (ref 22–32)
Calcium: 9 mg/dL (ref 8.9–10.3)
Chloride: 102 mmol/L (ref 98–111)
Creatinine, Ser: 1.12 mg/dL (ref 0.61–1.24)
GFR calc Af Amer: >60 mL/min (ref >60)
Glucose, Bld: 130 mg/dL — ABNORMAL HIGH (ref 70–99)
POTASSIUM: 4 mmol/L (ref 3.5–5.1)
Sodium: 136 mmol/L (ref 135–145)

## 2018-12-30 LAB — BRAIN NATRIURETIC PEPTIDE: B Natriuretic Peptide: 1365.8 pg/mL — ABNORMAL HIGH (ref 0.0–100.0)

## 2018-12-30 MED ORDER — FUROSEMIDE 10 MG/ML IJ SOLN
40.0000 mg | Freq: Two times a day (BID) | INTRAMUSCULAR | Status: DC
  Administered 2018-12-30 – 2018-12-31 (×3): 40 mg via INTRAVENOUS
  Filled 2018-12-30 (×3): qty 4

## 2018-12-30 MED ORDER — ALPRAZOLAM 0.5 MG PO TABS
0.5000 mg | ORAL_TABLET | Freq: Every evening | ORAL | Status: DC | PRN
  Administered 2018-12-31 – 2019-01-03 (×4): 0.5 mg via ORAL
  Filled 2018-12-30: qty 1
  Filled 2018-12-30 (×3): qty 2

## 2018-12-30 MED ORDER — METOPROLOL TARTRATE 100 MG PO TABS
100.0000 mg | ORAL_TABLET | Freq: Two times a day (BID) | ORAL | Status: DC
  Administered 2018-12-30 – 2019-01-04 (×10): 100 mg via ORAL
  Filled 2018-12-30 (×2): qty 4
  Filled 2018-12-30: qty 1
  Filled 2018-12-30: qty 4
  Filled 2018-12-30 (×2): qty 1
  Filled 2018-12-30 (×5): qty 4

## 2018-12-30 MED ORDER — POTASSIUM CHLORIDE CRYS ER 10 MEQ PO TBCR
10.0000 meq | EXTENDED_RELEASE_TABLET | Freq: Two times a day (BID) | ORAL | Status: DC
  Administered 2018-12-30 – 2019-01-04 (×10): 10 meq via ORAL
  Filled 2018-12-30 (×14): qty 1

## 2018-12-30 MED ORDER — APIXABAN 5 MG PO TABS
5.0000 mg | ORAL_TABLET | Freq: Two times a day (BID) | ORAL | Status: DC
  Administered 2018-12-30 – 2019-01-04 (×10): 5 mg via ORAL
  Filled 2018-12-30 (×10): qty 1

## 2018-12-30 MED ORDER — DOXAZOSIN MESYLATE 8 MG PO TABS
4.0000 mg | ORAL_TABLET | Freq: Every day | ORAL | Status: DC
  Administered 2018-12-30 – 2019-01-03 (×5): 4 mg via ORAL
  Filled 2018-12-30 (×5): qty 1

## 2018-12-30 MED ORDER — FUROSEMIDE 10 MG/ML IJ SOLN
40.0000 mg | Freq: Once | INTRAMUSCULAR | Status: AC
  Administered 2018-12-30: 40 mg via INTRAVENOUS
  Filled 2018-12-30: qty 4

## 2018-12-30 MED ORDER — SACUBITRIL-VALSARTAN 24-26 MG PO TABS
1.0000 | ORAL_TABLET | Freq: Two times a day (BID) | ORAL | Status: DC
  Administered 2019-01-01 – 2019-01-03 (×5): 1 via ORAL
  Filled 2018-12-30 (×6): qty 1

## 2018-12-30 MED ORDER — METOPROLOL TARTRATE 5 MG/5ML IV SOLN
5.0000 mg | Freq: Once | INTRAVENOUS | Status: AC
  Administered 2018-12-30: 5 mg via INTRAVENOUS
  Filled 2018-12-30: qty 5

## 2018-12-30 MED ORDER — INFLUENZA VAC SPLIT QUAD 0.5 ML IM SUSY
0.5000 mL | PREFILLED_SYRINGE | INTRAMUSCULAR | Status: AC
  Administered 2019-01-01: 0.5 mL via INTRAMUSCULAR
  Filled 2018-12-30: qty 0.5

## 2018-12-30 NOTE — Assessment & Plan Note (Signed)
Mr. Hallam has had progressive weight gain and dyspnea on increasing doses of diuretics which do not seem to be effective.  He is gained over 20 pounds since the end of January.  His follow-up echo performed 12/09/2018 revealed severe LV dysfunction with an EF down to 35 to 40% range with cavity dilatation and reduced right ventricular function possible flail segment of the posterior mitral valve leaflet and moderate MR.  This is significantly different than his echo performed 04/27/2018 when his heart was normal in size and function although he did have severe prolapse at that time with moderate MR as well.  He will need to be admitted for IV diuresis and cardioversion.

## 2018-12-30 NOTE — Assessment & Plan Note (Signed)
History of essential hypertension her blood pressure measured today 120/82.  He is on Cartia XT 240 and metoprolol as well as lisinopril.

## 2018-12-30 NOTE — ED Notes (Signed)
Paged cards to place admit orders. No call back at this time

## 2018-12-30 NOTE — Progress Notes (Signed)
12/30/2018 [name]   04-28-1956  [id number]  Primary Physician [name], MD Primary Cardiologist: Lorretta Harp MD Lupe Carney, [place]  HPI:  [name] is a 63 y.o.  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 He was referred by Dr. Coletta Memos for cardiovascular evaluation because of hypertension and mitral regurgitation.  I last saw him in the office 12/02/2018 his only cardiac risk factor is treated hypertension. He does not smoke. He's never had a heart attack or stroke. He is on 3 antihypertensive medications and is aware of some restriction. His last 2-D echocardiogram performed 11/25/12 revealed mildly dilated left ventricle with normal systolic function, moderate to severe MR and severe left atrial enlargement. He remains in sinus rhythm and otherwise is asymptomatic.  When I saw him a month ago he was in A. fib with RVR.  I did place him on Eliquis with the intent to cardiovert him and increase his beta-blocker.  His heart rate did come down to the 70s.  His weight however is increased 20 pounds over the last month despite increasing dose of diuretics.  He is more dyspneic.   Current Meds  Medication Sig  . ALPRAZolam (XANAX) 0.5 MG tablet Take 0.5 mg by mouth at bedtime as needed for anxiety.  Marland Kitchen apixaban (ELIQUIS) 5 MG TABS tablet Take 1 tablet (5 mg total) by mouth 2 (two) times daily.  Marland Kitchen CARTIA XT 240 MG 24 hr capsule Take 240 mg by mouth daily.  Mariane Baumgarten Sodium (COLACE PO) Take by mouth. Over the counter  . doxazosin (CARDURA) 4 MG tablet Take 4 mg by mouth at bedtime.   Marland Kitchen lisinopril (PRINIVIL,ZESTRIL) 20 MG tablet Take 10 mg by mouth every morning.   . metoprolol tartrate (LOPRESSOR) 50 MG tablet Take 1 tablet (50 mg total) by mouth 2 (two) times daily.  . Multiple Vitamin (MULTIVITAMIN) LIQD Take 5 mLs by mouth daily.  . naproxen sodium (ANAPROX) 220 MG tablet Take 220 mg by  mouth 2 (two) times daily with a meal.  . OVER THE COUNTER MEDICATION Sleep aid - diphenhydramine  . potassium chloride (K-DUR) 10 MEQ tablet Take 1 tablet (10 mEq total) by mouth 2 (two) times daily.     No Known Allergies  Social History   Socioeconomic History  . Marital status: Married    Spouse name: Not on file  . Number of children: Not on file  . Years of education: Not on file  . Highest education level: Not on file  Occupational History  . Not on file  Social Needs  . Financial resource strain: Not on file  . Food insecurity:    Worry: Not on file    Inability: Not on file  . Transportation needs:    Medical: Not on file    Non-medical: Not on file  Tobacco Use  . Smoking status: Never Smoker  . Smokeless tobacco: Never Used  Substance and Sexual Activity  . Alcohol use: Yes    Comment: 1 drink daily  . Drug use: No  . Sexual activity: Yes  Lifestyle  . Physical activity:    Days per week: Not on file    Minutes per session: Not on file  . Stress: Not on file  Relationships  . Social connections:    Talks on phone: Not on file    Gets together: Not on file    Attends  religious service: Not on file    Active member of club or organization: Not on file    Attends meetings of clubs or organizations: Not on file    Relationship status: Not on file  . Intimate partner violence:    Fear of current or ex partner: Not on file    Emotionally abused: Not on file    Physically abused: Not on file    Forced sexual activity: Not on file  Other Topics Concern  . Not on file  Social History Narrative  . Not on file     Review of Systems: General: negative for chills, fever, night sweats or weight changes.  Cardiovascular: negative for chest pain, dyspnea on exertion, edema, orthopnea, palpitations, paroxysmal nocturnal dyspnea or shortness of breath Dermatological: negative for rash Respiratory: negative for cough or wheezing Urologic: negative for  hematuria Abdominal: negative for nausea, vomiting, diarrhea, bright red blood per rectum, melena, or hematemesis Neurologic: negative for visual changes, syncope, or dizziness All other systems reviewed and are otherwise negative except as noted above.    Blood pressure 120/82, pulse 77, height 6\' 3"  (1.905 m), weight 287 lb 6.4 oz (130.4 kg), SpO2 96 %.  General appearance: alert and no distress Neck: no adenopathy, no carotid bruit, no JVD, supple, symmetrical, trachea midline and thyroid not enlarged, symmetric, no tenderness/mass/nodules Lungs: clear to auscultation bilaterally Heart: irregularly irregular rhythm Extremities: 2-3+ pitting edema bilaterally Pulses: Normal Skin: Skin color, texture, turgor normal. No rashes or lesions Neurologic: Alert and oriented X 3, normal strength and tone. Normal symmetric reflexes. Normal coordination and gait  EKG not performed today  ASSESSMENT AND PLAN:   Essential hypertension History of essential hypertension her blood pressure measured today 120/82.  He is on Cartia XT 240 and metoprolol as well as lisinopril.  Persistent atrial fibrillation History of persistent A. fib now rate controlled on increased doses of beta-blocker and Cardizem with heart rates in the 70s.  Chronic systolic (congestive) heart failure [facility]) Mr. Lobue has had progressive weight gain and dyspnea on increasing doses of diuretics which do not seem to be effective.  He is gained over 20 pounds since the end of January.  His follow-up echo performed 12/09/2018 revealed severe LV dysfunction with an EF down to 35 to 40% range with cavity dilatation and reduced right ventricular function possible flail segment of the posterior mitral valve leaflet and moderate MR.  This is significantly different than his echo performed 04/27/2018 when his heart was normal in size and function although he did have severe prolapse at that time with moderate MR as well.  He will need to be  admitted for IV diuresis and cardioversion.      Lorretta Harp MD FACP,FACC,FAHA, [facility] 12/30/2018 12:37 PM

## 2018-12-30 NOTE — Assessment & Plan Note (Signed)
History of persistent A. fib now rate controlled on increased doses of beta-blocker and Cardizem with heart rates in the 70s.

## 2018-12-30 NOTE — ED Notes (Signed)
Ordered pt diet tray low sodium heart healthy

## 2018-12-30 NOTE — ED Provider Notes (Signed)
[facility] EMERGENCY DEPARTMENT Provider Note   CSN: [id number] Arrival date & time: 12/30/18  1528    History   Chief Complaint Chief Complaint  Patient presents with  . Atrial Fibrillation    HPI    [name] is a 63 y.o. male with a PMHx of Afib on eliquis, HTN, mitral valve regurg, GERD, CHF (EF 35-40% on 12/09/18), and other conditions listed below, who presents to the ED after being sent here from his cardiologist Dr. Gwenlyn Found for admission for diuresis and cardioversion.  Per chart review, patient was found to be in A. fib with RVR recently, was started on Eliquis and plan to have cardioversion done.  He was seen today at the cardiologist office however he was noted to have weight gain and swelling in his body, so they recommended that he be admitted for diuresis before he can have cardioversion done.  He states that over the last month he has gained 30 pounds and has had swelling in his legs, abdomen, and "all over".  He has been taking Lasix, initially 20 mg a day but then increased to 40 mg a day, and states that initially it was helping but now has stopped helping.  He reports progressively worsening shortness of breath with exertion, orthopnea to the point that he sitting in his recliner, and fatigue.  No other known aggravating factors aside from exertion, no other treatments tried.  He was sent here for admission.  His PCP is Dr. Coletta Memos at [place].  His cardiologist is Dr. Gwenlyn Found.  He denies any cough, lightheadedness, diaphoresis, fevers, chills, chest pain, recent travel/surgery/immobilization, history of DVT/PE, abdominal pain, nausea, vomiting, diarrhea, constipation, dysuria, hematuria, myalgias, arthralgias, numbness, tingling, focal weakness, claudication, or any other complaints at this time.  The history is provided by the patient and medical records. No language interpreter was used.  Atrial Fibrillation  Associated symptoms include shortness of breath.  Pertinent negatives include no chest pain and no abdominal pain.    Past Medical History:  Diagnosis Date  . Allergy   . Anxiety   . Arthritis    knees--S/P RIGHT TOTAL KNEE REPLACED 01/19/14   . GERD (gastroesophageal reflux disease)    hx of, not freq  WOULD USE TUMS IF NEEDED  . Heart murmur    PT STATES HIS MEDICAL DOCTOR MONITORING HIS MURMUR - NOT CAUSING PT ANY PROBLEMS- STATES HE WAS NOT AWARE OF MURMUR UNTIL PHYSICAL 1014  . History of colon polyps   . Hypertension    takes Cardura and NOrvasc daily  . Insomnia   . Mitral valve regurgitation    moderate to severe  . Seizures ([place])    had one approx. 30 yrs ago,has not had any since    Patient Active Problem List   Diagnosis Date Noted  . Chronic systolic (congestive) heart failure ([place]) 12/30/2018  . Persistent atrial fibrillation 12/02/2018  . Right hamstring muscle strain 07/20/2018  . Essential hypertension 02/07/2016  . Mitral regurgitation 02/07/2016  . Arthritis of left knee 10/23/2014  . Status post total left knee replacement 10/23/2014  . Arthritis of knee, right 01/19/2014  . Status post total knee replacement 01/19/2014    Past Surgical History:  Procedure Laterality Date  . ANKLE FUSION  left   Dec. 2012  . ARTHROSCOPY KNEE W/ DRILLING  bilateral   2012  . COLONOSCOPY     x2  . FOOT ARTHRODESIS  06/23/2012   Procedure: ARTHRODESIS FOOT;  Surgeon: Jenny Reichmann  Doran Durand, MD;  Location: [facility];  Service: Orthopedics;  Laterality: Left;  Left Subtalar and Talonavicular Joint Revision Arthrodesis  Aspiration of Bone Marrow from Left Hip   . HAIR TRANSPLANT    . HARDWARE REMOVAL  06/23/2012   Procedure: HARDWARE REMOVAL;  Surgeon: Wylene Simmer, MD;  Location: [facility];  Service: Orthopedics;  Laterality: Left;  Removal of Deep Implant  X's 3  . INSERTION OF MESH N/A 07/10/2016   Procedure: INSERTION OF MESH;  Surgeon: Coralie Keens, MD;  Location: [facility];  Service: General;  Laterality: N/A;  .  JOINT REPLACEMENT     right hip  01-2011  . LIMB SPARING RESECTION HIP W/ SADDLE JOINT REPLACEMENT Right   . TOTAL KNEE ARTHROPLASTY Right 01/19/2014   Procedure: RIGHT TOTAL KNEE ARTHROPLASTY, Steroid injection left knee;  Surgeon: Mcarthur Rossetti, MD;  Location: [facility];  Service: Orthopedics;  Laterality: Right;  . TOTAL KNEE ARTHROPLASTY Left 10/23/2014   Procedure: LEFT TOTAL KNEE ARTHROPLASTY;  Surgeon: Mcarthur Rossetti, MD;  Location: [facility];  Service: Orthopedics;  Laterality: Left;  . UMBILICAL HERNIA REPAIR N/A 07/10/2016   Procedure: UMBILICAL HERNIA REPAIR;  Surgeon: Coralie Keens, MD;  Location: [facility];  Service: General;  Laterality: N/A;        Home Medications    Prior to Admission medications   Medication Sig Start Date End Date Taking? Authorizing Provider  ALPRAZolam Duanne Moron) 0.5 MG tablet Take 0.5 mg by mouth at bedtime as needed for anxiety.    [provider]  apixaban (ELIQUIS) 5 MG TABS tablet Take 1 tablet (5 mg total) by mouth 2 (two) times daily. 12/02/18   Lorretta Harp, MD  CARTIA XT 240 MG 24 hr capsule Take 240 mg by mouth daily. 11/30/18   [provider]  Docusate Sodium (COLACE PO) Take by mouth. Over the counter    [provider]  doxazosin (CARDURA) 4 MG tablet Take 4 mg by mouth at bedtime.     [provider]  furosemide (LASIX) 40 MG tablet Take 1 tablet (40 mg total) by mouth daily. Patient not taking: Reported on 12/30/2018 12/20/18 03/20/19  Erlene Quan, PA-C  lisinopril (PRINIVIL,ZESTRIL) 20 MG tablet Take 10 mg by mouth every morning.     [provider]  metoprolol tartrate (LOPRESSOR) 50 MG tablet Take 1 tablet (50 mg total) by mouth 2 (two) times daily. 12/16/18 03/16/19  Lorretta Harp, MD  Multiple Vitamin (MULTIVITAMIN) LIQD Take 5 mLs by mouth daily.    [provider]  naproxen sodium (ANAPROX) 220 MG tablet Take 220 mg by mouth 2 (two) times daily  with a meal.    [provider]  OVER THE COUNTER MEDICATION Sleep aid - diphenhydramine    [provider]  potassium chloride (K-DUR) 10 MEQ tablet Take 1 tablet (10 mEq total) by mouth 2 (two) times daily. 12/20/18 03/20/19  Erlene Quan, PA-C    Family History Family History  Problem Relation Age of Onset  . Hypertension Father   . Colon cancer Father   . Diabetes Mother   . Stomach cancer Neg Hx   . Esophageal cancer Neg Hx   . Pancreatic cancer Neg Hx   . Rectal cancer Neg Hx     Social History Social History   Tobacco Use  . Smoking status: Never Smoker  . Smokeless tobacco: Never Used  Substance Use Topics  . Alcohol use: Yes  Comment: 1 drink daily  . Drug use: No     Allergies   Patient has no known allergies.   Review of Systems Review of Systems  Constitutional: Positive for fatigue and unexpected weight change. Negative for chills, diaphoresis and fever.  Respiratory: Positive for shortness of breath. Negative for cough.   Cardiovascular: Positive for leg swelling. Negative for chest pain.  Gastrointestinal: Negative for abdominal pain, constipation, diarrhea, nausea and vomiting.  Genitourinary: Negative for dysuria and hematuria.  Musculoskeletal: Negative for arthralgias and myalgias.  Skin: Negative for color change.  Allergic/Immunologic: Negative for immunocompromised state.  Neurological: Negative for weakness and numbness.  Psychiatric/Behavioral: Negative for confusion.   All other systems reviewed and are negative for acute change except as noted in the HPI.    Physical Exam Updated Vital Signs BP 118/88 (BP Location: Right Arm)   Pulse (!) 124   Temp 98.2 F (36.8 C) (Oral)   SpO2 94%   Physical Exam Vitals signs and nursing note reviewed.  Constitutional:      General: He is not in acute distress.    Appearance: Normal appearance. He is well-developed. He is not toxic-appearing.     Comments: Afebrile,  nontoxic, NAD  HENT:     Head: Normocephalic and atraumatic.  Eyes:     General:        Right eye: No discharge.        Left eye: No discharge.     Conjunctiva/sclera: Conjunctivae normal.  Neck:     Musculoskeletal: Normal range of motion and neck supple.  Cardiovascular:     Rate and Rhythm: Regular rhythm. Tachycardia present.     Pulses: Normal pulses.     Heart sounds: Normal heart sounds, S1 normal and S2 normal. No murmur. No friction rub. No gallop.      Comments: Tachycardic in the 110-120s but appears to have regular rhythm, nl s1/s2, no m/r/g appreciated although difficult to assess due to tachycardia, distal pulses intact, 3+ b/l pedal edema  Pulmonary:     Effort: Pulmonary effort is normal. No respiratory distress.     Breath sounds: Normal breath sounds. No decreased breath sounds, wheezing, rhonchi or rales.     Comments: CTAB in all lung fields, no w/r/r appreciated, no hypoxia or significantly increased WOB, but speaking in fragmented sentences and pausing throughout evaluation, SpO2 94% on RA  Abdominal:     General: Bowel sounds are normal. There is no distension.     Palpations: Abdomen is soft. Abdomen is not rigid.     Tenderness: There is no abdominal tenderness. There is no right CVA tenderness, left CVA tenderness, guarding or rebound. Negative signs include Murphy's sign and McBurney's sign.  Musculoskeletal: Normal range of motion.     Right lower leg: 3+ Edema present.     Left lower leg: 3+ Edema present.  Skin:    General: Skin is warm and dry.     Findings: No rash.  Neurological:     Mental Status: He is alert and oriented to person, place, and time.     Sensory: Sensation is intact. No sensory deficit.     Motor: Motor function is intact.  Psychiatric:        Mood and Affect: Mood and affect normal.        Behavior: Behavior normal.      ED Treatments / Results  Labs (all labs ordered are listed, but only abnormal results are  displayed) Labs Reviewed  BASIC  METABOLIC PANEL - Abnormal; Notable for the following components:      Result Value   CO2 21 (*)    Glucose, Bld 130 (*)    BUN 31 (*)    All other components within normal limits  CBC - Abnormal; Notable for the following components:   RDW 16.9 (*)    All other components within normal limits  BRAIN NATRIURETIC PEPTIDE - Abnormal; Notable for the following components:   B Natriuretic Peptide 1,365.8 (*)    All other components within normal limits  TROPONIN I    EKG EKG Interpretation  Date/Time:  Friday December 30 2018 15:45:22 EST Ventricular Rate:  122 PR Interval:    QRS Duration: 148 QT Interval:  388 QTC Calculation: 552 R Axis:   -61 Text Interpretation:  Wide QRS tachycardia with occasional Premature ventricular complexes Left axis deviation Right bundle branch block T wave abnormality, consider lateral ischemia Abnormal ECG Confirmed by Milton Ferguson [phone]) on 12/30/2018 5:24:50 PM   Radiology Dg Chest 2 View  Result Date: 12/30/2018 CLINICAL DATA:  Short of breath and atrial fibrillation. EXAM: CHEST - 2 VIEW COMPARISON:  01/15/2014 FINDINGS: Mild cardiomegaly. Normal vascularity. Low lung volumes. Bibasilar atelectasis. Small right pleural effusion. No pneumothorax. IMPRESSION: Cardiomegaly without decompensation. Bibasilar atelectasis. Small right pleural effusion. Electronically Signed   By: Marybelle Killings M.D.   On: 12/30/2018 16:40    Echo 12/09/18: IMPRESSIONS  1. The left ventricle has moderately reduced systolic function of 35-32%. The cavity size was severely increased. There is mildly increased left ventricular wall thickness. Left ventricular diastology could not be evaluated Left ventricular diffuse  hypokinesis.  2. The right ventricle has moderately reduced systolic function. The cavity was normal. There is no increase in right ventricular wall thickness.  3. Left atrial size was severely dilated.  4. Right atrial size  was severely dilated.  5. Possible flail segement of posterior MV leaflet. Mitral valve regurgitation is moderate by color flow Doppler.  6. The tricuspid valve is normal in structure.  7. The aortic valve is tricuspid There is mild thickening of the aortic valve.  8. The pulmonic valve was normal in structure.  9. There is mild dilatation of the aortic root and ascending aorta. 10. Moderate global reduction in LV systolic function; severe LVE; severe biatrial enlargement; moderate RV dysfunction; possible flail segment of posterior MV leaflet; eccentric MR difficult to quantitate; suggest TEE to further assess.   Procedures Procedures (including critical care time)  Medications Ordered in ED Medications  furosemide (LASIX) injection 40 mg (has no administration in time range)  metoprolol tartrate (LOPRESSOR) injection 5 mg (has no administration in time range)     Initial Impression / Assessment and Plan / ED Course  I have reviewed the triage vital signs and the nursing notes.  Pertinent labs & imaging results that were available during my care of the patient were reviewed by me and considered in my medical decision making (see chart for details).        63 y.o. male sent here for admission from his cardiologist office.  Has gained 30 pounds in the last month, having swelling throughout his body, and dyspnea on exertion/shortness of breath.  On exam, tachycardic in the 110-120s but seems regular, clear lung exam without rales or rhonchi, no wheezing, speaking in fragmented sentences, SPO2 94% on room air, 3+ pitting edema bilaterally in the legs, no abdominal tenderness. Work up thus far reveals: CXR with cardiomegaly and small right  pleural effusion with bibasilar atelectasis, BMP with BUN 31 and gluc 130 but otherwise WNL, CBC WNL, trop neg, EKG with wide QRS tachycardia and PVCs. BNP pending. Will give lasix and dose of lopressor to see if we can slow his rate down, and admit. Will  consult cardiology to see if they would be admitting team. Discussed case with my attending Dr. Roderic Palau who agrees with plan.   5:36 PM BNP 1365.8. Dr. Sallyanne Kuster of cardiology returning page, will admit. Holding orders to be placed by admitting team. Please see their notes for further documentation of care. I appreciate their help with this pleasant pt's care. Pt stable at time of admission.    Final Clinical Impressions(s) / ED Diagnoses   Final diagnoses:  Acute on chronic systolic congestive heart failure (HCC)  Tachycardia  SOB (shortness of breath) on exertion    ED Discharge Orders    [address] [place], [place] 12/30/18 1738    Milton Ferguson, MD 12/30/18 [phone]

## 2018-12-30 NOTE — ED Triage Notes (Signed)
Pt sent by dr due to a-fib, shob and edema to lower extremities. Pt has been gaining 2 lbs per day of fluid. Takes 40mg  lasix per day which has not been working. Pt has hx of a-fib and cardiology was going to cardiovert pt but cannot until they get fluid retention under control.

## 2018-12-30 NOTE — Patient Instructions (Signed)
Medication Instructions:  Your physician recommends that you continue on your current medications as directed. Please refer to the Current Medication list given to you today.  If you need a refill on your cardiac medications before your next appointment, please call your pharmacy.   Lab work: NONE If you have labs (blood work) drawn today and your tests are completely normal, you will receive your results only by: Marland Kitchen MyChart Message (if you have MyChart) OR . A paper copy in the mail If you have any lab test that is abnormal or we need to change your treatment, we will call you to review the results.  Testing/Procedures: NONE   Any Other Special Instructions Will Be Listed Below (If Applicable). DR. Gwenlyn Found RECOMMENDS THAT YOU BE ADMITTED TO THE EMERGENCY DEPARTMENT.

## 2018-12-30 NOTE — H&P (Signed)
Cardiology Admission History and Physical:   Patient ID: [name] MRN: [id number]; DOB: 1956-01-06   Admission date: 12/30/2018  Primary Care Provider: [name], MD Primary Cardiologist: No primary care provider on file. [place] Primary Electrophysiologist:  None   Chief Complaint:  Dyspnea, edema  Patient Profile:   [name] is a 63 y.o. male with longstanding mitral regurgitation, recent onset atrial fibrillation now presenting with first ever episode of heart failure exacerbation, atrial flutter with 2: 1 AV block and rapid ventricular response, recent echo showing decreased left ventricular systolic function and worsening mitral insufficiency  History of Present Illness:   [name] was seen earlier in the office and referred for admission.  He has longstanding systemic hypertension and bears a diagnosis of mitral regurgitation.  He was started on anticoagulation with Eliquis for atrial fibrillation with rapid ventricular response diagnosed flu 1 month ago.  Rate control was achieved with beta-blockers and there was a plan for cardioversion after roughly a month.  However he returned with 20 pounds of weight gain, substantial leg edema and dyspnea with minimal activity.  He has had intermittent problems with paroxysmal nocturnal dyspnea.  He believes he has gained a total of roughly 35 pounds from his baseline.  This began when he had problems with right-sided sciatica treated with a lumbar spine injection of steroids as well as gabapentin, last October.  Sciatica no longer bothers him.  Has not had angina.  He is unaware of palpitations.  He denies dizziness or syncope.  He has not had any focal neurological events or bleeding complications.  He reports 100% compliance with the anticoagulant.  An echocardiogram performed on February 7 shows a dilated left ventricle, moderately decreased left ventricular systolic function with an ejection fraction of 35-40% with global  hypokinesis, severe biatrial dilation, possible flail segment of the posterior mitral leaflet with eccentric mitral regurgitation that was difficult to quantify.  TEE was recommended.  This is a marked change compared to the transthoracic echo performed in June 2019 when he had normal left ventricular size and systolic function, severe posterior leaflet prolapse with moderate anteriorly directed mitral regurgitation.  Although the left ventricle was reported as normal in size, the end-diastolic diameter was actually dilated at 62 mm with a borderline end-systolic diameter of 38 mm.   Past Medical History:  Diagnosis Date  . Allergy   . Anxiety   . Arthritis    knees--S/P RIGHT TOTAL KNEE REPLACED 01/19/14   . GERD (gastroesophageal reflux disease)    hx of, not freq  WOULD USE TUMS IF NEEDED  . Heart murmur    PT STATES HIS MEDICAL DOCTOR MONITORING HIS MURMUR - NOT CAUSING PT ANY PROBLEMS- STATES HE WAS NOT AWARE OF MURMUR UNTIL PHYSICAL 1014  . History of colon polyps   . Hypertension    takes Cardura and NOrvasc daily  . Insomnia   . Mitral valve regurgitation    moderate to severe  . Seizures ([place])    had one approx. 30 yrs ago,has not had any since    Past Surgical History:  Procedure Laterality Date  . ANKLE FUSION  left   Dec. 2012  . ARTHROSCOPY KNEE W/ DRILLING  bilateral   2012  . COLONOSCOPY     x2  . FOOT ARTHRODESIS  06/23/2012   Procedure: ARTHRODESIS FOOT;  Surgeon: Wylene Simmer, MD;  Location: [facility];  Service: Orthopedics;  Laterality: Left;  Left Subtalar and Talonavicular Joint Revision Arthrodesis  Aspiration of Bone Marrow from Left Hip   . HAIR TRANSPLANT    . HARDWARE REMOVAL  06/23/2012   Procedure: HARDWARE REMOVAL;  Surgeon: Wylene Simmer, MD;  Location: [facility];  Service: Orthopedics;  Laterality: Left;  Removal of Deep Implant  X's 3  . INSERTION OF MESH N/A 07/10/2016   Procedure: INSERTION OF MESH;  Surgeon: Coralie Keens, MD;  Location: [facility];  Service: General;  Laterality: N/A;  . JOINT REPLACEMENT     right hip  01-2011  . LIMB SPARING RESECTION HIP W/ SADDLE JOINT REPLACEMENT Right   . TOTAL KNEE ARTHROPLASTY Right 01/19/2014   Procedure: RIGHT TOTAL KNEE ARTHROPLASTY, Steroid injection left knee;  Surgeon: Mcarthur Rossetti, MD;  Location: [facility];  Service: Orthopedics;  Laterality: Right;  . TOTAL KNEE ARTHROPLASTY Left 10/23/2014   Procedure: LEFT TOTAL KNEE ARTHROPLASTY;  Surgeon: Mcarthur Rossetti, MD;  Location: [facility];  Service: Orthopedics;  Laterality: Left;  . UMBILICAL HERNIA REPAIR N/A 07/10/2016   Procedure: UMBILICAL HERNIA REPAIR;  Surgeon: Coralie Keens, MD;  Location: [facility];  Service: General;  Laterality: N/A;     Medications Prior to Admission: Prior to Admission medications   Medication Sig Start Date End Date Taking? Authorizing Provider  ALPRAZolam Duanne Moron) 0.5 MG tablet Take 0.5 mg by mouth at bedtime as needed for anxiety.    [provider]  apixaban (ELIQUIS) 5 MG TABS tablet Take 1 tablet (5 mg total) by mouth 2 (two) times daily. 12/02/18   Lorretta Harp, MD  CARTIA XT 240 MG 24 hr capsule Take 240 mg by mouth daily. 11/30/18   [provider]  Docusate Sodium (COLACE PO) Take by mouth. Over the counter    [provider]  doxazosin (CARDURA) 4 MG tablet Take 4 mg by mouth at bedtime.     [provider]  furosemide (LASIX) 40 MG tablet Take 1 tablet (40 mg total) by mouth daily. Patient not taking: Reported on 12/30/2018 12/20/18 03/20/19  Erlene Quan, PA-C  lisinopril (PRINIVIL,ZESTRIL) 20 MG tablet Take 10 mg by mouth every morning.     [provider]  metoprolol tartrate (LOPRESSOR) 50 MG tablet Take 1 tablet (50 mg total) by mouth 2 (two) times daily. 12/16/18 03/16/19  Lorretta Harp, MD  Multiple Vitamin (MULTIVITAMIN) LIQD Take 5 mLs by mouth daily.    [provider]  naproxen sodium (ANAPROX) 220  MG tablet Take 220 mg by mouth 2 (two) times daily with a meal.    [provider]  OVER THE COUNTER MEDICATION Sleep aid - diphenhydramine    [provider]  potassium chloride (K-DUR) 10 MEQ tablet Take 1 tablet (10 mEq total) by mouth 2 (two) times daily. 12/20/18 03/20/19  Erlene Quan, PA-C     Allergies:   No Known Allergies  Social History:   Social History   Socioeconomic History  . Marital status: Married    Spouse name: Not on file  . Number of children: Not on file  . Years of education: Not on file  . Highest education level: Not on file  Occupational History  . Not on file  Social Needs  . Financial resource strain: Not on file  . Food insecurity:    Worry: Not on file    Inability: Not on file  . Transportation needs:    Medical: Not on file    Non-medical: Not on file  Tobacco Use  .  Smoking status: Never Smoker  . Smokeless tobacco: Never Used  Substance and Sexual Activity  . Alcohol use: Yes    Comment: 1 drink daily  . Drug use: No  . Sexual activity: Yes  Lifestyle  . Physical activity:    Days per week: Not on file    Minutes per session: Not on file  . Stress: Not on file  Relationships  . Social connections:    Talks on phone: Not on file    Gets together: Not on file    Attends religious service: Not on file    Active member of club or organization: Not on file    Attends meetings of clubs or organizations: Not on file    Relationship status: Not on file  . Intimate partner violence:    Fear of current or ex partner: Not on file    Emotionally abused: Not on file    Physically abused: Not on file    Forced sexual activity: Not on file  Other Topics Concern  . Not on file  Social History Narrative  . Not on file    Family History:   The patient's family history includes Colon cancer in his father; Diabetes in his mother; Hypertension in his father. There is no history of Stomach cancer, Esophageal cancer, Pancreatic  cancer, or Rectal cancer.    ROS:  Please see the history of present illness.  All other ROS reviewed and negative.     Physical Exam/Data:   Vitals:   12/30/18 1553  BP: 118/88  Pulse: (!) 124  Temp: 98.2 F (36.8 C)  TempSrc: Oral  SpO2: 94%   No intake or output data in the 24 hours ending 12/30/18 1803 Last 3 Weights 12/30/2018 12/02/2018 09/17/2017  Weight (lbs) 287 lb 6.4 oz 265 lb 3.2 oz 262 lb  Weight (kg) 130.364 kg 120.294 kg 118.842 kg     There is no height or weight on file to calculate BMI.  General:  Well nourished, well developed, in no acute distress.  When speaking in longer sentences, has to stop to catch his breath.  He is moderately obese but is also tall and muscular. HEENT: normal Lymph: no adenopathy Neck: 8 cm JVD Endocrine:  No thryomegaly Vascular: No carotid bruits; FA pulses 2+ bilaterally without bruits  Cardiac: Laterally displaced apical impulse, normal S1, S2; RRR; no murmur 3/6 holosystolic apical murmur and there also appears to be an accompanying diastolic rumble, suggestive of severe MR Lungs:  clear to auscultation bilaterally, no wheezing, rhonchi or rales  Abd: soft, nontender, no hepatomegaly  Ext: 3+ pitting edema to the knees, symmetrical Musculoskeletal:  No deformities, BUE and BLE strength normal and equal Skin: warm and dry  Neuro:  CNs 2-12 intact, no focal abnormalities noted Psych:  Normal affect    EKG:  The ECG that was done  was personally reviewed and demonstrates atrial flutter with 2:1 AV block and RVR, and the wide-complex beat which is probably a PVC/fusion beat, right bundle branch block and left anterior fascicular block  Relevant CV Studies: 12/09/2018 ECHO IMPRESSIONS    1. The left ventricle has moderately reduced systolic function of 42-68%. The cavity size was severely increased. There is mildly increased left ventricular wall thickness. Left ventricular diastology could not be evaluated Left ventricular  diffuse  hypokinesis.  2. The right ventricle has moderately reduced systolic function. The cavity was normal. There is no increase in right ventricular wall thickness.  3. Left atrial  size was severely dilated.  4. Right atrial size was severely dilated.  5. Possible flail segement of posterior MV leaflet. Mitral valve regurgitation is moderate by color flow Doppler.  6. The tricuspid valve is normal in structure.  7. The aortic valve is tricuspid There is mild thickening of the aortic valve.  8. The pulmonic valve was normal in structure.  9. There is mild dilatation of the aortic root and ascending aorta. 10. Moderate global reduction in LV systolic function; severe LVE; severe biatrial enlargement; moderate RV dysfunction; possible flail segment of posterior MV leaflet; eccentric MR difficult to quantitate; suggest TEE to further assess.  Laboratory Data:  Chemistry Recent Labs  Lab 12/28/18 1025 12/30/18 1558  NA 138 136  K 4.9 4.0  CL 100 102  CO2 32* 21*  GLUCOSE 116* 130*  BUN 35* 31*  CREATININE 1.19 1.12  CALCIUM 9.0 9.0  GFRNONAA 65 >60  GFRAA 75 >60  ANIONGAP  --  13    No results for input(s): PROT, ALBUMIN, AST, ALT, ALKPHOS, BILITOT in the last 168 hours. Hematology Recent Labs  Lab 12/30/18 1558  WBC 6.4  RBC 4.69  HGB 13.0  HCT 42.7  MCV 91.0  MCH 27.7  MCHC 30.4  RDW 16.9*  PLT 150   Cardiac Enzymes Recent Labs  Lab 12/30/18 1558  TROPONINI <0.03   No results for input(s): TROPIPOC in the last 168 hours.  BNP Recent Labs  Lab 12/30/18 1558  BNP 1,365.8*    DDimer No results for input(s): DDIMER in the last 168 hours.  Radiology/Studies:  Dg Chest 2 View  Result Date: 12/30/2018 CLINICAL DATA:  Short of breath and atrial fibrillation. EXAM: CHEST - 2 VIEW COMPARISON:  01/15/2014 FINDINGS: Mild cardiomegaly. Normal vascularity. Low lung volumes. Bibasilar atelectasis. Small right pleural effusion. No pneumothorax. IMPRESSION: Cardiomegaly  without decompensation. Bibasilar atelectasis. Small right pleural effusion. Electronically Signed   By: Marybelle Killings M.D.   On: 12/30/2018 16:40    Assessment and Plan:   1. Acute systolic heart failure: There has been recent severe reduction left ventricular systolic function.  This may be due to valvular heart disease and/or tachycardia cardiomyopathy.  At some point, he would require right and left heart catheterization, but first requires diuresis.  We will treat with intravenous diuretics, Entresto, beta-blockers for rate control.  He reports his "dry weight" at around 250 pounds and currently weighs approximately 285 pounds. 2. AFib w RVR, now AFlutter: Suspect he may have atypical, left-sided atrial flutter, but hard to tell from the current ECG.  Anticipate he will be very difficult to rate control and plan cardioversion once we have achieved reasonably good diuresis.  Been compliant with anticoagulants.  He does not need a TEE prior to the cardioversion, but one will be necessary to reevaluate his mitral valve. 3. MR: His physical exam is consistent with severe mitral regurgitation.  It is quite possible that he has had a longstanding history of mitral valve prolapse and has subsequently developed chordal rupture and severe insufficiency.  This may be the reason for his decompensation over the last approximately 4 months.  Both the TEE procedure and the cardioversion procedure has been fully reviewed with the patient and informed consent has been obtained.   Severity of Illness: The appropriate patient status for this patient is INPATIENT. Inpatient status is judged to be reasonable and necessary in order to provide the required intensity of service to ensure the patient's safety. The patient's presenting symptoms,  physical exam findings, and initial radiographic and laboratory data in the context of their chronic comorbidities is felt to place them at high risk for further clinical  deterioration. Furthermore, it is not anticipated that the patient will be medically stable for discharge from the hospital within 2 midnights of admission. The following factors support the patient status of inpatient.   " The patient's presenting symptoms include dyspnea at rest, edema. " The worrisome physical exam findings include severe volume overload, tachycardia, arrhythmia. " The initial radiographic and laboratory data are worrisome because of atrial  Flutter, cardiomegaly, probably severe MR, acute heart failure. " The chronic co-morbidities include obesity, HTN.   * I certify that at the point of admission it is my clinical judgment that the patient will require inpatient hospital care spanning beyond 2 midnights from the point of admission due to high intensity of service, high risk for further deterioration and high frequency of surveillance required.*    For questions or updates, please contact [place] Please consult [email or web address] for contact info under        Signed, Sanda Klein, MD  12/30/2018 6:03 PM

## 2018-12-31 DIAGNOSIS — I5041 Acute combined systolic (congestive) and diastolic (congestive) heart failure: Secondary | ICD-10-CM

## 2018-12-31 LAB — BASIC METABOLIC PANEL
Anion gap: 10 (ref 5–15)
BUN: 25 mg/dL — ABNORMAL HIGH (ref 8–23)
CHLORIDE: 101 mmol/L (ref 98–111)
CO2: 29 mmol/L (ref 22–32)
Calcium: 8.7 mg/dL — ABNORMAL LOW (ref 8.9–10.3)
Creatinine, Ser: 1.13 mg/dL (ref 0.61–1.24)
GFR calc Af Amer: >60 mL/min (ref >60)
GFR calc non Af Amer: >60 mL/min (ref >60)
Glucose, Bld: 107 mg/dL — ABNORMAL HIGH (ref 70–99)
POTASSIUM: 3.9 mmol/L (ref 3.5–5.1)
SODIUM: 140 mmol/L (ref 135–145)

## 2018-12-31 LAB — MAGNESIUM: Magnesium: 2 mg/dL (ref 1.7–2.4)

## 2018-12-31 MED ORDER — SPIRONOLACTONE 25 MG PO TABS
25.0000 mg | ORAL_TABLET | Freq: Every day | ORAL | Status: DC
  Administered 2018-12-31 – 2019-01-04 (×5): 25 mg via ORAL
  Filled 2018-12-31 (×5): qty 1

## 2018-12-31 NOTE — Progress Notes (Signed)
Progress Note  Patient Name: [name] Date of Encounter: 12/31/2018  Primary Cardiologist: Dr Gwenlyn Found  Subjective   No dyspnea or CP.  Inpatient Medications    Scheduled Meds: . apixaban  5 mg Oral BID  . doxazosin  4 mg Oral QHS  . furosemide  40 mg Intravenous Q12H  . Influenza vac split quadrivalent PF  0.5 mL Intramuscular Tomorrow-1000  . metoprolol tartrate  100 mg Oral BID  . potassium chloride  10 mEq Oral BID  . [START ON 01/01/2019] sacubitril-valsartan  1 tablet Oral BID   Continuous Infusions:  PRN Meds: ALPRAZolam   Vital Signs    Vitals:   12/31/18 0012 12/31/18 0449 12/31/18 0500 12/31/18 0744  BP: 94/71 (!) 117/94  (!) 126/97  Pulse: 95 (!) 102  (!) 120  Resp: 20 18  19   Temp: 98.4 F (36.9 C) 98 F (36.7 C)  98.6 F (37 C)  TempSrc: Oral Oral  Oral  SpO2: 93% 92%  94%  Weight:   124 kg   Height:        Intake/Output Summary (Last 24 hours) at 12/31/2018 1030 Last data filed at 12/31/2018 1025 Gross per 24 hour  Intake 840 ml  Output 3675 ml  Net -2835 ml   Last 3 Weights 12/31/2018 12/30/2018 12/30/2018  Weight (lbs) 273 lb 6.4 oz 277 lb 11.2 oz 287 lb 6.4 oz  Weight (kg) 124.013 kg 125.964 kg 130.364 kg      Telemetry    Atrial flutter with RVR - Personally Reviewed  Physical Exam   GEN: No acute distress.   Neck: No JVD Cardiac: RRR, 3/6 systolic murmur apex Respiratory: Clear to auscultation bilaterally. GI: Soft, nontender, non-distended  MS: 1-2 + edema Neuro:  Nonfocal  Psych: Normal affect   Labs    Chemistry Recent Labs  Lab 12/28/18 1025 12/30/18 1558 12/31/18 0643  NA 138 136 140  K 4.9 4.0 3.9  CL 100 102 101  CO2 32* 21* 29  GLUCOSE 116* 130* 107*  BUN 35* 31* 25*  CREATININE 1.19 1.12 1.13  CALCIUM 9.0 9.0 8.7*  GFRNONAA 65 >60 >60  GFRAA 75 >60 >60  ANIONGAP  --  13 10     Hematology Recent Labs  Lab 12/30/18 1558  WBC 6.4  RBC 4.69  HGB 13.0  HCT 42.7  MCV 91.0  MCH 27.7  MCHC 30.4    RDW 16.9*  PLT 150    Cardiac Enzymes Recent Labs  Lab 12/30/18 1558  TROPONINI <0.03    BNP Recent Labs  Lab 12/30/18 1558  BNP 1,365.8*      Radiology    Dg Chest 2 View  Result Date: 12/30/2018 CLINICAL DATA:  Short of breath and atrial fibrillation. EXAM: CHEST - 2 VIEW COMPARISON:  01/15/2014 FINDINGS: Mild cardiomegaly. Normal vascularity. Low lung volumes. Bibasilar atelectasis. Small right pleural effusion. No pneumothorax. IMPRESSION: Cardiomegaly without decompensation. Bibasilar atelectasis. Small right pleural effusion. Electronically Signed   By: Marybelle Killings M.D.   On: 12/30/2018 16:40    Patient Profile     63 y.o. male with past medical history of mitral valve prolapse/mitral regurgitation, recent onset atrial fibrillation admitted with acute combined systolic/diastolic congestive heart failure.  Most recent echocardiogram December 09, 2018 showed ejection fraction 35 to 40%, moderate RV dysfunction, biatrial enlargement, possible flail segment of posterior mitral valve leaflet with at least moderate mitral regurgitation, TEE recommended.  Assessment & Plan    1 acute combined  systolic/diastolic congestive heart failure-I/O-1975.  Symptomatic improvement compared to admission.  Continue Lasix at present dose.  Add spironolactone 25 mg daily.  Follow renal function.  2 cardiomyopathy-question related to mitral regurgitation versus recent atrial fibrillation/tachycardia.  Continue metoprolol and Entresto.  3 atrial flutter-patient is in atrial flutter today.  He has been compliant with apixaban and has been on this medication for 30 days.  We will plan cardioversion next week.  4 mitral valve regurgitation/possible flail segment of posterior mitral valve leaflet-plan is for transesophageal echocardiogram next week prior to cardioversion to assess mitral valve morphology and severity of mitral regurgitation.  If MR severe would proceed with cardioversion.  Would  then continue apixaban for 4 weeks and then proceed with right and left cardiac catheterization followed by mitral valve repair/maze procedure.  For questions or updates, please contact [place] Please consult [email or web address] for contact info under        Signed, Kirk Ruths, MD  12/31/2018, 10:30 AM

## 2018-12-31 NOTE — Progress Notes (Signed)
Received report from Nurse Zigmund Daniel, patient is in bed with no complains and wife at bedside.

## 2018-12-31 NOTE — Plan of Care (Signed)
Nutrition Education Note  RD consulted for nutrition education regarding new onset CHF.  Pt seen with his spouse. They are both fairly health conscious.   Diet recall Breakfast: Premier protein (8 oz?) + 2-3 cups of coffee Lunch: He eats a light lunch. Soup (homemade) or sandwich w/ bottle green tea Afternoon snack: Fresh fruit bowl.  Dinner: Typically full meal w/ meat, vegetable and grain Beverages: Coffee, alcohol, green tea, water, coffee Other snacks: mixed/unsalted nuts Behaviors: Does not regularly eat out- only does so when they go to the beach. Does read labels, but only looks at total fat and total calories. Does use salt shaker, but it is minimal. Etoh: 4-5 oz whiskey (w/ water) after work, +/- glass of wine at night  His fluid intake sounds to be a larger issue than  his sodium intake. He drinks: a premier protein and 2-3 cups coffee in the morning, 3-4 16.9 oz bottles of water/tea during the day and then he fixes himself "a large drink" (whiskey w/ water) when he gets hope from work (4-5 oz whiskey). He says he may have a glass of wine at night as well. On top of these beverages, he eats a large amount of fresh fruit, which is water dense.   He says he does add salt to certain foods, but says it is only a "pinch". His spouse prepares foods with small amounts of [place] salt. RD explained that "salt is salt' and the type of salt does not truly matter. RD also explained how he may only be using small amounts of salt, he is likely adding more than he realizes. Noted that 1 single tsp of salt can contain 2000 mg of salt- or roughly the amount he should have in an entire day.   We discussed label reading. Asked him to focus more on the total sodium and portion size instead of the fat/kcals. He already is aware of the importance of taking into account the listed portion size. RD recommended that he add up the total salt he eats for a day or two and compare this to the recommendation of  2000 mg/day.   We discussed high sodium items. He denies eating frozen meals, hotdogs, deli meats, or condensed soups with any sort of frequency. The soup mentioned in diet recall is homemade with salt free broth and salt free canned items.   RD provided handout "Heart failure Nutrition Therapy" handout from the [employer]. RD recommended he talk to his cardiologist about fluid intake and if he needs to follow any sort of restriction.   Expect Good compliance. Pt and spouse are already health conscious. They sound motivated to follow a HF diet. His meals sound to be overall appropriate. He does sound to drink a considerable amount of fluid. They were receptive to RD recommendations and asked appropriate questions.   Body mass index is 34.17 kg/m. Pt meets criteria for obese based on current BMI.  No further nutrition interventions warranted at this time.  If additional nutrition issues arise, please re-consult RD.   Burtis Junes RD, LDN, CNSC Clinical Nutrition Available Tues-Sat via Pager: [phone] 12/31/2018 11:05 AM

## 2019-01-01 LAB — BASIC METABOLIC PANEL
Anion gap: 7 (ref 5–15)
BUN: 22 mg/dL (ref 8–23)
CO2: 32 mmol/L (ref 22–32)
Calcium: 8.7 mg/dL — ABNORMAL LOW (ref 8.9–10.3)
Chloride: 102 mmol/L (ref 98–111)
Creatinine, Ser: 1.23 mg/dL (ref 0.61–1.24)
GFR calc Af Amer: >60 mL/min (ref >60)
GFR calc non Af Amer: >60 mL/min (ref >60)
GLUCOSE: 106 mg/dL — AB (ref 70–99)
Potassium: 3.7 mmol/L (ref 3.5–5.1)
Sodium: 141 mmol/L (ref 135–145)

## 2019-01-01 MED ORDER — FUROSEMIDE 10 MG/ML IJ SOLN
40.0000 mg | Freq: Two times a day (BID) | INTRAMUSCULAR | Status: DC
  Administered 2019-01-01 – 2019-01-02 (×4): 40 mg via INTRAVENOUS
  Filled 2019-01-01 (×4): qty 4

## 2019-01-01 NOTE — Progress Notes (Addendum)
Patient is in bed with no complains, he was able to take a shower today per his request. He has a moisture associated skin damage in both groin, areas were cleansed and antifungal powder was applied.

## 2019-01-01 NOTE — Progress Notes (Addendum)
Progress Note  Patient Name: [name] Date of Encounter: 01/01/2019  Primary Cardiologist: Dr Gwenlyn Found  Subjective   Pt denies CP; mildly dyspneic  Inpatient Medications    Scheduled Meds: . apixaban  5 mg Oral BID  . doxazosin  4 mg Oral QHS  . furosemide  40 mg Intravenous BID  . Influenza vac split quadrivalent PF  0.5 mL Intramuscular Tomorrow-1000  . metoprolol tartrate  100 mg Oral BID  . potassium chloride  10 mEq Oral BID  . sacubitril-valsartan  1 tablet Oral BID  . spironolactone  25 mg Oral Daily   Continuous Infusions:  PRN Meds: ALPRAZolam   Vital Signs    Vitals:   12/31/18 1806 12/31/18 2005 12/31/18 2343 01/01/19 0549  BP: (!) 116/95 (!) 120/99 (!) 117/91 (!) 123/100  Pulse: (!) 124 (!) 124 (!) 119 (!) 119  Resp: 18 18 18 18   Temp: 98.9 F (37.2 C) 98.1 F (36.7 C) 98 F (36.7 C) 98 F (36.7 C)  TempSrc: Oral Oral Oral Oral  SpO2: 95% 96% 98% 98%  Weight:    121.2 kg  Height:        Intake/Output Summary (Last 24 hours) at 01/01/2019 0732 Last data filed at 01/01/2019 0300 Gross per 24 hour  Intake 757 ml  Output 4350 ml  Net -3593 ml   Last 3 Weights 01/01/2019 12/31/2018 12/30/2018  Weight (lbs) 267 lb 3.2 oz 273 lb 6.4 oz 277 lb 11.2 oz  Weight (kg) 121.201 kg 124.013 kg 125.964 kg      Telemetry    Atrial flutter with RVR - Personally Reviewed  Physical Exam   GEN: No acute distress.  WD WN Neck: supple Cardiac: RRR, 3/6 systolic murmur apex, no gallop Respiratory: Clear to auscultation bilaterally; no wheeze GI: Soft, NT/ND MS: 1 + edema Neuro:  Grossly intact Psych: Normal affect   Labs    Chemistry Recent Labs  Lab 12/30/18 1558 12/31/18 0643 01/01/19 0359  NA 136 140 141  K 4.0 3.9 3.7  CL 102 101 102  CO2 21* 29 32  GLUCOSE 130* 107* 106*  BUN 31* 25* 22  CREATININE 1.12 1.13 1.23  CALCIUM 9.0 8.7* 8.7*  GFRNONAA >60 >60 >60  GFRAA >60 >60 >60  ANIONGAP 13 10 7      Hematology Recent Labs  Lab  12/30/18 1558  WBC 6.4  RBC 4.69  HGB 13.0  HCT 42.7  MCV 91.0  MCH 27.7  MCHC 30.4  RDW 16.9*  PLT 150    Cardiac Enzymes Recent Labs  Lab 12/30/18 1558  TROPONINI <0.03    BNP Recent Labs  Lab 12/30/18 1558  BNP 1,365.8*      Radiology    Dg Chest 2 View  Result Date: 12/30/2018 CLINICAL DATA:  Short of breath and atrial fibrillation. EXAM: CHEST - 2 VIEW COMPARISON:  01/15/2014 FINDINGS: Mild cardiomegaly. Normal vascularity. Low lung volumes. Bibasilar atelectasis. Small right pleural effusion. No pneumothorax. IMPRESSION: Cardiomegaly without decompensation. Bibasilar atelectasis. Small right pleural effusion. Electronically Signed   By: Marybelle Killings M.D.   On: 12/30/2018 16:40    Patient Profile     63 y.o. male with past medical history of mitral valve prolapse/mitral regurgitation, recent onset atrial fibrillation admitted with acute combined systolic/diastolic congestive heart failure.  Most recent echocardiogram December 09, 2018 showed ejection fraction 35 to 40%, moderate RV dysfunction, biatrial enlargement, possible flail segment of posterior mitral valve leaflet with at least moderate mitral regurgitation,  TEE recommended.  Assessment & Plan    1 acute combined systolic/diastolic congestive heart failure-[id number]/O-3593.  Wt 121 kg.  Patient continues to diurese with improvement but remains mildly dyspneic.  Continue present dose of diuretics and follow renal function.  2 cardiomyopathy-Continue metoprolol and Entresto.  This is felt secondary to mitral regurgitation or also may be tachycardia mediated related to uncontrolled atrial flutter.  3 atrial flutter-patient remains in atrial flutter.  He has been compliant with apixaban and has been on this medication for 30 days.  We will arrange cardioversion tomorrow or Tuesday as schedule allows.  We will then need to continue apixaban uninterrupted 4 weeks following procedure.  4 mitral valve regurgitation/possible  flail segment of posterior mitral valve leaflet-plan is for transesophageal echocardiogram prior to cardioversion to assess mitral valve morphology and severity of mitral regurgitation.  If MR is severe would need right and left catheterization followed by mitral valve repair/maze procedure after 4 full weeks of anticoagulation following cardioversion.  I will keep n.p.o. after midnight in case TEE and cardioversion can be scheduled for tomorrow.  For questions or updates, please contact [place] Please consult [email or web address] for contact info under        Signed, Kirk Ruths, MD  01/01/2019, 7:32 AM

## 2019-01-02 DIAGNOSIS — I5023 Acute on chronic systolic (congestive) heart failure: Secondary | ICD-10-CM

## 2019-01-02 LAB — BASIC METABOLIC PANEL
Anion gap: 8 (ref 5–15)
BUN: 18 mg/dL (ref 8–23)
CO2: 31 mmol/L (ref 22–32)
CREATININE: 1.26 mg/dL — AB (ref 0.61–1.24)
Calcium: 8.6 mg/dL — ABNORMAL LOW (ref 8.9–10.3)
Chloride: 102 mmol/L (ref 98–111)
GFR calc Af Amer: >60 mL/min (ref >60)
GFR calc non Af Amer: >60 mL/min (ref >60)
Glucose, Bld: 118 mg/dL — ABNORMAL HIGH (ref 70–99)
Potassium: 3.6 mmol/L (ref 3.5–5.1)
Sodium: 141 mmol/L (ref 135–145)

## 2019-01-02 MED ORDER — SODIUM CHLORIDE 0.9% FLUSH
3.0000 mL | Freq: Two times a day (BID) | INTRAVENOUS | Status: DC
  Administered 2019-01-03 – 2019-01-04 (×2): 3 mL via INTRAVENOUS

## 2019-01-02 MED ORDER — SODIUM CHLORIDE 0.9 % IV SOLN: INTRAVENOUS | Status: DC

## 2019-01-02 MED ORDER — SODIUM CHLORIDE 0.9 % IV SOLN
250.0000 mL | INTRAVENOUS | Status: DC
  Administered 2019-01-02: 250 mL via INTRAVENOUS

## 2019-01-02 MED ORDER — SODIUM CHLORIDE 0.9% FLUSH: 3.0000 mL | INTRAVENOUS | Status: DC | PRN

## 2019-01-02 NOTE — Progress Notes (Addendum)
Progress Note  Patient Name: [name] Date of Encounter: 01/02/2019  Primary Cardiologist: Quay Burow, MD   Subjective   No major complaints this morning. Overall dyspnea has improved. He can speak in full sentences w/o feeling winded. He has noticed significant improvement with less peripheral edema. Right leg is nearly normal. LLE still with edema (some degree of chronic LLE edema s/p ankle fusion surgery). He also noted abdominal distension has resolved. Nearly 25 lb weight loss. Denies CP. Only symptoms prior to admit were dyspnea, exertional fatigue and edema.   Inpatient Medications    Scheduled Meds: . apixaban  5 mg Oral BID  . doxazosin  4 mg Oral QHS  . furosemide  40 mg Intravenous BID  . metoprolol tartrate  100 mg Oral BID  . potassium chloride  10 mEq Oral BID  . sacubitril-valsartan  1 tablet Oral BID  . spironolactone  25 mg Oral Daily   Continuous Infusions:  PRN Meds: ALPRAZolam   Vital Signs    Vitals:   01/01/19 1131 01/01/19 1950 01/02/19 0441 01/02/19 0910  BP: 105/82 110/90 120/87 114/90  Pulse: (!) 105 (!) 126 (!) 121 (!) 124  Resp: 20 20 20 18   Temp: 98.9 F (37.2 C) 98.2 F (36.8 C) 98 F (36.7 C) 98 F (36.7 C)  TempSrc: Oral Oral Oral Oral  SpO2: 95% 95% 95% 96%  Weight:   118 kg   Height:        Intake/Output Summary (Last 24 hours) at 01/02/2019 1158 Last data filed at 01/02/2019 1058 Gross per 24 hour  Intake 240 ml  Output 4535 ml  Net -4295 ml   Last 3 Weights 01/02/2019 01/01/2019 12/31/2018  Weight (lbs) 260 lb 3.2 oz 267 lb 3.2 oz 273 lb 6.4 oz  Weight (kg) 118.026 kg 121.201 kg 124.013 kg      Telemetry    Atrial flutter in the low 100s - 110s, asymptomatic at rest.  - Personally Reviewed  ECG    Atrial flutter - Personally Reviewed  Physical Exam   GEN: middle aged WM, in No acute distress.   Neck: No JVD Cardiac: irregular rhythm, tachy rate, MR murmur noted, loudest at the apex Respiratory: decreased BS  at the bases bilaterally  GI: Soft, nontender, non-distended  MS: 2+ LLE pitting edema Neuro:  Nonfocal  Psych: Normal affect   Labs    Chemistry Recent Labs  Lab 12/31/18 0643 01/01/19 0359 01/02/19 0434  NA 140 141 141  K 3.9 3.7 3.6  CL 101 102 102  CO2 29 32 31  GLUCOSE 107* 106* 118*  BUN 25* 22 18  CREATININE 1.13 1.23 1.26*  CALCIUM 8.7* 8.7* 8.6*  GFRNONAA >60 >60 >60  GFRAA >60 >60 >60  ANIONGAP 10 7 8      Hematology Recent Labs  Lab 12/30/18 1558  WBC 6.4  RBC 4.69  HGB 13.0  HCT 42.7  MCV 91.0  MCH 27.7  MCHC 30.4  RDW 16.9*  PLT 150    Cardiac Enzymes Recent Labs  Lab 12/30/18 1558  TROPONINI <0.03   No results for input(s): TROPIPOC in the last 168 hours.   BNP Recent Labs  Lab 12/30/18 1558  BNP 1,365.8*     DDimer No results for input(s): DDIMER in the last 168 hours.   Radiology    No results found.  Cardiac Studies   2D Echo 12/09/18 IMPRESSIONS    1. The left ventricle has moderately reduced systolic function  of 35-40%. The cavity size was severely increased. There is mildly increased left ventricular wall thickness. Left ventricular diastology could not be evaluated Left ventricular diffuse  hypokinesis.  2. The right ventricle has moderately reduced systolic function. The cavity was normal. There is no increase in right ventricular wall thickness.  3. Left atrial size was severely dilated.  4. Right atrial size was severely dilated.  5. Possible flail segement of posterior MV leaflet. Mitral valve regurgitation is moderate by color flow Doppler.  6. The tricuspid valve is normal in structure.  7. The aortic valve is tricuspid There is mild thickening of the aortic valve.  8. The pulmonic valve was normal in structure.  9. There is mild dilatation of the aortic root and ascending aorta. 10. Moderate global reduction in LV systolic function; severe LVE; severe biatrial enlargement; moderate RV dysfunction; possible flail  segment of posterior MV leaflet; eccentric MR difficult to quantitate; suggest TEE to further assess.  Patient Profile     63 y.o. male with past medical history of mitral valve prolapse/mitral regurgitation, recent onset atrial fibrillation/flutter admitted with acute combined systolic/diastolic congestive heart failure.  Most recent echocardiogram December 09, 2018 showed ejection fraction 35 to 40% (60-65% in June 2019), moderate RV dysfunction, biatrial enlargement, possible flail segment of posterior mitral valve leaflet with at least moderate mitral regurgitation, TEE recommended. Not yet performed.   Assessment & Plan    1. Acute Combined Systolic and Diastolic CHF: good response to diuretics. -5L out yesterday. Net I/Os since admit -11 L. Over 20 lb weight loss. Resting dyspnea resolved. Abdominal distention resolved but still with LEE on exam, L>R. Needs additional diuresis. Continue IV Lasix. Continue to monitor renal function and electrolytes. Low salt diet.   2. Cardiomyopathy: newly reduced LVEF. Prior echo in June 2019 showed normal LVEF at 60-65%. Recent repeat echo done 12/09/2018 showed reduced LVEF at 35-40%. This is also in the setting of significant mitral regurgitation. He will need a TEE and possibly R/LHC for further w/u. Continue medical therapy w/ Entresto, spironolactone and metoprolol.   3. Atrial Flutter: He has been compliant with apixaban and has been on this medication for 30 days.  We will plan TEE DCCV prior to discharge. Will try to arrange for tomorrow. We will then need to continue apixaban uninterrupted 4 weeks following procedure.  4. Mitral Valve Regurgitation/ Possible Flail Segment of Posterior Mitral Valve Leaflet: plan is for transesophageal echocardiogram prior to cardioversion to assess mitral valve morphology and severity of mitral regurgitation.  If MR is severe would need right and left catheterization followed by mitral valve repair/maze procedure after 4  full weeks of anticoagulation following cardioversion. Unable to do TEE today due to schedule. Will try to arrange for tomorrow.   5. Renal Insuffiencey: slight bump in SCr from 1.13>>1.23>>1.26. Will monitor while diuresing.   6. Dilated Aortic Root/Ascending Aorta: mild dilation measuring 39 mm on recent echo. AV is tricuspid. No aortic pathology. Can be followed yearly by Dr. Gwenlyn Found.    For questions or updates, please contact [place] Please consult [email or web address] for contact info under        Signed, Lyda Jester, PA-C  01/02/2019, 11:58 AM     Attending Note:   The patient was seen and examined.  Agree with assessment and plan as noted above.  Changes made to the above note as needed.  Patient seen and independently examined with Lyda Jester, PA .   We discussed all aspects of the  encounter. I agree with the assessment and plan as stated above.  1.  Acute on chronic combined systolic and diastolic congestive heart failure: She has diuresed well.  He is doing quite well on IV Lasix.  The oral Lasix stopped working for him as an outpatient. Continue diuresis here in the hospital.  As an outpatient I would suggest that we use torsemide instead of furosemide as I think this will work better.  2.  Mitral regurgitation I am not able to bring up the images of his last echocardiogram.  He has had mitral valve prolapse for years and was thought to have moderate mitral regurgitation.  He now was thought to have a partially flail mitral valve leaflet.  He is scheduled to have a TEE and a cardioversion tomorrow.  3.  Atrial flutter: The patient is in persistent atrial flutter.  He has been on Eliquis for least a month or so he can go ahead and have his cardioversion even if were not able to get the TEE tomorrow.   I have spent a total of 40 minutes with patient reviewing hospital  notes , telemetry, EKGs, labs and examining patient as well as establishing an assessment and plan  that was discussed with the patient. > 50% of time was spent in direct patient care.    Thayer Headings, Brooke Bonito., MD, [facility] 01/02/2019, 12:57 PM [address],  [place] Pager [phone]

## 2019-01-02 NOTE — Care Management Note (Signed)
Case Management Note  Patient Details  Name: [name] MRN: [id number] Date of Birth: 08-Jan-1956  Subjective/Objective:   Hypertensive Urgency                Action/Plan: Patient lives at home; Primary Care Provider: [name], MD; has private insurance with [facility] with prescription drug coverage; CM will continue to follow for progression of care.  Expected Discharge Date:   possibly 01/05/2019               Expected Discharge Plan:  Home/Self Care  Discharge planning Services  CM Consult  Status of Service:  In process, will continue to follow  Sherrilyn Rist [phone] 01/02/2019, 12:08 PM

## 2019-01-03 ENCOUNTER — Inpatient Hospital Stay (HOSPITAL_COMMUNITY): Payer: BLUE CROSS/BLUE SHIELD | Admitting: Anesthesiology

## 2019-01-03 ENCOUNTER — Inpatient Hospital Stay (HOSPITAL_COMMUNITY): Payer: BLUE CROSS/BLUE SHIELD

## 2019-01-03 ENCOUNTER — Encounter (HOSPITAL_COMMUNITY)
Admission: EM | Disposition: A | Discharge status: Home / Self Care | Payer: Self-pay | Source: Ambulatory Visit | Attending: Cardiovascular Disease

## 2019-01-03 ENCOUNTER — Encounter (HOSPITAL_COMMUNITY): Payer: Self-pay | Admitting: *Deleted

## 2019-01-03 ENCOUNTER — Other Ambulatory Visit: Payer: Self-pay

## 2019-01-03 DIAGNOSIS — I340 Nonrheumatic mitral (valve) insufficiency: Secondary | ICD-10-CM

## 2019-01-03 DIAGNOSIS — I4892 Unspecified atrial flutter: Secondary | ICD-10-CM | POA: Diagnosis present

## 2019-01-03 DIAGNOSIS — I341 Nonrheumatic mitral (valve) prolapse: Secondary | ICD-10-CM | POA: Diagnosis present

## 2019-01-03 HISTORY — PX: TEE WITHOUT CARDIOVERSION: SHX5443

## 2019-01-03 HISTORY — PX: CARDIOVERSION: SHX1299

## 2019-01-03 LAB — COMPREHENSIVE METABOLIC PANEL
ALBUMIN: 2.9 g/dL — AB (ref 3.5–5.0)
ALT: 18 U/L (ref 0–44)
AST: 26 U/L (ref 15–41)
Alkaline Phosphatase: 40 U/L (ref 38–126)
Anion gap: 9 (ref 5–15)
BUN: 22 mg/dL (ref 8–23)
CO2: 32 mmol/L (ref 22–32)
Calcium: 8.8 mg/dL — ABNORMAL LOW (ref 8.9–10.3)
Chloride: 100 mmol/L (ref 98–111)
Creatinine, Ser: 1.18 mg/dL (ref 0.61–1.24)
GFR calc Af Amer: >60 mL/min (ref >60)
GFR calc non Af Amer: >60 mL/min (ref >60)
Glucose, Bld: 112 mg/dL — ABNORMAL HIGH (ref 70–99)
POTASSIUM: 4 mmol/L (ref 3.5–5.1)
Sodium: 141 mmol/L (ref 135–145)
Total Bilirubin: 1 mg/dL (ref 0.3–1.2)
Total Protein: 5 g/dL — ABNORMAL LOW (ref 6.5–8.1)

## 2019-01-03 SURGERY — ECHOCARDIOGRAM, TRANSESOPHAGEAL
Anesthesia: Monitor Anesthesia Care

## 2019-01-03 MED ORDER — PHENYLEPHRINE 40 MCG/ML (10ML) SYRINGE FOR IV PUSH (FOR BLOOD PRESSURE SUPPORT)
PREFILLED_SYRINGE | INTRAVENOUS | Status: DC | PRN
  Administered 2019-01-03 (×2): 80 ug via INTRAVENOUS
  Administered 2019-01-03: 120 ug via INTRAVENOUS

## 2019-01-03 MED ORDER — IOPAMIDOL (ISOVUE-370) INJECTION 76%
100.0000 mL | Freq: Once | INTRAVENOUS | Status: AC | PRN
  Administered 2019-01-03: 100 mL via INTRAVENOUS

## 2019-01-03 MED ORDER — AMIODARONE HCL 200 MG PO TABS
200.0000 mg | ORAL_TABLET | Freq: Two times a day (BID) | ORAL | Status: DC
  Administered 2019-01-03 – 2019-01-04 (×2): 200 mg via ORAL
  Filled 2019-01-03 (×2): qty 1

## 2019-01-03 MED ORDER — PROPOFOL 500 MG/50ML IV EMUL
INTRAVENOUS | Status: DC | PRN
  Administered 2019-01-03: 100 ug/kg/min via INTRAVENOUS

## 2019-01-03 MED ORDER — LACTATED RINGERS IV SOLN
INTRAVENOUS | Status: DC | PRN
  Administered 2019-01-03: 08:00:00 via INTRAVENOUS

## 2019-01-03 MED ORDER — TORSEMIDE 20 MG PO TABS: 40.0000 mg | ORAL_TABLET | Freq: Every day | ORAL | Status: DC

## 2019-01-03 MED ORDER — TORSEMIDE 20 MG PO TABS
20.0000 mg | ORAL_TABLET | Freq: Every day | ORAL | Status: DC
  Administered 2019-01-03 – 2019-01-04 (×2): 20 mg via ORAL
  Filled 2019-01-03 (×2): qty 1

## 2019-01-03 MED ORDER — SACUBITRIL-VALSARTAN 24-26 MG PO TABS
1.0000 | ORAL_TABLET | Freq: Two times a day (BID) | ORAL | Status: DC
  Administered 2019-01-03 – 2019-01-04 (×2): 1 via ORAL
  Filled 2019-01-03 (×3): qty 1

## 2019-01-03 MED ORDER — PROPOFOL 10 MG/ML IV BOLUS
INTRAVENOUS | Status: DC | PRN
  Administered 2019-01-03 (×3): 20 mg via INTRAVENOUS

## 2019-01-03 NOTE — CV Procedure (Signed)
    PROCEDURE NOTE:  Procedure:  Transesophageal echocardiogram Operator:  Fransico Him, MD Indications:  Atrial fibrillation Complications: None  During this procedure the patient is administered a total of Propofol 350 mg to achieve and maintain moderate conscious sedation.  The patient's heart rate, blood pressure, and oxygen saturation are monitored continuously during the procedure by anesthesia.   Results: Normal LV size with moderately reduced LV function with EF 30-35% Normal RV size and moderately reduced function Severely dilated RA with spontaneous echo contrast. Severely dilated LA with spontaneous echo contrast.  No evidence of LA or LAA thrombus.  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 Normal trileaflet AV Normal interatrial septum with no evidence of shunt by colorflow dopper  Normal thoracic and ascending aorta.  The patient tolerated the procedure well and went on to DCCV  Signed: Fransico Him, MD [facility]   Electrical Cardioversion Procedure Note [name] [id number] [birth year]  Procedure: Electrical Cardioversion Indications:  Atrial Fibrillation  Time Out: Verified patient identification, verified procedure,medications/allergies/relevent history reviewed, required imaging and test results available.  Performed  Procedure Details  Cardioversion was done with synchronized biphasic defibrillation with AP pads with 150watts.  The patient converted to normal sinus rhythm. The patient tolerated the procedure well   IMPRESSION:  Successful cardioversion of atrial fibrillation  Patient has severe snoring with  oxygen desaturations during sleep - recommend sleeps study.     [name] 01/03/2019, 8:00 AM

## 2019-01-03 NOTE — Progress Notes (Signed)
Received update from Dr. Acie Fredrickson regarding plan. Pt was seen by CT surgery. Plan is for additional imaging per Dr. Roxy Manns. Pt will likely be discharged home tomorrow. He has been transition from IV to PO diuretics. He is s/p DCCV this morning. To help keep in NSR, we will add low PO amiodarone, 200 mg BID. Will start first dose tonight.   Lyda Jester, PA-C

## 2019-01-03 NOTE — Interval H&P Note (Signed)
History and Physical Interval Note:  01/03/2019 7:59 AM  [name]  has presented today for surgery, with the diagnosis of a fib  The various methods of treatment have been discussed with the patient and family. After consideration of risks, benefits and other options for treatment, the patient has consented to  Procedure(s): TRANSESOPHAGEAL ECHOCARDIOGRAM (TEE) (N/A) CARDIOVERSION (N/A) as a surgical intervention .  The patient's history has been reviewed, patient examined, no change in status, stable for surgery.  I have reviewed the patient's chart and labs.  Questions were answered to the patient's satisfaction.     Fransico Him

## 2019-01-03 NOTE — Anesthesia Preprocedure Evaluation (Signed)
Anesthesia Evaluation  Patient identified by MRN, date of birth, ID band Patient awake    Reviewed: Allergy & Precautions, NPO status , Patient's Chart, lab work & pertinent test results, reviewed documented beta blocker date and time   History of Anesthesia Complications Negative for: history of anesthetic complications  Airway Mallampati: II  TM Distance: >3 FB Neck ROM: Full    Dental  (+) Dental Advisory Given, Chipped,    Pulmonary neg pulmonary ROS,    Pulmonary exam normal breath sounds clear to auscultation       Cardiovascular hypertension, Pt. on medications and Pt. on home beta blockers +CHF  Normal cardiovascular exam+ dysrhythmias (on Apixaban) Atrial Fibrillation + Valvular Problems/Murmurs MR  Rhythm:Irregular Rate:Normal  TTTE 12/09/18: EF 35-40%, diffuse hypokinesis, moderately reduced RV systolic function, severe LAE, severe RAE, possible flail segement of posterior MV leaflet, moderate MVR   Neuro/Psych Seizures - (34yr ago), Well Controlled,  Anxiety    GI/Hepatic Neg liver ROS, GERD  Controlled,  Endo/Other  negative endocrine ROS  Renal/GU negative Renal ROS     Musculoskeletal  (+) Arthritis ,   Abdominal   Peds  Hematology negative hematology ROS (+)   Anesthesia Other Findings Day of surgery medications reviewed with the patient.  Reproductive/Obstetrics                             Anesthesia Physical Anesthesia Plan  ASA: III  Anesthesia Plan: MAC   Post-op Pain Management:    Induction:   PONV Risk Score and Plan: Treatment may vary due to age or medical condition and Propofol infusion  Airway Management Planned: Natural Airway and Nasal Cannula  Additional Equipment:   Intra-op Plan:   Post-operative Plan:   Informed Consent: I have reviewed the patients History and Physical, chart, labs and discussed the procedure including the risks, benefits and  alternatives for the proposed anesthesia with the patient or authorized representative who has indicated his/her understanding and acceptance.     Dental advisory given  Plan Discussed with: CRNA  Anesthesia Plan Comments:         Anesthesia Quick Evaluation

## 2019-01-03 NOTE — Progress Notes (Signed)
Progress Note  Patient Name: [name] Date of Encounter: 01/03/2019  Primary Cardiologist: Quay Burow, MD   Subjective   [name] is a 63 year old gentleman with a long history of mitral valve prolapse.  He recently developed congestive heart failure and atrial fibrillation.  Echocardiogram was suggestive of a partially flail mitral valve leaflet. Esophageal echocardiogram this morning confirms the presence of a partially flail leaflet likely due to a ruptured chordae. Successfully cardioverted.  His heart rate was 120 yesterday and now is in the 80s.  Feeling well after cardioversion. .   Inpatient Medications    Scheduled Meds: . apixaban  5 mg Oral BID  . doxazosin  4 mg Oral QHS  . furosemide  40 mg Intravenous BID  . metoprolol tartrate  100 mg Oral BID  . potassium chloride  10 mEq Oral BID  . sacubitril-valsartan  1 tablet Oral BID  . sodium chloride flush  3 mL Intravenous Q12H  . spironolactone  25 mg Oral Daily   Continuous Infusions: . sodium chloride Stopped (01/02/19 2009)   PRN Meds: ALPRAZolam, [MAR Hold] sodium chloride flush   Vital Signs    Vitals:   01/03/19 0737 01/03/19 0852 01/03/19 0900 01/03/19 0908  BP: (!) 124/98 96/65 (!) 135/115 100/79  Pulse: (!) 125 84 85 87  Resp: (!) 30 (!) 25 (!) 26 (!) 27  Temp: 97.9 F (36.6 C) 98.4 F (36.9 C)    TempSrc: Oral Oral    SpO2: 96% 94% 91% 93%  Weight:      Height:        Intake/Output Summary (Last 24 hours) at 01/03/2019 0939 Last data filed at 01/03/2019 0839 Gross per 24 hour  Intake 1264.36 ml  Output 3950 ml  Net -2685.64 ml   Last 3 Weights 01/03/2019 01/02/2019 01/01/2019  Weight (lbs) 255 lb 3.2 oz 260 lb 3.2 oz 267 lb 3.2 oz  Weight (kg) 115.758 kg 118.026 kg 121.201 kg      Telemetry    NSR    ECG    NSR  / ectopic atrial rhythm -  Personally Reviewed  Physical Exam   GEN: middle aged WM, in No acute distress.   Neck: No JVD Cardiac:  RR , 8-2/4 systolic murmur  radiating to the axilla   Respiratory: decreased BS at the bases bilaterally  GI: Soft, nontender, non-distended  MS: 1 + pitting edem a Neuro:  Nonfocal  Psych: Normal affect   Labs    Chemistry Recent Labs  Lab 01/01/19 0359 01/02/19 0434 01/03/19 0429  NA 141 141 141  K 3.7 3.6 4.0  CL 102 102 100  CO2 32 31 32  GLUCOSE 106* 118* 112*  BUN 22 18 22   CREATININE 1.23 1.26* 1.18  CALCIUM 8.7* 8.6* 8.8*  PROT  --   --  5.0*  ALBUMIN  --   --  2.9*  AST  --   --  26  ALT  --   --  18  ALKPHOS  --   --  40  BILITOT  --   --  1.0  GFRNONAA >60 >60 >60  GFRAA >60 >60 >60  ANIONGAP 7 8 9      Hematology Recent Labs  Lab 12/30/18 1558  WBC 6.4  RBC 4.69  HGB 13.0  HCT 42.7  MCV 91.0  MCH 27.7  MCHC 30.4  RDW 16.9*  PLT 150    Cardiac Enzymes Recent Labs  Lab 12/30/18 1558  TROPONINI <0.03  No results for input(s): TROPIPOC in the last 168 hours.   BNP Recent Labs  Lab 12/30/18 1558  BNP 1,365.8*     DDimer No results for input(s): DDIMER in the last 168 hours.   Radiology    No results found.  Cardiac Studies   2D Echo 12/09/18 IMPRESSIONS    1. The left ventricle has moderately reduced systolic function of 15-72%. The cavity size was severely increased. There is mildly increased left ventricular wall thickness. Left ventricular diastology could not be evaluated Left ventricular diffuse  hypokinesis.  2. The right ventricle has moderately reduced systolic function. The cavity was normal. There is no increase in right ventricular wall thickness.  3. Left atrial size was severely dilated.  4. Right atrial size was severely dilated.  5. Possible flail segement of posterior MV leaflet. Mitral valve regurgitation is moderate by color flow Doppler.  6. The tricuspid valve is normal in structure.  7. The aortic valve is tricuspid There is mild thickening of the aortic valve.  8. The pulmonic valve was normal in structure.  9. There is mild  dilatation of the aortic root and ascending aorta. 10. Moderate global reduction in LV systolic function; severe LVE; severe biatrial enlargement; moderate RV dysfunction; possible flail segment of posterior MV leaflet; eccentric MR difficult to quantitate; suggest TEE to further assess.  Patient Profile     63 y.o. male with past medical history of mitral valve prolapse/mitral regurgitation, recent onset atrial fibrillation/flutter admitted with acute combined systolic/diastolic congestive heart failure.  Most recent echocardiogram December 09, 2018 showed ejection fraction 35 to 40% (60-65% in June 2019), moderate RV dysfunction, biatrial enlargement, possible flail segment of posterior mitral valve leaflet with at least moderate mitral regurgitation, TEE recommended. Not yet performed.   Assessment & Plan    1. Acute Combined Systolic and Diastolic CHF: He has had a nice response to the IV Lasix.  We will change him to p.o. torsemide today. Continue on Entresto.  2. Cardiomyopathy:  His ejection fraction is somewhat lower than it was previously.   His heart rate was 118 during that procedure.  Is possible that this cardiomyopathy was partially due to a rate related cardiomyopathy. Denies any chest pain. New medical therapy.  He will need a right and left heart catheterization with mitral valve repair/maze procedure in the near future.  3. Atrial Flutter:  He was successfully cardioverted into sinus rhythm today.  His rhythm actually looks like it could be an ectopic atrial rhythm.  His heart rate is clearly better than it was previously.  4. Mitral Valve Regurgitation/ Possible Flail Segment of Posterior Mitral Valve Leaflet: TEE shows partial flail leaflet.  I have consulted Dr. Roxy Manns. Discuss further plans with him today and tomorrow.   6. Dilated Aortic Root/Ascending Aorta: mild dilation measuring 39 mm on recent echo. AV is tricuspid. No aortic pathology. Can be followed yearly by Dr.  Gwenlyn Found.     Mertie Moores, MD  01/03/2019 9:50 AM    [facility] [address],  [place], [place]  [address] Pager [phone] Phone: [phone]; Fax: [phone]

## 2019-01-03 NOTE — Consult Note (Signed)
[address]       [place] [address]             [phone]          CARDIOTHORACIC SURGERY CONSULTATION REPORT  PCP is [name], MD Referring Provider is Thayer Headings, MD Primary Cardiologist is Quay Burow, MD  Reason for consultation:  Mitral valve prolapse with severe mitral regurgitation and atrial fibrillation  HPI:  Patient is a 63 year old male with known history of mitral valve prolapse and mitral regurgitation, hypertension, and degenerative arthritis who was admitted to the hospital in rapid atrial fibrillation and atrial flutter with decompensated acute on chronic combined systolic and diastolic congestive heart failure and has been referred for surgical consultation to discuss treatment options for management of mitral regurgitation and atrial fibrillation.  Patient states that he was first noted to have a heart murmur on physical exam by his primary care physician more than 10 years ago.  He was diagnosed with mitral valve prolapse and has been followed intermittently by Dr. Gwenlyn Found ever since.  Echocardiogram performed June 2019 revealed severe mitral valve prolapse with moderate mitral regurgitation.  Left ventricular systolic function was normal with ejection fraction estimated 60 to 65%.  Several months ago the patient began to experience increased fatigue, exertional shortness of breath, anorexia, and progressive weight gain with worsening lower extremity edema.  He was evaluated by his primary care physician and notably tachycardic and fluid overloaded.  He was started on oral diuretics and referred to Dr. Alvester Chou who saw him December 02, 2018.  At that time he was in rapid atrial fibrillation with heart rate 130.  He was started on Eliquis and metoprolol dose increased.  A follow-up echocardiogram was performed December 09, 2018 which revealed moderate to severe left ventricular systolic dysfunction with ejection fraction estimated only 35 to  40%.  There was felt to be moderate mitral regurgitation although question of flail segment of the posterior leaflet.  He returned to see Dr. Gwenlyn Found in follow-up last week and had gained another 20 pounds in weight and appeared short of breath in acutely decompensated congestive heart failure.  He was sent directly to the hospital for admission where he was found to be in atrial flutter.  He responded well to intravenous diuresis and underwent TEE with DC cardioversion.  TEE confirmed the presence of mitral valve prolapse with ruptured chordae tendinae and a flail segment of the posterior leaflet.  There is severe mitral regurgitation.  Left ventricular ejection fraction was estimated 30 to 35%.  There is moderately reduced right ventricular function.  Severely dilated left and right atrium.  No left atrial thrombus.  The aortic valve was trileaflet and otherwise normal.  The patient underwent cardioversion back into sinus rhythm.  Cardiothoracic surgical consultation was requested.  Patient is married and lives locally in [place] with his wife.  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  He does not exercise on a regular basis.  He has had problems with degenerative arthritis which ultimately led to right hip replacement and bilateral total knee replacements.  However, he recovered from those procedures quite well and reports no physical limitations recently.  Several months ago he had problems with pain in his right leg which ultimately was attributed to a cyst in his spine that was successfully injected.  He reports that immediately prior to hospital admission he was short of breath with low-level activity and occasionally at  rest.  He had severe lower extremity edema.  All of this has improved considerably over the last few days.  He has never had any chest pain or chest tightness.  He has not had any dizzy spells or syncope.  He has recently had a dry nonproductive cough.  Past  Medical History:  Diagnosis Date  . Acute on chronic systolic (congestive) heart failure ([facility]) 12/30/2018  . Allergy   . Anxiety   . Arthritis   . Atrial flutter with rapid ventricular response ([facility])   . Chronic systolic (congestive) heart failure ([facility])   . GERD (gastroesophageal reflux disease)   . Heart murmur   . History of colon polyps   . Hypertension   . Insomnia   . Mitral valve prolapse   . Mitral valve regurgitation   . Persistent atrial fibrillation 12/02/2018  . Seizures ([facility])    had one approx. 30 yrs ago,has not had any since  . Severe mitral regurgitation     Past Surgical History:  Procedure Laterality Date  . ANKLE FUSION  left   Dec. 2012  . ARTHROSCOPY KNEE W/ DRILLING  bilateral   2012  . COLONOSCOPY     x2  . FOOT ARTHRODESIS  06/23/2012   Procedure: ARTHRODESIS FOOT;  Surgeon: Wylene Simmer, MD;  Location: [facility];  Service: Orthopedics;  Laterality: Left;  Left Subtalar and Talonavicular Joint Revision Arthrodesis  Aspiration of Bone Marrow from Left Hip   . HAIR TRANSPLANT    . HARDWARE REMOVAL  06/23/2012   Procedure: HARDWARE REMOVAL;  Surgeon: Wylene Simmer, MD;  Location: [facility];  Service: Orthopedics;  Laterality: Left;  Removal of Deep Implant  X's 3  . INSERTION OF MESH N/A 07/10/2016   Procedure: INSERTION OF MESH;  Surgeon: Coralie Keens, MD;  Location: [facility];  Service: General;  Laterality: N/A;  . JOINT REPLACEMENT     right hip  01-2011  . LIMB SPARING RESECTION HIP W/ SADDLE JOINT REPLACEMENT Right   . TOTAL KNEE ARTHROPLASTY Right 01/19/2014   Procedure: RIGHT TOTAL KNEE ARTHROPLASTY, Steroid injection left knee;  Surgeon: Mcarthur Rossetti, MD;  Location: [facility];  Service: Orthopedics;  Laterality: Right;  . TOTAL KNEE ARTHROPLASTY Left 10/23/2014   Procedure: LEFT TOTAL KNEE ARTHROPLASTY;  Surgeon: Mcarthur Rossetti, MD;  Location: [facility];  Service: Orthopedics;  Laterality: Left;  . UMBILICAL HERNIA REPAIR N/A  07/10/2016   Procedure: UMBILICAL HERNIA REPAIR;  Surgeon: Coralie Keens, MD;  Location: [facility];  Service: General;  Laterality: N/A;    Family History  Problem Relation Age of Onset  . Hypertension Father   . Colon cancer Father   . Diabetes Mother   . Stomach cancer Neg Hx   . Esophageal cancer Neg Hx   . Pancreatic cancer Neg Hx   . Rectal cancer Neg Hx     Social History   Socioeconomic History  . Marital status: Married    Spouse name: Not on file  . Number of children: Not on file  . Years of education: Not on file  . Highest education level: Not on file  Occupational History  . Not on file  Social Needs  . Financial resource strain: Not on file  . Food insecurity:    Worry: Not on file    Inability: Not on file  . Transportation needs:    Medical: Not on file    Non-medical: Not on file  Tobacco Use  .  Smoking status: Never Smoker  . Smokeless tobacco: Never Used  Substance and Sexual Activity  . Alcohol use: Yes    Comment: 1 drink daily  . Drug use: No  . Sexual activity: Yes  Lifestyle  . Physical activity:    Days per week: Not on file    Minutes per session: Not on file  . Stress: Not on file  Relationships  . Social connections:    Talks on phone: Not on file    Gets together: Not on file    Attends religious service: Not on file    Active member of club or organization: Not on file    Attends meetings of clubs or organizations: Not on file    Relationship status: Not on file  . Intimate partner violence:    Fear of current or ex partner: Not on file    Emotionally abused: Not on file    Physically abused: Not on file    Forced sexual activity: Not on file  Other Topics Concern  . Not on file  Social History Narrative  . Not on file    Prior to Admission medications   Medication Sig Start Date End Date Taking? Authorizing Provider  ALPRAZolam Duanne Moron) 0.5 MG tablet Take 0.5 mg by mouth at bedtime as needed for anxiety.    Yes [provider]  apixaban (ELIQUIS) 5 MG TABS tablet Take 1 tablet (5 mg total) by mouth 2 (two) times daily. 12/02/18  Yes Lorretta Harp, MD  CARTIA XT 240 MG 24 hr capsule Take 240 mg by mouth daily. 11/30/18  Yes [provider]  Docusate Sodium (COLACE PO) Take 3 capsules by mouth every morning. Over the counter    Yes [provider]  doxazosin (CARDURA) 4 MG tablet Take 4 mg by mouth at bedtime.    Yes [provider]  furosemide (LASIX) 40 MG tablet Take 1 tablet (40 mg total) by mouth daily. 12/20/18 03/20/19 Yes Kilroy, Luke K, PA-C  lisinopril (PRINIVIL,ZESTRIL) 20 MG tablet Take 10 mg by mouth every morning.    Yes [provider]  metoprolol tartrate (LOPRESSOR) 50 MG tablet Take 1 tablet (50 mg total) by mouth 2 (two) times daily. 12/16/18 03/16/19 Yes Lorretta Harp, MD  Multiple Vitamins-Minerals (MULTIVITAMIN PO) Take 1 tablet by mouth daily.   Yes [provider]  naproxen sodium (ANAPROX) 220 MG tablet Take 440 mg by mouth 2 (two) times daily with a meal.    Yes [provider]  OVER THE COUNTER MEDICATION Sleep aid - diphenhydramine   Yes [provider]  potassium chloride (K-DUR) 10 MEQ tablet Take 1 tablet (10 mEq total) by mouth 2 (two) times daily. 12/20/18 03/20/19 Yes Kilroy, Doreene Burke, PA-C    Current Facility-Administered Medications  Medication Dose Route Frequency Provider Last Rate Last Dose  . 0.9 %  sodium chloride infusion  250 mL Intravenous Continuous Consuelo Pandy, PA-C   Stopped at 01/02/19 2009  . ALPRAZolam Duanne Moron) tablet 0.5 mg  0.5 mg Oral QHS PRN Croitoru, Mihai, MD   0.5 mg at 01/02/19 2117  . apixaban (ELIQUIS) tablet 5 mg  5 mg Oral BID Croitoru, Mihai, MD   5 mg at 01/03/19 0655  . doxazosin (CARDURA) tablet 4 mg  4 mg Oral QHS Croitoru, Mihai, MD   4 mg at 01/02/19 2116  . metoprolol tartrate (LOPRESSOR) tablet 100 mg  100 mg Oral BID Croitoru, Mihai, MD   100 mg at  01/03/19 1009  . potassium chloride (K-DUR,KLOR-CON) CR tablet 10 mEq  10 mEq Oral BID Croitoru, Mihai, MD   10 mEq at 01/03/19 1009  . sacubitril-valsartan (ENTRESTO) 24-26 mg per tablet  1 tablet Oral BID Hammons, Kimberly B, RPH      . sodium chloride flush (NS) 0.9 % injection 3 mL  3 mL Intravenous Q12H Simmons, Brittainy M, PA-C      . sodium chloride flush (NS) 0.9 % injection 3 mL  3 mL Intravenous PRN Rosita Fire, Brittainy M, PA-C      . spironolactone (ALDACTONE) tablet 25 mg  25 mg Oral Daily Lelon Perla, MD   25 mg at 01/03/19 1008  . torsemide (DEMADEX) tablet 20 mg  20 mg Oral Daily Nahser, Wonda Cheng, MD   20 mg at 01/03/19 1224    No Known Allergies    Review of Systems:   General:  normal appetite, decreased energy, + weight gain, no weight loss, no fever  Cardiac:  no chest pain with exertion, no chest pain at rest, +SOB with exertion, + resting SOB, no PND, no orthopnea, + palpitations, + arrhythmia, + atrial fibrillation, + LE edema, no dizzy spells, no syncope  Respiratory:  + shortness of breath, no home oxygen, no productive cough, + dry cough, no bronchitis, no wheezing, no hemoptysis, no asthma, no pain with inspiration or cough, no sleep apnea, no CPAP at night  GI:   no difficulty swallowing, no reflux, no frequent heartburn, no hiatal hernia, no abdominal pain, no constipation, no diarrhea, no hematochezia, no hematemesis, no melena  GU:   no dysuria,  no frequency, no urinary tract infection, no hematuria, no enlarged prostate, no kidney stones, no kidney disease  Vascular:  no pain suggestive of claudication, no pain in feet, no leg cramps, no varicose veins, no DVT, no non-healing foot ulcer  Neuro:   no stroke, no TIA's, no seizures, no headaches, no temporary blindness one eye,  no slurred speech, no peripheral neuropathy, no chronic pain, no instability of gait, no memory/cognitive dysfunction  Musculoskeletal: + arthritis - primarily involving the lower back  and knees, no joint swelling, no myalgias, no difficulty walking, normal mobility   Skin:   no rash, no itching, no skin infections, no pressure sores or ulcerations  Psych:   no anxiety, no depression, no nervousness, no unusual recent stress  Eyes:   no blurry vision, no floaters, no recent vision changes, does not wear glasses or contacts  ENT:   no hearing loss, no loose or painful teeth, no dentures, last saw dentist within the past year  Hematologic:  no easy bruising, no abnormal bleeding, no clotting disorder, no frequent epistaxis  Endocrine:  no diabetes, does not check CBG's at home     Physical Exam:   BP (!) 86/72 (BP Location: Right Arm)   Pulse 82   Temp 98.2 F (36.8 C) (Oral)   Resp 18   Ht 6\' 3"  (1.905 m)   Wt 115.8 kg Comment: scale b  SpO2 99%   BMI 31.90 kg/m   General:  Mildly obese,  well-appearing  HEENT:  Unremarkable   Neck:   no JVD, no bruits, no adenopathy   Chest:   symmetrical breath sounds, no wheezes, no rhonchi , few crackles lung bases  CV:   RRR, grade III/VI systolic murmur   Abdomen:  soft, non-tender, no masses   Extremities:  warm, well-perfused, pulses diminished but palpable, no lower extremity edema  Rectal/GU  Deferred  Neuro:   Grossly non-focal and symmetrical throughout  Skin:   Clean and dry, no rashes, no breakdown  Diagnostic Tests:  Lab Results: No results for input(s): WBC, HGB, HCT, PLT in the last 72 hours. BMET:  Recent Labs    01/02/19 0434 01/03/19 0429  NA 141 141  K 3.6 4.0  CL 102 100  CO2 31 32  GLUCOSE 118* 112*  BUN 18 22  CREATININE 1.26* 1.18  CALCIUM 8.6* 8.8*    CBG (last 3)  No results for input(s): GLUCAP in the last 72 hours. PT/INR:  No results for input(s): LABPROT, INR in the last 72 hours.  CXR:  CHEST - 2 VIEW  COMPARISON:  01/15/2014  FINDINGS: Mild cardiomegaly. Normal vascularity. Low lung volumes. Bibasilar atelectasis. Small right pleural effusion. No  pneumothorax.  IMPRESSION: Cardiomegaly without decompensation.  Bibasilar atelectasis.  Small right pleural effusion.   Electronically Signed   By: Marybelle Killings M.D.   On: 12/30/2018 16:40    Echocardiography  Patient:    Kule, Gascoigne MR #:       [id number] Study Date: 04/27/2018 Gender:     M Age:        89 Height:     190.5 cm Weight:     118.8 kg BSA:        2.54 m^2 Pt. Status: Room:   ORDERING     Quay Burow, MD  REFERRING    Quay Burow, MD  ATTENDING    Candee Furbish, M.D.  PERFORMING   [facility], Outpatient  SONOGRAPHER  [facility], RDCS  cc:  ------------------------------------------------------------------- LV EF: 60% -   65%  ------------------------------------------------------------------- Indications:      Mitral Valve Insufficiency (I34.0).  ------------------------------------------------------------------- History:   PMH:   Murmur.  Risk factors:  Family history of coronary artery disease. Hypertension.  ------------------------------------------------------------------- Study Conclusions  - Left ventricle: The cavity size was normal. Wall thickness was   increased in a pattern of mild LVH. Systolic function was normal.   The estimated ejection fraction was in the range of 60% to 65%.   Wall motion was normal; there were no regional wall motion   abnormalities. Doppler parameters are consistent with abnormal   left ventricular relaxation (grade 1 diastolic dysfunction). - Aorta: Ascending aortic diameter: 41 mm (S). - Ascending aorta: The ascending aorta was mildly dilated. - Mitral valve: Severe prolapse, involving the posterior leaflet.   There was moderate regurgitation directed anteriorly. - Left atrium: The atrium was moderately dilated. Volume/bsa, S:   39.4 ml/m^2. - Right ventricle: The cavity size was mildly dilated. Wall   thickness was normal. - Right atrium: The atrium was mildly  dilated.  Impressions:  - Compared to the prior study, there has been no significant   interval change.  ------------------------------------------------------------------- Study data:  Comparison was made to the study of 04/09/2017.  Study status:  Routine.  Procedure:  Transthoracic echocardiography. Image quality was adequate.          Echocardiography.  M-mode, complete 2D, 3D, spectral Doppler, and color Doppler.  Birthdate: Patient birthdate: [birth year].  Age:  Patient is 63 yr old.  Sex: Gender: male.    BMI: 32.7 kg/m^2.  Blood pressure:     130/70 Patient status:  Outpatient.  Study date:  Study date: 04/27/2018. Study time: 11:39 AM.  Location:  [facility]  -------------------------------------------------------------------  ------------------------------------------------------------------- Left ventricle:  The cavity size was normal. Wall thickness was increased in  a pattern of mild LVH. Systolic function was normal. The estimated ejection fraction was in the range of 60% to 65%. Wall motion was normal; there were no regional wall motion abnormalities. Doppler parameters are consistent with abnormal left ventricular relaxation (grade 1 diastolic dysfunction).  ------------------------------------------------------------------- Aortic valve:   Trileaflet; normal thickness leaflets. Mobility was not restricted.  Doppler:  Transvalvular velocity was within the normal range. There was no stenosis. There was no regurgitation.   ------------------------------------------------------------------- Aorta:  Ascending aorta: The ascending aorta was mildly dilated.  ------------------------------------------------------------------- Mitral valve:  Mobility was not restricted.  Severe prolapse, involving the posterior leaflet.  Doppler:  Transvalvular velocity was within the normal range. There was no evidence for stenosis. There was moderate regurgitation directed  anteriorly.    Peak gradient (D): 3 mm Hg.  ------------------------------------------------------------------- Left atrium:  The atrium was moderately dilated.  ------------------------------------------------------------------- Right ventricle:  The cavity size was mildly dilated. Wall thickness was normal. Systolic function was normal.  ------------------------------------------------------------------- Pulmonic valve:   Poorly visualized.  Structurally normal valve. Cusp separation was normal.  Doppler:  Transvalvular velocity was within the normal range. There was no evidence for stenosis. There was no regurgitation.  ------------------------------------------------------------------- Tricuspid valve:   Structurally normal valve.    Doppler: Transvalvular velocity was within the normal range. There was no regurgitation.  ------------------------------------------------------------------- Pulmonary artery:   The main pulmonary artery was normal-sized. Systolic pressure was within the normal range.  ------------------------------------------------------------------- Right atrium:  The atrium was mildly dilated.  ------------------------------------------------------------------- Pericardium:  There was no pericardial effusion.  ------------------------------------------------------------------- Systemic veins: Inferior vena cava: The vessel was normal in size.  ------------------------------------------------------------------- Measurements   Left ventricle                         Value        Reference  LV ID, ED, PLAX chordal        (H)     62.1  mm     43 - 52  LV ID, ES, PLAX chordal        (H)     38.5  mm     23 - 38  LV fx shortening, PLAX chordal         38    %      >=29  LV PW thickness, ED                    10.9  mm     ----------  IVS/LV PW ratio, ED                    1.16         <=1.3  Stroke volume, 2D                      135   ml      ----------  Stroke volume/bsa, 2D                  53    ml/m^2 ----------  LV e&', lateral                         12.3  cm/s   ----------  LV E/e&', lateral                       6.94         ----------  LV e&',  medial                          8.7   cm/s   ----------  LV E/e&', medial                        9.82         ----------  LV e&', average                         10.5  cm/s   ----------  LV E/e&', average                       8.13         ----------    Ventricular septum                     Value        Reference  IVS thickness, ED                      12.6  mm     ----------    LVOT                                   Value        Reference  LVOT ID, S                             26    mm     ----------  LVOT area                              5.31  cm^2   ----------  LVOT peak velocity, S                  124   cm/s   ----------  LVOT mean velocity, S                  83.5  cm/s   ----------  LVOT VTI, S                            25.5  cm     ----------  LVOT peak gradient, S                  6     mm Hg  ----------    Aorta                                  Value        Reference  Aortic root ID, ED                     38    mm     ----------  Ascending aorta ID, A-P, S             41    mm     ----------    Left atrium  Value        Reference  LA ID, A-P, ES                         50    mm     ----------  LA ID/bsa, A-P                         1.97  cm/m^2 <=2.2  LA volume, S                           100   ml     ----------  LA volume/bsa, S                       39.4  ml/m^2 ----------  LA volume, ES, 1-p A4C                 51.5  ml     ----------  LA volume/bsa, ES, 1-p A4C             20.3  ml/m^2 ----------  LA volume, ES, 1-p A2C                 158   ml     ----------  LA volume/bsa, ES, 1-p A2C             62.2  ml/m^2 ----------    Mitral valve                           Value        Reference  Mitral E-wave peak velocity             85.4  cm/s   ----------  Mitral A-wave peak velocity            102   cm/s   ----------  Mitral deceleration time       (H)     405   ms     150 - 230  Mitral peak gradient, D                3     mm Hg  ----------  Mitral E/A ratio, peak                 0.8          ----------  Mitral regurg VTI, PISA                161   cm     ----------    Pulmonary arteries                     Value        Reference  PA pressure, S, DP                     23    mm Hg  <=30    Tricuspid valve                        Value        Reference  Tricuspid regurg peak velocity         222   cm/s   ----------  Tricuspid peak RV-RA gradient          20    mm Hg  ----------  Right atrium                           Value        Reference  RA ID, S-I, ES, A4C            (H)     62.5  mm     34 - 49  RA area, ES, A4C               (H)     25.3  cm^2   8.3 - 19.5  RA volume, ES, A/L                     82.4  ml     ----------  RA volume/bsa, ES, A/L                 32.5  ml/m^2 ----------    Systemic veins                         Value        Reference  Estimated CVP                          3     mm Hg  ----------    Right ventricle                        Value        Reference  TAPSE                                  21.6  mm     ----------  RV pressure, S, DP                     23    mm Hg  <=30  RV s&', lateral, S                      13.6  cm/s   ----------  Legend: (L)  and  (H)  mark values outside specified reference range.  ------------------------------------------------------------------- Prepared and Electronically Authenticated by  Candee Furbish, M.D. 2019-06-26T13:15:17   ECHOCARDIOGRAM REPORT     Patient Name:   [name] Date of Exam: 12/09/2018 Medical Rec #:  [id number]      Height:       75.0 in Accession #:    [id number]     Weight:       265.2 lb Date of Birth:  03-19-1956      BSA:          2.47 m Patient Age:    58 years       BP:           122/86 mmHg Patient Gender: M               HR:           118 bpm. Exam Location:  [facility]    Procedure: 2D Echo, Color Doppler and Cardiac Doppler  Indications:    I34.0 Mitral regurgitation.   History:        Patient has prior history of Echocardiogram examinations, most                 recent 04/27/2018.  Dilated ascending aorta, Atrial Fibrillation,                 MR; Risk Factors: Hypertension; Medications: Obesity.   Sonographer:    Coralyn Helling RDCS Referring Phys: [place]    1. The left ventricle has moderately reduced systolic function of 75-79%. The cavity size was severely increased. There is mildly increased left ventricular wall thickness. Left ventricular diastology could not be evaluated Left ventricular diffuse  hypokinesis.  2. The right ventricle has moderately reduced systolic function. The cavity was normal. There is no increase in right ventricular wall thickness.  3. Left atrial size was severely dilated.  4. Right atrial size was severely dilated.  5. Possible flail segement of posterior MV leaflet. Mitral valve regurgitation is moderate by color flow Doppler.  6. The tricuspid valve is normal in structure.  7. The aortic valve is tricuspid There is mild thickening of the aortic valve.  8. The pulmonic valve was normal in structure.  9. There is mild dilatation of the aortic root and ascending aorta. 10. Moderate global reduction in LV systolic function; severe LVE; severe biatrial enlargement; moderate RV dysfunction; possible flail segment of posterior MV leaflet; eccentric MR difficult to quantitate; suggest TEE to further assess.  FINDINGS  Left Ventricle: The left ventricle has moderately reduced systolic function of 72-82%. The cavity size was severely increased. There is mildly increased left ventricular wall thickness. Left ventricular diastology could not be evaluated Left ventricular  diffuse hypokinesis. Right Ventricle: The right ventricle  has moderately reduced systolic function. The cavity was normal. There is no increase in right ventricular wall thickness. Left Atrium: left atrial size was severely dilated Right Atrium: right atrial size was severely dilated Interatrial Septum: No atrial level shunt detected by color flow Doppler.  Pericardium: There is no evidence of pericardial effusion. Mitral Valve: Possible flail segement of posterior MV leaflet. Mitral valve regurgitation is moderate by color flow Doppler. Tricuspid Valve: The tricuspid valve is normal in structure. Tricuspid valve regurgitation is mild by color flow Doppler. Aortic Valve: The aortic valve is tricuspid There is mild thickening of the aortic valve. Aortic valve regurgitation was not visualized by color flow Doppler. Pulmonic Valve: The pulmonic valve was normal in structure. Pulmonic valve regurgitation is trivial by color flow Doppler. Aorta: There is mild dilatation of the aortic root and ascending aorta measuring 39 mm. Venous: The inferior vena cava measures 2.40 cm, is normal in size with greater than 50% respiratory variability.   LEFT VENTRICLE PLAX 2D (Teich) LV EF:          46.8 % LVIDd:          6.45 cm LVIDs:          4.90 cm LV PW:          1.40 cm LV IVS:         1.10 cm LVOT diam:      2.30 cm LV SV:          99 ml LVOT Area:      4.15 cm  RIGHT VENTRICLE RVSP:           27.9 mmHg  LEFT ATRIUM              Index       RIGHT ATRIUM           Index LA diam:        5.30 cm  2.14 cm/m  RA Pressure: 10 mmHg LA Vol (A2C):   116.0 ml 46.89 ml/m RA Area:     32.70 cm LA Vol (A4C):   142.5 ml 57.60 ml/m RA Volume:   129.00 ml 52.14 ml/m LA Biplane Vol: 138.0 ml 55.78 ml/m  AORTIC VALVE LVOT Vmax:   62.65 cm/s LVOT Vmean:  39.925 cm/s LVOT VTI:    0.104 m   AORTA Ao Root diam: 3.90 cm Ao Asc diam:  4.03 cm  MITRAL VALVE               TR Peak grad: 17.9 mmHg MV Area (PHT):             TR Vmax:      239.00 cm/s MV PHT:                     RVSP:         27.9 mmHg MV Decel Time: 174 msec MV E velocity: 139.40 cm/s  IVC IVC diam: 2.40 cm    Kirk Ruths MD Electronically signed by Kirk Ruths MD Signature Date/Time: 12/09/2018/5:09:49 PM       PROCEDURE NOTE:  Procedure:  Transesophageal echocardiogram Operator:  Fransico Him, MD Indications:  Atrial fibrillation Complications: None  During this procedure the patient is administered a total of Propofol 350 mg to achieve and maintain moderate conscious sedation.  The patient's heart rate, blood pressure, and oxygen saturation are monitored continuously during the procedure by anesthesia.   Results: Normal LV size with moderately reduced LV function with EF 30-35% Normal RV size and moderately reduced function Severely dilated RA with spontaneous echo contrast. Severely dilated LA with spontaneous echo contrast.  No evidence of LA or LAA thrombus.  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 Normal trileaflet AV Normal interatrial septum with no evidence of shunt by colorflow dopper  Normal thoracic and ascending aorta.  The patient tolerated the procedure well and went on to DCCV  Signed: Fransico Him, MD [facility]   Electrical Cardioversion Procedure Note MARIS BENA [id number] [birth year]  Procedure: Electrical Cardioversion Indications:  Atrial Fibrillation  Time Out: Verified patient identification, verified procedure,medications/allergies/relevent history reviewed, required imaging and test results available.  Performed  Procedure Details  Cardioversion was done with  synchronized biphasic defibrillation with AP pads with 150watts.  The patient converted to normal sinus rhythm. The patient tolerated the procedure well   IMPRESSION:  Successful cardioversion of atrial fibrillation  Patient has severe snoring with oxygen desaturations during sleep - recommend sleeps study.     Traci Turner 01/03/2019, 8:00 AM   Impression:  Patient has mitral valve prolapse with stage D severe symptomatic primary mitral regurgitation.  He presents with a fairly rapid progression of symptoms over the last few months resulting in acutely decompensated combined systolic and diastolic congestive heart failure in the setting of recent onset persistent atrial fibrillation and atrial flutter.  Since hospital admission he responded well to intravenous diuresis and underwent successful DC cardioversion.  I have personally reviewed the patient's recent transthoracic and transesophageal echocardiograms.  He has a large flail segment involving the middle scallop of the posterior leaflet with multiple ruptured primary chordae tendinae.  Left ventricular function is moderate to severely depressed.  There appears to be mild right ventricular dysfunction.  There is severe bilateral atrial enlargement.  There is no question the patient needs elective mitral valve repair.  He would likely benefit from a concomitant Maze procedure.  Options include stopping Eliquis with plans for left and right heart catheterization and possible surgical intervention during this hospitalization versus hospital discharge with very close medical follow-up and plans for elective left and right heart catheterization followed by surgery in 4 to 6 weeks.  Depending upon results of diagnostic cardiac catheterization and CT angiography, the patient may be an acceptable candidate for minimally invasive approach for surgery.    Plan:  The patient and his wife were counseled at length regarding the indications, risks  and potential benefits of mitral valve repair and maze procedure.  The rationale for elective surgery has been explained, including a comparison between surgery and continued medical therapy with close follow-up.  The likelihood of successful and durable valve repair has been discussed with particular reference to the findings of their recent echocardiogram.  Based upon these findings and previous experience, I have quoted them a greater than 90 percent likelihood of successful valve repair.  Questions related to the severity of the patient's acutely decompensated heart failure and left ventricular function have been discussed.  Expectations for the patient's postoperative convalescence have been discussed.  The need for further diagnostic testing has been reviewed, including the need for left and right heart catheterization.  Questions related to the timing of catheterization and surgery have been discussed including issues related to recent DC cardioversion.  At this juncture the patient prefers to go home for a period of time to get his personal affairs in order.  I think this is reasonable as long as he is followed very closely as an outpatient.  We will make arrangements for the patient to return to see me in the office once his diagnostic cardiac catheterization has been performed.  We will proceed with CT angiography to evaluate the feasibility of peripheral cannulation for surgery while the patient is in the hospital this afternoon.  All questions have been addressed.    I spent in excess of 120 minutes during the conduct of this hospital consultation and >50% of this time involved direct face-to-face encounter for counseling and/or coordination of the patient's care.    Valentina Gu. Roxy Manns, MD 01/03/2019 4:13 PM

## 2019-01-03 NOTE — Progress Notes (Signed)
  Echocardiogram Echocardiogram Transesophageal has been performed.  [name] 01/03/2019, 9:36 AM

## 2019-01-03 NOTE — Anesthesia Procedure Notes (Signed)
Procedure Name: MAC Date/Time: 01/03/2019 8:10 AM Performed by: Candis Shine, CRNA Pre-anesthesia Checklist: Patient identified, Emergency Drugs available, Suction available, Patient being monitored and Timeout performed Patient Re-evaluated:Patient Re-evaluated prior to induction Oxygen Delivery Method: Nasal cannula Dental Injury: Teeth and Oropharynx as per pre-operative assessment

## 2019-01-03 NOTE — Transfer of Care (Signed)
Immediate Anesthesia Transfer of Care Note  Patient: [name]  Procedure(s) Performed: TRANSESOPHAGEAL ECHOCARDIOGRAM (TEE) (N/A ) CARDIOVERSION (N/A )  Patient Location: Endoscopy Unit  Anesthesia Type:MAC  Level of Consciousness: awake, alert  and oriented  Airway & Oxygen Therapy: Patient Spontanous Breathing and Patient connected to nasal cannula oxygen  Post-op Assessment: Report given to RN and Post -op Vital signs reviewed and stable  Post vital signs: Reviewed and stable  Last Vitals:  Vitals Value Taken Time  BP 96/65 01/03/2019  8:51 AM  Temp    Pulse 83 01/03/2019  8:51 AM  Resp 24 01/03/2019  8:51 AM  SpO2 96 % 01/03/2019  8:51 AM  Vitals shown include unvalidated device data.  Last Pain:  Vitals:   01/03/19 0737  TempSrc: Oral  PainSc: 0-No pain         Complications: No apparent anesthesia complications

## 2019-01-03 NOTE — Anesthesia Postprocedure Evaluation (Signed)
Anesthesia Post Note  Patient: [name]  Procedure(s) Performed: TRANSESOPHAGEAL ECHOCARDIOGRAM (TEE) (N/A ) CARDIOVERSION (N/A )     Patient location during evaluation: PACU Anesthesia Type: MAC Level of consciousness: awake and alert Pain management: pain level controlled Vital Signs Assessment: post-procedure vital signs reviewed and stable Respiratory status: spontaneous breathing, nonlabored ventilation and respiratory function stable Cardiovascular status: blood pressure returned to baseline and stable Postop Assessment: no apparent nausea or vomiting Anesthetic complications: no    Last Vitals:  Vitals:   01/03/19 0900 01/03/19 0908  BP: (!) 135/115 100/79  Pulse: 85 87  Resp: (!) 26 (!) 27  Temp:    SpO2: 91% 93%    Last Pain:  Vitals:   01/03/19 0908  TempSrc:   PainSc: 0-No pain                 Brennan Bailey

## 2019-01-04 ENCOUNTER — Encounter (HOSPITAL_COMMUNITY): Payer: Self-pay | Admitting: Cardiology

## 2019-01-04 DIAGNOSIS — I420 Dilated cardiomyopathy: Secondary | ICD-10-CM

## 2019-01-04 DIAGNOSIS — I341 Nonrheumatic mitral (valve) prolapse: Secondary | ICD-10-CM

## 2019-01-04 DIAGNOSIS — I4892 Unspecified atrial flutter: Secondary | ICD-10-CM

## 2019-01-04 DIAGNOSIS — I77810 Thoracic aortic ectasia: Secondary | ICD-10-CM

## 2019-01-04 HISTORY — DX: Thoracic aortic ectasia: I77.810

## 2019-01-04 HISTORY — DX: Dilated cardiomyopathy: I42.0

## 2019-01-04 LAB — BASIC METABOLIC PANEL
Anion gap: 10 (ref 5–15)
BUN: 21 mg/dL (ref 8–23)
CO2: 31 mmol/L (ref 22–32)
CREATININE: 1.24 mg/dL (ref 0.61–1.24)
Calcium: 8.9 mg/dL (ref 8.9–10.3)
Chloride: 101 mmol/L (ref 98–111)
GFR calc Af Amer: >60 mL/min (ref >60)
GFR calc non Af Amer: >60 mL/min (ref >60)
Glucose, Bld: 112 mg/dL — ABNORMAL HIGH (ref 70–99)
Potassium: 3.4 mmol/L — ABNORMAL LOW (ref 3.5–5.1)
SODIUM: 142 mmol/L (ref 135–145)

## 2019-01-04 MED ORDER — METOPROLOL TARTRATE 100 MG PO TABS: 100.0000 mg | ORAL_TABLET | Freq: Two times a day (BID) | ORAL | 6 refills | Status: DC

## 2019-01-04 MED ORDER — AMIODARONE HCL 200 MG PO TABS: 200.0000 mg | ORAL_TABLET | Freq: Two times a day (BID) | ORAL | 6 refills | Status: DC

## 2019-01-04 MED ORDER — SPIRONOLACTONE 25 MG PO TABS: 25.0000 mg | ORAL_TABLET | Freq: Every day | ORAL | 6 refills | Status: DC

## 2019-01-04 MED ORDER — SACUBITRIL-VALSARTAN 24-26 MG PO TABS: 1.0000 | ORAL_TABLET | Freq: Two times a day (BID) | ORAL | 6 refills | Status: DC

## 2019-01-04 MED ORDER — POTASSIUM CHLORIDE CRYS ER 10 MEQ PO TBCR
10.0000 meq | EXTENDED_RELEASE_TABLET | Freq: Once | ORAL | Status: AC
  Administered 2019-01-04: 10 meq via ORAL

## 2019-01-04 MED ORDER — TORSEMIDE 20 MG PO TABS: 20.0000 mg | ORAL_TABLET | Freq: Every day | ORAL | 6 refills | Status: DC

## 2019-01-04 NOTE — Progress Notes (Signed)
Progress Note  Patient Name: [name] Date of Encounter: 01/04/2019  Primary Cardiologist: Quay Burow, MD   Subjective   [name] is a 63 year old gentleman with a long history of mitral valve prolapse.  He recently developed congestive heart failure and atrial fibrillation.  Echocardiogram was suggestive of a partially flail mitral valve leaflet. Esophageal echocardiogram this morning confirms the presence of a partially flail leaflet likely due to a ruptured chordae. Successfully cardioverted.  His heart rate was 120 yesterday and now is in the 80s.  Feeling well after cardioversion. Marland Kitchen He is diuresed a total of 13.7 L so far during this admission. Torsemide seems to be working quite well.  Inpatient Medications    Scheduled Meds: . amiodarone  200 mg Oral BID  . apixaban  5 mg Oral BID  . doxazosin  4 mg Oral QHS  . metoprolol tartrate  100 mg Oral BID  . potassium chloride  10 mEq Oral BID  . sacubitril-valsartan  1 tablet Oral BID  . sodium chloride flush  3 mL Intravenous Q12H  . spironolactone  25 mg Oral Daily  . torsemide  20 mg Oral Daily   Continuous Infusions: . sodium chloride Stopped (01/02/19 2009)   PRN Meds: ALPRAZolam, sodium chloride flush   Vital Signs    Vitals:   01/03/19 1656 01/03/19 1938 01/04/19 0350 01/04/19 0852  BP: 104/85 115/90 106/83 (!) 101/97  Pulse: 92 97 83 79  Resp: 18 18 18    Temp: 98.6 F (37 C) 98.5 F (36.9 C) 97.8 F (36.6 C)   TempSrc: Oral Oral Oral   SpO2: 97%  94%   Weight:   115.8 kg   Height:        Intake/Output Summary (Last 24 hours) at 01/04/2019 0927 Last data filed at 01/04/2019 5462 Gross per 24 hour  Intake 720 ml  Output 1650 ml  Net -930 ml   Last 3 Weights 01/04/2019 01/03/2019 01/02/2019  Weight (lbs) 255 lb 6.4 oz 255 lb 3.2 oz 260 lb 3.2 oz  Weight (kg) 115.849 kg 115.758 kg 118.026 kg      Telemetry    NSR    ECG    Normal sinus rhythm/ectopic atrial rhythm.  Physical Exam    Physical Exam: Blood pressure (!) 101/97, pulse 79, temperature 97.8 F (36.6 C), temperature source Oral, resp. rate 18, height 6\' 3"  (1.905 m), weight 115.8 kg, SpO2 94 %.  GEN:  Well nourished, well developed in no acute distress HEENT: Normal NECK: No JVD; No carotid bruits LYMPHATICS: No lymphadenopathy CARDIAC: RRR  , 7-0/3 systolic murmur radiating to the axilla  RESPIRATORY:  Clear to auscultation without rales, wheezing or rhonchi  ABDOMEN: Soft, non-tender, non-distended MUSCULOSKELETAL:  No edema; No deformity  SKIN: Warm and dry NEUROLOGIC:  Alert and oriented x 3   Labs    Chemistry Recent Labs  Lab 01/02/19 0434 01/03/19 0429 01/04/19 0357  NA 141 141 142  K 3.6 4.0 3.4*  CL 102 100 101  CO2 31 32 31  GLUCOSE 118* 112* 112*  BUN 18 22 21   CREATININE 1.26* 1.18 1.24  CALCIUM 8.6* 8.8* 8.9  PROT  --  5.0*  --   ALBUMIN  --  2.9*  --   AST  --  26  --   ALT  --  18  --   ALKPHOS  --  40  --   BILITOT  --  1.0  --   GFRNONAA >60 >60 >  60  GFRAA >60 >60 >60  ANIONGAP 8 9 10      Hematology Recent Labs  Lab 12/30/18 1558  WBC 6.4  RBC 4.69  HGB 13.0  HCT 42.7  MCV 91.0  MCH 27.7  MCHC 30.4  RDW 16.9*  PLT 150    Cardiac Enzymes Recent Labs  Lab 12/30/18 1558  TROPONINI <0.03   No results for input(s): TROPIPOC in the last 168 hours.   BNP Recent Labs  Lab 12/30/18 1558  BNP 1,365.8*     DDimer No results for input(s): DDIMER in the last 168 hours.   Radiology    No results found.  Cardiac Studies   2D Echo 12/09/18 IMPRESSIONS    1. The left ventricle has moderately reduced systolic function of 25-85%. The cavity size was severely increased. There is mildly increased left ventricular wall thickness. Left ventricular diastology could not be evaluated Left ventricular diffuse  hypokinesis.  2. The right ventricle has moderately reduced systolic function. The cavity was normal. There is no increase in right ventricular wall  thickness.  3. Left atrial size was severely dilated.  4. Right atrial size was severely dilated.  5. Possible flail segement of posterior MV leaflet. Mitral valve regurgitation is moderate by color flow Doppler.  6. The tricuspid valve is normal in structure.  7. The aortic valve is tricuspid There is mild thickening of the aortic valve.  8. The pulmonic valve was normal in structure.  9. There is mild dilatation of the aortic root and ascending aorta. 10. Moderate global reduction in LV systolic function; severe LVE; severe biatrial enlargement; moderate RV dysfunction; possible flail segment of posterior MV leaflet; eccentric MR difficult to quantitate; suggest TEE to further assess.  Patient Profile     63 y.o. male with past medical history of mitral valve prolapse/mitral regurgitation, recent onset atrial fibrillation/flutter admitted with acute combined systolic/diastolic congestive heart failure.  Most recent echocardiogram December 09, 2018 showed ejection fraction 35 to 40% (60-65% in June 2019), moderate RV dysfunction, biatrial enlargement, possible flail segment of posterior mitral valve leaflet with at least moderate mitral regurgitation, TEE recommended. Not yet performed.   Assessment & Plan    1. Acute Combined Systolic and Diastolic CHF: He seems to be very stable.  He has been oral torsemide for the past couple days and is diuresing well. Continue spironolactone and Entresto. His blood pressure is a little soft this morning.  We will stop the doxazosin.  He will follow-up with Dr. Gwenlyn Found or an APP [facility] [place], [place] )  next week. Follow up with Dr. Gwenlyn Found around March  23-24 to arrange for cath on March 26 or March 27.  He is scheduled to see Dr. Roxy Manns on March 30 to discuss surgery .   2. Cardiomyopathy:  He seems to be doing better.  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  3. Atrial Flutter:  He was successfully cardioverted into sinus rhythm  today.  His rhythm actually looks like it could be an ectopic atrial rhythm.  Added amiodarone.  Continue metoprolol.  4. Mitral Valve Regurgitation/ Possible Flail Segment of Posterior Mitral Valve Leaflet: TEE shows partial flail leaflet.   The plan is for 3 weeks of anticoagulation.   We will see Dr. Alvester Chou in approximately 3 weeks or so.  We will set him up for a heart catheterization. He is scheduled to see Dr. Roxy Manns on March 30.  It would be nice to arrange to have  a heart catheterization prior to March 30. CT scan of the chest has already been done.   6. Dilated Aortic Root/Ascending Aorta: mild dilation measuring 39 mm on recent echo. AV is tricuspid. No aortic pathology. Can be followed yearly by Dr. Gwenlyn Found.     Mertie Moores, MD  01/04/2019 9:27 AM    [address] [place],  [place], [place]  [address] Pager [phone] Phone: [phone]; Fax: [phone]

## 2019-01-04 NOTE — Care Management (Signed)
#      S/W  ELLI  @ [employer]  RX # [phone]   ENTRESTO  24 - 26 MG BID COVER- YES CO-PAY- $ 45.00 TIER- 2 DRUG PRIOR APPROVAL- NO  PREFERRED PHARMACY: YES [facility]

## 2019-01-04 NOTE — Discharge Instructions (Signed)
Heart Healthy low salt diet,  Weigh daily including day of discharge and make a log.  Call Dr. Kennon Holter office for 3 lb wt gain in a day or 5 lbs in a week.    We changed a lot of your medications and added new ones to prevent a fib and to help your heart beat stronger, and to keep fluid off.  Call if any questions.  Very important to take meds.     Do not miss Eliquis   Information on my medicine - ELIQUIS (apixaban)  Why was Eliquis prescribed for you? Eliquis was prescribed for you to reduce the risk of a blood clot forming that can cause a stroke if you have a medical condition called atrial fibrillation (a type of irregular heartbeat).  What do You need to know about Eliquis ? Take your Eliquis TWICE DAILY - one tablet in the morning and one tablet in the evening with or without food. If you have difficulty swallowing the tablet whole please discuss with your pharmacist how to take the medication safely.  Take Eliquis exactly as prescribed by your doctor and DO NOT stop taking Eliquis without talking to the doctor who prescribed the medication.  Stopping may increase your risk of developing a stroke.  Refill your prescription before you run out.  After discharge, you should have regular check-up appointments with your healthcare provider that is prescribing your Eliquis.  In the future your dose may need to be changed if your kidney function or weight changes by a significant amount or as you get older.  What do you do if you miss a dose? If you miss a dose, take it as soon as you remember on the same day and resume taking twice daily.  Do not take more than one dose of ELIQUIS at the same time to make up a missed dose.  Important Safety Information A possible side effect of Eliquis is bleeding. You should call your healthcare provider right away if you experience any of the following: ? Bleeding from an injury or your nose that does not stop. ? Unusual colored urine (red or dark  brown) or unusual colored stools (red or black). ? Unusual bruising for unknown reasons. ? A serious fall or if you hit your head (even if there is no bleeding).  Some medicines may interact with Eliquis and might increase your risk of bleeding or clotting while on Eliquis. To help avoid this, consult your healthcare provider or pharmacist prior to using any new prescription or non-prescription medications, including herbals, vitamins, non-steroidal anti-inflammatory drugs (NSAIDs) and supplements.  This website has more information on Eliquis (apixaban): http://[email or web address].com/eliquis/home

## 2019-01-04 NOTE — Discharge Summary (Addendum)
Discharge Summary    Patient ID: [name] MRN: [id number]; DOB: 1956/09/29  Admit date: 12/30/2018 Discharge date: 01/04/2019  Primary Care Provider: [name], MD  Primary Cardiologist: Quay Burow, MD  Primary Electrophysiologist:  None   Discharge Diagnoses    Principal Problem:   Hypertensive urgency Active Problems:   Acute on chronic systolic (congestive) heart failure (HCC)   Acute systolic heart failure (HCC)   Atrial flutter with rapid ventricular response (HCC)   Mitral valve prolapse   Dilated aortic root (HCC)   Dilated cardiomyopathy ([place])   Allergies No Known Allergies  Diagnostic Studies/Procedures    Echo prior to admit 12/09/18 IMPRESSIONS    1. The left ventricle has moderately reduced systolic function of 72-09%. The cavity size was severely increased. There is mildly increased left ventricular wall thickness. Left ventricular diastology could not be evaluated Left ventricular diffuse  hypokinesis.  2. The right ventricle has moderately reduced systolic function. The cavity was normal. There is no increase in right ventricular wall thickness.  3. Left atrial size was severely dilated.  4. Right atrial size was severely dilated.  5. Possible flail segement of posterior MV leaflet. Mitral valve regurgitation is moderate by color flow Doppler.  6. The tricuspid valve is normal in structure.  7. The aortic valve is tricuspid There is mild thickening of the aortic valve.  8. The pulmonic valve was normal in structure.  9. There is mild dilatation of the aortic root and ascending aorta. 10. Moderate global reduction in LV systolic function; severe LVE; severe biatrial enlargement; moderate RV dysfunction; possible flail segment of posterior MV leaflet; eccentric MR difficult to quantitate; suggest TEE to further assess.  FINDINGS  Left Ventricle: The left ventricle has moderately reduced systolic function of 47-09%. The cavity size was severely  increased. There is mildly increased left ventricular wall thickness. Left ventricular diastology could not be evaluated Left ventricular  diffuse hypokinesis. Right Ventricle: The right ventricle has moderately reduced systolic function. The cavity was normal. There is no increase in right ventricular wall thickness. Left Atrium: left atrial size was severely dilated Right Atrium: right atrial size was severely dilated Interatrial Septum: No atrial level shunt detected by color flow Doppler.  Pericardium: There is no evidence of pericardial effusion. Mitral Valve: Possible flail segement of posterior MV leaflet. Mitral valve regurgitation is moderate by color flow Doppler. Tricuspid Valve: The tricuspid valve is normal in structure. Tricuspid valve regurgitation is mild by color flow Doppler. Aortic Valve: The aortic valve is tricuspid There is mild thickening of the aortic valve. Aortic valve regurgitation was not visualized by color flow Doppler. Pulmonic Valve: The pulmonic valve was normal in structure. Pulmonic valve regurgitation is trivial by color flow Doppler. Aorta: There is mild dilatation of the aortic root and ascending aorta measuring 39 mm. Venous: The inferior vena cava measures 2.40 cm, is normal in size with greater than 50% respiratory variability.    TEE 01/03/19  IMPRESSIONS    1. The left ventricle has moderately reduced systolic function, with an ejection fraction of 35-40%.  2. The right ventricle has moderately reduced systolic function.  3. Left atrial size was severely dilated.  4. Normal LA appendage with no evidence of thrombus.  5. Right atrial size was severely dilated.  6. There is severe myoxmatous degeneration of the posterior mitral valve leafelt with severe prolapse of the P2 component of the posterior leaflet. There appears to be a ruptured chordae tendinae with partial flail leaflet.  There is moderate to severe  eccentric mitral regurgitation directed  towards the interatrial septum and wraps around the LA to the posterior wall and mouth of the pulmonary vein but no flow reversal in the pulmonary vein by doppler.  7. The tricuspid valve was normal in structure with trivial TR.  8. The aortic valve is tricuspid.  9. The pulmonic valve was normal in structure with trivial PR. 10. The aortic root and ascending aorta are normal in size and structure. 11. Normal interatrial septum with no evidence of shunt by colorflow doppler.  FINDINGS  Left Ventricle: The left ventricle has moderately reduced systolic function, with an ejection fraction of 35-40%. Right Ventricle: The right ventricle has moderately reduced systolic function. Left Atrium: Left atrial size was severely dilated. There is continuous echo contrast seen in the left atrial cavity. Normal LA appendage with no evidence of thrombus. Right Atrium: Right atrial size was severely dilated. Right atrial pressure is estimated at 10 mmHg. Interatrial Septum: No atrial level shunt detected by color flow Doppler. Mitral Valve: The mitral valve is myxomatous. Severe thickening of the posterior mitral valve leaflet. Mitral valve regurgitation is moderate to severe by color flow Doppler. There is severe holosystolic prolapse of of the posterior mitral leaflet of the  mitral valve. There is severe myoxmatous degeneration of the posterior mitral valve leafelt with severe prolapse of the P2 component of the posterior leaflet. There appears to be a ruptured chordae tendinae with partial flail leaflet. There is moderate  to severe eccentric mitral regurgitation directed towards the interatrial septum and wraps around the LA to the posterior wall and mouth of the pulmonary vein but no flow reversal in the pulmonary vein by doppler. Tricuspid Valve: The tricuspid valve was normal in structure. Tricuspid valve regurgitation is mild by color flow Doppler. Aortic Valve: The aortic valve is tricuspid. Pulmonic  Valve: The pulmonic valve was normal in structure. Pulmonic valve regurgitation is trivial by color flow Doppler. Aorta: The aortic root and ascending aorta are normal in size and structure.   RIGHT ATRIUM RA Pressure: 10 mmHg   Electrical Cardioversion Procedure Note [name] [id number] 1955-11-30  Procedure: Electrical Cardioversion Indications:  Atrial Fibrillation  Time Out: Verified patient identification, verified procedure,medications/allergies/relevent history reviewed, required imaging and test results available.  Performed  Procedure Details  Cardioversion was done with synchronized biphasic defibrillation with AP pads with 150watts.  The patient converted to normal sinus rhythm. The patient tolerated the procedure well   IMPRESSION:  Successful cardioversion of atrial fibrillation  Patient has severe snoring with oxygen desaturations during sleep - recommend sleeps study.  _____________   History of Present Illness     63 y.o. male with longstanding mitral regurgitation, recent onset atrial fibrillation now presenting with first ever episode of heart failure exacerbation, atrial flutter with 2: 1 AV block and rapid ventricular response, recent echo showing decreased left ventricular systolic function and worsening mitral insufficiency.  Pt admitted 12/30/18 from the office after he had been started on anticoagulation with Eliquis for a fib with RVR 1 moth prior to admit.  Plan had been for DCCV in a month.  He returned to office on the 28th with 20 lb wt gain, lower ext edema, dyspnea with minimal activity, and nocturnal dyspnea.  Pt believed he was up 35 lbs.  He denied any chest pain and had ben 100% compliant with anticoagulation. Echo 12/09/18 with dilated LV, and EF now 35-40%. Global hypokinesis, severe biatrial dilatation,  possible  flail segment of the posterior mitral leaflet with eccentric mitral regurgitation that was difficult to quantify.  TEE was  recommended.  In 04/2018 he had normal EF.    He was admitted with acute systolic HF with plans for diuresis, his dry wt is around 250 lbs. -on admit 285 lbs.  Once diuresed plan would be for DCCV.  Additionally his MR is severe MR.   Hospital Course     Consultants: Dr. Roxy Manns with [facility]  Pt was diuresed and spironolactone added.  He is on metoprolol and entresto as well.  Remained in a flutter.  By 01/01/19 pt was neg 3590 and wt down to 266 lbs.  TEE DCCV was arranged for 01/03/19. He also has dilated aortic root, ascending aorta. On echo will follow yearly.    On 01/03/19 pt had TEE DCCV that was successful and pt has maintained SR since.   He has been seen and evaluated by Dr. Acie Fredrickson and found stable for discharge.  . Echocardiogram was suggestive of a partially flail mitral valve leaflet. Esophageal echocardiogram this morning confirms the presence of a partially flail leaflet likely due to a ruptured chordae.    Pt is neg 13,498 and wt from 126 kg to 115.8 Kg (277 lbs to 254 lbs)  He is discharged on amiodarone and BB- also on torsemide.  He will continue anticoagulation for 3-4 weeks and see Dr. Gwenlyn Found in 3 weeks.  Then he will need cardiac cath Lt and Rt - would be good to have cath prior to 01/30/19.  He has appt to see Dr. Roxy Manns march 30th.  CT of his chest has been done for surgery for partial flail leaflet.  _____________  Discharge Vitals Blood pressure (!) 101/97, pulse 79, temperature 97.8 F (36.6 C), temperature source Oral, resp. rate 18, height 6\' 3"  (1.905 m), weight 115.8 kg, SpO2 94 %.  Filed Weights   01/02/19 0441 01/03/19 0444 01/04/19 0350  Weight: 118 kg 115.8 kg 115.8 kg    Labs & Radiologic Studies    CBC No results for input(s): WBC, NEUTROABS, HGB, HCT, MCV, PLT in the last 72 hours. Basic Metabolic Panel Recent Labs    01/03/19 0429 01/04/19 0357  NA 141 142  K 4.0 3.4*  CL 100 101  CO2 32 31  GLUCOSE 112* 112*  BUN 22 21  CREATININE 1.18 1.24  CALCIUM  8.8* 8.9   Liver Function Tests Recent Labs    01/03/19 0429  AST 26  ALT 18  ALKPHOS 40  BILITOT 1.0  PROT 5.0*  ALBUMIN 2.9*   No results for input(s): LIPASE, AMYLASE in the last 72 hours. Cardiac Enzymes No results for input(s): CKTOTAL, CKMB, CKMBINDEX, TROPONINI in the last 72 hours. BNP Invalid input(s): POCBNP D-Dimer No results for input(s): DDIMER in the last 72 hours. Hemoglobin A1C No results for input(s): HGBA1C in the last 72 hours. Fasting Lipid Panel No results for input(s): CHOL, HDL, LDLCALC, TRIG, CHOLHDL, LDLDIRECT in the last 72 hours. Thyroid Function Tests No results for input(s): TSH, T4TOTAL, T3FREE, THYROIDAB in the last 72 hours.  Invalid input(s): FREET3 _____________  Dg Chest 2 View  Result Date: 12/30/2018 CLINICAL DATA:  Short of breath and atrial fibrillation. EXAM: CHEST - 2 VIEW COMPARISON:  01/15/2014 FINDINGS: Mild cardiomegaly. Normal vascularity. Low lung volumes. Bibasilar atelectasis. Small right pleural effusion. No pneumothorax. IMPRESSION: Cardiomegaly without decompensation. Bibasilar atelectasis. Small right pleural effusion. Electronically Signed   By: Marybelle Killings M.D.   On:  12/30/2018 16:40   Ct Angio Chest/abd/pel For Dissection W And/or W/wo  Result Date: 01/04/2019 CLINICAL DATA:  Inpatient. Mitral valve prolapse with severe mitral regurgitation and atrial fibrillation. Patient admitted with decompensated acute on chronic congestive heart failure. Evaluate feasibility of peripheral cannulation for minimally invasive surgery. EXAM: CT ANGIOGRAPHY CHEST, ABDOMEN AND PELVIS TECHNIQUE: Multidetector CT imaging through the chest, abdomen and pelvis was performed using the standard protocol during bolus administration of intravenous contrast. Multiplanar reconstructed images and MIPs were obtained and reviewed to evaluate the vascular anatomy. CONTRAST:  151mL ISOVUE-370 IOPAMIDOL (ISOVUE-370) INJECTION 76% COMPARISON:  12/30/2018 chest  radiograph. FINDINGS: CTA CHEST FINDINGS Cardiovascular: Moderate cardiomegaly with biatrial enlargement. No significant pericardial effusion/thickening. Atherosclerotic mildly tortuous nonaneurysmal thoracic aorta. No evidence of acute aortic syndrome. Patent aortic arch branch vessels. Dilated main pulmonary artery (3.8 cm diameter). No central pulmonary emboli. Mediastinum/Nodes: No discrete thyroid nodules. Unremarkable esophagus. No pathologically enlarged axillary, mediastinal or hilar lymph nodes. Lungs/Pleura: No pneumothorax. Small dependent right pleural effusion. No left pleural effusion. Mild passive atelectasis in the dependent lower lobes bilaterally. No acute consolidative airspace disease, lung masses or significant pulmonary nodules. Musculoskeletal: No aggressive appearing focal osseous lesions. Moderate thoracic spondylosis. Review of the MIP images confirms the above findings. CTA ABDOMEN AND PELVIS FINDINGS VASCULAR Aorta: Moderately atherosclerotic. Normal caliber aorta without aneurysm, dissection, vasculitis or significant stenosis. Minimal abdominal aortic luminal diameter 18.3 x 15.4 mm (series 6/image 148) in the infrarenal abdominal aorta. Celiac: Patent without evidence of aneurysm, dissection, vasculitis or significant stenosis. SMA: Patent without evidence of aneurysm, dissection, vasculitis or significant stenosis. Renals: Both renal arteries are patent without evidence of aneurysm, dissection, vasculitis, fibromuscular dysplasia or significant stenosis. IMA: Patent without evidence of aneurysm, dissection, vasculitis or significant stenosis. Inflow: Patent without evidence of aneurysm, dissection, vasculitis or significant stenosis. Mild to moderate tortuosity of the right common iliac artery. Otherwise mild tortuosity of the iliac arteries bilaterally. Minimal atherosclerosis of the common iliac arteries bilaterally. Mild-to-moderate atherosclerotic plaque in the common femoral  arteries bilaterally. Right common femoral artery is obscured by streak artifact from the right hip hardware. Estimated minimal luminal diameter in the right common femoral artery 9.1 x 6.1 mm. Estimated minimal luminal diameter in left common femoral artery 8.9 x 6.2 mm. Veins: No obvious venous abnormality within the limitations of this arterial phase study. Review of the MIP images confirms the above findings. NON-VASCULAR Hepatobiliary: Normal liver size. Scattered simple liver cysts, largest 2.7 cm in the anterior left liver lobe. Subcentimeter hypodense left liver lobe lesion is too small to characterize and requires no follow-up unless the patient has risk factors for liver malignancy. No radiopaque cholelithiasis. No gallbladder distention. Suggestion of mild diffuse gallbladder wall thickening without definite pericholecystic fluid. No biliary ductal dilatation. Pancreas: Normal, with no mass or duct dilation. Spleen: Normal size. No mass. Adrenals/Urinary Tract: Normal adrenals. Simple parapelvic cysts in both kidneys. No hydronephrosis. Nonspecific bilateral symmetric perinephric fluid and fat stranding. No suspicious contour deforming renal masses. Nondistended bladder is obscured by streak artifact from right hip hardware and appears grossly normal. Stomach/Bowel: Normal non-distended stomach. Normal caliber small bowel with no small bowel wall thickening. Normal appendix. Marked diffuse colonic diverticulosis, with no large bowel wall thickening or significant pericolonic fat stranding. Vascular/Lymphatic: Atherosclerotic nonaneurysmal abdominal aorta. No pathologically enlarged lymph nodes in the abdomen or pelvis. Reproductive: Normal size prostate with nonspecific internal prostatic calcifications. Other: No pneumoperitoneum, ascites or focal fluid collection. Musculoskeletal: No aggressive appearing focal osseous lesions. Right total  hip arthroplasty. Moderate lumbar spondylosis. Review of the MIP  images confirms the above findings. IMPRESSION: 1. No acute abnormality. Mild-to-moderate atherosclerosis in the aorta, iliac and femoral arteries. Mild-to-moderate tortuosity of the right common iliac artery. 2. Moderate cardiomegaly. 3. Dilated main pulmonary artery, suggesting pulmonary arterial hypertension. 4. Small dependent right pleural effusion. 5. Nonspecific mild diffuse gallbladder wall thickening without radiopaque cholelithiasis, more likely due to noninflammatory edema unless the patient has right upper quadrant abdominal symptoms. 6. Marked diffuse colonic diverticulosis. 7.  Aortic Atherosclerosis (ICD10-I70.0). Electronically Signed   By: Ilona Sorrel M.D.   On: 01/04/2019 09:40   Disposition   Pt is being discharged home today in good condition.  Follow-up Plans & Appointments    Follow-up Information    Erlene Quan, PA-C Follow up on 01/11/2019.   Specialties:  Cardiology, Radiology Why:  at 11:OO AM  Contact information: [address] [place] [address] [phone]        Lorretta Harp, MD Follow up on 01/25/2019.   Specialties:  Cardiology, Radiology Why:  at 11:30 AM Contact information: [address] [place] [address] [phone]        Rexene Alberts, MD Follow up on 01/30/2019.   Specialty:  Cardiothoracic Surgery Why:  at 1:00PM  Contact information: [place] [address] [phone]            Discharge Medications   Allergies as of 01/04/2019   No Known Allergies     Medication List    STOP taking these medications   CARTIA XT 240 MG 24 hr capsule Generic drug:  diltiazem   doxazosin 4 MG tablet Commonly known as:  CARDURA   furosemide 40 MG tablet Commonly known as:  LASIX   lisinopril 20 MG tablet Commonly known as:  PRINIVIL,ZESTRIL   naproxen sodium 220 MG tablet Commonly known as:  ALEVE     TAKE these medications   ALPRAZolam 0.5 MG  tablet Commonly known as:  XANAX Take 0.5 mg by mouth at bedtime as needed for anxiety.   amiodarone 200 MG tablet Commonly known as:  PACERONE Take 1 tablet (200 mg total) by mouth 2 (two) times daily.   apixaban 5 MG Tabs tablet Commonly known as:  ELIQUIS Take 1 tablet (5 mg total) by mouth 2 (two) times daily.   COLACE PO Take 3 capsules by mouth every morning. Over the counter   metoprolol tartrate 100 MG tablet Commonly known as:  LOPRESSOR Take 1 tablet (100 mg total) by mouth 2 (two) times daily. What changed:    medication strength  how much to take   MULTIVITAMIN PO Take 1 tablet by mouth daily.   OVER THE COUNTER MEDICATION Sleep aid - diphenhydramine   potassium chloride 10 MEQ tablet Commonly known as:  K-DUR Take 1 tablet (10 mEq total) by mouth 2 (two) times daily.   sacubitril-valsartan 24-26 MG Commonly known as:  ENTRESTO Take 1 tablet by mouth 2 (two) times daily.   spironolactone 25 MG tablet Commonly known as:  ALDACTONE Take 1 tablet (25 mg total) by mouth daily. Start taking on:  January 05, 2019   torsemide 20 MG tablet Commonly known as:  DEMADEX Take 1 tablet (20 mg total) by mouth daily. Start taking on:  January 05, 2019      Heart Healthy low salt diet,  Weigh daily including day of discharge and make a log.  Call Dr.  Berry's office for 3 lb wt gain in a day or 5 lbs in a week.    We changed a lot of your medications and added new ones to prevent a fib and to help your heart beat stronger, and to keep fluid off.  Call if any questions.  Very important to take meds.     Do not miss Eliquis  Acute coronary syndrome (MI, NSTEMI, STEMI, etc) this admission?: No.    Outstanding Labs/Studies   BMP on the 11th.   Duration of Discharge Encounter   Greater than 30 minutes including physician time.  Signed, Cecilie Kicks, NP 01/04/2019, 10:48 AM  Attending Note:   The patient was seen and examined.  Agree with assessment and plan as  noted above.  Changes made to the above note as needed.  Patient seen and independently examined with Cecilie Kicks, NP .   We discussed all aspects of the encounter. I agree with the assessment and plan as stated above.  1. Acute Combined Systolic and Diastolic CHF: He seems to be very stable.  He has been oral torsemide for the past couple days and is diuresing well. Continue spironolactone and Entresto. His blood pressure is a little soft this morning.  We will stop the doxazosin.  He will follow-up with Dr. Gwenlyn Found or an APP [facility] [place], [place] )  next week. Follow up with Dr. Gwenlyn Found around March  23-24 to arrange for cath on March 26 or March 27.  He is scheduled to see Dr. Roxy Manns on March 30 to discuss surgery .   2. Cardiomyopathy:  He seems to be doing better.  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  3. Atrial Flutter:  He was successfully cardioverted into sinus rhythm today.  His rhythm actually looks like it could be an ectopic atrial rhythm.  Added amiodarone.  Continue metoprolol.  4. Mitral Valve Regurgitation/ Possible Flail Segment of Posterior Mitral Valve Leaflet: TEE shows partial flail leaflet.   The plan is for 3 weeks of anticoagulation.   We will see Dr. Alvester Chou in approximately 3 weeks or so.  We will set him up for a heart catheterization. He is scheduled to see Dr. Roxy Manns on March 30.  It would be nice to arrange to have a heart catheterization prior to March 30. CT scan of the chest has already been done.   6. Dilated Aortic Root/Ascending Aorta: mild dilation measuring 39 mm on recent echo. AV is tricuspid. No aortic pathology. Can be followed yearly by Dr. Gwenlyn Found.     I have spent a total of 40 minutes with patient reviewing hospital  notes , telemetry, EKGs, labs and examining patient as well as establishing an assessment and plan that was discussed with the patient. > 50% of time was spent in direct patient care.    Thayer Headings, Brooke Bonito., MD, [facility] 01/04/2019, 3:22 PM [address]  [place] Pager [phone]

## 2019-01-06 ENCOUNTER — Encounter (HOSPITAL_COMMUNITY): Payer: Self-pay | Admitting: Cardiology

## 2019-01-06 ENCOUNTER — Telehealth: Payer: Self-pay | Admitting: Cardiovascular Disease

## 2019-01-06 NOTE — Telephone Encounter (Signed)
New message   Pt c/o medication issue:  1. Name of Medication: Eliquis, Torsemide   2. How are you currently taking this medication (dosage and times per day)? n/a  3. Are you having a reaction (difficulty breathing--STAT)? N/a   4. What is your medication issue? Patient states that he believes that some of his medications may be causing vision problems and difficulty sleeping. Please call to discuss.

## 2019-01-06 NOTE — Telephone Encounter (Signed)
Tried to [facility] number busy

## 2019-01-06 NOTE — Telephone Encounter (Signed)
Neither medicine should cause blurred vision or drowsiness.  Would not suggest stopping either without consent from Dr. Gwenlyn Found.  Will forward to him for review.

## 2019-01-07 NOTE — Telephone Encounter (Signed)
Have him come in to see Cyril Mourning next week to evaluate potential medication side effect

## 2019-01-10 NOTE — Telephone Encounter (Addendum)
Spoke with pt, all of his previous symptoms have completely resolved. He has a follow up appt tomorrow with the APP.

## 2019-01-11 ENCOUNTER — Encounter: Payer: Self-pay | Admitting: Cardiology

## 2019-01-11 ENCOUNTER — Ambulatory Visit: Payer: BLUE CROSS/BLUE SHIELD | Admitting: Cardiology

## 2019-01-11 ENCOUNTER — Other Ambulatory Visit: Payer: Self-pay

## 2019-01-11 DIAGNOSIS — I340 Nonrheumatic mitral (valve) insufficiency: Secondary | ICD-10-CM

## 2019-01-11 DIAGNOSIS — I5023 Acute on chronic systolic (congestive) heart failure: Secondary | ICD-10-CM | POA: Diagnosis not present

## 2019-01-11 DIAGNOSIS — I341 Nonrheumatic mitral (valve) prolapse: Secondary | ICD-10-CM | POA: Diagnosis not present

## 2019-01-11 DIAGNOSIS — I4892 Unspecified atrial flutter: Secondary | ICD-10-CM | POA: Diagnosis not present

## 2019-01-11 MED ORDER — METOPROLOL TARTRATE 50 MG PO TABS: 50.0000 mg | ORAL_TABLET | Freq: Two times a day (BID) | ORAL | 1 refills | Status: DC

## 2019-01-11 MED ORDER — AMIODARONE HCL 200 MG PO TABS: 200.0000 mg | ORAL_TABLET | Freq: Every day | ORAL | 1 refills | Status: DC

## 2019-01-11 NOTE — Patient Instructions (Signed)
Medication Instructions:  DECREASE Amiodarone to 200mg  Once a day  DECREASE Metoprolol from 100mg  to 50mg  Take 1 tablet twice a day If you need a refill on your cardiac medications before your next appointment, please call your pharmacy.   Lab work: None  If you have labs (blood work) drawn today and your tests are completely normal, you will receive your results only by: Marland Kitchen MyChart Message (if you have MyChart) OR . A paper copy in the mail If you have any lab test that is abnormal or we need to change your treatment, we will call you to review the results.  Testing/Procedures: None   Follow-Up: At [facility], you and your health needs are our priority.  As part of our continuing mission to provide you with exceptional heart care, we have created designated Provider Care Teams.  These Care Teams include your primary Cardiologist (physician) and Advanced Practice Providers (APPs -  Physician Assistants and Nurse Practitioners) who all work together to provide you with the care you need, when you need it. Lurena Joiner recommends you follow up as scheduled with Dr Gwenlyn Found.  Any Other Special Instructions Will Be Listed Below (If Applicable).

## 2019-01-11 NOTE — Assessment & Plan Note (Signed)
Surgical follow up 01/30/2019 after Rt and Lt cath

## 2019-01-11 NOTE — Assessment & Plan Note (Signed)
With flail leaflet on TEE

## 2019-01-11 NOTE — Assessment & Plan Note (Signed)
Pt admitted with acute CHF 12/30/2018-seen today as TOC f/u

## 2019-01-11 NOTE — Assessment & Plan Note (Signed)
TEE CV 01/03/2019-discharged on Amiodarone and Eliquis

## 2019-01-11 NOTE — Progress Notes (Signed)
01/11/2019 CANNEN DUPRAS   [birth year]  [id number]  Primary Physician [name], MD Primary Cardiologist: Dr Gwenlyn Found  HPI: Mr. Shear is a pleasant 63 year old male followed by Dr. Alvester Chou with a history of hypertension, mitral valve prolapse, and mitral regurgitation.  He had been stable until January 2020.  He developed atrial fibrillation with rapid ventricular response and shortness of breath.  He was seen in the office January 31 and Eliquis was added. Echo 12/09/2018 showed an EF of 35-40% with at least moderate MR.  On 12/31/2018 he was admitted with acute CHF.  He underwent TEE CV to NSR 01/03/2019.  He was discharged and is in the office today for TOC follow up.  The plan is for 3 weeks of anticoagulation then Rt and Lt heart cath.  He is to see Dr Roxy Manns 01/30/2019 for consideration of MV repair. Since discharge he has done well though he admits to fatigue. His HR in the office is NSR in the 40's.    Current Outpatient Medications  Medication Sig Dispense Refill  . ALPRAZolam (XANAX) 0.5 MG tablet Take 0.5 mg by mouth at bedtime as needed for anxiety.    Marland Kitchen amiodarone (PACERONE) 200 MG tablet Take 1 tablet (200 mg total) by mouth 2 (two) times daily. 60 tablet 6  . apixaban (ELIQUIS) 5 MG TABS tablet Take 1 tablet (5 mg total) by mouth 2 (two) times daily. 180 tablet 3  . Docusate Sodium (COLACE PO) Take 3 capsules by mouth every morning. Over the counter     . metoprolol tartrate (LOPRESSOR) 100 MG tablet Take 1 tablet (100 mg total) by mouth 2 (two) times daily. 60 tablet 6  . Multiple Vitamins-Minerals (MULTIVITAMIN PO) Take 1 tablet by mouth daily.    Marland Kitchen OVER THE COUNTER MEDICATION Sleep aid - diphenhydramine    . potassium chloride (K-DUR) 10 MEQ tablet Take 1 tablet (10 mEq total) by mouth 2 (two) times daily. 60 tablet 5  . sacubitril-valsartan (ENTRESTO) 24-26 MG Take 1 tablet by mouth 2 (two) times daily. 60 tablet 6  . spironolactone (ALDACTONE) 25 MG tablet Take 1 tablet (25 mg  total) by mouth daily. 30 tablet 6  . torsemide (DEMADEX) 20 MG tablet Take 1 tablet (20 mg total) by mouth daily. 30 tablet 6   No current facility-administered medications for this visit.     No Known Allergies  Past Medical History:  Diagnosis Date  . Acute on chronic systolic (congestive) heart failure ([place]) 12/30/2018  . Allergy   . Anxiety   . Arthritis   . Atrial flutter with rapid ventricular response ([place])   . Chronic systolic (congestive) heart failure ([place])   . Dilated aortic root ([place]) 01/04/2019  . Dilated cardiomyopathy ([place]) 01/04/2019  . GERD (gastroesophageal reflux disease)   . Heart murmur   . History of colon polyps   . Hypertension   . Insomnia   . Mitral valve prolapse   . Mitral valve regurgitation   . Persistent atrial fibrillation 12/02/2018  . Seizures ([place])    had one approx. 30 yrs ago,has not had any since  . Severe mitral regurgitation     Social History   Socioeconomic History  . Marital status: Married    Spouse name: Not on file  . Number of children: Not on file  . Years of education: Not on file  . Highest education level: Not on file  Occupational History  . Not on file  Social Needs  .  Financial resource strain: Not on file  . Food insecurity:    Worry: Not on file    Inability: Not on file  . Transportation needs:    Medical: Not on file    Non-medical: Not on file  Tobacco Use  . Smoking status: Never Smoker  . Smokeless tobacco: Never Used  Substance and Sexual Activity  . Alcohol use: Yes    Comment: 1 drink daily  . Drug use: No  . Sexual activity: Yes  Lifestyle  . Physical activity:    Days per week: Not on file    Minutes per session: Not on file  . Stress: Not on file  Relationships  . Social connections:    Talks on phone: Not on file    Gets together: Not on file    Attends religious service: Not on file    Active member of club or organization: Not on file    Attends meetings of clubs or organizations: Not  on file    Relationship status: Not on file  . Intimate partner violence:    Fear of current or ex partner: Not on file    Emotionally abused: Not on file    Physically abused: Not on file    Forced sexual activity: Not on file  Other Topics Concern  . Not on file  Social History Narrative  . Not on file     Family History  Problem Relation Age of Onset  . Hypertension Father   . Colon cancer Father   . Diabetes Mother   . Stomach cancer Neg Hx   . Esophageal cancer Neg Hx   . Pancreatic cancer Neg Hx   . Rectal cancer Neg Hx      Review of Systems: General: negative for chills, fever, night sweats or weight changes.  Cardiovascular: negative for chest pain, dyspnea on exertion, edema, orthopnea, palpitations, paroxysmal nocturnal dyspnea or shortness of breath Dermatological: negative for rash Respiratory: negative for cough or wheezing Urologic: negative for hematuria Abdominal: negative for nausea, vomiting, diarrhea, bright red blood per rectum, melena, or hematemesis Neurologic: negative for visual changes, syncope, or dizziness All other systems reviewed and are otherwise negative except as noted above.    Blood pressure 110/70, pulse (!) 50, height 6\' 3"  (1.905 m), weight 251 lb (113.9 kg).  General appearance: alert, cooperative and no distress Lungs: clear to auscultation bilaterally Heart: regular rate and rhythm and 2/6 MR murmur Extremities: no edema Neurologic: Grossly normal  EKG NSR, SB, RBBB  ASSESSMENT AND PLAN:   Acute on chronic systolic (congestive) heart failure ([facility]) Pt admitted with acute CHF 12/30/2018-seen today as TOC f/u  Atrial flutter with rapid ventricular response ([facility]) TEE CV 01/03/2019-discharged on Amiodarone and Eliquis  Mitral valve prolapse With flail leaflet on TEE  Severe mitral regurgitation Surgical follow up 01/30/2019 after Rt and Lt cath   PLAN  Decrease Lopressor to 50 mg BID, decrease Amiodarone to 200 mg daily.   Keep f/u with Dr Gwenlyn Found as scheduled 3/25.   Kerin Ransom PA-C 01/11/2019 11:14 AM

## 2019-01-23 ENCOUNTER — Encounter: Payer: Self-pay | Admitting: Thoracic Surgery (Cardiothoracic Vascular Surgery)

## 2019-01-23 ENCOUNTER — Telehealth: Payer: Self-pay | Admitting: Thoracic Surgery (Cardiothoracic Vascular Surgery)

## 2019-01-23 NOTE — Telephone Encounter (Signed)
[address]       [place] [address]             [phone]     CARDIOTHORACIC SURGERY TELEPHONE VIRTUAL OFFICE NOTE  Primary Cardiologist is Quay Burow, MD PCP is [name], MD   HPI:  Patient is a 63 year old male with mitral valve prolapse and severe mitral regurgitation, hypertension, and degenerative arthritis who was recently hospitalized with decompensated acute on chronic combined systolic and diastolic congestive heart failure in the setting of presumably recent onset persistent atrial fibrillation and atrial flutter.  He underwent TEE and cardioversion and is clinically done well since sinus rhythm was restored.  I had the opportunity to see him in consultation on January 03, 2019.  At that time we made tentative plans for elective left and right heart catheterization by Dr. Gwenlyn Found followed by mitral valve repair and Maze procedure next week.  Because of the ongoing COVID-19 pandemic I contacted the patient via telephone to check and see how he is doing.  He reports that he is doing exceptionally well.  He checks his pulse and blood pressure every morning and to his knowledge he has been maintaining slow steady pulse consistent with sinus rhythm.  His medications were recently adjusted by Kerin Ransom at Dr. Kennon Holter office.  He reports that his weight is down 20 pounds and his breathing is better than it has been in a long time.  He is working in his International aid/development worker and he does not experience shortness of breath.  He can lie flat in bed.  Lower extremity edema has virtually completely resolved.  He denies any recent fevers or cough.  He has not been traveling and to his knowledge she has not been exposed to anybody with known COVID-19 infection.   Current Outpatient Medications  Medication Sig Dispense Refill  . ALPRAZolam (XANAX) 0.5 MG tablet Take 0.5 mg by mouth at bedtime as needed for anxiety.    Marland Kitchen amiodarone (PACERONE) 200 MG tablet Take 1 tablet (200 mg  total) by mouth daily. 30 tablet 1  . apixaban (ELIQUIS) 5 MG TABS tablet Take 1 tablet (5 mg total) by mouth 2 (two) times daily. 180 tablet 3  . Docusate Sodium (COLACE PO) Take 3 capsules by mouth every morning. Over the counter     . metoprolol tartrate (LOPRESSOR) 50 MG tablet Take 1 tablet (50 mg total) by mouth 2 (two) times daily. 30 tablet 1  . Multiple Vitamins-Minerals (MULTIVITAMIN PO) Take 1 tablet by mouth daily.    Marland Kitchen OVER THE COUNTER MEDICATION Sleep aid - diphenhydramine    . potassium chloride (K-DUR) 10 MEQ tablet Take 1 tablet (10 mEq total) by mouth 2 (two) times daily. 60 tablet 5  . sacubitril-valsartan (ENTRESTO) 24-26 MG Take 1 tablet by mouth 2 (two) times daily. 60 tablet 6  . spironolactone (ALDACTONE) 25 MG tablet Take 1 tablet (25 mg total) by mouth daily. 30 tablet 6  . torsemide (DEMADEX) 20 MG tablet Take 1 tablet (20 mg total) by mouth daily. 30 tablet 6   No current facility-administered medications for this visit.       Physical Exam:  N/A  Diagnostic Tests:  N/A   Impression:  Patient sounds to be doing very well clinically at home on current medical therapy, likely maintaining sinus rhythm since he underwent recent DC cardioversion.   Plan:  I discussed at length with patient current issues regarding the COVID-19 pandemic particularly  with respect to current guidelines for proceeding with elective surgical procedures.  As long as the patient remains clinically stable it seems reasonable to postpone surgery for a few weeks.  However, the patient's mitral regurgitation is quite severe and he had significant left ventricular dysfunction noted at the time of his recent presentation.  I am concerned that prolonged delay could potentially be problematic, and the patient will be at risk for development of worsening congestive heart failure with or without recurrence of atrial fibrillation.  He will need to be followed very closely until his surgery can be  performed.  We will postpone his office appointment and surgery previously scheduled for next week.  We will recheck in 2 weeks to see how he is doing.  I have suggested that he contact Dr. Kennon Holter office to inquire about whether or not his left and right heart catheterization will be postponed.  If his catheterization is to be postponed then I have encouraged the patient to continue to take Eliquis until further notice.  All questions answered.  I spent in excess of 15 minutes during the conduct of this office consultation and >50% of this time involved coordination of their care.    Valentina Gu. Roxy Manns, MD 01/23/2019 11:46 AM

## 2019-01-25 ENCOUNTER — Ambulatory Visit: Payer: BLUE CROSS/BLUE SHIELD | Admitting: Cardiovascular Disease

## 2019-01-30 ENCOUNTER — Ambulatory Visit: Payer: BLUE CROSS/BLUE SHIELD | Admitting: Thoracic Surgery (Cardiothoracic Vascular Surgery)

## 2019-02-02 ENCOUNTER — Telehealth (INDEPENDENT_AMBULATORY_CARE_PROVIDER_SITE_OTHER): Payer: BLUE CROSS/BLUE SHIELD | Admitting: Thoracic Surgery (Cardiothoracic Vascular Surgery)

## 2019-02-02 ENCOUNTER — Encounter: Payer: Self-pay | Admitting: Thoracic Surgery (Cardiothoracic Vascular Surgery)

## 2019-02-02 DIAGNOSIS — I340 Nonrheumatic mitral (valve) insufficiency: Secondary | ICD-10-CM | POA: Diagnosis not present

## 2019-02-02 NOTE — Telephone Encounter (Signed)
[address]       [place] [address]             [phone]     CARDIOTHORACIC SURGERY TELEPHONE VIRTUAL OFFICE NOTE  Referring Provider is No ref. provider found Primary Cardiologist is Quay Burow, MD PCP is [name], MD   HPI:  I spoke with Shary Decamp via telephone on 02/02/2019 at 10:37 AM and verified that I was speaking with the correct person.  I contacted the patient to check on his clinical condition with regard to his underlying severe mitral regurgitation.  He reports that since our last discussion on January 23, 2019 he has remained clinically stable.  He states that his weight Gradually decreasing but has stabilized over the past week.  He has no lower extremity edema.  He denies any shortness of breath.  He has not had any orthopnea and he reports that he can sleep comfortably in bed.  He does note that he seems to tire easily, but overall he feels better than he did when he left the hospital several weeks ago.  He checks his pulse twice daily and he states that it remains regular, typically anywhere from 60 to 80 bpm.  He denies any fevers, chills, or productive cough.  He has not been around any persons with known or suspected COVID-19 infection.   Current Outpatient Medications  Medication Sig Dispense Refill  . ALPRAZolam (XANAX) 0.5 MG tablet Take 0.5 mg by mouth at bedtime as needed for anxiety.    Marland Kitchen amiodarone (PACERONE) 200 MG tablet Take 1 tablet (200 mg total) by mouth daily. 30 tablet 1  . apixaban (ELIQUIS) 5 MG TABS tablet Take 1 tablet (5 mg total) by mouth 2 (two) times daily. 180 tablet 3  . Docusate Sodium (COLACE PO) Take 3 capsules by mouth every morning. Over the counter     . metoprolol tartrate (LOPRESSOR) 50 MG tablet Take 1 tablet (50 mg total) by mouth 2 (two) times daily. 30 tablet 1  . Multiple Vitamins-Minerals (MULTIVITAMIN PO) Take 1 tablet by mouth daily.    Marland Kitchen OVER THE COUNTER MEDICATION Sleep aid -  diphenhydramine    . potassium chloride (K-DUR) 10 MEQ tablet Take 1 tablet (10 mEq total) by mouth 2 (two) times daily. 60 tablet 5  . sacubitril-valsartan (ENTRESTO) 24-26 MG Take 1 tablet by mouth 2 (two) times daily. 60 tablet 6  . spironolactone (ALDACTONE) 25 MG tablet Take 1 tablet (25 mg total) by mouth daily. 30 tablet 6  . torsemide (DEMADEX) 20 MG tablet Take 1 tablet (20 mg total) by mouth daily. 30 tablet 6   No current facility-administered medications for this visit.      Diagnostic Tests:  n/a   Impression:  Based upon the patient's account he seems to be doing well and remaining clinically stable.   Plan:  As long as the patient continues to remain clinically stable, we will continue to postpone surgical intervention until the restrictions related to the COVID-19 pandemic have been lifted.  We will plan to reschedule his next follow-up office visit for approximately 4 weeks.  During the interim he will call if symptoms worsen to any significant degree.   I discussed limitations of evaluation and management via telephone.  The patient was advised to call back or seek an in-person evaluation if the patient's clinical condition changes in any significant manner.  I spent in excess of 15 minutes of non-face-to-face  time during the conduct of this telephone virtual office consultation.    Valentina Gu. Roxy Manns, MD 02/02/2019 10:37 AM

## 2019-02-06 ENCOUNTER — Ambulatory Visit: Payer: BLUE CROSS/BLUE SHIELD | Admitting: Thoracic Surgery (Cardiothoracic Vascular Surgery)

## 2019-02-08 DIAGNOSIS — K6289 Other specified diseases of anus and rectum: Secondary | ICD-10-CM | POA: Diagnosis not present

## 2019-02-08 DIAGNOSIS — I340 Nonrheumatic mitral (valve) insufficiency: Secondary | ICD-10-CM | POA: Diagnosis not present

## 2019-02-08 DIAGNOSIS — M5417 Radiculopathy, lumbosacral region: Secondary | ICD-10-CM | POA: Diagnosis not present

## 2019-02-15 ENCOUNTER — Telehealth: Payer: Self-pay | Admitting: Gastroenterology

## 2019-02-15 NOTE — Telephone Encounter (Signed)
Pt called in and stated that he is having sever hemorrhoids and need to be seen.

## 2019-02-15 NOTE — Telephone Encounter (Signed)
Attempted to call patient twice with busy signal. Will continue to try and reach patient.

## 2019-02-16 NOTE — Telephone Encounter (Signed)
Pt said he was returning your call °

## 2019-02-16 NOTE — Telephone Encounter (Signed)
Attempted to call patient again twice with busy signal.

## 2019-02-16 NOTE — Telephone Encounter (Signed)
Patient is c/o hemorrhoids.  He was started on suppositories by his primary care.  He has had little relief.  We discussed continuing the suppositories adding SITZ baths BID and Recticare.  He will try this for one week and if there is no relief he will call back.

## 2019-02-27 ENCOUNTER — Encounter: Payer: Self-pay | Admitting: Thoracic Surgery (Cardiothoracic Vascular Surgery)

## 2019-02-27 ENCOUNTER — Telehealth: Payer: Self-pay | Admitting: Thoracic Surgery (Cardiothoracic Vascular Surgery)

## 2019-02-27 NOTE — Telephone Encounter (Signed)
Called to check on patient regarding symptoms related to his underlying severe primary mitral regurgitation.  He reports that he remains clinically stable.  He checks his blood pressure and pulse regularly and he has not had any episodes of rapid heartbeat to suggest a recurrence of atrial fibrillation.  He gets short of breath with exertion but his symptoms are stable and have not been increasing.  He did state that he had gained a couple of pounds in weight.  We will plan another follow-up virtual office visit next week to see how he is doing and consider whether or not his symptoms are progressing to the point where we need to consider proceeding with surgery sooner rather than later.  All questions answered.  Rexene Alberts, MD 02/27/2019 11:44 AM

## 2019-02-28 NOTE — Telephone Encounter (Signed)
Patient is scheduled for OV on 03/02/19 at 10:30.  He is aware.

## 2019-02-28 NOTE — Telephone Encounter (Signed)
Patient called said that he is still in pain would like to know what else he can use for the Hemorrhoids.

## 2019-02-28 NOTE — Telephone Encounter (Signed)
Please schedule an in person visit. Needs DRE and likely an anoscopy. May need banding.

## 2019-02-28 NOTE — Telephone Encounter (Signed)
Dr. Fuller Plan, please see previous message.  Please advise on additional care for hemorrhoids.

## 2019-03-01 ENCOUNTER — Telehealth: Payer: Self-pay

## 2019-03-01 NOTE — Telephone Encounter (Signed)
Covid-19 travel screening questions  Have you traveled in the last 14 days? No If yes where?  Do you now or have you had a fever in the last 14 days? No  Do you have any respiratory symptoms of shortness of breath or cough now or in the last 14 days? No  Do you have a medical history of Congestive Heart Failure? Yes  Do you have a medical history of lung disease? No  Do you have any family members or close contacts with diagnosed or suspected Covid-19? No

## 2019-03-02 ENCOUNTER — Ambulatory Visit (INDEPENDENT_AMBULATORY_CARE_PROVIDER_SITE_OTHER): Payer: BLUE CROSS/BLUE SHIELD | Admitting: Gastroenterology

## 2019-03-02 ENCOUNTER — Other Ambulatory Visit: Payer: Self-pay

## 2019-03-02 ENCOUNTER — Encounter: Payer: Self-pay | Admitting: Gastroenterology

## 2019-03-02 VITALS — BP 110/70 | HR 72 | Temp 97.9°F | Ht 75.0 in | Wt 249.0 lb

## 2019-03-02 DIAGNOSIS — K602 Anal fissure, unspecified: Secondary | ICD-10-CM

## 2019-03-02 DIAGNOSIS — K625 Hemorrhage of anus and rectum: Secondary | ICD-10-CM

## 2019-03-02 DIAGNOSIS — K6289 Other specified diseases of anus and rectum: Secondary | ICD-10-CM | POA: Diagnosis not present

## 2019-03-02 MED ORDER — DILTIAZEM GEL 2 %: 1.0000 "application " | Freq: Three times a day (TID) | CUTANEOUS | 1 refills | Status: DC

## 2019-03-02 NOTE — Patient Instructions (Addendum)
We have sent a prescription for Diltiazem 2 % gel to [facility]. You should apply a pea size amount to your rectum three times daily x 8 weeks.  [facility]'s information is below: Address: [address], [place], [place] [address]  [phone]  *Please DO NOT go directly from our office to pick up this medication! Give the pharmacy 1 day to process the prescription as this is compounded at takes time to make.  You can continue recti-care but apply separately from diltiazem gel.   Start a stool softener (colace) daily.   Increase your water intake daily.

## 2019-03-02 NOTE — Progress Notes (Signed)
    History of Present Illness: This is a 63 year old male with severe ana/rectal pain for 6 weeks.  He was hospitalized in February 2836 for acute systolic heart failure and Afib/A flutter.  He was discharged after a 10 kg diuresis on amiodarone and Eliquis.  He was seen in follow-up in cardiology on March 11 and is doing well, weight remains stable.  He relates anal and rectal pain which is severe with bowel movements and is moderate at other times during the day particularly when sitting in his car.  His symptoms have been persistent on a regular basis for 6 weeks.  He has noted occasional small amounts of bright red blood per rectum over the past couple of years with bowel movements and this pattern has not changed.  He denies constipation, diarrhea or straining with bowel movements.  No other gastrointestinal complaints  Colonoscopy 09/2017 - One 6 mm polyp in the sigmoid colon, removed with a cold snare. Resected and retrieved. - One 5 mm polyp in the cecum, removed with a cold biopsy forceps. Resected and retrieved. - Moderate diverticulosis in the sigmoid colon, in the descending colon and in the transverse colon.  - Internal hemorrhoids. - The examination was otherwise normal on direct and retroflexion views.  Current Medications, Allergies, Past Medical History, Past Surgical History, Family History and Social History were reviewed in [employer] record.   Physical Exam: General: Well developed, well nourished, no acute distress Head: Normocephalic and atraumatic Eyes:  sclerae anicteric, EOMI Ears: Normal auditory acuity Mouth: No deformity or lesions Lungs: Clear throughout to auscultation Heart: Regular rate and rhythm; no murmurs, rubs or bruits Abdomen: Soft, non tender and non distended. No masses, hepatosplenomegaly or hernias noted. Normal Bowel sounds Rectal: Posterior anal fissure with a very tender anal canal not allowing a complete DRE.  Small  external tags noted, Hemoccult negative brown stool in the vault Musculoskeletal: Symmetrical with no gross deformities  Pulses:  Normal pulses noted Extremities: No clubbing, cyanosis, edema or deformities noted Neurological: Alert oriented x 4, grossly nonfocal Psychological:  Alert and cooperative. Normal mood and affect   Assessment and Recommendations:  1. Anal pain due to a posterior anal fissure.  Begin Colace daily for the next [redacted] weeks along with increased daily water intake.  Begin diltiazem 2% gel PR 3 times  daily for at least 8 weeks even if symptoms abate.  Continue RectiCare PR 3 times daily as needed pain at times separated by at least 3 hours from diltiazem applications.  Patient is advised to call if symptoms are not steadily improving over the next few weeks.  REV in person in 6 weeks.   2.  Intermittent small-volume rectal bleeding likely due to known internal hemorrhoids.  3. Afib maintained on Eliquis and amiodarone.  4.  Personal history of adenomatous colon polyps.  A 5-year interval surveillance colonoscopy is recommended in November 2023.

## 2019-03-06 ENCOUNTER — Telehealth (INDEPENDENT_AMBULATORY_CARE_PROVIDER_SITE_OTHER): Payer: BLUE CROSS/BLUE SHIELD | Admitting: Thoracic Surgery (Cardiothoracic Vascular Surgery)

## 2019-03-06 ENCOUNTER — Other Ambulatory Visit: Payer: Self-pay

## 2019-03-06 ENCOUNTER — Encounter: Payer: Self-pay | Admitting: *Deleted

## 2019-03-06 ENCOUNTER — Telehealth: Payer: Self-pay

## 2019-03-06 ENCOUNTER — Other Ambulatory Visit: Payer: Self-pay | Admitting: *Deleted

## 2019-03-06 DIAGNOSIS — I4891 Unspecified atrial fibrillation: Secondary | ICD-10-CM

## 2019-03-06 DIAGNOSIS — I340 Nonrheumatic mitral (valve) insufficiency: Secondary | ICD-10-CM

## 2019-03-06 DIAGNOSIS — I341 Nonrheumatic mitral (valve) prolapse: Secondary | ICD-10-CM | POA: Diagnosis not present

## 2019-03-06 DIAGNOSIS — I5022 Chronic systolic (congestive) heart failure: Secondary | ICD-10-CM

## 2019-03-06 NOTE — Telephone Encounter (Signed)
Received message from Dr. Gwenlyn Found requesting pt be set up for virtual visit on 5/12 in anticipation of R/L heart cath on 5/14. Pt aware of appt time on 5/12      Virtual Visit Pre-Appointment Phone Call  "(Name), I am calling you today to discuss your upcoming appointment. We are currently trying to limit exposure to the virus that causes COVID-19 by seeing patients at home rather than in the office."  1. "What is the BEST phone number to call the day of the visit?" - include this in appointment notes  2. "Do you have or have access to (through a family member/friend) a smartphone with video capability that we can use for your visit?" a. If yes - list this number in appt notes as "cell" (if different from BEST phone #) and list the appointment type as a VIDEO visit in appointment notes b. If no - list the appointment type as a PHONE visit in appointment notes  3. Confirm consent - "In the setting of the current Covid19 crisis, you are scheduled for a (phone or video) visit with your provider on (date) at (time).  Just as we do with many in-office visits, in order for you to participate in this visit, we must obtain consent.  If you'd like, I can send this to your mychart (if signed up) or email for you to review.  Otherwise, I can obtain your verbal consent now.  All virtual visits are billed to your insurance company just like a normal visit would be.  By agreeing to a virtual visit, we'd like you to understand that the technology does not allow for your provider to perform an examination, and thus may limit your provider's ability to fully assess your condition. If your provider identifies any concerns that need to be evaluated in person, we will make arrangements to do so.  Finally, though the technology is pretty good, we cannot assure that it will always work on either your or our end, and in the setting of a video visit, we may have to convert it to a phone-only visit.  In either situation, we  cannot ensure that we have a secure connection.  Are you willing to proceed?" STAFF: Did the patient verbally acknowledge consent to telehealth visit? Document YES/NO here: yes  4. Advise patient to be prepared - "Two hours prior to your appointment, go ahead and check your blood pressure, pulse, oxygen saturation, and your weight (if you have the equipment to check those) and write them all down. When your visit starts, your provider will ask you for this information. If you have an Apple Watch or Kardia device, please plan to have heart rate information ready on the day of your appointment. Please have a pen and paper handy nearby the day of the visit as well."  5. Give patient instructions for MyChart download to smartphone OR Doximity/Doxy.me as below if video visit (depending on what platform provider is using)  6. Inform patient they will receive a phone call 15 minutes prior to their appointment time (may be from unknown caller ID) so they should be prepared to answer    TELEPHONE CALL NOTE  [name] has been deemed a candidate for a follow-up tele-health visit to limit community exposure during the Covid-19 pandemic. I spoke with the patient via phone to ensure availability of phone/video source, confirm preferred email & phone number, and discuss instructions and expectations.  I reminded [name] to be prepared with  any vital sign and/or heart rhythm information that could potentially be obtained via home monitoring, at the time of his visit. I reminded [name] to expect a phone call prior to his visit.  Annita Brod, RN 03/06/2019 3:58 PM   INSTRUCTIONS FOR DOWNLOADING THE MYCHART APP TO SMARTPHONE  - The patient must first make sure to have activated MyChart and know their login information - If Apple, go to [employer] and type in MyChart in the search bar and download the app. If Android, ask patient to go to [employer] and type in Vida in the search  bar and download the app. The app is free but as with any other app downloads, their phone may require them to verify saved payment information or Apple/Android password.  - The patient will need to then log into the app with their MyChart username and password, and select [facility] as their healthcare provider to link the account. When it is time for your visit, go to the MyChart app, find appointments, and click Begin Video Visit. Be sure to Select Allow for your device to access the Microphone and Camera for your visit. You will then be connected, and your provider will be with you shortly.  **If they have any issues connecting, or need assistance please contact MyChart service desk [phone])**  **If using a computer, in order to ensure the best quality for their visit they will need to use either of the following Internet Browsers: [employer], or Google Chrome**  IF USING DOXIMITY or DOXY.ME - The patient will receive a link just prior to their visit by text.     FULL LENGTH CONSENT FOR TELE-HEALTH VISIT   I hereby voluntarily request, consent and authorize [facility] and its employed or contracted physicians, physician assistants, nurse practitioners or other licensed health care professionals (the Practitioner), to provide me with telemedicine health care services (the "Services") as deemed necessary by the treating Practitioner. I acknowledge and consent to receive the Services by the Practitioner via telemedicine. I understand that the telemedicine visit will involve communicating with the Practitioner through live audiovisual communication technology and the disclosure of certain medical information by electronic transmission. I acknowledge that I have been given the opportunity to request an in-person assessment or other available alternative prior to the telemedicine visit and am voluntarily participating in the telemedicine visit.  I understand that I have the  right to withhold or withdraw my consent to the use of telemedicine in the course of my care at any time, without affecting my right to future care or treatment, and that the Practitioner or I may terminate the telemedicine visit at any time. I understand that I have the right to inspect all information obtained and/or recorded in the course of the telemedicine visit and may receive copies of available information for a reasonable fee.  I understand that some of the potential risks of receiving the Services via telemedicine include:  Marland Kitchen Delay or interruption in medical evaluation due to technological equipment failure or disruption; . Information transmitted may not be sufficient (e.g. poor resolution of images) to allow for appropriate medical decision making by the Practitioner; and/or  . In rare instances, security protocols could fail, causing a breach of personal health information.  Furthermore, I acknowledge that it is my responsibility to provide information about my medical history, conditions and care that is complete and accurate to the best of my ability. I acknowledge that Practitioner's advice, recommendations,  and/or decision may be based on factors not within their control, such as incomplete or inaccurate data provided by me or distortions of diagnostic images or specimens that may result from electronic transmissions. I understand that the practice of medicine is not an exact science and that Practitioner makes no warranties or guarantees regarding treatment outcomes. I acknowledge that I will receive a copy of this consent concurrently upon execution via email to the email address I last provided but may also request a printed copy by calling the office of [facility].    I understand that my insurance will be billed for this visit.   I have read or had this consent read to me. . I understand the contents of this consent, which adequately explains the benefits and risks of the Services  being provided via telemedicine.  . I have been provided ample opportunity to ask questions regarding this consent and the Services and have had my questions answered to my satisfaction. . I give my informed consent for the services to be provided through the use of telemedicine in my medical care  By participating in this telemedicine visit I agree to the above.

## 2019-03-06 NOTE — Progress Notes (Signed)
[address]       [place] [address]             [phone]     CARDIOTHORACIC SURGERY TELEPHONE VIRTUAL OFFICE NOTE  Referring Provider is Nahser, Wonda Cheng, MD Primary Cardiologist is Quay Burow, MD PCP is [name], MD   HPI:  I spoke with [name] (DOB 01/03/1956 ) via telephone on 03/06/2019 at 1:33 PM and verified that I was speaking with the correct person using more than one form of identification.  We discussed the reason(s) for conducting our visit virtually instead of in-person.  The patient expressed understanding the circumstances and agreed to proceed as described.  Patient is a 63 year old male with mitral valve prolapse and severe mitral regurgitation, hypertension, and degenerative arthritis who was recently hospitalized with decompensated acute on chronic combined systolic and diastolic congestive heart failure in the setting of presumably recent onset persistent atrial fibrillation and atrial flutter.  He underwent TEE and cardioversion and is clinically done well since sinus rhythm was restored.  I had the opportunity to see him in consultation on January 03, 2019.  At that time we made tentative plans for elective left and right heart catheterization by Dr. Gwenlyn Found followed by mitral valve repair and Maze procedure the end of March, but plans were postponed because of the ongoing COVID-19 pandemic.  I have spoken with him several times over the telephone of the past 3 weeks to make sure that he remains clinically stable.  Overall he reports that he is doing okay.  He checks his pulse and blood pressure every day and that seems to be quite stable.  There are no symptoms to suggest a recurrence of atrial fibrillation or atrial flutter.  He does get short of breath with exertion and he states that recently he has begun to feel more fatigued.  He denies any resting shortness of breath, PND, or orthopnea.  He has not had any chest pain or chest  tightness.  He denies any fevers or cough.  He has not been around any persons with known or suspected COVID-19 infection.   Current Outpatient Medications  Medication Sig Dispense Refill   ALPRAZolam (XANAX) 0.5 MG tablet Take 0.5 mg by mouth at bedtime as needed for anxiety.     amiodarone (PACERONE) 200 MG tablet Take 1 tablet (200 mg total) by mouth daily. 30 tablet 1   apixaban (ELIQUIS) 5 MG TABS tablet Take 1 tablet (5 mg total) by mouth 2 (two) times daily. 180 tablet 3   diltiazem 2 % GEL Apply 1 application topically 3 (three) times daily. With 5 % lidocaine 30 g 1   Docusate Sodium (COLACE PO) Take 3 capsules by mouth every morning. Over the counter      metoprolol tartrate (LOPRESSOR) 50 MG tablet Take 1 tablet (50 mg total) by mouth 2 (two) times daily. 30 tablet 1   Multiple Vitamins-Minerals (MULTIVITAMIN PO) Take 1 tablet by mouth daily.     OVER THE COUNTER MEDICATION Sleep aid - diphenhydramine     potassium chloride (K-DUR) 10 MEQ tablet Take 1 tablet (10 mEq total) by mouth 2 (two) times daily. 60 tablet 5   sacubitril-valsartan (ENTRESTO) 24-26 MG Take 1 tablet by mouth 2 (two) times daily. 60 tablet 6   spironolactone (ALDACTONE) 25 MG tablet Take 1 tablet (25 mg total) by mouth daily. 30 tablet 6   torsemide (DEMADEX) 20 MG tablet Take 1 tablet (  20 mg total) by mouth daily. 30 tablet 6   No current facility-administered medications for this visit.      Diagnostic Tests:  n/a   Impression:  Patient has mitral valve prolapse with stage D severe symptomatic primary mitral regurgitation.  He was hospitalized approximately 2 months ago with acute exacerbation of likely chronic combined systolic and diastolic congestive heart failure that occurred in the setting of presumably recent onset persistent atrial fibrillation and atrial flutter.  He underwent cardioversion during his hospitalization and diuresed a large amount of fluid.  He has remained clinically  stable since hospital discharge although he continues to have symptoms of exertional shortness of breath and increasing fatigue.  He denies symptoms suggestive of recurrence of atrial fibrillation or atrial flutter.  Previously reviewed images from the patient's transthoracic and transesophageal echocardiograms revealed large flail segment involving the middle scallop of the posterior leaflet of mitral valve with severe mitral regurgitation.  There was moderate to severe left ventricular systolic dysfunction and severe biatrial enlargement.  Under the circumstances I would be concerned to delay plans for elective surgical intervention much longer.  The patient will need left and right heart catheterization prior to surgery.  The patient denies any symptoms of fever or cough.  He has not been traveling and he has been practicing strict social distancing guidelines.  He has not been exposed to any persons with known or suspected COVID-19 infection.    Plan:  I discussed the indications, risk, and potential benefits of mitral valve repair and Maze procedure with the patient over the telephone today.  Timing of surgery regards to his clinical condition and the ongoing COVID-19 pandemic have been discussed.  Both the patient and I agree that it would be best to proceed with surgery sooner rather than later and at this point I would be concerned about delaying surgical intervention more than couple of weeks at most.  We tentatively plan to proceed with surgery on Mar 21, 2019.  Prior to that the patient will need to undergo left and right heart catheterization.  This will be arranged by Dr. Gwenlyn Found.  The patient understands that he will need to stop taking Eliquis at least 48 hours prior to his catheterization procedure.  We will plan that the patient will not resume taking Eliquis after his catheterization.    I discussed limitations of evaluation and management via telephone.  The patient was advised to call  back for repeat telephone consultation or to seek an in-person evaluation if questions arise or the patient's clinical condition changes in any significant manner.  I spent in excess of 10 minutes of non-face-to-face time during the conduct of this telephone virtual office consultation.    Valentina Gu. Roxy Manns, MD 03/06/2019 1:33 PM

## 2019-03-06 NOTE — Patient Instructions (Signed)
Continue all previous medications without any changes at this time  

## 2019-03-13 ENCOUNTER — Inpatient Hospital Stay (HOSPITAL_COMMUNITY): Admission: RE | Admit: 2019-03-13 | Payer: BLUE CROSS/BLUE SHIELD | Source: Ambulatory Visit

## 2019-03-13 ENCOUNTER — Telehealth: Payer: Self-pay | Admitting: Gastroenterology

## 2019-03-13 NOTE — Telephone Encounter (Signed)
Left message for patient to call back  

## 2019-03-13 NOTE — Telephone Encounter (Signed)
Patient called said that the med diltiazem 2 % GEL is not helping at all. Also said it feels worse and has never felt so much pain like this before

## 2019-03-14 ENCOUNTER — Telehealth: Payer: Self-pay

## 2019-03-14 ENCOUNTER — Encounter: Payer: Self-pay | Admitting: Cardiovascular Disease

## 2019-03-14 ENCOUNTER — Telehealth (INDEPENDENT_AMBULATORY_CARE_PROVIDER_SITE_OTHER): Payer: BLUE CROSS/BLUE SHIELD | Admitting: Cardiovascular Disease

## 2019-03-14 VITALS — BP 129/79 | HR 75 | Ht 75.0 in | Wt 241.0 lb

## 2019-03-14 DIAGNOSIS — I4892 Unspecified atrial flutter: Secondary | ICD-10-CM | POA: Diagnosis not present

## 2019-03-14 DIAGNOSIS — I5022 Chronic systolic (congestive) heart failure: Secondary | ICD-10-CM | POA: Diagnosis not present

## 2019-03-14 DIAGNOSIS — I5023 Acute on chronic systolic (congestive) heart failure: Secondary | ICD-10-CM

## 2019-03-14 DIAGNOSIS — I5021 Acute systolic (congestive) heart failure: Secondary | ICD-10-CM | POA: Diagnosis not present

## 2019-03-14 DIAGNOSIS — I77810 Thoracic aortic ectasia: Secondary | ICD-10-CM

## 2019-03-14 DIAGNOSIS — I340 Nonrheumatic mitral (valve) insufficiency: Secondary | ICD-10-CM

## 2019-03-14 DIAGNOSIS — I10 Essential (primary) hypertension: Secondary | ICD-10-CM

## 2019-03-14 DIAGNOSIS — Z01812 Encounter for preprocedural laboratory examination: Secondary | ICD-10-CM

## 2019-03-14 MED ORDER — ASPIRIN EC 81 MG PO TBEC: 81.0000 mg | DELAYED_RELEASE_TABLET | Freq: Once | ORAL | 0 refills | Status: AC

## 2019-03-14 NOTE — Telephone Encounter (Signed)
Patient and/or DPR-approved person aware of AVS instructions and verbalized understanding. AVS also released to MyChart and pt states he will review. Aware to present to [facility] office tomorrow for STAT labs

## 2019-03-14 NOTE — Patient Instructions (Addendum)
    [place] [place] [address] Dept: [phone] Loc: [phone]  [name]  03/14/2019  You are scheduled for a Cardiac Catheterization on Thursday, May 14 with Dr. Sherren Mocha.  1. Please arrive at the [facility] (Main Entrance A) at [facility]: [address] [place], [place] [address] at 5:30 AM (This time is two hours before your procedure to ensure your preparation). Free valet parking service is available.   Special note: Every effort is made to have your procedure done on time. Please understand that emergencies sometimes delay scheduled procedures.  2. Diet: Do not eat solid foods after midnight.  The patient may have clear liquids until 5am upon the day of the procedure.  3. Labs: You will need to have blood drawn today (Wednesday 5/12) or tomorrow (Thursday 5/13). CBC, TSH, and BMP   4. Medication instructions in preparation for your procedure:  Stop taking Eliquis (Apixiban) on Tuesday, May 12. Hold 2 days before procedure. You will receive instructions in the hospital on when to resume this medication.  Hold torsemide (Demadex) on the morning of the procedure.  Hold spironolactone (Aldactone) on the morning of the procedure.  Hold potassium chloride (Klor-Con) on the morning of the procedure.  On the morning of your procedure, take your Aspirin 81 mg and any morning medicines NOT listed above.  You may use sips of water.  5. Plan for one night stay--bring personal belongings. 6. Bring a current list of your medications and current insurance cards. 7. You MUST have a responsible person to drive you home. 8. Someone MUST be with you the first 24 hours after you arrive home or your discharge will be delayed. 9. Please wear clothes that are easy to get on and off and wear slip-on shoes.  Thank you for allowing [place] to care for you!   -- [facility]  Follow-Up: At [facility], you and your health needs are our priority.  As part of our continuing mission to provide you with exceptional heart care, we have created designated Provider Care Teams.  These Care Teams include your primary Cardiologist (physician) and Advanced Practice Providers (APPs -  Physician Assistants and Nurse Practitioners) who all work together to provide you with the care you need, when you need it. You will need a follow up appointment in 3 months with Dr. Gwenlyn Found .  Please call our office 2 months in advance to schedule this appointment.

## 2019-03-14 NOTE — Telephone Encounter (Signed)
Patient notified that his recommendation is to make a surgical referral to [facility].  Patient wants to wait.  He is having mitral valve surgery next week.  He states today he is feeling much better.  He thanked me for the call.  He reports he will call me back if he still has pain after the mitral valve surgery

## 2019-03-14 NOTE — Progress Notes (Signed)
Virtual Visit via Video Note   This visit type was conducted due to national recommendations for restrictions regarding the COVID-19 Pandemic (e.g. social distancing) in an effort to limit this patient's exposure and mitigate transmission in our community.  Due to his co-morbid illnesses, this patient is at least at moderate risk for complications without adequate follow up.  This format is felt to be most appropriate for this patient at this time.  All issues noted in this document were discussed and addressed.  A limited physical exam was performed with this format.  Please refer to the patient's chart for his consent to telehealth for [facility].   Date:  03/14/2019   ID:  [name], DOB [birth year], MRN [id number]  Patient Location: Other:  At his place of business Provider Location: Home  PCP:  [name], MD  Cardiologist:  Quay Burow, MD  Electrophysiologist:  None   Evaluation Performed:  Follow-Up Visit  Chief Complaint: Systolic heart failure, mitral regurgitation, PAF  History of Present Illness:    [name] is a 63 y.o.  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 He was referred by Dr. Coletta Memos for cardiovascular evaluation because of hypertension and mitral regurgitation.  I last saw him in the office 12/30/2018.  His only cardiac risk factor is treated hypertension. He does not smoke. He's never had a heart attack or stroke. He is on 3 antihypertensive medications and is aware of some restriction. His last 2-D echocardiogram performed 11/25/12 revealed mildly dilated left ventricle with normal systolic function, moderate to severe MR and severe left atrial enlargement. He remains in sinus rhythm and otherwise is asymptomatic.    When I saw him in the office last he was in A. fib with RVR   He does complain of some increasing shortness of breath as well.  He appeared to be volume  overloaded, and and heart failure.  I arrange for him to be admitted to the hospital where he was diuresed and underwent TEE guided DC cardioversion back to sinus rhythm.  He is on Eliquis.  He was seen by Dr. Roxy Manns who agreed that he needed mitral valve repair.  His EF was 35 to 40% with severe MR and a flail P2 segment of the posterior leaflet with a ruptured chordae.  He is lost from 287 down to 241 pounds.  He is in sinus rhythm clinically and is improved.  The patient does not have symptoms concerning for COVID-19 infection (fever, chills, cough, or new shortness of breath).    Past Medical History:  Diagnosis Date   Acute on chronic systolic (congestive) heart failure (HCC) 12/30/2018   Allergy    Anxiety    Arthritis    Atrial flutter with rapid ventricular response (HCC)    Chronic systolic (congestive) heart failure (HCC)    Dilated aortic root ([place]) 01/04/2019   Dilated cardiomyopathy ([place]) 01/04/2019   GERD (gastroesophageal reflux disease)    Heart murmur    History of colon polyps    Hypertension    Insomnia    Mitral valve prolapse    Mitral valve regurgitation    Persistent atrial fibrillation 12/02/2018   Seizures ([place])    had one approx. 30 yrs ago,has not had any since   Severe mitral regurgitation    Past Surgical History:  Procedure Laterality Date   ANKLE FUSION  left   Dec. 2012   ARTHROSCOPY  KNEE W/ DRILLING  bilateral   2012   CARDIOVERSION N/A 01/03/2019   Procedure: CARDIOVERSION;  Surgeon: Sueanne Margarita, MD;  Location: [facility] ENDOSCOPY;  Service: Cardiovascular;  Laterality: N/A;   COLONOSCOPY     x2   FOOT ARTHRODESIS  06/23/2012   Procedure: ARTHRODESIS FOOT;  Surgeon: Wylene Simmer, MD;  Location: [place];  Service: Orthopedics;  Laterality: Left;  Left Subtalar and Talonavicular Joint Revision Arthrodesis  Aspiration of Bone Marrow from Left Hip    HAIR TRANSPLANT     HARDWARE REMOVAL  06/23/2012   Procedure: HARDWARE REMOVAL;  Surgeon:  Wylene Simmer, MD;  Location: [place];  Service: Orthopedics;  Laterality: Left;  Removal of Deep Implant  X's 3   INSERTION OF MESH N/A 07/10/2016   Procedure: INSERTION OF MESH;  Surgeon: Coralie Keens, MD;  Location: [place];  Service: General;  Laterality: N/A;   JOINT REPLACEMENT     right hip  01-2011   LIMB SPARING RESECTION HIP W/ SADDLE JOINT REPLACEMENT Right    TEE WITHOUT CARDIOVERSION N/A 01/03/2019   Procedure: TRANSESOPHAGEAL ECHOCARDIOGRAM (TEE);  Surgeon: Sueanne Margarita, MD;  Location: [facility] ENDOSCOPY;  Service: Cardiovascular;  Laterality: N/A;   TOTAL KNEE ARTHROPLASTY Right 01/19/2014   Procedure: RIGHT TOTAL KNEE ARTHROPLASTY, Steroid injection left knee;  Surgeon: Mcarthur Rossetti, MD;  Location: [facility];  Service: Orthopedics;  Laterality: Right;   TOTAL KNEE ARTHROPLASTY Left 10/23/2014   Procedure: LEFT TOTAL KNEE ARTHROPLASTY;  Surgeon: Mcarthur Rossetti, MD;  Location: [facility];  Service: Orthopedics;  Laterality: Left;   UMBILICAL HERNIA REPAIR N/A 07/10/2016   Procedure: UMBILICAL HERNIA REPAIR;  Surgeon: Coralie Keens, MD;  Location: [place];  Service: General;  Laterality: N/A;     Current Meds  Medication Sig   ALPRAZolam (XANAX) 0.5 MG tablet Take 0.5 mg by mouth at bedtime.    amiodarone (PACERONE) 200 MG tablet Take 1 tablet (200 mg total) by mouth daily.   apixaban (ELIQUIS) 5 MG TABS tablet Take 1 tablet (5 mg total) by mouth 2 (two) times daily.   diltiazem 2 % GEL Apply 1 application topically 3 (three) times daily. With 5 % lidocaine (Patient taking differently: Apply 1 application topically 3 (three) times daily as needed (rectal discomfort). With 5 % lidocaine)   diphenhydrAMINE (SOMINEX) 25 MG tablet Take 25 mg by mouth at bedtime.   docusate sodium (COLACE) 100 MG capsule Take 300 mg by mouth daily.   HYDROcodone-acetaminophen (NORCO) 10-325 MG tablet Take 1 tablet by mouth every 8 (eight) hours as  needed.   KLOR-CON M10 10 MEQ tablet Take 10 mEq by mouth 2 (two) times daily.   metoprolol tartrate (LOPRESSOR) 100 MG tablet Take 50 mg by mouth 2 (two) times daily.   Multiple Vitamin (MULTIVITAMIN WITH MINERALS) TABS tablet Take 1 tablet by mouth daily.   sacubitril-valsartan (ENTRESTO) 24-26 MG Take 1 tablet by mouth 2 (two) times daily.   spironolactone (ALDACTONE) 25 MG tablet Take 1 tablet (25 mg total) by mouth daily.   torsemide (DEMADEX) 20 MG tablet Take 1 tablet (20 mg total) by mouth daily.     Allergies:   Patient has no known allergies.   Social History   Tobacco Use   Smoking status: Never Smoker   Smokeless tobacco: Never Used  Substance Use Topics   Alcohol use: Yes    Comment: 1 drink daily   Drug use: No     Family Hx:  The patient's family history includes Colon cancer in his father; Diabetes in his mother; Hypertension in his father. There is no history of Stomach cancer, Esophageal cancer, Pancreatic cancer, or Rectal cancer.  ROS:   Please see the history of present illness.     All other systems reviewed and are negative.   Prior CV studies:   The following studies were reviewed today:  Transesophageal echo  Labs/Other Tests and Data Reviewed:    EKG:  An ECG dated 01/11/2019 was personally reviewed today and demonstrated:  Sinus bradycardia 49 with right bundle branch block and left axis deviation  Recent Labs: 12/30/2018: B Natriuretic Peptide 1,365.8; Hemoglobin 13.0; Platelets 150 12/31/2018: Magnesium 2.0 01/03/2019: ALT 18 01/04/2019: BUN 21; Creatinine, Ser 1.24; Potassium 3.4; Sodium 142   Recent Lipid Panel No results found for: CHOL, TRIG, HDL, CHOLHDL, LDLCALC, LDLDIRECT  Wt Readings from Last 3 Encounters:  03/14/19 241 lb (109.3 kg)  03/02/19 249 lb (112.9 kg)  01/11/19 251 lb (113.9 kg)     Objective:    Vital Signs:  BP 129/79    Pulse 75    Ht 6\' 3"  (1.905 m)    Wt 241 lb (109.3 kg)    BMI 30.12 kg/m    VITAL  SIGNS:  reviewed GEN:  no acute distress RESPIRATORY:  normal respiratory effort, symmetric expansion NEURO:  alert and oriented x 3, no obvious focal deficit PSYCH:  normal affect  ASSESSMENT & PLAN:    1. Systolic heart failure- EF is 35 to 40% with severe MR.  He was diuresed and placed on appropriate medications.  He has lost 45 pounds and is clinically improved.  He salt restriction and is on a diuretic.  He is also on a beta-blocker, Entresto and spironolactone. 2. Paroxysmal atrial fibrillation- maintaining sinus rhythm on amiodarone and Eliquis 3. Essential hypertension- blood pressure measured this morning by the patient was 127/78.  He is on beta-blocker and Entresto  COVID-19 Education: The signs and symptoms of COVID-19 were discussed with the patient and how to seek care for testing (follow up with PCP or arrange E-visit).  The importance of social distancing was discussed today.  Time:   Today, I have spent 9 minutes with the patient with telehealth technology discussing the above problems.     Medication Adjustments/Labs and Tests Ordered: Current medicines are reviewed at length with the patient today.  Concerns regarding medicines are outlined above.   Tests Ordered: No orders of the defined types were placed in this encounter.   Medication Changes: No orders of the defined types were placed in this encounter.   Disposition:  Follow up in 3 month(s)  Signed, Quay Burow, MD  03/14/2019 12:34 PM    [place]

## 2019-03-14 NOTE — H&P (View-Only) (Signed)
Virtual Visit via Video Note   This visit type was conducted due to national recommendations for restrictions regarding the COVID-19 Pandemic (e.g. social distancing) in an effort to limit this patient's exposure and mitigate transmission in our community.  Due to his co-morbid illnesses, this patient is at least at moderate risk for complications without adequate follow up.  This format is felt to be most appropriate for this patient at this time.  All issues noted in this document were discussed and addressed.  A limited physical exam was performed with this format.  Please refer to the patient's chart for his consent to telehealth for [facility].   Date:  03/14/2019   ID:  EIN RIJO, DOB 05-29-1956, MRN [id number]  Patient Location: Other:  At his place of business Provider Location: Home  PCP:  [name], MD  Cardiologist:  Quay Burow, MD  Electrophysiologist:  None   Evaluation Performed:  Follow-Up Visit  Chief Complaint: Systolic heart failure, mitral regurgitation, PAF  History of Present Illness:    [name] is a 63 y.o.  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 He was referred by Dr. Coletta Memos for cardiovascular evaluation because of hypertension and mitral regurgitation.  I last saw him in the office 12/30/2018.  His only cardiac risk factor is treated hypertension. He does not smoke. He's never had a heart attack or stroke. He is on 3 antihypertensive medications and is aware of some restriction. His last 2-D echocardiogram performed 11/25/12 revealed mildly dilated left ventricle with normal systolic function, moderate to severe MR and severe left atrial enlargement. He remains in sinus rhythm and otherwise is asymptomatic.    When I saw him in the office last he was in A. fib with RVR   He does complain of some increasing shortness of breath as well.  He appeared to be volume  overloaded, and and heart failure.  I arrange for him to be admitted to the hospital where he was diuresed and underwent TEE guided DC cardioversion back to sinus rhythm.  He is on Eliquis.  He was seen by Dr. Roxy Manns who agreed that he needed mitral valve repair.  His EF was 35 to 40% with severe MR and a flail P2 segment of the posterior leaflet with a ruptured chordae.  He is lost from 287 down to 241 pounds.  He is in sinus rhythm clinically and is improved.  The patient does not have symptoms concerning for COVID-19 infection (fever, chills, cough, or new shortness of breath).    Past Medical History:  Diagnosis Date   Acute on chronic systolic (congestive) heart failure (HCC) 12/30/2018   Allergy    Anxiety    Arthritis    Atrial flutter with rapid ventricular response (HCC)    Chronic systolic (congestive) heart failure (HCC)    Dilated aortic root ([place]) 01/04/2019   Dilated cardiomyopathy ([place]) 01/04/2019   GERD (gastroesophageal reflux disease)    Heart murmur    History of colon polyps    Hypertension    Insomnia    Mitral valve prolapse    Mitral valve regurgitation    Persistent atrial fibrillation 12/02/2018   Seizures ([place])    had one approx. 30 yrs ago,has not had any since   Severe mitral regurgitation    Past Surgical History:  Procedure Laterality Date   ANKLE FUSION  left   Dec. 2012   ARTHROSCOPY  KNEE W/ DRILLING  bilateral   2012   CARDIOVERSION N/A 01/03/2019   Procedure: CARDIOVERSION;  Surgeon: Sueanne Margarita, MD;  [facility] ENDOSCOPY;  Service: Cardiovascular;  Laterality: N/A;   COLONOSCOPY     x2   FOOT ARTHRODESIS  06/23/2012   Procedure: ARTHRODESIS FOOT;  Surgeon: Wylene Simmer, MD;  Location: [place];  Service: Orthopedics;  Laterality: Left;  Left Subtalar and Talonavicular Joint Revision Arthrodesis  Aspiration of Bone Marrow from Left Hip    HAIR TRANSPLANT     HARDWARE REMOVAL  06/23/2012   Procedure: HARDWARE REMOVAL;  Surgeon:  Wylene Simmer, MD;  Location: [place];  Service: Orthopedics;  Laterality: Left;  Removal of Deep Implant  X's 3   INSERTION OF MESH N/A 07/10/2016   Procedure: INSERTION OF MESH;  Surgeon: Coralie Keens, MD;  Location: [place];  Service: General;  Laterality: N/A;   JOINT REPLACEMENT     right hip  01-2011   LIMB SPARING RESECTION HIP W/ SADDLE JOINT REPLACEMENT Right    TEE WITHOUT CARDIOVERSION N/A 01/03/2019   Procedure: TRANSESOPHAGEAL ECHOCARDIOGRAM (TEE);  Surgeon: Sueanne Margarita, MD;  [facility] ENDOSCOPY;  Service: Cardiovascular;  Laterality: N/A;   TOTAL KNEE ARTHROPLASTY Right 01/19/2014   Procedure: RIGHT TOTAL KNEE ARTHROPLASTY, Steroid injection left knee;  Surgeon: Mcarthur Rossetti, MD;  [facility];  Service: Orthopedics;  Laterality: Right;   TOTAL KNEE ARTHROPLASTY Left 10/23/2014   Procedure: LEFT TOTAL KNEE ARTHROPLASTY;  Surgeon: Mcarthur Rossetti, MD;  [facility];  Service: Orthopedics;  Laterality: Left;   UMBILICAL HERNIA REPAIR N/A 07/10/2016   Procedure: UMBILICAL HERNIA REPAIR;  Surgeon: Coralie Keens, MD;  Location: [place];  Service: General;  Laterality: N/A;     Current Meds  Medication Sig   ALPRAZolam (XANAX) 0.5 MG tablet Take 0.5 mg by mouth at bedtime.    amiodarone (PACERONE) 200 MG tablet Take 1 tablet (200 mg total) by mouth daily.   apixaban (ELIQUIS) 5 MG TABS tablet Take 1 tablet (5 mg total) by mouth 2 (two) times daily.   diltiazem 2 % GEL Apply 1 application topically 3 (three) times daily. With 5 % lidocaine (Patient taking differently: Apply 1 application topically 3 (three) times daily as needed (rectal discomfort). With 5 % lidocaine)   diphenhydrAMINE (SOMINEX) 25 MG tablet Take 25 mg by mouth at bedtime.   docusate sodium (COLACE) 100 MG capsule Take 300 mg by mouth daily.   HYDROcodone-acetaminophen (NORCO) 10-325 MG tablet Take 1 tablet by mouth every 8 (eight) hours as  needed.   KLOR-CON M10 10 MEQ tablet Take 10 mEq by mouth 2 (two) times daily.   metoprolol tartrate (LOPRESSOR) 100 MG tablet Take 50 mg by mouth 2 (two) times daily.   Multiple Vitamin (MULTIVITAMIN WITH MINERALS) TABS tablet Take 1 tablet by mouth daily.   sacubitril-valsartan (ENTRESTO) 24-26 MG Take 1 tablet by mouth 2 (two) times daily.   spironolactone (ALDACTONE) 25 MG tablet Take 1 tablet (25 mg total) by mouth daily.   torsemide (DEMADEX) 20 MG tablet Take 1 tablet (20 mg total) by mouth daily.     Allergies:   Patient has no known allergies.   Social History   Tobacco Use   Smoking status: Never Smoker   Smokeless tobacco: Never Used  Substance Use Topics   Alcohol use: Yes    Comment: 1 drink daily   Drug use: No     Family Hx:  The patient's family history includes Colon cancer in his father; Diabetes in his mother; Hypertension in his father. There is no history of Stomach cancer, Esophageal cancer, Pancreatic cancer, or Rectal cancer.  ROS:   Please see the history of present illness.     All other systems reviewed and are negative.   Prior CV studies:   The following studies were reviewed today:  Transesophageal echo  Labs/Other Tests and Data Reviewed:    EKG:  An ECG dated 01/11/2019 was personally reviewed today and demonstrated:  Sinus bradycardia 49 with right bundle branch block and left axis deviation  Recent Labs: 12/30/2018: B Natriuretic Peptide 1,365.8; Hemoglobin 13.0; Platelets 150 12/31/2018: Magnesium 2.0 01/03/2019: ALT 18 01/04/2019: BUN 21; Creatinine, Ser 1.24; Potassium 3.4; Sodium 142   Recent Lipid Panel No results found for: CHOL, TRIG, HDL, CHOLHDL, LDLCALC, LDLDIRECT  Wt Readings from Last 3 Encounters:  03/14/19 241 lb (109.3 kg)  03/02/19 249 lb (112.9 kg)  01/11/19 251 lb (113.9 kg)     Objective:    Vital Signs:  BP 129/79    Pulse 75    Ht 6\' 3"  (1.905 m)    Wt 241 lb (109.3 kg)    BMI 30.12 kg/m    VITAL  SIGNS:  reviewed GEN:  no acute distress RESPIRATORY:  normal respiratory effort, symmetric expansion NEURO:  alert and oriented x 3, no obvious focal deficit PSYCH:  normal affect  ASSESSMENT & PLAN:    1. Systolic heart failure- EF is 35 to 40% with severe MR.  He was diuresed and placed on appropriate medications.  He has lost 45 pounds and is clinically improved.  He salt restriction and is on a diuretic.  He is also on a beta-blocker, Entresto and spironolactone. 2. Paroxysmal atrial fibrillation- maintaining sinus rhythm on amiodarone and Eliquis 3. Essential hypertension- blood pressure measured this morning by the patient was 127/78.  He is on beta-blocker and Entresto  COVID-19 Education: The signs and symptoms of COVID-19 were discussed with the patient and how to seek care for testing (follow up with PCP or arrange E-visit).  The importance of social distancing was discussed today.  Time:   Today, I have spent 9 minutes with the patient with telehealth technology discussing the above problems.     Medication Adjustments/Labs and Tests Ordered: Current medicines are reviewed at length with the patient today.  Concerns regarding medicines are outlined above.   Tests Ordered: No orders of the defined types were placed in this encounter.   Medication Changes: No orders of the defined types were placed in this encounter.   Disposition:  Follow up in 3 month(s)  Signed, Quay Burow, MD  03/14/2019 12:34 PM    [name]

## 2019-03-14 NOTE — Addendum Note (Signed)
Addended by: Annita Brod on: 03/14/2019 03:40 PM   Modules accepted: Orders

## 2019-03-15 ENCOUNTER — Other Ambulatory Visit (HOSPITAL_COMMUNITY)
Admission: RE | Admit: 2019-03-15 | Discharge: 2019-03-15 | Disposition: A | Discharge status: Home / Self Care | Payer: BLUE CROSS/BLUE SHIELD | Source: Ambulatory Visit | Attending: Thoracic Surgery (Cardiothoracic Vascular Surgery) | Admitting: Thoracic Surgery (Cardiothoracic Vascular Surgery)

## 2019-03-15 ENCOUNTER — Telehealth: Payer: Self-pay | Admitting: *Deleted

## 2019-03-15 ENCOUNTER — Other Ambulatory Visit: Payer: Self-pay

## 2019-03-15 DIAGNOSIS — Z1159 Encounter for screening for other viral diseases: Secondary | ICD-10-CM | POA: Diagnosis not present

## 2019-03-15 DIAGNOSIS — Z01812 Encounter for preprocedural laboratory examination: Secondary | ICD-10-CM | POA: Diagnosis not present

## 2019-03-15 LAB — CBC
Hematocrit: 46.6 % (ref 37.5–51.0)
Hemoglobin: 15.5 g/dL (ref 13.0–17.7)
MCH: 31.3 pg (ref 26.6–33.0)
MCHC: 33.3 g/dL (ref 31.5–35.7)
MCV: 94 fL (ref 79–97)
Platelets: 193 10*3/uL (ref 150–450)
RBC: 4.96 x10E6/uL (ref 4.14–5.80)
RDW: 16.2 % — ABNORMAL HIGH (ref 11.6–15.4)
WBC: 7.2 10*3/uL (ref 3.4–10.8)

## 2019-03-15 LAB — BASIC METABOLIC PANEL
BUN/Creatinine Ratio: 28 — ABNORMAL HIGH (ref 10–24)
BUN: 38 mg/dL — ABNORMAL HIGH (ref 8–27)
CO2: 23 mmol/L (ref 20–29)
Calcium: 10 mg/dL (ref 8.6–10.2)
Chloride: 96 mmol/L (ref 96–106)
Creatinine, Ser: 1.35 mg/dL — ABNORMAL HIGH (ref 0.76–1.27)
GFR calc Af Amer: 64 mL/min/{1.73_m2} (ref >59)
GFR calc non Af Amer: 55 mL/min/{1.73_m2} — ABNORMAL LOW (ref >59)
Glucose: 99 mg/dL (ref 65–99)
Potassium: 5 mmol/L (ref 3.5–5.2)
Sodium: 138 mmol/L (ref 134–144)

## 2019-03-15 LAB — SARS CORONAVIRUS 2 BY RT PCR (HOSPITAL ORDER, PERFORMED IN ~~LOC~~ HOSPITAL LAB): SARS Coronavirus 2: NEGATIVE

## 2019-03-15 LAB — TSH: TSH: 4.42 u[IU]/mL (ref 0.450–4.500)

## 2019-03-15 NOTE — Telephone Encounter (Signed)
Pt contacted pre-catheterization scheduled at [facility] for: Thursday Mar 16, 2019 7:30 AM Verified arrival time and place: [place] Entrance A at: 5:30 AM Covid-19-[facility] 03/15/19 2:45 PM  No solid food after midnight prior to cath, clear liquids until 5 AM day of procedure. Contrast allergy: no  Hold: Eliquis-03/14/19 until post procedure Torsemide-AM of procedure KCl-AM of procedure Spironolactone-AM of procedure.  Except hold medications AM meds can be  taken pre-cath with sip of water including: ASA 81 mg  Confirmed patient has responsible person to drive home post procedure and observe 24 hours after arriving home: yes   Pt advised due to Covid-19 pandemic, [facility] is restricting visitors and only patients should present for check-in prior to their procedure. People will not be allowed to enter [facility] with the patient. At this time [facility] is not allowing visitors to all [facility] campuses.    Per Dr Leanne Chang Daily Covid Update 03/14/19: Universal masks: Effective immediately, we are requiring all ED patients and all ambulatory care patients to wear a universal mask, which we will provide. In-patients will have a mask at their bedside and will only be asked to put it on when a caregiver comes into their room.      COVID-19 Pre-Screening Questions:  . In the past 7 to 10 days have you had a cough,  shortness of breath, headache, congestion, fever, body aches, chills, sore throat, or sudden loss of taste or sense of smell? no . Have you been around anyone with known Covid 19? no . Have you been around anyone who is awaiting Covid 19 test results in the past 7 to 10 days? no . Have you been around anyone who has been exposed to Covid 19, or has mentioned symptoms of Covid 19 within the past 7 to 10 days? no  I reviewed procedure instructions, visitor/masking, and Covid-19 screening questions.

## 2019-03-15 NOTE — Pre-Procedure Instructions (Signed)
[name]  03/15/2019      [name], [place] - [name] [address] Phone: [phone] Fax: [place] [address], [place] [address] Phone: [phone] Fax: [phone]    Your procedure is scheduled on May 19  Report to [facility] Entrance A at 5:30  A.M.  Call this number if you have problems the morning of surgery:  [phone]   Remember:  Do not eat or drink after midnight.      Take these medicines the morning of surgery with A SIP OF WATER :              Amiodarone (pacerone)             Hydrocodone if needed             Metoprolol (lopressor)             7 days prior to surgery STOP taking any Aspirin (unless otherwise instructed by your surgeon), Aleve, Naproxen, Ibuprofen, Motrin, Advil, Goody's, BC's, all herbal medications, fish oil, and all vitamins.                 Do not wear jewelry.  Do not wear lotions, powders, or perfumes, or deodorant.  Do not shave 48 hours prior to surgery.  Men may shave face and neck.  Do not bring valuables to the hospital.  [facility] is not responsible for any belongings or valuables.  Contacts, dentures or bridgework may not be worn into surgery.  Leave your suitcase in the car.  After surgery it may be brought to your room.  For patients admitted to the hospital, discharge time will be determined by your treatment team.  Patients discharged the day of surgery will not be allowed to drive home.    Special instructions:  [name]  Before surgery, you can play an important role. Because skin is not sterile, your skin needs to be as free of germs as possible. You can reduce the number of germs on your skin by washing with CHG (chlorahexidine gluconate) Soap before surgery.  CHG is an antiseptic cleaner which kills germs and bonds with the  skin to continue killing germs even after washing.    Oral Hygiene is also important to reduce your risk of infection.  Remember - BRUSH YOUR TEETH THE MORNING OF SURGERY WITH YOUR REGULAR TOOTHPASTE  Please do not use if you have an allergy to CHG or antibacterial soaps. If your skin becomes reddened/irritated stop using the CHG.  Do not shave (including legs and underarms) for at least 48 hours prior to first CHG shower. It is OK to shave your face.  Please follow these instructions carefully.   1. Shower the NIGHT BEFORE SURGERY and the MORNING OF SURGERY with CHG.   2. If you chose to wash your hair, wash your hair first as usual with your normal shampoo.  3. After you shampoo, rinse your hair and body thoroughly to remove the shampoo.  4. Use CHG as you would any other liquid soap. You can apply CHG directly to the skin and wash gently with a scrungie or a clean washcloth.   5. Apply the CHG Soap to your body ONLY FROM THE NECK DOWN.  Do not use on open wounds or open sores. Avoid contact with your  eyes, ears, mouth and genitals (private parts). Wash Face and genitals (private parts)  with your normal soap.  6. Wash thoroughly, paying special attention to the area where your surgery will be performed.  7. Thoroughly rinse your body with warm water from the neck down.  8. DO NOT shower/wash with your normal soap after using and rinsing off the CHG Soap.  9. Pat yourself dry with a CLEAN TOWEL.  10. Wear CLEAN PAJAMAS to bed the night before surgery, wear comfortable clothes the morning of surgery  11. Place CLEAN SHEETS on your bed the night of your first shower and DO NOT SLEEP WITH PETS.    Day of Surgery:  Do not apply any deodorants/lotions.  Please wear clean clothes to the hospital/surgery center.   Remember to brush your teeth WITH YOUR REGULAR TOOTHPASTE.    Please read over the following fact sheets that you were given. Coughing and Deep Breathing, MRSA  Information and Surgical Site Infection Prevention

## 2019-03-16 ENCOUNTER — Encounter (HOSPITAL_COMMUNITY): Payer: Self-pay

## 2019-03-16 ENCOUNTER — Ambulatory Visit (HOSPITAL_COMMUNITY)
Admission: RE | Admit: 2019-03-16 | Discharge: 2019-03-16 | Disposition: A | Discharge status: Home / Self Care | Payer: BLUE CROSS/BLUE SHIELD | Attending: Cardiovascular Disease | Admitting: Cardiovascular Disease

## 2019-03-16 ENCOUNTER — Encounter (HOSPITAL_COMMUNITY)
Admission: RE | Disposition: A | Discharge status: Home / Self Care | Payer: Self-pay | Source: Home / Self Care | Attending: Cardiovascular Disease

## 2019-03-16 ENCOUNTER — Other Ambulatory Visit: Payer: Self-pay

## 2019-03-16 ENCOUNTER — Ambulatory Visit (HOSPITAL_BASED_OUTPATIENT_CLINIC_OR_DEPARTMENT_OTHER)
Admission: RE | Admit: 2019-03-16 | Discharge: 2019-03-16 | Disposition: A | Discharge status: Home / Self Care | Payer: BLUE CROSS/BLUE SHIELD | Source: Ambulatory Visit | Attending: Thoracic Surgery (Cardiothoracic Vascular Surgery) | Admitting: Thoracic Surgery (Cardiothoracic Vascular Surgery)

## 2019-03-16 ENCOUNTER — Ambulatory Visit (HOSPITAL_COMMUNITY): Payer: BLUE CROSS/BLUE SHIELD

## 2019-03-16 ENCOUNTER — Ambulatory Visit (HOSPITAL_COMMUNITY)
Admission: RE | Admit: 2019-03-16 | Discharge: 2019-03-16 | Disposition: A | Discharge status: Home / Self Care | Payer: BLUE CROSS/BLUE SHIELD | Source: Ambulatory Visit | Attending: Thoracic Surgery (Cardiothoracic Vascular Surgery) | Admitting: Thoracic Surgery (Cardiothoracic Vascular Surgery)

## 2019-03-16 DIAGNOSIS — I480 Paroxysmal atrial fibrillation: Secondary | ICD-10-CM | POA: Diagnosis not present

## 2019-03-16 DIAGNOSIS — I4891 Unspecified atrial fibrillation: Secondary | ICD-10-CM

## 2019-03-16 DIAGNOSIS — I420 Dilated cardiomyopathy: Secondary | ICD-10-CM | POA: Diagnosis not present

## 2019-03-16 DIAGNOSIS — I340 Nonrheumatic mitral (valve) insufficiency: Secondary | ICD-10-CM

## 2019-03-16 DIAGNOSIS — K219 Gastro-esophageal reflux disease without esophagitis: Secondary | ICD-10-CM | POA: Diagnosis not present

## 2019-03-16 DIAGNOSIS — E669 Obesity, unspecified: Secondary | ICD-10-CM | POA: Diagnosis not present

## 2019-03-16 DIAGNOSIS — I4892 Unspecified atrial flutter: Secondary | ICD-10-CM | POA: Insufficient documentation

## 2019-03-16 DIAGNOSIS — I5020 Unspecified systolic (congestive) heart failure: Secondary | ICD-10-CM | POA: Diagnosis not present

## 2019-03-16 DIAGNOSIS — I4510 Unspecified right bundle-branch block: Secondary | ICD-10-CM | POA: Diagnosis not present

## 2019-03-16 DIAGNOSIS — Z8249 Family history of ischemic heart disease and other diseases of the circulatory system: Secondary | ICD-10-CM | POA: Insufficient documentation

## 2019-03-16 DIAGNOSIS — Z6830 Body mass index (BMI) 30.0-30.9, adult: Secondary | ICD-10-CM | POA: Insufficient documentation

## 2019-03-16 DIAGNOSIS — G4700 Insomnia, unspecified: Secondary | ICD-10-CM | POA: Insufficient documentation

## 2019-03-16 DIAGNOSIS — Z79899 Other long term (current) drug therapy: Secondary | ICD-10-CM | POA: Insufficient documentation

## 2019-03-16 DIAGNOSIS — Z452 Encounter for adjustment and management of vascular access device: Secondary | ICD-10-CM | POA: Diagnosis not present

## 2019-03-16 DIAGNOSIS — Z8669 Personal history of other diseases of the nervous system and sense organs: Secondary | ICD-10-CM | POA: Insufficient documentation

## 2019-03-16 DIAGNOSIS — I110 Hypertensive heart disease with heart failure: Secondary | ICD-10-CM | POA: Insufficient documentation

## 2019-03-16 DIAGNOSIS — Z7901 Long term (current) use of anticoagulants: Secondary | ICD-10-CM | POA: Diagnosis not present

## 2019-03-16 DIAGNOSIS — M1990 Unspecified osteoarthritis, unspecified site: Secondary | ICD-10-CM | POA: Insufficient documentation

## 2019-03-16 DIAGNOSIS — Z1159 Encounter for screening for other viral diseases: Secondary | ICD-10-CM | POA: Diagnosis not present

## 2019-03-16 HISTORY — PX: LEFT HEART CATH AND CORONARY ANGIOGRAPHY: CATH118249

## 2019-03-16 LAB — CBC
HCT: 43.7 % (ref 39.0–52.0)
Hemoglobin: 14.2 g/dL (ref 13.0–17.0)
MCH: 31 pg (ref 26.0–34.0)
MCHC: 32.5 g/dL (ref 30.0–36.0)
MCV: 95.4 fL (ref 80.0–100.0)
Platelets: 135 10*3/uL — ABNORMAL LOW (ref 150–400)
RBC: 4.58 MIL/uL (ref 4.22–5.81)
RDW: 18.6 % — ABNORMAL HIGH (ref 11.5–15.5)
WBC: 5 10*3/uL (ref 4.0–10.5)
nRBC: 0 % (ref 0.0–0.2)

## 2019-03-16 LAB — HEMOGLOBIN A1C
Hgb A1c MFr Bld: 5.2 % (ref 4.8–5.6)
Mean Plasma Glucose: 102.54 mg/dL

## 2019-03-16 LAB — ABO/RH: ABO/RH(D): O POS

## 2019-03-16 LAB — URINALYSIS, ROUTINE W REFLEX MICROSCOPIC
Bilirubin Urine: NEGATIVE
Glucose, UA: NEGATIVE mg/dL
Hgb urine dipstick: NEGATIVE
Ketones, ur: NEGATIVE mg/dL
Leukocytes,Ua: NEGATIVE
Nitrite: NEGATIVE
Protein, ur: NEGATIVE mg/dL
Specific Gravity, Urine: 1.034 — ABNORMAL HIGH (ref 1.005–1.030)
pH: 6 (ref 5.0–8.0)

## 2019-03-16 LAB — BLOOD GAS, ARTERIAL
Acid-Base Excess: 2.9 mmol/L — ABNORMAL HIGH (ref 0.0–2.0)
Bicarbonate: 26.6 mmol/L (ref 20.0–28.0)
Drawn by: 421801
FIO2: 21
O2 Saturation: 98.2 %
Patient temperature: 98.6
pCO2 arterial: 38.4 mmHg (ref 32.0–48.0)
pH, Arterial: 7.455 — ABNORMAL HIGH (ref 7.350–7.450)
pO2, Arterial: 111 mmHg — ABNORMAL HIGH (ref 83.0–108.0)

## 2019-03-16 LAB — COMPREHENSIVE METABOLIC PANEL
ALT: 32 U/L (ref 0–44)
AST: 26 U/L (ref 15–41)
Albumin: 3.5 g/dL (ref 3.5–5.0)
Alkaline Phosphatase: 33 U/L — ABNORMAL LOW (ref 38–126)
Anion gap: 9 (ref 5–15)
BUN: 29 mg/dL — ABNORMAL HIGH (ref 8–23)
CO2: 23 mmol/L (ref 22–32)
Calcium: 9.2 mg/dL (ref 8.9–10.3)
Chloride: 106 mmol/L (ref 98–111)
Creatinine, Ser: 1.03 mg/dL (ref 0.61–1.24)
GFR calc Af Amer: >60 mL/min (ref >60)
GFR calc non Af Amer: >60 mL/min (ref >60)
Glucose, Bld: 120 mg/dL — ABNORMAL HIGH (ref 70–99)
Potassium: 4.9 mmol/L (ref 3.5–5.1)
Sodium: 138 mmol/L (ref 135–145)
Total Bilirubin: 1.3 mg/dL — ABNORMAL HIGH (ref 0.3–1.2)
Total Protein: 5.6 g/dL — ABNORMAL LOW (ref 6.5–8.1)

## 2019-03-16 LAB — SURGICAL PCR SCREEN
MRSA, PCR: NEGATIVE
Staphylococcus aureus: NEGATIVE

## 2019-03-16 LAB — PROTIME-INR
INR: 1.1 (ref 0.8–1.2)
Prothrombin Time: 14.2 seconds (ref 11.4–15.2)

## 2019-03-16 LAB — TYPE AND SCREEN
ABO/RH(D): O POS
Antibody Screen: NEGATIVE

## 2019-03-16 LAB — APTT: aPTT: 28 seconds (ref 24–36)

## 2019-03-16 SURGERY — LEFT HEART CATH AND CORONARY ANGIOGRAPHY
Anesthesia: LOCAL

## 2019-03-16 MED ORDER — LIDOCAINE HCL (PF) 1 % IJ SOLN
INTRAMUSCULAR | Status: AC
  Filled 2019-03-16: qty 30

## 2019-03-16 MED ORDER — IOHEXOL 350 MG/ML SOLN
INTRAVENOUS | Status: DC | PRN
  Administered 2019-03-16: 55 mL via INTRAVENOUS

## 2019-03-16 MED ORDER — ASPIRIN 81 MG PO CHEW
CHEWABLE_TABLET | ORAL | Status: AC
  Administered 2019-03-16: 81 mg via ORAL
  Filled 2019-03-16: qty 1

## 2019-03-16 MED ORDER — HEPARIN (PORCINE) IN NACL 1000-0.9 UT/500ML-% IV SOLN
INTRAVENOUS | Status: DC | PRN
  Administered 2019-03-16 (×2): 500 mL

## 2019-03-16 MED ORDER — SODIUM CHLORIDE 0.9 % WEIGHT BASED INFUSION: 1.0000 mL/kg/h | INTRAVENOUS | Status: DC

## 2019-03-16 MED ORDER — HYDRALAZINE HCL 20 MG/ML IJ SOLN: 10.0000 mg | INTRAMUSCULAR | Status: DC | PRN

## 2019-03-16 MED ORDER — LABETALOL HCL 5 MG/ML IV SOLN: 10.0000 mg | INTRAVENOUS | Status: DC | PRN

## 2019-03-16 MED ORDER — SODIUM CHLORIDE 0.9% FLUSH: 3.0000 mL | INTRAVENOUS | Status: DC | PRN

## 2019-03-16 MED ORDER — VERAPAMIL HCL 2.5 MG/ML IV SOLN
INTRAVENOUS | Status: AC
  Filled 2019-03-16: qty 2

## 2019-03-16 MED ORDER — FENTANYL CITRATE (PF) 100 MCG/2ML IJ SOLN
INTRAMUSCULAR | Status: AC
  Filled 2019-03-16: qty 2

## 2019-03-16 MED ORDER — HEPARIN (PORCINE) IN NACL 1000-0.9 UT/500ML-% IV SOLN
INTRAVENOUS | Status: AC
  Filled 2019-03-16: qty 1000

## 2019-03-16 MED ORDER — HEPARIN SODIUM (PORCINE) 1000 UNIT/ML IJ SOLN
INTRAMUSCULAR | Status: AC
  Filled 2019-03-16: qty 1

## 2019-03-16 MED ORDER — LIDOCAINE HCL (PF) 1 % IJ SOLN
INTRAMUSCULAR | Status: DC | PRN
  Administered 2019-03-16: 2 mL
  Administered 2019-03-16: 5 mL

## 2019-03-16 MED ORDER — SODIUM CHLORIDE 0.9 % IV SOLN: 250.0000 mL | INTRAVENOUS | Status: DC | PRN

## 2019-03-16 MED ORDER — SODIUM CHLORIDE 0.9% FLUSH: 3.0000 mL | Freq: Two times a day (BID) | INTRAVENOUS | Status: DC

## 2019-03-16 MED ORDER — MIDAZOLAM HCL 2 MG/2ML IJ SOLN
INTRAMUSCULAR | Status: AC
  Filled 2019-03-16: qty 2

## 2019-03-16 MED ORDER — ACETAMINOPHEN 325 MG PO TABS: 650.0000 mg | ORAL_TABLET | ORAL | Status: DC | PRN

## 2019-03-16 MED ORDER — SODIUM CHLORIDE 0.9 % WEIGHT BASED INFUSION
3.0000 mL/kg/h | INTRAVENOUS | Status: AC
  Administered 2019-03-16: 3 mL/kg/h via INTRAVENOUS

## 2019-03-16 MED ORDER — VERAPAMIL HCL 2.5 MG/ML IV SOLN
INTRAVENOUS | Status: DC | PRN
  Administered 2019-03-16: 10 mL via INTRA_ARTERIAL

## 2019-03-16 MED ORDER — ONDANSETRON HCL 4 MG/2ML IJ SOLN: 4.0000 mg | Freq: Four times a day (QID) | INTRAMUSCULAR | Status: DC | PRN

## 2019-03-16 MED ORDER — ASPIRIN 81 MG PO CHEW
81.0000 mg | CHEWABLE_TABLET | ORAL | Status: AC
  Administered 2019-03-16: 81 mg via ORAL

## 2019-03-16 MED ORDER — HEPARIN SODIUM (PORCINE) 1000 UNIT/ML IJ SOLN
INTRAMUSCULAR | Status: DC | PRN
  Administered 2019-03-16: 5000 [IU] via INTRAVENOUS

## 2019-03-16 MED ORDER — MIDAZOLAM HCL 2 MG/2ML IJ SOLN
INTRAMUSCULAR | Status: DC | PRN
  Administered 2019-03-16 (×2): 2 mg via INTRAVENOUS

## 2019-03-16 MED ORDER — FENTANYL CITRATE (PF) 100 MCG/2ML IJ SOLN
INTRAMUSCULAR | Status: DC | PRN
  Administered 2019-03-16 (×2): 25 ug via INTRAVENOUS

## 2019-03-16 SURGICAL SUPPLY — 11 items

## 2019-03-16 NOTE — Interval H&P Note (Signed)
History and Physical Interval Note:  03/16/2019 7:34 AM  [name]  has presented today for surgery, with the diagnosis of mitral valve regurg.  The various methods of treatment have been discussed with the patient and family. After consideration of risks, benefits and other options for treatment, the patient has consented to  Procedure(s): RIGHT/LEFT HEART CATH AND CORONARY ANGIOGRAPHY (N/A) as a surgical intervention.  The patient's history has been reviewed, patient examined, no change in status, stable for surgery.  I have reviewed the patient's chart and labs.  Questions were answered to the patient's satisfaction.     Sherren Mocha

## 2019-03-16 NOTE — Discharge Instructions (Signed)
Radial Site Care ° °This sheet gives you information about how to care for yourself after your procedure. Your health care provider may also give you more specific instructions. If you have problems or questions, contact your health care provider. °What can I expect after the procedure? °After the procedure, it is common to have: °· Bruising and tenderness at the catheter insertion area. °Follow these instructions at home: °Medicines °· Take over-the-counter and prescription medicines only as told by your health care provider. °Insertion site care °· Follow instructions from your health care provider about how to take care of your insertion site. Make sure you: °? Wash your hands with soap and water before you change your bandage (dressing). If soap and water are not available, use hand sanitizer. °? Change your dressing as told by your health care provider. °? Leave stitches (sutures), skin glue, or adhesive strips in place. These skin closures may need to stay in place for 2 weeks or longer. If adhesive strip edges start to loosen and curl up, you may trim the loose edges. Do not remove adhesive strips completely unless your health care provider tells you to do that. °· Check your insertion site every day for signs of infection. Check for: °? Redness, swelling, or pain. °? Fluid or blood. °? Pus or a bad smell. °? Warmth. °· Do not take baths, swim, or use a hot tub until your health care provider approves. °· You may shower 24-48 hours after the procedure, or as directed by your health care provider. °? Remove the dressing and gently wash the site with plain soap and water. °? Pat the area dry with a clean towel. °? Do not rub the site. That could cause bleeding. °· Do not apply powder or lotion to the site. °Activity ° °· For 24 hours after the procedure, or as directed by your health care provider: °? Do not flex or bend the affected arm. °? Do not push or pull heavy objects with the affected arm. °? Do not  drive yourself home from the hospital or clinic. You may drive 24 hours after the procedure unless your health care provider tells you not to. °? Do not operate machinery or power tools. °· Do not lift anything that is heavier than 10 lb (4.5 kg), or the limit that you are told, until your health care provider says that it is safe. °· Ask your health care provider when it is okay to: °? Return to work or school. °? Resume usual physical activities or sports. °? Resume sexual activity. °General instructions °· If the catheter site starts to bleed, raise your arm and put firm pressure on the site. If the bleeding does not stop, get help right away. This is a medical emergency. °· If you went home on the same day as your procedure, a responsible adult should be with you for the first 24 hours after you arrive home. °· Keep all follow-up visits as told by your health care provider. This is important. °Contact a health care provider if: °· You have a fever. °· You have redness, swelling, or yellow drainage around your insertion site. °Get help right away if: °· You have unusual pain at the radial site. °· The catheter insertion area swells very fast. °· The insertion area is bleeding, and the bleeding does not stop when you hold steady pressure on the area. °· Your arm or hand becomes pale, cool, tingly, or numb. °These symptoms may represent a serious problem   that is an emergency. Do not wait to see if the symptoms will go away. Get medical help right away. Call your local emergency services (911 in the [place] Do not drive yourself to the hospital. °Summary °· After the procedure, it is common to have bruising and tenderness at the site. °· Follow instructions from your health care provider about how to take care of your radial site wound. Check the wound every day for signs of infection. °· Do not lift anything that is heavier than 10 lb (4.5 kg), or the limit that you are told, until your health care provider says  that it is safe. °This information is not intended to replace advice given to you by your health care provider. Make sure you discuss any questions you have with your health care provider. °Document Released: 11/21/2010 Document Revised: 11/24/2017 Document Reviewed: 11/24/2017 °Elsevier Interactive Patient Education © 2019 [employer] ° °

## 2019-03-16 NOTE — Progress Notes (Addendum)
PCP - [name] Cardiologist - Dr. Angelene Giovanni  Chest x-ray - today EKG - today Stress Test -  ECHO - 3-20 Cardiac Cath - today  Sleep Study - na CPAP -   Fasting Blood Sugar - na Checks Blood Sugar _____ times a day  Blood Thinner Instructions:Pt. Not to resume eliquis after heart cath per [facility]. Aspirin Instructions:  Anesthesia review: ekg/heart hx.  Explained to pt. 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 Pt. Stated "if I have to get swabbed again that's fine". Notified Ryan @ [facility], stated she would inform DR. Roxy Manns.  Patient denies shortness of breath, fever, cough and chest pain at PAT appointment   Patient verbalized understanding of instructions that were given to them at the PAT appointment. Patient was also instructed that they will need to review over the PAT instructions again at home before surgery.

## 2019-03-17 ENCOUNTER — Other Ambulatory Visit: Payer: Self-pay

## 2019-03-17 NOTE — Progress Notes (Addendum)
Anesthesia Chart Review:  Case:  [id number] Date/Time:  03/21/19 0714   Procedures:      MINIMALLY INVASIVE MITRAL VALVE REPAIR (MVR) (Right Chest) - GLUTARALDEHYDE     MINIMALLY INVASIVE MAZE PROCEDURE (N/A )     TRANSESOPHAGEAL ECHOCARDIOGRAM (TEE) (N/A )   Anesthesia type:  General   Pre-op diagnosis:      MR     AFIB   Location:  [facility] OR ROOM 15 / [facility]   Surgeon:  Rexene Alberts, MD      DISCUSSION: Patient is a 63 year old male scheduled for the above procedures.   History includes never smoker, HTN, MVP with moderate-severe MR, chronic systolic CHF (normal coronaries 03/16/19), afib/flutter (diagnosed ~11/2018, s/p DCCV 01/03/19; recurrent afib on 03/16/19 EKG), HTN, seizure (sinlge seizure ~ 30 years ago).   According to 03/14/19 cardiology note (via telemedicine-video visit), patient was maintaining SR following 01/03/19 DCCV; however, EKG on 03/16/19 showed recurrent afib, rate controlled. It appears the 03/16/19 EKG was done while recovering for cardiac cath. I could not locate documentation specifying whether or not patient was in afib at the time of his cath or if it recurred post-cath. I have sent a staff message to Dr. Roxy Manns regarding rate controlled afib on 03/16/19 EKG. Patient had been instructed to hold Eliquis following his heart cath for upcoming CT surgery. He is scheduled to evaluate Mr. Raimondi on 03/20/19.  Patient had a negative COVID test on 03/15/19, but surgery is not until 03/21/19. 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 [facility] staff were notified.   ADDENDUM 03/20/19 1:50 PM: I attempted to reach patient this afternoon to determine extent of quarantine since 03/15/19. Only got voice message. Reportedly, if no symptoms then Dr. Roxy Manns did not favor retesting. Disucssed with several anesthesiologists includeing Oren Bracket, MD. If patient confirms that he was unable to home  quarantine (if he delivered cars as anticipated, even if mask worn) then anesthesiologist would like for patient to get retested. I have entered order and will send staff message to Dr. Roxy Manns. If unable to reach patient later this afternoon, then holding Room RN staff can confirm patient's quarantine status and perform testing if patient unable to home quarantine.     VS: Post-cath vitals on 03/16/19 included a BP of 102/81, HR 74, O2 sat 99%.    PROVIDERS: [name], MD is PCP  Quay Burow, MD is primary cardiologist   LABS: Labs reviewed: Acceptable for surgery. (all labs ordered are listed, but only abnormal results are displayed)  Labs Reviewed  BLOOD GAS, ARTERIAL - Abnormal; Notable for the following components:      Result Value   pH, Arterial 7.455 (*)    pO2, Arterial 111 (*)    Acid-Base Excess 2.9 (*)    All other components within normal limits  COMPREHENSIVE METABOLIC PANEL - Abnormal; Notable for the following components:   Glucose, Bld 120 (*)    BUN 29 (*)    Total Protein 5.6 (*)    Alkaline Phosphatase 33 (*)    Total Bilirubin 1.3 (*)    All other components within normal limits  URINALYSIS, ROUTINE W REFLEX MICROSCOPIC - Abnormal; Notable for the following components:   Specific Gravity, Urine 1.034 (*)    All other components within normal limits  SURGICAL PCR SCREEN  APTT  HEMOGLOBIN A1C  PROTIME-INR  TYPE  AND SCREEN  ABO/RH    IMAGES: CXR 03/16/19: IMPRESSION: No acute cardiopulmonary process.  CTA chest/abd/pelvis 01/03/19: IMPRESSION: 1. No acute abnormality. Mildly tortuous nonaneurysmal thoracic aorta.   Mild-to-moderate atherosclerosis in the aorta, iliac and femoral arteries.  Mild-to-moderate tortuosity of the right common iliac artery. 2. Moderate cardiomegaly. 3. Dilated main pulmonary artery, suggesting pulmonary arterial hypertension. 4. Small dependent right pleural effusion. 5. Nonspecific mild diffuse gallbladder wall  thickening without radiopaque cholelithiasis, more likely due to noninflammatory edema unless the patient has right upper quadrant abdominal symptoms. 6. Marked diffuse colonic diverticulosis. 7.  Aortic Atherosclerosis (ICD10-I70.0).   EKG: 03/16/19: Atrial fibrillation at 68 BPM Left axis deviation Right bundle branch block T wave abnormality, consider lateral ischemia Abnormal ECG Confirmed by Croitoru, Mihai [phone]) on 03/16/2019 10:19:34 PM (Previous EKGs on 01/03/19 [post-DCCV] and 01/11/19 showed SR/SB, but had been in afib/flutter on previous EKGs since 12/02/18.)    CV: Cardiac cath 03/16/19: 1.  Widely patent, angiographically normal coronary arteries 2.  Normal LVEDP Recommend: Continued preoperative evaluation for mitral valve repair next week  TEE 01/03/19: IMPRESSIONS  1. The left ventricle has moderately reduced systolic function, with an ejection fraction of 35-40%.  2. The right ventricle has moderately reduced systolic function.  3. Left atrial size was severely dilated.  4. Normal LA appendage with no evidence of thrombus.  5. Right atrial size was severely dilated.  6. There is severe myoxmatous degeneration of the posterior mitral valve leafelt with severe prolapse of the P2 component of the posterior leaflet. There appears to be a ruptured chordae tendinae with partial flail leaflet. There is moderate to severe  eccentric mitral regurgitation directed towards the interatrial septum and wraps around the LA to the posterior wall and mouth of the pulmonary vein but no flow reversal in the pulmonary vein by doppler.  7. The tricuspid valve was normal in structure with trivial TR.  8. The aortic valve is tricuspid.  9. The pulmonic valve was normal in structure with trivial PR. 10. The aortic root and ascending aorta are normal in size and structure. 11. Normal interatrial septum with no evidence of shunt by colorflow doppler.  Carotid US 03/16/19: Summary: Right  Carotid: Velocities in the right ICA are consistent with a 1-39% stenosis. Left Carotid: Velocities in the left ICA are consistent with a 1-39% stenosis. Vertebrals: Bilateral vertebral arteries demonstrate antegrade flow.   Past Medical History:  Diagnosis Date  . Acute on chronic systolic (congestive) heart failure ([place]) 12/30/2018  . Allergy   . Arthritis   . Atrial flutter with rapid ventricular response ([place])   . Chronic systolic (congestive) heart failure ([place])   . Dilated aortic root ([place]) 01/04/2019  . Dilated cardiomyopathy ([place]) 01/04/2019  . GERD (gastroesophageal reflux disease)    history of  . Heart murmur   . History of colon polyps   . Hypertension   . Insomnia   . Mitral valve prolapse   . Mitral valve regurgitation   . Persistent atrial fibrillation 12/02/2018  . Seizures ([place])    had one approx. 30 yrs ago,has not had any since  . Severe mitral regurgitation     Past Surgical History:  Procedure Laterality Date  . ANKLE FUSION  left   Dec. 2012  . ARTHROSCOPY KNEE W/ DRILLING  bilateral   2012  . CARDIOVERSION N/A 01/03/2019   Procedure: CARDIOVERSION;  Surgeon: Sueanne Margarita, MD;  Location: [facility] ENDOSCOPY;  Service: Cardiovascular;  Laterality: N/A;  . COLONOSCOPY  x2  . FOOT ARTHRODESIS  06/23/2012   Procedure: ARTHRODESIS FOOT;  Surgeon: Wylene Simmer, MD;  Location: [facility];  Service: Orthopedics;  Laterality: Left;  Left Subtalar and Talonavicular Joint Revision Arthrodesis  Aspiration of Bone Marrow from Left Hip   . HAIR TRANSPLANT    . HARDWARE REMOVAL  06/23/2012   Procedure: HARDWARE REMOVAL;  Surgeon: Wylene Simmer, MD;  Location: [facility];  Service: Orthopedics;  Laterality: Left;  Removal of Deep Implant  X's 3  . INSERTION OF MESH N/A 07/10/2016   Procedure: INSERTION OF MESH;  Surgeon: Coralie Keens, MD;  Location: [facility];  Service: General;  Laterality: N/A;  . JOINT REPLACEMENT     right hip  01-2011  . LEFT HEART CATH AND  CORONARY ANGIOGRAPHY N/A 03/16/2019   Procedure: LEFT HEART CATH AND CORONARY ANGIOGRAPHY;  Surgeon: Sherren Mocha, MD;  Location: [facility] CV LAB;  Service: Cardiovascular;  Laterality: N/A;  . LIMB SPARING RESECTION HIP W/ SADDLE JOINT REPLACEMENT Right   . TEE WITHOUT CARDIOVERSION N/A 01/03/2019   Procedure: TRANSESOPHAGEAL ECHOCARDIOGRAM (TEE);  Surgeon: Sueanne Margarita, MD;  Location: [facility] ENDOSCOPY;  Service: Cardiovascular;  Laterality: N/A;  . TOTAL KNEE ARTHROPLASTY Right 01/19/2014   Procedure: RIGHT TOTAL KNEE ARTHROPLASTY, Steroid injection left knee;  Surgeon: Mcarthur Rossetti, MD;  Location: [facility];  Service: Orthopedics;  Laterality: Right;  . TOTAL KNEE ARTHROPLASTY Left 10/23/2014   Procedure: LEFT TOTAL KNEE ARTHROPLASTY;  Surgeon: Mcarthur Rossetti, MD;  Location: [facility]S;  Service: Orthopedics;  Laterality: Left;  . UMBILICAL HERNIA REPAIR N/A 07/10/2016   Procedure: UMBILICAL HERNIA REPAIR;  Surgeon: Coralie Keens, MD;  Location: [facility];  Service: General;  Laterality: N/A;    MEDICATIONS: . ALPRAZolam (XANAX) 0.5 MG tablet  . amiodarone (PACERONE) 200 MG tablet  . diltiazem 2 % GEL  . diphenhydrAMINE (SOMINEX) 25 MG tablet  . docusate sodium (COLACE) 100 MG capsule  . HYDROcodone-acetaminophen (NORCO) 10-325 MG tablet  . KLOR-CON M10 10 MEQ tablet  . metoprolol tartrate (LOPRESSOR) 100 MG tablet  . Multiple Vitamin (MULTIVITAMIN WITH MINERALS) TABS tablet  . sacubitril-valsartan (ENTRESTO) 24-26 MG  . spironolactone (ALDACTONE) 25 MG tablet  . torsemide (DEMADEX) 20 MG tablet   No current facility-administered medications for this encounter.     Myra Gianotti, PA-C Surgical Short Stay/Anesthesiology [facility] Phone [phone] [facility] Phone [phone] 03/17/2019 2:23 PM

## 2019-03-17 NOTE — Anesthesia Preprocedure Evaluation (Addendum)
Anesthesia Evaluation  Patient identified by MRN, date of birth, ID band Patient awake    Reviewed: Allergy & Precautions, NPO status , Patient's Chart, lab work & pertinent test results  Airway Mallampati: III  TM Distance: >3 FB Neck ROM: Full    Dental  (+) Teeth Intact, Dental Advisory Given   Pulmonary    breath sounds clear to auscultation       Cardiovascular hypertension,  Rhythm:Irregular Rate:Normal     Neuro/Psych    GI/Hepatic   Endo/Other    Renal/GU      Musculoskeletal   Abdominal (+) + obese,   Peds  Hematology   Anesthesia Other Findings   Reproductive/Obstetrics                            Anesthesia Physical Anesthesia Plan  ASA: III  Anesthesia Plan:    Post-op Pain Management:    Induction: Intravenous  PONV Risk Score and Plan: Ondansetron and Dexamethasone  Airway Management Planned: Double Lumen EBT  Additional Equipment:   Intra-op Plan:   Post-operative Plan: Extubation in OR  Informed Consent: I have reviewed the patients History and Physical, chart, labs and discussed the procedure including the risks, benefits and alternatives for the proposed anesthesia with the patient or authorized representative who has indicated his/her understanding and acceptance.     Dental advisory given  Plan Discussed with: CRNA and Anesthesiologist  Anesthesia Plan Comments: (PAT note written 03/17/2019 by Myra Gianotti, PA-C. See notation regarding COVID testing.  )       Anesthesia Quick Evaluation

## 2019-03-20 ENCOUNTER — Other Ambulatory Visit (HOSPITAL_COMMUNITY): Payer: BLUE CROSS/BLUE SHIELD

## 2019-03-20 ENCOUNTER — Encounter: Payer: Self-pay | Admitting: Thoracic Surgery (Cardiothoracic Vascular Surgery)

## 2019-03-20 ENCOUNTER — Other Ambulatory Visit: Payer: Self-pay

## 2019-03-20 ENCOUNTER — Ambulatory Visit: Payer: BLUE CROSS/BLUE SHIELD | Admitting: Thoracic Surgery (Cardiothoracic Vascular Surgery)

## 2019-03-20 VITALS — BP 113/82 | HR 73 | Temp 98.9°F | Resp 16 | Ht 75.0 in | Wt 241.0 lb

## 2019-03-20 DIAGNOSIS — I4892 Unspecified atrial flutter: Secondary | ICD-10-CM | POA: Diagnosis not present

## 2019-03-20 DIAGNOSIS — I340 Nonrheumatic mitral (valve) insufficiency: Secondary | ICD-10-CM

## 2019-03-20 DIAGNOSIS — I4819 Other persistent atrial fibrillation: Secondary | ICD-10-CM

## 2019-03-20 DIAGNOSIS — I341 Nonrheumatic mitral (valve) prolapse: Secondary | ICD-10-CM | POA: Diagnosis not present

## 2019-03-20 MED ORDER — VANCOMYCIN HCL 10 G IV SOLR
1500.0000 mg | INTRAVENOUS | Status: AC
  Administered 2019-03-21: 2500 mg via INTRAVENOUS
  Filled 2019-03-20: qty 1500

## 2019-03-20 MED ORDER — SODIUM CHLORIDE 0.9 % IV SOLN
INTRAVENOUS | Status: DC
  Filled 2019-03-20: qty 30

## 2019-03-20 MED ORDER — DOPAMINE-DEXTROSE 3.2-5 MG/ML-% IV SOLN
0.0000 ug/kg/min | INTRAVENOUS | Status: DC
  Filled 2019-03-20: qty 250

## 2019-03-20 MED ORDER — INSULIN REGULAR(HUMAN) IN NACL 100-0.9 UT/100ML-% IV SOLN
INTRAVENOUS | Status: AC
  Administered 2019-03-21: 08:00:00 1 [IU]/h via INTRAVENOUS
  Filled 2019-03-20: qty 100

## 2019-03-20 MED ORDER — KENNESTONE BLOOD CARDIOPLEGIA (KBC) MANNITOL SYRINGE (20%, 32ML)
32.0000 mL | INTRAVENOUS | Status: DC
  Filled 2019-03-20 (×2): qty 32

## 2019-03-20 MED ORDER — KENNESTONE BLOOD CARDIOPLEGIA VIAL
13.0000 mL | Status: DC
  Filled 2019-03-20 (×2): qty 13

## 2019-03-20 MED ORDER — DEXMEDETOMIDINE HCL IN NACL 400 MCG/100ML IV SOLN
0.1000 ug/kg/h | INTRAVENOUS | Status: AC
  Administered 2019-03-21: 0.7 ug/kg/h via INTRAVENOUS
  Filled 2019-03-20: qty 100

## 2019-03-20 MED ORDER — PHENYLEPHRINE HCL-NACL 20-0.9 MG/250ML-% IV SOLN
30.0000 ug/min | INTRAVENOUS | Status: DC
  Filled 2019-03-20: qty 250

## 2019-03-20 MED ORDER — PLASMA-LYTE 148 IV SOLN
INTRAVENOUS | Status: AC
  Administered 2019-03-21: 500 mL
  Filled 2019-03-20: qty 2.5

## 2019-03-20 MED ORDER — VANCOMYCIN HCL 1000 MG IV SOLR
INTRAVENOUS | Status: AC
  Administered 2019-03-21: 10:00:00
  Filled 2019-03-20: qty 1000

## 2019-03-20 MED ORDER — MAGNESIUM SULFATE 50 % IJ SOLN
40.0000 meq | INTRAMUSCULAR | Status: DC
  Filled 2019-03-20: qty 9.85

## 2019-03-20 MED ORDER — EPINEPHRINE PF 1 MG/ML IJ SOLN
0.0000 ug/min | INTRAVENOUS | Status: DC
  Filled 2019-03-20: qty 4

## 2019-03-20 MED ORDER — TRANEXAMIC ACID 1000 MG/10ML IV SOLN
1.5000 mg/kg/h | INTRAVENOUS | Status: AC
  Administered 2019-03-21: 1.5 mg/kg/h via INTRAVENOUS
  Filled 2019-03-20: qty 25

## 2019-03-20 MED ORDER — NOREPINEPHRINE 4 MG/250ML-% IV SOLN
0.0000 ug/min | INTRAVENOUS | Status: DC
  Filled 2019-03-20: qty 250

## 2019-03-20 MED ORDER — NITROGLYCERIN IN D5W 200-5 MCG/ML-% IV SOLN
2.0000 ug/min | INTRAVENOUS | Status: DC
  Filled 2019-03-20: qty 250

## 2019-03-20 MED ORDER — SODIUM CHLORIDE 0.9 % IV SOLN
750.0000 mg | INTRAVENOUS | Status: DC
  Filled 2019-03-20: qty 750

## 2019-03-20 MED ORDER — TRANEXAMIC ACID (OHS) BOLUS VIA INFUSION
15.0000 mg/kg | INTRAVENOUS | Status: AC
  Administered 2019-03-21: 1639.5 mg via INTRAVENOUS
  Filled 2019-03-20: qty 1640

## 2019-03-20 MED ORDER — MILRINONE LACTATE IN DEXTROSE 20-5 MG/100ML-% IV SOLN
0.3000 ug/kg/min | INTRAVENOUS | Status: AC
  Administered 2019-03-21: 13:00:00 0.25 ug/kg/min via INTRAVENOUS
  Filled 2019-03-20: qty 100

## 2019-03-20 MED ORDER — SODIUM CHLORIDE 0.9 % IV SOLN
1.5000 g | INTRAVENOUS | Status: AC
  Administered 2019-03-21: 1.5 g via INTRAVENOUS
  Filled 2019-03-20: qty 1.5

## 2019-03-20 MED ORDER — TRANEXAMIC ACID (OHS) PUMP PRIME SOLUTION
2.0000 mg/kg | INTRAVENOUS | Status: DC
  Filled 2019-03-20: qty 2.19

## 2019-03-20 MED ORDER — POTASSIUM CHLORIDE 2 MEQ/ML IV SOLN
80.0000 meq | INTRAVENOUS | Status: DC
  Filled 2019-03-20: qty 40

## 2019-03-20 NOTE — H&P (Signed)
[address]       [place] [address]             [phone]          CARDIOTHORACIC SURGERY HISTORY AND PHYSICAL EXAM  Referring Provider is Nahser, Wonda Cheng, MD Primary Cardiologist is Quay Burow, MD PCP is [name], MD   HPI:  Patient is a 63 year old male with known history of mitral valve prolapse and mitral regurgitation, hypertension, and degenerative arthritis who was admitted to the hospital in rapid atrial fibrillation and atrial flutter with decompensated acute on chronic combined systolic and diastolic congestive heart failure and has been referred for surgical consultation to discuss treatment options for management of mitral regurgitation and atrial fibrillation.  Patient states that he was first noted to have a heart murmur on physical exam by his primary care physician more than 10 years ago.  He was diagnosed with mitral valve prolapse and has been followed intermittently by Dr. Gwenlyn Found ever since.  Echocardiogram performed June 2019 revealed severe mitral valve prolapse with moderate mitral regurgitation.  Left ventricular systolic function was normal with ejection fraction estimated 60 to 65%.  Several months ago the patient began to experience increased fatigue, exertional shortness of breath, anorexia, and progressive weight gain with worsening lower extremity edema.  He was evaluated by his primary care physician and notably tachycardic and fluid overloaded.  He was started on oral diuretics and referred to Dr. Alvester Chou who saw him December 02, 2018.  At that time he was in rapid atrial fibrillation with heart rate 130.  He was started on Eliquis and metoprolol dose increased.  A follow-up echocardiogram was performed December 09, 2018 which revealed moderate to severe left ventricular systolic dysfunction with ejection fraction estimated only 35 to 40%.  There was felt to be moderate mitral regurgitation although question of flail segment of  the posterior leaflet.  He returned to see Dr. Gwenlyn Found in follow-up and had gained another 20 pounds in weight and appeared short of breath in acutely decompensated congestive heart failure.  He was sent directly to the hospital for admission where he was found to be in atrial flutter.  He responded well to intravenous diuresis and underwent TEE with DC cardioversion.  TEE confirmed the presence of mitral valve prolapse with ruptured chordae tendinae and a flail segment of the posterior leaflet.  There is severe mitral regurgitation.  Left ventricular ejection fraction was estimated 30 to 35%.  There is moderately reduced right ventricular function.  Severely dilated left and right atrium.  No left atrial thrombus.  The aortic valve was trileaflet and otherwise normal.  The patient underwent cardioversion back into sinus rhythm.  Cardiothoracic surgical consultation was requested.  I had the opportunity to see him in consultation on January 03, 2019. I last spoke with him over the telephone on Mar 06, 2019.  Since then the patient underwent diagnostic cardiac catheterization and he returns to the office today with tentative plans to proceed with elective minimally invasive mitral valve repair and Maze procedure in the operating room tomorrow.  He describes stable symptoms of exertional shortness of breath.  He denies resting shortness of breath, PND, orthopnea, palpitations, dizzy spells, or syncope.  Routine preoperative EKG revealed the patient is currently in rate controlled persistent atrial fibrillation.  He was unaware that he had gone back out of rhythm.  He has been practicing social distancing recommendations per guidelines and he denies any recent fevers,  chills, worsening shortness of breath, or cough.  He is eager to proceed with surgery as previously planned.  Patient is married and lives locally in [place] with his wife.  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  He does  not exercise on a regular basis.  He has had problems with degenerative arthritis which ultimately led to right hip replacement and bilateral total knee replacements.  However, he recovered from those procedures quite well and reports no physical limitations recently.  Several months ago he had problems with pain in his right leg which ultimately was attributed to a cyst in his spine that was successfully injected.  He reports that immediately prior to hospital admission he was short of breath with low-level activity and occasionally at rest.  He had severe lower extremity edema.  All of this has improved considerably over the last few days.  He has never had any chest pain or chest tightness.  He has not had any dizzy spells or syncope.  He has recently had a dry nonproductive cough.    Past Medical History:  Diagnosis Date   Acute on chronic systolic (congestive) heart failure (HCC) 12/30/2018   Allergy    Arthritis    Atrial flutter with rapid ventricular response (HCC)    Chronic systolic (congestive) heart failure (HCC)    Dilated aortic root ([facility]) 01/04/2019   Dilated cardiomyopathy ([facility]) 01/04/2019   GERD (gastroesophageal reflux disease)    history of   Heart murmur    History of colon polyps    Hypertension    Insomnia    Mitral valve prolapse    Mitral valve regurgitation    Persistent atrial fibrillation 12/02/2018   Seizures ([facility])    had one approx. 30 yrs ago,has not had any since   Severe mitral regurgitation     Past Surgical History:  Procedure Laterality Date   ANKLE FUSION  left   Dec. 2012   ARTHROSCOPY KNEE W/ DRILLING  bilateral   2012   CARDIOVERSION N/A 01/03/2019   Procedure: CARDIOVERSION;  Surgeon: Sueanne Margarita, MD;  Location: [facility];  Service: Cardiovascular;  Laterality: N/A;   COLONOSCOPY     x2   FOOT ARTHRODESIS  06/23/2012   Procedure: ARTHRODESIS FOOT;  Surgeon: Wylene Simmer, MD;  Location: [facility];  Service: Orthopedics;   Laterality: Left;  Left Subtalar and Talonavicular Joint Revision Arthrodesis  Aspiration of Bone Marrow from Left Hip    HAIR TRANSPLANT     HARDWARE REMOVAL  06/23/2012   Procedure: HARDWARE REMOVAL;  Surgeon: Wylene Simmer, MD;  Location: [facility];  Service: Orthopedics;  Laterality: Left;  Removal of Deep Implant  X's 3   INSERTION OF MESH N/A 07/10/2016   Procedure: INSERTION OF MESH;  Surgeon: Coralie Keens, MD;  Location: [facility];  Service: General;  Laterality: N/A;   JOINT REPLACEMENT     right hip  01-2011   LEFT HEART CATH AND CORONARY ANGIOGRAPHY N/A 03/16/2019   Procedure: LEFT HEART CATH AND CORONARY ANGIOGRAPHY;  Surgeon: Sherren Mocha, MD;  Location: [facility];  Service: Cardiovascular;  Laterality: N/A;   LIMB SPARING RESECTION HIP W/ SADDLE JOINT REPLACEMENT Right    TEE WITHOUT CARDIOVERSION N/A 01/03/2019   Procedure: TRANSESOPHAGEAL ECHOCARDIOGRAM (TEE);  Surgeon: Sueanne Margarita, MD;  [facility];  Service: Cardiovascular;  Laterality: N/A;   TOTAL KNEE ARTHROPLASTY Right 01/19/2014   Procedure: RIGHT TOTAL KNEE ARTHROPLASTY, Steroid injection left  knee;  Surgeon: Mcarthur Rossetti, MD;  Location: [facility];  Service: Orthopedics;  Laterality: Right;   TOTAL KNEE ARTHROPLASTY Left 10/23/2014   Procedure: LEFT TOTAL KNEE ARTHROPLASTY;  Surgeon: Mcarthur Rossetti, MD;  Location: [facility];  Service: Orthopedics;  Laterality: Left;   UMBILICAL HERNIA REPAIR N/A 07/10/2016   Procedure: UMBILICAL HERNIA REPAIR;  Surgeon: Coralie Keens, MD;  Location: [facility];  Service: General;  Laterality: N/A;    Family History  Problem Relation Age of Onset   Hypertension Father    Colon cancer Father        dx in his late 51's   Diabetes Mother    Stomach cancer Neg Hx    Esophageal cancer Neg Hx    Pancreatic cancer Neg Hx    Rectal cancer Neg Hx     Social History Social History   Tobacco Use   Smoking  status: Never Smoker   Smokeless tobacco: Never Used  Substance Use Topics   Alcohol use: Yes    Comment: 1 or 2 drink daily   Drug use: No    Prior to Admission medications   Medication Sig Start Date End Date Taking? Authorizing Provider  ALPRAZolam Duanne Moron) 0.5 MG tablet Take 0.5 mg by mouth at bedtime.    Yes [provider]  amiodarone (PACERONE) 200 MG tablet Take 1 tablet (200 mg total) by mouth daily. 01/11/19  Yes Kilroy, Luke K, PA-C  diltiazem 2 % GEL Apply 1 application topically 3 (three) times daily. With 5 % lidocaine Patient taking differently: Apply 1 application topically 3 (three) times daily as needed (rectal discomfort). With 5 % lidocaine 03/02/19  Yes Ladene Artist, MD  diphenhydrAMINE (SOMINEX) 25 MG tablet Take 25 mg by mouth at bedtime.   Yes [provider]  docusate sodium (COLACE) 100 MG capsule Take 300 mg by mouth daily.   Yes [provider]  HYDROcodone-acetaminophen (NORCO) 10-325 MG tablet Take 1 tablet by mouth every 8 (eight) hours as needed. 02/08/19  Yes [provider]  KLOR-CON M10 10 MEQ tablet Take 10 mEq by mouth 2 (two) times daily. 02/23/19  Yes [provider]  metoprolol tartrate (LOPRESSOR) 100 MG tablet Take 50 mg by mouth 2 (two) times daily. 02/23/19  Yes [provider]  Multiple Vitamin (MULTIVITAMIN WITH MINERALS) TABS tablet Take 1 tablet by mouth daily.   Yes [provider]  sacubitril-valsartan (ENTRESTO) 24-26 MG Take 1 tablet by mouth 2 (two) times daily. 01/04/19  Yes Isaiah Serge, NP  spironolactone (ALDACTONE) 25 MG tablet Take 1 tablet (25 mg total) by mouth daily. 01/05/19  Yes Isaiah Serge, NP  torsemide (DEMADEX) 20 MG tablet Take 1 tablet (20 mg total) by mouth daily. 01/05/19  Yes Isaiah Serge, NP    No Known Allergies   Review of Systems:              General:                      normal appetite, decreased energy, + weight gain, no weight loss, no  fever             Cardiac:                       no chest pain with exertion, no chest pain at rest, +SOB with exertion, + resting SOB, no PND, no orthopnea, + palpitations, + arrhythmia, +  atrial fibrillation, + LE edema, no dizzy spells, no syncope             Respiratory:                 + shortness of breath, no home oxygen, no productive cough, + dry cough, no bronchitis, no wheezing, no hemoptysis, no asthma, no pain with inspiration or cough, no sleep apnea, no CPAP at night             GI:                               no difficulty swallowing, no reflux, no frequent heartburn, no hiatal hernia, no abdominal pain, no constipation, no diarrhea, no hematochezia, no hematemesis, no melena             GU:                              no dysuria,  no frequency, no urinary tract infection, no hematuria, no enlarged prostate, no kidney stones, no kidney disease             Vascular:                     no pain suggestive of claudication, no pain in feet, no leg cramps, no varicose veins, no DVT, no non-healing foot ulcer             Neuro:                         no stroke, no TIA's, no seizures, no headaches, no temporary blindness one eye,  no slurred speech, no peripheral neuropathy, no chronic pain, no instability of gait, no memory/cognitive dysfunction             Musculoskeletal:         + arthritis - primarily involving the lower back and knees, no joint swelling, no myalgias, no difficulty walking, normal mobility              Skin:                            no rash, no itching, no skin infections, no pressure sores or ulcerations             Psych:                         no anxiety, no depression, no nervousness, no unusual recent stress             Eyes:                           no blurry vision, no floaters, no recent vision changes, does not wear glasses or contacts             ENT:                            no hearing loss, no loose or painful teeth, no dentures, last saw dentist within  the past year             Hematologic:  no easy bruising, no abnormal bleeding, no clotting disorder, no frequent epistaxis             Endocrine:                   no diabetes, does not check CBG's at home                           Physical Exam:              BP (!) 86/72 (BP Location: Right Arm)    Pulse 82    Temp 98.2 F (36.8 C) (Oral)    Resp 18    Ht 6\' 3"  (1.905 m)    Wt 115.8 kg Comment: scale b   SpO2 99%    BMI 31.90 kg/m              General:                      Mildly obese,  well-appearing             HEENT:                       Unremarkable              Neck:                           no JVD, no bruits, no adenopathy              Chest:                          symmetrical breath sounds, no wheezes, no rhonchi , few crackles lung bases             CV:                              RRR, grade III/VI systolic murmur              Abdomen:                    soft, non-tender, no masses              Extremities:                 warm, well-perfused, pulses diminished but palpable, no lower extremity edema             Rectal/GU                   Deferred             Neuro:                         Grossly non-focal and symmetrical throughout             Skin:                            Clean and dry, no rashes, no breakdown  Diagnostic Tests:  Lab Results: RecentLabs(last2labs)  No results for input(s): WBC, HGB, HCT, PLT in the last 72 hours.   BMET:  RecentLabs(last2labs)      Recent Labs    01/02/19 0434  01/03/19 0429  NA 141 141  K 3.6 4.0  CL 102 100  CO2 31 32  GLUCOSE 118* 112*  BUN 18 22  CREATININE 1.26* 1.18  CALCIUM 8.6* 8.8*      CBG (last 3)  RecentLabs(last2labs)  No results for input(s): GLUCAP in the last 72 hours.   PT/INR:   RecentLabs(last2labs)  No results for input(s): LABPROT, INR in the last 72 hours.    CXR:  CHEST - 2 VIEW  COMPARISON: 01/15/2014  FINDINGS: Mild cardiomegaly. Normal  vascularity. Low lung volumes. Bibasilar atelectasis. Small right pleural effusion. No pneumothorax.  IMPRESSION: Cardiomegaly without decompensation.  Bibasilar atelectasis.  Small right pleural effusion.   Electronically Signed By: Marybelle Killings M.D. On: 12/30/2018 16:40    Echocardiography  Patient: Aseem, Sessums MR #: [id number] Study Date: 04/27/2018 Gender: M Age: 29 Height: 190.5 cm Weight: 118.8 kg BSA: 2.54 m^2 Pt. Status: Room:  ORDERING Quay Burow, MD REFERRING Quay Burow, MD ATTENDING Candee Furbish, M.D. PERFORMING [facility], Outpatient SONOGRAPHER [facility], RDCS  cc:  ------------------------------------------------------------------- LV EF: 60% - 65%  ------------------------------------------------------------------- Indications: Mitral Valve Insufficiency (I34.0).  ------------------------------------------------------------------- History: PMH: Murmur. Risk factors: Family history of coronary artery disease. Hypertension.  ------------------------------------------------------------------- Study Conclusions  - Left ventricle: The cavity size was normal. Wall thickness was increased in a pattern of mild LVH. Systolic function was normal. The estimated ejection fraction was in the range of 60% to 65%. Wall motion was normal; there were no regional wall motion abnormalities. Doppler parameters are consistent with abnormal left ventricular relaxation (grade 1 diastolic dysfunction). - Aorta: Ascending aortic diameter: 41 mm (S). - Ascending aorta: The ascending aorta was mildly dilated. - Mitral valve: Severe prolapse, involving the posterior leaflet. There was moderate regurgitation directed anteriorly. - Left atrium: The atrium was moderately dilated. Volume/bsa, S: 39.4 ml/m^2. - Right ventricle: The cavity size was mildly dilated.  Wall thickness was normal. - Right atrium: The atrium was mildly dilated.  Impressions:  - Compared to the prior study, there has been no significant interval change.  ------------------------------------------------------------------- Study data: Comparison was made to the study of 04/09/2017. Study status: Routine. Procedure: Transthoracic echocardiography. Image quality was adequate. Echocardiography. M-mode, complete 2D, 3D, spectral Doppler, and color Doppler. Birthdate: Patient birthdate: 11/25/1955. Age: Patient is 63 yr old. Sex: Gender: male. BMI: 32.7 kg/m^2. Blood pressure: 130/70 Patient status: Outpatient. Study date: Study date: 04/27/2018. Study time: 11:39 AM. Location: [facility]  -------------------------------------------------------------------  ------------------------------------------------------------------- Left ventricle: The cavity size was normal. Wall thickness was increased in a pattern of mild LVH. Systolic function was normal. The estimated ejection fraction was in the range of 60% to 65%. Wall motion was normal; there were no regional wall motion abnormalities. Doppler parameters are consistent with abnormal left ventricular relaxation (grade 1 diastolic dysfunction).  ------------------------------------------------------------------- Aortic valve: Trileaflet; normal thickness leaflets. Mobility was not restricted. Doppler: Transvalvular velocity was within the normal range. There was no stenosis. There was no regurgitation.  ------------------------------------------------------------------- Aorta: Ascending aorta: The ascending aorta was mildly dilated.  ------------------------------------------------------------------- Mitral valve: Mobility was not restricted. Severe prolapse, involving the posterior leaflet. Doppler: Transvalvular velocity was within the normal range. There was  no evidence for stenosis. There was moderate regurgitation directed anteriorly. Peak gradient (D): 3 mm Hg.  ------------------------------------------------------------------- Left atrium: The atrium was moderately dilated.  ------------------------------------------------------------------- Right ventricle: The cavity size was mildly dilated. Wall thickness was normal. Systolic function was normal.  ------------------------------------------------------------------- Pulmonic valve: Poorly visualized. Structurally normal valve. Cusp separation  was normal. Doppler: Transvalvular velocity was within the normal range. There was no evidence for stenosis. There was no regurgitation.  ------------------------------------------------------------------- Tricuspid valve: Structurally normal valve. Doppler: Transvalvular velocity was within the normal range. There was no regurgitation.  ------------------------------------------------------------------- Pulmonary artery: The main pulmonary artery was normal-sized. Systolic pressure was within the normal range.  ------------------------------------------------------------------- Right atrium: The atrium was mildly dilated.  ------------------------------------------------------------------- Pericardium: There was no pericardial effusion.  ------------------------------------------------------------------- Systemic veins: Inferior vena cava: The vessel was normal in size.  ------------------------------------------------------------------- Measurements  Left ventricle Value Reference LV ID, ED, PLAX chordal (H) 62.1 mm 43 - 52 LV ID, ES, PLAX chordal (H) 38.5 mm 23 - 38 LV fx shortening, PLAX chordal 38 % >=29 LV PW thickness, ED 10.9 mm ---------- IVS/LV PW ratio, ED 1.16  <=1.3 Stroke volume, 2D 135 ml ---------- Stroke volume/bsa, 2D 53 ml/m^2 ---------- LV e&', lateral 12.3 cm/s ---------- LV E/e&', lateral 6.94 ---------- LV e&', medial 8.7 cm/s ---------- LV E/e&', medial 9.82 ---------- LV e&', average 10.5 cm/s ---------- LV E/e&', average 8.13 ----------  Ventricular septum Value Reference IVS thickness, ED 12.6 mm ----------  LVOT Value Reference LVOT ID, S 26 mm ---------- LVOT area 5.31 cm^2 ---------- LVOT peak velocity, S 124 cm/s ---------- LVOT mean velocity, S 83.5 cm/s ---------- LVOT VTI, S 25.5 cm ---------- LVOT peak gradient, S 6 mm Hg ----------  Aorta Value Reference Aortic root ID, ED 38 mm ---------- Ascending aorta ID, A-P, S 41 mm ----------  Left atrium Value Reference LA ID, A-P, ES 50 mm ---------- LA ID/bsa, A-P 1.97 cm/m^2 <=2.2 LA volume, S 100 ml ---------- LA volume/bsa, S 39.4 ml/m^2 ---------- LA volume, ES, 1-p A4C 51.5 ml ---------- LA volume/bsa, ES, 1-p A4C 20.3 ml/m^2 ---------- LA volume, ES, 1-p A2C 158 ml ---------- LA volume/bsa, ES, 1-p A2C 62.2 ml/m^2 ----------  Mitral valve  Value Reference Mitral E-wave peak velocity 85.4 cm/s ---------- Mitral A-wave peak velocity 102 cm/s ---------- Mitral deceleration time (H) 405 ms 150 - 230 Mitral peak gradient, D 3 mm Hg ---------- Mitral E/A ratio, peak 0.8 ---------- Mitral regurg VTI, PISA 161 cm ----------  Pulmonary arteries Value Reference PA pressure, S, DP 23 mm Hg <=30  Tricuspid valve Value Reference Tricuspid regurg peak velocity 222 cm/s ---------- Tricuspid peak RV-RA gradient 20 mm Hg ----------  Right atrium Value Reference RA ID, S-I, ES, A4C (H) 62.5 mm 34 - 49 RA area, ES, A4C (H) 25.3 cm^2 8.3 - 19.5 RA volume, ES, A/L 82.4 ml ---------- RA volume/bsa, ES, A/L 32.5 ml/m^2 ----------  Systemic veins Value Reference Estimated CVP 3 mm Hg ----------  Right ventricle Value Reference TAPSE 21.6 mm ---------- RV pressure, S, DP 23 mm Hg <=30 RV s&', lateral, S 13.6 cm/s ----------  Legend: (L) and (H) mark values outside specified reference range.  ------------------------------------------------------------------- Prepared and Electronically Authenticated by  Candee Furbish, M.D. 2019-06-26T13:15:17   ECHOCARDIOGRAM REPORT   Patient Name: Shary Decamp Date of Exam: 12/09/2018 Medical Rec #: [id number] Height: 75.0 in Accession #: [id number] Weight: 265.2 lb Date of Birth: Sep 25, 1956 BSA:  2.47 m Patient Age: 35 years BP: 122/86 mmHg Patient Gender: M HR: 118 bpm. Exam Location: [facility] [place]   Procedure: 2D Echo, Color Doppler and Cardiac Doppler  Indications: I34.0 Mitral regurgitation.  History: Patient has prior history of Echocardiogram examinations, most recent 04/27/2018. Dilated ascending aorta, Atrial Fibrillation, MR; Risk Factors: Hypertension; Medications: Obesity.  Sonographer: Coralyn Helling RDCS Referring Phys: [place]  BERRY  IMPRESSIONS   1. The left ventricle has moderately reduced systolic function of 29-51%. The cavity size was severely increased. There is mildly increased left ventricular wall thickness. Left ventricular diastology could not be evaluated Left ventricular diffuse  hypokinesis. 2. The right ventricle has moderately reduced systolic function. The cavity was normal. There is no increase in right ventricular wall thickness. 3. Left atrial size was severely dilated. 4. Right atrial size was severely dilated. 5. Possible flail segement of posterior MV leaflet. Mitral valve regurgitation is moderate by color flow Doppler. 6. The tricuspid valve is normal in structure. 7. The aortic valve is tricuspid There is mild thickening of the aortic valve. 8. The pulmonic valve was normal in structure. 9. There is mild dilatation of the aortic root and ascending aorta. 10. Moderate global reduction in LV systolic function; severe LVE; severe biatrial enlargement; moderate RV dysfunction; possible flail segment of posterior MV leaflet; eccentric MR difficult to quantitate; suggest TEE to further assess.  FINDINGS Left Ventricle: The left ventricle has moderately reduced systolic function of 88-41%. The cavity size was severely increased. There is mildly increased left ventricular wall thickness. Left ventricular diastology could  not be evaluated Left ventricular diffuse hypokinesis. Right Ventricle: The right ventricle has moderately reduced systolic function. The cavity was normal. There is no increase in right ventricular wall thickness. Left Atrium: left atrial size was severely dilated Right Atrium: right atrial size was severely dilated Interatrial Septum: No atrial level shunt detected by color flow Doppler.  Pericardium: There is no evidence of pericardial effusion. Mitral Valve: Possible flail segement of posterior MV leaflet. Mitral valve regurgitation is moderate by color flow Doppler. Tricuspid Valve: The tricuspid valve is normal in structure. Tricuspid valve regurgitation is mild by color flow Doppler. Aortic Valve: The aortic valve is tricuspid There is mild thickening of the aortic valve. Aortic valve regurgitation was not visualized by color flow Doppler. Pulmonic Valve: The pulmonic valve was normal in structure. Pulmonic valve regurgitation is trivial by color flow Doppler. Aorta: There is mild dilatation of the aortic root and ascending aorta measuring 39 mm. Venous: The inferior vena cava measures 2.40 cm, is normal in size with greater than 50% respiratory variability.  LEFT VENTRICLE PLAX 2D (Teich) LV EF: 46.8 % LVIDd: 6.45 cm LVIDs: 4.90 cm LV PW: 1.40 cm LV IVS: 1.10 cm LVOT diam: 2.30 cm LV SV: 99 ml LVOT Area: 4.15 cm  RIGHT VENTRICLE RVSP: 27.9 mmHg  LEFT ATRIUM Index RIGHT ATRIUM Index LA diam: 5.30 cm 2.14 cm/m RA Pressure: 10 mmHg LA Vol (A2C): 116.0 ml 46.89 ml/m RA Area: 32.70 cm LA Vol (A4C): 142.5 ml 57.60 ml/m RA Volume: 129.00 ml 52.14 ml/m LA Biplane Vol: 138.0 ml 55.78 ml/m AORTIC VALVE LVOT Vmax: 62.65 cm/s LVOT Vmean: 39.925 cm/s LVOT VTI: 0.104 m  AORTA Ao Root diam: 3.90 cm Ao Asc diam: 4.03 cm  MITRAL VALVE  TR Peak grad: 17.9 mmHg MV Area (PHT): TR Vmax: 239.00 cm/s MV PHT: RVSP: 27.9 mmHg MV Decel Time: 174 msec MV E velocity: 139.40 cm/s  IVC IVC diam: 2.40 cm   Kirk Ruths MD Electronically signed by Kirk Ruths MD Signature Date/Time: 12/09/2018/5:09:49 PM      PROCEDURE NOTE:  Procedure: Transesophageal echocardiogram Operator: Fransico Him, MD Indications:Atrial fibrillation Complications: None  During this procedure the patient is administered a total ofPropofol350mg  to achieve and maintain moderate conscious sedation. The patient's heart rate, blood pressure, and oxygen saturation are monitored continuously during the  procedure by anesthesia.  Results: Normal LV sizewith moderately reduced LVfunction with EF 30-35% Normal RV size andmoderately reducedfunction Severely dilatedRA with spontaneous echo contrast. Severely dilatedLA with spontaneous echo contrast. No evidence of LA or LAA thrombus.  Normal TVwith mild TR Normal PVwith trivial PR Markedly myoxmatous posterior mitral valve leaflet with prolapse of the P2 segment with partially flail leaflet secondary to probable ruptured chordae tendinae with moderate to severe mitral regurgitation which is very eccentric towards the interatrial septum and wraps around to the posterior wall but not into the pulmonary vein. Normal trileaflet AV Normal interatrial septum with no evidence of shunt by colorflow dopper  Normal thoracic and ascending aorta.  The patient tolerated the procedure well andwent on to DCCV  Signed: Fransico Him, MD [place]   Electrical Cardioversion Procedure Note MAICOL BOWLAND [id number] [birth year]  Procedure: Electrical Cardioversion Indications:Atrial Fibrillation  Time CWC:[id number] patient identification, verified procedure,medications/allergies/relevent history reviewed, required  imaging and test results available. Performed  Procedure Details  Cardioversion was done with synchronized biphasic defibrillation with AP pads with150watts. The patient converted to normal sinus rhythm. The patient tolerated the procedure well   IMPRESSION:  Successful cardioversion of atrial fibrillation  Patient has severe snoring with oxygen desaturations during sleep - recommend sleeps study.    Traci Turner 01/03/2019,8:00 AM   CT ANGIOGRAPHY CHEST, ABDOMEN AND PELVIS  TECHNIQUE: Multidetector CT imaging through the chest, abdomen and pelvis was performed using the standard protocol during bolus administration of intravenous contrast. Multiplanar reconstructed images and MIPs were obtained and reviewed to evaluate the vascular anatomy.  CONTRAST:  143mL ISOVUE-370 IOPAMIDOL (ISOVUE-370) INJECTION 76%  COMPARISON:  12/30/2018 chest radiograph.  FINDINGS: CTA CHEST FINDINGS  Cardiovascular: Moderate cardiomegaly with biatrial enlargement. No significant pericardial effusion/thickening. Atherosclerotic mildly tortuous nonaneurysmal thoracic aorta. No evidence of acute aortic syndrome. Patent aortic arch branch vessels. Dilated main pulmonary artery (3.8 cm diameter). No central pulmonary emboli.  Mediastinum/Nodes: No discrete thyroid nodules. Unremarkable esophagus. No pathologically enlarged axillary, mediastinal or hilar lymph nodes.  Lungs/Pleura: No pneumothorax. Small dependent right pleural effusion. No left pleural effusion. Mild passive atelectasis in the dependent lower lobes bilaterally. No acute consolidative airspace disease, lung masses or significant pulmonary nodules.  Musculoskeletal: No aggressive appearing focal osseous lesions. Moderate thoracic spondylosis.  Review of the MIP images confirms the above findings.  CTA ABDOMEN AND PELVIS FINDINGS  VASCULAR  Aorta: Moderately atherosclerotic. Normal caliber aorta  without aneurysm, dissection, vasculitis or significant stenosis. Minimal abdominal aortic luminal diameter 18.3 x 15.4 mm (series 6/image 148) in the infrarenal abdominal aorta.  Celiac: Patent without evidence of aneurysm, dissection, vasculitis or significant stenosis.  SMA: Patent without evidence of aneurysm, dissection, vasculitis or significant stenosis.  Renals: Both renal arteries are patent without evidence of aneurysm, dissection, vasculitis, fibromuscular dysplasia or significant stenosis.  IMA: Patent without evidence of aneurysm, dissection, vasculitis or significant stenosis.  Inflow: Patent without evidence of aneurysm, dissection, vasculitis or significant stenosis. Mild to moderate tortuosity of the right common iliac artery. Otherwise mild tortuosity of the iliac arteries bilaterally. Minimal atherosclerosis of the common iliac arteries bilaterally. Mild-to-moderate atherosclerotic plaque in the common femoral arteries bilaterally. Right common femoral artery is obscured by streak artifact from the right hip hardware. Estimated minimal luminal diameter in the right common femoral artery 9.1 x 6.1 mm. Estimated minimal luminal diameter in left common femoral artery 8.9 x 6.2 mm.  Veins: No obvious venous abnormality within the limitations of this arterial phase study.  Review of  the MIP images confirms the above findings.  NON-VASCULAR  Hepatobiliary: Normal liver size. Scattered simple liver cysts, largest 2.7 cm in the anterior left liver lobe. Subcentimeter hypodense left liver lobe lesion is too small to characterize and requires no follow-up unless the patient has risk factors for liver malignancy. No radiopaque cholelithiasis. No gallbladder distention. Suggestion of mild diffuse gallbladder wall thickening without definite pericholecystic fluid. No biliary ductal dilatation.  Pancreas: Normal, with no mass or duct dilation.  Spleen:  Normal size. No mass.  Adrenals/Urinary Tract: Normal adrenals. Simple parapelvic cysts in both kidneys. No hydronephrosis. Nonspecific bilateral symmetric perinephric fluid and fat stranding. No suspicious contour deforming renal masses. Nondistended bladder is obscured by streak artifact from right hip hardware and appears grossly normal.  Stomach/Bowel: Normal non-distended stomach. Normal caliber small bowel with no small bowel wall thickening. Normal appendix. Marked diffuse colonic diverticulosis, with no large bowel wall thickening or significant pericolonic fat stranding.  Vascular/Lymphatic: Atherosclerotic nonaneurysmal abdominal aorta. No pathologically enlarged lymph nodes in the abdomen or pelvis.  Reproductive: Normal size prostate with nonspecific internal prostatic calcifications.  Other: No pneumoperitoneum, ascites or focal fluid collection.  Musculoskeletal: No aggressive appearing focal osseous lesions. Right total hip arthroplasty. Moderate lumbar spondylosis.  Review of the MIP images confirms the above findings.  IMPRESSION: 1. No acute abnormality. Mild-to-moderate atherosclerosis in the aorta, iliac and femoral arteries. Mild-to-moderate tortuosity of the right common iliac artery. 2. Moderate cardiomegaly. 3. Dilated main pulmonary artery, suggesting pulmonary arterial hypertension. 4. Small dependent right pleural effusion. 5. Nonspecific mild diffuse gallbladder wall thickening without radiopaque cholelithiasis, more likely due to noninflammatory edema unless the patient has right upper quadrant abdominal symptoms. 6. Marked diffuse colonic diverticulosis. 7.  Aortic Atherosclerosis (ICD10-I70.0).   Electronically Signed   By: Ilona Sorrel M.D.   On: 01/04/2019 09:40   LEFT HEART CATH AND CORONARY ANGIOGRAPHY  Conclusion   1. Widely patent, angiographically normal coronary arteries 2. Normal LVEDP  Recommend: Continued  preoperative evaluation for mitral valve repair next week  Indications   Severe mitral regurgitation [I34.0 (ICD-10-CM)]  Procedural Details   Technical Details INDICATION: Severe mitral regurgitation, preoperative study with patient scheduled for mitral valve repair/maze surgery next week  PROCEDURAL DETAILS: The right wrist is prepped, draped, and anesthetized with 1% lidocaine. Using the modified Seldinger technique, a 5/6 French Slender sheath is introduced into the right radial artery. 3 mg of verapamil is administered through the sheath, weight-based unfractionated heparin was administered intravenously. Attempts were made to change out a right antecubital IV to a 4/5 slender sheath. The wire would not cross into the vein more than just a few centimeters. I was able to insert the 4/5 French slender sheath and perform a venogram which demonstrated very small and tortuous veins, not amenable to central access. The right heart catheterization is aborted. Standard Judkins catheters are used for selective coronary angiography. LV pressure is recorded and an aortic valve pullback gradient is measured. Catheter exchanges are performed over an exchange length guidewire. There are no immediate procedural complications. A TR band is used for radial hemostasis at the completion of the procedure. The patient was transferred to the post catheterization recovery area for further monitoring.    Estimated blood loss <50 mL.   During this procedure medications were administered to achieve and maintain moderate conscious sedation while the patient's heart rate, blood pressure, and oxygen saturation were continuously monitored and I was present face-to-face 100% of this time.  Medications  (Filter: Administrations  occurring from 03/16/19 0758 to 03/16/19 0844)          Medication Rate/Dose/Volume Action  Date Time   midazolam (VERSED) injection (mg) 2 mg Given 03/16/19 0810   Total dose as of  03/20/19 1029 2 mg Given 0815   4 mg        fentaNYL (SUBLIMAZE) injection (mcg) 25 mcg Given 03/16/19 0810   Total dose as of 03/20/19 1029 25 mcg Given 0815   50 mcg        lidocaine (PF) (XYLOCAINE) 1 % injection (mL) 2 mL Given 03/16/19 0816   Total dose as of 03/20/19 1029 5 mL Given 0824   7 mL        Radial Cocktail/Verapamil only (mL) 10 mL Given 03/16/19 0824   Total dose as of 03/20/19 1029        10 mL        heparin injection (Units) 5,000 Units Given 03/16/19 0826   Total dose as of 03/20/19 1029        5,000 Units        Heparin (Porcine) in NaCl 1000-0.9 UT/500ML-% SOLN (mL) 500 mL Given 03/16/19 0838   Total dose as of 03/20/19 1029 500 mL Given 0839   1,000 mL        iohexol (OMNIPAQUE) 350 MG/ML injection (mL) 55 mL Given 03/16/19 0839   Total dose as of 03/20/19 1029        55 mL        Sedation Time   Sedation Time Physician-1: 22 minutes 49 seconds  Coronary Findings   Diagnostic  Dominance: Right  Left Main  Vessel is large. Vessel is angiographically normal. The left main is angiographically normal with no obstruction. Divides into the LAD and left circumflex.  Left Anterior Descending  Vessel is large.  Left Circumflex  Vessel is large. Vessel is angiographically normal.  Right Coronary Artery  Vessel is large. Vessel is angiographically normal. Large, dominant vessel. No obstructive disease is present. The vessel supplies PDA and PLA branches with no stenoses.  Intervention   No interventions have been documented.  Coronary Diagrams   Diagnostic  Dominance: Right    Intervention   Implants       No implant documentation for this case.  Syngo Images   Show images for CARDIAC CATHETERIZATION  Images on Long Term Storage   Show images for [place], [place] to Procedure Log   Procedure Log    Hemo Data    Most Recent Value  AO Systolic Pressure 82  mmHg  AO Diastolic Pressure 58 mmHg  AO Mean 70 mmHg  LV Systolic Pressure 86 mmHg  LV Diastolic Pressure 2 mmHg  LV EDP 19 mmHg  AOp Systolic Pressure 88 mmHg  AOp Diastolic Pressure 5 mmHg  AOp Mean Pressure 64 mmHg  LVp Systolic Pressure 87 mmHg  LVp Diastolic Pressure 54 mmHg  LVp EDP Pressure 60 mmHg      Impression:  Patient has mitral valve prolapse with stage D severe symptomatic primary mitral regurgitation. He was hospitalized approximately 2 months ago with acute exacerbation of likely chronic combined systolic and diastolic congestive heart failure that occurred in the setting of presumably recent onset persistent atrial fibrillation and atrial flutter. He underwent cardioversion during his hospitalization and diuresed a large amount of fluid. He has remained clinically stable since hospital discharge although he continues to have symptoms of exertional shortness of breath and increasing fatigue consistent  with chronic diastolic congestive heart failure, [place] Heart Association functional class II.   EKG performed last week reveals the patient has gone back into rate controlled persistent atrial fibrillation. Previously reviewed images from the patient's transthoracic and transesophageal echocardiograms revealed large flail segment involving the middle scallop of the posterior leaflet of mitral valve with severe mitral regurgitation. There was moderate to severe left ventricular systolic dysfunction and severe biatrial enlargement.   I have personally reviewed the patient's diagnostic cardiac catheterization performed last week which reveals widely patent coronary arteries with no significant coronary artery disease.The patient denies any symptoms of fever or cough. He has not been traveling and he has been practicing strict social distancing guidelines. He has not been exposed to any persons with known or suspected COVID-19 infection.    Plan:  The patient  was again counseled at length regarding the indications, risks and potential benefits of mitral valve repair and maze procedure.  The rationale for elective surgery has been explained, including a comparison between surgery and continued medical therapy with close follow-up.  The likelihood of successful and durable mitral valve repair has been discussed with particular reference to the findings of their recent echocardiogram.  Based upon these findings and previous experience, I have quoted them a greater than 95 percent likelihood of successful valve repair with less than 1 percent risk of mortality or major morbidity.  Alternative surgical approaches have been discussed including a comparison between conventional sternotomy and minimally-invasive techniques.  The relative risks and benefits of each have been reviewed as they pertain to the patient's specific circumstances, and expectations for the patient's postoperative convalescence has been discussed.    The patient understands and accepts all potential risks of surgery including but not limited to risk of death, stroke or other neurologic complication, myocardial infarction, congestive heart failure, respiratory failure, renal failure, bleeding requiring transfusion and/or reexploration, arrhythmia, infection or other wound complications, pneumonia, pleural and/or pericardial effusion, pulmonary embolus, aortic dissection or other major vascular complication, or delayed complications related to valve repair or replacement including but not limited to structural valve deterioration and failure, thrombosis, embolization, endocarditis, or paravalvular leak.  Specific risks potentially related to the minimally-invasive approach were discussed at length, including but not limited to risk of conversion to full or partial sternotomy, aortic dissection or other major vascular complication, unilateral acute lung injury or pulmonary edema, phrenic nerve dysfunction  or paralysis, rib fracture, chronic pain, lung hernia, or lymphocele. All of his questions have been answered.      Valentina Gu. Roxy Manns, MD 03/20/2019 9:26 AM

## 2019-03-20 NOTE — Progress Notes (Signed)
[address]       [place] [address]             [phone]     CARDIOTHORACIC SURGERY OFFICE NOTE  Referring Provider is Nahser, Wonda Cheng, MD Primary Cardiologist is Quay Burow, MD PCP is [name], MD   HPI:  Patient is a 63 year old male with mitral valve prolapse and severe mitral regurgitation, hypertension, and degenerative arthritis who was recently hospitalized with decompensated acute on chronic combined systolic and diastolic congestive heart failure in the setting of presumably recent onset persistent atrial fibrillation and atrial flutter. He underwent TEE and cardioversion and is clinically done well since sinus rhythm was restored. I had the opportunity to see him in consultation on January 03, 2019. I last spoke with him over the telephone on Mar 06, 2019.  Since then the patient underwent diagnostic cardiac catheterization and he returns to the office today with tentative plans to proceed with elective minimally invasive mitral valve repair and Maze procedure in the operating room tomorrow.  He describes stable symptoms of exertional shortness of breath.  He denies resting shortness of breath, PND, orthopnea, palpitations, dizzy spells, or syncope.  Routine preoperative EKG revealed the patient is currently in rate controlled persistent atrial fibrillation.  He was unaware that he had gone back out of rhythm.  He has been practicing social distancing recommendations per guidelines and he denies any recent fevers, chills, worsening shortness of breath, or cough.  He is eager to proceed with surgery as previously planned.   Current Outpatient Medications  Medication Sig Dispense Refill   amiodarone (PACERONE) 200 MG tablet Take 1 tablet (200 mg total) by mouth daily. 30 tablet 1   diltiazem 2 % GEL Apply 1 application topically 3 (three) times daily. With 5 % lidocaine (Patient taking differently: Apply 1 application topically 3 (three) times  daily as needed (rectal discomfort). With 5 % lidocaine) 30 g 1   diphenhydrAMINE (SOMINEX) 25 MG tablet Take 25 mg by mouth at bedtime.     docusate sodium (COLACE) 100 MG capsule Take 300 mg by mouth daily.     HYDROcodone-acetaminophen (NORCO) 10-325 MG tablet Take 1 tablet by mouth every 8 (eight) hours as needed.     KLOR-CON M10 10 MEQ tablet Take 10 mEq by mouth 2 (two) times daily.     metoprolol tartrate (LOPRESSOR) 100 MG tablet Take 50 mg by mouth 2 (two) times daily.     sacubitril-valsartan (ENTRESTO) 24-26 MG Take 1 tablet by mouth 2 (two) times daily. 60 tablet 6   spironolactone (ALDACTONE) 25 MG tablet Take 1 tablet (25 mg total) by mouth daily. 30 tablet 6   torsemide (DEMADEX) 20 MG tablet Take 1 tablet (20 mg total) by mouth daily. 30 tablet 6   ALPRAZolam (XANAX) 0.5 MG tablet Take 0.5 mg by mouth at bedtime.      Multiple Vitamin (MULTIVITAMIN WITH MINERALS) TABS tablet Take 1 tablet by mouth daily.     No current facility-administered medications for this visit.    Facility-Administered Medications Ordered in Other Visits  Medication Dose Route Frequency Provider Last Rate Last Dose   [START ON 03/21/2019] cefUROXime (ZINACEF) 1.5 g in sodium chloride 0.9 % 100 mL IVPB  1.5 g Intravenous To OR Rexene Alberts, MD       [START ON 03/21/2019] cefUROXime (ZINACEF) 750 mg in sodium chloride 0.9 % 100 mL IVPB  750 mg Intravenous To OR Roxy Manns,  Valentina Gu, MD       [START ON 03/21/2019] dexmedetomidine (PRECEDEX) 400 MCG/100ML (4 mcg/mL) infusion  0.1-0.7 mcg/kg/hr Intravenous To OR Rexene Alberts, MD       [START ON 03/21/2019] DOPamine (INTROPIN) 800 mg in dextrose 5 % 250 mL (3.2 mg/mL) infusion  0-10 mcg/kg/min Intravenous To OR Rexene Alberts, MD       [START ON 03/21/2019] EPINEPHrine (ADRENALIN) 4 mg in dextrose 5 % 250 mL (0.016 mg/mL) infusion  0-10 mcg/min Intravenous To OR Rexene Alberts, MD       [START ON 03/21/2019] heparin 2,500 Units, papaverine 30  mg in electrolyte-148 (PLASMALYTE-148) 500 mL irrigation   Irrigation To OR Rexene Alberts, MD       [START ON 03/21/2019] heparin 30,000 units/NS 1000 mL solution for CELLSAVER   Other To OR Rexene Alberts, MD       [START ON 03/21/2019] insulin regular, human (MYXREDLIN) 100 units/ 100 mL infusion   Intravenous To OR Rexene Alberts, MD       [START ON 03/21/2019] [facility]ople[facility] (KBC) lidocaine 2% Syringe (14mL)  13 mL Intracoronary To OR Rexene Alberts, MD       [START ON 03/21/2019] [facility] Blood Cardioplegia (KBC) lidocaine 2% Syringe (14mL)  13 mL Intracoronary To OR Rexene Alberts, MD       [START ON 03/21/2019] [facility]ople[facility] ([facility]BC) mannitol 20% Syringe (18mL)  32 mL Intracoronary To OR Rexene Alberts, MD       [START ON 03/21/2019] [facility] Cardioplegia (KBC) mannitol 20% Syringe (76mL)  32 mL Intracoronary To OR Rexene Alberts, MD       [START ON 03/21/2019] magnesium sulfate (IV Push/IM) injection 40 mEq  40 mEq Other To OR Rexene Alberts, MD       [START ON 03/21/2019] milrinone (PRIMACOR) 20 MG/100 ML (0.2 mg/mL) infusion  0.3 mcg/kg/min Intravenous To OR Rexene Alberts, MD       [START ON 03/21/2019] nitroGLYCERIN 50 mg in dextrose 5 % 250 mL (0.2 mg/mL) infusion  2-200 mcg/min Intravenous To OR Rexene Alberts, MD       [START ON 03/21/2019] norepinephrine (LEVOPHED) 4mg  in 288mL premix infusion  0-40 mcg/min Intravenous To OR Rexene Alberts, MD       [START ON 03/21/2019] phenylephrine (NEOSYNEPHRINE) 20-0.9 MG/250ML-% infusion  30-200 mcg/min Intravenous To OR Rexene Alberts, MD       [START ON 03/21/2019] potassium chloride injection 80 mEq  80 mEq Other To OR Rexene Alberts, MD       [START ON 03/21/2019] tranexamic acid (CYKLOKAPRON) 2,500 mg in sodium chloride 0.9 % 250 mL (10 mg/mL) infusion  1.5 mg/kg/hr Intravenous To OR Rexene Alberts, MD       [START ON 03/21/2019] tranexamic acid (CYKLOKAPRON) bolus via  infusion - over 30 minutes 1,639.5 mg  15 mg/kg Intravenous To OR Rexene Alberts, MD       [START ON 03/21/2019] tranexamic acid (CYKLOKAPRON) pump prime solution 219 mg  2 mg/kg Intracatheter To OR Rexene Alberts, MD       [START ON 03/21/2019] vancomycin (VANCOCIN) 1,000 mg in sodium chloride 0.9 % 1,000 mL irrigation   Irrigation To OR Rexene Alberts, MD       [START ON 03/21/2019] vancomycin (VANCOCIN) 1,500 mg in sodium chloride 0.9 % 250 mL IVPB  1,500 mg Intravenous To OR Rexene Alberts, MD  Physical Exam:   BP 113/82 (BP Location: Right Arm, Patient Position: Sitting, Cuff Size: Normal)    Pulse 73    Temp 98.9 F (37.2 C) (Oral)    Resp 16    Ht 6\' 3"  (1.905 m)    Wt 241 lb (109.3 kg)    SpO2 95% Comment: ON RA   BMI 30.12 kg/m   General:  Well-appearing  Chest:   Clear to auscultation  CV:   Irregular rate and rhythm with grade 3/6 holosystolic murmur heard at the apex  Incisions:  n/a  Abdomen:  Soft nontender  Extremities:  Warm and well-perfused  Diagnostic Tests:  LEFT HEART CATH AND CORONARY ANGIOGRAPHY  Conclusion   1.  Widely patent, angiographically normal coronary arteries 2.  Normal LVEDP  Recommend: Continued preoperative evaluation for mitral valve repair next week  Indications   Severe mitral regurgitation [I34.0 (ICD-10-CM)]  Procedural Details   Technical Details INDICATION: Severe mitral regurgitation, preoperative study with patient scheduled for mitral valve repair/maze surgery next week  PROCEDURAL DETAILS: The right wrist is prepped, draped, and anesthetized with 1% lidocaine. Using the modified Seldinger technique, a 5/6 French Slender sheath is introduced into the right radial artery. 3 mg of verapamil is administered through the sheath, weight-based unfractionated heparin was administered intravenously.  Attempts were made to change out a right antecubital IV to a 4/5 slender sheath.  The wire would not cross into the vein more than  just a few centimeters.  I was able to insert the 4/5 French slender sheath and perform a venogram which demonstrated very small and tortuous veins, not amenable to central access.  The right heart catheterization is aborted.  Standard Judkins catheters are used for selective coronary angiography. LV pressure is recorded and an aortic valve pullback gradient is measured.  Catheter exchanges are performed over an exchange length guidewire. There are no immediate procedural complications. A TR band is used for radial hemostasis at the completion of the procedure.  The patient was transferred to the post catheterization recovery area for further monitoring.    Estimated blood loss <50 mL.   During this procedure medications were administered to achieve and maintain moderate conscious sedation while the patient's heart rate, blood pressure, and oxygen saturation were continuously monitored and I was present face-to-face 100% of this time.  Medications  (Filter: Administrations occurring from 03/16/19 0758 to 03/16/19 0844)  Medication Rate/Dose/Volume Action  Date Time   midazolam (VERSED) injection (mg) 2 mg Given 03/16/19 0810   Total dose as of 03/20/19 1029 2 mg Given 0815   4 mg        fentaNYL (SUBLIMAZE) injection (mcg) 25 mcg Given 03/16/19 0810   Total dose as of 03/20/19 1029 25 mcg Given 0815   50 mcg        lidocaine (PF) (XYLOCAINE) 1 % injection (mL) 2 mL Given 03/16/19 0816   Total dose as of 03/20/19 1029 5 mL Given 0824   7 mL        Radial Cocktail/Verapamil only (mL) 10 mL Given 03/16/19 0824   Total dose as of 03/20/19 1029        10 mL        heparin injection (Units) 5,000 Units Given 03/16/19 0826   Total dose as of 03/20/19 1029        5,000 Units        Heparin (Porcine) in NaCl 1000-0.9 UT/500ML-% SOLN (mL) 500 mL Given 03/16/19 9509  Total dose as of 03/20/19 1029 500 mL Given 0839   1,000 mL        iohexol (OMNIPAQUE) 350 MG/ML injection (mL) 55 mL Given 03/16/19  0839   Total dose as of 03/20/19 1029        55 mL        Sedation Time   Sedation Time Physician-1: 22 minutes 49 seconds  Coronary Findings   Diagnostic  Dominance: Right  Left Main  Vessel is large. Vessel is angiographically normal. The left main is angiographically normal with no obstruction. Divides into the LAD and left circumflex.  Left Anterior Descending  Vessel is large.  Left Circumflex  Vessel is large. Vessel is angiographically normal.  Right Coronary Artery  Vessel is large. Vessel is angiographically normal. Large, dominant vessel. No obstructive disease is present. The vessel supplies PDA and PLA branches with no stenoses.  Intervention   No interventions have been documented.  Coronary Diagrams   Diagnostic  Dominance: Right    Intervention   Implants    No implant documentation for this case.  Syngo Images   Show images for CARDIAC CATHETERIZATION  Images on Long Term Storage   Show images for Davone, [place] to Procedure Log   Procedure Log    Hemo Data    Most Recent Value  AO Systolic Pressure 82 mmHg  AO Diastolic Pressure 58 mmHg  AO Mean 70 mmHg  LV Systolic Pressure 86 mmHg  LV Diastolic Pressure 2 mmHg  LV EDP 19 mmHg  AOp Systolic Pressure 88 mmHg  AOp Diastolic Pressure 5 mmHg  AOp Mean Pressure 64 mmHg  LVp Systolic Pressure 87 mmHg  LVp Diastolic Pressure 54 mmHg  LVp EDP Pressure 60 mmHg      Impression:  Patient has mitral valve prolapse with stage D severe symptomatic primary mitral regurgitation.  He was hospitalized approximately 2 months ago with acute exacerbation of likely chronic combined systolic and diastolic congestive heart failure that occurred in the setting of presumably recent onset persistent atrial fibrillation and atrial flutter.  He underwent cardioversion during his hospitalization and diuresed a large amount of fluid.  He has remained clinically stable since hospital discharge although he  continues to have symptoms of exertional shortness of breath and increasing fatigue consistent with chronic diastolic congestive heart failure, New [place] Heart Association functional class II.    EKG performed last week reveals the patient has gone back into rate controlled persistent atrial fibrillation.  Previously reviewed images from the patient's transthoracic and transesophageal echocardiograms revealed large flail segment involving the middle scallop of the posterior leaflet of mitral valve with severe mitral regurgitation.  There was moderate to severe left ventricular systolic dysfunction and severe biatrial enlargement.    I have personally reviewed the patient's diagnostic cardiac catheterization performed last week which reveals widely patent coronary arteries with no significant coronary artery disease. The patient denies any symptoms of fever or cough.  He has not been traveling and he has been practicing strict social distancing guidelines.  He has not been exposed to any persons with known or suspected COVID-19 infection.    Plan:  The patient was again counseled at length regarding the indications, risks and potential benefits of mitral valve repair and maze procedure.  The rationale for elective surgery has been explained, including a comparison between surgery and continued medical therapy with close follow-up.  The likelihood of successful and durable mitral valve repair has been discussed with  particular reference to the findings of their recent echocardiogram.  Based upon these findings and previous experience, I have quoted them a greater than 95 percent likelihood of successful valve repair with less than 1 percent risk of mortality or major morbidity.  Alternative surgical approaches have been discussed including a comparison between conventional sternotomy and minimally-invasive techniques.  The relative risks and benefits of each have been reviewed as they pertain to the patient's  specific circumstances, and expectations for the patient's postoperative convalescence has been discussed.    The patient understands and accepts all potential risks of surgery including but not limited to risk of death, stroke or other neurologic complication, myocardial infarction, congestive heart failure, respiratory failure, renal failure, bleeding requiring transfusion and/or reexploration, arrhythmia, infection or other wound complications, pneumonia, pleural and/or pericardial effusion, pulmonary embolus, aortic dissection or other major vascular complication, or delayed complications related to valve repair or replacement including but not limited to structural valve deterioration and failure, thrombosis, embolization, endocarditis, or paravalvular leak.  Specific risks potentially related to the minimally-invasive approach were discussed at length, including but not limited to risk of conversion to full or partial sternotomy, aortic dissection or other major vascular complication, unilateral acute lung injury or pulmonary edema, phrenic nerve dysfunction or paralysis, rib fracture, chronic pain, lung hernia, or lymphocele. All of his questions have been answered.    I spent in excess of 30 minutes during the conduct of this office consultation and >50% of this time involved direct face-to-face encounter with the patient for counseling and/or coordination of their care.    Valentina Gu. Roxy Manns, MD 03/20/2019 9:26 AM

## 2019-03-20 NOTE — Patient Instructions (Signed)
  Have nothing to eat or drink after midnight the night before surgery.  On the morning of surgery take only metoprolol with a sip of water.

## 2019-03-21 ENCOUNTER — Encounter (HOSPITAL_COMMUNITY): Payer: Self-pay

## 2019-03-21 ENCOUNTER — Inpatient Hospital Stay (HOSPITAL_COMMUNITY): Payer: BC Managed Care – PPO

## 2019-03-21 ENCOUNTER — Other Ambulatory Visit: Payer: Self-pay

## 2019-03-21 ENCOUNTER — Inpatient Hospital Stay (HOSPITAL_COMMUNITY): Payer: BC Managed Care – PPO | Admitting: Physician Assistant

## 2019-03-21 ENCOUNTER — Inpatient Hospital Stay (HOSPITAL_COMMUNITY): Payer: BC Managed Care – PPO | Admitting: Vascular Surgery

## 2019-03-21 ENCOUNTER — Encounter (HOSPITAL_COMMUNITY)
Admission: RE | Disposition: A | Discharge status: Home / Self Care | Payer: Self-pay | Source: Home / Self Care | Attending: Thoracic Surgery (Cardiothoracic Vascular Surgery)

## 2019-03-21 ENCOUNTER — Inpatient Hospital Stay (HOSPITAL_COMMUNITY)
Admission: RE | Admit: 2019-03-21 | Discharge: 2019-03-27 | DRG: 219 | Disposition: A | Discharge status: Home / Self Care | Payer: BC Managed Care – PPO | Attending: Thoracic Surgery (Cardiothoracic Vascular Surgery) | Admitting: Thoracic Surgery (Cardiothoracic Vascular Surgery)

## 2019-03-21 DIAGNOSIS — Z9889 Other specified postprocedural states: Secondary | ICD-10-CM

## 2019-03-21 DIAGNOSIS — I110 Hypertensive heart disease with heart failure: Secondary | ICD-10-CM | POA: Diagnosis present

## 2019-03-21 DIAGNOSIS — I4510 Unspecified right bundle-branch block: Secondary | ICD-10-CM | POA: Diagnosis present

## 2019-03-21 DIAGNOSIS — I498 Other specified cardiac arrhythmias: Secondary | ICD-10-CM | POA: Diagnosis not present

## 2019-03-21 DIAGNOSIS — G4730 Sleep apnea, unspecified: Secondary | ICD-10-CM | POA: Diagnosis present

## 2019-03-21 DIAGNOSIS — Z800 Family history of malignant neoplasm of digestive organs: Secondary | ICD-10-CM

## 2019-03-21 DIAGNOSIS — I341 Nonrheumatic mitral (valve) prolapse: Secondary | ICD-10-CM | POA: Diagnosis present

## 2019-03-21 DIAGNOSIS — I4892 Unspecified atrial flutter: Secondary | ICD-10-CM | POA: Diagnosis not present

## 2019-03-21 DIAGNOSIS — I4891 Unspecified atrial fibrillation: Secondary | ICD-10-CM | POA: Diagnosis not present

## 2019-03-21 DIAGNOSIS — I442 Atrioventricular block, complete: Secondary | ICD-10-CM | POA: Diagnosis not present

## 2019-03-21 DIAGNOSIS — Z833 Family history of diabetes mellitus: Secondary | ICD-10-CM

## 2019-03-21 DIAGNOSIS — E669 Obesity, unspecified: Secondary | ICD-10-CM | POA: Diagnosis present

## 2019-03-21 DIAGNOSIS — I358 Other nonrheumatic aortic valve disorders: Secondary | ICD-10-CM | POA: Diagnosis not present

## 2019-03-21 DIAGNOSIS — I4819 Other persistent atrial fibrillation: Secondary | ICD-10-CM | POA: Diagnosis not present

## 2019-03-21 DIAGNOSIS — Z96641 Presence of right artificial hip joint: Secondary | ICD-10-CM | POA: Diagnosis not present

## 2019-03-21 DIAGNOSIS — I340 Nonrheumatic mitral (valve) insufficiency: Secondary | ICD-10-CM | POA: Diagnosis not present

## 2019-03-21 DIAGNOSIS — Z8249 Family history of ischemic heart disease and other diseases of the circulatory system: Secondary | ICD-10-CM

## 2019-03-21 DIAGNOSIS — D6959 Other secondary thrombocytopenia: Secondary | ICD-10-CM | POA: Diagnosis present

## 2019-03-21 DIAGNOSIS — J986 Disorders of diaphragm: Secondary | ICD-10-CM | POA: Diagnosis not present

## 2019-03-21 DIAGNOSIS — Z20828 Contact with and (suspected) exposure to other viral communicable diseases: Secondary | ICD-10-CM | POA: Diagnosis not present

## 2019-03-21 DIAGNOSIS — R001 Bradycardia, unspecified: Secondary | ICD-10-CM | POA: Diagnosis not present

## 2019-03-21 DIAGNOSIS — I5042 Chronic combined systolic (congestive) and diastolic (congestive) heart failure: Secondary | ICD-10-CM | POA: Diagnosis not present

## 2019-03-21 DIAGNOSIS — I959 Hypotension, unspecified: Secondary | ICD-10-CM | POA: Diagnosis not present

## 2019-03-21 DIAGNOSIS — J9811 Atelectasis: Secondary | ICD-10-CM

## 2019-03-21 DIAGNOSIS — D62 Acute posthemorrhagic anemia: Secondary | ICD-10-CM | POA: Diagnosis not present

## 2019-03-21 DIAGNOSIS — I420 Dilated cardiomyopathy: Secondary | ICD-10-CM | POA: Diagnosis present

## 2019-03-21 DIAGNOSIS — I440 Atrioventricular block, first degree: Secondary | ICD-10-CM | POA: Diagnosis not present

## 2019-03-21 DIAGNOSIS — I5022 Chronic systolic (congestive) heart failure: Secondary | ICD-10-CM | POA: Diagnosis not present

## 2019-03-21 DIAGNOSIS — M1990 Unspecified osteoarthritis, unspecified site: Secondary | ICD-10-CM | POA: Diagnosis not present

## 2019-03-21 DIAGNOSIS — Z8679 Personal history of other diseases of the circulatory system: Secondary | ICD-10-CM | POA: Diagnosis not present

## 2019-03-21 DIAGNOSIS — E876 Hypokalemia: Secondary | ICD-10-CM | POA: Diagnosis not present

## 2019-03-21 DIAGNOSIS — R079 Chest pain, unspecified: Secondary | ICD-10-CM | POA: Diagnosis not present

## 2019-03-21 DIAGNOSIS — Z981 Arthrodesis status: Secondary | ICD-10-CM | POA: Diagnosis not present

## 2019-03-21 DIAGNOSIS — Z96653 Presence of artificial knee joint, bilateral: Secondary | ICD-10-CM | POA: Diagnosis present

## 2019-03-21 DIAGNOSIS — J984 Other disorders of lung: Secondary | ICD-10-CM | POA: Diagnosis not present

## 2019-03-21 DIAGNOSIS — I083 Combined rheumatic disorders of mitral, aortic and tricuspid valves: Secondary | ICD-10-CM | POA: Diagnosis present

## 2019-03-21 DIAGNOSIS — K219 Gastro-esophageal reflux disease without esophagitis: Secondary | ICD-10-CM | POA: Diagnosis present

## 2019-03-21 DIAGNOSIS — Z6834 Body mass index (BMI) 34.0-34.9, adult: Secondary | ICD-10-CM | POA: Diagnosis not present

## 2019-03-21 DIAGNOSIS — I511 Rupture of chordae tendineae, not elsewhere classified: Secondary | ICD-10-CM | POA: Diagnosis present

## 2019-03-21 HISTORY — PX: TEE WITHOUT CARDIOVERSION: SHX5443

## 2019-03-21 HISTORY — DX: Personal history of other diseases of the circulatory system: Z86.79

## 2019-03-21 HISTORY — PX: MINIMALLY INVASIVE MAZE PROCEDURE: SHX6244

## 2019-03-21 HISTORY — DX: Other specified postprocedural states: Z98.890

## 2019-03-21 HISTORY — PX: CLIPPING OF ATRIAL APPENDAGE: SHX5773

## 2019-03-21 HISTORY — PX: MITRAL VALVE REPAIR: SHX2039

## 2019-03-21 LAB — POCT I-STAT 7, (LYTES, BLD GAS, ICA,H+H)
Acid-Base Excess: 1 mmol/L (ref 0.0–2.0)
Acid-Base Excess: 2 mmol/L (ref 0.0–2.0)
Acid-base deficit: 2 mmol/L (ref 0.0–2.0)
Acid-base deficit: 1 mmol/L (ref 0.0–2.0)
Acid-base deficit: 3 mmol/L — ABNORMAL HIGH (ref 0.0–2.0)
Acid-base deficit: 2 mmol/L (ref 0.0–2.0)
Bicarbonate: 28 mmol/L (ref 20.0–28.0)
Bicarbonate: 28.3 mmol/L — ABNORMAL HIGH (ref 20.0–28.0)
Bicarbonate: 25.3 mmol/L (ref 20.0–28.0)
Bicarbonate: 23.9 mmol/L (ref 20.0–28.0)
Bicarbonate: 22.2 mmol/L (ref 20.0–28.0)
Bicarbonate: 22.9 mmol/L (ref 20.0–28.0)
Calcium, Ion: 1.04 mmol/L — ABNORMAL LOW (ref 1.15–1.40)
Calcium, Ion: 1.17 mmol/L (ref 1.15–1.40)
Calcium, Ion: 1.1 mmol/L — ABNORMAL LOW (ref 1.15–1.40)
Calcium, Ion: 1.1 mmol/L — ABNORMAL LOW (ref 1.15–1.40)
Calcium, Ion: 1.09 mmol/L — ABNORMAL LOW (ref 1.15–1.40)
Calcium, Ion: 1.08 mmol/L — ABNORMAL LOW (ref 1.15–1.40)
HCT: 37 % — ABNORMAL LOW (ref 39.0–52.0)
HCT: 44 % (ref 39.0–52.0)
HCT: 35 % — ABNORMAL LOW (ref 39.0–52.0)
HCT: 38 % — ABNORMAL LOW (ref 39.0–52.0)
HCT: 38 % — ABNORMAL LOW (ref 39.0–52.0)
HCT: 35 % — ABNORMAL LOW (ref 39.0–52.0)
Hemoglobin: 12.6 g/dL — ABNORMAL LOW (ref 13.0–17.0)
Hemoglobin: 15 g/dL (ref 13.0–17.0)
Hemoglobin: 11.9 g/dL — ABNORMAL LOW (ref 13.0–17.0)
Hemoglobin: 12.9 g/dL — ABNORMAL LOW (ref 13.0–17.0)
Hemoglobin: 12.9 g/dL — ABNORMAL LOW (ref 13.0–17.0)
Hemoglobin: 11.9 g/dL — ABNORMAL LOW (ref 13.0–17.0)
O2 Saturation: 100 %
O2 Saturation: 100 %
O2 Saturation: 88 %
O2 Saturation: 98 %
O2 Saturation: 92 %
O2 Saturation: 93 %
Patient temperature: 36.3
Patient temperature: 37
Patient temperature: 37.3
Potassium: 4.9 mmol/L (ref 3.5–5.1)
Potassium: 5.1 mmol/L (ref 3.5–5.1)
Potassium: 4.8 mmol/L (ref 3.5–5.1)
Potassium: 4.8 mmol/L (ref 3.5–5.1)
Potassium: 4.4 mmol/L (ref 3.5–5.1)
Potassium: 4.3 mmol/L (ref 3.5–5.1)
Sodium: 140 mmol/L (ref 135–145)
Sodium: 142 mmol/L (ref 135–145)
Sodium: 140 mmol/L (ref 135–145)
Sodium: 141 mmol/L (ref 135–145)
Sodium: 142 mmol/L (ref 135–145)
Sodium: 143 mmol/L (ref 135–145)
TCO2: 30 mmol/L (ref 22–32)
TCO2: 30 mmol/L (ref 22–32)
TCO2: 27 mmol/L (ref 22–32)
TCO2: 25 mmol/L (ref 22–32)
TCO2: 23 mmol/L (ref 22–32)
TCO2: 24 mmol/L (ref 22–32)
pCO2 arterial: 50.6 mmHg — ABNORMAL HIGH (ref 32.0–48.0)
pCO2 arterial: 52.5 mmHg — ABNORMAL HIGH (ref 32.0–48.0)
pCO2 arterial: 52.4 mmHg — ABNORMAL HIGH (ref 32.0–48.0)
pCO2 arterial: 39.9 mmHg (ref 32.0–48.0)
pCO2 arterial: 39.1 mmHg (ref 32.0–48.0)
pCO2 arterial: 38.1 mmHg (ref 32.0–48.0)
pH, Arterial: 7.335 — ABNORMAL LOW (ref 7.350–7.450)
pH, Arterial: 7.355 (ref 7.350–7.450)
pH, Arterial: 7.291 — ABNORMAL LOW (ref 7.350–7.450)
pH, Arterial: 7.381 (ref 7.350–7.450)
pH, Arterial: 7.361 (ref 7.350–7.450)
pH, Arterial: 7.389 (ref 7.350–7.450)
pO2, Arterial: 198 mmHg — ABNORMAL HIGH (ref 83.0–108.0)
pO2, Arterial: 335 mmHg — ABNORMAL HIGH (ref 83.0–108.0)
pO2, Arterial: 61 mmHg — ABNORMAL LOW (ref 83.0–108.0)
pO2, Arterial: 95 mmHg (ref 83.0–108.0)
pO2, Arterial: 67 mmHg — ABNORMAL LOW (ref 83.0–108.0)
pO2, Arterial: 68 mmHg — ABNORMAL LOW (ref 83.0–108.0)

## 2019-03-21 LAB — POCT I-STAT, CHEM 8
BUN: 22 mg/dL (ref 8–23)
Calcium, Ion: 1.1 mmol/L — ABNORMAL LOW (ref 1.15–1.40)
Chloride: 110 mmol/L (ref 98–111)
Creatinine, Ser: 0.8 mg/dL (ref 0.61–1.24)
Glucose, Bld: 121 mg/dL — ABNORMAL HIGH (ref 70–99)
HCT: 35 % — ABNORMAL LOW (ref 39.0–52.0)
Hemoglobin: 11.9 g/dL — ABNORMAL LOW (ref 13.0–17.0)
Potassium: 4.1 mmol/L (ref 3.5–5.1)
Sodium: 142 mmol/L (ref 135–145)
TCO2: 23 mmol/L (ref 22–32)

## 2019-03-21 LAB — POCT I-STAT 4, (NA,K, GLUC, HGB,HCT)
Glucose, Bld: 115 mg/dL — ABNORMAL HIGH (ref 70–99)
Glucose, Bld: 133 mg/dL — ABNORMAL HIGH (ref 70–99)
Glucose, Bld: 135 mg/dL — ABNORMAL HIGH (ref 70–99)
Glucose, Bld: 161 mg/dL — ABNORMAL HIGH (ref 70–99)
Glucose, Bld: 162 mg/dL — ABNORMAL HIGH (ref 70–99)
Glucose, Bld: 123 mg/dL — ABNORMAL HIGH (ref 70–99)
HCT: 43 % (ref 39.0–52.0)
HCT: 37 % — ABNORMAL LOW (ref 39.0–52.0)
HCT: 44 % (ref 39.0–52.0)
HCT: 36 % — ABNORMAL LOW (ref 39.0–52.0)
HCT: 36 % — ABNORMAL LOW (ref 39.0–52.0)
HCT: 36 % — ABNORMAL LOW (ref 39.0–52.0)
Hemoglobin: 14.6 g/dL (ref 13.0–17.0)
Hemoglobin: 12.6 g/dL — ABNORMAL LOW (ref 13.0–17.0)
Hemoglobin: 15 g/dL (ref 13.0–17.0)
Hemoglobin: 12.2 g/dL — ABNORMAL LOW (ref 13.0–17.0)
Hemoglobin: 12.2 g/dL — ABNORMAL LOW (ref 13.0–17.0)
Hemoglobin: 12.2 g/dL — ABNORMAL LOW (ref 13.0–17.0)
Potassium: 4.6 mmol/L (ref 3.5–5.1)
Potassium: 5 mmol/L (ref 3.5–5.1)
Potassium: 5 mmol/L (ref 3.5–5.1)
Potassium: 5 mmol/L (ref 3.5–5.1)
Potassium: 5.3 mmol/L — ABNORMAL HIGH (ref 3.5–5.1)
Potassium: 4.7 mmol/L (ref 3.5–5.1)
Sodium: 140 mmol/L (ref 135–145)
Sodium: 139 mmol/L (ref 135–145)
Sodium: 141 mmol/L (ref 135–145)
Sodium: 137 mmol/L (ref 135–145)
Sodium: 138 mmol/L (ref 135–145)
Sodium: 140 mmol/L (ref 135–145)

## 2019-03-21 LAB — CBC
HCT: 39.4 % (ref 39.0–52.0)
HCT: 36.3 % — ABNORMAL LOW (ref 39.0–52.0)
Hemoglobin: 12.6 g/dL — ABNORMAL LOW (ref 13.0–17.0)
Hemoglobin: 11.6 g/dL — ABNORMAL LOW (ref 13.0–17.0)
MCH: 30.8 pg (ref 26.0–34.0)
MCH: 30.9 pg (ref 26.0–34.0)
MCHC: 32 g/dL (ref 30.0–36.0)
MCHC: 32 g/dL (ref 30.0–36.0)
MCV: 96.3 fL (ref 80.0–100.0)
MCV: 96.5 fL (ref 80.0–100.0)
Platelets: 103 10*3/uL — ABNORMAL LOW (ref 150–400)
Platelets: 93 10*3/uL — ABNORMAL LOW (ref 150–400)
RBC: 4.09 MIL/uL — ABNORMAL LOW (ref 4.22–5.81)
RBC: 3.76 MIL/uL — ABNORMAL LOW (ref 4.22–5.81)
RDW: 17.6 % — ABNORMAL HIGH (ref 11.5–15.5)
RDW: 17.7 % — ABNORMAL HIGH (ref 11.5–15.5)
WBC: 12.7 10*3/uL — ABNORMAL HIGH (ref 4.0–10.5)
WBC: 11.5 10*3/uL — ABNORMAL HIGH (ref 4.0–10.5)
nRBC: 0 % (ref 0.0–0.2)
nRBC: 0 % (ref 0.0–0.2)

## 2019-03-21 LAB — GLUCOSE, CAPILLARY
Glucose-Capillary: 111 mg/dL — ABNORMAL HIGH (ref 70–99)
Glucose-Capillary: 118 mg/dL — ABNORMAL HIGH (ref 70–99)
Glucose-Capillary: 123 mg/dL — ABNORMAL HIGH (ref 70–99)
Glucose-Capillary: 127 mg/dL — ABNORMAL HIGH (ref 70–99)
Glucose-Capillary: 131 mg/dL — ABNORMAL HIGH (ref 70–99)
Glucose-Capillary: 132 mg/dL — ABNORMAL HIGH (ref 70–99)
Glucose-Capillary: 132 mg/dL — ABNORMAL HIGH (ref 70–99)
Glucose-Capillary: 134 mg/dL — ABNORMAL HIGH (ref 70–99)
Glucose-Capillary: 144 mg/dL — ABNORMAL HIGH (ref 70–99)

## 2019-03-21 LAB — HEMOGLOBIN AND HEMATOCRIT, BLOOD
HCT: 36.6 % — ABNORMAL LOW (ref 39.0–52.0)
Hemoglobin: 11.9 g/dL — ABNORMAL LOW (ref 13.0–17.0)

## 2019-03-21 LAB — ECHO INTRAOPERATIVE TEE
Height: 75 in
Weight: 3856 oz

## 2019-03-21 LAB — CREATININE, SERUM
Creatinine, Ser: 0.9 mg/dL (ref 0.61–1.24)
GFR calc Af Amer: >60 mL/min (ref >60)
GFR calc non Af Amer: >60 mL/min (ref >60)

## 2019-03-21 LAB — POCT I-STAT EG7
Acid-base deficit: 2 mmol/L (ref 0.0–2.0)
Bicarbonate: 26 mmol/L (ref 20.0–28.0)
Calcium, Ion: 1.06 mmol/L — ABNORMAL LOW (ref 1.15–1.40)
HCT: 35 % — ABNORMAL LOW (ref 39.0–52.0)
Hemoglobin: 11.9 g/dL — ABNORMAL LOW (ref 13.0–17.0)
O2 Saturation: 63 %
Potassium: 4.6 mmol/L (ref 3.5–5.1)
Sodium: 141 mmol/L (ref 135–145)
TCO2: 28 mmol/L (ref 22–32)
pCO2, Ven: 56.1 mmHg (ref 44.0–60.0)
pH, Ven: 7.273 (ref 7.250–7.430)
pO2, Ven: 38 mmHg (ref 32.0–45.0)

## 2019-03-21 LAB — PLATELET COUNT: Platelets: 118 10*3/uL — ABNORMAL LOW (ref 150–400)

## 2019-03-21 LAB — SARS CORONAVIRUS 2 BY RT PCR (HOSPITAL ORDER, PERFORMED IN ~~LOC~~ HOSPITAL LAB): SARS Coronavirus 2: NEGATIVE

## 2019-03-21 LAB — PROTIME-INR
INR: 1.5 — ABNORMAL HIGH (ref 0.8–1.2)
Prothrombin Time: 18 seconds — ABNORMAL HIGH (ref 11.4–15.2)

## 2019-03-21 LAB — COOXEMETRY PANEL
Carboxyhemoglobin: 1.8 % — ABNORMAL HIGH (ref 0.5–1.5)
Methemoglobin: 0.9 % (ref 0.0–1.5)
O2 Saturation: 72.9 %
Total hemoglobin: 12.7 g/dL (ref 12.0–16.0)

## 2019-03-21 LAB — APTT: aPTT: 29 seconds (ref 24–36)

## 2019-03-21 LAB — MAGNESIUM: Magnesium: 3.2 mg/dL — ABNORMAL HIGH (ref 1.7–2.4)

## 2019-03-21 SURGERY — REPAIR, MITRAL VALVE, MINIMALLY INVASIVE
Anesthesia: General | Site: Chest | Laterality: Right

## 2019-03-21 MED ORDER — HEPARIN SODIUM (PORCINE) 1000 UNIT/ML IJ SOLN
INTRAMUSCULAR | Status: AC
  Filled 2019-03-21: qty 1

## 2019-03-21 MED ORDER — MIDAZOLAM HCL 5 MG/5ML IJ SOLN
INTRAMUSCULAR | Status: DC | PRN
  Administered 2019-03-21: 1 mg via INTRAVENOUS
  Administered 2019-03-21: .5 mg via INTRAVENOUS
  Administered 2019-03-21 (×2): 2 mg via INTRAVENOUS
  Administered 2019-03-21: 2.5 mg via INTRAVENOUS
  Administered 2019-03-21: 2 mg via INTRAVENOUS

## 2019-03-21 MED ORDER — LACTATED RINGERS IV SOLN
500.0000 mL | Freq: Once | INTRAVENOUS | Status: AC | PRN
  Administered 2019-03-22: 500 mL via INTRAVENOUS

## 2019-03-21 MED ORDER — SODIUM CHLORIDE 0.9 % IV SOLN
1.5000 g | Freq: Two times a day (BID) | INTRAVENOUS | Status: AC
  Administered 2019-03-21 – 2019-03-23 (×4): 1.5 g via INTRAVENOUS
  Filled 2019-03-21 (×4): qty 1.5

## 2019-03-21 MED ORDER — HEPARIN SODIUM (PORCINE) 1000 UNIT/ML IJ SOLN
INTRAMUSCULAR | Status: DC | PRN
  Administered 2019-03-21: 39000 [IU] via INTRAVENOUS

## 2019-03-21 MED ORDER — OXYCODONE HCL 5 MG PO TABS
5.0000 mg | ORAL_TABLET | ORAL | Status: DC | PRN
  Administered 2019-03-21 – 2019-03-24 (×16): 10 mg via ORAL
  Administered 2019-03-25: 5 mg via ORAL
  Administered 2019-03-25: 10:00:00 10 mg via ORAL
  Administered 2019-03-25 (×2): 5 mg via ORAL
  Administered 2019-03-26: 10 mg via ORAL
  Administered 2019-03-26: 5 mg via ORAL
  Administered 2019-03-26 – 2019-03-27 (×4): 10 mg via ORAL
  Administered 2019-03-27: 5 mg via ORAL
  Filled 2019-03-21 (×3): qty 2
  Filled 2019-03-21: qty 1
  Filled 2019-03-21 (×6): qty 2
  Filled 2019-03-21 (×2): qty 1
  Filled 2019-03-21 (×6): qty 2
  Filled 2019-03-21: qty 1
  Filled 2019-03-21 (×4): qty 2
  Filled 2019-03-21: qty 1
  Filled 2019-03-21 (×4): qty 2

## 2019-03-21 MED ORDER — CHLORHEXIDINE GLUCONATE 0.12 % MT SOLN
15.0000 mL | Freq: Once | OROMUCOSAL | Status: AC
  Administered 2019-03-21: 06:00:00 15 mL via OROMUCOSAL
  Filled 2019-03-21: qty 15

## 2019-03-21 MED ORDER — SODIUM CHLORIDE 0.9 % IV SOLN
INTRAVENOUS | Status: DC | PRN
  Administered 2019-03-21: 14:00:00 750 mg via INTRAVENOUS

## 2019-03-21 MED ORDER — ALBUMIN HUMAN 5 % IV SOLN
250.0000 mL | INTRAVENOUS | Status: DC | PRN
  Administered 2019-03-21 (×2): 12.5 g via INTRAVENOUS
  Filled 2019-03-21 (×2): qty 250

## 2019-03-21 MED ORDER — SODIUM CHLORIDE 0.9 % IV SOLN
250.0000 mL | INTRAVENOUS | Status: DC
  Administered 2019-03-24 (×2): via INTRAVENOUS

## 2019-03-21 MED ORDER — SODIUM CHLORIDE 0.9% FLUSH
3.0000 mL | Freq: Two times a day (BID) | INTRAVENOUS | Status: DC
  Administered 2019-03-22 – 2019-03-27 (×9): 3 mL via INTRAVENOUS

## 2019-03-21 MED ORDER — METOPROLOL TARTRATE 5 MG/5ML IV SOLN: 2.5000 mg | INTRAVENOUS | Status: DC | PRN

## 2019-03-21 MED ORDER — FENTANYL CITRATE (PF) 250 MCG/5ML IJ SOLN
INTRAMUSCULAR | Status: AC
  Filled 2019-03-21: qty 20

## 2019-03-21 MED ORDER — CHLORHEXIDINE GLUCONATE CLOTH 2 % EX PADS
6.0000 | MEDICATED_PAD | Freq: Every day | CUTANEOUS | Status: DC
  Administered 2019-03-21 – 2019-03-26 (×5): 6 via TOPICAL

## 2019-03-21 MED ORDER — MILRINONE LACTATE IN DEXTROSE 20-5 MG/100ML-% IV SOLN
0.0000 ug/kg/min | INTRAVENOUS | Status: DC
  Administered 2019-03-21: 21:00:00 0.25 ug/kg/min via INTRAVENOUS
  Administered 2019-03-22: 12:00:00 0.125 ug/kg/min via INTRAVENOUS
  Filled 2019-03-21 (×2): qty 100

## 2019-03-21 MED ORDER — ONDANSETRON HCL 4 MG/2ML IJ SOLN
4.0000 mg | Freq: Four times a day (QID) | INTRAMUSCULAR | Status: DC | PRN
  Administered 2019-03-24: 4 mg via INTRAVENOUS

## 2019-03-21 MED ORDER — CHLORHEXIDINE GLUCONATE 0.12% ORAL RINSE (MEDLINE KIT)
15.0000 mL | Freq: Two times a day (BID) | OROMUCOSAL | Status: DC
  Administered 2019-03-22 (×2): 15 mL via OROMUCOSAL

## 2019-03-21 MED ORDER — BUPIVACAINE HCL (PF) 0.5 % IJ SOLN
INTRAMUSCULAR | Status: AC
  Filled 2019-03-21: qty 10

## 2019-03-21 MED ORDER — METOPROLOL TARTRATE 12.5 MG HALF TABLET: 12.5000 mg | ORAL_TABLET | Freq: Once | ORAL | Status: DC

## 2019-03-21 MED ORDER — ROCURONIUM BROMIDE 10 MG/ML (PF) SYRINGE
PREFILLED_SYRINGE | INTRAVENOUS | Status: AC
  Filled 2019-03-21: qty 10

## 2019-03-21 MED ORDER — SODIUM CHLORIDE 0.45 % IV SOLN
INTRAVENOUS | Status: DC | PRN
  Administered 2019-03-21: 15:00:00 via INTRAVENOUS

## 2019-03-21 MED ORDER — ROCURONIUM BROMIDE 10 MG/ML (PF) SYRINGE
PREFILLED_SYRINGE | INTRAVENOUS | Status: AC
  Filled 2019-03-21: qty 20

## 2019-03-21 MED ORDER — 0.9 % SODIUM CHLORIDE (POUR BTL) OPTIME
TOPICAL | Status: DC | PRN
  Administered 2019-03-21: 4000 mL

## 2019-03-21 MED ORDER — DOPAMINE-DEXTROSE 3.2-5 MG/ML-% IV SOLN
0.0000 ug/kg/min | INTRAVENOUS | Status: DC
  Administered 2019-03-21: 3 ug/kg/min via INTRAVENOUS

## 2019-03-21 MED ORDER — LACTATED RINGERS IV SOLN
INTRAVENOUS | Status: DC
  Administered 2019-03-22: 02:00:00 via INTRAVENOUS

## 2019-03-21 MED ORDER — PHENYLEPHRINE HCL-NACL 20-0.9 MG/250ML-% IV SOLN
0.0000 ug/min | INTRAVENOUS | Status: DC
  Administered 2019-03-21: 22:00:00 20 ug/min via INTRAVENOUS
  Filled 2019-03-21: qty 250

## 2019-03-21 MED ORDER — ASPIRIN 81 MG PO CHEW: 324.0000 mg | CHEWABLE_TABLET | Freq: Every day | ORAL | Status: DC

## 2019-03-21 MED ORDER — TRAMADOL HCL 50 MG PO TABS
50.0000 mg | ORAL_TABLET | ORAL | Status: DC | PRN
  Administered 2019-03-22 – 2019-03-23 (×2): 50 mg via ORAL
  Filled 2019-03-21 (×2): qty 1

## 2019-03-21 MED ORDER — PANTOPRAZOLE SODIUM 40 MG PO TBEC
40.0000 mg | DELAYED_RELEASE_TABLET | Freq: Every day | ORAL | Status: DC
  Administered 2019-03-23 – 2019-03-27 (×5): 40 mg via ORAL
  Filled 2019-03-21 (×5): qty 1

## 2019-03-21 MED ORDER — SODIUM CHLORIDE 0.9 % IV SOLN
INTRAVENOUS | Status: DC | PRN
  Administered 2019-03-21: 30 ug/min via INTRAVENOUS

## 2019-03-21 MED ORDER — ROCURONIUM BROMIDE 10 MG/ML (PF) SYRINGE
PREFILLED_SYRINGE | INTRAVENOUS | Status: DC | PRN
  Administered 2019-03-21 (×2): 50 mg via INTRAVENOUS
  Administered 2019-03-21: 100 mg via INTRAVENOUS
  Administered 2019-03-21: 40 mg via INTRAVENOUS
  Administered 2019-03-21 (×2): 30 mg via INTRAVENOUS
  Administered 2019-03-21 (×3): 50 mg via INTRAVENOUS
  Administered 2019-03-21: 30 mg via INTRAVENOUS
  Administered 2019-03-21: 20 mg via INTRAVENOUS

## 2019-03-21 MED ORDER — ACETAMINOPHEN 160 MG/5ML PO SOLN: 1000.0000 mg | Freq: Four times a day (QID) | ORAL | Status: DC

## 2019-03-21 MED ORDER — INSULIN REGULAR BOLUS VIA INFUSION
0.0000 [IU] | Freq: Three times a day (TID) | INTRAVENOUS | Status: DC
  Filled 2019-03-21: qty 10

## 2019-03-21 MED ORDER — ALBUMIN HUMAN 5 % IV SOLN
INTRAVENOUS | Status: DC | PRN
  Administered 2019-03-21: 14:00:00 via INTRAVENOUS

## 2019-03-21 MED ORDER — NOREPINEPHRINE BITARTRATE 1 MG/ML IV SOLN
INTRAVENOUS | Status: DC | PRN
  Administered 2019-03-21: 11 ug/min via INTRAVENOUS

## 2019-03-21 MED ORDER — ACETAMINOPHEN 160 MG/5ML PO SOLN: 650.0000 mg | Freq: Once | ORAL | Status: AC

## 2019-03-21 MED ORDER — CHLORHEXIDINE GLUCONATE 0.12 % MT SOLN
15.0000 mL | OROMUCOSAL | Status: AC
  Administered 2019-03-21: 15:00:00 15 mL via OROMUCOSAL

## 2019-03-21 MED ORDER — ORAL CARE MOUTH RINSE
15.0000 mL | Freq: Two times a day (BID) | OROMUCOSAL | Status: DC
  Administered 2019-03-22 – 2019-03-27 (×5): 15 mL via OROMUCOSAL

## 2019-03-21 MED ORDER — MIDAZOLAM HCL (PF) 10 MG/2ML IJ SOLN
INTRAMUSCULAR | Status: AC
  Filled 2019-03-21: qty 2

## 2019-03-21 MED ORDER — PROTAMINE SULFATE 10 MG/ML IV SOLN
INTRAVENOUS | Status: DC | PRN
  Administered 2019-03-21: 390 mg via INTRAVENOUS

## 2019-03-21 MED ORDER — MIDAZOLAM HCL 2 MG/2ML IJ SOLN: 2.0000 mg | INTRAMUSCULAR | Status: DC | PRN

## 2019-03-21 MED ORDER — PROPOFOL 10 MG/ML IV BOLUS
INTRAVENOUS | Status: AC
  Filled 2019-03-21: qty 20

## 2019-03-21 MED ORDER — CHLORHEXIDINE GLUCONATE 4 % EX LIQD: 30.0000 mL | CUTANEOUS | Status: DC

## 2019-03-21 MED ORDER — MORPHINE SULFATE (PF) 2 MG/ML IV SOLN
1.0000 mg | INTRAVENOUS | Status: DC | PRN
  Administered 2019-03-21 – 2019-03-22 (×5): 2 mg via INTRAVENOUS
  Filled 2019-03-21 (×5): qty 1

## 2019-03-21 MED ORDER — BISACODYL 5 MG PO TBEC
10.0000 mg | DELAYED_RELEASE_TABLET | Freq: Every day | ORAL | Status: DC
  Administered 2019-03-22 – 2019-03-27 (×6): 10 mg via ORAL
  Filled 2019-03-21 (×6): qty 2

## 2019-03-21 MED ORDER — LACTATED RINGERS IV SOLN
INTRAVENOUS | Status: DC | PRN
  Administered 2019-03-21: 07:00:00 via INTRAVENOUS

## 2019-03-21 MED ORDER — NITROGLYCERIN IN D5W 200-5 MCG/ML-% IV SOLN: 0.0000 ug/min | INTRAVENOUS | Status: DC

## 2019-03-21 MED ORDER — BUPIVACAINE HCL (PF) 0.5 % IJ SOLN
INTRAMUSCULAR | Status: DC | PRN
  Administered 2019-03-21: 10 mL

## 2019-03-21 MED ORDER — PROPOFOL 10 MG/ML IV BOLUS
INTRAVENOUS | Status: DC | PRN
  Administered 2019-03-21: 150 mg via INTRAVENOUS

## 2019-03-21 MED ORDER — FAMOTIDINE IN NACL 20-0.9 MG/50ML-% IV SOLN
20.0000 mg | Freq: Two times a day (BID) | INTRAVENOUS | Status: DC
  Administered 2019-03-21: 20 mg via INTRAVENOUS
  Filled 2019-03-21 (×2): qty 50

## 2019-03-21 MED ORDER — GLYCOPYRROLATE PF 0.2 MG/ML IJ SOSY
PREFILLED_SYRINGE | INTRAMUSCULAR | Status: AC
  Filled 2019-03-21: qty 1

## 2019-03-21 MED ORDER — SODIUM CHLORIDE 0.9 % IV SOLN: INTRAVENOUS | Status: DC

## 2019-03-21 MED ORDER — LACTATED RINGERS IV SOLN
INTRAVENOUS | Status: DC | PRN
  Administered 2019-03-21: 06:00:00 via INTRAVENOUS

## 2019-03-21 MED ORDER — NOREPINEPHRINE 4 MG/250ML-% IV SOLN
0.0000 ug/min | INTRAVENOUS | Status: DC
  Filled 2019-03-21: qty 250

## 2019-03-21 MED ORDER — LACTATED RINGERS IV SOLN: INTRAVENOUS | Status: DC

## 2019-03-21 MED ORDER — POTASSIUM CHLORIDE 10 MEQ/50ML IV SOLN: 10.0000 meq | INTRAVENOUS | Status: AC

## 2019-03-21 MED ORDER — SODIUM CHLORIDE 0.9 % IV SOLN
INTRAVENOUS | Status: DC
  Administered 2019-03-21: 15:00:00 via INTRAVENOUS

## 2019-03-21 MED ORDER — SODIUM CHLORIDE (PF) 0.9 % IJ SOLN
INTRAMUSCULAR | Status: AC
  Filled 2019-03-21: qty 10

## 2019-03-21 MED ORDER — PROTAMINE SULFATE 10 MG/ML IV SOLN
INTRAVENOUS | Status: AC
  Filled 2019-03-21: qty 5

## 2019-03-21 MED ORDER — INSULIN REGULAR(HUMAN) IN NACL 100-0.9 UT/100ML-% IV SOLN: INTRAVENOUS | Status: DC

## 2019-03-21 MED ORDER — SODIUM CHLORIDE 0.9 % IV SOLN
INTRAVENOUS | Status: DC | PRN
  Administered 2019-03-21: 13:00:00 20 ug/min via INTRAVENOUS

## 2019-03-21 MED ORDER — BUPIVACAINE 0.5 % ON-Q PUMP SINGLE CATH 400 ML
400.0000 mL | INJECTION | Status: DC
  Filled 2019-03-21: qty 400

## 2019-03-21 MED ORDER — VANCOMYCIN HCL IN DEXTROSE 1-5 GM/200ML-% IV SOLN
1000.0000 mg | Freq: Once | INTRAVENOUS | Status: AC
  Administered 2019-03-21: 21:00:00 1000 mg via INTRAVENOUS
  Filled 2019-03-21: qty 200

## 2019-03-21 MED ORDER — ORAL CARE MOUTH RINSE
15.0000 mL | OROMUCOSAL | Status: DC
  Administered 2019-03-21: 15 mL via OROMUCOSAL

## 2019-03-21 MED ORDER — SODIUM CHLORIDE 0.9 % IV SOLN
INTRAVENOUS | Status: DC | PRN
  Administered 2019-03-21: 14:00:00 via INTRAVENOUS

## 2019-03-21 MED ORDER — DEXMEDETOMIDINE HCL IN NACL 200 MCG/50ML IV SOLN
0.0000 ug/kg/h | INTRAVENOUS | Status: DC
  Administered 2019-03-21: 16:00:00 0.4 ug/kg/h via INTRAVENOUS
  Filled 2019-03-21: qty 50

## 2019-03-21 MED ORDER — FENTANYL CITRATE (PF) 250 MCG/5ML IJ SOLN
INTRAMUSCULAR | Status: AC
  Filled 2019-03-21: qty 5

## 2019-03-21 MED ORDER — BISACODYL 10 MG RE SUPP
10.0000 mg | Freq: Every day | RECTAL | Status: DC
  Filled 2019-03-21: qty 1

## 2019-03-21 MED ORDER — ASPIRIN EC 325 MG PO TBEC
325.0000 mg | DELAYED_RELEASE_TABLET | Freq: Every day | ORAL | Status: DC
  Administered 2019-03-22 – 2019-03-23 (×2): 325 mg via ORAL
  Filled 2019-03-21 (×2): qty 1

## 2019-03-21 MED ORDER — PROTAMINE SULFATE 10 MG/ML IV SOLN
INTRAVENOUS | Status: AC
  Filled 2019-03-21: qty 25

## 2019-03-21 MED ORDER — DOCUSATE SODIUM 100 MG PO CAPS
200.0000 mg | ORAL_CAPSULE | Freq: Every day | ORAL | Status: DC
  Administered 2019-03-22 – 2019-03-27 (×6): 200 mg via ORAL
  Filled 2019-03-21 (×6): qty 2

## 2019-03-21 MED ORDER — ACETAMINOPHEN 650 MG RE SUPP
650.0000 mg | Freq: Once | RECTAL | Status: AC
  Administered 2019-03-21: 15:00:00 650 mg via RECTAL

## 2019-03-21 MED ORDER — ACETAMINOPHEN 500 MG PO TABS
1000.0000 mg | ORAL_TABLET | Freq: Four times a day (QID) | ORAL | Status: AC
  Administered 2019-03-21 – 2019-03-26 (×14): 1000 mg via ORAL
  Filled 2019-03-21 (×15): qty 2

## 2019-03-21 MED ORDER — MAGNESIUM SULFATE 4 GM/100ML IV SOLN
4.0000 g | Freq: Once | INTRAVENOUS | Status: AC
  Administered 2019-03-21: 15:00:00 4 g via INTRAVENOUS
  Filled 2019-03-21: qty 100

## 2019-03-21 MED ORDER — ROCURONIUM BROMIDE 10 MG/ML (PF) SYRINGE
PREFILLED_SYRINGE | INTRAVENOUS | Status: AC
  Filled 2019-03-21: qty 30

## 2019-03-21 MED ORDER — FENTANYL CITRATE (PF) 250 MCG/5ML IJ SOLN
INTRAMUSCULAR | Status: DC | PRN
  Administered 2019-03-21: 50 ug via INTRAVENOUS
  Administered 2019-03-21: 200 ug via INTRAVENOUS
  Administered 2019-03-21: 100 ug via INTRAVENOUS
  Administered 2019-03-21: 50 ug via INTRAVENOUS
  Administered 2019-03-21: 150 ug via INTRAVENOUS

## 2019-03-21 MED ORDER — SODIUM CHLORIDE 0.9% FLUSH: 3.0000 mL | INTRAVENOUS | Status: DC | PRN

## 2019-03-21 SURGICAL SUPPLY — 129 items
ADAPTER CARDIO PERF ANTE/RETRO (ADAPTER) ×4 IMPLANT
ARTICLIP LAA PROCLIP II 45 (Clip) ×4 IMPLANT
BAG DECANTER FOR FLEXI CONT (MISCELLANEOUS) ×8 IMPLANT
BLADE STERNUM SYSTEM 6 (BLADE) ×1 IMPLANT
BLADE SURG 11 STRL SS (BLADE) ×4 IMPLANT
BOOT SUTURE AID YELLOW STND (SUTURE) ×1 IMPLANT
CANISTER SUCT 3000ML PPV (MISCELLANEOUS) ×8 IMPLANT
CANNULA FEM VENOUS REMOTE 22FR (CANNULA) ×1 IMPLANT
CANNULA FEMORAL ART 14 SM (MISCELLANEOUS) ×4 IMPLANT
CANNULA GUNDRY RCSP 15FR (MISCELLANEOUS) ×4 IMPLANT
CANNULA OPTISITE PERFUSION 16F (CANNULA) IMPLANT
CANNULA OPTISITE PERFUSION 18F (CANNULA) ×1 IMPLANT
CANNULA SUMP PERICARDIAL (CANNULA) ×8 IMPLANT
CARDIOBLATE CARDIAC ABLATION (MISCELLANEOUS)
CATH CPB KIT OWEN (MISCELLANEOUS) IMPLANT
CATH KIT ON Q 5IN SLV (PAIN MANAGEMENT) ×1 IMPLANT
CELLS DAT CNTRL 66122 CELL SVR (MISCELLANEOUS) ×3 IMPLANT
CLAMP OLL ABLATION (MISCELLANEOUS) ×1 IMPLANT
CONN ST 1/4X3/8  BEN (MISCELLANEOUS) ×2
CONN ST 1/4X3/8 BEN (MISCELLANEOUS) ×6 IMPLANT
CONNECTOR 1/2X3/8X1/2 3 WAY (MISCELLANEOUS) ×1
CONNECTOR 1/2X3/8X1/2 3WAY (MISCELLANEOUS) ×3 IMPLANT
CONT SPEC 4OZ CLIKSEAL STRL BL (MISCELLANEOUS) ×4 IMPLANT
COVER BACK TABLE 24X17X13 BIG (DRAPES) ×4 IMPLANT
COVER WAND RF STERILE (DRAPES) ×4 IMPLANT
CRADLE DONUT ADULT HEAD (MISCELLANEOUS) ×4 IMPLANT
DERMABOND ADHESIVE PROPEN (GAUZE/BANDAGES/DRESSINGS) ×1
DERMABOND ADVANCED (GAUZE/BANDAGES/DRESSINGS) ×2
DERMABOND ADVANCED .7 DNX12 (GAUZE/BANDAGES/DRESSINGS) ×6 IMPLANT
DERMABOND ADVANCED .7 DNX6 (GAUZE/BANDAGES/DRESSINGS) IMPLANT
DEVICE ATRICLIP LAA PRCLPII 45 (Clip) IMPLANT
DEVICE CARDIOBLATE CARDIAC ABL (MISCELLANEOUS) IMPLANT
DEVICE CLOSURE PERCLS PRGLD 6F (VASCULAR PRODUCTS) IMPLANT
DEVICE PMI PUNCTURE CLOSURE (MISCELLANEOUS) ×4 IMPLANT
DEVICE SUT CK QUICK LOAD MINI (Prosthesis & Implant Heart) ×2 IMPLANT
DEVICE TROCAR PUNCTURE CLOSURE (ENDOMECHANICALS) ×4 IMPLANT
DRAIN CHANNEL 28F RND 3/8 FF (WOUND CARE) ×8 IMPLANT
DRAPE BILATERAL SPLIT (DRAPES) ×4 IMPLANT
DRAPE C-ARM 42X72 X-RAY (DRAPES) ×4 IMPLANT
DRAPE CV SPLIT W-CLR ANES SCRN (DRAPES) ×4 IMPLANT
DRAPE INCISE IOBAN 66X45 STRL (DRAPES) ×12 IMPLANT
DRAPE SLUSH/WARMER DISC (DRAPES) ×4 IMPLANT
DRSG AQUACEL AG ADV 3.5X 6 (GAUZE/BANDAGES/DRESSINGS) ×1 IMPLANT
DRSG COVADERM 4X8 (GAUZE/BANDAGES/DRESSINGS) ×4 IMPLANT
ELECT BLADE 6.5 EXT (BLADE) ×4 IMPLANT
ELECT REM PT RETURN 9FT ADLT (ELECTROSURGICAL) ×8
ELECTRODE REM PT RTRN 9FT ADLT (ELECTROSURGICAL) ×6 IMPLANT
FELT TEFLON 1X6 (MISCELLANEOUS) ×8 IMPLANT
FEMORAL VENOUS CANN RAP (CANNULA) IMPLANT
GAUZE SPONGE 4X4 12PLY STRL (GAUZE/BANDAGES/DRESSINGS) ×4 IMPLANT
GAUZE SPONGE 4X4 12PLY STRL LF (GAUZE/BANDAGES/DRESSINGS) ×4 IMPLANT
GLOVE BIO SURGEON STRL SZ 6.5 (GLOVE) ×5 IMPLANT
GLOVE BIOGEL PI IND STRL 7.0 (GLOVE) IMPLANT
GLOVE BIOGEL PI INDICATOR 7.0 (GLOVE) ×1
GLOVE ORTHO TXT STRL SZ7.5 (GLOVE) ×12 IMPLANT
GOWN STRL REUS W/ TWL LRG LVL3 (GOWN DISPOSABLE) ×12 IMPLANT
GOWN STRL REUS W/TWL LRG LVL3 (GOWN DISPOSABLE) ×9
IV NS 1000ML (IV SOLUTION) ×1
IV NS 1000ML BAXH (IV SOLUTION) IMPLANT
IV NS IRRIG 3000ML ARTHROMATIC (IV SOLUTION) ×1 IMPLANT
KIT BASIN OR (CUSTOM PROCEDURE TRAY) ×4 IMPLANT
KIT DILATOR VASC 18G NDL (KITS) ×4 IMPLANT
KIT DRAINAGE VACCUM ASSIST (KITS) ×1 IMPLANT
KIT SUCTION CATH 14FR (SUCTIONS) ×4 IMPLANT
KIT SUT CK MINI COMBO 4X17 (Prosthesis & Implant Heart) ×1 IMPLANT
KIT TURNOVER KIT B (KITS) ×4 IMPLANT
LEAD PACING MYOCARDI (MISCELLANEOUS) ×5 IMPLANT
LINE VENT (MISCELLANEOUS) ×1 IMPLANT
NDL AORTIC ROOT 14G 7F (CATHETERS) ×3 IMPLANT
NEEDLE AORTIC ROOT 14G 7F (CATHETERS) ×4 IMPLANT
NS IRRIG 1000ML POUR BTL (IV SOLUTION) ×19 IMPLANT
PACK E MIN INVASIVE VALVE (SUTURE) ×4 IMPLANT
PACK OPEN HEART (CUSTOM PROCEDURE TRAY) ×4 IMPLANT
PAD ARMBOARD 7.5X6 YLW CONV (MISCELLANEOUS) ×8 IMPLANT
PAD ELECT DEFIB RADIOL ZOLL (MISCELLANEOUS) ×4 IMPLANT
PERCLOSE PROGLIDE 6F (VASCULAR PRODUCTS) ×12
PROBE CRYO2-ABLATION MALLABLE (MISCELLANEOUS) ×1 IMPLANT
RETRACTOR WND ALEXIS 18 MED (MISCELLANEOUS) ×3 IMPLANT
RING MITRAL MEMO 4D 36 (Prosthesis & Implant Heart) ×1 IMPLANT
RTRCTR WOUND ALEXIS 18CM MED (MISCELLANEOUS) ×4
SET CANNULATION TOURNIQUET (MISCELLANEOUS) ×4 IMPLANT
SET CARDIOPLEGIA MPS 5001102 (MISCELLANEOUS) ×1 IMPLANT
SET IRRIG TUBING LAPAROSCOPIC (IRRIGATION / IRRIGATOR) ×4 IMPLANT
SET MICROPUNCTURE 5F STIFF (MISCELLANEOUS) ×1 IMPLANT
SHEATH PINNACLE 6F 10CM (SHEATH) ×1 IMPLANT
SHEATH PINNACLE 8F 10CM (SHEATH) ×2 IMPLANT
SOLUTION ANTI FOG 6CC (MISCELLANEOUS) ×4 IMPLANT
SUT BONE WAX W31G (SUTURE) ×4 IMPLANT
SUT E-PACK MINIMALLY INVASIVE (SUTURE) ×4 IMPLANT
SUT ETHIBOND (SUTURE) ×2 IMPLANT
SUT ETHIBOND 2 0 SH (SUTURE) ×1 IMPLANT
SUT ETHIBOND 2 0 V4 (SUTURE) IMPLANT
SUT ETHIBOND 2 0V4 GREEN (SUTURE) IMPLANT
SUT ETHIBOND 2-0 RB-1 WHT (SUTURE) ×2 IMPLANT
SUT ETHIBOND 4 0 TF (SUTURE) IMPLANT
SUT ETHIBOND 5 0 C 1 30 (SUTURE) IMPLANT
SUT ETHIBOND NAB MH 2-0 36IN (SUTURE) IMPLANT
SUT ETHIBOND X763 2 0 SH 1 (SUTURE) ×4 IMPLANT
SUT GORETEX 6.0 TH-9 30 IN (SUTURE) IMPLANT
SUT GORETEX CV 4 TH 22 36 (SUTURE) ×4 IMPLANT
SUT GORETEX CV-5THC-13 36IN (SUTURE) ×6 IMPLANT
SUT GORETEX CV4 TH-18 (SUTURE) ×8 IMPLANT
SUT GORETEX TH-18 36 INCH (SUTURE) IMPLANT
SUT PROLENE 3 0 SH DA (SUTURE) ×1 IMPLANT
SUT PROLENE 3 0 SH1 36 (SUTURE) ×19 IMPLANT
SUT PROLENE 4 0 RB 1 (SUTURE) ×2
SUT PROLENE 4-0 RB1 .5 CRCL 36 (SUTURE) IMPLANT
SUT PTFE CHORD X 24MM (SUTURE) ×1 IMPLANT
SUT SILK  1 MH (SUTURE) ×1
SUT SILK 1 MH (SUTURE) IMPLANT
SUT SILK 2 0 SH CR/8 (SUTURE) IMPLANT
SUT SILK 3 0 SH CR/8 (SUTURE) IMPLANT
SUT VIC AB 2-0 CTX 36 (SUTURE) IMPLANT
SUT VIC AB 3-0 SH 8-18 (SUTURE) IMPLANT
SUT VICRYL 2 TP 1 (SUTURE) IMPLANT
SYR 10ML LL (SYRINGE) ×4 IMPLANT
SYSTEM SAHARA CHEST DRAIN ATS (WOUND CARE) ×8 IMPLANT
TAPE CLOTH SURG 4X10 WHT LF (GAUZE/BANDAGES/DRESSINGS) ×1 IMPLANT
TAPE PAPER 2X10 WHT MICROPORE (GAUZE/BANDAGES/DRESSINGS) ×1 IMPLANT
TOWEL GREEN STERILE (TOWEL DISPOSABLE) ×4 IMPLANT
TOWEL GREEN STERILE FF (TOWEL DISPOSABLE) ×5 IMPLANT
TRAY FOLEY SLVR 16FR TEMP STAT (SET/KITS/TRAYS/PACK) ×4 IMPLANT
TROCAR XCEL BLADELESS 5X75MML (TROCAR) ×4 IMPLANT
TROCAR XCEL NON-BLD 11X100MML (ENDOMECHANICALS) ×8 IMPLANT
TUBE SUCT INTRACARD DLP 20F (MISCELLANEOUS) ×4 IMPLANT
TUNNELER SHEATH ON-Q 11GX8 DSP (PAIN MANAGEMENT) ×1 IMPLANT
UNDERPAD 30X30 (UNDERPADS AND DIAPERS) ×4 IMPLANT
WATER STERILE IRR 1000ML POUR (IV SOLUTION) ×8 IMPLANT
WIRE .035 3MM-J 145CM (WIRE) ×4 IMPLANT

## 2019-03-21 NOTE — Anesthesia Procedure Notes (Signed)
Arterial Line Insertion Start/End5/19/2020 6:40 AM, 03/21/2019 6:54 AM Performed by: Josephine Igo, CRNA, CRNA  Patient location: Pre-op. Preanesthetic checklist: patient identified, IV checked, site marked, risks and benefits discussed, surgical consent, monitors and equipment checked, pre-op evaluation and anesthesia consent Lidocaine 1% used for infiltration and patient sedated Right was placed Catheter size: 20 G Hand hygiene performed , maximum sterile barriers used  and Seldinger technique used Allen's test indicative of satisfactory collateral circulation Procedure performed without using ultrasound guided technique. Ultrasound Notes:anatomy identified, needle tip was noted to be adjacent to the nerve/plexus identified and no ultrasound evidence of intravascular and/or intraneural injection Following insertion, dressing applied and Biopatch. Post procedure assessment: normal  Patient tolerated the procedure well with no immediate complications.

## 2019-03-21 NOTE — Anesthesia Postprocedure Evaluation (Signed)
Anesthesia Post Note  Patient: [name]  Procedure(s) Performed: MINIMALLY INVASIVE MITRAL VALVE REPAIR (MVR) using Memo 4D ring size 36 (Right Chest) MINIMALLY INVASIVE MAZE PROCEDURE (N/A ) TRANSESOPHAGEAL ECHOCARDIOGRAM (TEE) (N/A ) Clipping Of Atrial Appendage using 1mm Atricure Pro2 Clip (Chest)     Patient location during evaluation: SICU Anesthesia Type: General Level of consciousness: sedated and patient remains intubated per anesthesia plan Pain management: pain level controlled Vital Signs Assessment: post-procedure vital signs reviewed and stable Respiratory status: patient remains intubated per anesthesia plan and patient on ventilator - see flowsheet for VS Cardiovascular status: stable Postop Assessment: no apparent nausea or vomiting Anesthetic complications: no    Last Vitals:  Vitals:   03/21/19 0601 03/21/19 1443  BP: 114/89 130/85  Pulse: 89 80  Resp: 16 14  Temp: 36.9 C   SpO2: 95% 98%    Last Pain:  Vitals:   03/21/19 0601  TempSrc: Oral  PainSc:                  Charelle Petrakis COKER

## 2019-03-21 NOTE — Op Note (Signed)
CARDIOTHORACIC SURGERY OPERATIVE NOTE  Date of Procedure:   03/21/2019  Preoperative Diagnosis:    Severe Mitral Regurgitation  Recurrent Persistent Atrial Fibrillation  Postoperative Diagnosis: Same  Procedure:    Minimally-Invasive Mitral Valve Repair  Complex valvuloplasty including triangular resection of posterior leaflet  Artificial Gore-tex neochord placement x4  Sorin Memo 4D Ring Annuloplasty (size 19mm, catalog #[id number], serial [id number])   Minimally-Invasive Maze Procedure  Complete bilateral atrial lesion set using cryothermy and bipolar radiofrequency ablation  Clipping of Left Atrial Appendage (Atricure Pro245 left atrial clip, size 45 mm)  Surgeon: Valentina Gu. Roxy Manns, MD  Assistant: John Giovanni, PA-C  Anesthesia: [name], MD  Operative Findings:  Fibroelastic deficiency type mxyomatous degenerative disease  Multiple ruptured primary chordae tendinae with flail segment (P2) of posterior leaflet  Type II mitral valve dysfunction with severe mitral regurgitation  Dilated left ventricle with mild left ventricular systolic dysfunction  Dilated right ventricle with low normal right ventricular function  Severe left atrial enlargement  No residual mitral regurgitation after successful valve repair                          BRIEF CLINICAL NOTE AND INDICATIONS FOR SURGERY  Patient is a 63 year old male with known history of mitral valve prolapse and mitral regurgitation, hypertension, and degenerative arthritis who was admitted to the hospital in rapid atrial fibrillation and atrial flutter with decompensated acute on chronic combined systolic and diastolic congestive heart failure and has been referred for surgical consultation to discuss treatment options for management of mitral regurgitation and atrial fibrillation.  Patient states that he was first noted to have a heart murmur on physical exam by his primary care physician more  than 10 years ago. He was diagnosed with mitral valve prolapse and has been followed intermittently by Dr. Gwenlyn Found ever since. Echocardiogram performed June 2019 revealed severe mitral valve prolapse with moderate mitral regurgitation. Left ventricular systolic function was normal with ejection fraction estimated 60 to 65%.  Several months ago the patient began to experience increased fatigue, exertional shortness of breath, anorexia, and progressive weight gain with worsening lower extremity edema. He was evaluated by his primary care physician and notably tachycardic and fluid overloaded. He was started on oral diuretics and referred to Dr. Alvester Chou who saw him December 02, 2018. At that time he was in rapid atrial fibrillation with heart rate 130. He was started on Eliquis and metoprolol dose increased. A follow-up echocardiogram was performed December 09, 2018 which revealed moderate to severe left ventricular systolic dysfunction with ejection fraction estimated only 35 to 40%. There was felt to be moderate mitral regurgitation although question of flail segment of the posterior leaflet. He returned to see Dr. Gwenlyn Found in follow-up and had gained another 20 pounds in weight and appearedshort of breathin acutely decompensated congestive heart failure. He was sent directly to the hospital for admission where he was found to be in atrial flutter. He responded well to intravenous diuresis and underwent TEE with DC cardioversion.TEE confirmed the presence of mitral valve prolapse with ruptured chordae tendinaeand a flail segment of the posterior leaflet. There is severe mitral regurgitation. Left ventricular ejection fraction was estimated 30 to 35%. There is moderately reduced right ventricular function. Severely dilated left and right atrium. No left atrial thrombus. The aortic valve was trileaflet and otherwise normal. The patient underwent cardioversion back into sinus rhythm. Cardiothoracic  surgical consultation was requested.  The patient has been seen in consultation  and counseled at length regarding the indications, risks and potential benefits of surgery.  All questions have been answered, and the patient provides full informed consent for the operation as described.    DETAILS OF THE OPERATIVE PROCEDURE  Preparation:  The patient is brought to the operating room on the above mentioned date and central monitoring was established by the anesthesia team including placement of Swan-Ganz catheter through the left internal jugular vein.  A radial arterial line is placed. The patient is placed in the supine position on the operating table.  Intravenous antibiotics are administered. General endotracheal anesthesia is induced uneventfully. The patient is initially intubated using a dual lumen endotracheal tube.  A Foley catheter is placed.  Baseline transesophageal echocardiogram was performed.  Findings were notable for dilated left ventricle with low normal left ventricular systolic function.  There was myxomatous degenerative disease with an obvious flail segment involving a portion of the middle scallop of the posterior leaflet.  There were ruptured primary chordae tendinae.  There was severe mitral regurgitation.  The left atrium was dilated.  The right ventricle was dilated but right ventricular function appeared normal.  There was trace tricuspid regurgitation.  A soft roll is placed behind the patient's left scapula and the neck gently extended and turned to the left.   The patient's right neck, chest, abdomen, both groins, and both lower extremities are prepared and draped in a sterile manner. A time out procedure is performed.   Percutaneous Vascular Access:  Percutaneous arterial and venous access were obtained on the right side.  Using ultrasound guidance the right common femoral vein was cannulated using the Seldinger technique and single Perclose vascular closure devise was  placed, after which time an 8 French sheath inserted.  The right common femoral artery was cannulated using a micropuncture wire and sheath.  A pair of Perclose vascular closure devices were placed at opposing 30 degree angles in the femoral artery, and a 8 French sheath inserted.  The right internal jugular vein was cannulated  using ultrasound guidance and an 8 French sheath inserted.     Surgical Approach:  A right miniature anterolateral thoracotomy incision is performed. The incision is placed just lateral to and superior to the right nipple. The pectoralis major muscle is retracted medially and completely preserved. The right pleural space is entered through the 4th intercostal space. A soft tissue retractor is placed.  Two 11 mm ports are placed through separate stab incisions inferiorly. The right pleural space is insufflated continuously with carbon dioxide gas through the posterior port during the remainder of the operation.  A pledgeted sutures placed through the dome of the right hemidiaphragm and retracted inferiorly to facilitate exposure.  A longitudinal incision is made in the pericardium 3 cm anterior to the phrenic nerve and silk traction sutures are placed on either side of the incision for exposure.   Extracorporeal Cardiopulmonary Bypass and Myocardial Protection:  The patient was heparinized systemically.  The right common femoral vein is cannulated through the venous sheath and a guidewire advanced into the right atrium using TEE guidance.  The femoral vein cannulated using a 22 Fr long femoral venous cannula.  The right common femoral artery is cannulated through the arterial sheath and a guidewire advanced into the descending thoracic aorta using TEE guidance.  Femoral artery is cannulated with a 18 French femoral arterial cannula.  The right internal jugular vein is cannulated through the venous sheath and a guidewire advanced into the right atrium.  The internal jugular vein is  cannulated using a 14 French pediatric femoral venous cannula.   Adequate heparinization is verified.   The entire pre-bypass portion of the operation was notable for stable hemodynamics.  Patient tolerated single lung ventilation only for brief periods of time because of a tendency for oxygen saturations to drop.  Cardiopulmonary bypass was begun.  Vacuum assist venous drainage is utilized. The incision in the pericardium is extended in both directions. Venous drainage and exposure are notably excellent.    Clipping of Left Atrial Appendage:  The left atrial appendage is obliterated using an Atricure left atrial appendage clip (Atriclip Pro245, size 45 mm).  The clip is applied under thoracoscopic visualization posterior to the aorta and pulmonary artery through the oblique sinus.  The clip was applied prior to application of the aortic crossclamp, with transesophageal echocardiographic confirmation that the clip satisfactorily obliterates the appendage.   Myocardial Protection:  A retrograde cardioplegia cannula is placed through the right atrium into the coronary sinus using transesophageal echocardiogram guidance.  An antegrade cardioplegia cannula is placed in the ascending aorta.  The patient is cooled to 28C systemic temperature.  The aortic cross clamp is applied and cardioplegia is delivered initially in an antegrade fashion through the aortic root using modified [place] cold blood cardioplegia ([place] blood cardioplegia protocol).   The initial cardioplegic arrest is rapid with early diastolic arrest.   Myocardial protection was felt to be excellent.   Maze Procedure (left atrial lesion set):  Following placement of the aortic crossclamp and the administration of the initial arresting dose of cardioplegia, a left atriotomy incision was performed through the interatrial groove and extended partially across the back wall of the left atrium after opening the oblique sinus inferiorly.   The mitral valve and floor of the left atrium are exposed using a self-retaining retractor.    The Atricure CryoICE nitrous oxide cryothermy system is utilized for all cryothermy ablation lesions.  The left atrial lesion set of the Cox cryomaze procedure is performed using 3 minute duration for all cryothermy lesions.  Initially a lesion is placed along the endocardial surface of the left atrium from the caudad apex of the atriotomy incision across the posterior wall of the left atrium onto the posterior mitral annulus.  A mirror image lesion along the epicardial surface is then performed with the probe posterior to the left atrium, crossing over the coronary sinus and extending up the epicardial surface of the posterolateral right atrium.  Two lesions are then performed to create a box isolating all of the pulmonary veins from the remainder of the left atrium.  The first lesion is placed from the cephalad apex of the atriotomy incision across the dome of the left atrium to just anterior to the left sided pulmonary veins.  The second lesion completes the box from the caudad apex of the atriotomy incision across the back wall of the left atrium to connect with the previous lesion just anterior to the left sided pulmonary veins.     Mitral Valve Repair:  The mitral valve was inspected and notable for fibroelastic deficiency type myxomatous degenerative disease.  There was an obvious flail segment involving a portion of the middle scallop (P2) of the posterior leaflet.  The segment involved was on the anterior side of midline.  The remainder of the valve looked reasonably normal with mild myxomatous change.  There was no calcification.  Interrupted 2-0 Ethibond horizontal mattress sutures are placed circumferentially around the entire  mitral valve annulus. The sutures will ultimately be utilized for ring annuloplasty, and at this juncture there are utilized to suspend the valve symmetrically.  The flail  segment of the posterior leaflet is repaired using a simple triangular resection.  Approximately 20% of the total surface area of P2 was resected.  The intervening vertical defect and P2 was closed using simple interrupted everting CV 5 Gore-Tex suture.  Artificial neochord placement was performed using Chord-X multi-strand CV-4 Goretex pre-measured loops.  The appropriate cord length was measured from corresponding normal length primary cords from the P1 segment of the posterior leaflet. The papillary muscle suture of the Chord-X multi-strand suture was placed through the head of the anterior papillary muscle in a horizontal mattress fashion and tied over Teflon felt pledgets. Two of the three pre-measured loops were then reimplanted into the free margin of the P2 segment of the posterior leaflet.  The remaining Chord-X suture was discarded.  The valve is tested with saline and appears reasonably competent even prior to ring annuloplasty.  The valve is sized to accept a 48mm annuloplasty ring based upon the distance between the left and right commissures, the height and the surface area of the anterior leaflet.  A Sorin Memo 4D annuloplasty ring (size 68mm, catalog #[id number], serial # [id number]) is implanted uneventfully.  All ring sutures were secured using a Cor-knot device.  The valve is again tested with saline and appears to be perfectly competent with a broad symmetrical line of coaptation of the anterior and posterior leaflet. There is no residual leak. Rewarming is begun.  The atriotomy was closed using a 2-layer closure of running 3-0 Prolene suture after placing a sump drain across the mitral valve to serve as a left ventricular vent.  One final dose of warm retrograde "reanimation dose" cardioplegia was administered retrograde through the coronary sinus catheter while all air was evacuated through the aortic root.  The aortic cross clamp was removed after a total cross clamp time of 112  minutes.   Maze Procedure (right atrial lesion set):  The retrograde cardioplegia cannula was removed and the small hole in the right atrium extended a short distance.  The AtriCure Synergy bipolar radiofrequency ablation clamp is utilized to create a series of linear lesions in the right atrium, each with one limb of the clamp along the endocardial surface and the other along the epicardial surface. The first lesion is placed from the posterior apex of the atriotomy incision and along the lateral wall of the right atrium to reach the lateral aspect of the superior vena cava, connecting with the cryothermy lesion placed previously from the coronary sinus to the epicardial surface of the posterolateral right atrium. A second lesion is placed in the opposite direction from the posterior apex of the atriotomy incision along the lateral wall to reach the lateral aspect of the inferior vena cava. A third lesion is placed from the midportion of the atriotomy incision extending at a right angle to reach the tip of the right atrial appendage. A fourth lesion is placed from the anterior apex of the atriotomy incision in an anterior and inferior direction to reach the acute margin of the heart. Finally, the cryotherapy probe is utilized to complete the right atrial lesion set by placing the probe along the endocardial surface of the right atrium from the anterior apex of the atriotomy incision to reach the tricuspid annulus at the 2:00 position. The atriotomy incision is closed with a 2 layer closure of running  4-0 Prolene suture.   Procedure Completion:  Epicardial pacing wires are fixed to the inferior wall of the right ventricule and to the right atrial appendage. The patient is rewarmed to 37C temperature. The left ventricular vent is removed.  The patient is ventilated and flow volumes turndown while the mitral valve repair is inspected using transesophageal echocardiogram. The valve repair appears intact with  no residual leak. The antegrade cardioplegia cannula is removed. The patient is weaned and disconnected from cardiopulmonary bypass.  The patient's rhythm at separation from bypass was AV paced.  The patient was initially weaned from bypass without any inotropic support. Total cardiopulmonary bypass time for the operation was 189 minutes.  Low-dose milrinone infusion was added because of mild to moderate global left and right ventricular systolic dysfunction.  Followup transesophageal echocardiogram performed after separation from bypass revealed a well-seated annuloplasty ring in the mitral position with a normal functioning mitral valve. There was no residual leak.  Mean transvalvular gradient across mitral valve was estimated less than 3 mmHg.  The femoral arterial and venous cannulas were removed and all Perclose sutures secured.  Manual pressure was maintained while Protamine was administered.  The right internal jugular cannula was removed and manual pressure held on the neck for 15 minutes.    Single lung ventilation was begun. The atriotomy closure was inspected for hemostasis.  A single lumen On-Q catheter was placed for postoperative analgesia.  The catheter was initially tunneled through the subcutaneous tissues to the posterior port incision and then passed through the port incision and tunneled into the subpleural space posteriorly to cover the second through the sixth intercostal nerve roots.  The catheter was flushed with 0.5% bupivacaine solution and ultimately connected to a continuous infusion pump.  The pericardial sac was drained using a 28 French Bard drain placed through the anterior port incision.  The right pleural space is irrigated with saline solution and inspected for hemostasis. The right pleural space was drained using a 28 French Bard drain placed through the posterior port incision. The miniature thoracotomy incision was closed in multiple layers in routine fashion. The right  groin incision was inspected for hemostasis and closed in multiple layers in routine fashion.  The post-bypass portion of the operation was notable for stable rhythm and hemodynamics.  No blood products were administered during the operation.   Disposition:  The patient tolerated the procedure well.  The patient was reintubated using a single lumen endotracheal tube and subsequently transported to the surgical intensive care unit in stable condition. There were no intraoperative complications. All sponge instrument and needle counts are verified correct at completion of the operation.    Valentina Gu. Roxy Manns MD 03/21/2019 2:08 PM

## 2019-03-21 NOTE — Procedures (Signed)
Extubation Procedure Note  Patient Details:   Name: [name] DOB: 09-13-1956 MRN: [id number]   Airway Documentation:    Vent end date: 03/21/19 Vent end time: 1830   Evaluation  O2 sats: stable throughout Complications: No apparent complications Patient did tolerate procedure well. Bilateral Breath Sounds: Clear, Diminished   Yes   Pt was extubated and placed onto 3 L [place] without complications. VC was 8L and pt was unable to perform NIF due to the lack of following direction. ABG results were within range for extubation. Pt is stable at this time. RT will continue to monitor.   Ronaldo Miyamoto 03/21/2019, 6:34 PM

## 2019-03-21 NOTE — Progress Notes (Signed)
      [address]       [place] [address]             [phone]      S/p Mitral repair, maze  Intubated, starting to wake up  BP 116/79   Pulse 80   Temp 97.9 F (36.6 C)   Resp 18   Ht 6\' 3"  (1.905 m)   Wt 109.3 kg   SpO2 100%   BMI 30.12 kg/m  37/18 CI 2.6 on milrinone and dopamine  Hct= 39, PLT 103K K= 4.6  Ct with minimal output  Doing well early postop  Remo Lipps C. Roxan Hockey, MD [facility] [phone]

## 2019-03-21 NOTE — Brief Op Note (Signed)
03/21/2019  2:06 PM  PATIENT:  [name]  63 y.o. male  PRE-OPERATIVE DIAGNOSIS:  MR AFIB  POST-OPERATIVE DIAGNOSIS:  MR AFIB  PROCEDURE:  Procedure(s): MINIMALLY INVASIVE MITRAL VALVE REPAIR (MVR) using Memo 4D ring size 36 (Right) MINIMALLY INVASIVE MAZE PROCEDURE (N/A) TRANSESOPHAGEAL ECHOCARDIOGRAM (TEE) (N/A) Clipping Of Atrial Appendage using 88mm Atricure Pro2 Clip  SURGEON:  Surgeon(s) and Role:    * Rexene Alberts, MD - Primary  PHYSICIAN ASSISTANT: WAYNE GOLD PA-C  ANESTHESIA:   general  EBL:  1000 mL   BLOOD ADMINISTERED:none  DRAINS: ROUTINE PLEURAL DRAINS   LOCAL MEDICATIONS USED:  NONE  SPECIMEN:  Source of Specimen:  POSTERIOR LEAFLET  DISPOSITION OF SPECIMEN:  PATHOLOGY  COUNTS:  YES  TOURNIQUET:  * No tourniquets in log *  DICTATION: .Dragon Dictation  PLAN OF CARE: Admit to inpatient   PATIENT DISPOSITION:  ICU - intubated and hemodynamically stable.   Delay start of Pharmacological VTE agent (>24hrs) due to surgical blood loss or risk of bleeding: yes  COMPLICATIONS:  NO KNOWN

## 2019-03-21 NOTE — Interval H&P Note (Signed)
History and Physical Interval Note:  03/21/2019 6:08 AM  [name]  has presented today for surgery, with the diagnosis of MR AFIB.  The various methods of treatment have been discussed with the patient and family. After consideration of risks, benefits and other options for treatment, the patient has consented to  Procedure(s) with comments: MINIMALLY INVASIVE MITRAL VALVE REPAIR (MVR) (Right) - GLUTARALDEHYDE MINIMALLY INVASIVE MAZE PROCEDURE (N/A) TRANSESOPHAGEAL ECHOCARDIOGRAM (TEE) (N/A) as a surgical intervention.  The patient's history has been reviewed, patient examined, no change in status, stable for surgery.  I have reviewed the patient's chart and labs.  Questions were answered to the patient's satisfaction.     Rexene Alberts

## 2019-03-21 NOTE — Anesthesia Procedure Notes (Addendum)
Procedure Name: Intubation Date/Time: 03/21/2019 8:03 AM Performed by: Cleda Daub, CRNA Pre-anesthesia Checklist: Emergency Drugs available, Suction available, Patient identified and Patient being monitored Patient Re-evaluated:Patient Re-evaluated prior to induction Oxygen Delivery Method: Circle system utilized Preoxygenation: Pre-oxygenation with 100% oxygen Induction Type: IV induction Ventilation: Two handed mask ventilation required, Oral airway inserted - appropriate to patient size and Mask ventilation throughout procedure Laryngoscope Size: Glidescope and 4 Grade View: Grade I Endobronchial tube: Left, Double lumen EBT and EBT position confirmed by fiberoptic bronchoscope and 37 Fr Number of attempts: 1 Airway Equipment and Method: Stylet and Video-laryngoscopy Placement Confirmation: ETT inserted through vocal cords under direct vision,  positive ETCO2 and breath sounds checked- equal and bilateral Tube secured with: Tape Dental Injury: Teeth and Oropharynx as per pre-operative assessment

## 2019-03-21 NOTE — Transfer of Care (Signed)
Immediate Anesthesia Transfer of Care Note  Patient: [name]  Procedure(s) Performed: MINIMALLY INVASIVE MITRAL VALVE REPAIR (MVR) using Memo 4D ring size 36 (Right Chest) MINIMALLY INVASIVE MAZE PROCEDURE (N/A ) TRANSESOPHAGEAL ECHOCARDIOGRAM (TEE) (N/A ) Clipping Of Atrial Appendage using 57mm Atricure Pro2 Clip (Chest)  Patient Location: ICU  Anesthesia Type:General  Level of Consciousness: Patient remains intubated per anesthesia plan  Airway & Oxygen Therapy: Patient remains intubated per anesthesia plan and Patient placed on Ventilator (see vital sign flow sheet for setting)  Post-op Assessment: Report given to RN and Post -op Vital signs reviewed and stable  Post vital signs: Reviewed and stable  Last Vitals:  Vitals Value Taken Time  BP 117/92 03/21/2019  2:59 PM  Temp 36.2 C 03/21/2019  2:59 PM  Pulse 78 03/21/2019  2:59 PM  Resp 18 03/21/2019  2:59 PM  SpO2 97 % 03/21/2019  2:59 PM  Vitals shown include unvalidated device data.  Last Pain:  Vitals:   03/21/19 0601  TempSrc: Oral  PainSc:       Patients Stated Pain Goal: 3 (88/91/69 4503)  Complications: No apparent anesthesia complications

## 2019-03-21 NOTE — Anesthesia Procedure Notes (Signed)
Central Venous Catheter Insertion Performed by: [name], MD, anesthesiologist Start/End5/19/2020 7:00 AM, 03/21/2019 7:10 AM Patient location: Pre-op. Preanesthetic checklist: patient identified, IV checked, site marked, risks and benefits discussed, surgical consent, monitors and equipment checked, pre-op evaluation, timeout performed and anesthesia consent Lidocaine 1% used for infiltration and patient sedated Hand hygiene performed  and maximum sterile barriers used  Catheter size: 8.5 Fr Sheath introducer Procedure performed using ultrasound guided technique. Ultrasound Notes:anatomy identified, needle tip was noted to be adjacent to the nerve/plexus identified, no ultrasound evidence of intravascular and/or intraneural injection and image(s) printed for medical record Attempts: 1 Following insertion, line sutured and dressing applied. Post procedure assessment: blood return through all ports, free fluid flow and no air  Patient tolerated the procedure well with no immediate complications.

## 2019-03-22 ENCOUNTER — Encounter (HOSPITAL_COMMUNITY): Payer: Self-pay | Admitting: Thoracic Surgery (Cardiothoracic Vascular Surgery)

## 2019-03-22 ENCOUNTER — Inpatient Hospital Stay (HOSPITAL_COMMUNITY): Payer: BC Managed Care – PPO

## 2019-03-22 ENCOUNTER — Encounter (HOSPITAL_COMMUNITY)
Admission: RE | Disposition: A | Discharge status: Home / Self Care | Payer: Self-pay | Source: Home / Self Care | Attending: Thoracic Surgery (Cardiothoracic Vascular Surgery)

## 2019-03-22 DIAGNOSIS — R001 Bradycardia, unspecified: Secondary | ICD-10-CM

## 2019-03-22 HISTORY — PX: TEMPORARY PACEMAKER: CATH118268

## 2019-03-22 LAB — GLUCOSE, CAPILLARY
Glucose-Capillary: 116 mg/dL — ABNORMAL HIGH (ref 70–99)
Glucose-Capillary: 115 mg/dL — ABNORMAL HIGH (ref 70–99)
Glucose-Capillary: 111 mg/dL — ABNORMAL HIGH (ref 70–99)
Glucose-Capillary: 125 mg/dL — ABNORMAL HIGH (ref 70–99)
Glucose-Capillary: 108 mg/dL — ABNORMAL HIGH (ref 70–99)
Glucose-Capillary: 89 mg/dL (ref 70–99)
Glucose-Capillary: 117 mg/dL — ABNORMAL HIGH (ref 70–99)
Glucose-Capillary: 135 mg/dL — ABNORMAL HIGH (ref 70–99)
Glucose-Capillary: 159 mg/dL — ABNORMAL HIGH (ref 70–99)
Glucose-Capillary: 161 mg/dL — ABNORMAL HIGH (ref 70–99)

## 2019-03-22 LAB — MAGNESIUM
Magnesium: 3.1 mg/dL — ABNORMAL HIGH (ref 1.7–2.4)
Magnesium: 2.8 mg/dL — ABNORMAL HIGH (ref 1.7–2.4)

## 2019-03-22 LAB — BASIC METABOLIC PANEL
Anion gap: 8 (ref 5–15)
Anion gap: 7 (ref 5–15)
BUN: 18 mg/dL (ref 8–23)
BUN: 21 mg/dL (ref 8–23)
CO2: 23 mmol/L (ref 22–32)
CO2: 24 mmol/L (ref 22–32)
Calcium: 7.7 mg/dL — ABNORMAL LOW (ref 8.9–10.3)
Calcium: 8.2 mg/dL — ABNORMAL LOW (ref 8.9–10.3)
Chloride: 110 mmol/L (ref 98–111)
Chloride: 104 mmol/L (ref 98–111)
Creatinine, Ser: 0.8 mg/dL (ref 0.61–1.24)
Creatinine, Ser: 1.09 mg/dL (ref 0.61–1.24)
GFR calc Af Amer: >60 mL/min (ref >60)
GFR calc Af Amer: >60 mL/min (ref >60)
GFR calc non Af Amer: >60 mL/min (ref >60)
GFR calc non Af Amer: >60 mL/min (ref >60)
Glucose, Bld: 116 mg/dL — ABNORMAL HIGH (ref 70–99)
Glucose, Bld: 140 mg/dL — ABNORMAL HIGH (ref 70–99)
Potassium: 3.8 mmol/L (ref 3.5–5.1)
Potassium: 4.2 mmol/L (ref 3.5–5.1)
Sodium: 141 mmol/L (ref 135–145)
Sodium: 135 mmol/L (ref 135–145)

## 2019-03-22 LAB — CBC
HCT: 31.6 % — ABNORMAL LOW (ref 39.0–52.0)
HCT: 29.8 % — ABNORMAL LOW (ref 39.0–52.0)
Hemoglobin: 10.1 g/dL — ABNORMAL LOW (ref 13.0–17.0)
Hemoglobin: 9.4 g/dL — ABNORMAL LOW (ref 13.0–17.0)
MCH: 31.2 pg (ref 26.0–34.0)
MCH: 31.5 pg (ref 26.0–34.0)
MCHC: 32 g/dL (ref 30.0–36.0)
MCHC: 31.5 g/dL (ref 30.0–36.0)
MCV: 97.5 fL (ref 80.0–100.0)
MCV: 100 fL (ref 80.0–100.0)
Platelets: 82 10*3/uL — ABNORMAL LOW (ref 150–400)
Platelets: 59 10*3/uL — ABNORMAL LOW (ref 150–400)
RBC: 3.24 MIL/uL — ABNORMAL LOW (ref 4.22–5.81)
RBC: 2.98 MIL/uL — ABNORMAL LOW (ref 4.22–5.81)
RDW: 17.8 % — ABNORMAL HIGH (ref 11.5–15.5)
RDW: 17.7 % — ABNORMAL HIGH (ref 11.5–15.5)
WBC: 10.4 10*3/uL (ref 4.0–10.5)
WBC: 8.9 10*3/uL (ref 4.0–10.5)
nRBC: 0 % (ref 0.0–0.2)
nRBC: 0 % (ref 0.0–0.2)

## 2019-03-22 LAB — COOXEMETRY PANEL
Carboxyhemoglobin: 1.9 % — ABNORMAL HIGH (ref 0.5–1.5)
Methemoglobin: 1.9 % — ABNORMAL HIGH (ref 0.0–1.5)
O2 Saturation: 74 %
Total hemoglobin: 10.4 g/dL — ABNORMAL LOW (ref 12.0–16.0)

## 2019-03-22 SURGERY — TEMPORARY PACEMAKER
Anesthesia: LOCAL

## 2019-03-22 MED ORDER — LIDOCAINE HCL (PF) 1 % IJ SOLN
INTRAMUSCULAR | Status: AC
  Filled 2019-03-22: qty 30

## 2019-03-22 MED ORDER — DOPAMINE-DEXTROSE 3.2-5 MG/ML-% IV SOLN
INTRAVENOUS | Status: DC | PRN
  Administered 2019-03-22: 2.5 ug/kg/min via INTRAVENOUS

## 2019-03-22 MED ORDER — ALBUMIN HUMAN 5 % IV SOLN
12.5000 g | Freq: Once | INTRAVENOUS | Status: AC
  Administered 2019-03-22: 12.5 g via INTRAVENOUS
  Filled 2019-03-22: qty 250

## 2019-03-22 MED ORDER — NONFORMULARY OR COMPOUNDED ITEM
1.0000 "application " | Freq: Three times a day (TID) | Status: DC
  Administered 2019-03-22 – 2019-03-27 (×13): 1 via RECTAL

## 2019-03-22 MED ORDER — MIDAZOLAM HCL 2 MG/2ML IJ SOLN
INTRAMUSCULAR | Status: AC
  Filled 2019-03-22: qty 2

## 2019-03-22 MED ORDER — MIDAZOLAM HCL 2 MG/2ML IJ SOLN
INTRAMUSCULAR | Status: DC | PRN
  Administered 2019-03-22: 0.5 mg via INTRAVENOUS

## 2019-03-22 MED ORDER — DOPAMINE-DEXTROSE 3.2-5 MG/ML-% IV SOLN
0.0000 ug/kg/min | INTRAVENOUS | Status: DC
  Administered 2019-03-22: 5 ug/kg/min via INTRAVENOUS
  Filled 2019-03-22: qty 250

## 2019-03-22 MED ORDER — ALPRAZOLAM 0.5 MG PO TABS
0.5000 mg | ORAL_TABLET | Freq: Every evening | ORAL | Status: DC | PRN
  Administered 2019-03-24 – 2019-03-26 (×3): 0.5 mg via ORAL
  Filled 2019-03-22 (×3): qty 1

## 2019-03-22 MED ORDER — HEPARIN (PORCINE) IN NACL 1000-0.9 UT/500ML-% IV SOLN
INTRAVENOUS | Status: AC
  Filled 2019-03-22: qty 500

## 2019-03-22 MED ORDER — HEPARIN (PORCINE) IN NACL 1000-0.9 UT/500ML-% IV SOLN
INTRAVENOUS | Status: DC | PRN
  Administered 2019-03-22: 500 mL

## 2019-03-22 MED ORDER — LIDOCAINE (ANORECTAL) 5 % EX CREA
1.0000 "application " | TOPICAL_CREAM | Freq: Two times a day (BID) | CUTANEOUS | Status: DC | PRN
  Administered 2019-03-23: 1 via RECTAL

## 2019-03-22 MED ORDER — INSULIN ASPART 100 UNIT/ML ~~LOC~~ SOLN
0.0000 [IU] | SUBCUTANEOUS | Status: DC
  Administered 2019-03-22: 2 [IU] via SUBCUTANEOUS
  Administered 2019-03-22: 4 [IU] via SUBCUTANEOUS
  Administered 2019-03-23 (×2): 2 [IU] via SUBCUTANEOUS

## 2019-03-22 MED ORDER — WARFARIN - PHYSICIAN DOSING INPATIENT: Freq: Every day | Status: DC

## 2019-03-22 MED ORDER — WARFARIN SODIUM 2.5 MG PO TABS
2.5000 mg | ORAL_TABLET | Freq: Every day | ORAL | Status: DC
  Administered 2019-03-22 – 2019-03-23 (×2): 2.5 mg via ORAL
  Filled 2019-03-22 (×2): qty 1

## 2019-03-22 MED ORDER — ENOXAPARIN SODIUM 30 MG/0.3ML ~~LOC~~ SOLN: 30.0000 mg | Freq: Every day | SUBCUTANEOUS | Status: DC

## 2019-03-22 MED ORDER — KETOROLAC TROMETHAMINE 15 MG/ML IJ SOLN
15.0000 mg | Freq: Four times a day (QID) | INTRAMUSCULAR | Status: AC
  Administered 2019-03-22 – 2019-03-23 (×4): 15 mg via INTRAVENOUS
  Filled 2019-03-22 (×4): qty 1

## 2019-03-22 MED ORDER — FENTANYL CITRATE (PF) 100 MCG/2ML IJ SOLN
INTRAMUSCULAR | Status: AC
  Filled 2019-03-22: qty 2

## 2019-03-22 MED ORDER — AMIODARONE HCL 200 MG PO TABS: 200.0000 mg | ORAL_TABLET | Freq: Every day | ORAL | Status: DC

## 2019-03-22 MED ORDER — CALCIUM CHLORIDE 10 % IV SOLN
1.0000 g | Freq: Once | INTRAVENOUS | Status: AC
  Administered 2019-03-22: 1 g via INTRAVENOUS

## 2019-03-22 MED ORDER — NOREPINEPHRINE 4 MG/250ML-% IV SOLN
0.0000 ug/min | INTRAVENOUS | Status: DC
  Administered 2019-03-22: 10 ug/min via INTRAVENOUS

## 2019-03-22 MED ORDER — FUROSEMIDE 10 MG/ML IJ SOLN
40.0000 mg | Freq: Once | INTRAMUSCULAR | Status: AC
  Administered 2019-03-22: 40 mg via INTRAVENOUS
  Filled 2019-03-22: qty 4

## 2019-03-22 MED ORDER — FENTANYL CITRATE (PF) 100 MCG/2ML IJ SOLN
INTRAMUSCULAR | Status: DC | PRN
  Administered 2019-03-22: 25 ug via INTRAVENOUS

## 2019-03-22 MED ORDER — LIDOCAINE HCL (PF) 1 % IJ SOLN
INTRAMUSCULAR | Status: DC | PRN
  Administered 2019-03-22: 15 mg

## 2019-03-22 SURGICAL SUPPLY — 7 items
CABLE ADAPT CONN TEMP 6FT (ADAPTER) ×2 IMPLANT
CATH S G BIP PACING (CATHETERS) ×2 IMPLANT
HOVERMATT SINGLE USE (MISCELLANEOUS) ×2 IMPLANT
PACK CARDIAC CATHETERIZATION (CUSTOM PROCEDURE TRAY) ×2 IMPLANT
SHEATH PINNACLE 6F 10CM (SHEATH) ×2 IMPLANT
SHEATH PROBE COVER 6X72 (BAG) ×2 IMPLANT
SLEEVE REPOSITIONING LENGTH 30 (MISCELLANEOUS) ×2 IMPLANT

## 2019-03-22 NOTE — Progress Notes (Signed)
Patient ID: [name], male   DOB: 1956-07-27, 63 y.o.   MRN: [id number] [facility] Evening Rounds:  He has had a stable day and pacer was off most of the day. ECG this am showed RBBB rhythm. He went to bathroom this pm to move bowels and developed complete heart block with rate in the 30's, felt dizzy, diaphoretic. We tried to pace him externally but could not capture atrial or ventricular leads at max output. I changed pacer and leads which did not make a difference. Dopamine started at 5 mcg and increased to 7.5 with increased HR to 50-70's but still has periods of no complexes for several seconds and complex morphology is changing back and forth. He has apparently had high thresholds on pacer wires postop. I think he needs a temporary transvenous pacer for safety since the external wires are not working until we are sure he does not need a PPM. Dr. [name] is going to do as soon as team arrives. Pt is currently stable on dopamine. Discussed with patient and he is in agreement. Discussed with his wife by phone.

## 2019-03-22 NOTE — Interval H&P Note (Signed)
History and Physical Interval Note:  03/22/2019 6:11 PM Developed sudden bradycardia and failure of epicardial leads to capture. Emergency temporary transveous pacer requested by Dr. Cyndia Bent.Cath Lab Visit (complete for each Cath Lab visit)  Clinical Evaluation Leading to the Procedure:   ACS: No.  Non-ACS:    Anginal Classification: CCS Ewing  Anti-ischemic medical therapy: No Therapy  Non-Invasive Test Results: No non-invasive testing performed  Prior CABG: No previous CABG       [name]  has presented today for surgery, with the diagnosis of bradycardia.  The various methods of treatment have been discussed with the patient and family. After consideration of risks, benefits and other options for treatment, the patient has consented to  Procedure(s): TEMPORARY PACEMAKER (N/A) as a surgical intervention.  The patient's history has been reviewed, patient examined, no change in status, stable for surgery.  I have reviewed the patient's chart and labs.  Questions were answered to the patient's satisfaction.     [name]

## 2019-03-22 NOTE — CV Procedure (Signed)
   Temporary transvenous pacing wire placed via left femoral vein using real-time vascular ultrasound for guidance.  Temporary wire positioned in the RV apex with threshold of less than 1 mA.  Pacer set at 70 bpm, MA 4.  Positioning was very difficult and in any position seem to generate some ventricular ectopy.  Possibly related to dopamine.  Dopamine infusion rate decreased by 50% with goal to discontinue later this evening.

## 2019-03-22 NOTE — H&P (View-Only) (Signed)
T[facility] DAILY ICU PROGRESS NOTE                   [address]            [place] [address]          [phone]   1 Day Post-Op Procedure(s) (LRB): MINIMALLY INVASIVE MITRAL VALVE REPAIR (MVR) using Memo 4D ring size 36 (Right) MINIMALLY INVASIVE MAZE PROCEDURE (N/A) TRANSESOPHAGEAL ECHOCARDIOGRAM (TEE) (N/A) Clipping Of Atrial Appendage using 14m Atricure Pro2 Clip  Total Length of Stay:  LOS: 1 day   Subjective: Some pain issues, improving  Objective: Vital signs in last 24 hours: Temp:  [97.2 F (36.2 C)-99.7 F (37.6 C)] 98.6 F (37 C) (05/20 0715) Pulse Rate:  [67-86] 79 (05/20 0715) Cardiac Rhythm: Atrial paced (05/19 2000) Resp:  [0-25] 18 (05/20 0715) BP: (97-137)/(58-95) 99/65 (05/20 0700) SpO2:  [95 %-100 %] 97 % (05/20 0715) Arterial Line BP: (78-148)/(36-85) 91/40 (05/20 0715) FiO2 (%):  [40 %-50 %] 40 % (05/19 1753) Weight:  [119.3 kg] 119.3 kg (05/20 0500)  Filed Weights   03/21/19 0550 03/22/19 0500  Weight: 109.3 kg 119.3 kg    Weight change: 9.983 kg   Hemodynamic parameters for last 24 hours: PAP: (26-53)/(9-31) 31/12 CO:  [3.4 L/min-10.2 L/min] 10.2 L/min CI:  [1.4 L/min/m2-4.3 L/min/m2] 4.3 L/min/m2  Intake/Output from previous day: 05/19 0701 - 05/20 0700 In: 7762.3 [P.O.:1200; I.V.:4614.4; Blood:400; IV Piggyback:1547.9] Out: 58502[Urine:3920; Blood:1000; Chest Tube:858]  Intake/Output this shift: No intake/output data recorded.  Current Meds: Scheduled Meds: . acetaminophen  1,000 mg Oral Q6H  . aspirin EC  325 mg Oral Daily  . bisacodyl  10 mg Oral Daily   Or  . bisacodyl  10 mg Rectal Daily  . chlorhexidine gluconate (MEDLINE KIT)  15 mL Mouth Rinse BID  . Chlorhexidine Gluconate Cloth  6 each Topical Daily  . docusate sodium  200 mg Oral Daily  . [START ON 03/23/2019] enoxaparin (LOVENOX) injection  30 mg Subcutaneous QHS  . insulin aspart  0-24 Units Subcutaneous Q4H  . mouth rinse  15 mL Mouth Rinse BID  .  [START ON 03/23/2019] pantoprazole  40 mg Oral Daily  . sodium chloride flush  3 mL Intravenous Q12H  . warfarin  2.5 mg Oral q1800  . Warfarin - Physician Dosing Inpatient   Does not apply q1800   Continuous Infusions: . sodium chloride    . cefUROXime (ZINACEF)  IV Stopped (03/22/19 07741  . DOPamine 0 mcg/kg/min (03/22/19 02878  . lactated ringers Stopped (03/21/19 1524)  . lactated ringers 20 mL/hr at 03/22/19 0700  . milrinone 0.125 mcg/kg/min (03/22/19 0700)  . norepinephrine (LEVOPHED) Adult infusion 10 mcg/min (03/21/19 1445)  . phenylephrine (NEO-SYNEPHRINE) Adult infusion Stopped (03/22/19 0406)   PRN Meds:.metoprolol tartrate, morphine injection, ondansetron (ZOFRAN) IV, oxyCODONE, sodium chloride flush, traMADol  General appearance: alert, cooperative and no distress Neurologic: intact Heart: regular rate and rhythm and no murmur or rub Lungs: dim right> left lower fields Abdomen: soft, nontender Extremities: min edema Wound: dressings CDI  Lab Results: CBC: Recent Labs    03/21/19 2045 03/21/19 2052 03/22/19 0404  WBC 11.5*  --  10.4  HGB 11.6* 11.9* 10.1*  HCT 36.3* 35.0* 31.6*  PLT 93*  --  82*   BMET:  Recent Labs    03/21/19 2052 03/22/19 0404  NA 142 141  K 4.1 3.8  CL 110 110  CO2  --  23  GLUCOSE 121*  116*  BUN 22 18  CREATININE 0.80 0.80  CALCIUM  --  7.7*    CMET: Lab Results  Component Value Date   WBC 10.4 03/22/2019   HGB 10.1 (L) 03/22/2019   HCT 31.6 (L) 03/22/2019   PLT 82 (L) 03/22/2019   GLUCOSE 116 (H) 03/22/2019   ALT 32 03/16/2019   AST 26 03/16/2019   NA 141 03/22/2019   K 3.8 03/22/2019   CL 110 03/22/2019   CREATININE 0.80 03/22/2019   BUN 18 03/22/2019   CO2 23 03/22/2019   TSH 4.420 03/15/2019   INR 1.5 (H) 03/21/2019   HGBA1C 5.2 03/16/2019      PT/INR:  Recent Labs    03/21/19 1504  LABPROT 18.0*  INR 1.5*   Radiology: Dg Chest Port 1 View  Result Date: 03/21/2019 CLINICAL DATA:  Atelectasis  EXAM: PORTABLE CHEST 1 VIEW COMPARISON:  03/16/2019, CT 01/03/2019 FINDINGS: Interval intubation, tip of the endotracheal tube is 3.5 cm superior to the carina. Esophageal tube side port projects over distal esophagus. Left IJ Swan-Ganz catheter tip projects over right descending pulmonary artery. Interval postsurgical changes of the mediastinum with mitral valve repair. Placement of right-sided chest drainage catheters without well-defined pneumothorax. Low lung volumes with elevated right diaphragm. Opacity at the right base and right mid lung. Enlarged cardiomediastinal silhouette. IMPRESSION: 1. Placement of support lines and tubes as above. Esophageal tube side port at the level of distal esophagus, suggest further advancement for more optimal positioning 2. Low lung volumes with elevation of right diaphragm. Airspace disease at the right base with volume loss at the right thorax suggests atelectasis. More focal opacity in the right mid lung, could be due to focus of atelectasis or possibly hemorrhage. 3. Enlarged cardiomediastinal silhouette Electronically Signed   By: Donavan Foil M.D.   On: 03/21/2019 15:33     Assessment/Plan: S/P Procedure(s) (LRB): MINIMALLY INVASIVE MITRAL VALVE REPAIR (MVR) using Memo 4D ring size 36 (Right) MINIMALLY INVASIVE MAZE PROCEDURE (N/A) TRANSESOPHAGEAL ECHOCARDIOGRAM (TEE) (N/A) Clipping Of Atrial Appendage using 13m Atricure Pro2 Clip   1 doing well POD#1 2 hemodyn stable on low dose milrinone, norepi. Excellent CoOx, 74, PAP 18, excellent cardiac Indecies- wean as able . SBP is pretty variable but mostly 90-100's most recently. Tmax 99.7 3 sats excellent on RA, push pulm toilet/IS with ATX on CXR 4 moderate chest tube drainage- continue tubes 5 ABL anemia- trend over time, not at transfusion threshold 6 good UOP- cont spontaneous diuresis for now, may need lasix at some point Normal renal fxn/GFR 7 Thrombocytopenia- reactive, monitor trends 8 BS well  controlled, preop HgA1C 5.2 , stop insulin soon 9 start coumadin, lovenox 10 d/c Sganz 11 No leukocytosis 12 routine advancement of activity WJohn GiovanniPA-C 03/22/2019 7:47 AM  Pager [phone]   I have seen and examined the patient and agree with the assessment and plan as outlined.  Doing well POD1.  Maintaining NSR w/ stable hemodynamics on low dose milrinone.  EKG w/ wide QRS and RBBB similar to preop EKG performed last March when patient was most recently in NSR.  Mobilize.  D/C lines.  Wean milrinone slowly.  Start Coumadin.  CRexene Alberts MD 03/22/2019 8:26 AM

## 2019-03-22 NOTE — Progress Notes (Signed)
Anesthesiology Follow-up:  Awake and alert, neuro intact, in good spirits, breathing unlabored, having incisional soreness.    On milrinone 0.125 mcg/kg/min and Norepi at 10 mcg/min for hemodynamic support  VS: T- 37 BP- 97/58 HR- 80 (Junctional versus sinus rhythm) RR- 15 O2 Sat- 95% PA 32/13 CO/CI- 10.2/4.3   K-3.8 BUN/Cr.- 18/0.8 glucose- 102 H/H- 10.1/31.6 Platelets- 82,000  Extubated 3.5 hours post-op.   63 year old male one day S/P MV repair and Maze procedures for sever MR and persistent Afib. Stable post-op course.  [name]

## 2019-03-22 NOTE — Progress Notes (Addendum)
T[facility] DAILY ICU PROGRESS NOTE                   [address]            [place] [address]          [phone]   1 Day Post-Op Procedure(s) (LRB): MINIMALLY INVASIVE MITRAL VALVE REPAIR (MVR) using Memo 4D ring size 36 (Right) MINIMALLY INVASIVE MAZE PROCEDURE (N/A) TRANSESOPHAGEAL ECHOCARDIOGRAM (TEE) (N/A) Clipping Of Atrial Appendage using 45mm Atricure Pro2 Clip  Total Length of Stay:  LOS: 1 day   Subjective: Some pain issues, improving  Objective: Vital signs in last 24 hours: Temp:  [97.2 F (36.2 C)-99.7 F (37.6 C)] 98.6 F (37 C) (05/20 0715) Pulse Rate:  [67-86] 79 (05/20 0715) Cardiac Rhythm: Atrial paced (05/19 2000) Resp:  [0-25] 18 (05/20 0715) BP: (97-137)/(58-95) 99/65 (05/20 0700) SpO2:  [95 %-100 %] 97 % (05/20 0715) Arterial Line BP: (78-148)/(36-85) 91/40 (05/20 0715) FiO2 (%):  [40 %-50 %] 40 % (05/19 1753) Weight:  [119.3 kg] 119.3 kg (05/20 0500)  Filed Weights   03/21/19 0550 03/22/19 0500  Weight: 109.3 kg 119.3 kg    Weight change: 9.983 kg   Hemodynamic parameters for last 24 hours: PAP: (26-53)/(9-31) 31/12 CO:  [3.4 L/min-10.2 L/min] 10.2 L/min CI:  [1.4 L/min/m2-4.3 L/min/m2] 4.3 L/min/m2  Intake/Output from previous day: 05/19 0701 - 05/20 0700 In: 7762.3 [P.O.:1200; I.V.:4614.4; Blood:400; IV Piggyback:1547.9] Out: 5778 [Urine:3920; Blood:1000; Chest Tube:858]  Intake/Output this shift: No intake/output data recorded.  Current Meds: Scheduled Meds: . acetaminophen  1,000 mg Oral Q6H  . aspirin EC  325 mg Oral Daily  . bisacodyl  10 mg Oral Daily   Or  . bisacodyl  10 mg Rectal Daily  . chlorhexidine gluconate (MEDLINE KIT)  15 mL Mouth Rinse BID  . Chlorhexidine Gluconate Cloth  6 each Topical Daily  . docusate sodium  200 mg Oral Daily  . [START ON 03/23/2019] enoxaparin (LOVENOX) injection  30 mg Subcutaneous QHS  . insulin aspart  0-24 Units Subcutaneous Q4H  . mouth rinse  15 mL Mouth Rinse BID  .  [START ON 03/23/2019] pantoprazole  40 mg Oral Daily  . sodium chloride flush  3 mL Intravenous Q12H  . warfarin  2.5 mg Oral q1800  . Warfarin - Physician Dosing Inpatient   Does not apply q1800   Continuous Infusions: . sodium chloride    . cefUROXime (ZINACEF)  IV Stopped (03/22/19 0638)  . DOPamine 0 mcg/kg/min (03/22/19 0619)  . lactated ringers Stopped (03/21/19 1524)  . lactated ringers 20 mL/hr at 03/22/19 0700  . milrinone 0.125 mcg/kg/min (03/22/19 0700)  . norepinephrine (LEVOPHED) Adult infusion 10 mcg/min (03/21/19 1445)  . phenylephrine (NEO-SYNEPHRINE) Adult infusion Stopped (03/22/19 0406)   PRN Meds:.metoprolol tartrate, morphine injection, ondansetron (ZOFRAN) IV, oxyCODONE, sodium chloride flush, traMADol  General appearance: alert, cooperative and no distress Neurologic: intact Heart: regular rate and rhythm and no murmur or rub Lungs: dim right> left lower fields Abdomen: soft, nontender Extremities: min edema Wound: dressings CDI  Lab Results: CBC: Recent Labs    03/21/19 2045 03/21/19 2052 03/22/19 0404  WBC 11.5*  --  10.4  HGB 11.6* 11.9* 10.1*  HCT 36.3* 35.0* 31.6*  PLT 93*  --  82*   BMET:  Recent Labs    03/21/19 2052 03/22/19 0404  NA 142 141  K 4.1 3.8  CL 110 110  CO2  --  23  GLUCOSE 121*   116*  BUN 22 18  CREATININE 0.80 0.80  CALCIUM  --  7.7*    CMET: Lab Results  Component Value Date   WBC 10.4 03/22/2019   HGB 10.1 (L) 03/22/2019   HCT 31.6 (L) 03/22/2019   PLT 82 (L) 03/22/2019   GLUCOSE 116 (H) 03/22/2019   ALT 32 03/16/2019   AST 26 03/16/2019   NA 141 03/22/2019   K 3.8 03/22/2019   CL 110 03/22/2019   CREATININE 0.80 03/22/2019   BUN 18 03/22/2019   CO2 23 03/22/2019   TSH 4.420 03/15/2019   INR 1.5 (H) 03/21/2019   HGBA1C 5.2 03/16/2019      PT/INR:  Recent Labs    03/21/19 1504  LABPROT 18.0*  INR 1.5*   Radiology: Dg Chest Port 1 View  Result Date: 03/21/2019 CLINICAL DATA:  Atelectasis  EXAM: PORTABLE CHEST 1 VIEW COMPARISON:  03/16/2019, CT 01/03/2019 FINDINGS: Interval intubation, tip of the endotracheal tube is 3.5 cm superior to the carina. Esophageal tube side port projects over distal esophagus. Left IJ Swan-Ganz catheter tip projects over right descending pulmonary artery. Interval postsurgical changes of the mediastinum with mitral valve repair. Placement of right-sided chest drainage catheters without well-defined pneumothorax. Low lung volumes with elevated right diaphragm. Opacity at the right base and right mid lung. Enlarged cardiomediastinal silhouette. IMPRESSION: 1. Placement of support lines and tubes as above. Esophageal tube side port at the level of distal esophagus, suggest further advancement for more optimal positioning 2. Low lung volumes with elevation of right diaphragm. Airspace disease at the right base with volume loss at the right thorax suggests atelectasis. More focal opacity in the right mid lung, could be due to focus of atelectasis or possibly hemorrhage. 3. Enlarged cardiomediastinal silhouette Electronically Signed   By: Kim  Fujinaga M.D.   On: 03/21/2019 15:33     Assessment/Plan: S/P Procedure(s) (LRB): MINIMALLY INVASIVE MITRAL VALVE REPAIR (MVR) using Memo 4D ring size 36 (Right) MINIMALLY INVASIVE MAZE PROCEDURE (N/A) TRANSESOPHAGEAL ECHOCARDIOGRAM (TEE) (N/A) Clipping Of Atrial Appendage using 45mm Atricure Pro2 Clip   1 doing well POD#1 2 hemodyn stable on low dose milrinone, norepi. Excellent CoOx, 74, PAP 18, excellent cardiac Indecies- wean as able . SBP is pretty variable but mostly 90-100's most recently. Tmax 99.7 3 sats excellent on RA, push pulm toilet/IS with ATX on CXR 4 moderate chest tube drainage- continue tubes 5 ABL anemia- trend over time, not at transfusion threshold 6 good UOP- cont spontaneous diuresis for now, may need lasix at some point Normal renal fxn/GFR 7 Thrombocytopenia- reactive, monitor trends 8 BS well  controlled, preop HgA1C 5.2 , stop insulin soon 9 start coumadin, lovenox 10 d/c Sganz 11 No leukocytosis 12 routine advancement of activity Wayne E Gold PA-C 03/22/2019 7:47 AM  Pager [phone]   I have seen and examined the patient and agree with the assessment and plan as outlined.  Doing well POD1.  Maintaining NSR w/ stable hemodynamics on low dose milrinone.  EKG w/ wide QRS and RBBB similar to preop EKG performed last March when patient was most recently in NSR.  Mobilize.  D/C lines.  Wean milrinone slowly.  Start Coumadin.   H , MD 03/22/2019 8:26 AM    

## 2019-03-23 ENCOUNTER — Encounter (HOSPITAL_COMMUNITY): Payer: Self-pay | Admitting: Interventional Cardiology

## 2019-03-23 ENCOUNTER — Inpatient Hospital Stay (HOSPITAL_COMMUNITY): Payer: BC Managed Care – PPO

## 2019-03-23 DIAGNOSIS — Z9889 Other specified postprocedural states: Secondary | ICD-10-CM

## 2019-03-23 DIAGNOSIS — I4819 Other persistent atrial fibrillation: Secondary | ICD-10-CM

## 2019-03-23 DIAGNOSIS — Z8679 Personal history of other diseases of the circulatory system: Secondary | ICD-10-CM

## 2019-03-23 DIAGNOSIS — I498 Other specified cardiac arrhythmias: Secondary | ICD-10-CM

## 2019-03-23 DIAGNOSIS — I341 Nonrheumatic mitral (valve) prolapse: Secondary | ICD-10-CM

## 2019-03-23 DIAGNOSIS — I5022 Chronic systolic (congestive) heart failure: Secondary | ICD-10-CM

## 2019-03-23 LAB — BASIC METABOLIC PANEL
Anion gap: 7 (ref 5–15)
BUN: 21 mg/dL (ref 8–23)
CO2: 23 mmol/L (ref 22–32)
Calcium: 8 mg/dL — ABNORMAL LOW (ref 8.9–10.3)
Chloride: 106 mmol/L (ref 98–111)
Creatinine, Ser: 1.12 mg/dL (ref 0.61–1.24)
GFR calc Af Amer: >60 mL/min (ref >60)
GFR calc non Af Amer: >60 mL/min (ref >60)
Glucose, Bld: 121 mg/dL — ABNORMAL HIGH (ref 70–99)
Potassium: 4.1 mmol/L (ref 3.5–5.1)
Sodium: 136 mmol/L (ref 135–145)

## 2019-03-23 LAB — COOXEMETRY PANEL
Carboxyhemoglobin: 2.2 % — ABNORMAL HIGH (ref 0.5–1.5)
Methemoglobin: 1.2 % (ref 0.0–1.5)
O2 Saturation: 69.1 %
Total hemoglobin: 9.7 g/dL — ABNORMAL LOW (ref 12.0–16.0)

## 2019-03-23 LAB — CBC
HCT: 28.8 % — ABNORMAL LOW (ref 39.0–52.0)
Hemoglobin: 9.1 g/dL — ABNORMAL LOW (ref 13.0–17.0)
MCH: 31.3 pg (ref 26.0–34.0)
MCHC: 31.6 g/dL (ref 30.0–36.0)
MCV: 99 fL (ref 80.0–100.0)
Platelets: 58 10*3/uL — ABNORMAL LOW (ref 150–400)
RBC: 2.91 MIL/uL — ABNORMAL LOW (ref 4.22–5.81)
RDW: 17.4 % — ABNORMAL HIGH (ref 11.5–15.5)
WBC: 8.1 10*3/uL (ref 4.0–10.5)
nRBC: 0 % (ref 0.0–0.2)

## 2019-03-23 LAB — GLUCOSE, CAPILLARY
Glucose-Capillary: 117 mg/dL — ABNORMAL HIGH (ref 70–99)
Glucose-Capillary: 123 mg/dL — ABNORMAL HIGH (ref 70–99)
Glucose-Capillary: 127 mg/dL — ABNORMAL HIGH (ref 70–99)
Glucose-Capillary: 128 mg/dL — ABNORMAL HIGH (ref 70–99)
Glucose-Capillary: 132 mg/dL — ABNORMAL HIGH (ref 70–99)
Glucose-Capillary: 133 mg/dL — ABNORMAL HIGH (ref 70–99)

## 2019-03-23 LAB — PROTIME-INR
INR: 1.4 — ABNORMAL HIGH (ref 0.8–1.2)
Prothrombin Time: 16.7 seconds — ABNORMAL HIGH (ref 11.4–15.2)

## 2019-03-23 MED ORDER — SPIRONOLACTONE 25 MG PO TABS
25.0000 mg | ORAL_TABLET | Freq: Every day | ORAL | Status: DC
  Administered 2019-03-23 – 2019-03-27 (×5): 25 mg via ORAL
  Filled 2019-03-23 (×5): qty 1

## 2019-03-23 MED ORDER — POTASSIUM CHLORIDE CRYS ER 20 MEQ PO TBCR
40.0000 meq | EXTENDED_RELEASE_TABLET | Freq: Once | ORAL | Status: AC
  Administered 2019-03-23: 40 meq via ORAL
  Filled 2019-03-23: qty 2

## 2019-03-23 MED ORDER — MOVING RIGHT ALONG BOOK
Freq: Once | Status: AC
  Administered 2019-03-23: 11:00:00
  Filled 2019-03-23: qty 1

## 2019-03-23 MED ORDER — FUROSEMIDE 10 MG/ML IJ SOLN
40.0000 mg | Freq: Three times a day (TID) | INTRAMUSCULAR | Status: DC
  Administered 2019-03-23: 40 mg via INTRAVENOUS
  Filled 2019-03-23: qty 4

## 2019-03-23 MED ORDER — POTASSIUM CHLORIDE CRYS ER 10 MEQ PO TBCR
10.0000 meq | EXTENDED_RELEASE_TABLET | Freq: Two times a day (BID) | ORAL | Status: DC
  Administered 2019-03-23 (×2): 10 meq via ORAL
  Filled 2019-03-23 (×2): qty 1

## 2019-03-23 MED ORDER — FUROSEMIDE 10 MG/ML IJ SOLN
8.0000 mg/h | INTRAVENOUS | Status: AC
  Administered 2019-03-23: 11:00:00 10 mg/h via INTRAVENOUS
  Administered 2019-03-24: 8 mg/h via INTRAVENOUS
  Filled 2019-03-23: qty 21
  Filled 2019-03-23: qty 25

## 2019-03-23 MED FILL — Mannitol IV Soln 20%: INTRAVENOUS | Qty: 500 | Status: AC

## 2019-03-23 MED FILL — Heparin Sodium (Porcine) Inj 1000 Unit/ML: INTRAMUSCULAR | Qty: 30 | Status: AC

## 2019-03-23 MED FILL — Magnesium Sulfate Inj 50%: INTRAMUSCULAR | Qty: 10 | Status: AC

## 2019-03-23 MED FILL — Lidocaine HCl(Cardiac) IV PF Soln Pref Syr 100 MG/5ML (2%): INTRAVENOUS | Qty: 25 | Status: AC

## 2019-03-23 MED FILL — Potassium Chloride Inj 2 mEq/ML: INTRAVENOUS | Qty: 40 | Status: AC

## 2019-03-23 MED FILL — Sodium Bicarbonate IV Soln 8.4%: INTRAVENOUS | Qty: 50 | Status: AC

## 2019-03-23 MED FILL — Sodium Chloride IV Soln 0.9%: INTRAVENOUS | Qty: 2000 | Status: AC

## 2019-03-23 MED FILL — Electrolyte-R (PH 7.4) Solution: INTRAVENOUS | Qty: 4000 | Status: AC

## 2019-03-23 NOTE — Progress Notes (Addendum)
[address]       [place] [address]             [phone]        CARDIOTHORACIC SURGERY PROGRESS NOTE   R1 Day Post-Op Procedure(s) (LRB): TEMPORARY PACEMAKER (N/A)  Subjective: Feels fine.  Minimal pain.  No SOB.  Very talkative  Objective: Vital signs: BP Readings from Last 1 Encounters:  03/23/19 117/71   Pulse Readings from Last 1 Encounters:  03/23/19 77   Resp Readings from Last 1 Encounters:  03/23/19 18   Temp Readings from Last 1 Encounters:  03/23/19 98.6 F (37 C) (Oral)    Hemodynamics: PAP: (27-47)/(11-26) 47/26  Mixed venous co-ox 69%   Physical Exam:  Rhythm:   junctional  Breath sounds: clear  Heart sounds:  RRR w/out murmur  Incisions:  Dressings dry, intact  Abdomen:  Soft, non-distended, non-tender  Extremities:  Warm, well-perfused  Chest tubes:  decreasing volume thin serosanguinous output, no air leak    Intake/Output from previous day: 05/20 0701 - 05/21 0700 In: 2494.9 [P.O.:1080; I.V.:1214.8; IV Piggyback:200.1] Out: 2060 [Urine:1475; Chest Tube:585] Intake/Output this shift: Total I/O In: 21.6 [I.V.:21.6] Out: 170 [Urine:100; Chest Tube:70]  Lab Results:  CBC: Recent Labs    03/22/19 2140 03/23/19 0355  WBC 8.9 8.1  HGB 9.4* 9.1*  HCT 29.8* 28.8*  PLT 59* 58*    BMET:  Recent Labs    03/22/19 2140 03/23/19 0355  NA 135 136  K 4.2 4.1  CL 104 106  CO2 24 23  GLUCOSE 140* 121*  BUN 21 21  CREATININE 1.09 1.12  CALCIUM 8.2* 8.0*     PT/INR:   Recent Labs    03/23/19 0355  LABPROT 16.7*  INR 1.4*    CBG (last 3)  Recent Labs    03/23/19 0005 03/23/19 0424 03/23/19 0749  GLUCAP 123* 117* 128*    ABG    Component Value Date/Time   PHART 7.389 03/21/2019 1930   PCO2ART 38.1 03/21/2019 1930   PO2ART 68.0 (L) 03/21/2019 1930   HCO3 22.9 03/21/2019 1930   TCO2 23 03/21/2019 2052   ACIDBASEDEF 2.0 03/21/2019 1930   O2SAT 69.1 03/23/2019 0412    CXR: PORTABLE CHEST  1 VIEW  COMPARISON:  Yesterday  FINDINGS: Left IJ line has been removed from the sheath. Chest drains remain. There is a temporary pacer from below which overlaps the heart, tip obscured or not covered. Low volume chest with atelectasis that is improved. No visible pneumothorax. Cardiomegaly. Left atrial clipping and mitral valve replacement.  IMPRESSION: 1. Remaining hardware in stable position. 2. New temporary pacer from below which is obscured distally. 3. Improved atelectasis.   Electronically Signed   By: Monte Fantasia M.D.   On: 03/23/2019 08:44   EKG: Probably junctional rhythm w/ RBBB, HR 70's - cannot definitely see P waves   Assessment/Plan: S/P Procedure(s) (LRB): TEMPORARY PACEMAKER (N/A)  Clinically stable overnight Underlying rhythm has recovered, now maintaining what is either junctional rhythm or sinus w/ long 1st degree AVB w/ HR 70's Temporary pacer is not sensing correctly and fires inappropriately even on VVI backup Hemodynamics stable off all drips Breathing comfortably w/ O2 sats 100% on room air Chronic diastolic CHF with expected post-op volume excess, weight reportedly 17 kg > preop Expected post op acute blood loss anemia, stable Post op thrombocytopenia, platelet count stable 58k   Keep temporary pacer wire in place but leave pacer off  for now  Keep on bedrest w/ Foley catheter in place while temporary wire in place  Resume temporary pacing and/or dopamine if bradycardia recurs  Hold lovenox due to thrombocytopenia  Continue Coumadin  Start lasix drip    Rexene Alberts, MD 03/23/2019 8:50 AM

## 2019-03-23 NOTE — Progress Notes (Signed)
Progress Note  Patient Name: [name] Date of Encounter: 03/23/2019  Primary Cardiologist: Quay Burow, MD   Subjective   Required a transfemoral TPW for severe bradycardia due to AV block w hypotension. Feels much better today. Currently accelerated junctional rhythm at 77 bpm, despite no cathecholamine infusion.  Inpatient Medications    Scheduled Meds: . acetaminophen  1,000 mg Oral Q6H  . aspirin EC  325 mg Oral Daily  . bisacodyl  10 mg Oral Daily   Or  . bisacodyl  10 mg Rectal Daily  . chlorhexidine gluconate (MEDLINE KIT)  15 mL Mouth Rinse BID  . Chlorhexidine Gluconate Cloth  6 each Topical Daily  . Diltiazem cream/lidocaine ointment (patient's own medication)  1 application Rectal TID  . docusate sodium  200 mg Oral Daily  . enoxaparin (LOVENOX) injection  30 mg Subcutaneous QHS  . furosemide  40 mg Intravenous Q8H  . ketorolac  15 mg Intravenous Q6H  . mouth rinse  15 mL Mouth Rinse BID  . moving right along book   Does not apply Once  . pantoprazole  40 mg Oral Daily  . potassium chloride  10 mEq Oral BID  . sodium chloride flush  3 mL Intravenous Q12H  . spironolactone  25 mg Oral Daily  . warfarin  2.5 mg Oral q1800  . Warfarin - Physician Dosing Inpatient   Does not apply q1800   Continuous Infusions: . sodium chloride    . DOPamine Stopped (03/22/19 1915)  . lactated ringers Stopped (03/21/19 1524)  . lactated ringers 20 mL/hr at 03/23/19 0800   PRN Meds: ALPRAZolam, Lidocaine (Anorectal), metoprolol tartrate, morphine injection, ondansetron (ZOFRAN) IV, oxyCODONE, sodium chloride flush, traMADol   Vital Signs    Vitals:   03/23/19 0600 03/23/19 0700 03/23/19 0800 03/23/19 0811  BP: 123/74 120/81 117/71   Pulse: 71 71 74 73  Resp: _0 Temp:  98.6 F (37 C)    TempSrc:  Oral    SpO2: 97% 95% 93% 96%  Weight:      Height:        Intake/Output Summary (Last 24 hours) at 03/23/2019 0842 Last data filed at 03/23/2019 0800  Gross per 24 hour  Intake 2492.35 ml  Output 2115 ml  Net 377.35 ml   Last 3 Weights 03/23/2019 03/22/2019 03/21/2019  Weight (lbs) 277 lb 9 oz 263 lb 0.1 oz 241 lb  Weight (kg) 125.9 kg 119.3 kg 109.317 kg      Telemetry    accelerated junctional rhythm  - Personally Reviewed  ECG    accelerated junctional rhythm, old RBBB - Personally Reviewed  Physical Exam  A little pale, looks comfortable, smiling GEN: No acute distress.   Neck: No JVD Cardiac: RRR, no murmurs, rubs, or gallops.  Respiratory: Clear to auscultation bilaterally. GI: Soft, nontender, non-distended  MS: No edema; No deformity. Neuro:  Nonfocal  Psych: Normal affect   Labs    Chemistry Recent Labs  Lab 03/16/19 1144  03/22/19 0404 03/22/19 2140 03/23/19 0355  NA 138   < > 141 135 136  K 4.9   < > 3.8 4.2 4.1  CL 106   < > 110 104 106  CO2 23  --  _1 GLUCOSE 120*   < > 116* 140* 121*  BUN 29*   < > _2 CREATININE 1.03   < > 0.80 1.09 1.12  CALCIUM 9.2  --  7.7*  8.2* 8.0*  PROT 5.6*  --   --   --   --   ALBUMIN 3.5  --   --   --   --   AST 26  --   --   --   --   ALT 32  --   --   --   --   ALKPHOS 33*  --   --   --   --   BILITOT 1.3*  --   --   --   --   GFRNONAA >60   < > >60 >60 >60  GFRAA >60   < > >60 >60 >60  ANIONGAP 9  --  _0 < > = values in this interval not displayed.     Hematology Recent Labs  Lab 03/22/19 0404 03/22/19 2140 03/23/19 0355  WBC 10.4 8.9 8.1  RBC 3.24* 2.98* 2.91*  HGB 10.1* 9.4* 9.1*  HCT 31.6* 29.8* 28.8*  MCV 97.5 100.0 99.0  MCH 31.2 31.5 31.3  MCHC 32.0 31.5 31.6  RDW 17.8* 17.7* 17.4*  PLT 82* 59* 58*    Cardiac EnzymesNo results for input(s): TROPONINI in the last 168 hours. No results for input(s): TROPIPOC in the last 168 hours.   BNPNo results for input(s): BNP, PROBNP in the last 168 hours.   DDimer No results for input(s): DDIMER in the last 168 hours.   Radiology    Dg Chest Port 1 View  Result Date: 03/22/2019  CLINICAL DATA:  Status post MVR with chest pain, initial encounter EXAM: PORTABLE CHEST 1 VIEW COMPARISON:  03/21/2019 FINDINGS: Cardiac shadow is mildly enlarged but stable. Swan-Ganz catheter is again seen. The endotracheal tube and nasogastric catheter have been removed. Right-sided chest tube is again seen and stable. Ovoid density in the mid right lung is noted consistent with fluid in the minor fissure. Volume loss on the right is seen. Left atrial clip is noted and stable. IMPRESSION: Postsurgical changes on the right with chest tube in place. No pneumothorax is seen. Some minimal residual fluid in the minor fissure is noted. Electronically Signed   By: Inez Catalina M.D.   On: 03/22/2019 07:48   Dg Chest Port 1 View  Result Date: 03/21/2019 CLINICAL DATA:  Atelectasis EXAM: PORTABLE CHEST 1 VIEW COMPARISON:  03/16/2019, CT 01/03/2019 FINDINGS: Interval intubation, tip of the endotracheal tube is 3.5 cm superior to the carina. Esophageal tube side port projects over distal esophagus. Left IJ Swan-Ganz catheter tip projects over right descending pulmonary artery. Interval postsurgical changes of the mediastinum with mitral valve repair. Placement of right-sided chest drainage catheters without well-defined pneumothorax. Low lung volumes with elevated right diaphragm. Opacity at the right base and right mid lung. Enlarged cardiomediastinal silhouette. IMPRESSION: 1. Placement of support lines and tubes as above. Esophageal tube side port at the level of distal esophagus, suggest further advancement for more optimal positioning 2. Low lung volumes with elevation of right diaphragm. Airspace disease at the right base with volume loss at the right thorax suggests atelectasis. More focal opacity in the right mid lung, could be due to focus of atelectasis or possibly hemorrhage. 3. Enlarged cardiomediastinal silhouette Electronically Signed   By: Donavan Foil M.D.   On: 03/21/2019 15:33    Cardiac Studies    12/09/2018 TTE 1. The left ventricle has moderately reduced systolic function of 19-75%. The cavity size was severely increased. There is mildly increased left ventricular wall thickness. Left ventricular diastology could not  be evaluated Left ventricular diffuse  hypokinesis. 2. The right ventricle has moderately reduced systolic function. The cavity was normal. There is no increase in right ventricular wall thickness. 3. Left atrial size was severely dilated. 4. Right atrial size was severely dilated. 5. Possible flail segement of posterior MV leaflet. Mitral valve regurgitation is moderate by color flow Doppler. 6. The tricuspid valve is normal in structure. 7. The aortic valve is tricuspid There is mild thickening of the aortic valve. 8. The pulmonic valve was normal in structure. 9. There is mild dilatation of the aortic root and ascending aorta. 10. Moderate global reduction in LV systolic function; severe LVE; severe biatrial enlargement; moderate RV dysfunction; possible flail segment of posterior MV leaflet; eccentric MR difficult to quantitate; suggest TEE to further assess.  01/03/2019 TEE  1. The left ventricle has moderately reduced systolic function, with an ejection fraction of 35-40%.  2. The right ventricle has moderately reduced systolic function.  3. Left atrial size was severely dilated.  4. Normal LA appendage with no evidence of thrombus.  5. Right atrial size was severely dilated.  6. There is severe myoxmatous degeneration of the posterior mitral valve leafelt with severe prolapse of the P2 component of the posterior leaflet. There appears to be a ruptured chordae tendinae with partial flail leaflet. There is moderate to severe  eccentric mitral regurgitation directed towards the interatrial septum and wraps around the LA to the posterior wall and mouth of the pulmonary vein but no flow reversal in the pulmonary vein by doppler.  7. The tricuspid valve was  normal in structure with trivial TR.  8. The aortic valve is tricuspid.  9. The pulmonic valve was normal in structure with trivial PR. 10. The aortic root and ascending aorta are normal in size and structure. 11. Normal interatrial septum with no evidence of shunt by colorflow doppler.  Patient Profile     63 y.o. male s/p minimally invasive MV repair and Maze 03/21/2019 for MVP/flail posterior leaflet with severe MR and HF with reduced LV systolic function, Afib; post op course complicated by severe bradycardia and required transvenous TPW due to inability to capture via epicardial wires. Normal coronaries by angio.  Assessment & Plan    Currently in accelerated junctional rhythm (which may be outpacing the sinus node versus sinus node dysfunction post surgery and Maze), QRSmorphology similar to preop RBBB. Continue monitoring, good chance that he will recover normal electrical activity in next 24-48h. Hopefully will not require a permanent pacemaker.     For questions or updates, please contact [facility] Please consult [email or web address] for contact info under        Signed, Sanda Klein, MD  03/23/2019, 8:42 AM

## 2019-03-23 NOTE — Discharge Instructions (Addendum)
Mitral Valve Replacement/Repair , Care After This sheet gives you information about how to care for yourself after your procedure. Your health care provider may also give you more specific instructions. If you have problems or questions, contact your health care provider. What can I expect after the procedure? After the procedure, it is common to have:  Pain at the incision area that may last for several weeks. Follow these instructions at home: Incision care   Follow instructions from your health care provider about how to take care of your incision. Make sure you: ? Wash your hands with soap and water before you change your bandage (dressing). If soap and water are not available, use hand sanitizer. ? Change your dressing as told by your health care provider. ? Leave stitches (sutures), skin glue, or adhesive strips in place. These skin closures may need to stay in place for 2 weeks or longer. If adhesive strip edges start to loosen and curl up, you may trim the loose edges. Do not remove adhesive strips completely unless your health care provider tells you to do that.  Check your incision area every day for signs of infection. Check for: ? More redness, swelling, or pain. ? More fluid or blood. ? Warmth. ? Pus or a bad smell.  Do not apply powder or lotion to the area. Driving  Do not drive until your health care provider approves.  Do not drive or use heavy machinery while taking prescription pain medicines. Bathing  Do not take baths, swim, or use a hot tub for 2-4 weeks after surgery, or until your health care provider approves.You may shower    To wash the incision site, gently wash with soap and water and pat the area dry with a clean towel. Do not rub the incision area. That may cause bleeding. Activity  Rest as told by your health care provider. Ask your health care provider when you can resume normal activities, including sexual activity.  Avoid the following  activities for 6-8 weeks, or as long as directed: ? Lifting anything that is heavier than 10 lb (4.5 kg), or the limit that your health care provider tells you. ? Pushing or pulling things with your arms.  Avoid climbing stairs and using the handrail to pull yourself up for the first 2-3 weeks after surgery.  Avoid airplane travel for 4-6 weeks, or as long as directed.  Avoid sitting for long periods of time and crossing your legs. Get up and move around at least once every 1-2 hours.  If you are taking blood thinners (anticoagulants), avoid activities that have a high risk of injury. Ask your health care provider what activities are safe for you. Lifestyle  Limit alcohol intake to no more than 1 drink a day for nonpregnant women and 2 drinks a day for men. One drink equals 12 oz of beer, 5 oz of wine, or 1 oz of hard liquor.  Do not use any products that contain nicotine or tobacco, such as cigarettes and e-cigarettes. If you need help quitting, ask your health care provider. General instructions   Take your temperature every day and weigh yourself every morning for the first 7 days after surgery. Write your temperatures and weight down and take this record with you to any follow-up visits.  Take over-the-counter and prescription medicines only as told by your health care provider.  To prevent or treat constipation while you are taking prescription pain medicine, your health care provider may recommend that you: ?  Drink enough fluid to keep your urine clear or pale yellow. ? Take over-the-counter or prescription medicines. ? Eat foods that are high in fiber, such as fresh fruits and vegetables, whole grains, and beans. ? Limit foods that are high in fat and processed sugars, such as fried and sweet foods.  Follow instructions from your health care provider about eating or drinking restrictions.  Wear compression stockings for at least 2 weeks, or as long as told by your health care  provider. These stockings help to prevent blood clots and reduce swelling in your legs. If your ankles are swollen after 2 weeks, continue to wear the stockings.  Keep all follow-up visits as told by your health care provider. This is important. Contact a health care provider if:  You develop a skin rash.  Your weight is increasing each day over 2-3 days.  You gain 2 lb (1 kg) or more in a single day.  You have a fever. Get help right away if:  You develop chest pain that feels different from the pain caused by your incision.  You develop shortness of breath or difficulty breathing.  You have more redness, swelling, or pain around your incision.  You have more fluid or blood coming from your incision.  Your incision feels warm to the touch.  You have pus or a bad smell coming from your incision.  You feel light-headed. This information is not intended to replace advice given to you by your health care provider. Make sure you discuss any questions you have with your health care provider. Document Released: 05/08/2005 Document Revised: 07/31/2016 Document Reviewed: 07/31/2016 Elsevier Interactive Patient Education  2019 [place] on my medicine - Coumadin   (Warfarin)  This medication education was reviewed with me or my healthcare representative as part of my discharge preparation.  The pharmacist that spoke with me during my hospital stay was:  Dareen Piano, [facility]  Why was Coumadin prescribed for you? Coumadin was prescribed for you because you have a blood clot or a medical condition that can cause an increased risk of forming blood clots. Blood clots can cause serious health problems by blocking the flow of blood to the heart, lung, or brain. Coumadin can prevent harmful blood clots from forming. As a reminder your indication for Coumadin is:   Blood Clot Prevention After Heart Valve Surgery  What test will check on my response to Coumadin? While on  Coumadin (warfarin) you will need to have an INR test regularly to ensure that your dose is keeping you in the desired range. The INR (international normalized ratio) number is calculated from the result of the laboratory test called prothrombin time (PT).  If an INR APPOINTMENT HAS NOT ALREADY BEEN MADE FOR YOU please schedule an appointment to have this lab work done by your health care provider within 7 days. Your INR goal is usually a number between:  2 to 3 or your provider may give you a more narrow range like 2-2.5.  Ask your health care provider during an office visit what your goal INR is.  What  do you need to  know  About  COUMADIN? Take Coumadin (warfarin) exactly as prescribed by your healthcare provider about the same time each day.  DO NOT stop taking without talking to the doctor who prescribed the medication.  Stopping without other blood clot prevention medication to take the place of Coumadin may increase your risk of developing a new clot or stroke.  Get refills before you run out.  What do you do if you miss a dose? If you miss a dose, take it as soon as you remember on the same day then continue your regularly scheduled regimen the next day.  Do not take two doses of Coumadin at the same time.  Important Safety Information A possible side effect of Coumadin (Warfarin) is an increased risk of bleeding. You should call your healthcare provider right away if you experience any of the following: ? Bleeding from an injury or your nose that does not stop. ? Unusual colored urine (red or dark brown) or unusual colored stools (red or black). ? Unusual bruising for unknown reasons. ? A serious fall or if you hit your head (even if there is no bleeding).  Some foods or medicines interact with Coumadin (warfarin) and might alter your response to warfarin. To help avoid this: ? Eat a balanced diet, maintaining a consistent amount of Vitamin K. ? Notify your provider about major diet  changes you plan to make. ? Avoid alcohol or limit your intake to 1 drink for women and 2 drinks for men per day. (1 drink is 5 oz. wine, 12 oz. beer, or 1.5 oz. liquor.)  Make sure that ANY health care provider who prescribes medication for you knows that you are taking Coumadin (warfarin).  Also make sure the healthcare provider who is monitoring your Coumadin knows when you have started a new medication including herbals and non-prescription products.  Coumadin (Warfarin)  Major Drug Interactions  Increased Warfarin Effect Decreased Warfarin Effect  Alcohol (large quantities) Antibiotics (esp. Septra/Bactrim, Flagyl, Cipro) Amiodarone (Cordarone) Aspirin (ASA) Cimetidine (Tagamet) Megestrol (Megace) NSAIDs (ibuprofen, naproxen, etc.) Piroxicam (Feldene) Propafenone (Rythmol SR) Propranolol (Inderal) Isoniazid (INH) Posaconazole (Noxafil) Barbiturates (Phenobarbital) Carbamazepine (Tegretol) Chlordiazepoxide (Librium) Cholestyramine (Questran) Griseofulvin Oral Contraceptives Rifampin Sucralfate (Carafate) Vitamin K   Coumadin (Warfarin) Major Herbal Interactions  Increased Warfarin Effect Decreased Warfarin Effect  Garlic Ginseng Ginkgo biloba Coenzyme Q10 Green tea St. Johns wort    Coumadin (Warfarin) FOOD Interactions  Eat a consistent number of servings per week of foods HIGH in Vitamin K (1 serving =  cup)  Collards (cooked, or boiled & drained) Kale (cooked, or boiled & drained) Mustard greens (cooked, or boiled & drained) Parsley *serving size only =  cup Spinach (cooked, or boiled & drained) Swiss chard (cooked, or boiled & drained) Turnip greens (cooked, or boiled & drained)  Eat a consistent number of servings per week of foods MEDIUM-HIGH in Vitamin K (1 serving = 1 cup)  Asparagus (cooked, or boiled & drained) Broccoli (cooked, boiled & drained, or raw & chopped) Brussel sprouts (cooked, or boiled & drained) *serving size only =  cup Lettuce,  raw (green leaf, endive, romaine) Spinach, raw Turnip greens, raw & chopped   These websites have more information on Coumadin (warfarin):  FailFactory.se; VeganReport.com.au;  ------------------     YOUR CARDIOLOGY TEAM HAS ARRANGED FOR AN E-VISIT FOR YOUR APPOINTMENT - PLEASE REVIEW IMPORTANT INFORMATION BELOW SEVERAL DAYS PRIOR TO YOUR APPOINTMENT  Due to the recent COVID-19 pandemic, we are transitioning in-person office visits to tele-medicine visits in an effort to decrease unnecessary exposure to our patients, their families, and staff. These visits are billed to your insurance just like a normal visit is. We also encourage you to sign up for MyChart if you have not already done so. You will need a smartphone if possible. For patients that do not have this, we can still complete  the visit using a regular telephone but do prefer a smartphone to enable video when possible. You may have a family member that lives with you that can help. If possible, we also ask that you have a blood pressure cuff and scale at home to measure your blood pressure, heart rate and weight prior to your scheduled appointment. Patients with clinical needs that need an in-person evaluation and testing will still be able to come to the office if absolutely necessary. If you have any questions, feel free to call our office.   2-3 DAYS BEFORE YOUR APPOINTMENT  You will receive a telephone call from one of our [facility] team members - your caller ID may say "Unknown caller." If this is a video visit, we will walk you through how to get the video launched on your phone. We will remind you check your blood pressure, heart rate and weight prior to your scheduled appointment. If you have an Apple Watch or Kardia, please upload any pertinent ECG strips the day before or morning of your appointment to [place]. Our staff will also make sure you have reviewed the consent and agree to move forward with your  scheduled tele-health visit.     THE DAY OF YOUR APPOINTMENT  Approximately 15 minutes prior to your scheduled appointment, you will receive a telephone call from one of [facility] team - your caller ID may say "Unknown caller."  Our staff will confirm medications, vital signs for the day and any symptoms you may be experiencing. Please have this information available prior to the time of visit start. It may also be helpful for you to have a pad of paper and pen handy for any instructions given during your visit. They will also walk you through joining the smartphone meeting if this is a video visit.    CONSENT FOR TELE-HEALTH VISIT - PLEASE REVIEW  I hereby voluntarily request, consent and authorize [facility] and its employed or contracted physicians, physician assistants, nurse practitioners or other licensed health care professionals (the Practitioner), to provide me with telemedicine health care services (the Services") as deemed necessary by the treating Practitioner. I acknowledge and consent to receive the Services by the Practitioner via telemedicine. I understand that the telemedicine visit will involve communicating with the Practitioner through live audiovisual communication technology and the disclosure of certain medical information by electronic transmission. I acknowledge that I have been given the opportunity to request an in-person assessment or other available alternative prior to the telemedicine visit and am voluntarily participating in the telemedicine visit.  I understand that I have the right to withhold or withdraw my consent to the use of telemedicine in the course of my care at any time, without affecting my right to future care or treatment, and that the Practitioner or I may terminate the telemedicine visit at any time. I understand that I have the right to inspect all information obtained and/or recorded in the course of the telemedicine visit and may receive copies of  available information for a reasonable fee.  I understand that some of the potential risks of receiving the Services via telemedicine include:   Delay or interruption in medical evaluation due to technological equipment failure or disruption;  Information transmitted may not be sufficient (e.g. poor resolution of images) to allow for appropriate medical decision making by the Practitioner; and/or   In rare instances, security protocols could fail, causing a breach of personal health information.  Furthermore, I acknowledge that it is my responsibility  to provide information about my medical history, conditions and care that is complete and accurate to the best of my ability. I acknowledge that Practitioner's advice, recommendations, and/or decision may be based on factors not within their control, such as incomplete or inaccurate data provided by me or distortions of diagnostic images or specimens that may result from electronic transmissions. I understand that the practice of medicine is not an exact science and that Practitioner makes no warranties or guarantees regarding treatment outcomes. I acknowledge that I will receive a copy of this consent concurrently upon execution via email to the email address I last provided but may also request a printed copy by calling the office of [facility].    I understand that my insurance will be billed for this visit.   I have read or had this consent read to me.  I understand the contents of this consent, which adequately explains the benefits and risks of the Services being provided via telemedicine.   I have been provided ample opportunity to ask questions regarding this consent and the Services and have had my questions answered to my satisfaction.  I give my informed consent for the services to be provided through the use of telemedicine in my medical care  By participating in this telemedicine visit I agree to the above.

## 2019-03-23 NOTE — Progress Notes (Signed)
Patient ID: [name], male   DOB: 02-Feb-1956, 63 y.o.   MRN: [id number] EVENING ROUNDS NOTE :     [address]       [place] [address]             [phone]                 1 Day Post-Op Procedure(s) (LRB): TEMPORARY PACEMAKER (N/A)  Total Length of Stay:  LOS: 2 days  BP (!) 153/84   Pulse 80   Temp 98.7 F (37.1 C) (Oral)   Resp 20   Ht 6\' 3"  (1.905 m)   Wt 125.9 kg   SpO2 95%   BMI 34.69 kg/m   .Intake/Output      05/20 0701 - 05/21 0700 05/21 0701 - 05/22 0700   P.O. 1080 720   I.V. (mL/kg) 1214.8 (9.6) 282 (2.2)   Blood     IV Piggyback 200.1    Total Intake(mL/kg) 2494.9 (19.8) 1002 (8)   Urine (mL/kg/hr) 1475 (0.5) 2400 (1.7)   Blood     Chest Tube 585 255   Total Output 2060 2655   Net +434.9 -1653          . sodium chloride    . DOPamine Stopped (03/22/19 1915)  . furosemide (LASIX) infusion 10 mg/hr (03/23/19 1700)  . lactated ringers Stopped (03/21/19 1524)  . lactated ringers 20 mL/hr at 03/23/19 1700     Lab Results  Component Value Date   WBC 8.1 03/23/2019   HGB 9.1 (L) 03/23/2019   HCT 28.8 (L) 03/23/2019   PLT 58 (L) 03/23/2019   GLUCOSE 121 (H) 03/23/2019   ALT 32 03/16/2019   AST 26 03/16/2019   NA 136 03/23/2019   K 4.1 03/23/2019   CL 106 03/23/2019   CREATININE 1.12 03/23/2019   BUN 21 03/23/2019   CO2 23 03/23/2019   TSH 4.420 03/15/2019   INR 1.4 (H) 03/23/2019   HGBA1C 5.2 03/16/2019   plts low Pacer in but hr at 80 with out pacing   Grace Isaac MD  [facility] [phone] Office [phone] 03/23/2019 5:55 PM

## 2019-03-23 NOTE — Progress Notes (Signed)
TCTS DAILY ICU PROGRESS NOTE                   [address]            [place] [address]          [phone]   1 Day Post-Op Procedure(s) (LRB): TEMPORARY PACEMAKER (N/A)  Total Length of Stay:  LOS: 2 days   Subjective: Events of last evening noted (temp pacer in place), sinus with 1 deg AVB on EKG, now off gtts  Objective: Vital signs in last 24 hours: Temp:  [97.4 F (36.3 C)-99 F (37.2 C)] 98.6 F (37 C) (05/21 0700) Pulse Rate:  [54-141] 73 (05/21 0811) Cardiac Rhythm: Ventricular paced (05/21 0400) Resp:  [10-36] 19 (05/21 0811) BP: (93-154)/(59-121) 117/71 (05/21 0800) SpO2:  [0 %-100 %] 96 % (05/21 0811) Arterial Line BP: (81-145)/(40-66) 140/66 (05/20 1600) Weight:  [125.9 kg] 125.9 kg (05/21 0439)  Filed Weights   03/21/19 0550 03/22/19 0500 03/23/19 0439  Weight: 109.3 kg 119.3 kg 125.9 kg    Weight change: 6.6 kg   Hemodynamic parameters for last 24 hours: PAP: (27-47)/(11-26) 47/26  Intake/Output from previous day: 05/20 0701 - 05/21 0700 In: 2494.9 [P.O.:1080; I.V.:1214.8; IV Piggyback:200.1] Out: 2060 [FHQRF:7588; Chest Tube:585]  Intake/Output this shift: Total I/O In: 21.6 [I.V.:21.6] Out: 170 [Urine:100; Chest Tube:70]  Current Meds: Scheduled Meds: . acetaminophen  1,000 mg Oral Q6H  . aspirin EC  325 mg Oral Daily  . bisacodyl  10 mg Oral Daily   Or  . bisacodyl  10 mg Rectal Daily  . chlorhexidine gluconate (MEDLINE KIT)  15 mL Mouth Rinse BID  . Chlorhexidine Gluconate Cloth  6 each Topical Daily  . Diltiazem cream/lidocaine ointment (patient's own medication)  1 application Rectal TID  . docusate sodium  200 mg Oral Daily  . enoxaparin (LOVENOX) injection  30 mg Subcutaneous QHS  . furosemide  40 mg Intravenous Q8H  . ketorolac  15 mg Intravenous Q6H  . mouth rinse  15 mL Mouth Rinse BID  . moving right along book   Does not apply Once  . pantoprazole  40 mg Oral Daily  . potassium chloride  10 mEq Oral BID   . sodium chloride flush  3 mL Intravenous Q12H  . spironolactone  25 mg Oral Daily  . warfarin  2.5 mg Oral q1800  . Warfarin - Physician Dosing Inpatient   Does not apply q1800   Continuous Infusions: . sodium chloride    . DOPamine Stopped (03/22/19 1915)  . lactated ringers Stopped (03/21/19 1524)  . lactated ringers 20 mL/hr at 03/23/19 0800   PRN Meds:.ALPRAZolam, Lidocaine (Anorectal), metoprolol tartrate, morphine injection, ondansetron (ZOFRAN) IV, oxyCODONE, sodium chloride flush, traMADol  General appearance: alert, cooperative and no distress Heart: regular rate and rhythm Lungs: dim right base Abdomen: soft, non-tender Extremities: + LE edema Wound: dressings CDI  Lab Results: CBC: Recent Labs    03/22/19 2140 03/23/19 0355  WBC 8.9 8.1  HGB 9.4* 9.1*  HCT 29.8* 28.8*  PLT 59* 58*   BMET:  Recent Labs    03/22/19 2140 03/23/19 0355  NA 135 136  K 4.2 4.1  CL 104 106  CO2 24 23  GLUCOSE 140* 121*  BUN 21 21  CREATININE 1.09 1.12  CALCIUM 8.2* 8.0*    CMET: Lab Results  Component Value Date   WBC 8.1 03/23/2019   HGB 9.1 (L) 03/23/2019   HCT 28.8 (L)  03/23/2019   PLT 58 (L) 03/23/2019   GLUCOSE 121 (H) 03/23/2019   ALT 32 03/16/2019   AST 26 03/16/2019   NA 136 03/23/2019   K 4.1 03/23/2019   CL 106 03/23/2019   CREATININE 1.12 03/23/2019   BUN 21 03/23/2019   CO2 23 03/23/2019   TSH 4.420 03/15/2019   INR 1.4 (H) 03/23/2019   HGBA1C 5.2 03/16/2019      PT/INR:  Recent Labs    03/23/19 0355  LABPROT 16.7*  INR 1.4*   Radiology: No results found.   Assessment/Plan: S/P Procedure(s) (LRB): TEMPORARY PACEMAKER (N/A)  1 doing well POD#2 2 Now with temp pacer, sinus with 1 deg AVB, hopefully can remove by tomorrow if stable. Hemodyn stable off milrinone and dopamine. Co-OX 69 3 routine pulm toilet for mod atx. Sats good on RA 4 stable GFR, renal fxn, diuretics for volume overload- lasix gtt and spironolactone. Potassium  supplements 5 H/H is pretty stable- ABL anemia, thrombocytopenia is stable, not on lovenox 6 coumadin initiated, INR 1.4 7 BS with pretty good control 8 No leukocytosis or fevers 9 Mod CT drainage- cont tubes, 735 cc yesterday  John Giovanni PA-C 03/23/2019 8:45 AM  Pager [phone]

## 2019-03-24 LAB — CBC
HCT: 30.7 % — ABNORMAL LOW (ref 39.0–52.0)
Hemoglobin: 10.2 g/dL — ABNORMAL LOW (ref 13.0–17.0)
MCH: 31.6 pg (ref 26.0–34.0)
MCHC: 33.2 g/dL (ref 30.0–36.0)
MCV: 95 fL (ref 80.0–100.0)
Platelets: 72 10*3/uL — ABNORMAL LOW (ref 150–400)
RBC: 3.23 MIL/uL — ABNORMAL LOW (ref 4.22–5.81)
RDW: 16.7 % — ABNORMAL HIGH (ref 11.5–15.5)
WBC: 8.6 10*3/uL (ref 4.0–10.5)
nRBC: 0 % (ref 0.0–0.2)

## 2019-03-24 LAB — BASIC METABOLIC PANEL
Anion gap: 9 (ref 5–15)
BUN: 17 mg/dL (ref 8–23)
CO2: 29 mmol/L (ref 22–32)
Calcium: 8.3 mg/dL — ABNORMAL LOW (ref 8.9–10.3)
Chloride: 98 mmol/L (ref 98–111)
Creatinine, Ser: 0.98 mg/dL (ref 0.61–1.24)
GFR calc Af Amer: >60 mL/min (ref >60)
GFR calc non Af Amer: >60 mL/min (ref >60)
Glucose, Bld: 119 mg/dL — ABNORMAL HIGH (ref 70–99)
Potassium: 3.4 mmol/L — ABNORMAL LOW (ref 3.5–5.1)
Sodium: 136 mmol/L (ref 135–145)

## 2019-03-24 LAB — PROTIME-INR
INR: 1.2 (ref 0.8–1.2)
Prothrombin Time: 14.8 seconds (ref 11.4–15.2)

## 2019-03-24 MED ORDER — POTASSIUM CHLORIDE CRYS ER 20 MEQ PO TBCR
20.0000 meq | EXTENDED_RELEASE_TABLET | ORAL | Status: DC
  Administered 2019-03-24: 20 meq via ORAL
  Filled 2019-03-24: qty 1

## 2019-03-24 MED ORDER — SACUBITRIL-VALSARTAN 24-26 MG PO TABS
1.0000 | ORAL_TABLET | Freq: Two times a day (BID) | ORAL | Status: DC
  Administered 2019-03-24 – 2019-03-27 (×7): 1 via ORAL
  Filled 2019-03-24 (×10): qty 1

## 2019-03-24 MED ORDER — POTASSIUM CHLORIDE CRYS ER 20 MEQ PO TBCR
40.0000 meq | EXTENDED_RELEASE_TABLET | ORAL | Status: AC
  Administered 2019-03-24 (×2): 40 meq via ORAL
  Filled 2019-03-24 (×2): qty 2

## 2019-03-24 MED ORDER — WARFARIN SODIUM 5 MG PO TABS
5.0000 mg | ORAL_TABLET | Freq: Every day | ORAL | Status: DC
  Administered 2019-03-24 – 2019-03-25 (×2): 5 mg via ORAL
  Filled 2019-03-24 (×2): qty 1

## 2019-03-24 MED ORDER — ENOXAPARIN SODIUM 40 MG/0.4ML ~~LOC~~ SOLN
40.0000 mg | Freq: Every day | SUBCUTANEOUS | Status: DC
  Administered 2019-03-24 – 2019-03-26 (×3): 40 mg via SUBCUTANEOUS
  Filled 2019-03-24 (×3): qty 0.4

## 2019-03-24 MED ORDER — POTASSIUM CHLORIDE CRYS ER 20 MEQ PO TBCR: 40.0000 meq | EXTENDED_RELEASE_TABLET | Freq: Two times a day (BID) | ORAL | Status: DC

## 2019-03-24 MED ORDER — TORSEMIDE 20 MG PO TABS
20.0000 mg | ORAL_TABLET | Freq: Two times a day (BID) | ORAL | Status: DC
  Administered 2019-03-25 – 2019-03-27 (×5): 20 mg via ORAL
  Filled 2019-03-24 (×5): qty 1

## 2019-03-24 MED ORDER — POTASSIUM CHLORIDE CRYS ER 20 MEQ PO TBCR
40.0000 meq | EXTENDED_RELEASE_TABLET | Freq: Once | ORAL | Status: AC
  Administered 2019-03-24: 40 meq via ORAL
  Filled 2019-03-24: qty 2

## 2019-03-24 MED ORDER — ASPIRIN EC 81 MG PO TBEC
81.0000 mg | DELAYED_RELEASE_TABLET | Freq: Every day | ORAL | Status: DC
  Administered 2019-03-24 – 2019-03-27 (×4): 81 mg via ORAL
  Filled 2019-03-24 (×4): qty 1

## 2019-03-24 NOTE — Progress Notes (Signed)
Left groin venous sheath removed at 0926 by this RN with Lujean Rave RN. Manual pressure held for 15 minutes. VSS throughout. Hemostasis achieved. Site noted to be a Level 0. Pressure dressing applied and pt educated.

## 2019-03-24 NOTE — Progress Notes (Signed)
TCTS BRIEF SICU PROGRESS NOTE  2 Days Post-Op  S/P Procedure(s) (LRB): TEMPORARY PACEMAKER (N/A)   Excellent day, ambulating around ICU NSR w/ stable BP Breathing comfortably on room air  Plan: Continue current plan  Rexene Alberts, MD 03/24/2019 5:21 PM

## 2019-03-24 NOTE — Progress Notes (Signed)
Progress Note  Patient Name: [name] Date of Encounter: 03/24/2019  Primary Cardiologist: Quay Burow, MD   Subjective   Better, no bradycardia. Back in NSR with a very long PR interval.  Inpatient Medications    Scheduled Meds: . acetaminophen  1,000 mg Oral Q6H  . aspirin EC  81 mg Oral Daily  . bisacodyl  10 mg Oral Daily   Or  . bisacodyl  10 mg Rectal Daily  . Chlorhexidine Gluconate Cloth  6 each Topical Daily  . Diltiazem cream/lidocaine ointment (patient's own medication)  1 application Rectal TID  . docusate sodium  200 mg Oral Daily  . enoxaparin (LOVENOX) injection  40 mg Subcutaneous Daily  . mouth rinse  15 mL Mouth Rinse BID  . pantoprazole  40 mg Oral Daily  . potassium chloride  40 mEq Oral Q4H  . [START ON 03/25/2019] potassium chloride  40 mEq Oral BID  . sacubitril-valsartan  1 tablet Oral BID  . sodium chloride flush  3 mL Intravenous Q12H  . spironolactone  25 mg Oral Daily  . [START ON 03/25/2019] torsemide  20 mg Oral BID  . warfarin  5 mg Oral q1800  . Warfarin - Physician Dosing Inpatient   Does not apply q1800   Continuous Infusions: . sodium chloride 10 mL/hr at 03/24/19 0700  . DOPamine Stopped (03/22/19 1915)  . furosemide (LASIX) infusion 8 mg/hr (03/24/19 0840)   PRN Meds: ALPRAZolam, Lidocaine (Anorectal), metoprolol tartrate, morphine injection, ondansetron (ZOFRAN) IV, oxyCODONE, sodium chloride flush, traMADol   Vital Signs    Vitals:   03/24/19 0400 03/24/19 0500 03/24/19 0600 03/24/19 0700  BP: (!) 141/89 135/87 (!) 150/93 (!) 142/84  Pulse: 76 80 84 80  Resp: 15 16 17 18   Temp: 98.3 F (36.8 C)   98.5 F (36.9 C)  TempSrc: Oral   Oral  SpO2: 94% 95% 94% 94%  Weight:  116.3 kg    Height:        Intake/Output Summary (Last 24 hours) at 03/24/2019 0911 Last data filed at 03/24/2019 0700 Gross per 24 hour  Intake 1295.47 ml  Output 6870 ml  Net -5574.53 ml   Last 3 Weights 03/24/2019 03/23/2019 03/22/2019   Weight (lbs) 256 lb 6.3 oz 277 lb 9 oz 263 lb 0.1 oz  Weight (kg) 116.3 kg 125.9 kg 119.3 kg      Telemetry    SR, long PR, RBBB - Personally Reviewed  ECG    No new tracing - Personally Reviewed  Physical Exam  Appears well GEN: No acute distress.   Neck: No JVD Cardiac: RRR, no murmurs, rubs, or gallops.  Respiratory: Clear to auscultation bilaterally. GI: Soft, nontender, non-distended  MS: mild generalized edema; No deformity. Neuro:  Nonfocal  Psych: Normal affect   Labs    Chemistry Recent Labs  Lab 03/22/19 2140 03/23/19 0355 03/24/19 0323  NA 135 136 136  K 4.2 4.1 3.4*  CL 104 106 98  CO2 24 23 29   GLUCOSE 140* 121* 119*  BUN 21 21 17   CREATININE 1.09 1.12 0.98  CALCIUM 8.2* 8.0* 8.3*  GFRNONAA >60 >60 >60  GFRAA >60 >60 >60  ANIONGAP 7 7 9      Hematology Recent Labs  Lab 03/22/19 2140 03/23/19 0355 03/24/19 0323  WBC 8.9 8.1 8.6  RBC 2.98* 2.91* 3.23*  HGB 9.4* 9.1* 10.2*  HCT 29.8* 28.8* 30.7*  MCV 100.0 99.0 95.0  MCH 31.5 31.3 31.6  MCHC 31.5  31.6 33.2  RDW 17.7* 17.4* 16.7*  PLT 59* 58* 72*    Cardiac EnzymesNo results for input(s): TROPONINI in the last 168 hours. No results for input(s): TROPIPOC in the last 168 hours.   BNPNo results for input(s): BNP, PROBNP in the last 168 hours.   DDimer No results for input(s): DDIMER in the last 168 hours.   Radiology    Dg Chest Port 1 View  Result Date: 03/23/2019 CLINICAL DATA:  Atelectasis EXAM: PORTABLE CHEST 1 VIEW COMPARISON:  Yesterday FINDINGS: Left IJ line has been removed from the sheath. Chest drains remain. There is a temporary pacer from below which overlaps the heart, tip obscured or not covered. Low volume chest with atelectasis that is improved. No visible pneumothorax. Cardiomegaly. Left atrial clipping and mitral valve replacement. IMPRESSION: 1. Remaining hardware in stable position. 2. New temporary pacer from below which is obscured distally. 3. Improved atelectasis.  Electronically Signed   By: Monte Fantasia M.D.   On: 03/23/2019 08:44    Cardiac Studies   12/09/2018 TTE 1. The left ventricle has moderately reduced systolic function of 96-29%. The cavity size was severely increased. There is mildly increased left ventricular wall thickness. Left ventricular diastology could not be evaluated Left ventricular diffuse  hypokinesis. 2. The right ventricle has moderately reduced systolic function. The cavity was normal. There is no increase in right ventricular wall thickness. 3. Left atrial size was severely dilated. 4. Right atrial size was severely dilated. 5. Possible flail segement of posterior MV leaflet. Mitral valve regurgitation is moderate by color flow Doppler. 6. The tricuspid valve is normal in structure. 7. The aortic valve is tricuspid There is mild thickening of the aortic valve. 8. The pulmonic valve was normal in structure. 9. There is mild dilatation of the aortic root and ascending aorta. 10. Moderate global reduction in LV systolic function; severe LVE; severe biatrial enlargement; moderate RV dysfunction; possible flail segment of posterior MV leaflet; eccentric MR difficult to quantitate; suggest TEE to further assess.  01/03/2019 TEE 1. The left ventricle has moderately reduced systolic function, with an ejection fraction of 35-40%. 2. The right ventricle has moderately reduced systolic function. 3. Left atrial size was severely dilated. 4. Normal LA appendage with no evidence of thrombus. 5. Right atrial size was severely dilated. 6. There is severe myoxmatous degeneration of the posterior mitral valve leafelt with severe prolapse of the P2 component of the posterior leaflet. There appears to be a ruptured chordae tendinae with partial flail leaflet. There is moderate to severe  eccentric mitral regurgitation directed towards the interatrial septum and wraps around the LA to the posterior wall and mouth of the  pulmonary vein but no flow reversal in the pulmonary vein by doppler. 7. The tricuspid valve was normal in structure with trivial TR. 8. The aortic valve is tricuspid. 9. The pulmonic valve was normal in structure with trivial PR. 10. The aortic root and ascending aorta are normal in size and structure. 11. Normal interatrial septum with no evidence of shunt by colorflow doppler.   Patient Profile     63 y.o. male s/p minimally invasive MV repair and Maze 03/21/2019 for MVP/flail posterior leaflet with severe MR and HF with reduced LV systolic function, Afib; post op course complicated by severe bradycardia and briefly required transvenous TPW.  Assessment & Plan    Appears to be consistently in NSR, with very long AV conduction time. No atrial fibrillation. Making good progress on diuresis.  For questions  or updates, please contact Paulina Please consult [email or web address] for contact info under        Signed, Sanda Klein, MD  03/24/2019, 9:11 AM

## 2019-03-24 NOTE — Progress Notes (Addendum)
      [address]       [place] [address]             [phone]        CARDIOTHORACIC SURGERY PROGRESS NOTE   R2 Days Post-Op Procedure(s) (LRB): TEMPORARY PACEMAKER (N/A)  Subjective: Feels fine.  Mild soreness.  No SOB  Objective: Vital signs: BP Readings from Last 1 Encounters:  03/24/19 (!) 142/84   Pulse Readings from Last 1 Encounters:  03/24/19 80   Resp Readings from Last 1 Encounters:  03/24/19 18   Temp Readings from Last 1 Encounters:  03/24/19 98.5 F (36.9 C) (Oral)    Hemodynamics:    Physical Exam:  Rhythm:   Sinus vs junctional HR 80's - no further episodes bradycardia  Breath sounds: clear  Heart sounds:  RRR  Incisions:  Clean and dry  Abdomen:  Soft, non-distended, non-tender  Extremities:  Warm, well-perfused  Chest tubes:  decreasing volume thin serosanguinous output, no air leak    Intake/Output from previous day: 05/21 0701 - 05/22 0700 In: 1597 [P.O.:960; I.V.:637] Out: 7322 [GURKY:7062; Chest Tube:455] Intake/Output this shift: No intake/output data recorded.  Lab Results:  CBC: Recent Labs    03/23/19 0355 03/24/19 0323  WBC 8.1 8.6  HGB 9.1* 10.2*  HCT 28.8* 30.7*  PLT 58* 72*    BMET:  Recent Labs    03/23/19 0355 03/24/19 0323  NA 136 136  K 4.1 3.4*  CL 106 98  CO2 23 29  GLUCOSE 121* 119*  BUN 21 17  CREATININE 1.12 0.98  CALCIUM 8.0* 8.3*     PT/INR:   Recent Labs    03/24/19 0323  LABPROT 14.8  INR 1.2    CBG (last 3)  Recent Labs    03/23/19 1215 03/23/19 1559 03/23/19 2008  GLUCAP 133* 132* 127*    ABG    Component Value Date/Time   PHART 7.389 03/21/2019 1930   PCO2ART 38.1 03/21/2019 1930   PO2ART 68.0 (L) 03/21/2019 1930   HCO3 22.9 03/21/2019 1930   TCO2 23 03/21/2019 2052   ACIDBASEDEF 2.0 03/21/2019 1930   O2SAT 69.1 03/23/2019 0412    CXR: n/a  Assessment/Plan: S/P Procedure(s) (LRB): TEMPORARY PACEMAKER (N/A)  Clinically stable overnight  Underlying rhythm has recovered, now maintaining what is either junctional rhythm or sinus w/ long 1st degree AVB w/ HR 80's, BP stable Breathing comfortably w/ O2 sats 100% on room air Chronic diastolic CHF with expected post-op volume excess, weight down 10 kg but still reportedly 7 kg > preop Expected post op acute blood loss anemia, stable Post op thrombocytopenia, platelet count improved 72k Hypokalemia, induced by loop diuretics   D/C temporary wire (done)  Mobilize out of bed  Restart Entresto  Continue lasix drip today but D/C drip and foley this evening  Continue Coumadin increase dose  Keep chest tubes in for now   Rexene Alberts, MD 03/24/2019 8:08 AM

## 2019-03-24 NOTE — Discharge Summary (Signed)
Physician Discharge Summary  Patient ID: [name] MRN: [id number] DOB/AGE: 06/04/1956 63 y.o.  Admit date: 03/21/2019 Discharge date: 03/28/2019  Admission Diagnoses: Severe mitral regurgitation and chronic persistent atrial fibrillation  Discharge Diagnoses:  Principal Problem:   S/P minimally invasive mitral valve repair + maze procedure Active Problems:   Severe mitral regurgitation   Persistent atrial fibrillation   Chronic systolic (congestive) heart failure (HCC)   Mitral valve prolapse   S/P Maze operation for atrial fibrillation   AV junctional bradycardia  Patient Active Problem List   Diagnosis Date Noted  . AV junctional bradycardia 03/22/2019  . S/P minimally invasive mitral valve repair + maze procedure 03/21/2019  . S/P Maze operation for atrial fibrillation 03/21/2019  . Dilated aortic root ([place]) 01/04/2019  . Dilated cardiomyopathy ([place]) 01/04/2019  . Atrial flutter with rapid ventricular response ([place])   . Mitral valve prolapse   . Hypertensive urgency 12/30/2018  . Acute on chronic systolic (congestive) heart failure ([place]) 12/30/2018  . Acute systolic heart failure ([place]) 12/30/2018  . Chronic systolic (congestive) heart failure ([place])   . Persistent atrial fibrillation 12/02/2018  . Right hamstring muscle strain 07/20/2018  . Essential hypertension 02/07/2016  . Severe mitral regurgitation   . Annual physical exam 11/16/2014  . Arthritis of left knee 10/23/2014  . Status post total left knee replacement 10/23/2014  . Arthritis of knee, right 01/19/2014  . Status post total knee replacement 01/19/2014  . Benign paroxysmal positional vertigo 10/30/2013  . Testicular hypofunction 03/24/2012   HPI: at time of consultation  Patient is a 63 year old male with known history of mitral valve prolapse and mitral regurgitation, hypertension, and degenerative arthritis who was admitted to the hospital in rapid atrial fibrillation and atrial flutter with  decompensated acute on chronic combined systolic and diastolic congestive heart failure and has been referred for surgical consultation to discuss treatment options for management of mitral regurgitation and atrial fibrillation.  Patient states that he was first noted to have a heart murmur on physical exam by his primary care physician more than 10 years ago. He was diagnosed with mitral valve prolapse and has been followed intermittently by Dr. Gwenlyn Found ever since. Echocardiogram performed June 2019 revealed severe mitral valve prolapse with moderate mitral regurgitation. Left ventricular systolic function was normal with ejection fraction estimated 60 to 65%.  Several months ago the patient began to experience increased fatigue, exertional shortness of breath, anorexia, and progressive weight gain with worsening lower extremity edema. He was evaluated by his primary care physician and notably tachycardic and fluid overloaded. He was started on oral diuretics and referred to Dr. Alvester Chou who saw him December 02, 2018. At that time he was in rapid atrial fibrillation with heart rate 130. He was started on Eliquis and metoprolol dose increased. A follow-up echocardiogram was performed December 09, 2018 which revealed moderate to severe left ventricular systolic dysfunction with ejection fraction estimated only 35 to 40%. There was felt to be moderate mitral regurgitation although question of flail segment of the posterior leaflet. He returned to see Dr. Gwenlyn Found in follow-up and had gained another 20 pounds in weight and appearedshort of breathin acutely decompensated congestive heart failure. He was sent directly to the hospital for admission where he was found to be in atrial flutter. He responded well to intravenous diuresis and underwent TEE with DC cardioversion.TEE confirmed the presence of mitral valve prolapse with ruptured chordae tendinaeand a flail segment of the posterior leaflet. There is  severe mitral regurgitation.  Left ventricular ejection fraction was estimated 30 to 35%. There is moderately reduced right ventricular function. Severely dilated left and right atrium. No left atrial thrombus. The aortic valve was trileaflet and otherwise normal. The patient underwent cardioversion back into sinus rhythm. Cardiothoracic surgical consultation was requested.  I had the opportunity to see him in consultation on January 03, 2019. I last spoke with him over the telephone on Mar 06, 2019. Since then the patient underwent diagnostic cardiac catheterization and he returns to the office today with tentative plans to proceed with elective minimally invasive mitral valve repair and Maze procedure in the operating room tomorrow. He describes stable symptoms of exertional shortness of breath. He denies resting shortness of breath, PND, orthopnea, palpitations, dizzy spells, or syncope. Routine preoperative EKG revealed the patient is currently in rate controlled persistent atrial fibrillation. He was unaware that he had gone back out of rhythm. He has been practicing social distancing recommendations per guidelines and he denies any recent fevers, chills, worsening shortness of breath, or cough. He is eager to proceed with surgery as previously planned.  Patient is married and lives locally in [place] with his wife. 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 He does not exercise on a regular basis. He has had problems with degenerative arthritis which ultimately led to right hip replacement and bilateral total knee replacements. However, he recovered from those procedures quite well and reports no physical limitations recently. Several months ago he had problems with pain in his right leg which ultimately was attributed to a cystin his spine that was successfully injected. He reports that immediately prior to hospital admission he was short of breath with  low-level activity and occasionally at rest. He had severe lower extremity edema. All of this has improved considerably over the last few days. He has never had any chest pain or chest tightness. He has not had any dizzy spells or syncope. He has recently had a dry nonproductive cough.   Discharged Condition: good  Hospital Course: The patient has overall done well.  He initially did require temporary pacemaker which was weaned off initially during postoperative day #1.  However on evening rounds on 03/22/2019 he developed complete heart block with a heart rate into the 30s and felt dizzy and diaphoretic.  Attempts were made to pace with the epicardial pacing wires were unsuccessful and this required placement of a temporary transvenous pacer performed by Dr. Daneen Schick in the Cath Lab.  Over the next day his rhythm stabilized to sinus rhythm and ultimately this was able to be removed occultly.  He additionally had some postoperative atrial fibrillation but this was subsequently chemically cardioverted to sinus rhythm with amiodarone and low-dose beta-blocker also initiated once his heart rate was stabilized.  All routine lines, monitors and other drainage devices have all been discontinued in the standard fashion.  Additionally, oxygen was weaned without difficulty and he has shown a good and gradual improvement in his postoperative recovery using standard cardiac rehab modalities.  He has been started on Coumadin at time of discharge his INR was 1.5 with plans to discharge on 5 mg of Coumadin and recheck INR in the Coumadin clinic this Thursday.  He did have some postoperative volume overload which has responded to [facility] and at time of discharge he will resume his home dosing of 20 mg daily 10 mEq daily of potassium.  His renal function is in normal range and most recent BUN and and creatinine are  18/1.00.  His blood sugars have been under good control and he has no history of diabetes.  He has an  expected acute blood loss anemia and values have stabilized with most recent hemoglobin and hematocrit 10.2/30.7.  At the time of discharge the patient is felt to be quite stable.  Consults: cardiology  Significant Diagnostic Studies: routine serial post-op labs and CXR's  Treatments: surgery:  CARDIOTHORACIC SURGERY OPERATIVE NOTE  Date of Procedure:                            03/21/2019  Preoperative Diagnosis:         Severe Mitral Regurgitation  Recurrent Persistent Atrial Fibrillation  Postoperative Diagnosis:    Same  Procedure:        Minimally-Invasive Mitral Valve Repair             Complex valvuloplasty including triangular resection of posterior leaflet             Artificial Gore-tex neochord placement x4             Sorin Memo 4D Ring Annuloplasty (size 72mm, catalog #[id number], serial [id number])   Minimally-Invasive Maze Procedure             Complete bilateral atrial lesion set using cryothermy and bipolar radiofrequency ablation             Clipping of Left Atrial Appendage (Atricure Pro245 left atrial clip, size 45 mm)  Surgeon:        Valentina Gu. Roxy Manns, MD  Assistant:       John Giovanni, PA-C  Anesthesia:    [name], MD  Operative Findings: ? Fibroelastic deficiency type mxyomatous degenerative disease ? Multiple ruptured primary chordae tendinae with flail segment (P2) of posterior leaflet ? Type II mitral valve dysfunction with severe mitral regurgitation ? Dilated left ventricle with mild left ventricular systolic dysfunction ? Dilated right ventricle with low normal right ventricular function ? Severe left atrial enlargement ? No residual mitral regurgitation after successful valve repair Discharge Exam: Blood pressure 114/76, pulse 85, temperature 98.2 F (36.8 C), temperature source Oral, resp. rate (!) 24, height 6\' 3"  (1.905 m), weight 109.9 kg, SpO2 94 %.  General appearance: alert, cooperative and no distress Heart: regular  rate and rhythm and no rub or murmur Lungs: min dimin in right base, O/W clear Abdomen: benign Extremities: min edema Wound: incis healing well   Disposition: Discharge disposition: 01-Home or Self Care       Discharge Instructions    Amb Referral to Cardiac Rehabilitation   Complete by:  As directed    Diagnosis:   Valve Repair Other     Valve:  Mitral   After initial evaluation and assessments completed: Virtual Based Care may be provided alone or in conjunction with Phase 2 Cardiac Rehab based on patient barriers.:  Yes   Discharge patient   Complete by:  As directed    Discharge disposition:  01-Home or Self Care   Discharge patient date:  03/27/2019     Allergies as of 03/27/2019   No Known Allergies     Medication List    STOP taking these medications   ALPRAZolam 0.5 MG tablet Commonly known as:  XANAX   diphenhydrAMINE 25 MG tablet Commonly known as:  SOMINEX   HYDROcodone-acetaminophen 10-325 MG tablet Commonly known as:  NORCO     TAKE these medications   amiodarone 200 MG tablet  Commonly known as:  PACERONE Take 1 tablet (200 mg total) by mouth daily.   aspirin 81 MG EC tablet Take 1 tablet (81 mg total) by mouth daily.   diltiazem 2 % Gel Apply 1 application topically 3 (three) times daily. With 5 % lidocaine What changed:    when to take this  reasons to take this   docusate sodium 100 MG capsule Commonly known as:  COLACE Take 300 mg by mouth daily.   ferrous [id number] C-folic acid capsule Commonly known as:  TRINSICON / FOLTRIN Take 1 capsule by mouth daily with breakfast.   Klor-Con M10 10 MEQ tablet Generic drug:  potassium chloride Take 10 mEq by mouth 2 (two) times daily.   metoprolol tartrate 25 MG tablet Commonly known as:  LOPRESSOR Take 0.5 tablets (12.5 mg total) by mouth 2 (two) times daily. What changed:    medication strength  how much to take   multivitamin with minerals Tabs tablet Take 1 tablet  by mouth daily.   sacubitril-valsartan 24-26 MG Commonly known as:  ENTRESTO Take 1 tablet by mouth 2 (two) times daily.   spironolactone 25 MG tablet Commonly known as:  ALDACTONE Take 1 tablet (25 mg total) by mouth daily.   torsemide 20 MG tablet Commonly known as:  DEMADEX Take 1 tablet (20 mg total) by mouth daily.      Follow-up Information    Rexene Alberts, MD Follow up.   Specialty:  Cardiothoracic Surgery Why:  see discharge paperwork for f/u appointments  Contact information: [address] [place] [address] [phone]        Lorretta Harp, MD Follow up.   Specialties:  Cardiology, Radiology Why:  Please see discharge paperwork for follow-up appointments with cardiology as well as Coumadin clinic Contact information: [place] [address] [place], [place], [place] Follow up.   Specialties:  Physician Assistant, Cardiology, Radiology Why:  [facility] - you are scheduled for a VIRTUAL office visit 6/4 at 10am with [place], a PA with our team. This is not physically in the office. You will be called by phone 15 minutes prior to appointment. See end of this document for instructions. Contact information: [address] [place] [address] [phone]          The patient has been discharged on:   1.Beta Blocker:  Yes y[   ]                              No   [   ]                              If No, reason:  2.Ace Inhibitor/ARB: Yes Blue.[email or web address]   ]                                     No  [    ]                                     If No, reason:  3.Statin:   Yes [   ]  No  [  n ]                  If No, reason:no indication  4.Ecasa:  Yes  Blue.Reese   ]                  No   [   ]                  If No, reason:  Signed: Wilder Glade Whitney 03/28/2019, 9:10 AM

## 2019-03-25 LAB — BASIC METABOLIC PANEL
Anion gap: 8 (ref 5–15)
BUN: 15 mg/dL (ref 8–23)
CO2: 30 mmol/L (ref 22–32)
Calcium: 8.4 mg/dL — ABNORMAL LOW (ref 8.9–10.3)
Chloride: 97 mmol/L — ABNORMAL LOW (ref 98–111)
Creatinine, Ser: 0.96 mg/dL (ref 0.61–1.24)
GFR calc Af Amer: >60 mL/min (ref >60)
GFR calc non Af Amer: >60 mL/min (ref >60)
Glucose, Bld: 111 mg/dL — ABNORMAL HIGH (ref 70–99)
Potassium: 3.5 mmol/L (ref 3.5–5.1)
Sodium: 135 mmol/L (ref 135–145)

## 2019-03-25 LAB — MAGNESIUM: Magnesium: 2.2 mg/dL (ref 1.7–2.4)

## 2019-03-25 LAB — PROTIME-INR
INR: 1.2 (ref 0.8–1.2)
Prothrombin Time: 15 seconds (ref 11.4–15.2)

## 2019-03-25 MED ORDER — AMIODARONE HCL 200 MG PO TABS
200.0000 mg | ORAL_TABLET | Freq: Every day | ORAL | Status: DC
  Administered 2019-03-25 – 2019-03-27 (×3): 200 mg via ORAL
  Filled 2019-03-25 (×3): qty 1

## 2019-03-25 MED ORDER — POTASSIUM CHLORIDE CRYS ER 20 MEQ PO TBCR
40.0000 meq | EXTENDED_RELEASE_TABLET | ORAL | Status: AC
  Administered 2019-03-25 (×2): 40 meq via ORAL
  Filled 2019-03-25 (×2): qty 2

## 2019-03-25 MED ORDER — FE FUMARATE-B12-VIT C-FA-IFC PO CAPS
1.0000 | ORAL_CAPSULE | Freq: Every day | ORAL | Status: DC
  Administered 2019-03-25 – 2019-03-27 (×3): 1 via ORAL
  Filled 2019-03-25 (×3): qty 1

## 2019-03-25 MED ORDER — POTASSIUM CHLORIDE CRYS ER 20 MEQ PO TBCR
40.0000 meq | EXTENDED_RELEASE_TABLET | Freq: Two times a day (BID) | ORAL | Status: DC
  Administered 2019-03-26 – 2019-03-27 (×3): 40 meq via ORAL
  Filled 2019-03-25 (×3): qty 2

## 2019-03-25 MED ORDER — PROMETHAZINE HCL 25 MG PO TABS: 12.5000 mg | ORAL_TABLET | Freq: Three times a day (TID) | ORAL | Status: DC | PRN

## 2019-03-25 MED ORDER — PROMETHAZINE HCL 25 MG/ML IJ SOLN: 12.5000 mg | Freq: Four times a day (QID) | INTRAMUSCULAR | Status: DC | PRN

## 2019-03-25 NOTE — Progress Notes (Signed)
    Episode of artifact on tele.  Otherwise NSR with first degree AV block.  We will follow to assist with discharge planning

## 2019-03-25 NOTE — Progress Notes (Addendum)
      [address]       [place] [address]             [phone]        CARDIOTHORACIC SURGERY PROGRESS NOTE  R4 Days Post-Op Procedure(s) (LRB): MINIMALLY INVASIVE MITRAL VALVE REPAIR (MVR) using Memo 4D ring size 36 (Right) MINIMALLY INVASIVE MAZE PROCEDURE (N/A) TRANSESOPHAGEAL ECHOCARDIOGRAM (TEE) (N/A) Clipping Of Atrial Appendage using 13mm Atricure Pro2 Clip  Subjective: Feels great.  Slept well last night  Objective: Vital signs: BP Readings from Last 1 Encounters:  03/25/19 130/86   Pulse Readings from Last 1 Encounters:  03/25/19 88   Resp Readings from Last 1 Encounters:  03/25/19 (!) 28   Temp Readings from Last 1 Encounters:  03/25/19 98.3 F (36.8 C) (Oral)    Hemodynamics:    Physical Exam:  Rhythm:   sinus  Breath sounds: clear  Heart sounds:  RRR w/out murmur  Incisions:  Clean and dry  Abdomen:  Soft, non-distended, non-tender  Extremities:  Warm, well-perfused  Chest tubes:  low volume thin serosanguinous output, no air leak    Intake/Output from previous day: 05/22 0701 - 05/23 0700 In: 851.9 [P.O.:720; I.V.:131.9] Out: 2770 [Urine:2650; Chest Tube:120] Intake/Output this shift: No intake/output data recorded.  Lab Results:  CBC: Recent Labs    03/23/19 0355 03/24/19 0323  WBC 8.1 8.6  HGB 9.1* 10.2*  HCT 28.8* 30.7*  PLT 58* 72*    BMET:  Recent Labs    03/24/19 0323 03/25/19 0301  NA 136 135  K 3.4* 3.5  CL 98 97*  CO2 29 30  GLUCOSE 119* 111*  BUN 17 15  CREATININE 0.98 0.96  CALCIUM 8.3* 8.4*     PT/INR:   Recent Labs    03/25/19 0301  LABPROT 15.0  INR 1.2    CBG (last 3)  Recent Labs    03/23/19 1215 03/23/19 1559 03/23/19 2008  GLUCAP 133* 132* 127*    ABG    Component Value Date/Time   PHART 7.389 03/21/2019 1930   PCO2ART 38.1 03/21/2019 1930   PO2ART 68.0 (L) 03/21/2019 1930   HCO3 22.9 03/21/2019 1930   TCO2 23 03/21/2019 2052   ACIDBASEDEF 2.0 03/21/2019  1930   O2SAT 69.1 03/23/2019 0412    CXR: n/a  Assessment/Plan:  Doing well now POD4 Maintaining NSR w/ stable BP Breathing comfortably on room air Chronic diastolic CHF with expected post-op volume excess,diuresing well, weight still reportedly 3 kg > preop Expected post op acute blood loss anemia,stable Post op thrombocytopenia,platelet count improved 72k yesterday Hypokalemia, induced by loop diuretics   Mobilize  D/C chest tubes  Resume torsemide bid for now  Continue spironolactone  Resume amiodarone but continue to hold beta blockers  Continue Entresto  Check 12 lead EKG - recheck AV block and QTc  Continue Coumadin  Transfer stepdown  Anticipate possible d/c home by Monday if rhythm stable   Rexene Alberts, MD 03/25/2019 8:47 AM

## 2019-03-25 NOTE — Progress Notes (Signed)
Cardiac Rehab Advisory Cardiac Rehab Phase I is not seeing pts face to face at this time due to Covid 19 restrictions. Ambulation is occurring through nursing, PT, and mobility teams. We will help facilitate that process as needed. We are calling pts in their rooms and discussing education. We will then deliver education materials to pts RN for delivery to pt.    Education materials given to patient by patient's RN, but I was unable to reach patient via telephone (line busy after multiple attempts to call). Education done in room, social distance maintained throughout.  Discussed incision care, restrictions, IS use, daily weights, s/sx's and when to call 911 vs physician. Discussed heart healthy and low sodium diet, and patient is very knowledgeable about heart healthy eating, monitoring salt intake and reading food labels since his previous hospitalization in February for heart failure. Reviewed exercise guidelines with patient. Patient has had bilateral knee replacement, right hip replacement, and left ankle fusion, but states this doesn't hinder his mobility. Pt is receptive to information provided and verbalizes understanding. Discussed Phase 2 Cardiac Rehab, and patient is interested, referral will be sent to program at [facility].   Sol Passer, MS, ACSM CEP [phone]

## 2019-03-25 NOTE — Progress Notes (Signed)
Pt arrived to 4E-08 via wheelchair from [facility] via SWOT. Pt transferred to chair. Pt given CHG bath. Tele applied, CCMD notified. VSS. Pt oriented to call bell and room. Call bell within reach. Will continue to monitor.  Amanda Cockayne, RN

## 2019-03-26 ENCOUNTER — Inpatient Hospital Stay (HOSPITAL_COMMUNITY): Payer: BC Managed Care – PPO

## 2019-03-26 LAB — BASIC METABOLIC PANEL
Anion gap: 11 (ref 5–15)
BUN: 18 mg/dL (ref 8–23)
CO2: 30 mmol/L (ref 22–32)
Calcium: 8.6 mg/dL — ABNORMAL LOW (ref 8.9–10.3)
Chloride: 96 mmol/L — ABNORMAL LOW (ref 98–111)
Creatinine, Ser: 1 mg/dL (ref 0.61–1.24)
GFR calc Af Amer: >60 mL/min (ref >60)
GFR calc non Af Amer: >60 mL/min (ref >60)
Glucose, Bld: 106 mg/dL — ABNORMAL HIGH (ref 70–99)
Potassium: 3.9 mmol/L (ref 3.5–5.1)
Sodium: 137 mmol/L (ref 135–145)

## 2019-03-26 LAB — PROTIME-INR
INR: 1.3 — ABNORMAL HIGH (ref 0.8–1.2)
Prothrombin Time: 16.2 seconds — ABNORMAL HIGH (ref 11.4–15.2)

## 2019-03-26 MED ORDER — METOPROLOL TARTRATE 12.5 MG HALF TABLET
12.5000 mg | ORAL_TABLET | Freq: Two times a day (BID) | ORAL | Status: DC
  Administered 2019-03-26 – 2019-03-27 (×3): 12.5 mg via ORAL
  Filled 2019-03-26 (×3): qty 1

## 2019-03-26 MED ORDER — WARFARIN SODIUM 7.5 MG PO TABS
7.5000 mg | ORAL_TABLET | Freq: Every day | ORAL | Status: DC
  Administered 2019-03-26: 7.5 mg via ORAL
  Filled 2019-03-26: qty 1

## 2019-03-26 NOTE — Progress Notes (Addendum)
[address]       [place] [address]             [phone]      4 Days Post-Op Procedure(s) (LRB): TEMPORARY PACEMAKER (N/A) Subjective: Feels pretty well overall  Objective: Vital signs in last 24 hours: Temp:  [98.3 F (36.8 C)-99.3 F (37.4 C)] 98.4 F (36.9 C) (05/24 0433) Pulse Rate:  [83-93] 85 (05/24 0008) Cardiac Rhythm: Normal sinus rhythm;Bundle branch block (05/23 1900) Resp:  [14-28] 19 (05/24 0433) BP: (96-131)/(63-95) 129/82 (05/24 0433) SpO2:  [93 %-100 %] 96 % (05/24 0433) Weight:  [113.3 kg] 113.3 kg (05/24 0500)  Hemodynamic parameters for last 24 hours:    Intake/Output from previous day: 05/23 0701 - 05/24 0700 In: 540 [P.O.:540] Out: 730 [Urine:700; Chest Tube:30] Intake/Output this shift: No intake/output data recorded.  General appearance: alert, cooperative and no distress Heart: regular rate and rhythm, no rub and no murmur Lungs: clear to auscultation bilaterally Abdomen: benign Extremities: no edema Wound: incis healing well  Lab Results: Recent Labs    03/24/19 0323  WBC 8.6  HGB 10.2*  HCT 30.7*  PLT 72*   BMET:  Recent Labs    03/25/19 0301 03/26/19 0423  NA 135 137  K 3.5 3.9  CL 97* 96*  CO2 30 30  GLUCOSE 111* 106*  BUN 15 18  CREATININE 0.96 1.00  CALCIUM 8.4* 8.6*    PT/INR:  Recent Labs    03/26/19 0423  LABPROT 16.2*  INR 1.3*   ABG    Component Value Date/Time   PHART 7.389 03/21/2019 1930   HCO3 22.9 03/21/2019 1930   TCO2 23 03/21/2019 2052   ACIDBASEDEF 2.0 03/21/2019 1930   O2SAT 69.1 03/23/2019 0412   CBG (last 3)  Recent Labs    03/23/19 1215 03/23/19 1559 03/23/19 2008  GLUCAP 133* 132* 127*    Meds Scheduled Meds: . acetaminophen  1,000 mg Oral Q6H  . amiodarone  200 mg Oral Daily  . aspirin EC  81 mg Oral Daily  . bisacodyl  10 mg Oral Daily   Or  . bisacodyl  10 mg Rectal Daily  . Chlorhexidine Gluconate Cloth  6 each Topical Daily  . Diltiazem  cream/lidocaine ointment (patient's own medication)  1 application Rectal TID  . docusate sodium  200 mg Oral Daily  . enoxaparin (LOVENOX) injection  40 mg Subcutaneous Daily  . ferrous [id number] C-folic acid  1 capsule Oral Q breakfast  . mouth rinse  15 mL Mouth Rinse BID  . pantoprazole  40 mg Oral Daily  . potassium chloride  40 mEq Oral BID  . sacubitril-valsartan  1 tablet Oral BID  . sodium chloride flush  3 mL Intravenous Q12H  . spironolactone  25 mg Oral Daily  . torsemide  20 mg Oral BID  . warfarin  5 mg Oral q1800  . Warfarin - Physician Dosing Inpatient   Does not apply q1800   Continuous Infusions: . sodium chloride Stopped (03/24/19 0928)   PRN Meds:.ALPRAZolam, Lidocaine (Anorectal), metoprolol tartrate, morphine injection, oxyCODONE, promethazine, sodium chloride flush, traMADol  Xrays Dg Chest 2 View  Result Date: 03/26/2019 CLINICAL DATA:  63 year old male with a history of atelectasis EXAM: CHEST - 2 VIEW COMPARISON:  03/23/2019, 03/22/2019 FINDINGS: Cardiomediastinal silhouette unchanged in size and contour. Surgical changes of left atrial clipping/amputation. Interval removal of right-sided thoracostomy tubes with no pneumothorax. Left IJ central line has been removed. Defibrillator pads  have been removed. Epicardial pacing leads have been removed. Linear opacity in the right mid lung persists. No new confluent airspace disease, pneumothorax, pleural effusion, or edema. IMPRESSION: Interval removal of thoracostomy tubes with no pneumothorax. Lung volumes are low with linear atelectasis/scarring, with potentially fluid in the minor fissure. Interval removal of left IJ catheter, epicardial pacing leads, defibrillator pads Electronically Signed   By: Corrie Mckusick D.O.   On: 03/26/2019 08:08    Assessment/Plan: S/P Procedure(s) (LRB): TEMPORARY PACEMAKER (N/A)  1 doing well POD#5 2 sinus rhythm, freq PVC's overnight with short episode of aflutter. QTc is  517. He has clinical sleep apnea and plans to f/u with PMD to arrange sleep study as may need CPAP. conts amio at 200mg  q day. May be able to tolerate  low dose beta blocker at this point.  3 conts diuretics, weight stable but UOP not recorded accurately. Renal fxn/ GFR in normal range. No metabolic alkalosis. No significant edema noted.On torsemide BID and spironolactone 4 INR slow to rise 1.3 today, may need to increase dose if doesn't increase soon 5 conts entresto with reduced biventricular fxn.  6 BS controlled 7 CXR improved atx and ASD  LOS: 5 days    John Giovanni  [facility] 03/26/2019 Pager [phone]  I have seen and examined the patient and agree with the assessment and plan as outlined.  Will try resuming metoprolol at reduced dose.  Continue amiodarone.  Increase Coumadin 7.5 mg tonight.  Tentatively plan d/c home tomorrow.    Rexene Alberts, MD 03/26/2019 10:31 AM

## 2019-03-27 ENCOUNTER — Telehealth: Payer: Self-pay | Admitting: Physician Assistant

## 2019-03-27 ENCOUNTER — Other Ambulatory Visit (HOSPITAL_COMMUNITY): Payer: Self-pay | Admitting: Surgical

## 2019-03-27 LAB — PROTIME-INR
INR: 1.5 — ABNORMAL HIGH (ref 0.8–1.2)
Prothrombin Time: 17.8 seconds — ABNORMAL HIGH (ref 11.4–15.2)

## 2019-03-27 MED ORDER — OXYCODONE HCL 5 MG PO TABS: 5.0000 mg | ORAL_TABLET | Freq: Four times a day (QID) | ORAL | 0 refills | Status: DC | PRN

## 2019-03-27 MED ORDER — METOPROLOL TARTRATE 25 MG PO TABS: 12.5000 mg | ORAL_TABLET | Freq: Two times a day (BID) | ORAL | 1 refills | Status: DC

## 2019-03-27 MED ORDER — WARFARIN SODIUM 5 MG PO TABS: 5.0000 mg | ORAL_TABLET | Freq: Every day | ORAL | Status: DC

## 2019-03-27 MED ORDER — WARFARIN SODIUM 5 MG PO TABS: 5.0000 mg | ORAL_TABLET | Freq: Every day | ORAL | 1 refills | Status: DC

## 2019-03-27 MED ORDER — ASPIRIN 81 MG PO TBEC: 81.0000 mg | DELAYED_RELEASE_TABLET | Freq: Every day | ORAL | Status: DC

## 2019-03-27 MED ORDER — OXYCODONE HCL 5 MG PO TABS: 5.0000 mg | ORAL_TABLET | Freq: Four times a day (QID) | ORAL | 0 refills | Status: AC | PRN

## 2019-03-27 MED ORDER — FE FUMARATE-B12-VIT C-FA-IFC PO CAPS: 1.0000 | ORAL_CAPSULE | Freq: Every day | ORAL | 1 refills | Status: DC

## 2019-03-27 NOTE — Progress Notes (Addendum)
CARDIOLOGY RECOMMENDATIONS:  Discharge is anticipated in the next 48 hours. Post MV repair, mini, LAA clipping  Feels well Vitals reviewed RRR (prolonged PR, Occasional PVC's, trigeminy on tele, one triplet) No AFIB  MV repair PAF- AMIO (no further) Cardiomyopathy - Entresto, diuresis  Recommendations for medications and follow up:  Discharge Medications: Continue medications as they are currently listed in the [facility]. Exceptions to the above:  None, agree with Demadex  Follow Up: The patient's Primary Cardiologist is Quay Burow, MD   Follow up in the office in 2 week(s). Notified Melina Copa, PA to sched.   Signed,  Candee Furbish, MD  9:04 AM 03/27/2019  [facility]

## 2019-03-27 NOTE — Progress Notes (Signed)
Rx sent to [facility] as his was closed for [employer] day  John Giovanni, [place]

## 2019-03-27 NOTE — Progress Notes (Addendum)
[address]       [place] [address]             [phone]      5 Days Post-Op Procedure(s) (LRB): TEMPORARY PACEMAKER (N/A) Subjective: Feels well  Objective: Vital signs in last 24 hours: Temp:  [98.2 F (36.8 C)-99.8 F (37.7 C)] 98.2 F (36.8 C) (05/25 0226) Pulse Rate:  [73-95] 73 (05/25 0226) Cardiac Rhythm: Normal sinus rhythm (05/25 0700) Resp:  [13-23] 13 (05/25 0414) BP: (103-130)/(63-83) 130/83 (05/25 0226) SpO2:  [94 %-99 %] 94 % (05/25 0226) Weight:  [109.9 kg] 109.9 kg (05/25 0414)  Hemodynamic parameters for last 24 hours:    Intake/Output from previous day: 05/24 0701 - 05/25 0700 In: 720 [P.O.:720] Out: 2200 [Urine:2200] Intake/Output this shift: No intake/output data recorded.  General appearance: alert, cooperative and no distress Heart: regular rate and rhythm and no rub or murmur Lungs: min dimin in right base, O/W clear Abdomen: benign Extremities: min edema Wound: incis healing well  Lab Results: No results for input(s): WBC, HGB, HCT, PLT in the last 72 hours. BMET:  Recent Labs    03/25/19 0301 03/26/19 0423  NA 135 137  K 3.5 3.9  CL 97* 96*  CO2 30 30  GLUCOSE 111* 106*  BUN 15 18  CREATININE 0.96 1.00  CALCIUM 8.4* 8.6*    PT/INR:  Recent Labs    03/27/19 0358  LABPROT 17.8*  INR 1.5*   ABG    Component Value Date/Time   PHART 7.389 03/21/2019 1930   HCO3 22.9 03/21/2019 1930   TCO2 23 03/21/2019 2052   ACIDBASEDEF 2.0 03/21/2019 1930   O2SAT 69.1 03/23/2019 0412   CBG (last 3)  No results for input(s): GLUCAP in the last 72 hours.  Meds Scheduled Meds: . amiodarone  200 mg Oral Daily  . aspirin EC  81 mg Oral Daily  . bisacodyl  10 mg Oral Daily   Or  . bisacodyl  10 mg Rectal Daily  . Chlorhexidine Gluconate Cloth  6 each Topical Daily  . Diltiazem cream/lidocaine ointment (patient's own medication)  1 application Rectal TID  . docusate sodium  200 mg Oral Daily  . ferrous  [id number] C-folic acid  1 capsule Oral Q breakfast  . mouth rinse  15 mL Mouth Rinse BID  . metoprolol tartrate  12.5 mg Oral BID  . pantoprazole  40 mg Oral Daily  . potassium chloride  40 mEq Oral BID  . sacubitril-valsartan  1 tablet Oral BID  . sodium chloride flush  3 mL Intravenous Q12H  . spironolactone  25 mg Oral Daily  . torsemide  20 mg Oral BID  . warfarin  5 mg Oral q1800  . Warfarin - Physician Dosing Inpatient   Does not apply q1800   Continuous Infusions: . sodium chloride Stopped (03/24/19 0928)   PRN Meds:.ALPRAZolam, Lidocaine (Anorectal), metoprolol tartrate, morphine injection, oxyCODONE, promethazine, sodium chloride flush, traMADol  Xrays Dg Chest 2 View  Result Date: 03/26/2019 CLINICAL DATA:  63 year old male with a history of atelectasis EXAM: CHEST - 2 VIEW COMPARISON:  03/23/2019, 03/22/2019 FINDINGS: Cardiomediastinal silhouette unchanged in size and contour. Surgical changes of left atrial clipping/amputation. Interval removal of right-sided thoracostomy tubes with no pneumothorax. Left IJ central line has been removed. Defibrillator pads have been removed. Epicardial pacing leads have been removed. Linear opacity in the right mid lung persists. No new confluent airspace disease, pneumothorax, pleural effusion, or edema. IMPRESSION:  Interval removal of thoracostomy tubes with no pneumothorax. Lung volumes are low with linear atelectasis/scarring, with potentially fluid in the minor fissure. Interval removal of left IJ catheter, epicardial pacing leads, defibrillator pads Electronically Signed   By: Corrie Mckusick D.O.   On: 03/26/2019 08:08    Assessment/Plan: S/P Procedure(s) (LRB): TEMPORARY PACEMAKER (N/A)  1 conts to do well 2 less PVC's and no further afib, hemodyn stable 3 INR 1.5 , will d/c on 5 mg coumadin 4 volume overload conts to improve, will d/c on q day 20 mg demadex 5 stable or discharge  LOS: 6 days    John Giovanni [facility]  03/27/2019 Pager [phone]  I have seen and examined the patient and agree with the assessment and plan as outlined.  D/C instructions given.  Rexene Alberts, MD 03/27/2019 9:14 AM

## 2019-03-27 NOTE — Telephone Encounter (Addendum)
   TELEPHONE CALL NOTE  This patient has been deemed a candidate for follow-up tele-health visit to limit community exposure during the Covid-19 pandemic. I spoke with the patient via phone to discuss instructions. This has been outlined on the patient's AVS. He is scheduled 6/4 with Fabian Sharp, PA.  He is familiar with the virtual visit platform having done one earlier this month and had already been consented.  He had previously done a phone visit but does have the ability to do a video visit through his wife's phone - so given that our office could potentially review his incision sites via video, he elected to do that for his post-hospital visit. He wants our office to call his wife's phone day of the visit so I put that number in appt notes per our protocol [phone]).  I also saw that someone has scheduled him for new Coumadin 5/28 at [facility] office. He usually sees [place] in [place] office, but is familiar with [facility] location. In the message I sent to our [facility] box requesting TOC call I also asked them to review/clarify instructions for patient arriving for his Coumadin visit that day in case the location was specifically chosen for drive-through capability.  Charlie Pitter, PA-C 03/27/2019 9:31 AM

## 2019-03-27 NOTE — Progress Notes (Addendum)
Patient was discharged today and was taken home by his wife. Patient left with all of his personal belongings. Peripheral  IV was removed, catheter intact.  Chest sutures removed, all sites clean/dry/intact.  AVS reviewed with patient and all questions were answered.

## 2019-03-29 ENCOUNTER — Encounter (HOSPITAL_COMMUNITY): Payer: Self-pay | Admitting: Cardiovascular Disease

## 2019-03-29 ENCOUNTER — Telehealth: Payer: Self-pay | Admitting: *Deleted

## 2019-03-29 ENCOUNTER — Telehealth: Payer: Self-pay

## 2019-03-29 NOTE — Telephone Encounter (Signed)
-----   Message from Charlie Pitter, [place] sent at 03/27/2019  9:27 AM EDT ----- Regarding: TOC call Hi TOC team,  Dr. Marlou Porch requested we schedule a 2 week f/u virtual visit with an APP which I have done for 6/4 with Angie Duke. Can you make sure he gets TOC call?  I also saw someone has already scheduled him for a new Coumadin clinic visit on 5/28. It's listed as the [facility] address. He is familiar with [employer] as that's where he's come for his echoes, but typically sees [place] in [place]. I told him I would pass onto the office to please call him with instructions on where to come, since he is new to Coumadin.  Thanks, Melina Copa PA-C

## 2019-03-29 NOTE — Telephone Encounter (Signed)
No answer will try later ./cy 

## 2019-03-29 NOTE — Telephone Encounter (Signed)
Patient contacted regarding discharge from [place] on 03/28/2019.  Patient understands to follow up with provider duke on 04/06/2019 at 10 am at virtual visit. Patient understands discharge instructions? yes Patient understands medications and regiment? yes Patient understands to bring all medications to this visit? yes   Postop Surgical Patients:                What is your wound status? Any signs/ symptoms of infection (Temp, redness/ red streaks, swelling, purulent drainage, foul odor or smell)?NO .             Please do not place any creams/ lotions/ or antibiotic ointment on any surgical incisions/ wounds without physician approval. OKAY .             Do you have any questions about your medications? NO All medications (except pain medications) are to be filled by your Cardiologist AFTER your first post op       appointment with them.  Are you taking your pain medication? YES .             How is your pain controlled? Pain level? MEDS- 7 THEN BELOW 5 AFTER MEDS .             If you require a refill on pain medications, know that the same medication/ amount may not be prescribed or a refill may not be given.  Please contact your pharmacy for refill requests.  .             Do you have help at home with ADL's? NO  If you have home health, have you been contacted or seen by the agency? .             Please refer to your Pre/post surgery booklet, there is a lot of useful information in it that may answer any questions you may have. .             Please note that it is ok to remove your surgical dressing, shower (soap/ water), and pat the incision dry.  For surgery related questions staff will route a phone note to CV DIV TCTS [facility] pool  [facility] [place], [place] [address]

## 2019-03-29 NOTE — Telephone Encounter (Signed)

## 2019-03-30 ENCOUNTER — Other Ambulatory Visit: Payer: Self-pay

## 2019-03-30 ENCOUNTER — Telehealth: Payer: Self-pay | Admitting: *Deleted

## 2019-03-30 ENCOUNTER — Ambulatory Visit (INDEPENDENT_AMBULATORY_CARE_PROVIDER_SITE_OTHER): Payer: BLUE CROSS/BLUE SHIELD | Admitting: Pharmacist

## 2019-03-30 DIAGNOSIS — I4892 Unspecified atrial flutter: Secondary | ICD-10-CM

## 2019-03-30 DIAGNOSIS — Z5181 Encounter for therapeutic drug level monitoring: Secondary | ICD-10-CM | POA: Diagnosis not present

## 2019-03-30 DIAGNOSIS — I4819 Other persistent atrial fibrillation: Secondary | ICD-10-CM | POA: Diagnosis not present

## 2019-03-30 LAB — POCT INR: INR: 2.1 (ref 2.0–3.0)

## 2019-03-30 NOTE — Telephone Encounter (Signed)
I have no idea what he is talking about

## 2019-03-30 NOTE — Telephone Encounter (Signed)
Patient left message on refill request line for a multivitamin. He stated that it was prescribed. He also, stated that the pharmacy doesn't know what multivitamin he is asking for. He pronounced a name that I'm not familiar with, it sounded like (?) sp Trinscon Votren with Vit C, Folic Acid, P37, and some other ingredients. He wants to know "where he can buy it".  He left his wife's telephone number to call to advise [phone]. Thank you.

## 2019-04-03 ENCOUNTER — Telehealth: Payer: Self-pay

## 2019-04-03 NOTE — Telephone Encounter (Signed)

## 2019-04-04 ENCOUNTER — Telehealth: Payer: Self-pay | Admitting: Physician Assistant

## 2019-04-04 NOTE — Telephone Encounter (Signed)
Call wife's phone-[phone]/ consent/ my chart/ pre reg completed

## 2019-04-05 NOTE — Progress Notes (Signed)
Virtual Visit via Video Note   This visit type was conducted due to national recommendations for restrictions regarding the COVID-19 Pandemic (e.g. social distancing) in an effort to limit this patient's exposure and mitigate transmission in our community.  Due to his co-morbid illnesses, this patient is at least at moderate risk for complications without adequate follow up.  This format is felt to be most appropriate for this patient at this time.  All issues noted in this document were discussed and addressed.  A limited physical exam was performed with this format.  Please refer to the patient's chart for his consent to telehealth for [facility].   Date:  04/06/2019   ID:  [name], DOB 1956-04-22, MRN [id number]  Patient Location: Home Provider Location: Other:  [facility]  PCP:  [name], MD  Cardiologist:  Quay Burow, MD  Electrophysiologist:  None   Evaluation Performed:  Follow-Up Visit  Chief Complaint:  Follow up for mitral valve repair and Maze  History of Present Illness:    [name] is a 63 y.o. male with history of severe mitral regurgitation, chronic persistent atrial fibrillation, chronic systolic heart failure, mitral valve prolapse, AV junctional bradycardia, hypertension.  He was seen in clinic with Dr. Gwenlyn Found on 12/02/2018 and found to be in rapid A. Fib.  He was started on Eliquis for stroke prevention and his metoprolol dose was increased.  Echocardiogram on 12/09/2018 showed reduced EF of 35 to 40%, moderate mitral regurgitation with possible flail segment of the posterior leaf.  On his return visit to [facility], he was volume overloaded and felt to be in an acute congestive heart failure exacerbation with Afib RVR.  He was sent directly to the hospital for admission.  He underwent TEE/DCCV.  TEE showed mitral valve prolapse with ruptured chordae tendon a and flail segment of the posterior leaf.  There was severe mitral regurgitation and  EF of 30 to 35%.  He is successfully cardioverted to sinus rhythm.  TCTS was consulted.  He underwent diagnostic heart cath 08/2019 that revealed widely patent normal coronary arteries and normal LVEDP.  He was hospitalized 03/21/2019 through 03/28/2019.  He underwent minimally invasive mitral valve repair and maze procedure.  On postop day 1, he was noted to have complete heart block and temporary pacing wire was placed by Dr. [name].  Post op A. fib converted with amiodarone and beta-blocker therapy.  He was started on Coumadin for his mitral valve repair.    He presents today for follow-up.  He is doing well and walking up to 15 min per day. He was instructed to not walk in the heat yet.  His blood pressure and heart rate are "perfect every day." His incisions are healing well. He has no problems/no complaints. He has an INR check this afternoon.   The patient does not have symptoms concerning for COVID-19 infection (fever, chills, cough, or new shortness of breath).    Past Medical History:  Diagnosis Date   Acute on chronic systolic (congestive) heart failure (HCC) 12/30/2018   Allergy    Arthritis    Atrial flutter with rapid ventricular response (HCC)    Chronic systolic (congestive) heart failure (HCC)    Dilated aortic root ([place]) 01/04/2019   Dilated cardiomyopathy ([place]) 01/04/2019   GERD (gastroesophageal reflux disease)    history of   Heart murmur    History of colon polyps    Hypertension    Insomnia  Mitral valve prolapse    Mitral valve regurgitation    Persistent atrial fibrillation 12/02/2018   S/P Maze operation for atrial fibrillation 03/21/2019   Complete bilateral atrial lesion set using cryothermy and bipolar radiofrequency ablation with clipping of LA appendage via right mini thoracotomy approach   S/P minimally invasive mitral valve repair 03/21/2019   Complex valvuloplasty including triangular resection of posterior leaflet, artificial Gore-tex neochord  placement x4 and 36 mm Sorin Memo 4D ring annuloplasty via right mini thoracotomy approach   Seizures ([place])    had one approx. 30 yrs ago,has not had any since   Severe mitral regurgitation    Past Surgical History:  Procedure Laterality Date   ANKLE FUSION  left   Dec. 2012   ARTHROSCOPY KNEE W/ DRILLING  bilateral   2012   CARDIOVERSION N/A 01/03/2019   Procedure: CARDIOVERSION;  Surgeon: Sueanne Margarita, MD;  Location: [place] [facility]COPY;  Service: Cardiovascular;  Laterality: N/A;   CLIPPING OF ATRIAL APPENDAGE  03/21/2019   Procedure: Clipping Of Atrial Appendage using 38mm Atricure Pro2 Clip;  Surgeon: Rexene Alberts, MD;  Location: [place] OR;  Service: Open Heart Surgery;;   COLONOSCOPY     x2   FOOT ARTHRODESIS  06/23/2012   Procedure: ARTHRODESIS FOOT;  Surgeon: Wylene Simmer, MD;  Location: [place];  Service: Orthopedics;  Laterality: Left;  Left Subtalar and Talonavicular Joint Revision Arthrodesis  Aspiration of Bone Marrow from Left Hip    HAIR TRANSPLANT     HARDWARE REMOVAL  06/23/2012   Procedure: HARDWARE REMOVAL;  Surgeon: Wylene Simmer, MD;  Location: [place];  Service: Orthopedics;  Laterality: Left;  Removal of Deep Implant  X's 3   INSERTION OF MESH N/A 07/10/2016   Procedure: INSERTION OF MESH;  Surgeon: Coralie Keens, MD;  Location: [place];  Service: General;  Laterality: N/A;   JOINT REPLACEMENT     right hip  01-2011   LEFT HEART CATH AND CORONARY ANGIOGRAPHY N/A 03/16/2019   Procedure: LEFT HEART CATH AND CORONARY ANGIOGRAPHY;  Surgeon: Sherren Mocha, MD;  Location: [place] CV LAB;  Service: Cardiovascular;  Laterality: N/A;   LIMB SPARING RESECTION HIP W/ SADDLE JOINT REPLACEMENT Right    MINIMALLY INVASIVE MAZE PROCEDURE N/A 03/21/2019   Procedure: MINIMALLY INVASIVE MAZE PROCEDURE;  Surgeon: Rexene Alberts, MD;  Location: [place];  Service: Open Heart Surgery;  Laterality: N/A;   MITRAL VALVE REPAIR Right 03/21/2019   Procedure:  MINIMALLY INVASIVE MITRAL VALVE REPAIR (MVR) using Memo 4D ring size 36;  Surgeon: Rexene Alberts, MD;  Location: [place];  Service: Open Heart Surgery;  Laterality: Right;   TEE WITHOUT CARDIOVERSION N/A 01/03/2019   Procedure: TRANSESOPHAGEAL ECHOCARDIOGRAM (TEE);  Surgeon: Sueanne Margarita, MD;  Location: [facility] END[facility]COPY;  Service: Cardiovascular;  Laterality: N/A;   TEE WITHOUT CARDIOVERSION N/A 03/21/2019   Procedure: TRANSESOPHAGEAL ECHOCARDIOGRAM (TEE);  Surgeon: Rexene Alberts, MD;  Location: [place];  Service: Open Heart Surgery;  Laterality: N/A;   TEMPORARY PACEMAKER N/A 03/22/2019   Procedure: TEMPORARY PACEMAKER;  Surgeon: Belva Crome, MD;  Location: [place] CV LAB;  Service: Cardiovascular;  Laterality: N/A;   TOTAL KNEE ARTHROPLASTY Right 01/19/2014   Procedure: RIGHT TOTAL KNEE ARTHROPLASTY, Steroid injection left knee;  Surgeon: Mcarthur Rossetti, MD;  Location: [facility];  Service: Orthopedics;  Laterality: Right;   TOTAL KNEE ARTHROPLASTY Left 10/23/2014   Procedure: LEFT TOTAL KNEE ARTHROPLASTY;  Surgeon: Mcarthur Rossetti, MD;  Location: [facility];  Service: Orthopedics;  Laterality: Left;   UMBILICAL HERNIA REPAIR N/A 07/10/2016   Procedure: UMBILICAL HERNIA REPAIR;  Surgeon: Coralie Keens, MD;  Location: [place];  Service: General;  Laterality: N/A;     Current Meds  Medication Sig   amiodarone (PACERONE) 200 MG tablet Take 1 tablet (200 mg total) by mouth daily.   aspirin EC 81 MG EC tablet Take 1 tablet (81 mg total) by mouth daily.   diltiazem 2 % GEL Apply 1 application topically 3 (three) times daily. With 5 % lidocaine (Patient taking differently: Apply 1 application topically 3 (three) times daily as needed (rectal discomfort). With 5 % lidocaine)   docusate sodium (COLACE) 100 MG capsule Take 300 mg by mouth daily.   ferrous [id number] C-folic acid (TRINSICON / FOLTRIN) capsule Take 1 capsule by mouth daily with breakfast.     KLOR-CON M10 10 MEQ tablet Take 10 mEq by mouth 2 (two) times daily.   metoprolol tartrate (LOPRESSOR) 25 MG tablet Take 0.5 tablets (12.5 mg total) by mouth 2 (two) times daily.   Multiple Vitamin (MULTIVITAMIN WITH MINERALS) TABS tablet Take 1 tablet by mouth daily.   sacubitril-valsartan (ENTRESTO) 24-26 MG Take 1 tablet by mouth 2 (two) times daily.   spironolactone (ALDACTONE) 25 MG tablet Take 1 tablet (25 mg total) by mouth daily.   torsemide (DEMADEX) 20 MG tablet Take 1 tablet (20 mg total) by mouth daily.   warfarin (COUMADIN) 5 MG tablet Take 1 tablet (5 mg total) by mouth daily. As directed by coumadin clinic     Allergies:   Patient has no known allergies.   Social History   Tobacco Use   Smoking status: Never Smoker   Smokeless tobacco: Never Used  Substance Use Topics   Alcohol use: Yes    Comment: 1 or 2 drink daily   Drug use: No     Family Hx: The patient's family history includes Colon cancer in his father; Diabetes in his mother; Hypertension in his father. There is no history of Stomach cancer, Esophageal cancer, Pancreatic cancer, or Rectal cancer.  ROS:   Please see the history of present illness.     All other systems reviewed and are negative.   Prior CV studies:   The following studies were reviewed today:  TTE 01/03/19: . The left ventricle has moderately reduced systolic function, with an ejection fraction of 35-40%.  2. The right ventricle has moderately reduced systolic function.  3. Left atrial size was severely dilated.  4. Normal LA appendage with no evidence of thrombus.  5. Right atrial size was severely dilated.  6. There is severe myoxmatous degeneration of the posterior mitral valve leafelt with severe prolapse of the P2 component of the posterior leaflet. There appears to be a ruptured chordae tendinae with partial flail leaflet. There is moderate to severe  eccentric mitral regurgitation directed towards the interatrial  septum and wraps around the LA to the posterior wall and mouth of the pulmonary vein but no flow reversal in the pulmonary vein by doppler.  7. The tricuspid valve was normal in structure with trivial TR.  8. The aortic valve is tricuspid.  9. The pulmonic valve was normal in structure with trivial PR. 10. The aortic root and ascending aorta are normal in size and structure. 11. Normal interatrial septum with no evidence of shunt by colorflow doppler.   Left heart cath 03/16/19: 1.  Widely patent, angiographically normal coronary arteries 2.  Normal  LVEDP  Recommend: Continued preoperative evaluation for mitral valve repair next week   Labs/Other Tests and Data Reviewed:    EKG:  An ECG dated 03/25/19 was personally reviewed today and demonstrated:  unclear rhythm, wide complex QRS with RBBB, if sinus then prolonged PR interval  Recent Labs: 12/30/2018: B Natriuretic Peptide 1,365.8 03/15/2019: TSH 4.420 03/16/2019: ALT 32 03/24/2019: Hemoglobin 10.2; Platelets 72 03/25/2019: Magnesium 2.2 03/26/2019: BUN 18; Creatinine, Ser 1.00; Potassium 3.9; Sodium 137   Recent Lipid Panel No results found for: CHOL, TRIG, HDL, CHOLHDL, LDLCALC, LDLDIRECT  Wt Readings from Last 3 Encounters:  04/06/19 236 lb (107 kg)  03/27/19 242 lb 3.2 oz (109.9 kg)  03/20/19 241 lb (109.3 kg)     Objective:    Vital Signs:  BP 117/72    Pulse 88    Temp 98.1 F (36.7 C)    Ht 6\' 3"  (1.905 m)    Wt 236 lb (107 kg)    BMI 29.50 kg/m    VITAL SIGNS:  reviewed GEN:  no acute distress EYES:  sclerae anicteric, EOMI - Extraocular Movements Intact RESPIRATORY:  normal respiratory effort, symmetric expansion CARDIOVASCULAR:  no peripheral edema SKIN:  no rash, lesions or ulcers. MUSCULOSKELETAL:  no obvious deformities. NEURO:  alert and oriented x 3, no obvious focal deficit PSYCH:  normal affect  Right wall chest incision and two drain sites C/D/I  ASSESSMENT & PLAN:    1. Severe mitral stenosis 2.  Ruptured chordae tendon 3. S/P minimally invasive mitral valve repair - on coumadin, followed in our clinic - he has an INR check this afternoon - he is euvolemic and without complaints, doing very well since surgery - incisions and drain sites are C/D/I to the extent that I could see them over video chat   4. Persistent atrial fibrillation 5. S/P Maze procedure - anticoagulated with coumadin - his heart rate has been in the 80-90s - he was on amiodarone prior to his hospitalization - LFTs were normal prior to discharge - continue present dose of amiodarone - discussed that this could potentially be weaned off if he was able to maintain sinus rhythm - he has a follow up appt with TCTS next week   6. Chronic systolic heart failure - EF by TEE on 03/21/19 was 35-40% - he is doing well on toprol, entresto, and torsemide with potassium supplementation - he was on these medications in March with stable labs in May. - would benefit from titrating entresto, if possible, at his next appt - repeat echo in 3-6 months to evaluate improvement in EF   COVID-19 Education: The signs and symptoms of COVID-19 were discussed with the patient and how to seek care for testing (follow up with PCP or arrange E-visit).  The importance of social distancing was discussed today.  Time:   Today, I have spent 16 minutes with the patient with telehealth technology discussing the above problems.     Medication Adjustments/Labs and Tests Ordered: Current medicines are reviewed at length with the patient today.  Concerns regarding medicines are outlined above.   Tests Ordered: No orders of the defined types were placed in this encounter.   Medication Changes: No orders of the defined types were placed in this encounter.   Disposition:  Follow up in 3 month(s) with Dr. Gwenlyn Found, doctor only  Signed, [place], [place]  04/06/2019 11:05 AM    [place]

## 2019-04-06 ENCOUNTER — Telehealth (INDEPENDENT_AMBULATORY_CARE_PROVIDER_SITE_OTHER): Payer: BC Managed Care – PPO | Admitting: Physician Assistant

## 2019-04-06 ENCOUNTER — Other Ambulatory Visit: Payer: Self-pay

## 2019-04-06 ENCOUNTER — Ambulatory Visit (INDEPENDENT_AMBULATORY_CARE_PROVIDER_SITE_OTHER): Payer: BC Managed Care – PPO | Admitting: *Deleted

## 2019-04-06 ENCOUNTER — Encounter: Payer: Self-pay | Admitting: Physician Assistant

## 2019-04-06 VITALS — BP 117/72 | HR 88 | Temp 98.1°F | Ht 75.0 in | Wt 236.0 lb

## 2019-04-06 DIAGNOSIS — I4819 Other persistent atrial fibrillation: Secondary | ICD-10-CM

## 2019-04-06 DIAGNOSIS — I340 Nonrheumatic mitral (valve) insufficiency: Secondary | ICD-10-CM

## 2019-04-06 DIAGNOSIS — Z5181 Encounter for therapeutic drug level monitoring: Secondary | ICD-10-CM

## 2019-04-06 DIAGNOSIS — I4892 Unspecified atrial flutter: Secondary | ICD-10-CM | POA: Diagnosis not present

## 2019-04-06 DIAGNOSIS — Z9889 Other specified postprocedural states: Secondary | ICD-10-CM

## 2019-04-06 DIAGNOSIS — I5022 Chronic systolic (congestive) heart failure: Secondary | ICD-10-CM

## 2019-04-06 DIAGNOSIS — I341 Nonrheumatic mitral (valve) prolapse: Secondary | ICD-10-CM

## 2019-04-06 DIAGNOSIS — Z8679 Personal history of other diseases of the circulatory system: Secondary | ICD-10-CM

## 2019-04-06 LAB — POCT INR: INR: 2.3 (ref 2.0–3.0)

## 2019-04-06 NOTE — Patient Instructions (Signed)
Description   Continue taking 1 (5 mg) tablet daily. Follow up INR check in 1 week.

## 2019-04-06 NOTE — Patient Instructions (Signed)
Medication Instructions:  Your physician recommends that you continue on your current medications as directed. Please refer to the Current Medication list given to you today.  If you need a refill on your cardiac medications before your next appointment, please call your pharmacy.   Follow-Up: At [facility], you and your health needs are our priority.  As part of our continuing mission to provide you with exceptional heart care, we have created designated Provider Care Teams.  These Care Teams include your primary Cardiologist (physician) and Advanced Practice Providers (APPs -  Physician Assistants and Nurse Practitioners) who all work together to provide you with the care you need, when you need it. . You have been scheduled for a follow-up visit with Dr. Gwenlyn Found on Wednesday, 07/12/19 at 3:45 PM.  Any Other Special Instructions Will Be Listed Below (If Applicable). None

## 2019-04-07 ENCOUNTER — Other Ambulatory Visit: Payer: Self-pay | Admitting: Thoracic Surgery (Cardiothoracic Vascular Surgery)

## 2019-04-07 DIAGNOSIS — Z9889 Other specified postprocedural states: Secondary | ICD-10-CM

## 2019-04-10 ENCOUNTER — Ambulatory Visit
Admission: RE | Admit: 2019-04-10 | Discharge: 2019-04-10 | Disposition: A | Discharge status: Home / Self Care | Payer: BC Managed Care – PPO | Source: Ambulatory Visit | Attending: Thoracic Surgery (Cardiothoracic Vascular Surgery) | Admitting: Thoracic Surgery (Cardiothoracic Vascular Surgery)

## 2019-04-10 ENCOUNTER — Ambulatory Visit (INDEPENDENT_AMBULATORY_CARE_PROVIDER_SITE_OTHER): Payer: Self-pay | Admitting: Physician Assistant

## 2019-04-10 ENCOUNTER — Other Ambulatory Visit: Payer: Self-pay

## 2019-04-10 VITALS — BP 101/68 | HR 84 | Temp 97.5°F | Resp 20 | Ht 75.0 in | Wt 241.0 lb

## 2019-04-10 DIAGNOSIS — Z952 Presence of prosthetic heart valve: Secondary | ICD-10-CM | POA: Diagnosis not present

## 2019-04-10 DIAGNOSIS — Z9889 Other specified postprocedural states: Secondary | ICD-10-CM

## 2019-04-10 NOTE — Progress Notes (Signed)
HPI:  Patient returns for routine postoperative follow-up having undergone mitral valve repair, Maze procedure, and left atrial appendage clipping by Dr. Ihor Gully on 03/21/2019 for severe mitral valve regurgitation from mitral valve prolapse and persistent atrial fibrillation.   The patient's early postoperative recovery while in the hospital was notable for Atrial fibrillation that was treated with amiodarone resulting in conversion back to sinus rhythm.  He otherwise made an uneventful and progressive recovery.Atrial fibrillation that was treated with amiodarone resulting in conversion back to sinus rhythm.  He was discharged home on 03/28/2019.  Since his discharge, he is continued to make a progressive recovery.  He denies shortness of breath.  He is not requiring any narcotic analgesics anymore.  He did notice some easy fatigability while playing a short round of golf yesterday.  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 He has not noticed any palpitations.  He denies fever, cough, or shortness of breath.  His medications have not changed since his discharge home except he no longer is taking any pain medication other than Tylenol.   Current Outpatient Medications  Medication Sig Dispense Refill  . amiodarone (PACERONE) 200 MG tablet Take 1 tablet (200 mg total) by mouth daily. 30 tablet 1  . aspirin EC 81 MG EC tablet Take 1 tablet (81 mg total) by mouth daily.    Marland Kitchen diltiazem 2 % GEL Apply 1 application topically 3 (three) times daily. With 5 % lidocaine (Patient taking differently: Apply 1 application topically 3 (three) times daily as needed (rectal discomfort). With 5 % lidocaine) 30 g 1  . docusate sodium (COLACE) 100 MG capsule Take 300 mg by mouth daily.    . ferrous [id number] C-folic acid (TRINSICON / FOLTRIN) capsule Take 1 capsule by mouth daily with breakfast. 30 capsule 1  . KLOR-CON M10 10 MEQ tablet Take 10  mEq by mouth 2 (two) times daily.    . metoprolol tartrate (LOPRESSOR) 25 MG tablet Take 0.5 tablets (12.5 mg total) by mouth 2 (two) times daily. 30 tablet 1  . Multiple Vitamin (MULTIVITAMIN WITH MINERALS) TABS tablet Take 1 tablet by mouth daily.    . sacubitril-valsartan (ENTRESTO) 24-26 MG Take 1 tablet by mouth 2 (two) times daily. 60 tablet 6  . spironolactone (ALDACTONE) 25 MG tablet Take 1 tablet (25 mg total) by mouth daily. 30 tablet 6  . torsemide (DEMADEX) 20 MG tablet Take 1 tablet (20 mg total) by mouth daily. 30 tablet 6  . warfarin (COUMADIN) 5 MG tablet Take 1 tablet (5 mg total) by mouth daily. As directed by coumadin clinic 100 tablet 1   No current facility-administered medications for this visit.     Physical Exam  Vital signs Blood pressure:  101/68 Pulse:    84 Respirations:   20 Temperature   97.5 Oxygen saturation  98%  Heart: Regular rate and rhythm Chest: Breath sounds are clear to auscultation anterior and posterior.  Chest x-ray reviewed.  Minor streaky atelectasis in the right base.  No significant effusion. Extremities: No peripheral edema: He does have some venous stasis changes in the left lower leg and ankle which is chronic.   Diagnostic Tests:  EXAM: CHEST - 2 VIEW  COMPARISON:  03/26/2019  FINDINGS: The cardiac silhouette, mediastinal and hilar contours are within normal limits and stable. Stable tortuosity of the thoracic aorta. Some persistent streaky right lower lobe subsegmental atelectasis but no pleural effusion, pulmonary  edema or pulmonary infiltrates. Stable surgical changes from mitral valve repair and left atrial appendage closure device.  The bony structures are intact.  IMPRESSION: Some residual streaky right lower lobe atelectasis but no edema or effusions.   Electronically Signed   By: Marijo Sanes M.D.   On: 04/10/2019 13:50   Impression: Mr. Enke continues to make a progressive and uneventful recovery  following minute mitral valve repair with maze procedure and left atrial appendage clipping.  He appears to be in a sinus rhythm on exam today.  He has not noted any palpitations.  He is pleased that he is much less short of breath than prior to surgery.  His INR last week was 2.3.  He is taking Coumadin 5 mg p.o. daily.  Plan: He may resume driving.  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  He is advised to avoid strenuous activity including lifting more than 15 pounds for another 4 weeks.  No change in medications appear necessary.  I asked him to return in 1 month to see Dr. Roxy Manns.   Antony Odea, PA-C [facility] [phone]

## 2019-04-10 NOTE — Patient Instructions (Signed)
-  No medication changes -Okay to resume driving -Avoid lifting or strenuous activity for another 4 weeks -Continue incentive spirometry as instructed for the next couple of weeks. -Office follow-up in 1 month with a chest x-ray

## 2019-04-11 NOTE — Telephone Encounter (Signed)
Spoke with pt. Vitamin pt is referring to is Trinsicon/Foltrin. He states he has already picked it up. Advised pt to reach out to Jadene Pierini, PA-C (pt stated he knows the PA) for future refills of med. Pt verbalized understanding

## 2019-04-12 ENCOUNTER — Telehealth: Payer: Self-pay

## 2019-04-12 ENCOUNTER — Encounter: Payer: Self-pay | Admitting: Family Medicine

## 2019-04-12 NOTE — Telephone Encounter (Signed)

## 2019-04-17 ENCOUNTER — Telehealth (HOSPITAL_COMMUNITY): Payer: Self-pay | Admitting: *Deleted

## 2019-04-18 ENCOUNTER — Other Ambulatory Visit: Payer: Self-pay

## 2019-04-18 ENCOUNTER — Ambulatory Visit (INDEPENDENT_AMBULATORY_CARE_PROVIDER_SITE_OTHER): Payer: BC Managed Care – PPO | Admitting: *Deleted

## 2019-04-18 DIAGNOSIS — I4819 Other persistent atrial fibrillation: Secondary | ICD-10-CM | POA: Diagnosis not present

## 2019-04-18 DIAGNOSIS — I4892 Unspecified atrial flutter: Secondary | ICD-10-CM | POA: Diagnosis not present

## 2019-04-18 DIAGNOSIS — Z5181 Encounter for therapeutic drug level monitoring: Secondary | ICD-10-CM

## 2019-04-18 LAB — POCT INR: INR: 2.9 (ref 2.0–3.0)

## 2019-04-18 NOTE — Patient Instructions (Addendum)
Description   Today take 1/2 tablet (2.5mg ) then continue taking 1 tablet (5mg ) daily. Recheck INR in 2 weeks.  Call clinic [phone]

## 2019-04-24 ENCOUNTER — Telehealth: Payer: Self-pay | Admitting: Gastroenterology

## 2019-04-24 MED ORDER — DILTIAZEM GEL 2 %: 1.0000 "application " | Freq: Three times a day (TID) | CUTANEOUS | 1 refills | Status: DC

## 2019-04-24 NOTE — Telephone Encounter (Signed)
Pt stated that diltiazem gel has been working for him and requested a refill.

## 2019-04-24 NOTE — Telephone Encounter (Signed)
Prescription faxed to [facility]. Left a message informing patient.

## 2019-04-27 ENCOUNTER — Telehealth: Payer: Self-pay

## 2019-04-27 NOTE — Telephone Encounter (Signed)

## 2019-05-04 ENCOUNTER — Other Ambulatory Visit: Payer: Self-pay

## 2019-05-04 ENCOUNTER — Ambulatory Visit (INDEPENDENT_AMBULATORY_CARE_PROVIDER_SITE_OTHER): Payer: BC Managed Care – PPO

## 2019-05-04 DIAGNOSIS — I4892 Unspecified atrial flutter: Secondary | ICD-10-CM | POA: Diagnosis not present

## 2019-05-04 DIAGNOSIS — I4819 Other persistent atrial fibrillation: Secondary | ICD-10-CM | POA: Diagnosis not present

## 2019-05-04 DIAGNOSIS — Z5181 Encounter for therapeutic drug level monitoring: Secondary | ICD-10-CM

## 2019-05-04 LAB — POCT INR: INR: 2.3 (ref 2.0–3.0)

## 2019-05-04 NOTE — Patient Instructions (Signed)
Description   Continue on same dosage 1 tablet (5mg ) daily. Recheck INR in 3 weeks.  Call clinic [phone]

## 2019-05-11 ENCOUNTER — Other Ambulatory Visit: Payer: Self-pay | Admitting: Thoracic Surgery (Cardiothoracic Vascular Surgery)

## 2019-05-11 DIAGNOSIS — I77810 Thoracic aortic ectasia: Secondary | ICD-10-CM

## 2019-05-22 ENCOUNTER — Ambulatory Visit
Admission: RE | Admit: 2019-05-22 | Discharge: 2019-05-22 | Disposition: A | Discharge status: Home / Self Care | Payer: BC Managed Care – PPO | Source: Ambulatory Visit | Attending: Thoracic Surgery (Cardiothoracic Vascular Surgery) | Admitting: Thoracic Surgery (Cardiothoracic Vascular Surgery)

## 2019-05-22 ENCOUNTER — Ambulatory Visit (INDEPENDENT_AMBULATORY_CARE_PROVIDER_SITE_OTHER): Payer: Self-pay | Admitting: Thoracic Surgery (Cardiothoracic Vascular Surgery)

## 2019-05-22 ENCOUNTER — Encounter: Payer: Self-pay | Admitting: Thoracic Surgery (Cardiothoracic Vascular Surgery)

## 2019-05-22 ENCOUNTER — Other Ambulatory Visit: Payer: Self-pay | Admitting: *Deleted

## 2019-05-22 ENCOUNTER — Other Ambulatory Visit: Payer: Self-pay

## 2019-05-22 VITALS — BP 137/82 | HR 49 | Temp 97.0°F | Resp 18 | Ht 75.0 in | Wt 248.0 lb

## 2019-05-22 DIAGNOSIS — Z8679 Personal history of other diseases of the circulatory system: Secondary | ICD-10-CM

## 2019-05-22 DIAGNOSIS — J986 Disorders of diaphragm: Secondary | ICD-10-CM | POA: Diagnosis not present

## 2019-05-22 DIAGNOSIS — Z954 Presence of other heart-valve replacement: Secondary | ICD-10-CM | POA: Diagnosis not present

## 2019-05-22 DIAGNOSIS — Z9889 Other specified postprocedural states: Secondary | ICD-10-CM

## 2019-05-22 DIAGNOSIS — I77810 Thoracic aortic ectasia: Secondary | ICD-10-CM

## 2019-05-22 NOTE — Progress Notes (Signed)
ECHO

## 2019-05-22 NOTE — Patient Instructions (Signed)

## 2019-05-22 NOTE — Progress Notes (Signed)
[address]       [place] [address]             [phone]     CARDIOTHORACIC SURGERY OFFICE NOTE  Referring Provider is Lorretta Harp, MD PCP is [name], MD   HPI:  Patient is a 63 year old male with mitral valve prolapse and severe mitral regurgitation, persistent atrial fibrillation and atrial flutter, chronic combined systolic and diastolic congestive heart failure, hypertension, and degenerative arthritis who returns to the office today for routine follow-up status post minimally invasive mitral valve repair and Maze procedure on Mar 21, 2019.  During the patient's early postoperative recovery he developed recurrent atrial fibrillation for which he was treated with amiodarone.  He had a brief episode of bradycardia after amiodarone was started but ultimately was discharged from the hospital in what appeared to be sinus rhythm with first-degree AV block versus ectopic atrial flutter with controlled ventricular rate on the seventh postoperative day.  He was last seen here in our office on April 10, 2019 at which time he was doing well.  He returns to the office today and reports that he continues to feel well.  His only complaint is that his strength and stamina has been slow to recover.  He no longer has any pain in his chest.  He has no shortness of breath.  He has never had any palpitations.  He went to play golf the week before last and felt weak and was worried because his golf swing seemed uncoordinated.  He otherwise feels quite well.  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  Overall he is pleased with his progress.  He remains anticoagulated using warfarin.  Most recent INR was reported 2.3 on May 04, 2019.   Current Outpatient Medications  Medication Sig Dispense Refill  . amiodarone (PACERONE) 200 MG tablet Take 1 tablet (200 mg total) by mouth daily. 30 tablet 1  . aspirin EC 81 MG EC tablet Take 1 tablet (81 mg total) by mouth daily.     Marland Kitchen diltiazem 2 % GEL Apply 1 application topically 3 (three) times daily. With 5 % lidocaine 30 g 1  . docusate sodium (COLACE) 100 MG capsule Take 300 mg by mouth daily.    . ferrous [id number] C-folic acid (TRINSICON / FOLTRIN) capsule Take 1 capsule by mouth daily with breakfast. 30 capsule 1  . KLOR-CON M10 10 MEQ tablet Take 10 mEq by mouth 2 (two) times daily.    . metoprolol tartrate (LOPRESSOR) 25 MG tablet Take 0.5 tablets (12.5 mg total) by mouth 2 (two) times daily. 30 tablet 1  . Multiple Vitamin (MULTIVITAMIN WITH MINERALS) TABS tablet Take 1 tablet by mouth daily.    . sacubitril-valsartan (ENTRESTO) 24-26 MG Take 1 tablet by mouth 2 (two) times daily. 60 tablet 6  . spironolactone (ALDACTONE) 25 MG tablet Take 1 tablet (25 mg total) by mouth daily. 30 tablet 6  . torsemide (DEMADEX) 20 MG tablet Take 1 tablet (20 mg total) by mouth daily. 30 tablet 6  . warfarin (COUMADIN) 5 MG tablet Take 1 tablet (5 mg total) by mouth daily. As directed by coumadin clinic 100 tablet 1   No current facility-administered medications for this visit.       Physical Exam:   BP 137/82 (BP Location: Left Arm, Patient Position: Sitting, Cuff Size: Normal)   Pulse (!) 49   Temp (!) 97 F (36.1 C)  Resp 18   Ht 6\' 3"  (1.905 m)   Wt 248 lb (112.5 kg)   SpO2 96% Comment: RA  BMI 31.00 kg/m   General:  Well-appearing  Chest:   Clear to auscultation with symmetrical breath sounds  CV:   Irregular rhythm, no murmur  Incisions:  Well-healed  Abdomen:  Soft nontender  Extremities:  Warm and well-perfused, no edema  Diagnostic Tests:  CHEST - 2 VIEW  COMPARISON:  04/10/2019  FINDINGS: Interval improvement in atelectasis of the right midlung with unchanged anterior eventration of the right hemidiaphragm. No new or focal airspace opacity. Unchanged mild cardiomegaly and tortuosity of the thoracic aorta. Surgical mitral valve annulus and left atrial appendage clip. Disc  degenerative disease of the thoracic spine.  IMPRESSION: Interval improvement in atelectasis of the right midlung with unchanged anterior eventration of the right hemidiaphragm. No new or focal airspace opacity. Unchanged mild cardiomegaly and tortuosity of the thoracic aorta. Surgical mitral valve annulus and left atrial appendage clip. Disc degenerative disease of the thoracic spine.   Electronically Signed   By: Eddie Candle M.D.   On: 05/22/2019 13:08      2 channel telemetry rhythm strip demonstrates what appears to be atrial fibrillation.  Heart rate is irregular but well controlled.  The rhythm is wide-complex.   Impression:  Patient is clinically doing well approximately 2 months status post minimally invasive mitral valve repair and Maze procedure.  He does appear to be in atrial fibrillation with controlled ventricular rate.  The patient remains anticoagulated using warfarin with therapeutic INR.  He remains on oral amiodarone.  Plan:  I think it would be wise to check a formal 12-lead EKG to further assess the patient's rhythm and consider an attempt at DC cardioversion if the patient appears to be in atrial fibrillation.  We will contact Dr. Kennon Holter office to initiate the process as soon as practical.  At some point the patient will need to undergo routine follow-up echocardiogram to assess left ventricular function and mitral valve repair.  I have encouraged the patient to continue to increase his physical activity without any particular limitations.  We have not recommended any changes to his current medications.  We will check in with the patient via telemedicine visit and review the results of his follow-up echocardiogram in 4 to 6 weeks.  All questions answered.    Valentina Gu. Roxy Manns, MD 05/22/2019 1:45 PM

## 2019-05-23 ENCOUNTER — Telehealth: Payer: Self-pay

## 2019-05-23 NOTE — Telephone Encounter (Signed)
lmom for prescreen  

## 2019-05-24 ENCOUNTER — Telehealth: Payer: Self-pay

## 2019-05-24 NOTE — Telephone Encounter (Signed)
    COVID-19 Pre-Screening Questions:  . In the past 7 to 10 days have you had a cough,  shortness of breath, headache, congestion, fever (100 or greater) body aches, chills, sore throat, or sudden loss of taste or sense of smell? NO . Have you been around anyone with known Covid 19? NO . Have you been around anyone who is awaiting Covid 19 test results in the past 7 to 10 days? NO . Have you been around anyone who has been exposed to Covid 19, or has mentioned symptoms of Covid 19 within the past 7 to 10 days? NO  If you have any concerns/questions about symptoms patients report during screening (either on the phone or at threshold). Contact the provider seeing the patient or DOD for further guidance.  If neither are available contact a member of the leadership team.      Spoke with pt regarding setting him up for an appt with Dr. Gwenlyn Found today in the afternoon to discuss cardioversion. Pt stated he is working and won't be able to make the appt. Attempted to schedule pt for an appt with APP tomorrow. Pt states b/c of work and other appts he has this week, he will not be able to present to the office until Monday 7/27. Pt set for appt 7/27 with Kerin Ransom at 1100 and verbalized understanding. Pt aware of mask policy and guest policy and verbalized understanding

## 2019-05-25 ENCOUNTER — Other Ambulatory Visit: Payer: Self-pay

## 2019-05-25 ENCOUNTER — Ambulatory Visit (INDEPENDENT_AMBULATORY_CARE_PROVIDER_SITE_OTHER): Payer: BC Managed Care – PPO | Admitting: Pharmacist

## 2019-05-25 ENCOUNTER — Other Ambulatory Visit: Payer: Self-pay | Admitting: Surgical

## 2019-05-25 ENCOUNTER — Ambulatory Visit: Payer: BC Managed Care – PPO | Admitting: Physician Assistant

## 2019-05-25 DIAGNOSIS — I4819 Other persistent atrial fibrillation: Secondary | ICD-10-CM

## 2019-05-25 DIAGNOSIS — Z5181 Encounter for therapeutic drug level monitoring: Secondary | ICD-10-CM | POA: Diagnosis not present

## 2019-05-25 DIAGNOSIS — I4892 Unspecified atrial flutter: Secondary | ICD-10-CM | POA: Diagnosis not present

## 2019-05-25 LAB — POCT INR: INR: 2 (ref 2.0–3.0)

## 2019-05-25 MED ORDER — METOPROLOL TARTRATE 25 MG PO TABS: 12.5000 mg | ORAL_TABLET | Freq: Two times a day (BID) | ORAL | 1 refills | Status: DC

## 2019-05-25 NOTE — Telephone Encounter (Signed)
Refill

## 2019-05-26 ENCOUNTER — Ambulatory Visit (HOSPITAL_COMMUNITY): Payer: BC Managed Care – PPO | Attending: Cardiology

## 2019-05-26 DIAGNOSIS — Z9889 Other specified postprocedural states: Secondary | ICD-10-CM | POA: Diagnosis not present

## 2019-05-29 ENCOUNTER — Encounter: Payer: Self-pay | Admitting: Cardiology

## 2019-05-29 ENCOUNTER — Ambulatory Visit: Payer: BC Managed Care – PPO | Admitting: Cardiology

## 2019-05-29 ENCOUNTER — Other Ambulatory Visit: Payer: Self-pay

## 2019-05-29 VITALS — BP 126/81 | HR 83 | Temp 96.6°F | Ht 75.0 in | Wt 249.0 lb

## 2019-05-29 DIAGNOSIS — I10 Essential (primary) hypertension: Secondary | ICD-10-CM | POA: Diagnosis not present

## 2019-05-29 DIAGNOSIS — Z9889 Other specified postprocedural states: Secondary | ICD-10-CM

## 2019-05-29 DIAGNOSIS — Z8679 Personal history of other diseases of the circulatory system: Secondary | ICD-10-CM

## 2019-05-29 DIAGNOSIS — I4819 Other persistent atrial fibrillation: Secondary | ICD-10-CM

## 2019-05-29 NOTE — H&P (View-Only) (Signed)
Cardiology Office Note:    Date:  05/29/2019   ID:  [name], DOB [birth year], MRN [id number]  PCP:  [name], MD  Cardiologist:  Quay Burow, MD  Electrophysiologist:  None   Referring MD: [name], MD   No chief complaint on file.   History of Present Illness:    [name] is a 63 y.o. male with a hx of minimally invasive MV repair May 2020.  He's had PAF and had DCCV March 2020, he had gained 30 lbs of fluid before that was done. Post MV repair he was in NSR but had transient junctional rhythm and long PR and briefly  Required a T-TVDP.  He went into AF before discharge and was placed on Amiodarone. He presents to the office today to arrange OP DCCV.    Since discharge he has gained some weight back but has no significant edema or orthopnea.  He remains in AF with CVR with RBBB.  He is on Coumadin, last INR 7/23.   Past Medical History:  Diagnosis Date  . Acute on chronic systolic (congestive) heart failure ([place]) 12/30/2018  . Allergy   . Arthritis   . Atrial flutter with rapid ventricular response ([place])   . Chronic systolic (congestive) heart failure ([place])   . Dilated aortic root ([place]) 01/04/2019  . Dilated cardiomyopathy ([place]) 01/04/2019  . GERD (gastroesophageal reflux disease)    history of  . Heart murmur   . History of colon polyps   . Hypertension   . Insomnia   . Mitral valve prolapse   . Mitral valve regurgitation   . Persistent atrial fibrillation 12/02/2018  . S/P Maze operation for atrial fibrillation 03/21/2019   Complete bilateral atrial lesion set using cryothermy and bipolar radiofrequency ablation with clipping of LA appendage via right mini thoracotomy approach  . S/P minimally invasive mitral valve repair 03/21/2019   Complex valvuloplasty including triangular resection of posterior leaflet, artificial Gore-tex neochord placement x4 and 36 mm Sorin Memo 4D ring annuloplasty via right mini thoracotomy approach  . Seizures ([place])    had one  approx. 30 yrs ago,has not had any since  . Severe mitral regurgitation     Past Surgical History:  Procedure Laterality Date  . ANKLE FUSION  left   Dec. 2012  . ARTHROSCOPY KNEE W/ DRILLING  bilateral   2012  . CARDIOVERSION N/A 01/03/2019   Procedure: CARDIOVERSION;  Surgeon: Sueanne Margarita, MD;  Location: [facility] ENDOSCOPY;  Service: Cardiovascular;  Laterality: N/A;  . CLIPPING OF ATRIAL APPENDAGE  03/21/2019   Procedure: Clipping Of Atrial Appendage using 80mm Atricure Pro2 Clip;  Surgeon: Rexene Alberts, MD;  Location: [facility];  Service: Open Heart Surgery;;  . COLONOSCOPY     x2  . FOOT ARTHRODESIS  06/23/2012   Procedure: ARTHRODESIS FOOT;  Surgeon: Wylene Simmer, MD;  Location: [facility];  Service: Orthopedics;  Laterality: Left;  Left Subtalar and Talonavicular Joint Revision Arthrodesis  Aspiration of Bone Marrow from Left Hip   . HAIR TRANSPLANT    . HARDWARE REMOVAL  06/23/2012   Procedure: HARDWARE REMOVAL;  Surgeon: Wylene Simmer, MD;  Location: [facility];  Service: Orthopedics;  Laterality: Left;  Removal of Deep Implant  X's 3  . INSERTION OF MESH N/A 07/10/2016   Procedure: INSERTION OF MESH;  Surgeon: Coralie Keens, MD;  Location: [facility];  Service: General;  Laterality: N/A;  . JOINT REPLACEMENT     right hip  01-2011  .  LEFT HEART CATH AND CORONARY ANGIOGRAPHY N/A 03/16/2019   Procedure: LEFT HEART CATH AND CORONARY ANGIOGRAPHY;  Surgeon: Sherren Mocha, MD;  Location: [facility] CV LAB;  Service: Cardiovascular;  Laterality: N/A;  . LIMB SPARING RESECTION HIP W/ SADDLE JOINT REPLACEMENT Right   . MINIMALLY INVASIVE MAZE PROCEDURE N/A 03/21/2019   Procedure: MINIMALLY INVASIVE MAZE PROCEDURE;  Surgeon: Rexene Alberts, MD;  Location: [place] [facility];  Service: Open Heart Surgery;  Laterality: N/A;  . MITRAL VALVE REPAIR Right 03/21/2019   Procedure: MINIMALLY INVASIVE MITRAL VALVE REPAIR (MVR) using Memo 4D ring size 36;  Surgeon: Rexene Alberts, MD;  Location: [facility];   Service: Open Heart Surgery;  Laterality: Right;  . TEE WITHOUT CARDIOVERSION N/A 01/03/2019   Procedure: TRANSESOPHAGEAL ECHOCARDIOGRAM (TEE);  Surgeon: Sueanne Margarita, MD;  Location: [facility] ENDOSCOPY;  Service: Cardiovascular;  Laterality: N/A;  . TEE WITHOUT CARDIOVERSION N/A 03/21/2019   Procedure: TRANSESOPHAGEAL ECHOCARDIOGRAM (TEE);  Surgeon: Rexene Alberts, MD;  Location: [facility];  Service: Open Heart Surgery;  Laterality: N/A;  . TEMPORARY PACEMAKER N/A 03/22/2019   Procedure: TEMPORARY PACEMAKER;  Surgeon: Belva Crome, MD;  Location: [facility] CV LAB;  Service: Cardiovascular;  Laterality: N/A;  . TOTAL KNEE ARTHROPLASTY Right 01/19/2014   Procedure: RIGHT TOTAL KNEE ARTHROPLASTY, Steroid injection left knee;  Surgeon: Mcarthur Rossetti, MD;  Location: [facility];  Service: Orthopedics;  Laterality: Right;  . TOTAL KNEE ARTHROPLASTY Left 10/23/2014   Procedure: LEFT TOTAL KNEE ARTHROPLASTY;  Surgeon: Mcarthur Rossetti, MD;  Location: [facility];  Service: Orthopedics;  Laterality: Left;  . UMBILICAL HERNIA REPAIR N/A 07/10/2016   Procedure: UMBILICAL HERNIA REPAIR;  Surgeon: Coralie Keens, MD;  Location: [facility];  Service: General;  Laterality: N/A;    Current Medications: Current Meds  Medication Sig  . amiodarone (PACERONE) 200 MG tablet Take 1 tablet (200 mg total) by mouth daily.  Marland Kitchen aspirin EC 81 MG EC tablet Take 1 tablet (81 mg total) by mouth daily.  Marland Kitchen diltiazem 2 % GEL Apply 1 application topically 3 (three) times daily. With 5 % lidocaine  . docusate sodium (COLACE) 100 MG capsule Take 300 mg by mouth daily.  . ferrous [id number] C-folic acid (TRINSICON / FOLTRIN) capsule Take 1 capsule by mouth daily with breakfast.  . KLOR-CON M10 10 MEQ tablet Take 10 mEq by mouth 2 (two) times daily.  . metoprolol tartrate (LOPRESSOR) 25 MG tablet Take 0.5 tablets (12.5 mg total) by mouth 2 (two) times daily.  . Multiple Vitamin (MULTIVITAMIN WITH MINERALS)  TABS tablet Take 1 tablet by mouth daily.  . sacubitril-valsartan (ENTRESTO) 24-26 MG Take 1 tablet by mouth 2 (two) times daily.  Marland Kitchen spironolactone (ALDACTONE) 25 MG tablet Take 1 tablet (25 mg total) by mouth daily.  Marland Kitchen torsemide (DEMADEX) 20 MG tablet Take 1 tablet (20 mg total) by mouth daily.  Marland Kitchen warfarin (COUMADIN) 5 MG tablet Take 1 tablet (5 mg total) by mouth daily. As directed by coumadin clinic     Allergies:   Patient has no known allergies.   Social History   Socioeconomic History  . Marital status: Married    Spouse name: Not on file  . Number of children: 1  . Years of education: Not on file  . Highest education level: Not on file  Occupational History  . Occupation: Pension scheme manager  Social Needs  . Financial resource strain: Not on file  . Food insecurity    Worry: Not on  file    Inability: Not on file  . Transportation needs    Medical: Not on file    Non-medical: Not on file  Tobacco Use  . Smoking status: Never Smoker  . Smokeless tobacco: Never Used  Substance and Sexual Activity  . Alcohol use: Yes    Comment: 1 or 2 drink daily  . Drug use: No  . Sexual activity: Yes  Lifestyle  . Physical activity    Days per week: Not on file    Minutes per session: Not on file  . Stress: Not on file  Relationships  . Social Herbalist on phone: Not on file    Gets together: Not on file    Attends religious service: Not on file    Active member of club or organization: Not on file    Attends meetings of clubs or organizations: Not on file    Relationship status: Not on file  Other Topics Concern  . Not on file  Social History Narrative  . Not on file     Family History: The patient's family history includes Colon cancer in his father; Diabetes in his mother; Hypertension in his father. There is no history of Stomach cancer, Esophageal cancer, Pancreatic cancer, or Rectal cancer.  ROS:   Please see the history of present illness.     All other  systems reviewed and are negative.  EKGs/Labs/Other Studies Reviewed:    The following studies were reviewed today:  EKG:  EKG is ordered today.  The ekg ordered today demonstrates AF with RBBB, HR 90  Recent Labs: 12/30/2018: B Natriuretic Peptide 1,365.8 03/15/2019: TSH 4.420 03/16/2019: ALT 32 03/24/2019: Hemoglobin 10.2; Platelets 72 03/25/2019: Magnesium 2.2 03/26/2019: BUN 18; Creatinine, Ser 1.00; Potassium 3.9; Sodium 137  Recent Lipid Panel No results found for: CHOL, TRIG, HDL, CHOLHDL, VLDL, LDLCALC, LDLDIRECT  Physical Exam:    VS:  BP 126/81   Pulse 83   Temp (!) 96.6 F (35.9 C)   Ht 6\' 3"  (1.905 m)   Wt 249 lb (112.9 kg)   SpO2 97%   BMI 31.12 kg/m     Wt Readings from Last 3 Encounters:  05/29/19 249 lb (112.9 kg)  05/22/19 248 lb (112.5 kg)  04/10/19 241 lb (109.3 kg)     GEN:  Well nourished, well developed in no acute distress HEENT: Normal NECK: No JVD; No carotid bruits LYMPHATICS: No lymphadenopathy CARDIAC: irregularly irregular no murmurs, rubs, gallops RESPIRATORY:  Clear to auscultation without rales, wheezing or rhonchi  ABDOMEN: Soft, non-tender, non-distended MUSCULOSKELETAL:  Trace edema and chronic skin changes LLE (old injury). No edema RLE. No deformity  SKIN: Warm and dry NEUROLOGIC:  Alert and oriented x 3 PSYCHIATRIC:  Normal affect   ASSESSMENT:    Persistent atrial fibrillation Pt seen today to arrange cardioversion  S/P minimally invasive mitral valve repair  S/P surgery 03/21/2019  S/P Maze operation for atrial fibrillation ablation with clipping of LA appendage via right mini thoracotomy approach  Essential hypertension Controlled  PLAN:    He will need 4 weeks of therapeutic INR documented. I have spoken with pharmacy and they will arrange this. I discussed risks of cardioversion including a pacemaker and he understands and is willing to proceed.  Procedure scheduled for Aug 21st, he will have his labs including INR 3  days prior.     Medication Adjustments/Labs and Tests Ordered: Current medicines are reviewed at length with the patient today.  Concerns regarding  medicines are outlined above.  No orders of the defined types were placed in this encounter.  No orders of the defined types were placed in this encounter.   There are no Patient Instructions on file for this visit.   Angelena Form, PA-C  05/29/2019 11:44 AM    [facility]

## 2019-05-29 NOTE — Assessment & Plan Note (Signed)
Controlled.  

## 2019-05-29 NOTE — Patient Instructions (Addendum)
Medication Instructions:  Your physician recommends that you continue on your current medications as directed. Please refer to the Current Medication list given to you today. If you need a refill on your cardiac medications before your next appointment, please call your pharmacy.   Lab work: Your physician recommends that you return for lab work in: 3 DAYS before cardioversion (06/23/2019) BMET,CBC If you have labs (blood work) drawn today and your tests are completely normal, you will receive your results only by: Marland Kitchen MyChart Message (if you have MyChart) OR . A paper copy in the mail If you have any lab test that is abnormal or we need to change your treatment, we will call you to review the results.  Testing/Procedures: Your physician has recommended that you have a Cardioversion (DCCV). Electrical Cardioversion uses a jolt of electricity to your heart either through paddles or wired patches attached to your chest. This is a controlled, usually prescheduled, procedure. Defibrillation is done under light anesthesia in the hospital, and you usually go home the day of the procedure. This is done to get your heart back into a normal rhythm. You are not awake for the procedure. Please see the instruction sheet given to you today. SEE INSTRUCTIONS BELOW  Follow-Up: At [facility], you and your health needs are our priority.  As part of our continuing mission to provide you with exceptional heart care, we have created designated Provider Care Teams.  These Care Teams include your primary Cardiologist (physician) and Advanced Practice Providers (APPs -  Physician Assistants and Nurse Practitioners) who all work together to provide you with the care you need, when you need it. . FOLLOW UP AS SCHEDULED WITH DR BERRY  Any Other Special Instructions Will Be Listed Below (If Applicable). ONE OF THE PHARMACISTS FROM OUR OFFICE WILL BE IN TOUCH WITH YOU ABOUT AN APPOINTMENT TO HAVE YOUR COUMADIN  CHECKED.      CARDIOVERSION INSTRUCTIONS   Dear Vella Raring You are scheduled for a Cardioversion. Cardioversion on 06/23/2019 with Dr. Dorris Carnes.  Please arrive at the [facility] (Main Entrance A) at [facility]: [address] [place], [place] [address] at 9:30 am. (1 hour prior to procedure unless lab work is needed; if lab work is needed arrive 1.5 hours ahead)  DIET:  Nothing to eat or drink after midnight except a sip of water with medications.  Medication Instructions: Continue your anticoagulant: COUMADIN You will need to continue your anticoagulant after your procedure until you are told by your Provider that it is safe to stop  Labs:  Come to the lab at [facility] between the hours of 8:00 am and 4:30 pm. You do not have to be fasting.  You must have a responsible person to drive you home and stay in the waiting area during your procedure. Failure to do so could result in cancellation.  Bring your insurance cards.  *Special Note: Every effort is made to have your procedure done on time. Occasionally there are emergencies that occur at the hospital that may cause delays. Please be patient if a delay does occur.

## 2019-05-29 NOTE — Assessment & Plan Note (Signed)
ablation with clipping of LA appendage via right mini thoracotomy approach

## 2019-05-29 NOTE — Addendum Note (Signed)
Addended by: Ulice Brilliant T on: 05/29/2019 12:23 PM   Modules accepted: Orders

## 2019-05-29 NOTE — Progress Notes (Signed)
Cardiology Office Note:    Date:  05/29/2019   ID:  [name], DOB [birth year], MRN [id number]  PCP:  [name], MD  Cardiologist:  Quay Burow, MD  Electrophysiologist:  None   Referring MD: [name], MD   No chief complaint on file.   History of Present Illness:    [name] is a 63 y.o. male with a hx of minimally invasive MV repair May 2020.  He's had PAF and had DCCV March 2020, he had gained 30 lbs of fluid before that was done. Post MV repair he was in NSR but had transient junctional rhythm and long PR and briefly  Required a T-TVDP.  He went into AF before discharge and was placed on Amiodarone. He presents to the office today to arrange OP DCCV.    Since discharge he has gained some weight back but has no significant edema or orthopnea.  He remains in AF with CVR with RBBB.  He is on Coumadin, last INR 7/23.   Past Medical History:  Diagnosis Date  . Acute on chronic systolic (congestive) heart failure ([place]) 12/30/2018  . Allergy   . Arthritis   . Atrial flutter with rapid ventricular response ([place])   . Chronic systolic (congestive) heart failure ([place])   . Dilated aortic root ([place]) 01/04/2019  . Dilated cardiomyopathy ([place]) 01/04/2019  . GERD (gastroesophageal reflux disease)    history of  . Heart murmur   . History of colon polyps   . Hypertension   . Insomnia   . Mitral valve prolapse   . Mitral valve regurgitation   . Persistent atrial fibrillation 12/02/2018  . S/P Maze operation for atrial fibrillation 03/21/2019   Complete bilateral atrial lesion set using cryothermy and bipolar radiofrequency ablation with clipping of LA appendage via right mini thoracotomy approach  . S/P minimally invasive mitral valve repair 03/21/2019   Complex valvuloplasty including triangular resection of posterior leaflet, artificial Gore-tex neochord placement x4 and 36 mm Sorin Memo 4D ring annuloplasty via right mini thoracotomy approach  . Seizures ([place])    had one  approx. 30 yrs ago,has not had any since  . Severe mitral regurgitation     Past Surgical History:  Procedure Laterality Date  . ANKLE FUSION  left   Dec. 2012  . ARTHROSCOPY KNEE W/ DRILLING  bilateral   2012  . CARDIOVERSION N/A 01/03/2019   Procedure: CARDIOVERSION;  Surgeon: Sueanne Margarita, MD;  [facility] ENDOSCOPY;  Service: Cardiovascular;  Laterality: N/A;  . CLIPPING OF ATRIAL APPENDAGE  03/21/2019   Procedure: Clipping Of Atrial Appendage using 84mm Atricure Pro2 Clip;  Surgeon: Rexene Alberts, MD;  Location: [place];  Service: Open Heart Surgery;;  . COLONOSCOPY     x2  . FOOT ARTHRODESIS  06/23/2012   Procedure: ARTHRODESIS FOOT;  Surgeon: Wylene Simmer, MD;  Location: [place];  Service: Orthopedics;  Laterality: Left;  Left Subtalar and Talonavicular Joint Revision Arthrodesis  Aspiration of Bone Marrow from Left Hip   . HAIR TRANSPLANT    . HARDWARE REMOVAL  06/23/2012   Procedure: HARDWARE REMOVAL;  Surgeon: Wylene Simmer, MD;  Location: [place];  Service: Orthopedics;  Laterality: Left;  Removal of Deep Implant  X's 3  . INSERTION OF MESH N/A 07/10/2016   Procedure: INSERTION OF MESH;  Surgeon: Coralie Keens, MD;  Location: [place];  Service: General;  Laterality: N/A;  . JOINT REPLACEMENT     right hip  01-2011  .  LEFT HEART CATH AND CORONARY ANGIOGRAPHY N/A 03/16/2019   Procedure: LEFT HEART CATH AND CORONARY ANGIOGRAPHY;  Surgeon: Sherren Mocha, MD;  Location: [place] CV LAB;  Service: Cardiovascular;  Laterality: N/A;  . LIMB SPARING RESECTION HIP W/ SADDLE JOINT REPLACEMENT Right   . MINIMALLY INVASIVE MAZE PROCEDURE N/A 03/21/2019   Procedure: MINIMALLY INVASIVE MAZE PROCEDURE;  Surgeon: Rexene Alberts, MD;  Location: [place];  Service: Open Heart Surgery;  Laterality: N/A;  . MITRAL VALVE REPAIR Right 03/21/2019   Procedure: MINIMALLY INVASIVE MITRAL VALVE REPAIR (MVR) using Memo 4D ring size 36;  Surgeon: Rexene Alberts, MD;  Location: [place];   Service: Open Heart Surgery;  Laterality: Right;  . TEE WITHOUT CARDIOVERSION N/A 01/03/2019   Procedure: TRANSESOPHAGEAL ECHOCARDIOGRAM (TEE);  Surgeon: Sueanne Margarita, MD;  Location: [facility] ENDOSCOPY;  Service: Cardiovascular;  Laterality: N/A;  . TEE WITHOUT CARDIOVERSION N/A 03/21/2019   Procedure: TRANSESOPHAGEAL ECHOCARDIOGRAM (TEE);  Surgeon: Rexene Alberts, MD;  Location: [place];  Service: Open Heart Surgery;  Laterality: N/A;  . TEMPORARY PACEMAKER N/A 03/22/2019   Procedure: TEMPORARY PACEMAKER;  Surgeon: Belva Crome, MD;  Location: [place] CV LAB;  Service: Cardiovascular;  Laterality: N/A;  . TOTAL KNEE ARTHROPLASTY Right 01/19/2014   Procedure: RIGHT TOTAL KNEE ARTHROPLASTY, Steroid injection left knee;  Surgeon: Mcarthur Rossetti, MD;  Location: [facility];  Service: Orthopedics;  Laterality: Right;  . TOTAL KNEE ARTHROPLASTY Left 10/23/2014   Procedure: LEFT TOTAL KNEE ARTHROPLASTY;  Surgeon: Mcarthur Rossetti, MD;  Location: [facility];  Service: Orthopedics;  Laterality: Left;  . UMBILICAL HERNIA REPAIR N/A 07/10/2016   Procedure: UMBILICAL HERNIA REPAIR;  Surgeon: Coralie Keens, MD;  Location: [place];  Service: General;  Laterality: N/A;    Current Medications: Current Meds  Medication Sig  . amiodarone (PACERONE) 200 MG tablet Take 1 tablet (200 mg total) by mouth daily.  Marland Kitchen aspirin EC 81 MG EC tablet Take 1 tablet (81 mg total) by mouth daily.  Marland Kitchen diltiazem 2 % GEL Apply 1 application topically 3 (three) times daily. With 5 % lidocaine  . docusate sodium (COLACE) 100 MG capsule Take 300 mg by mouth daily.  . ferrous [id number] C-folic acid (TRINSICON / FOLTRIN) capsule Take 1 capsule by mouth daily with breakfast.  . KLOR-CON M10 10 MEQ tablet Take 10 mEq by mouth 2 (two) times daily.  . metoprolol tartrate (LOPRESSOR) 25 MG tablet Take 0.5 tablets (12.5 mg total) by mouth 2 (two) times daily.  . Multiple Vitamin (MULTIVITAMIN WITH MINERALS)  TABS tablet Take 1 tablet by mouth daily.  . sacubitril-valsartan (ENTRESTO) 24-26 MG Take 1 tablet by mouth 2 (two) times daily.  Marland Kitchen spironolactone (ALDACTONE) 25 MG tablet Take 1 tablet (25 mg total) by mouth daily.  Marland Kitchen torsemide (DEMADEX) 20 MG tablet Take 1 tablet (20 mg total) by mouth daily.  Marland Kitchen warfarin (COUMADIN) 5 MG tablet Take 1 tablet (5 mg total) by mouth daily. As directed by coumadin clinic     Allergies:   Patient has no known allergies.   Social History   Socioeconomic History  . Marital status: Married    Spouse name: Not on file  . Number of children: 1  . Years of education: Not on file  . Highest education level: Not on file  Occupational History  . Occupation: Pension scheme manager  Social Needs  . Financial resource strain: Not on file  . Food insecurity    Worry: Not on  file    Inability: Not on file  . Transportation needs    Medical: Not on file    Non-medical: Not on file  Tobacco Use  . Smoking status: Never Smoker  . Smokeless tobacco: Never Used  Substance and Sexual Activity  . Alcohol use: Yes    Comment: 1 or 2 drink daily  . Drug use: No  . Sexual activity: Yes  Lifestyle  . Physical activity    Days per week: Not on file    Minutes per session: Not on file  . Stress: Not on file  Relationships  . Social Herbalist on phone: Not on file    Gets together: Not on file    Attends religious service: Not on file    Active member of club or organization: Not on file    Attends meetings of clubs or organizations: Not on file    Relationship status: Not on file  Other Topics Concern  . Not on file  Social History Narrative  . Not on file     Family History: The patient's family history includes Colon cancer in his father; Diabetes in his mother; Hypertension in his father. There is no history of Stomach cancer, Esophageal cancer, Pancreatic cancer, or Rectal cancer.  ROS:   Please see the history of present illness.     All other  systems reviewed and are negative.  EKGs/Labs/Other Studies Reviewed:    The following studies were reviewed today:  EKG:  EKG is ordered today.  The ekg ordered today demonstrates AF with RBBB, HR 90  Recent Labs: 12/30/2018: B Natriuretic Peptide 1,365.8 03/15/2019: TSH 4.420 03/16/2019: ALT 32 03/24/2019: Hemoglobin 10.2; Platelets 72 03/25/2019: Magnesium 2.2 03/26/2019: BUN 18; Creatinine, Ser 1.00; Potassium 3.9; Sodium 137  Recent Lipid Panel No results found for: CHOL, TRIG, HDL, CHOLHDL, VLDL, LDLCALC, LDLDIRECT  Physical Exam:    VS:  BP 126/81   Pulse 83   Temp (!) 96.6 F (35.9 C)   Ht 6\' 3"  (1.905 m)   Wt 249 lb (112.9 kg)   SpO2 97%   BMI 31.12 kg/m     Wt Readings from Last 3 Encounters:  05/29/19 249 lb (112.9 kg)  05/22/19 248 lb (112.5 kg)  04/10/19 241 lb (109.3 kg)     GEN:  Well nourished, well developed in no acute distress HEENT: Normal NECK: No JVD; No carotid bruits LYMPHATICS: No lymphadenopathy CARDIAC: irregularly irregular no murmurs, rubs, gallops RESPIRATORY:  Clear to auscultation without rales, wheezing or rhonchi  ABDOMEN: Soft, non-tender, non-distended MUSCULOSKELETAL:  Trace edema and chronic skin changes LLE (old injury). No edema RLE. No deformity  SKIN: Warm and dry NEUROLOGIC:  Alert and oriented x 3 PSYCHIATRIC:  Normal affect   ASSESSMENT:    Persistent atrial fibrillation Pt seen today to arrange cardioversion  S/P minimally invasive mitral valve repair  S/P surgery 03/21/2019  S/P Maze operation for atrial fibrillation ablation with clipping of LA appendage via right mini thoracotomy approach  Essential hypertension Controlled  PLAN:    He will need 4 weeks of therapeutic INR documented. I have spoken with pharmacy and they will arrange this. I discussed risks of cardioversion including a pacemaker and he understands and is willing to proceed.  Procedure scheduled for Aug 21st, he will have his labs including INR 3  days prior.     Medication Adjustments/Labs and Tests Ordered: Current medicines are reviewed at length with the patient today.  Concerns regarding  medicines are outlined above.  No orders of the defined types were placed in this encounter.  No orders of the defined types were placed in this encounter.   There are no Patient Instructions on file for this visit.   Angelena Form, PA-C  05/29/2019 11:44 AM    [facility]

## 2019-05-29 NOTE — Assessment & Plan Note (Signed)
Pt seen today to arrange cardioversion

## 2019-05-29 NOTE — Assessment & Plan Note (Signed)
S/P surgery 03/21/2019

## 2019-06-01 ENCOUNTER — Ambulatory Visit (INDEPENDENT_AMBULATORY_CARE_PROVIDER_SITE_OTHER): Payer: BC Managed Care – PPO | Admitting: Pharmacist Clinician (PhC)/ Clinical Pharmacy Specialist

## 2019-06-01 ENCOUNTER — Other Ambulatory Visit: Payer: Self-pay

## 2019-06-01 DIAGNOSIS — I4892 Unspecified atrial flutter: Secondary | ICD-10-CM | POA: Diagnosis not present

## 2019-06-01 DIAGNOSIS — Z5181 Encounter for therapeutic drug level monitoring: Secondary | ICD-10-CM | POA: Diagnosis not present

## 2019-06-01 DIAGNOSIS — I4819 Other persistent atrial fibrillation: Secondary | ICD-10-CM

## 2019-06-01 LAB — POCT INR: INR: 2.2 (ref 2.0–3.0)

## 2019-06-08 ENCOUNTER — Ambulatory Visit (INDEPENDENT_AMBULATORY_CARE_PROVIDER_SITE_OTHER): Payer: BC Managed Care – PPO | Admitting: Pharmacist

## 2019-06-08 ENCOUNTER — Other Ambulatory Visit: Payer: Self-pay

## 2019-06-08 DIAGNOSIS — Z5181 Encounter for therapeutic drug level monitoring: Secondary | ICD-10-CM

## 2019-06-08 DIAGNOSIS — I4819 Other persistent atrial fibrillation: Secondary | ICD-10-CM

## 2019-06-08 DIAGNOSIS — I4892 Unspecified atrial flutter: Secondary | ICD-10-CM

## 2019-06-08 LAB — POCT INR: INR: 2.2 (ref 2.0–3.0)

## 2019-06-15 ENCOUNTER — Other Ambulatory Visit: Payer: Self-pay

## 2019-06-15 ENCOUNTER — Ambulatory Visit (INDEPENDENT_AMBULATORY_CARE_PROVIDER_SITE_OTHER): Payer: BC Managed Care – PPO | Admitting: Pharmacist

## 2019-06-15 DIAGNOSIS — I4819 Other persistent atrial fibrillation: Secondary | ICD-10-CM | POA: Diagnosis not present

## 2019-06-15 DIAGNOSIS — Z5181 Encounter for therapeutic drug level monitoring: Secondary | ICD-10-CM

## 2019-06-15 DIAGNOSIS — I4892 Unspecified atrial flutter: Secondary | ICD-10-CM

## 2019-06-15 LAB — POCT INR: INR: 2.4 (ref 2.0–3.0)

## 2019-06-19 DIAGNOSIS — I4819 Other persistent atrial fibrillation: Secondary | ICD-10-CM | POA: Diagnosis not present

## 2019-06-19 DIAGNOSIS — Z9889 Other specified postprocedural states: Secondary | ICD-10-CM | POA: Diagnosis not present

## 2019-06-19 DIAGNOSIS — I10 Essential (primary) hypertension: Secondary | ICD-10-CM | POA: Diagnosis not present

## 2019-06-19 DIAGNOSIS — Z8679 Personal history of other diseases of the circulatory system: Secondary | ICD-10-CM | POA: Diagnosis not present

## 2019-06-20 ENCOUNTER — Other Ambulatory Visit (HOSPITAL_COMMUNITY)
Admission: RE | Admit: 2019-06-20 | Discharge: 2019-06-20 | Disposition: A | Discharge status: Home / Self Care | Payer: BC Managed Care – PPO | Source: Ambulatory Visit | Attending: Internal Medicine | Admitting: Internal Medicine

## 2019-06-20 DIAGNOSIS — Z20828 Contact with and (suspected) exposure to other viral communicable diseases: Secondary | ICD-10-CM | POA: Insufficient documentation

## 2019-06-20 DIAGNOSIS — Z01812 Encounter for preprocedural laboratory examination: Secondary | ICD-10-CM | POA: Diagnosis not present

## 2019-06-20 LAB — CBC WITH DIFFERENTIAL/PLATELET
Basophils Absolute: 0 10*3/uL (ref 0.0–0.2)
Basos: 0 %
EOS (ABSOLUTE): 0.2 10*3/uL (ref 0.0–0.4)
Eos: 3 %
Hematocrit: 45.1 % (ref 37.5–51.0)
Hemoglobin: 14.9 g/dL (ref 13.0–17.7)
Immature Grans (Abs): 0 10*3/uL (ref 0.0–0.1)
Immature Granulocytes: 0 %
Lymphocytes Absolute: 1.3 10*3/uL (ref 0.7–3.1)
Lymphs: 18 %
MCH: 31.3 pg (ref 26.6–33.0)
MCHC: 33 g/dL (ref 31.5–35.7)
MCV: 95 fL (ref 79–97)
Monocytes Absolute: 0.7 10*3/uL (ref 0.1–0.9)
Monocytes: 9 %
Neutrophils Absolute: 4.9 10*3/uL (ref 1.4–7.0)
Neutrophils: 70 %
Platelets: 182 10*3/uL (ref 150–450)
RBC: 4.76 x10E6/uL (ref 4.14–5.80)
RDW: 13.3 % (ref 11.6–15.4)
WBC: 7 10*3/uL (ref 3.4–10.8)

## 2019-06-20 LAB — BASIC METABOLIC PANEL
BUN/Creatinine Ratio: 28 — ABNORMAL HIGH (ref 10–24)
BUN: 35 mg/dL — ABNORMAL HIGH (ref 8–27)
CO2: 25 mmol/L (ref 20–29)
Calcium: 9.3 mg/dL (ref 8.6–10.2)
Chloride: 100 mmol/L (ref 96–106)
Creatinine, Ser: 1.26 mg/dL (ref 0.76–1.27)
GFR calc Af Amer: 69 mL/min/{1.73_m2} (ref >59)
GFR calc non Af Amer: 60 mL/min/{1.73_m2} (ref >59)
Glucose: 91 mg/dL (ref 65–99)
Potassium: 4.4 mmol/L (ref 3.5–5.2)
Sodium: 141 mmol/L (ref 134–144)

## 2019-06-20 LAB — MAGNESIUM: Magnesium: 2.1 mg/dL (ref 1.6–2.3)

## 2019-06-20 LAB — SARS CORONAVIRUS 2 (TAT 6-24 HRS): SARS Coronavirus 2: NEGATIVE

## 2019-06-23 ENCOUNTER — Other Ambulatory Visit: Payer: Self-pay

## 2019-06-23 ENCOUNTER — Ambulatory Visit (HOSPITAL_COMMUNITY): Payer: BC Managed Care – PPO | Admitting: Anesthesiology

## 2019-06-23 ENCOUNTER — Ambulatory Visit (HOSPITAL_COMMUNITY)
Admission: RE | Admit: 2019-06-23 | Discharge: 2019-06-23 | Disposition: A | Discharge status: Home / Self Care | Payer: BC Managed Care – PPO | Source: Ambulatory Visit | Attending: Internal Medicine | Admitting: Internal Medicine

## 2019-06-23 ENCOUNTER — Encounter (HOSPITAL_COMMUNITY)
Admission: RE | Disposition: A | Discharge status: Home / Self Care | Payer: Self-pay | Source: Ambulatory Visit | Attending: Internal Medicine

## 2019-06-23 ENCOUNTER — Encounter (HOSPITAL_COMMUNITY): Payer: Self-pay | Admitting: *Deleted

## 2019-06-23 DIAGNOSIS — I420 Dilated cardiomyopathy: Secondary | ICD-10-CM | POA: Diagnosis not present

## 2019-06-23 DIAGNOSIS — Z7982 Long term (current) use of aspirin: Secondary | ICD-10-CM | POA: Diagnosis not present

## 2019-06-23 DIAGNOSIS — I5022 Chronic systolic (congestive) heart failure: Secondary | ICD-10-CM | POA: Diagnosis not present

## 2019-06-23 DIAGNOSIS — I110 Hypertensive heart disease with heart failure: Secondary | ICD-10-CM | POA: Insufficient documentation

## 2019-06-23 DIAGNOSIS — I341 Nonrheumatic mitral (valve) prolapse: Secondary | ICD-10-CM | POA: Insufficient documentation

## 2019-06-23 DIAGNOSIS — I4510 Unspecified right bundle-branch block: Secondary | ICD-10-CM | POA: Diagnosis not present

## 2019-06-23 DIAGNOSIS — Z7901 Long term (current) use of anticoagulants: Secondary | ICD-10-CM | POA: Diagnosis not present

## 2019-06-23 DIAGNOSIS — I4892 Unspecified atrial flutter: Secondary | ICD-10-CM | POA: Diagnosis not present

## 2019-06-23 DIAGNOSIS — I4819 Other persistent atrial fibrillation: Secondary | ICD-10-CM | POA: Diagnosis not present

## 2019-06-23 DIAGNOSIS — I5023 Acute on chronic systolic (congestive) heart failure: Secondary | ICD-10-CM | POA: Diagnosis not present

## 2019-06-23 DIAGNOSIS — I340 Nonrheumatic mitral (valve) insufficiency: Secondary | ICD-10-CM | POA: Diagnosis not present

## 2019-06-23 DIAGNOSIS — G4700 Insomnia, unspecified: Secondary | ICD-10-CM | POA: Diagnosis not present

## 2019-06-23 DIAGNOSIS — M1990 Unspecified osteoarthritis, unspecified site: Secondary | ICD-10-CM | POA: Insufficient documentation

## 2019-06-23 DIAGNOSIS — K219 Gastro-esophageal reflux disease without esophagitis: Secondary | ICD-10-CM | POA: Diagnosis not present

## 2019-06-23 DIAGNOSIS — I4891 Unspecified atrial fibrillation: Secondary | ICD-10-CM | POA: Diagnosis not present

## 2019-06-23 DIAGNOSIS — Z79899 Other long term (current) drug therapy: Secondary | ICD-10-CM | POA: Diagnosis not present

## 2019-06-23 HISTORY — PX: CARDIOVERSION: SHX1299

## 2019-06-23 LAB — PROTIME-INR
INR: 2.4 — ABNORMAL HIGH (ref 0.8–1.2)
Prothrombin Time: 25.4 seconds — ABNORMAL HIGH (ref 11.4–15.2)

## 2019-06-23 SURGERY — CARDIOVERSION
Anesthesia: General

## 2019-06-23 MED ORDER — PROPOFOL 10 MG/ML IV BOLUS
INTRAVENOUS | Status: DC | PRN
  Administered 2019-06-23 (×4): 50 mg via INTRAVENOUS

## 2019-06-23 MED ORDER — SODIUM CHLORIDE 0.9 % IV SOLN
INTRAVENOUS | Status: DC
  Administered 2019-06-23: 500 mL via INTRAVENOUS

## 2019-06-23 MED ORDER — LIDOCAINE 2% (20 MG/ML) 5 ML SYRINGE
INTRAMUSCULAR | Status: DC | PRN
  Administered 2019-06-23: 60 mg via INTRAVENOUS

## 2019-06-23 NOTE — Interval H&P Note (Signed)
History and Physical Interval Note:  06/23/2019 10:37 AM  [name]  has presented today for surgery, with the diagnosis of A-FIB.  The various methods of treatment have been discussed with the patient and family. After consideration of risks, benefits and other options for treatment, the patient has consented to  Procedure(s): CARDIOVERSION (N/A) as a surgical intervention.  The patient's history has been reviewed, patient examined, no change in status, stable for surgery.  I have reviewed the patient's chart and labs.  Questions were answered to the patient's satisfaction.     Dorris Carnes

## 2019-06-23 NOTE — Transfer of Care (Signed)
Immediate Anesthesia Transfer of Care Note  Patient: [name]  Procedure(s) Performed: CARDIOVERSION (N/A )  Patient Location: PACU  Anesthesia Type:General  Level of Consciousness: awake, drowsy and patient cooperative  Airway & Oxygen Therapy: Patient Spontanous Breathing and Patient connected to nasal cannula oxygen  Post-op Assessment: Report given to RN and Post -op Vital signs reviewed and stable  Post vital signs: Reviewed and stable  Last Vitals:  Vitals Value Taken Time  BP    Temp    Pulse    Resp    SpO2      Last Pain:  Vitals:   06/23/19 1000  TempSrc: Oral  PainSc: 0-No pain         Complications: No apparent anesthesia complications

## 2019-06-23 NOTE — Anesthesia Postprocedure Evaluation (Signed)
Anesthesia Post Note  Patient: [name]  Procedure(s) Performed: CARDIOVERSION (N/A )     Patient location during evaluation: Endoscopy Anesthesia Type: General Level of consciousness: awake and alert Pain management: pain level controlled Vital Signs Assessment: post-procedure vital signs reviewed and stable Respiratory status: spontaneous breathing, nonlabored ventilation, respiratory function stable and patient connected to nasal cannula oxygen Cardiovascular status: blood pressure returned to baseline and stable Postop Assessment: no apparent nausea or vomiting Anesthetic complications: no    Last Vitals:  Vitals:   06/23/19 1145 06/23/19 1151  BP: 97/76 98/61  Pulse: 79 79  Resp: 17 19  Temp:    SpO2: 100% 100%    Last Pain:  Vitals:   06/23/19 1115  TempSrc:   PainSc: 0-No pain                 Jereline Ticer S

## 2019-06-23 NOTE — CV Procedure (Signed)
Cardioversion  Patient sedated by anesthesia with 200 J synchronized biphasic energy  With pads in the AP position, patient cardioverted to SR with 200 J synchronized biphasic energy    Procedure was without complications  12 lead EKG pending  Nevin Bloodgood RossMD

## 2019-06-23 NOTE — Discharge Instructions (Signed)

## 2019-06-23 NOTE — Interval H&P Note (Signed)
History and Physical Interval Note:  06/23/2019 10:17 AM  [name]  has presented today for surgery, with the diagnosis of A-FIB.  The various methods of treatment have been discussed with the patient and family. After consideration of risks, benefits and other options for treatment, the patient has consented to  Procedure(s): CARDIOVERSION (N/A) as a surgical intervention.  The patient's history has been reviewed, patient examined, no change in status, stable for surgery.  I have reviewed the patient's chart and labs.  Questions were answered to the patient's satisfaction.     Dorris Carnes

## 2019-06-23 NOTE — Anesthesia Procedure Notes (Signed)
Procedure Name: General with mask airway Date/Time: 06/23/2019 11:03 AM Performed by: Renato Shin, CRNA Pre-anesthesia Checklist: Patient identified, Emergency Drugs available, Suction available and Patient being monitored Patient Re-evaluated:Patient Re-evaluated prior to induction Oxygen Delivery Method: Ambu bag Preoxygenation: Pre-oxygenation with 100% oxygen Induction Type: IV induction Ventilation: Mask ventilation without difficulty Placement Confirmation: positive ETCO2 and breath sounds checked- equal and bilateral Dental Injury: Teeth and Oropharynx as per pre-operative assessment

## 2019-06-23 NOTE — Anesthesia Preprocedure Evaluation (Signed)
Anesthesia Evaluation  Patient identified by MRN, date of birth, ID band Patient awake    Reviewed: Allergy & Precautions, H&P , NPO status , Patient's Chart, lab work & pertinent test results  Airway Mallampati: II   Neck ROM: full    Dental   Pulmonary neg pulmonary ROS,    breath sounds clear to auscultation       Cardiovascular hypertension, +CHF  + dysrhythmias Atrial Fibrillation  Rhythm:irregular Rate:Normal     Neuro/Psych Seizures -,   Neuromuscular disease    GI/Hepatic GERD  ,  Endo/Other    Renal/GU      Musculoskeletal  (+) Arthritis ,   Abdominal   Peds  Hematology   Anesthesia Other Findings   Reproductive/Obstetrics                             Anesthesia Physical Anesthesia Plan  ASA: III  Anesthesia Plan: General   Post-op Pain Management:    Induction: Intravenous  PONV Risk Score and Plan: 2 and Propofol infusion and Treatment may vary due to age or medical condition  Airway Management Planned: Mask  Additional Equipment:   Intra-op Plan:   Post-operative Plan:   Informed Consent: I have reviewed the patients History and Physical, chart, labs and discussed the procedure including the risks, benefits and alternatives for the proposed anesthesia with the patient or authorized representative who has indicated his/her understanding and acceptance.       Plan Discussed with: CRNA, Anesthesiologist and Surgeon  Anesthesia Plan Comments:         Anesthesia Quick Evaluation

## 2019-06-26 ENCOUNTER — Other Ambulatory Visit: Payer: Self-pay | Admitting: Cardiology

## 2019-06-27 ENCOUNTER — Other Ambulatory Visit (HOSPITAL_COMMUNITY): Payer: BC Managed Care – PPO

## 2019-06-28 ENCOUNTER — Telehealth (INDEPENDENT_AMBULATORY_CARE_PROVIDER_SITE_OTHER): Payer: BC Managed Care – PPO | Admitting: Cardiovascular Disease

## 2019-06-28 ENCOUNTER — Telehealth: Payer: Self-pay

## 2019-06-28 ENCOUNTER — Other Ambulatory Visit: Payer: Self-pay

## 2019-06-28 DIAGNOSIS — I4892 Unspecified atrial flutter: Secondary | ICD-10-CM | POA: Diagnosis not present

## 2019-06-28 DIAGNOSIS — I5023 Acute on chronic systolic (congestive) heart failure: Secondary | ICD-10-CM | POA: Diagnosis not present

## 2019-06-28 DIAGNOSIS — I340 Nonrheumatic mitral (valve) insufficiency: Secondary | ICD-10-CM

## 2019-06-28 DIAGNOSIS — I4819 Other persistent atrial fibrillation: Secondary | ICD-10-CM

## 2019-06-28 DIAGNOSIS — Z9889 Other specified postprocedural states: Secondary | ICD-10-CM

## 2019-06-28 DIAGNOSIS — I341 Nonrheumatic mitral (valve) prolapse: Secondary | ICD-10-CM

## 2019-06-28 DIAGNOSIS — I420 Dilated cardiomyopathy: Secondary | ICD-10-CM

## 2019-06-28 DIAGNOSIS — I10 Essential (primary) hypertension: Secondary | ICD-10-CM | POA: Diagnosis not present

## 2019-06-28 DIAGNOSIS — Z8679 Personal history of other diseases of the circulatory system: Secondary | ICD-10-CM

## 2019-06-28 NOTE — Patient Instructions (Signed)
Medication Instructions:  Your physician recommends that you continue on your current medications as directed. Please refer to the Current Medication list given to you today.  If you need a refill on your cardiac medications before your next appointment, please call your pharmacy.   Lab work: none If you have labs (blood work) drawn today and your tests are completely normal, you will receive your results only by: Marland Kitchen MyChart Message (if you have MyChart) OR . A paper copy in the mail If you have any lab test that is abnormal or we need to change your treatment, we will call you to review the results.  Testing/Procedures: none  Follow-Up: At [facility], you and your health needs are our priority.  As part of our continuing mission to provide you with exceptional heart care, we have created designated Provider Care Teams.  These Care Teams include your primary Cardiologist (physician) and Advanced Practice Providers (APPs -  Physician Assistants and Nurse Practitioners) who all work together to provide you with the care you need, when you need it. You will need a follow up appointment in 3 months with Dr. Quay Burow.  Please call our office 2 months in advance to schedule this appointment.

## 2019-06-28 NOTE — Telephone Encounter (Signed)
Patient and/or DPR-approved person aware of 8/26 AVS instructions and verbalized understanding. Pt aware AVS to be released to [employer]

## 2019-06-28 NOTE — Progress Notes (Signed)
Virtual Visit via Video Note   This visit type was conducted due to national recommendations for restrictions regarding the COVID-19 Pandemic (e.g. social distancing) in an effort to limit this patient's exposure and mitigate transmission in our community.  Due to his co-morbid illnesses, this patient is at least at moderate risk for complications without adequate follow up.  This format is felt to be most appropriate for this patient at this time.  All issues noted in this document were discussed and addressed.  A limited physical exam was performed with this format.  Please refer to the patient's chart for his consent to telehealth for [facility].   Date:  06/28/2019   ID:  [name], DOB 1956/07/31, MRN [id number]  Patient Location: Other:  His place of work Provider Location: Home  PCP:  [name], MD  Cardiologist:  Quay Burow, MD  Electrophysiologist:  None   Evaluation Performed:  Follow-Up Visit  Chief Complaint: Follow-up mitral valve repair/atrial fibrillation  History of Present Illness:    [name] is a 63 y.o. 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 He was referred by Dr. Coletta Memos for cardiovascular evaluation because of hypertension and mitral regurgitation.I last saw him in the office  03/14/2019.  His only cardiac risk factor is treated hypertension. He does not smoke. He's never had a heart attack or stroke. He is on 3 antihypertensive medications and is aware of some restriction. His last 2-D echocardiogram performed 11/25/12 revealed mildly dilated left ventricle with normal systolic function, moderate to severe MR and severe left atrial enlargement. He remains in sinus rhythm and otherwise is asymptomatic.   When I saw him in the office last he was in A. fib with RVR  He does complain of some increasing shortness of breath as well.  He appeared to be volume overloaded,  and and heart failure.  I arrange for him to be admitted to the hospital where he was diuresed and underwent TEE guided DC cardioversion back to sinus rhythm.  He is on Eliquis.  He was seen by Dr. Roxy Manns who agreed that he needed mitral valve repair.  His EF was 35 to 40% with severe MR and a flail P2 segment of the posterior leaflet with a ruptured chordae.  He is lost from 287 down to 241 pounds.  He is in sinus rhythm clinically and is improved.  He ultimately underwent cardiac catheterization by Dr. Burt Knack 03/16/2019 revealing normal coronary arteries.  5 days later on 03/21/2019 he underwent minimally invasive mitral valve repair along with a surgical Maze procedure and left atrial clipping.  He was discharged home on 03/28/2019 on Coumadin anticoagulation.  He was seen by Kerin Ransom in the office 05/29/2019 and was found to be in atrial fibrillation.  He was on amiodarone.  He underwent outpatient cardioversion successfully on 06/22/2019 by Dr. Dorris Carnes.  A 2D echo performed 05/26/2019 revealed improvement in his EF to normal with no evidence of mitral regurgitation.  His major complaints are feeling weak.  He denies chest pain or shortness of breath.   The patient does not have symptoms concerning for COVID-19 infection (fever, chills, cough, or new shortness of breath).    Past Medical History:  Diagnosis Date  . Acute on chronic systolic (congestive) heart failure ([place]) 12/30/2018  . Allergy   . Arthritis   . Atrial flutter with rapid ventricular response ([place])   . Chronic  systolic (congestive) heart failure ([place])   . Dilated aortic root ([place]) 01/04/2019  . Dilated cardiomyopathy ([place]) 01/04/2019  . GERD (gastroesophageal reflux disease)    history of  . Heart murmur   . History of colon polyps   . Hypertension   . Insomnia   . Mitral valve prolapse   . Mitral valve regurgitation   . Persistent atrial fibrillation 12/02/2018  . S/P Maze operation for atrial fibrillation 03/21/2019    Complete bilateral atrial lesion set using cryothermy and bipolar radiofrequency ablation with clipping of LA appendage via right mini thoracotomy approach  . S/P minimally invasive mitral valve repair 03/21/2019   Complex valvuloplasty including triangular resection of posterior leaflet, artificial Gore-tex neochord placement x4 and 36 mm Sorin Memo 4D ring annuloplasty via right mini thoracotomy approach  . Seizures ([place])    had one approx. 30 yrs ago,has not had any since  . Severe mitral regurgitation    Past Surgical History:  Procedure Laterality Date  . ANKLE FUSION  left   Dec. 2012  . ARTHROSCOPY KNEE W/ DRILLING  bilateral   2012  . CARDIAC VALVE REPLACEMENT    . CARDIOVERSION N/A 01/03/2019   Procedure: CARDIOVERSION;  Surgeon: Sueanne Margarita, MD;  Location: [facility] ENDOSCOPY;  Service: Cardiovascular;  Laterality: N/A;  . CARDIOVERSION N/A 06/23/2019   Procedure: CARDIOVERSION;  Surgeon: Fay Records, MD;  Location: [place];  Service: Cardiovascular;  Laterality: N/A;  . CLIPPING OF ATRIAL APPENDAGE  03/21/2019   Procedure: Clipping Of Atrial Appendage using 24mm Atricure Pro2 Clip;  Surgeon: Rexene Alberts, MD;  Location: [place];  Service: Open Heart Surgery;;  . COLONOSCOPY     x2  . FOOT ARTHRODESIS  06/23/2012   Procedure: ARTHRODESIS FOOT;  Surgeon: Wylene Simmer, MD;  Location: [place];  Service: Orthopedics;  Laterality: Left;  Left Subtalar and Talonavicular Joint Revision Arthrodesis  Aspiration of Bone Marrow from Left Hip   . HAIR TRANSPLANT    . HARDWARE REMOVAL  06/23/2012   Procedure: HARDWARE REMOVAL;  Surgeon: Wylene Simmer, MD;  Location: [place];  Service: Orthopedics;  Laterality: Left;  Removal of Deep Implant  X's 3  . INSERTION OF MESH N/A 07/10/2016   Procedure: INSERTION OF MESH;  Surgeon: Coralie Keens, MD;  Location: [place];  Service: General;  Laterality: N/A;  . JOINT REPLACEMENT     right hip  01-2011  . LEFT HEART CATH AND CORONARY  ANGIOGRAPHY N/A 03/16/2019   Procedure: LEFT HEART CATH AND CORONARY ANGIOGRAPHY;  Surgeon: Sherren Mocha, MD;  Location: [place] CV LAB;  Service: Cardiovascular;  Laterality: N/A;  . LIMB SPARING RESECTION HIP W/ SADDLE JOINT REPLACEMENT Right   . MINIMALLY INVASIVE MAZE PROCEDURE N/A 03/21/2019   Procedure: MINIMALLY INVASIVE MAZE PROCEDURE;  Surgeon: Rexene Alberts, MD;  Location: [place];  Service: Open Heart Surgery;  Laterality: N/A;  . MITRAL VALVE REPAIR Right 03/21/2019   Procedure: MINIMALLY INVASIVE MITRAL VALVE REPAIR (MVR) using Memo 4D ring size 36;  Surgeon: Rexene Alberts, MD;  Location: [place];  Service: Open Heart Surgery;  Laterality: Right;  . TEE WITHOUT CARDIOVERSION N/A 01/03/2019   Procedure: TRANSESOPHAGEAL ECHOCARDIOGRAM (TEE);  Surgeon: Sueanne Margarita, MD;  Location: [facility] ENDOSCOPY;  Service: Cardiovascular;  Laterality: N/A;  . TEE WITHOUT CARDIOVERSION N/A 03/21/2019   Procedure: TRANSESOPHAGEAL ECHOCARDIOGRAM (TEE);  Surgeon: Rexene Alberts, MD;  Location: [place];  Service: Open Heart Surgery;  Laterality: N/A;  .  TEMPORARY PACEMAKER N/A 03/22/2019   Procedure: TEMPORARY PACEMAKER;  Surgeon: Belva Crome, MD;  Location: [place] CV LAB;  Service: Cardiovascular;  Laterality: N/A;  . TOTAL KNEE ARTHROPLASTY Right 01/19/2014   Procedure: RIGHT TOTAL KNEE ARTHROPLASTY, Steroid injection left knee;  Surgeon: Mcarthur Rossetti, MD;  Location: [facility];  Service: Orthopedics;  Laterality: Right;  . TOTAL KNEE ARTHROPLASTY Left 10/23/2014   Procedure: LEFT TOTAL KNEE ARTHROPLASTY;  Surgeon: Mcarthur Rossetti, MD;  Location: [facility];  Service: Orthopedics;  Laterality: Left;  . UMBILICAL HERNIA REPAIR N/A 07/10/2016   Procedure: UMBILICAL HERNIA REPAIR;  Surgeon: Coralie Keens, MD;  Location: [place];  Service: General;  Laterality: N/A;     Current Meds  Medication Sig  . amiodarone (PACERONE) 200 MG tablet Take 1 tablet (200 mg total) by  mouth daily.  Marland Kitchen aspirin EC 81 MG EC tablet Take 1 tablet (81 mg total) by mouth daily.  Marland Kitchen diltiazem 2 % GEL Apply 1 application topically 3 (three) times daily. With 5 % lidocaine  . docusate sodium (COLACE) 100 MG capsule Take 300 mg by mouth daily.  . metoprolol tartrate (LOPRESSOR) 25 MG tablet Take 0.5 tablets (12.5 mg total) by mouth 2 (two) times daily.  . Multiple Vitamin (MULTIVITAMIN WITH MINERALS) TABS tablet Take 1 tablet by mouth daily.  . potassium chloride (K-DUR) 10 MEQ tablet Take 1 tablet by mouth twice daily  . sacubitril-valsartan (ENTRESTO) 24-26 MG Take 1 tablet by mouth 2 (two) times daily.  Marland Kitchen spironolactone (ALDACTONE) 25 MG tablet Take 1 tablet (25 mg total) by mouth daily.  Marland Kitchen torsemide (DEMADEX) 20 MG tablet Take 1 tablet (20 mg total) by mouth daily.  Marland Kitchen warfarin (COUMADIN) 5 MG tablet Take 1 tablet (5 mg total) by mouth daily. As directed by coumadin clinic     Allergies:   Patient has no known allergies.   Social History   Tobacco Use  . Smoking status: Never Smoker  . Smokeless tobacco: Never Used  Substance Use Topics  . Alcohol use: Yes    Comment: 1 or 2 drink daily  . Drug use: No     Family Hx: The patient's family history includes Colon cancer in his father; Diabetes in his mother; Hypertension in his father. There is no history of Stomach cancer, Esophageal cancer, Pancreatic cancer, or Rectal cancer.  ROS:   Please see the history of present illness.     All other systems reviewed and are negative.   Prior CV studies:   The following studies were reviewed today:  2D echocardiogram performed 05/26/2019  Labs/Other Tests and Data Reviewed:    EKG:  No ECG reviewed.  Recent Labs: 12/30/2018: B Natriuretic Peptide 1,365.8 03/15/2019: TSH 4.420 03/16/2019: ALT 32 06/19/2019: BUN 35; Creatinine, Ser 1.26; Hemoglobin 14.9; Magnesium 2.1; Platelets 182; Potassium 4.4; Sodium 141   Recent Lipid Panel No results found for: CHOL, TRIG, HDL,  CHOLHDL, LDLCALC, LDLDIRECT  Wt Readings from Last 3 Encounters:  06/28/19 245 lb (111.1 kg)  06/23/19 243 lb (110.2 kg)  05/29/19 249 lb (112.9 kg)     Objective:    Vital Signs:  BP 137/90   Pulse 82   Ht 6\' 3"  (1.905 m)   Wt 245 lb (111.1 kg)   BMI 30.62 kg/m    VITAL SIGNS:  reviewed GEN:  no acute distress RESPIRATORY:  normal respiratory effort, symmetric expansion MUSCULOSKELETAL:  no obvious deformities. NEURO:  alert and oriented x 3, no  obvious focal deficit PSYCH:  normal affect  ASSESSMENT & PLAN:    1. Severe mitral regurgitation- history of severe MR status post minimally invasive mitral valve repair by Dr. Roxy Manns 03/21/2019 along with a left atrial Maze procedure and left atrial clipping.  He was in the hospital a total of 1 week and has been recuperating since that time.  A follow-up 2D echo performed 05/26/2019 revealed normal LV function with no evidence of mitral regurgitation. 2. Atrial fibrillation- the patient was found to be in atrial fibrillation when he saw Kerin Ransom, PA-C, back in the office on 05/29/2019.  He was on amiodarone and Coumadin.  He underwent successful outpatient DC cardioversion by Dr. Harrington Challenger on 06/22/2019.  COVID-19 Education: The signs and symptoms of COVID-19 were discussed with the patient and how to seek care for testing (follow up with PCP or arrange E-visit).  The importance of social distancing was discussed today.  Time:   Today, I have spent 6 minutes with the patient with telehealth technology discussing the above problems.     Medication Adjustments/Labs and Tests Ordered: Current medicines are reviewed at length with the patient today.  Concerns regarding medicines are outlined above.   Tests Ordered: No orders of the defined types were placed in this encounter.   Medication Changes: No orders of the defined types were placed in this encounter.   Follow Up:  In Person in 3 month(s)  Signed, Quay Burow, MD   06/28/2019 12:14 PM    [place]

## 2019-07-03 ENCOUNTER — Other Ambulatory Visit: Payer: Self-pay

## 2019-07-03 ENCOUNTER — Ambulatory Visit: Payer: BC Managed Care – PPO | Admitting: Thoracic Surgery (Cardiothoracic Vascular Surgery)

## 2019-07-03 ENCOUNTER — Encounter: Payer: Self-pay | Admitting: Thoracic Surgery (Cardiothoracic Vascular Surgery)

## 2019-07-03 VITALS — BP 126/77 | HR 88 | Temp 97.7°F | Resp 20 | Ht 75.0 in | Wt 250.0 lb

## 2019-07-03 DIAGNOSIS — Z9889 Other specified postprocedural states: Secondary | ICD-10-CM

## 2019-07-03 DIAGNOSIS — Z8679 Personal history of other diseases of the circulatory system: Secondary | ICD-10-CM | POA: Diagnosis not present

## 2019-07-03 NOTE — Progress Notes (Signed)
[address]       [place] [address]             [phone]     CARDIOTHORACIC SURGERY OFFICE NOTE  Primary Cardiologist is Quay Burow, MD PCP is [name], MD   HPI:  Patient is a 64 year old male with mitral valve prolapse and severe mitral regurgitation, persistent atrial fibrillation and atrial flutter, chronic combined systolic and diastolic congestive heart failure, hypertension, and degenerative arthritis who returns to the office today for routine follow-up status post minimally invasive mitral valve repair and Maze procedure on Mar 21, 2019.  During the patient's early postoperative recovery he developed recurrent atrial fibrillation for which he was treated with amiodarone.  He was last seen here in our office on May 22, 2019 at which time he was doing fairly well although he remained in persistent atrial fibrillation.  Routine follow-up echocardiogram performed May 26, 2019 revealed normal left ventricular systolic function with intact mitral valve repair and no residual mitral regurgitation.  Mean transvalvular gradient across the mitral valve was estimated 5 mmHg.  He underwent DC cardioversion on June 23, 2019.  He returns to our office today for routine follow-up.  He reports that overall he is doing very well.  His only complaint is that his strength in stamina has been somewhat slow to recover.  He admits that he has not been walking or exercising at all for the last 6 or 8 weeks.  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  He did play golf this past weekend.  He reports no shortness of breath either with activity or at rest.  He has not had any soreness in his chest.  He has not had any palpitations or dizzy spells.  He remains anticoagulated using warfarin and he has not had any bleeding complications.  Overall he is pleased with his progress   Current Outpatient Medications  Medication Sig Dispense Refill  . amiodarone  (PACERONE) 200 MG tablet Take 1 tablet (200 mg total) by mouth daily. 30 tablet 1  . aspirin EC 81 MG EC tablet Take 1 tablet (81 mg total) by mouth daily.    Marland Kitchen diltiazem 2 % GEL Apply 1 application topically 3 (three) times daily. With 5 % lidocaine 30 g 1  . docusate sodium (COLACE) 100 MG capsule Take 300 mg by mouth daily.    . metoprolol tartrate (LOPRESSOR) 25 MG tablet Take 0.5 tablets (12.5 mg total) by mouth 2 (two) times daily. 30 tablet 1  . Multiple Vitamin (MULTIVITAMIN WITH MINERALS) TABS tablet Take 1 tablet by mouth daily.    . potassium chloride (K-DUR) 10 MEQ tablet Take 1 tablet by mouth twice daily 60 tablet 0  . sacubitril-valsartan (ENTRESTO) 24-26 MG Take 1 tablet by mouth 2 (two) times daily. 60 tablet 6  . spironolactone (ALDACTONE) 25 MG tablet Take 1 tablet (25 mg total) by mouth daily. 30 tablet 6  . torsemide (DEMADEX) 20 MG tablet Take 1 tablet (20 mg total) by mouth daily. 30 tablet 6  . warfarin (COUMADIN) 5 MG tablet Take 1 tablet (5 mg total) by mouth daily. As directed by coumadin clinic 100 tablet 1   No current facility-administered medications for this visit.       Physical Exam:   BP 126/77   Pulse 88   Temp 97.7 F (36.5 C) (Skin)   Resp 20   Ht 6\' 3"  (1.905 m)  Wt 250 lb (113.4 kg)   SpO2 96% Comment: RA  BMI 31.25 kg/m   General:  Well-appearing  Chest:   Clear to auscultation  CV:   Regular rate and rhythm with occasional ectopic beat, no murmur appreciated  Incisions:  Well-healed  Abdomen:  Soft nontender  Extremities:  Warm and well-perfused  Diagnostic Tests:  2 channel telemetry rhythm strip demonstrates normal sinus rhythm with occasional PVC     ECHOCARDIOGRAM REPORT       Patient Name:   [name] Date of Exam: 05/26/2019 Medical Rec #:  [id number]      Height:       75.0 in Accession #:    [id number]     Weight:       248.0 lb Date of Birth:  26-Aug-1956      BSA:          2.40 m Patient Age:    70 years        BP:           137/82 mmHg Patient Gender: M              HR:           102 bpm. Exam Location:  [facility]    Procedure: 2D Echo, Cardiac Doppler and Color Doppler  Indications:     Z98.890 Status post Mitral Valve Repair   History:         Patient has prior history of Echocardiogram examinations, most                  recent 12/09/2018. Status post Mitral valve repair 36 mm Sorin                  Memo 4D ring annuloplasty and Maze procedure. Risk Factors:                  Hypertension. Persistent Atrial Fibrillation/Atrial Flutter.                  Chronic combined systolic and diastolic heart failure.   Sonographer:     Wilford Sports Rodgers-Jones RDCS Referring Phys:  [place] Diagnosing Phys: Fransico Him MD  IMPRESSIONS    1. The left ventricle has normal systolic function, with an ejection fraction of 55-60%. The cavity size was mildly dilated. Left ventricular diastolic function could not be evaluated secondary to atrial fibrillation. Elevated left ventricular  end-diastolic pressure.  2. The right ventricle has normal systolic function. The cavity was mildly enlarged. There is no increase in right ventricular wall thickness. Right ventricular systolic pressure could not be assessed.  3. S/P Mitral valve repair. Moderate thickening of the mitral valve leaflet and calcification and thickening of chordae tendinae. Mild mitral valve stenosis. MV Mean grad: 5.0 mmHg. MV Area (PHT): 1.54 cm. There is no MR by colorflow doppler.  4. The aorta is abnormal in size and structure.  5. There is mild dilatation of the aortic root and of the ascending aorta measuring 39 mm and 21mm respectively.  6. The aortic valve is tricuspid. Mild sclerosis of the aortic valve. Aortic valve regurgitation was not assessed by color flow Doppler.  7. Left atrial size was mildly dilated.  8. Compared to prior echo, there is no MR and MV has been repaired.  FINDINGS  Left Ventricle: The left  ventricle has normal systolic function, with an ejection fraction of 55-60%. The cavity size was mildly dilated. There is no  increase in left ventricular wall thickness. Left ventricular diastolic function could not be evaluated  secondary to atrial fibrillation. Elevated left ventricular end-diastolic pressure  Right Ventricle: The right ventricle has normal systolic function. The cavity was mildly enlarged. There is no increase in right ventricular wall thickness. Right ventricular systolic pressure could not be assessed.  Left Atrium: Left atrial size was mildly dilated.  Right Atrium: Right atrial size was normal in size.  Interatrial Septum: No atrial level shunt detected by color flow Doppler.  Pericardium: There is no evidence of pericardial effusion.  Mitral Valve: The mitral valve has been repaired/replaced. Moderate thickening of the mitral valve leaflet. There is severe mitral annular calcification present.. Mild mitral valve stenosis.  Tricuspid Valve: The tricuspid valve is normal in structure. Tricuspid valve regurgitation was not visualized by color flow Doppler.  Aortic Valve: The aortic valve is tricuspid Mild sclerosis of the aortic valve. Aortic valve regurgitation was not assessed by color flow Doppler.  Pulmonic Valve: The pulmonic valve was normal in structure. Pulmonic valve regurgitation is trivial by color flow Doppler.  Aorta: The aorta is abnormal in size and structure. There is mild dilatation of the aortic root and of the ascending aorta measuring 39 mm.  Venous: The inferior vena cava is normal in size with greater than 50% respiratory variability.    +--------------+--------++ LEFT VENTRICLE         +----------------+----------++ +--------------+--------++ Diastology                 PLAX 2D                +----------------+----------++ +--------------+--------++ LV e' lateral:  10.33 cm/s LVIDd:        5.90 cm   +----------------+----------++ +--------------+--------++ LV E/e' lateral:15.7       LVIDs:        4.30 cm  +----------------+----------++ +--------------+--------++ LV e' medial:   9.10 cm/s  LV PW:        0.90 cm  +----------------+----------++ +--------------+--------++ LV E/e' medial: 17.8       LV IVS:       0.80 cm  +----------------+----------++ +--------------+--------++ LVOT diam:    2.30 cm  +--------------+--------++ LV SV:        90 ml    +--------------+--------++ LV SV Index:  36.57    +--------------+--------++ LVOT Area:    4.15 cm +--------------+--------++                        +--------------+--------++  +---------------+---------++ RIGHT VENTRICLE          +---------------+---------++ RV Basal diam: 4.00 cm   +---------------+---------++ RV S prime:    8.74 cm/s +---------------+---------++  +---------------+-------++-----------++ LEFT ATRIUM           Index       +---------------+-------++-----------++ LA diam:       5.10 cm2.12 cm/m  +---------------+-------++-----------++ LA Vol (A2C):  85.3 ml35.48 ml/m +---------------+-------++-----------++ LA Vol (A4C):  88.7 ml36.89 ml/m +---------------+-------++-----------++ LA Biplane Vol:88.7 ml36.89 ml/m +---------------+-------++-----------++ +------------+---------++-----------++ RIGHT ATRIUM         Index       +------------+---------++-----------++ RA Pressure:3.00 mmHg            +------------+---------++-----------++ RA Area:    19.30 cm            +------------+---------++-----------++ RA Volume:  54.60 ml 22.71 ml/m +------------+---------++-----------++  +------------+-----------++ AORTIC VALVE            +------------+-----------++ LVOT Vmax:  88.15 cm/s  +------------+-----------++ LVOT Vmean: 63.800 cm/s +------------+-----------++ LVOT VTI:   0.145 m      +------------+-----------++   +-------------+-------++ AORTA                +-------------+-------++ Ao Root diam:3.90 cm +-------------+-------++ Ao Asc diam: 3.80 cm +-------------+-------++  +--------------+-----------++ +---------------+---------++ MITRAL VALVE              TRICUSPID VALVE          +--------------+-----------++ +---------------+---------++ MV Area (PHT):1.54 cm    Estimated RAP: 3.00 mmHg +--------------+-----------++ +---------------+---------++ MV Peak grad: 11.4 mmHg   +--------------+-----------++ +--------------+-------+ MV Mean grad: 5.0 mmHg    SHUNTS                +--------------+-----------++ +--------------+-------+ MV Vmax:      1.69 m/s    Systemic VTI: 0.14 m  +--------------+-----------++ +--------------+-------+ MV Vmean:     102.0 cm/s  Systemic Diam:2.30 cm +--------------+-----------++ +--------------+-------+ MV VTI:       0.37 m      +--------------+-----------++ MV PHT:       143.00 msec +--------------+-----------++ +--------------+-----------++ MV E velocity:162.00 cm/s +--------------+-----------++    Fransico Him MD Electronically signed by Fransico Him MD Signature Date/Time: 05/26/2019/3:27:34 PM      Impression:  Patient is doing well approximately 3 months status post minimally invasive mitral valve repair and Maze procedure.  He is maintaining sinus rhythm since he underwent DC cardioversion.  I have personally reviewed the patient's transthoracic echocardiogram which demonstrates intact mitral valve repair and normal left ventricular function.    Plan:  We have not recommended any change to the patient's current medications.  At some point it might be reasonable to stop amiodarone, but I would wait for at least 6 to 8 weeks since his cardioversion was performed.  I have encouraged the patient to continue to gradually increase his physical activity  as tolerated without any particular limitations.  We have discussed the potential benefits of participation in cardiac rehab program, although the patient remains disinclined.  All of his questions have been addressed.  The patient will continue to follow-up regularly with Dr. Gwenlyn Found.  He will return to our office next May for routine follow-up and rhythm check.  I spent in excess of 15 minutes during the conduct of this office consultation and >50% of this time involved direct face-to-face encounter with the patient for counseling and/or coordination of their care.   Valentina Gu. Roxy Manns, MD 07/03/2019 1:54 PM

## 2019-07-03 NOTE — Patient Instructions (Signed)

## 2019-07-12 ENCOUNTER — Telehealth: Payer: BC Managed Care – PPO | Admitting: Cardiovascular Disease

## 2019-07-13 ENCOUNTER — Other Ambulatory Visit: Payer: Self-pay

## 2019-07-13 ENCOUNTER — Ambulatory Visit (INDEPENDENT_AMBULATORY_CARE_PROVIDER_SITE_OTHER): Payer: BC Managed Care – PPO | Admitting: Pharmacist

## 2019-07-13 DIAGNOSIS — Z5181 Encounter for therapeutic drug level monitoring: Secondary | ICD-10-CM

## 2019-07-13 DIAGNOSIS — I4819 Other persistent atrial fibrillation: Secondary | ICD-10-CM | POA: Diagnosis not present

## 2019-07-13 DIAGNOSIS — I4892 Unspecified atrial flutter: Secondary | ICD-10-CM

## 2019-07-13 LAB — POCT INR: INR: 3.9 — AB (ref 2.0–3.0)

## 2019-07-14 ENCOUNTER — Telehealth: Payer: Self-pay | Admitting: Gastroenterology

## 2019-07-14 MED ORDER — DILTIAZEM GEL 2 %: 1.0000 "application " | Freq: Three times a day (TID) | CUTANEOUS | 1 refills | Status: DC

## 2019-07-14 NOTE — Telephone Encounter (Signed)
Pt requested a refill for diltiazem.

## 2019-07-14 NOTE — Telephone Encounter (Signed)
Prescription of diltiazem gel faxed to patient's pharmacy.

## 2019-07-24 ENCOUNTER — Other Ambulatory Visit: Payer: Self-pay | Admitting: Cardiovascular Disease

## 2019-07-26 ENCOUNTER — Other Ambulatory Visit: Payer: Self-pay | Admitting: Cardiology

## 2019-08-07 ENCOUNTER — Other Ambulatory Visit: Payer: Self-pay | Admitting: Cardiology

## 2019-08-10 ENCOUNTER — Ambulatory Visit (INDEPENDENT_AMBULATORY_CARE_PROVIDER_SITE_OTHER): Payer: BC Managed Care – PPO | Admitting: Pharmacist

## 2019-08-10 ENCOUNTER — Other Ambulatory Visit: Payer: Self-pay

## 2019-08-10 DIAGNOSIS — I4819 Other persistent atrial fibrillation: Secondary | ICD-10-CM

## 2019-08-10 DIAGNOSIS — Z5181 Encounter for therapeutic drug level monitoring: Secondary | ICD-10-CM

## 2019-08-10 DIAGNOSIS — I4892 Unspecified atrial flutter: Secondary | ICD-10-CM

## 2019-08-10 LAB — POCT INR: INR: 2.2 (ref 2.0–3.0)

## 2019-08-12 ENCOUNTER — Emergency Department (HOSPITAL_COMMUNITY): Payer: BC Managed Care – PPO

## 2019-08-12 ENCOUNTER — Encounter (HOSPITAL_COMMUNITY): Payer: Self-pay

## 2019-08-12 ENCOUNTER — Other Ambulatory Visit: Payer: Self-pay

## 2019-08-12 ENCOUNTER — Emergency Department (HOSPITAL_COMMUNITY)
Admission: EM | Admit: 2019-08-12 | Discharge: 2019-08-12 | Disposition: A | Discharge status: Home / Self Care | Payer: BC Managed Care – PPO | Attending: Emergency Medicine | Admitting: Emergency Medicine

## 2019-08-12 DIAGNOSIS — Y92015 Private garage of single-family (private) house as the place of occurrence of the external cause: Secondary | ICD-10-CM | POA: Insufficient documentation

## 2019-08-12 DIAGNOSIS — Z96641 Presence of right artificial hip joint: Secondary | ICD-10-CM | POA: Diagnosis not present

## 2019-08-12 DIAGNOSIS — Z7901 Long term (current) use of anticoagulants: Secondary | ICD-10-CM | POA: Diagnosis not present

## 2019-08-12 DIAGNOSIS — S0003XA Contusion of scalp, initial encounter: Secondary | ICD-10-CM | POA: Diagnosis not present

## 2019-08-12 DIAGNOSIS — W19XXXA Unspecified fall, initial encounter: Secondary | ICD-10-CM

## 2019-08-12 DIAGNOSIS — R42 Dizziness and giddiness: Secondary | ICD-10-CM | POA: Diagnosis not present

## 2019-08-12 DIAGNOSIS — Z7982 Long term (current) use of aspirin: Secondary | ICD-10-CM | POA: Diagnosis not present

## 2019-08-12 DIAGNOSIS — Y999 Unspecified external cause status: Secondary | ICD-10-CM | POA: Diagnosis not present

## 2019-08-12 DIAGNOSIS — S0990XA Unspecified injury of head, initial encounter: Secondary | ICD-10-CM | POA: Diagnosis not present

## 2019-08-12 DIAGNOSIS — Y9389 Activity, other specified: Secondary | ICD-10-CM | POA: Diagnosis not present

## 2019-08-12 DIAGNOSIS — Z950 Presence of cardiac pacemaker: Secondary | ICD-10-CM | POA: Insufficient documentation

## 2019-08-12 DIAGNOSIS — Z96653 Presence of artificial knee joint, bilateral: Secondary | ICD-10-CM | POA: Diagnosis not present

## 2019-08-12 DIAGNOSIS — I110 Hypertensive heart disease with heart failure: Secondary | ICD-10-CM | POA: Insufficient documentation

## 2019-08-12 DIAGNOSIS — I5022 Chronic systolic (congestive) heart failure: Secondary | ICD-10-CM | POA: Diagnosis not present

## 2019-08-12 DIAGNOSIS — I10 Essential (primary) hypertension: Secondary | ICD-10-CM | POA: Diagnosis not present

## 2019-08-12 DIAGNOSIS — R52 Pain, unspecified: Secondary | ICD-10-CM | POA: Diagnosis not present

## 2019-08-12 DIAGNOSIS — W010XXA Fall on same level from slipping, tripping and stumbling without subsequent striking against object, initial encounter: Secondary | ICD-10-CM | POA: Diagnosis not present

## 2019-08-12 LAB — CBC WITH DIFFERENTIAL/PLATELET
Abs Immature Granulocytes: 0.03 10*3/uL (ref 0.00–0.07)
Basophils Absolute: 0 10*3/uL (ref 0.0–0.1)
Basophils Relative: 0 %
Eosinophils Absolute: 0.1 10*3/uL (ref 0.0–0.5)
Eosinophils Relative: 1 %
HCT: 48.8 % (ref 39.0–52.0)
Hemoglobin: 15.9 g/dL (ref 13.0–17.0)
Immature Granulocytes: 0 %
Lymphocytes Relative: 10 %
Lymphs Abs: 0.8 10*3/uL (ref 0.7–4.0)
MCH: 32.3 pg (ref 26.0–34.0)
MCHC: 32.6 g/dL (ref 30.0–36.0)
MCV: 99 fL (ref 80.0–100.0)
Monocytes Absolute: 0.5 10*3/uL (ref 0.1–1.0)
Monocytes Relative: 7 %
Neutro Abs: 6.6 10*3/uL (ref 1.7–7.7)
Neutrophils Relative %: 82 %
Platelets: 182 10*3/uL (ref 150–400)
RBC: 4.93 MIL/uL (ref 4.22–5.81)
RDW: 14 % (ref 11.5–15.5)
WBC: 8.1 10*3/uL (ref 4.0–10.5)
nRBC: 0 % (ref 0.0–0.2)

## 2019-08-12 LAB — BASIC METABOLIC PANEL
Anion gap: 12 (ref 5–15)
BUN: 28 mg/dL — ABNORMAL HIGH (ref 8–23)
CO2: 26 mmol/L (ref 22–32)
Calcium: 9.2 mg/dL (ref 8.9–10.3)
Chloride: 99 mmol/L (ref 98–111)
Creatinine, Ser: 1.12 mg/dL (ref 0.61–1.24)
GFR calc Af Amer: >60 mL/min (ref >60)
GFR calc non Af Amer: >60 mL/min (ref >60)
Glucose, Bld: 108 mg/dL — ABNORMAL HIGH (ref 70–99)
Potassium: 4.2 mmol/L (ref 3.5–5.1)
Sodium: 137 mmol/L (ref 135–145)

## 2019-08-12 LAB — PROTIME-INR
INR: 2.1 — ABNORMAL HIGH (ref 0.8–1.2)
Prothrombin Time: 22.9 seconds — ABNORMAL HIGH (ref 11.4–15.2)

## 2019-08-12 NOTE — ED Provider Notes (Signed)
[facility] EMERGENCY DEPARTMENT Provider Note   CSN: [id number] Arrival date & time: 08/12/19  1141     History   Chief Complaint Chief Complaint  Patient presents with  . Fall    HPI [name] is a 63 y.o. male.     Patient is a 63 year old male with past medical history of mitral valve repair, atrial fibrillation, Maze procedure, CHF, and dilated cardiomyopathy.  Patient on Coumadin for valve repair.  He presents today for evaluation of a fall in his garage.  The patient was attempting to get out of his garage before the door closed when he slipped on the wet concrete and struck the back of his head on the floor.  Patient denies loss of consciousness but felt extremely dizzy.  It took him approximately 45 minutes to crawl back into the house and he had to use a child's toy to knock the phone off the wall for him to call 911.  He describes pain to the back of his head and dizziness, both of which have improved significantly during EMS transport.  He denies any visual disturbances.  He denies any neck pain.  He denies any other injury in the fall.  He had an INR performed 2 days ago and was told it was 2.2.  The history is provided by the patient.  Fall This is a new problem. The current episode started 1 to 2 hours ago. The problem has been gradually improving. Nothing aggravates the symptoms. Nothing relieves the symptoms.    Past Medical History:  Diagnosis Date  . Acute on chronic systolic (congestive) heart failure ([place]) 12/30/2018  . Allergy   . Arthritis   . Atrial flutter with rapid ventricular response ([place])   . Chronic systolic (congestive) heart failure ([place])   . Dilated aortic root ([place]) 01/04/2019  . Dilated cardiomyopathy ([place]) 01/04/2019  . GERD (gastroesophageal reflux disease)    history of  . Heart murmur   . History of colon polyps   . Hypertension   . Insomnia   . Mitral valve prolapse   . Mitral valve regurgitation   . Persistent  atrial fibrillation ([place]) 12/02/2018  . S/P Maze operation for atrial fibrillation 03/21/2019   Complete bilateral atrial lesion set using cryothermy and bipolar radiofrequency ablation with clipping of LA appendage via right mini thoracotomy approach  . S/P minimally invasive mitral valve repair 03/21/2019   Complex valvuloplasty including triangular resection of posterior leaflet, artificial Gore-tex neochord placement x4 and 36 mm Sorin Memo 4D ring annuloplasty via right mini thoracotomy approach  . Seizures ([place])    had one approx. 30 yrs ago,has not had any since  . Severe mitral regurgitation     Patient Active Problem List   Diagnosis Date Noted  . Encounter for therapeutic drug monitoring 03/30/2019  . AV junctional bradycardia 03/22/2019  . S/P minimally invasive mitral valve repair  03/21/2019  . S/P Maze operation for atrial fibrillation 03/21/2019  . Dilated aortic root ([place]) 01/04/2019  . Dilated cardiomyopathy ([place]) 01/04/2019  . Atrial flutter with rapid ventricular response ([place])   . Mitral valve prolapse   . Hypertensive urgency 12/30/2018  . Acute on chronic systolic (congestive) heart failure ([place]) 12/30/2018  . Acute systolic heart failure ([place]) 12/30/2018  . Chronic systolic (congestive) heart failure ([place])   . Persistent atrial fibrillation ([place]) 12/02/2018  . Right hamstring muscle strain 07/20/2018  . Essential hypertension 02/07/2016  . Severe mitral regurgitation   .  Annual physical exam 11/16/2014  . Arthritis of left knee 10/23/2014  . Status post total left knee replacement 10/23/2014  . Arthritis of knee, right 01/19/2014  . Status post total knee replacement 01/19/2014  . Benign paroxysmal positional vertigo 10/30/2013  . Testicular hypofunction 03/24/2012    Past Surgical History:  Procedure Laterality Date  . ANKLE FUSION  left   Dec. 2012  . ARTHROSCOPY KNEE W/ DRILLING  bilateral   2012  . CARDIAC VALVE REPLACEMENT    . CARDIOVERSION N/A  01/03/2019   Procedure: CARDIOVERSION;  Surgeon: Sueanne Margarita, MD;  Location: [facility] ENDOSCOPY;  Service: Cardiovascular;  Laterality: N/A;  . CARDIOVERSION N/A 06/23/2019   Procedure: CARDIOVERSION;  Surgeon: Fay Records, MD;  Location: [place];  Service: Cardiovascular;  Laterality: N/A;  . CLIPPING OF ATRIAL APPENDAGE  03/21/2019   Procedure: Clipping Of Atrial Appendage using 50mm Atricure Pro2 Clip;  Surgeon: Rexene Alberts, MD;  Location: [place];  Service: Open Heart Surgery;;  . COLONOSCOPY     x2  . FOOT ARTHRODESIS  06/23/2012   Procedure: ARTHRODESIS FOOT;  Surgeon: Wylene Simmer, MD;  Location: [place];  Service: Orthopedics;  Laterality: Left;  Left Subtalar and Talonavicular Joint Revision Arthrodesis  Aspiration of Bone Marrow from Left Hip   . HAIR TRANSPLANT    . HARDWARE REMOVAL  06/23/2012   Procedure: HARDWARE REMOVAL;  Surgeon: Wylene Simmer, MD;  Location: [place];  Service: Orthopedics;  Laterality: Left;  Removal of Deep Implant  X's 3  . INSERTION OF MESH N/A 07/10/2016   Procedure: INSERTION OF MESH;  Surgeon: Coralie Keens, MD;  Location: [place];  Service: General;  Laterality: N/A;  . JOINT REPLACEMENT     right hip  01-2011  . LEFT HEART CATH AND CORONARY ANGIOGRAPHY N/A 03/16/2019   Procedure: LEFT HEART CATH AND CORONARY ANGIOGRAPHY;  Surgeon: Sherren Mocha, MD;  Location: [place] CV LAB;  Service: Cardiovascular;  Laterality: N/A;  . LIMB SPARING RESECTION HIP W/ SADDLE JOINT REPLACEMENT Right   . MINIMALLY INVASIVE MAZE PROCEDURE N/A 03/21/2019   Procedure: MINIMALLY INVASIVE MAZE PROCEDURE;  Surgeon: Rexene Alberts, MD;  Location: [place];  Service: Open Heart Surgery;  Laterality: N/A;  . MITRAL VALVE REPAIR Right 03/21/2019   Procedure: MINIMALLY INVASIVE MITRAL VALVE REPAIR (MVR) using Memo 4D ring size 36;  Surgeon: Rexene Alberts, MD;  Location: [place];  Service: Open Heart Surgery;  Laterality: Right;  . TEE WITHOUT CARDIOVERSION N/A  01/03/2019   Procedure: TRANSESOPHAGEAL ECHOCARDIOGRAM (TEE);  Surgeon: Sueanne Margarita, MD;  Location: [facility] ENDOSCOPY;  Service: Cardiovascular;  Laterality: N/A;  . TEE WITHOUT CARDIOVERSION N/A 03/21/2019   Procedure: TRANSESOPHAGEAL ECHOCARDIOGRAM (TEE);  Surgeon: Rexene Alberts, MD;  Location: [place];  Service: Open Heart Surgery;  Laterality: N/A;  . TEMPORARY PACEMAKER N/A 03/22/2019   Procedure: TEMPORARY PACEMAKER;  Surgeon: Belva Crome, MD;  Location: [place] CV LAB;  Service: Cardiovascular;  Laterality: N/A;  . TOTAL KNEE ARTHROPLASTY Right 01/19/2014   Procedure: RIGHT TOTAL KNEE ARTHROPLASTY, Steroid injection left knee;  Surgeon: Mcarthur Rossetti, MD;  Location: [facility];  Service: Orthopedics;  Laterality: Right;  . TOTAL KNEE ARTHROPLASTY Left 10/23/2014   Procedure: LEFT TOTAL KNEE ARTHROPLASTY;  Surgeon: Mcarthur Rossetti, MD;  [facility]: [facility];  Service: Orthopedics;  Laterality: Left;  . UMBILICAL HERNIA REPAIR N/A 07/10/2016   Procedure: UMBILICAL HERNIA REPAIR;  Surgeon: Coralie Keens, MD;  Location[facility];  Service: General;  Laterality: N/A;        Home Medications    Prior to Admission medications   Medication Sig Start Date End Date Taking? Authorizing Provider  amiodarone (PACERONE) 200 MG tablet Take 1 tablet (200 mg total) by mouth daily. 01/11/19   Erlene Quan, PA-C  aspirin EC 81 MG EC tablet Take 1 tablet (81 mg total) by mouth daily. 03/27/19   Gold, Wayne E, PA-C  diltiazem 2 % GEL Apply 1 application topically 3 (three) times daily. With 5 % lidocaine 07/14/19   Ladene Artist, MD  docusate sodium (COLACE) 100 MG capsule Take 300 mg by mouth daily.    [provider]  ENTRESTO 24-26 MG Take 1 tablet by mouth twice daily 08/07/19   Isaiah Serge, NP  metoprolol tartrate (LOPRESSOR) 25 MG tablet Take 1/2 (one-half) tablet by mouth twice daily 07/24/19   Lorretta Harp, MD  Multiple Vitamin (MULTIVITAMIN WITH  MINERALS) TABS tablet Take 1 tablet by mouth daily.    [provider]  potassium chloride (K-DUR) 10 MEQ tablet TAKE 1  BY MOUTH TWICE DAILY 07/26/19   Erlene Quan, PA-C  spironolactone (ALDACTONE) 25 MG tablet Take 1 tablet by mouth once daily 08/07/19   Isaiah Serge, NP  torsemide [facility]) 20 MG tablet Take 1 tablet by mouth once daily 08/07/19   Isaiah Serge, NP  warfarin (COUMADIN) 5 MG tablet Take 1 tablet (5 mg total) by mouth daily. As directed by coumadin clinic 03/27/19   John Giovanni, PA-C    Family History Family History  Problem Relation Age of Onset  . Hypertension Father   . Colon cancer Father        dx in his late 3's  . Diabetes Mother   . Stomach cancer Neg Hx   . Esophageal cancer Neg Hx   . Pancreatic cancer Neg Hx   . Rectal cancer Neg Hx     Social History Social History   Tobacco Use  . Smoking status: Never Smoker  . Smokeless tobacco: Never Used  Substance Use Topics  . Alcohol use: Yes    Comment: 1 or 2 drink daily  . Drug use: No     Allergies   Patient has no known allergies.   Review of Systems Review of Systems  All other systems reviewed and are negative.    Physical Exam Updated Vital Signs BP (!) 136/93   Pulse 71   Temp 97.8 F (36.6 C) (Oral)   Resp 16   Ht 6\' 3"  (1.905 m)   Wt 111.1 kg   SpO2 100%   BMI 30.62 kg/m   Physical Exam Vitals signs and nursing note reviewed.  Constitutional:      General: He is not in acute distress.    Appearance: He is well-developed. He is not diaphoretic.  HENT:     Head: Normocephalic.     Comments: There is swelling and contusion to the upper occiput, but no palpable defect.  There is an abrasion, but no laceration. Eyes:     Extraocular Movements: Extraocular movements intact.     Pupils: Pupils are equal, round, and reactive to light.  Neck:     Musculoskeletal: Normal range of motion and neck supple.     Comments: There is no cervical spine tenderness or  step-off.  He has painless range of motion in all directions. Cardiovascular:     Rate and  Rhythm: Normal rate and regular rhythm.     Heart sounds: No murmur. No friction rub.  Pulmonary:     Effort: Pulmonary effort is normal. No respiratory distress.     Breath sounds: Normal breath sounds. No wheezing or rales.  Abdominal:     General: Bowel sounds are normal. There is no distension.     Palpations: Abdomen is soft.     Tenderness: There is no abdominal tenderness.  Musculoskeletal: Normal range of motion.  Skin:    General: Skin is warm and dry.  Neurological:     General: No focal deficit present.     Mental Status: He is alert and oriented to person, place, and time. Mental status is at baseline.     Cranial Nerves: No cranial nerve deficit.     Sensory: No sensory deficit.     Motor: No weakness.     Coordination: Coordination normal.      ED Treatments / Results  Labs (all labs ordered are listed, but only abnormal results are displayed) Labs Reviewed  BASIC METABOLIC PANEL  CBC WITH DIFFERENTIAL/PLATELET  PROTIME-INR    EKG None  Radiology No results found.  Procedures Procedures (including critical care time)  Medications Ordered in ED Medications - No data to display   Initial Impression / Assessment and Plan / ED Course  I have reviewed the triage vital signs and the nursing notes.  Pertinent labs & imaging results that were available during my care of the patient were reviewed by me and considered in my medical decision making (see chart for details).  Patient presenting here with complaints of a head injury from a fall in the garage.  Patient was immediately dizzy and remained dizzy for a couple of hours.  He is feeling significantly improved right now and really would like to go home.    INR is 2.1 and head CT negative.  Patient will be with family for the next two days.  Wife present at bedside and comfortable with observing him at home.  He was  ambulated in the ED and is feeling better.  He will be discharged, to return as needed if he worsens.  Final Clinical Impressions(s) / ED Diagnoses   Final diagnoses:  None    ED Discharge Orders    None       Veryl Speak, MD 08/12/19 1353

## 2019-08-12 NOTE — ED Triage Notes (Addendum)
Pt arrived via [facility] from home. Pt slipped on a wet floor and fell back and hit posterior of head. Pt is on coumadin. Pt denies LOC. Pt was laying on ground for 2 hrs. Pt has some dried blood on back of head. Pt is A&Ox4. GCS 15. VS WNL. Pt c/o dizziness w/movement.

## 2019-08-12 NOTE — Discharge Instructions (Addendum)
Continue medications as previously prescribed.  Return to the emergency department for worsening headache, difficulty waking, seizure activity, or any other new and concerning symptoms.

## 2019-09-13 ENCOUNTER — Ambulatory Visit (INDEPENDENT_AMBULATORY_CARE_PROVIDER_SITE_OTHER): Payer: BC Managed Care – PPO | Admitting: Pharmacist Clinician (PhC)/ Clinical Pharmacy Specialist

## 2019-09-13 ENCOUNTER — Other Ambulatory Visit: Payer: Self-pay

## 2019-09-13 DIAGNOSIS — Z5181 Encounter for therapeutic drug level monitoring: Secondary | ICD-10-CM | POA: Diagnosis not present

## 2019-09-13 DIAGNOSIS — I4892 Unspecified atrial flutter: Secondary | ICD-10-CM | POA: Diagnosis not present

## 2019-09-13 DIAGNOSIS — I4819 Other persistent atrial fibrillation: Secondary | ICD-10-CM | POA: Diagnosis not present

## 2019-09-13 LAB — POCT INR: INR: 2.6 (ref 2.0–3.0)

## 2019-10-03 ENCOUNTER — Telehealth: Payer: Self-pay | Admitting: Gastroenterology

## 2019-10-03 MED ORDER — DILTIAZEM GEL 2 %: 1.0000 "application " | Freq: Three times a day (TID) | CUTANEOUS | 1 refills | Status: DC

## 2019-10-03 NOTE — Telephone Encounter (Signed)
Informed patient I will send a refill to his pharmacy but he needs to come in to the office and be rechecked. Patient states he will call and make an appt after the holidays if he is still having recurrent rectal pain and bleeding.

## 2019-10-05 ENCOUNTER — Other Ambulatory Visit: Payer: Self-pay | Admitting: Cardiology

## 2019-10-20 DIAGNOSIS — M25572 Pain in left ankle and joints of left foot: Secondary | ICD-10-CM | POA: Diagnosis not present

## 2019-10-23 ENCOUNTER — Other Ambulatory Visit: Payer: Self-pay | Admitting: Orthopedic Surgery

## 2019-10-23 DIAGNOSIS — M25572 Pain in left ankle and joints of left foot: Secondary | ICD-10-CM

## 2019-10-26 ENCOUNTER — Other Ambulatory Visit: Payer: Self-pay

## 2019-10-26 ENCOUNTER — Ambulatory Visit (INDEPENDENT_AMBULATORY_CARE_PROVIDER_SITE_OTHER): Payer: BC Managed Care – PPO | Admitting: Pharmacist Clinician (PhC)/ Clinical Pharmacy Specialist

## 2019-10-26 DIAGNOSIS — I4892 Unspecified atrial flutter: Secondary | ICD-10-CM | POA: Diagnosis not present

## 2019-10-26 DIAGNOSIS — Z5181 Encounter for therapeutic drug level monitoring: Secondary | ICD-10-CM

## 2019-10-26 DIAGNOSIS — I4819 Other persistent atrial fibrillation: Secondary | ICD-10-CM

## 2019-10-26 LAB — POCT INR: INR: 1.8 — AB (ref 2.0–3.0)

## 2019-10-30 ENCOUNTER — Ambulatory Visit
Admission: RE | Admit: 2019-10-30 | Discharge: 2019-10-30 | Disposition: A | Discharge status: Home / Self Care | Payer: BC Managed Care – PPO | Source: Ambulatory Visit | Attending: Orthopedic Surgery | Admitting: Orthopedic Surgery

## 2019-10-30 DIAGNOSIS — R600 Localized edema: Secondary | ICD-10-CM | POA: Diagnosis not present

## 2019-10-30 DIAGNOSIS — M25572 Pain in left ankle and joints of left foot: Secondary | ICD-10-CM

## 2019-11-20 DIAGNOSIS — I10 Essential (primary) hypertension: Secondary | ICD-10-CM | POA: Diagnosis not present

## 2019-11-20 DIAGNOSIS — Z13220 Encounter for screening for lipoid disorders: Secondary | ICD-10-CM | POA: Diagnosis not present

## 2019-11-20 DIAGNOSIS — H8110 Benign paroxysmal vertigo, unspecified ear: Secondary | ICD-10-CM | POA: Diagnosis not present

## 2019-11-20 DIAGNOSIS — Z0000 Encounter for general adult medical examination without abnormal findings: Secondary | ICD-10-CM | POA: Diagnosis not present

## 2019-11-20 DIAGNOSIS — R635 Abnormal weight gain: Secondary | ICD-10-CM | POA: Diagnosis not present

## 2019-12-06 ENCOUNTER — Ambulatory Visit (INDEPENDENT_AMBULATORY_CARE_PROVIDER_SITE_OTHER): Payer: BC Managed Care – PPO | Admitting: Pharmacist Clinician (PhC)/ Clinical Pharmacy Specialist

## 2019-12-06 ENCOUNTER — Other Ambulatory Visit: Payer: Self-pay

## 2019-12-06 DIAGNOSIS — I4892 Unspecified atrial flutter: Secondary | ICD-10-CM | POA: Diagnosis not present

## 2019-12-06 DIAGNOSIS — I4819 Other persistent atrial fibrillation: Secondary | ICD-10-CM

## 2019-12-06 DIAGNOSIS — Z5181 Encounter for therapeutic drug level monitoring: Secondary | ICD-10-CM | POA: Diagnosis not present

## 2019-12-06 LAB — POCT INR: INR: 2.7 (ref 2.0–3.0)

## 2020-01-01 ENCOUNTER — Ambulatory Visit: Payer: Self-pay

## 2020-01-01 ENCOUNTER — Other Ambulatory Visit: Payer: Self-pay

## 2020-01-01 ENCOUNTER — Ambulatory Visit: Payer: Medicare HMO | Admitting: Orthopaedic Surgery

## 2020-01-01 ENCOUNTER — Encounter: Payer: Self-pay | Admitting: Orthopaedic Surgery

## 2020-01-01 DIAGNOSIS — M7062 Trochanteric bursitis, left hip: Secondary | ICD-10-CM | POA: Diagnosis not present

## 2020-01-01 DIAGNOSIS — M25551 Pain in right hip: Secondary | ICD-10-CM

## 2020-01-01 DIAGNOSIS — M25552 Pain in left hip: Secondary | ICD-10-CM

## 2020-01-01 MED ORDER — LIDOCAINE HCL 1 % IJ SOLN
3.0000 mL | INTRAMUSCULAR | Status: AC | PRN
  Administered 2020-01-01: 3 mL

## 2020-01-01 MED ORDER — METHYLPREDNISOLONE ACETATE 40 MG/ML IJ SUSP
40.0000 mg | INTRAMUSCULAR | Status: AC | PRN
  Administered 2020-01-01: 40 mg via INTRA_ARTICULAR

## 2020-01-01 NOTE — Progress Notes (Signed)
Office Visit Note   Patient: [name]           Date of Birth: 1956-07-15           MRN: [id number] Visit Date: 01/01/2020              Requested by: [name], MD [place],  [place] [id number] PCP: [name], MD   Assessment & Plan: Visit Diagnoses:  1. Pain in left hip   2. Pain in right hip     Plan: Follow up as needed or no improvement.    Follow-Up Instructions: Return if symptoms worsen or fail to improve.   Orders:  Orders Placed This Encounter  Procedures  . Large Joint Inj: L greater trochanter  . XR HIP UNILAT W OR W/O PELVIS 1V LEFT   No orders of the defined types were placed in this encounter.     Procedures: Large Joint Inj: L greater trochanter on 01/01/2020 3:35 PM Indications: pain Details: 22 G 1.5 in needle, lateral approach  Arthrogram: No  Medications: 3 mL lidocaine 1 %; 40 mg methylPREDNISolone acetate 40 MG/ML Outcome: tolerated well, no immediate complications Procedure, treatment alternatives, risks and benefits explained, specific risks discussed. Consent was given by the patient. Immediately prior to procedure a time out was called to verify the correct patient, procedure, equipment, support staff and site/side marked as required. Patient was prepped and draped in the usual sterile fashion.       Clinical Data: No additional findings.   Subjective: Chief Complaint  Patient presents with  . Left Hip - Pain    HPI  Review of Systems   Objective: Vital Signs: There were no vitals taken for this visit.  Physical Exam Constitutional:      Appearance: He is not ill-appearing or diaphoretic.  Pulmonary:     Effort: Pulmonary effort is normal.  Neurological:     Mental Status: He is alert and oriented to person, place, and time.  Psychiatric:        Mood and Affect: Mood normal.     Ortho Exam Good range of motion bilateral hips without pain. Tenderness over left hip greater  trochanter.   Specialty Comments:  No specialty comments available.  Imaging: XR HIP UNILAT W OR W/O PELVIS 1V LEFT  Result Date: 01/01/2020 AP pelvis and Lateral left hip: No acute fracture. Right total hip components well seated. Left hip joint well maintained. Periarticular spurring.     PMFS History: Patient Active Problem List   Diagnosis Date Noted  . Encounter for therapeutic drug monitoring 03/30/2019  . AV junctional bradycardia 03/22/2019  . S/P minimally invasive mitral valve repair  03/21/2019  . S/P Maze operation for atrial fibrillation 03/21/2019  . Dilated aortic root ([place]) 01/04/2019  . Dilated cardiomyopathy ([place]) 01/04/2019  . Atrial flutter with rapid ventricular response ([place])   . Mitral valve prolapse   . Hypertensive urgency 12/30/2018  . Acute on chronic systolic (congestive) heart failure ([place]) 12/30/2018  . Acute systolic heart failure ([place]) 12/30/2018  . Chronic systolic (congestive) heart failure ([place])   . Persistent atrial fibrillation ([place]) 12/02/2018  . Right hamstring muscle strain 07/20/2018  . Essential hypertension 02/07/2016  . Severe mitral regurgitation   . Annual physical exam 11/16/2014  . Arthritis of left knee 10/23/2014  . Status post total left knee replacement 10/23/2014  . Arthritis of knee, right 01/19/2014  . Status post total knee replacement 01/19/2014  .  Benign paroxysmal positional vertigo 10/30/2013  . Testicular hypofunction 03/24/2012   Past Medical History:  Diagnosis Date  . Acute on chronic systolic (congestive) heart failure ([place]) 12/30/2018  . Allergy   . Arthritis   . Atrial flutter with rapid ventricular response ([place])   . Chronic systolic (congestive) heart failure ([place])   . Dilated aortic root ([place]) 01/04/2019  . Dilated cardiomyopathy ([place]) 01/04/2019  . GERD (gastroesophageal reflux disease)    history of  . Heart murmur   . History of colon polyps   . Hypertension   . Insomnia   . Mitral valve prolapse    . Mitral valve regurgitation   . Persistent atrial fibrillation ([place]) 12/02/2018  . S/P Maze operation for atrial fibrillation 03/21/2019   Complete bilateral atrial lesion set using cryothermy and bipolar radiofrequency ablation with clipping of LA appendage via right mini thoracotomy approach  . S/P minimally invasive mitral valve repair 03/21/2019   Complex valvuloplasty including triangular resection of posterior leaflet, artificial Gore-tex neochord placement x4 and 36 mm Sorin Memo 4D ring annuloplasty via right mini thoracotomy approach  . Seizures ([place])    had one approx. 30 yrs ago,has not had any since  . Severe mitral regurgitation     Family History  Problem Relation Age of Onset  . Hypertension Father   . Colon cancer Father        dx in his late 50's  . Diabetes Mother   . Stomach cancer Neg Hx   . Esophageal cancer Neg Hx   . Pancreatic cancer Neg Hx   . Rectal cancer Neg Hx     Past Surgical History:  Procedure Laterality Date  . ANKLE FUSION  left   Dec. 2012  . ARTHROSCOPY KNEE W/ DRILLING  bilateral   2012  . CARDIAC VALVE REPLACEMENT    . CARDIOVERSION N/A 01/03/2019   Procedure: CARDIOVERSION;  Surgeon: Sueanne Margarita, MD;  Location: [facility] ENDOSCOPY;  Service: Cardiovascular;  Laterality: N/A;  . CARDIOVERSION N/A 06/23/2019   Procedure: CARDIOVERSION;  Surgeon: Fay Records, MD;  Location: [place];  Service: Cardiovascular;  Laterality: N/A;  . CLIPPING OF ATRIAL APPENDAGE  03/21/2019   Procedure: Clipping Of Atrial Appendage using 60mm Atricure Pro2 Clip;  Surgeon: Rexene Alberts, MD;  Location: [place];  Service: Open Heart Surgery;;  . COLONOSCOPY     x2  . FOOT ARTHRODESIS  06/23/2012   Procedure: ARTHRODESIS FOOT;  Surgeon: Wylene Simmer, MD;  Location: [place];  Service: Orthopedics;  Laterality: Left;  Left Subtalar and Talonavicular Joint Revision Arthrodesis  Aspiration of Bone Marrow from Left Hip   . HAIR TRANSPLANT    . HARDWARE REMOVAL  06/23/2012     Procedure: HARDWARE REMOVAL;  Surgeon: Wylene Simmer, MD;  Location: [place];  Service: Orthopedics;  Laterality: Left;  Removal of Deep Implant  X's 3  . INSERTION OF MESH N/A 07/10/2016   Procedure: INSERTION OF MESH;  Surgeon: Coralie Keens, MD;  Location: [place];  Service: General;  Laterality: N/A;  . JOINT REPLACEMENT     right hip  01-2011  . LEFT HEART CATH AND CORONARY ANGIOGRAPHY N/A 03/16/2019   Procedure: LEFT HEART CATH AND CORONARY ANGIOGRAPHY;  Surgeon: Sherren Mocha, MD;  Location: [place] CV LAB;  Service: Cardiovascular;  Laterality: N/A;  . LIMB SPARING RESECTION HIP W/ SADDLE JOINT REPLACEMENT Right   . MINIMALLY INVASIVE MAZE PROCEDURE N/A 03/21/2019   Procedure: MINIMALLY INVASIVE MAZE PROCEDURE;  Surgeon: Rexene Alberts, MD;  Location: [place];  Service: Open Heart Surgery;  Laterality: N/A;  . MITRAL VALVE REPAIR Right 03/21/2019   Procedure: MINIMALLY INVASIVE MITRAL VALVE REPAIR (MVR) using Memo 4D ring size 36;  Surgeon: Rexene Alberts, MD;  Location: [place];  Service: Open Heart Surgery;  Laterality: Right;  . TEE WITHOUT CARDIOVERSION N/A 01/03/2019   Procedure: TRANSESOPHAGEAL ECHOCARDIOGRAM (TEE);  Surgeon: Sueanne Margarita, MD;  Location: [facility] ENDOSCOPY;  Service: Cardiovascular;  Laterality: N/A;  . TEE WITHOUT CARDIOVERSION N/A 03/21/2019   Procedure: TRANSESOPHAGEAL ECHOCARDIOGRAM (TEE);  Surgeon: Rexene Alberts, MD;  Location: [place];  Service: Open Heart Surgery;  Laterality: N/A;  . TEMPORARY PACEMAKER N/A 03/22/2019   Procedure: TEMPORARY PACEMAKER;  Surgeon: Belva Crome, MD;  Location: [place] [facility];  Service: Cardiovascular;  Laterality: N/A;  . TOTAL KNEE ARTHROPLASTY Right 01/19/2014   Procedure: RIGHT TOTAL KNEE ARTHROPLASTY, Steroid injection left knee;  Surgeon: Mcarthur Rossetti, MD;  [facility];  Service: Orthopedics;  Laterality: Right;  . TOTAL KNEE ARTHROPLASTY Left 10/23/2014   Procedure: LEFT TOTAL KNEE  ARTHROPLASTY;  Surgeon: Mcarthur Rossetti, MD;  [facility];  Service: Orthopedics;  Laterality: Left;  . UMBILICAL HERNIA REPAIR N/A 07/10/2016   Procedure: UMBILICAL HERNIA REPAIR;  Surgeon: Coralie Keens, MD;  Location: [place];  Service: General;  Laterality: N/A;   Social History   Occupational History  . Occupation: Pension scheme manager  Tobacco Use  . Smoking status: Never Smoker  . Smokeless tobacco: Never Used  Substance and Sexual Activity  . Alcohol use: Yes    Comment: 1 or 2 drink daily  . Drug use: No  . Sexual activity: Yes

## 2020-01-07 ENCOUNTER — Ambulatory Visit: Payer: Medicare HMO | Attending: Internal Medicine

## 2020-01-07 DIAGNOSIS — Z23 Encounter for immunization: Secondary | ICD-10-CM

## 2020-01-07 NOTE — Progress Notes (Signed)
   Covid-19 Vaccination Clinic  Name:  [name]    MRN: [id number] DOB: April 05, 1956  01/07/2020  Mr. [name] was observed post Covid-19 immunization for 15 minutes without incident. He was provided with Vaccine Information Sheet and instruction to access the V-Safe system.   Mr. [name] was instructed to call 911 with any severe reactions post vaccine: Marland Kitchen Difficulty breathing  . Swelling of face and throat  . A fast heartbeat  . A bad rash all over body  . Dizziness and weakness

## 2020-01-17 ENCOUNTER — Other Ambulatory Visit: Payer: Self-pay

## 2020-01-17 ENCOUNTER — Ambulatory Visit (INDEPENDENT_AMBULATORY_CARE_PROVIDER_SITE_OTHER): Payer: Medicare HMO | Admitting: Pharmacist Clinician (PhC)/ Clinical Pharmacy Specialist

## 2020-01-17 DIAGNOSIS — Z5181 Encounter for therapeutic drug level monitoring: Secondary | ICD-10-CM | POA: Diagnosis not present

## 2020-01-17 DIAGNOSIS — I4819 Other persistent atrial fibrillation: Secondary | ICD-10-CM

## 2020-01-17 DIAGNOSIS — I4892 Unspecified atrial flutter: Secondary | ICD-10-CM

## 2020-01-17 LAB — POCT INR: INR: 3.5 — AB (ref 2.0–3.0)

## 2020-01-30 ENCOUNTER — Other Ambulatory Visit: Payer: Self-pay | Admitting: Cardiovascular Disease

## 2020-02-01 ENCOUNTER — Telehealth: Payer: Self-pay | Admitting: *Deleted

## 2020-02-01 NOTE — Telephone Encounter (Signed)
Pt takes warfarin for afib with CHADS2VASc score of 3 (age, CHF, HTN). Do not need 7 day hold for warfarin. Recommend that pt hold warfarin for 5 days prior to procedure.

## 2020-02-01 NOTE — Telephone Encounter (Signed)
Pharm please address coumadin 

## 2020-02-01 NOTE — Telephone Encounter (Signed)
Pt needs follow up with Dr. Adora Fridge, was supposed to have been seen in November.   Thanks.

## 2020-02-01 NOTE — Telephone Encounter (Signed)
I s/w the pt who is agreeable to appt for pre op clearance appt. I have pt seeing Coletta Memos, FNP 4/7 @ 10:45. Pt is grateful for the call and the help. I will remove from the pre op call back pool.

## 2020-02-01 NOTE — Telephone Encounter (Signed)
   [facility] Pre-operative Risk Assessment    Request for surgical clearance:  1. What type of surgery is being performed? L5-S1 INTERLAMINAR EPIDURAL STEROID INJECTION  2. When is this surgery scheduled? TBD   3. What type of clearance is required (medical clearance vs. Pharmacy clearance to hold med vs. Both)? PHARM  4. Are there any medications that need to be held prior to surgery and how long? WARFARIN X 7 DAY TO INJECTION   5. Practice name and name of physician performing surgery? [facility]; DR. DAVID O'TOOLE   6. What is your office phone number [phone]    7.   What is your office fax number [phone]  8.   Anesthesia type (None, local, MAC, general) ? NOT LISTED   Julaine Hua 02/01/2020, 12:19 PM  _________________________________________________________________   (provider comments below)

## 2020-02-05 ENCOUNTER — Other Ambulatory Visit: Payer: Self-pay | Admitting: Cardiovascular Disease

## 2020-02-06 ENCOUNTER — Ambulatory Visit: Payer: Medicare HMO | Attending: Internal Medicine

## 2020-02-06 DIAGNOSIS — Z23 Encounter for immunization: Secondary | ICD-10-CM

## 2020-02-06 NOTE — Progress Notes (Signed)
   Covid-19 Vaccination Clinic  Name:  GRADY STROHSCHEIN    MRN: [id number] DOB: Jul 07, 1956  02/06/2020  Mr. Sutherland was observed post Covid-19 immunization for 15 minutes without incident. He was provided with Vaccine Information Sheet and instruction to access the V-Safe system.   Mr. Schnider was instructed to call 911 with any severe reactions post vaccine: Marland Kitchen Difficulty breathing  . Swelling of face and throat  . A fast heartbeat  . A bad rash all over body  . Dizziness and weakness   Immunizations Administered    Name Date Dose VIS Date Route   Pfizer COVID-19 Vaccine 02/06/2020  3:16 PM 0.3 mL 10/13/2019 Intramuscular   Manufacturer: [place]   Lot: [id number]   [place]: [id number]

## 2020-02-06 NOTE — Telephone Encounter (Signed)
This patient has an appt tomorrow and needs refill of Entresto. I did not want to send it in yet just in case any changes need to be made.

## 2020-02-06 NOTE — Progress Notes (Addendum)
Cardiology Clinic Note   Patient Name: [name] Date of Encounter: 02/07/2020  Primary Care Provider:  [name], MD Primary Cardiologist:  Quay Burow, MD  Patient Profile    [name] 64 year old male presents today for follow-up evaluation of his essential hypertension, severe mitral regurgitation, chronic systolic heart failure, persistent atrial fibrillation, and preop cardiac evaluation.  Past Medical History    Past Medical History:  Diagnosis Date  . Acute on chronic systolic (congestive) heart failure ([place]) 12/30/2018  . Allergy   . Arthritis   . Atrial flutter with rapid ventricular response ([place])   . Chronic systolic (congestive) heart failure ([place])   . Dilated aortic root ([place]) 01/04/2019  . Dilated cardiomyopathy ([place]) 01/04/2019  . GERD (gastroesophageal reflux disease)    history of  . Heart murmur   . History of colon polyps   . Hypertension   . Insomnia   . Mitral valve prolapse   . Mitral valve regurgitation   . Persistent atrial fibrillation ([place]) 12/02/2018  . S/P Maze operation for atrial fibrillation 03/21/2019   Complete bilateral atrial lesion set using cryothermy and bipolar radiofrequency ablation with clipping of LA appendage via right mini thoracotomy approach  . S/P minimally invasive mitral valve repair 03/21/2019   Complex valvuloplasty including triangular resection of posterior leaflet, artificial Gore-tex neochord placement x4 and 36 mm Sorin Memo 4D ring annuloplasty via right mini thoracotomy approach  . Seizures ([place])    had one approx. 30 yrs ago,has not had any since  . Severe mitral regurgitation    Past Surgical History:  Procedure Laterality Date  . ANKLE FUSION  left   Dec. 2012  . ARTHROSCOPY KNEE W/ DRILLING  bilateral   2012  . CARDIAC VALVE REPLACEMENT    . CARDIOVERSION N/A 01/03/2019   Procedure: CARDIOVERSION;  Surgeon: Sueanne Margarita, MD;  Location: [facility] ENDOSCOPY;  Service: Cardiovascular;  Laterality: N/A;   . CARDIOVERSION N/A 06/23/2019   Procedure: CARDIOVERSION;  Surgeon: Fay Records, MD;  Location: [place];  Service: Cardiovascular;  Laterality: N/A;  . CLIPPING OF ATRIAL APPENDAGE  03/21/2019   Procedure: Clipping Of Atrial Appendage using 37mm Atricure Pro2 Clip;  Surgeon: Rexene Alberts, MD;  Location: [place];  Service: Open Heart Surgery;;  . COLONOSCOPY     x2  . FOOT ARTHRODESIS  06/23/2012   Procedure: ARTHRODESIS FOOT;  Surgeon: Wylene Simmer, MD;  Location: [place];  Service: Orthopedics;  Laterality: Left;  Left Subtalar and Talonavicular Joint Revision Arthrodesis  Aspiration of Bone Marrow from Left Hip   . HAIR TRANSPLANT    . HARDWARE REMOVAL  06/23/2012   Procedure: HARDWARE REMOVAL;  Surgeon: Wylene Simmer, MD;  Location: [place];  Service: Orthopedics;  Laterality: Left;  Removal of Deep Implant  X's 3  . INSERTION OF MESH N/A 07/10/2016   Procedure: INSERTION OF MESH;  Surgeon: Coralie Keens, MD;  Location: [place];  Service: General;  Laterality: N/A;  . JOINT REPLACEMENT     right hip  01-2011  . LEFT HEART CATH AND CORONARY ANGIOGRAPHY N/A 03/16/2019   Procedure: LEFT HEART CATH AND CORONARY ANGIOGRAPHY;  Surgeon: Sherren Mocha, MD;  Location: [place] CV LAB;  Service: Cardiovascular;  Laterality: N/A;  . LIMB SPARING RESECTION HIP W/ SADDLE JOINT REPLACEMENT Right   . MINIMALLY INVASIVE MAZE PROCEDURE N/A 03/21/2019   Procedure: MINIMALLY INVASIVE MAZE PROCEDURE;  Surgeon: Rexene Alberts, MD;  Location: [place];  Service:  Open Heart Surgery;  Laterality: N/A;  . MITRAL VALVE REPAIR Right 03/21/2019   Procedure: MINIMALLY INVASIVE MITRAL VALVE REPAIR (MVR) using Memo 4D ring size 36;  Surgeon: Rexene Alberts, MD;  Location: [place];  Service: Open Heart Surgery;  Laterality: Right;  . TEE WITHOUT CARDIOVERSION N/A 01/03/2019   Procedure: TRANSESOPHAGEAL ECHOCARDIOGRAM (TEE);  Surgeon: Sueanne Margarita, MD;  Location: [facility] ENDOSCOPY;  Service: Cardiovascular;   Laterality: N/A;  . TEE WITHOUT CARDIOVERSION N/A 03/21/2019   Procedure: TRANSESOPHAGEAL ECHOCARDIOGRAM (TEE);  Surgeon: Rexene Alberts, MD;  Location: [place];  Service: Open Heart Surgery;  Laterality: N/A;  . TEMPORARY PACEMAKER N/A 03/22/2019   Procedure: TEMPORARY PACEMAKER;  Surgeon: Belva Crome, MD;  Location: [place] CV LAB;  Service: Cardiovascular;  Laterality: N/A;  . TOTAL KNEE ARTHROPLASTY Right 01/19/2014   Procedure: RIGHT TOTAL KNEE ARTHROPLASTY, Steroid injection left knee;  Surgeon: Mcarthur Rossetti, MD;  Location: [facility];  Service: Orthopedics;  Laterality: Right;  . TOTAL KNEE ARTHROPLASTY Left 10/23/2014   Procedure: LEFT TOTAL KNEE ARTHROPLASTY;  Surgeon: Mcarthur Rossetti, MD;  Location: [facility];  Service: Orthopedics;  Laterality: Left;  . UMBILICAL HERNIA REPAIR N/A 07/10/2016   Procedure: UMBILICAL HERNIA REPAIR;  Surgeon: Coralie Keens, MD;  Location: [place];  Service: General;  Laterality: N/A;    Allergies  No Known Allergies  History of Present Illness    [name] has a PMH of essential hypertension, severe mitral regurgitation, chronic systolic HF, mitral valve prolapse (s/p minimally invasive mitral valve repair), persistent A. fib (status post maze operation for atrial fibrillation), hypertensive urgency, dilated aortic root, dilated cardiomyopathy, AV junctional bradycardia.  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  He was referred by Dr. Coletta Memos for cardiac evaluation due to hypertension and mitral valve regurgitation.  His only cardiac risk factor is treated hypertension.  He has never had a heart attack or CVA.  He is prescribed 3 antihypertensive medications.  His echocardiogram 11/25/2012 showed mild dilated left ventricule with normal systolic function, moderate to severe MR and severe left atrial enlargement.  He was seen in the office and noted to be in A. fib with RVR which  was complicated by his increased shortness of breath.  He was fluid volume overloaded and in heart failure.  He was admitted to [facility], diuresed and underwent TEE, and DCCV.  This was successful in returning him to [facility].  He was placed on Eliquis.  He was seen by Dr. Roxy Manns and agreeable to mitral valve repair.  His EF was 35-40% with severe MR.  He was diuresed from 278 pounds to 241 pounds.  He underwent cardiac catheterization by Dr. Burt Knack 03/2019 which showed normal coronary arteries.  5 days later on 03/21/2019 he underwent minimally invasive mitral valve repair along with surgical Maze procedure and left atrial clipping.  He was discharged on 03/28/2019 on Coumadin for anticoagulation.  He was seen by Kerin Ransom, PA-C on 05/29/2019 and was found to be in atrial fibrillation.  He was placed on amiodarone.  He will underwent successful DCCV on 06/22/2019 by Dr. Harrington Challenger.  A 2D echo performed on 05/26/2019 showed improved EF to normal with no evidence of mitral regurgitation.  He was last seen by Dr. Gwenlyn Found on 06/28/2019.  During that time he was doing well.  He complained of feeling somewhat weak.  He denied chest pain and shortness of breath.  He presents the clinic today for follow-up evaluation and preoperative cardiac evaluation.  He states he feels well.  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  He is very involved in diagnostics of the vehicles.  He has been following a low-sodium diet.  He has had some weight gain however he is euvolemic today.  I will clear him for his upcoming spinal injection.  He will coordinate stopping his Coumadin with pharmacy and INR.  I will have him follow-up with Dr. Gwenlyn Found in 12 months and as needed.  Today he denies chest pain, shortness of breath, lower extremity edema, fatigue, palpitations, melena, hematuria, hemoptysis, diaphoresis, weakness, presyncope, syncope, orthopnea, and PND.    Home Medications    Prior to Admission medications   Medication Sig Start Date End  Date Taking? Authorizing Provider  amiodarone (PACERONE) 200 MG tablet Take 1 tablet (200 mg total) by mouth daily. 01/11/19   Erlene Quan, PA-C  aspirin EC 81 MG EC tablet Take 1 tablet (81 mg total) by mouth daily. 03/27/19   Gold, Wayne E, PA-C  diltiazem 2 % GEL Apply 1 application topically 3 (three) times daily. With 5 % lidocaine 10/03/19   Ladene Artist, MD  docusate sodium (COLACE) 100 MG capsule Take 300 mg by mouth daily.    [provider]  ENTRESTO 24-26 MG Take 1 tablet by mouth twice daily 10/05/19   Lorretta Harp, MD  metoprolol tartrate (LOPRESSOR) 25 MG tablet Take 1/2 (one-half) tablet by mouth twice daily 07/24/19   Lorretta Harp, MD  Multiple Vitamin (MULTIVITAMIN WITH MINERALS) TABS tablet Take 1 tablet by mouth daily.    [provider]  potassium chloride (K-DUR) 10 MEQ tablet TAKE 1  BY MOUTH TWICE DAILY 07/26/19   Erlene Quan, PA-C  spironolactone (ALDACTONE) 25 MG tablet Take 1 tablet by mouth once daily 01/31/20   Lorretta Harp, MD  torsemide [employer]) 20 MG tablet Take 1 tablet by mouth once daily 01/31/20   Lorretta Harp, MD  warfarin (COUMADIN) 5 MG tablet Take 1 tablet (5 mg total) by mouth daily. As directed by coumadin clinic 03/27/19   John Giovanni, PA-C    Family History    Family History  Problem Relation Age of Onset  . Hypertension Father   . Colon cancer Father        dx in his late 35's  . Diabetes Mother   . Stomach cancer Neg Hx   . Esophageal cancer Neg Hx   . Pancreatic cancer Neg Hx   . Rectal cancer Neg Hx    He indicated that his mother is alive. He indicated that his father is alive. He indicated that his sister is alive. He indicated that his brother is alive. He indicated that his maternal grandmother is deceased. He indicated that his maternal grandfather is deceased. He indicated that his paternal grandmother is deceased. He indicated that his paternal grandfather is deceased. He indicated that his  child is alive. He indicated that the status of his neg hx is unknown.  Social History    Social History   Socioeconomic History  . Marital status: Married    Spouse name: Not on file  . Number of children: 1  . Years of education: Not on file  . Highest education level: Not on file  Occupational History  . Occupation: Pension scheme manager  Tobacco Use  . Smoking status: Never Smoker  . Smokeless tobacco: Never Used  Substance and Sexual Activity  . Alcohol use: Yes    Comment: 1 or 2 drink daily  . Drug use: No  . Sexual activity: Yes  Other Topics Concern  . Not on file  Social History Narrative  . Not on file   Social Determinants of Health   Financial Resource Strain:   . Difficulty of Paying Living Expenses:   Food Insecurity:   . Worried About Charity fundraiser in the Last Year:   . Arboriculturist in the Last Year:   Transportation Needs:   . Film/video editor (Medical):   Marland Kitchen Lack of Transportation (Non-Medical):   Physical Activity:   . Days of Exercise per Week:   . Minutes of Exercise per Session:   Stress:   . Feeling of Stress :   Social Connections:   . Frequency of Communication with Friends and Family:   . Frequency of Social Gatherings with Friends and Family:   . Attends Religious Services:   . Active Member of Clubs or Organizations:   . Attends Archivist Meetings:   Marland Kitchen Marital Status:   Intimate Partner Violence:   . Fear of Current or Ex-Partner:   . Emotionally Abused:   Marland Kitchen Physically Abused:   . Sexually Abused:      Review of Systems    General:  No chills, fever, night sweats or weight changes.  Cardiovascular:  No chest pain, dyspnea on exertion, edema, orthopnea, palpitations, paroxysmal nocturnal dyspnea. Dermatological: No rash, lesions/masses Respiratory: No cough, dyspnea Urologic: No hematuria, dysuria Abdominal:   No nausea, vomiting, diarrhea, bright red blood per rectum, melena, or hematemesis Neurologic:  No  visual changes, wkns, changes in mental status. All other systems reviewed and are otherwise negative except as noted above.  Physical Exam    VS:  BP 124/74 (BP Location: Left Arm, Patient Position: Sitting, Cuff Size: Large)   Pulse 71   Ht 6\' 3"  (1.905 m)   Wt 257 lb 6.4 oz (116.8 kg)   SpO2 94%   BMI 32.17 kg/m  , BMI Body mass index is 32.17 kg/m. GEN: Well nourished, well developed, in no acute distress. HEENT: normal. Neck: Supple, no JVD, carotid bruits, or masses. Cardiac: RRR, no murmurs, rubs, or gallops. No clubbing, cyanosis, edema.  Radials/DP/PT 2+ and equal bilaterally.  Respiratory:  Respirations regular and unlabored, clear to auscultation bilaterally. GI: Soft, nontender, nondistended, BS + x 4. MS: no deformity or atrophy. Skin: warm and dry, no rash. Neuro:  Strength and sensation are intact. Psych: Normal affect.  Accessory Clinical Findings    ECG personally reviewed by me today-  normal sinus rhythm left axis deviation right bundle branch block 71 bpm - No acute changes  EKG 06/23/2019 Normal sinus rhythm with occasional PVCs left axis deviation right bundle branch block and 76 bpm  Echocardiogram 05/26/2019 IMPRESSIONS    1. The left ventricle has normal systolic function, with an ejection  fraction of 55-60%. The cavity size was mildly dilated. Left ventricular  diastolic function could not be evaluated secondary to atrial  fibrillation. Elevated left ventricular  end-diastolic pressure.  2. The right ventricle has normal systolic function. The cavity was  mildly enlarged. There is no increase in right ventricular wall thickness.  Right ventricular systolic pressure could not be assessed.  3. S/P Mitral valve repair. Moderate thickening of the mitral valve  leaflet and calcification and thickening of chordae tendinae. Mild mitral  valve stenosis. MV Mean  grad: 5.0 mmHg. MV Area (PHT): 1.54 cm. There is  no MR by colorflow doppler.  4. The  aorta is abnormal in size and structure.  5. There is mild dilatation of the aortic root and of the ascending aorta  measuring 39 mm and 47mm respectively.  6. The aortic valve is tricuspid. Mild sclerosis of the aortic valve.  Aortic valve regurgitation was not assessed by color flow Doppler.  7. Left atrial size was mildly dilated.  8. Compared to prior echo, there is no MR and MV has been repaired.   Assessment & Plan   1.  Atrial fibrillation-EKG today shows normal sinus rhythm left axis deviation right bundle branch block 71 bpm.  Underwent successful DCCV 06/22/2019. Continue amiodarone 200 mg daily Continue continue metoprolol tartrate 12.5 mg twice daily Continue torsemide 20 mg tablet daily Continue Coumadin 5 mg tablet daily as directed by Coumadin clinic Continue potassium 10 mEq daily Avoid triggers caffeine, chocolate, EtOH, etc. Heart healthy low-sodium diet Increase physical activity as tolerated  Chronic systolic heart failure-euvolemic today.  Echocardiogram 05/2019 showed normal LV function and no evidence of mitral regurgitation. Continue aspirin 81 mg tablet daily Continue Entresto 24-26 Continue metoprolol tartrate 12.5 mg twice daily Continue torsemide 20 mg tablet daily Continue potassium chloride 10 mill equivalent tablet daily Continue spironolactone 25 mg daily Heart healthy low-sodium diet-salty 6 given Daily weights Increase physical activity as tolerated  Severe mitral regurgitation-s/p minimally invasive mitral valve repair by Dr. Roxy Manns on 03/21/2019 along with left atrial Maze procedure and left atrial clipping.  Follow-up echocardiogram 05/2019 showed normal LV function and no evidence of mitral regurgitation   Preoperative cardiac evaluation     Primary Cardiologist: Quay Burow, MD  Chart reviewed as part of pre-operative protocol coverage. Given past medical history and time since last visit, based on ACC/AHA guidelines, [name]  would be at acceptable risk for the planned procedure without further cardiovascular testing.   His RCRI is a class I risk, 0.4% risk of major cardiac event.  She is able to complete greater than 4 METS of physical activity.  Pt takes warfarin for afib with CHADS2VASc score of 3 (age, CHF, HTN). Do not need 7 day hold for warfarin. Recommend that pt hold warfarin for 5 days prior to procedure.  Will need INR checked prior to injection.  Please schedule injection so that he may have pharmacy check/manage his INR.  I will route this recommendation to the requesting party via Epic fax function and remove from pre-op pool.   Please call with questions.  Disposition: Follow-up with Dr. Gwenlyn Found in 12 months.  Douglas Ng. [facility] [address] Office [phone] Fax [phone]

## 2020-02-07 ENCOUNTER — Ambulatory Visit: Payer: Medicare HMO | Admitting: General Practice

## 2020-02-07 ENCOUNTER — Encounter: Payer: Self-pay | Admitting: General Practice

## 2020-02-07 ENCOUNTER — Other Ambulatory Visit: Payer: Self-pay

## 2020-02-07 VITALS — BP 124/74 | HR 71 | Ht 75.0 in | Wt 257.4 lb

## 2020-02-07 DIAGNOSIS — I340 Nonrheumatic mitral (valve) insufficiency: Secondary | ICD-10-CM | POA: Diagnosis not present

## 2020-02-07 DIAGNOSIS — I4819 Other persistent atrial fibrillation: Secondary | ICD-10-CM | POA: Diagnosis not present

## 2020-02-07 DIAGNOSIS — I5022 Chronic systolic (congestive) heart failure: Secondary | ICD-10-CM

## 2020-02-07 DIAGNOSIS — Z01810 Encounter for preprocedural cardiovascular examination: Secondary | ICD-10-CM | POA: Diagnosis not present

## 2020-02-07 NOTE — Patient Instructions (Signed)
Medication Instructions:  The current medical regimen is effective;  continue present plan and medications as directed. Please refer to the Current Medication list given to you today. *If you need a refill on your cardiac medications before your next appointment, please call your pharmacy*  Special Instructions CLEARED FOR INJECTION  PLEASE CALL AFTER THIS IS SCHEDULED, WE WILL NEED 3-4 DAYS FOR THE COUMADIN  ADJUSTMENT  Follow-Up: Your next appointment:  12 month(s) Please call our office 2 months in advance to schedule this appointment In Person with You may see Quay Burow, MD or one of the following Advanced Practice Providers on your designated Care Team:  Coletta Memos, Rote, PA-C  Sande Rives, PA-C  At [facility], you and your health needs are our priority.  As part of our continuing mission to provide you with exceptional heart care, we have created designated Provider Care Teams.  These Care Teams include your primary Cardiologist (physician) and Advanced Practice Providers (APPs -  Physician Assistants and Nurse Practitioners) who all work together to provide you with the care you need, when you need it.

## 2020-02-14 ENCOUNTER — Other Ambulatory Visit: Payer: Self-pay

## 2020-02-14 ENCOUNTER — Ambulatory Visit (INDEPENDENT_AMBULATORY_CARE_PROVIDER_SITE_OTHER): Payer: Medicare HMO | Admitting: Pharmacist

## 2020-02-14 DIAGNOSIS — Z5181 Encounter for therapeutic drug level monitoring: Secondary | ICD-10-CM

## 2020-02-14 DIAGNOSIS — I4892 Unspecified atrial flutter: Secondary | ICD-10-CM

## 2020-02-14 DIAGNOSIS — I4819 Other persistent atrial fibrillation: Secondary | ICD-10-CM | POA: Diagnosis not present

## 2020-02-14 LAB — POCT INR: INR: 4 — AB (ref 2.0–3.0)

## 2020-02-14 NOTE — Patient Instructions (Addendum)
HOLD warfarin for 2 days , then resume 1 tablet (5mg ) daily. Recheck INR in 3 weeks.  Call clinic [phone]

## 2020-02-19 ENCOUNTER — Other Ambulatory Visit: Payer: Self-pay | Admitting: Cardiology

## 2020-02-22 ENCOUNTER — Other Ambulatory Visit: Payer: Self-pay

## 2020-02-22 NOTE — Telephone Encounter (Signed)
Made in error

## 2020-02-28 ENCOUNTER — Ambulatory Visit (INDEPENDENT_AMBULATORY_CARE_PROVIDER_SITE_OTHER): Payer: Medicare HMO | Admitting: Pharmacist Clinician (PhC)/ Clinical Pharmacy Specialist

## 2020-02-28 ENCOUNTER — Other Ambulatory Visit: Payer: Self-pay

## 2020-02-28 DIAGNOSIS — Z5181 Encounter for therapeutic drug level monitoring: Secondary | ICD-10-CM | POA: Diagnosis not present

## 2020-02-28 DIAGNOSIS — I4819 Other persistent atrial fibrillation: Secondary | ICD-10-CM

## 2020-02-28 DIAGNOSIS — I4892 Unspecified atrial flutter: Secondary | ICD-10-CM

## 2020-02-28 LAB — POCT INR: INR: 1.3 — AB (ref 2.0–3.0)

## 2020-03-04 ENCOUNTER — Other Ambulatory Visit: Payer: Self-pay | Admitting: Cardiovascular Disease

## 2020-03-22 ENCOUNTER — Other Ambulatory Visit: Payer: Self-pay

## 2020-03-22 ENCOUNTER — Ambulatory Visit (INDEPENDENT_AMBULATORY_CARE_PROVIDER_SITE_OTHER): Payer: Medicare HMO | Admitting: Pharmacist

## 2020-03-22 DIAGNOSIS — I4819 Other persistent atrial fibrillation: Secondary | ICD-10-CM | POA: Diagnosis not present

## 2020-03-22 DIAGNOSIS — I4892 Unspecified atrial flutter: Secondary | ICD-10-CM | POA: Diagnosis not present

## 2020-03-22 DIAGNOSIS — Z5181 Encounter for therapeutic drug level monitoring: Secondary | ICD-10-CM

## 2020-03-22 LAB — POCT INR: INR: 2.7 (ref 2.0–3.0)

## 2020-03-22 NOTE — Patient Instructions (Signed)
Description   Resume 1 tablet (5mg ) daily. Recheck INR in 6 weeks.  Call clinic [phone]

## 2020-03-25 ENCOUNTER — Encounter: Payer: Self-pay | Admitting: Thoracic Surgery (Cardiothoracic Vascular Surgery)

## 2020-03-25 ENCOUNTER — Other Ambulatory Visit: Payer: Self-pay

## 2020-03-25 ENCOUNTER — Telehealth (HOSPITAL_COMMUNITY): Payer: Self-pay | Admitting: Physician Assistant

## 2020-03-25 ENCOUNTER — Ambulatory Visit: Payer: Medicare HMO | Admitting: Thoracic Surgery (Cardiothoracic Vascular Surgery)

## 2020-03-25 VITALS — BP 145/90 | HR 72 | Temp 97.6°F | Resp 20 | Ht 75.0 in | Wt 253.0 lb

## 2020-03-25 DIAGNOSIS — I341 Nonrheumatic mitral (valve) prolapse: Secondary | ICD-10-CM | POA: Diagnosis not present

## 2020-03-25 DIAGNOSIS — I340 Nonrheumatic mitral (valve) insufficiency: Secondary | ICD-10-CM

## 2020-03-25 DIAGNOSIS — Z9889 Other specified postprocedural states: Secondary | ICD-10-CM | POA: Diagnosis not present

## 2020-03-25 DIAGNOSIS — Z8679 Personal history of other diseases of the circulatory system: Secondary | ICD-10-CM

## 2020-03-25 NOTE — Telephone Encounter (Signed)
Called and left message for patient to call back.  Pt needs one month f/u with [facility] per Dr. Roxy Manns.

## 2020-03-25 NOTE — Progress Notes (Signed)
[address]       [place] [address]             [phone]     CARDIOTHORACIC SURGERY OFFICE NOTE  Primary Cardiologist is Quay Burow, MD PCP is [name], MD   HPI:  Patient is a 64 year old male with mitral valve prolapse and severe mitral regurgitation,persistent atrial fibrillation and atrial flutter, chronic combined systolic and diastolic congestive heart failure,hypertension, and degenerative arthritis whoreturns to the office today for routine follow-up status post minimally invasive mitral valve repair and Maze procedure on Mar 21, 2019. During the patient's early postoperative recovery he developed recurrent atrial fibrillation for which he was treated with amiodarone.  He was last seen here in our office on May 22, 2019 at which time he was doing fairly well although he remained in persistent atrial fibrillation. Routine follow-up echocardiogram performed May 26, 2019 revealed normal left ventricular systolic function with intact mitral valve repair and no residual mitral regurgitation.  Mean transvalvular gradient across the mitral valve was estimated 5 mmHg.  He underwent DC cardioversion on June 23, 2019 and he has been maintaining sinus rhythm ever since.  He returns to our office today and reports that he is doing very well.  He has no symptoms of exertional shortness of breath.  He has problems with chronic back pain and pain in his ankle related to previous orthopedic injury.  He has no chest pain.  He has never had any palpitations or other symptoms to suggest a recurrence of atrial fibrillation.  He remains on anticoagulation using warfarin although he hopes to come off if possible.   Current Outpatient Medications  Medication Sig Dispense Refill  . amiodarone (PACERONE) 200 MG tablet Take 1 tablet (200 mg total) by mouth daily. 90 tablet 3  . aspirin EC 81 MG EC tablet Take 1 tablet (81 mg total) by mouth daily.    Marland Kitchen atorvastatin  (LIPITOR) 20 MG tablet Take 20 mg by mouth daily.    Marland Kitchen docusate sodium (COLACE) 100 MG capsule Take 300 mg by mouth daily.    Marland Kitchen ENTRESTO 24-26 MG Take 1 tablet by mouth twice daily 180 tablet 3  . metoprolol tartrate (LOPRESSOR) 25 MG tablet Take 1/2 (one-half) tablet by mouth twice daily 30 tablet 10  . Multiple Vitamin (MULTIVITAMIN WITH MINERALS) TABS tablet Take 1 tablet by mouth daily.    . potassium chloride (K-DUR) 10 MEQ tablet TAKE 1  BY MOUTH TWICE DAILY 180 tablet 2  . spironolactone (ALDACTONE) 25 MG tablet TAKE 1 TABLET BY MOUTH ONCE DAILY . APPOINTMENT REQUIRED FOR FUTURE REFILLS 90 tablet 3  . torsemide (DEMADEX) 20 MG tablet TAKE 1 TABLET BY MOUTH ONCE DAILY . APPOINTMENT REQUIRED FOR FUTURE REFILLS 90 tablet 3  . warfarin (COUMADIN) 5 MG tablet Take 1 tablet (5 mg total) by mouth daily. As directed by coumadin clinic 100 tablet 1   No current facility-administered medications for this visit.      Physical Exam:   BP (!) 145/90   Pulse 72   Temp 97.6 F (36.4 C) (Skin)   Resp 20   Ht 6\' 3"  (1.905 m)   Wt 253 lb (114.8 kg)   SpO2 97% Comment: RA  BMI 31.62 kg/m   General:  Well-appearing  Chest:   Clear to auscultation  CV:   Regular rate and rhythm without murmur  Incisions:  Completely healed  Abdomen:  Soft nontender  Extremities:  Warm and well-perfused  Diagnostic Tests:  2 channel telemetry rhythm strip demonstrates normal sinus rhythm   Impression:  Patient is doing very well and maintaining sinus rhythm approximately 1 year status post minimally invasive mitral valve repair and Maze procedure  Plan:  I do not feel that the patient should remain on amiodarone long-term and I have suggested that the patient go ahead and stop taking amiodarone at this time.  If he continues to maintain sinus rhythm it is possible that he could be taken off of long-term anticoagulation since his CHA2DS2-VASc score is only 2 based on age 32 and history of hypertension.   Alternatively he would prefer to switch to use of Eliquis or an alternative DOAC.  We will defer this decision making to Dr. Gwenlyn Found and/or the team at the atrial fibrillation clinic.  The patient has been reminded regarding the importance of dental hygiene and the lifelong need for antibiotic prophylaxis for all dental cleanings and other related invasive procedures.  The patient will continue to follow-up periodically with Dr. Gwenlyn Found and return to our office in the future only should further problems or questions arise.  I spent in excess of 15 minutes during the conduct of this office consultation and >50% of this time involved direct face-to-face encounter with the patient for counseling and/or coordination of their care.    Valentina Gu. Roxy Manns, MD 03/25/2020 12:27 PM

## 2020-03-25 NOTE — Patient Instructions (Addendum)
Stop taking amiodarone.  Notify the Coumadin clinic that you have been taken off of amiodarone.  Schedule follow up appointment with Dr Gwenlyn Found and/or Atrial Fibrillation Clinic to discuss whether or not you need to remain on long term anticoagulation with warfarin or any other blood thinners  Endocarditis is a potentially serious infection of heart valves or inside lining of the heart.  It occurs more commonly in patients with diseased heart valves (such as patient's with aortic or mitral valve disease) and in patients who have undergone heart valve repair or replacement.  Certain surgical and dental procedures may put you at risk, such as dental cleaning, other dental procedures, or any surgery involving the respiratory, urinary, gastrointestinal tract, gallbladder or prostate gland.   To minimize your chances for develooping endocarditis, maintain good oral health and seek prompt medical attention for any infections involving the mouth, teeth, gums, skin or urinary tract.    Always notify your doctor or dentist about your underlying heart valve condition before having any invasive procedures. You will need to take antibiotics before certain procedures, including all routine dental cleanings or other dental procedures.  Your cardiologist or dentist should prescribe these antibiotics for you to be taken ahead of time.

## 2020-03-27 NOTE — Telephone Encounter (Signed)
Called and left 2nd message requesting patient call back to schedule 1 month appt per Dr. Roxy Manns.  Left message on home and mobile vm's.

## 2020-03-27 NOTE — Telephone Encounter (Signed)
Patient had left a VM during lunch.  I called him back and he is aware of appt 04/24/20 @2 :30 with Adline Peals, PA.  Staff message sent to Jana Half @[facility]  to advise appt had been made.

## 2020-04-19 ENCOUNTER — Other Ambulatory Visit: Payer: Self-pay | Admitting: Surgical

## 2020-04-19 ENCOUNTER — Other Ambulatory Visit: Payer: Self-pay | Admitting: Pharmacist

## 2020-04-19 MED ORDER — WARFARIN SODIUM 5 MG PO TABS: 5.0000 mg | ORAL_TABLET | Freq: Every day | ORAL | 1 refills | Status: DC

## 2020-04-23 ENCOUNTER — Other Ambulatory Visit: Payer: Self-pay | Admitting: Cardiology

## 2020-04-24 ENCOUNTER — Other Ambulatory Visit: Payer: Self-pay

## 2020-04-24 ENCOUNTER — Ambulatory Visit (HOSPITAL_COMMUNITY)
Admission: RE | Admit: 2020-04-24 | Discharge: 2020-04-24 | Disposition: A | Discharge status: Home / Self Care | Payer: Medicare HMO | Source: Ambulatory Visit | Attending: Physician Assistant | Admitting: Physician Assistant

## 2020-04-24 VITALS — BP 120/39 | HR 73 | Ht 75.0 in | Wt 258.8 lb

## 2020-04-24 DIAGNOSIS — Z79891 Long term (current) use of opiate analgesic: Secondary | ICD-10-CM | POA: Insufficient documentation

## 2020-04-24 DIAGNOSIS — Z86010 Personal history of colonic polyps: Secondary | ICD-10-CM | POA: Diagnosis not present

## 2020-04-24 DIAGNOSIS — Z96653 Presence of artificial knee joint, bilateral: Secondary | ICD-10-CM | POA: Diagnosis not present

## 2020-04-24 DIAGNOSIS — Z952 Presence of prosthetic heart valve: Secondary | ICD-10-CM | POA: Diagnosis not present

## 2020-04-24 DIAGNOSIS — Z7901 Long term (current) use of anticoagulants: Secondary | ICD-10-CM | POA: Insufficient documentation

## 2020-04-24 DIAGNOSIS — I5042 Chronic combined systolic (congestive) and diastolic (congestive) heart failure: Secondary | ICD-10-CM | POA: Diagnosis not present

## 2020-04-24 DIAGNOSIS — Z8719 Personal history of other diseases of the digestive system: Secondary | ICD-10-CM | POA: Diagnosis not present

## 2020-04-24 DIAGNOSIS — I4892 Unspecified atrial flutter: Secondary | ICD-10-CM | POA: Insufficient documentation

## 2020-04-24 DIAGNOSIS — I4819 Other persistent atrial fibrillation: Secondary | ICD-10-CM

## 2020-04-24 DIAGNOSIS — K219 Gastro-esophageal reflux disease without esophagitis: Secondary | ICD-10-CM | POA: Insufficient documentation

## 2020-04-24 DIAGNOSIS — M1990 Unspecified osteoarthritis, unspecified site: Secondary | ICD-10-CM | POA: Insufficient documentation

## 2020-04-24 DIAGNOSIS — Z79899 Other long term (current) drug therapy: Secondary | ICD-10-CM | POA: Diagnosis not present

## 2020-04-24 DIAGNOSIS — Z8249 Family history of ischemic heart disease and other diseases of the circulatory system: Secondary | ICD-10-CM | POA: Diagnosis not present

## 2020-04-24 DIAGNOSIS — I420 Dilated cardiomyopathy: Secondary | ICD-10-CM | POA: Diagnosis not present

## 2020-04-24 DIAGNOSIS — D6869 Other thrombophilia: Secondary | ICD-10-CM | POA: Diagnosis not present

## 2020-04-24 DIAGNOSIS — Z6832 Body mass index (BMI) 32.0-32.9, adult: Secondary | ICD-10-CM | POA: Diagnosis not present

## 2020-04-24 DIAGNOSIS — E669 Obesity, unspecified: Secondary | ICD-10-CM | POA: Insufficient documentation

## 2020-04-24 DIAGNOSIS — I052 Rheumatic mitral stenosis with insufficiency: Secondary | ICD-10-CM | POA: Insufficient documentation

## 2020-04-24 DIAGNOSIS — Z96641 Presence of right artificial hip joint: Secondary | ICD-10-CM | POA: Insufficient documentation

## 2020-04-24 DIAGNOSIS — Z981 Arthrodesis status: Secondary | ICD-10-CM | POA: Insufficient documentation

## 2020-04-24 DIAGNOSIS — Z7982 Long term (current) use of aspirin: Secondary | ICD-10-CM | POA: Insufficient documentation

## 2020-04-24 DIAGNOSIS — I110 Hypertensive heart disease with heart failure: Secondary | ICD-10-CM | POA: Insufficient documentation

## 2020-04-24 MED ORDER — RIVAROXABAN 20 MG PO TABS: 20.0000 mg | ORAL_TABLET | Freq: Every day | ORAL | 3 refills | Status: DC

## 2020-04-24 NOTE — Progress Notes (Signed)
Primary Care Physician: [name], MD Primary Cardiologist: Dr Gwenlyn Found Primary Electrophysiologist: none Referring Physician: Dr Gwynneth Aliment is a 64 y.o. male with a history of mitral valve prolapse and severe mitral regurgitation,persistent atrial fibrillation and atrial flutter, chronic combined systolic and diastolic congestive heart failure,hypertension, and degenerative arthritis who presents for consultation in the [facility]. Patient underwent minimally invasive MVR and MAZE with Dr Roxy Manns 03/2019. He did have some post operative afib and was maintained on amiodarone. Patient is on warfarin for a CHADS2VASC score of 3. Patient denies any heart racing or palpitations. He denies bleeding issues on anticoagulation.   Today, he denies symptoms of palpitations, chest pain, shortness of breath, orthopnea, PND, lower extremity edema, dizziness, presyncope, syncope, snoring, daytime somnolence, bleeding, or neurologic sequela. The patient is tolerating medications without difficulties and is otherwise without complaint today.    Atrial Fibrillation Risk Factors:  he does not have symptoms or diagnosis of sleep apnea. he does not have a history of rheumatic fever.   he has a BMI of Body mass index is 32.35 kg/m.Marland Kitchen Filed Weights   04/24/20 1457  Weight: 117.4 kg    Family History  Problem Relation Age of Onset  . Hypertension Father   . Colon cancer Father        dx in his late 67's  . Diabetes Mother   . Stomach cancer Neg Hx   . Esophageal cancer Neg Hx   . Pancreatic cancer Neg Hx   . Rectal cancer Neg Hx      Atrial Fibrillation Management history:  Previous antiarrhythmic drugs: amiodarone Previous cardioversions: 01/2019, 06/2019 Previous ablations: none, MAZE 03/2019 CHADS2VASC score: 3 Anticoagulation history: Eliquis, warfarin    Past Medical History:  Diagnosis Date  . Acute on chronic systolic (congestive) heart failure  ([place]) 12/30/2018  . Allergy   . Arthritis   . Atrial flutter with rapid ventricular response ([place])   . Chronic systolic (congestive) heart failure ([place])   . Dilated aortic root ([place]) 01/04/2019  . Dilated cardiomyopathy ([place]) 01/04/2019  . GERD (gastroesophageal reflux disease)    history of  . Heart murmur   . History of colon polyps   . Hypertension   . Insomnia   . Mitral valve prolapse   . Mitral valve regurgitation   . Persistent atrial fibrillation ([place]) 12/02/2018  . S/P Maze operation for atrial fibrillation 03/21/2019   Complete bilateral atrial lesion set using cryothermy and bipolar radiofrequency ablation with clipping of LA appendage via right mini thoracotomy approach  . S/P minimally invasive mitral valve repair 03/21/2019   Complex valvuloplasty including triangular resection of posterior leaflet, artificial Gore-tex neochord placement x4 and 36 mm Sorin Memo 4D ring annuloplasty via right mini thoracotomy approach  . Seizures ([place])    had one approx. 30 yrs ago,has not had any since  . Severe mitral regurgitation    Past Surgical History:  Procedure Laterality Date  . ANKLE FUSION  left   Dec. 2012  . ARTHROSCOPY KNEE W/ DRILLING  bilateral   2012  . CARDIAC VALVE REPLACEMENT    . CARDIOVERSION N/A 01/03/2019   Procedure: CARDIOVERSION;  Surgeon: Sueanne Margarita, MD;  Location: [facility] ENDOSCOPY;  Service: Cardiovascular;  Laterality: N/A;  . CARDIOVERSION N/A 06/23/2019   Procedure: CARDIOVERSION;  Surgeon: Fay Records, MD;  Location: [place];  Service: Cardiovascular;  Laterality: N/A;  . CLIPPING OF ATRIAL APPENDAGE  03/21/2019   Procedure:  Clipping Of Atrial Appendage using 53mm Atricure Pro2 Clip;  Surgeon: Rexene Alberts, MD;  Location: [place];  Service: Open Heart Surgery;;  . COLONOSCOPY     x2  . FOOT ARTHRODESIS  06/23/2012   Procedure: ARTHRODESIS FOOT;  Surgeon: Wylene Simmer, MD;  Location: [place];  Service: Orthopedics;  Laterality: Left;  Left Subtalar and  Talonavicular Joint Revision Arthrodesis  Aspiration of Bone Marrow from Left Hip   . HAIR TRANSPLANT    . HARDWARE REMOVAL  06/23/2012   Procedure: HARDWARE REMOVAL;  Surgeon: Wylene Simmer, MD;  Location: [place];  Service: Orthopedics;  Laterality: Left;  Removal of Deep Implant  X's 3  . INSERTION OF MESH N/A 07/10/2016   Procedure: INSERTION OF MESH;  Surgeon: Coralie Keens, MD;  Location: [place];  Service: General;  Laterality: N/A;  . JOINT REPLACEMENT     right hip  01-2011  . LEFT HEART CATH AND CORONARY ANGIOGRAPHY N/A 03/16/2019   Procedure: LEFT HEART CATH AND CORONARY ANGIOGRAPHY;  Surgeon: Sherren Mocha, MD;  Location: [place] [facility];  Service: Cardiovascular;  Laterality: N/A;  . LIMB SPARING RESECTION HIP W/ SADDLE JOINT REPLACEMENT Right   . MINIMALLY INVASIVE MAZE PROCEDURE N/A 03/21/2019   Procedure: MINIMALLY INVASIVE MAZE PROCEDURE;  Surgeon: Rexene Alberts, MD;  Location: [facility];  Service: Open Heart Surgery;  Laterality: N/A;  . MITRAL VALVE REPAIR Right 03/21/2019   Procedure: MINIMALLY INVASIVE MITRAL VALVE REPAIR (MVR) using Memo 4D ring size 36;  Surgeon: Rexene Alberts, MD;  Location: [place];  Service: Open Heart Surgery;  Laterality: Right;  . TEE WITHOUT CARDIOVERSION N/A 01/03/2019   Procedure: TRANSESOPHAGEAL ECHOCARDIOGRAM (TEE);  Surgeon: Sueanne Margarita, MD;  Location: [facility] ENDOSCOPY;  Service: Cardiovascular;  Laterality: N/A;  . TEE WITHOUT CARDIOVERSION N/A 03/21/2019   Procedure: TRANSESOPHAGEAL ECHOCARDIOGRAM (TEE);  Surgeon: Rexene Alberts, MD;  Location: [place];  Service: Open Heart Surgery;  Laterality: N/A;  . TEMPORARY PACEMAKER N/A 03/22/2019   Procedure: TEMPORARY PACEMAKER;  Surgeon: Belva Crome, MD;  Location: [place] CV LAB;  Service: Cardiovascular;  Laterality: N/A;  . TOTAL KNEE ARTHROPLASTY Right 01/19/2014   Procedure: RIGHT TOTAL KNEE ARTHROPLASTY, Steroid injection left knee;  Surgeon: Mcarthur Rossetti, MD;   Location: [facility];  Service: Orthopedics;  Laterality: Right;  . TOTAL KNEE ARTHROPLASTY Left 10/23/2014   Procedure: LEFT TOTAL KNEE ARTHROPLASTY;  Surgeon: Mcarthur Rossetti, MD;  Location: [facility];  Service: Orthopedics;  Laterality: Left;  . UMBILICAL HERNIA REPAIR N/A 07/10/2016   Procedure: UMBILICAL HERNIA REPAIR;  Surgeon: Coralie Keens, MD;  Location: [place];  Service: General;  Laterality: N/A;    Current Outpatient Medications  Medication Sig Dispense Refill  . ALPRAZolam (XANAX) 0.25 MG tablet Take 0.25 mg by mouth at bedtime as needed for sleep.    Marland Kitchen aspirin EC 81 MG EC tablet Take 1 tablet (81 mg total) by mouth daily.    Marland Kitchen atorvastatin (LIPITOR) 20 MG tablet Take 20 mg by mouth daily.    Marland Kitchen docusate sodium (COLACE) 100 MG capsule Take 300 mg by mouth daily.    Marland Kitchen ENTRESTO 24-26 MG Take 1 tablet by mouth twice daily 180 tablet 3  . HYDROcodone-acetaminophen (NORCO/VICODIN) 5-325 MG tablet Take 1 tablet by mouth every 6 (six) hours as needed for moderate pain.    . metoprolol tartrate (LOPRESSOR) 25 MG tablet Take 1/2 (one-half) tablet by mouth twice daily 30 tablet  10  . Multiple Vitamin (MULTIVITAMIN WITH MINERALS) TABS tablet Take 1 tablet by mouth daily.    . potassium chloride (KLOR-CON) 10 MEQ tablet TAKE 1  BY MOUTH TWICE DAILY 180 tablet 1  . spironolactone (ALDACTONE) 25 MG tablet TAKE 1 TABLET BY MOUTH ONCE DAILY . APPOINTMENT REQUIRED FOR FUTURE REFILLS 90 tablet 3  . torsemide (DEMADEX) 20 MG tablet TAKE 1 TABLET BY MOUTH ONCE DAILY . APPOINTMENT REQUIRED FOR FUTURE REFILLS 90 tablet 3  . rivaroxaban (XARELTO) 20 MG TABS tablet Take 1 tablet (20 mg total) by mouth daily with supper. 30 tablet 3   No current facility-administered medications for this encounter.    No Known Allergies  Social History   Socioeconomic History  . Marital status: Married    Spouse name: Not on file  . Number of children: 1  . Years of education: Not on file  .  Highest education level: Not on file  Occupational History  . Occupation: Pension scheme manager  Tobacco Use  . Smoking status: Never Smoker  . Smokeless tobacco: Never Used  Vaping Use  . Vaping Use: Never used  Substance and Sexual Activity  . Alcohol use: Yes    Comment: 1 or 2 drink daily  . Drug use: No  . Sexual activity: Yes  Other Topics Concern  . Not on file  Social History Narrative  . Not on file   Social Determinants of Health   Financial Resource Strain:   . Difficulty of Paying Living Expenses:   Food Insecurity:   . Worried About Charity fundraiser in the Last Year:   . Arboriculturist in the Last Year:   Transportation Needs:   . Film/video editor (Medical):   Marland Kitchen Lack of Transportation (Non-Medical):   Physical Activity:   . Days of Exercise per Week:   . Minutes of Exercise per Session:   Stress:   . Feeling of Stress :   Social Connections:   . Frequency of Communication with Friends and Family:   . Frequency of Social Gatherings with Friends and Family:   . Attends Religious Services:   . Active Member of Clubs or Organizations:   . Attends Archivist Meetings:   Marland Kitchen Marital Status:   Intimate Partner Violence:   . Fear of Current or Ex-Partner:   . Emotionally Abused:   Marland Kitchen Physically Abused:   . Sexually Abused:      ROS- All systems are reviewed and negative except as per the HPI above.  Physical Exam: Vitals:   04/24/20 1457  BP: (!) 120/39  Pulse: 73  Weight: 117.4 kg  Height: 6\' 3"  (1.905 m)    GEN- The patient is well appearing obese male, alert and oriented x 3 today.   Head- normocephalic, atraumatic Eyes-  Sclera clear, conjunctiva pink Ears- hearing intact Oropharynx- clear Neck- supple  Lungs- Clear to ausculation bilaterally, normal work of breathing Heart- Regular rate and rhythm, occasional ectopic beat, no murmurs, rubs or gallops  GI- soft, NT, ND, + BS Extremities- no clubbing, cyanosis, or edema MS- no  significant deformity or atrophy Skin- no rash or lesion Psych- euthymic mood, full affect Neuro- strength and sensation are intact  Wt Readings from Last 3 Encounters:  04/24/20 117.4 kg  03/25/20 114.8 kg  02/07/20 116.8 kg    EKG today demonstrates SR HR 73, PVCs, 1st degree AV block, RBBB, PR 216, QRS 166, QTc 517  Echo 05/26/19 demonstrated  1.  The left ventricle has normal systolic function, with an ejection  fraction of 55-60%. The cavity size was mildly dilated. Left ventricular  diastolic function could not be evaluated secondary to atrial  fibrillation. Elevated left ventricular  end-diastolic pressure.  2. The right ventricle has normal systolic function. The cavity was  mildly enlarged. There is no increase in right ventricular wall thickness.  Right ventricular systolic pressure could not be assessed.  3. S/P Mitral valve repair. Moderate thickening of the mitral valve  leaflet and calcification and thickening of chordae tendinae. Mild mitral  valve stenosis. MV Mean grad: 5.0 mmHg. MV Area (PHT): 1.54 cm. There is  no MR by colorflow doppler.  4. The aorta is abnormal in size and structure.  5. There is mild dilatation of the aortic root and of the ascending aorta  measuring 39 mm and 57mm respectively.  6. The aortic valve is tricuspid. Mild sclerosis of the aortic valve.  Aortic valve regurgitation was not assessed by color flow Doppler.  7. Left atrial size was mildly dilated.  8. Compared to prior echo, there is no MR and MV has been repaired.   Epic records are reviewed at length today  CHA2DS2-VASc Score = 3  The patient's score is based upon: CHF History: 1 HTN History: 1 Age : 1 Diabetes History: 0 Stroke History: 0 Vascular Disease History: 0 Gender: 0      ASSESSMENT AND PLAN: 1. Persistent Atrial Fibrillation (ICD10:  I48.19) The patient's CHA2DS2-VASc score is 3, indicating a 3.2% annual risk of stroke.   S/p MAZE 03/2019. Patient  appears to be maintaining SR off AAD. We discussed his stroke risk to day and the risks and benefits of anticoagulation. With his CHADS2VASC score, long term anticoagulation is indicated.  Will plan to change to DOAC at this time. Last INR 2.7 Will stop warfarin and start Xarelto 20 mg daily.  2. Secondary Hypercoagulable State (ICD10:  D68.69) The patient is at significant risk for stroke/thromboembolism based upon his CHA2DS2-VASc Score of 3.  Start Rivaroxaban (Xarelto).   3. Obesity Body mass index is 32.35 kg/m. Lifestyle modification was discussed at length including regular exercise and weight reduction.  4. Mitral valve regurgitation S/p minimally invasive repair 03/2019.  5. HTN Stable, no changes today.   Follow up in the AF clinic in one month.    [facility] [address] [place], [place] [address] [phone] 04/24/2020 4:56 PM

## 2020-05-22 ENCOUNTER — Telehealth: Payer: Self-pay | Admitting: Orthopaedic Surgery

## 2020-05-22 NOTE — Telephone Encounter (Signed)
Patient called requesting a call back from Dr. Ninfa Linden. Patient did not specify want was needed. Please call patient at [phone] or [phone].

## 2020-05-22 NOTE — Telephone Encounter (Signed)
Tried calling to discuss. No answer. Left detailed VM that Dr Ninfa Linden out of office until first of next week. Advised for him to call us back to let us know further detail what he needed.

## 2020-05-23 ENCOUNTER — Ambulatory Visit (HOSPITAL_COMMUNITY)
Admission: RE | Admit: 2020-05-23 | Discharge: 2020-05-23 | Disposition: A | Discharge status: Home / Self Care | Payer: Medicare HMO | Source: Ambulatory Visit | Attending: Physician Assistant | Admitting: Physician Assistant

## 2020-05-23 ENCOUNTER — Telehealth: Payer: Self-pay | Admitting: Pharmacist

## 2020-05-23 ENCOUNTER — Encounter (HOSPITAL_COMMUNITY): Payer: Self-pay | Admitting: Physician Assistant

## 2020-05-23 ENCOUNTER — Other Ambulatory Visit: Payer: Self-pay

## 2020-05-23 VITALS — BP 106/60 | HR 79 | Ht 75.0 in | Wt 257.2 lb

## 2020-05-23 DIAGNOSIS — Z6832 Body mass index (BMI) 32.0-32.9, adult: Secondary | ICD-10-CM | POA: Diagnosis not present

## 2020-05-23 DIAGNOSIS — Z7982 Long term (current) use of aspirin: Secondary | ICD-10-CM | POA: Insufficient documentation

## 2020-05-23 DIAGNOSIS — Z7901 Long term (current) use of anticoagulants: Secondary | ICD-10-CM | POA: Insufficient documentation

## 2020-05-23 DIAGNOSIS — I4892 Unspecified atrial flutter: Secondary | ICD-10-CM | POA: Diagnosis not present

## 2020-05-23 DIAGNOSIS — Z79899 Other long term (current) drug therapy: Secondary | ICD-10-CM | POA: Insufficient documentation

## 2020-05-23 DIAGNOSIS — Z8249 Family history of ischemic heart disease and other diseases of the circulatory system: Secondary | ICD-10-CM | POA: Diagnosis not present

## 2020-05-23 DIAGNOSIS — I340 Nonrheumatic mitral (valve) insufficiency: Secondary | ICD-10-CM | POA: Insufficient documentation

## 2020-05-23 DIAGNOSIS — I110 Hypertensive heart disease with heart failure: Secondary | ICD-10-CM | POA: Insufficient documentation

## 2020-05-23 DIAGNOSIS — R011 Cardiac murmur, unspecified: Secondary | ICD-10-CM | POA: Insufficient documentation

## 2020-05-23 DIAGNOSIS — I5022 Chronic systolic (congestive) heart failure: Secondary | ICD-10-CM | POA: Insufficient documentation

## 2020-05-23 DIAGNOSIS — I4819 Other persistent atrial fibrillation: Secondary | ICD-10-CM | POA: Diagnosis present

## 2020-05-23 DIAGNOSIS — E669 Obesity, unspecified: Secondary | ICD-10-CM | POA: Diagnosis not present

## 2020-05-23 DIAGNOSIS — D6869 Other thrombophilia: Secondary | ICD-10-CM | POA: Diagnosis not present

## 2020-05-23 NOTE — Telephone Encounter (Signed)
Patient to call back and discuss transition from xarelto back to warfarin.

## 2020-05-23 NOTE — Progress Notes (Signed)
Primary Care Physician: [name], MD Primary Cardiologist: Dr Gwenlyn Found Primary Electrophysiologist: none Referring Physician: Dr Gwynneth Aliment is a 64 y.o. male with a history of mitral valve prolapse and severe mitral regurgitation,persistent atrial fibrillation and atrial flutter, chronic combined systolic and diastoliccongestive heart failure,hypertension, and degenerative arthritis who presents for follow up in the [facility]. Patient underwent minimally invasive MVR and MAZE with Dr Roxy Manns 03/2019. He did have some post operative afib and was maintained on amiodarone. Patient is on warfarin for a CHADS2VASC score of 3.   On follow up today, patient reports that he has done well from a cardiac standpoint. He denies any heart racing or palpitations. His primary concern today is the cost of his medications.   Today, he denies symptoms of palpitations, chest pain, shortness of breath, orthopnea, PND, lower extremity edema, dizziness, presyncope, syncope, snoring, daytime somnolence, bleeding, or neurologic sequela. The patient is tolerating medications without difficulties and is otherwise without complaint today.    Atrial Fibrillation Risk Factors:  he does not have symptoms or diagnosis of sleep apnea. he does not have a history of rheumatic fever.   he has a BMI of Body mass index is 32.15 kg/m.Marland Kitchen Filed Weights   05/23/20 1330  Weight: (!) 116.7 kg    Family History  Problem Relation Age of Onset   Hypertension Father    Colon cancer Father        dx in his late 73's   Diabetes Mother    Stomach cancer Neg Hx    Esophageal cancer Neg Hx    Pancreatic cancer Neg Hx    Rectal cancer Neg Hx      Atrial Fibrillation Management history:  Previous antiarrhythmic drugs: amiodarone Previous cardioversions: 01/2019, 06/2019 Previous ablations: none, MAZE 03/2019 CHADS2VASC score: 3 Anticoagulation history: Eliquis, warfarin,  Xarelto   Past Medical History:  Diagnosis Date   Acute on chronic systolic (congestive) heart failure (HCC) 12/30/2018   Allergy    Arthritis    Atrial flutter with rapid ventricular response (HCC)    Chronic systolic (congestive) heart failure (HCC)    Dilated aortic root ([place]) 01/04/2019   Dilated cardiomyopathy ([place]) 01/04/2019   GERD (gastroesophageal reflux disease)    history of   Heart murmur    History of colon polyps    Hypertension    Insomnia    Mitral valve prolapse    Mitral valve regurgitation    Persistent atrial fibrillation ([place]) 12/02/2018   S/P Maze operation for atrial fibrillation 03/21/2019   Complete bilateral atrial lesion set using cryothermy and bipolar radiofrequency ablation with clipping of LA appendage via right mini thoracotomy approach   S/P minimally invasive mitral valve repair 03/21/2019   Complex valvuloplasty including triangular resection of posterior leaflet, artificial Gore-tex neochord placement x4 and 36 mm Sorin Memo 4D ring annuloplasty via right mini thoracotomy approach   Seizures ([place])    had one approx. 30 yrs ago,has not had any since   Severe mitral regurgitation    Past Surgical History:  Procedure Laterality Date   ANKLE FUSION  left   Dec. 2012   ARTHROSCOPY KNEE W/ DRILLING  bilateral   2012   CARDIAC VALVE REPLACEMENT     CARDIOVERSION N/A 01/03/2019   Procedure: CARDIOVERSION;  Surgeon: Sueanne Margarita, MD;  [facility] ENDOSCOPY;  Service: Cardiovascular;  Laterality: N/A;   CARDIOVERSION N/A 06/23/2019   Procedure: CARDIOVERSION;  Surgeon: Fay Records,  MD;  Location: [place];  Service: Cardiovascular;  Laterality: N/A;   CLIPPING OF ATRIAL APPENDAGE  03/21/2019   Procedure: Clipping Of Atrial Appendage using 71mm Atricure Pro2 Clip;  Surgeon: Rexene Alberts, MD;  Location: [place];  Service: Open Heart Surgery;;   COLONOSCOPY     x2   FOOT ARTHRODESIS  06/23/2012   Procedure: ARTHRODESIS  FOOT;  Surgeon: Wylene Simmer, MD;  Location: [place];  Service: Orthopedics;  Laterality: Left;  Left Subtalar and Talonavicular Joint Revision Arthrodesis  Aspiration of Bone Marrow from Left Hip    HAIR TRANSPLANT     HARDWARE REMOVAL  06/23/2012   Procedure: HARDWARE REMOVAL;  Surgeon: Wylene Simmer, MD;  Location: [place];  Service: Orthopedics;  Laterality: Left;  Removal of Deep Implant  X's 3   INSERTION OF MESH N/A 07/10/2016   Procedure: INSERTION OF MESH;  Surgeon: Coralie Keens, MD;  Location: [place];  Service: General;  Laterality: N/A;   JOINT REPLACEMENT     right hip  01-2011   LEFT HEART CATH AND CORONARY ANGIOGRAPHY N/A 03/16/2019   Procedure: LEFT HEART CATH AND CORONARY ANGIOGRAPHY;  Surgeon: Sherren Mocha, MD;  Location: [place] CV LAB;  Service: Cardiovascular;  Laterality: N/A;   LIMB SPARING RESECTION HIP W/ SADDLE JOINT REPLACEMENT Right    MINIMALLY INVASIVE MAZE PROCEDURE N/A 03/21/2019   Procedure: MINIMALLY INVASIVE MAZE PROCEDURE;  Surgeon: Rexene Alberts, MD;  Location: [place];  Service: Open Heart Surgery;  Laterality: N/A;   MITRAL VALVE REPAIR Right 03/21/2019   Procedure: MINIMALLY INVASIVE MITRAL VALVE REPAIR (MVR) using Memo 4D ring size 36;  Surgeon: Rexene Alberts, MD;  Location: [place];  Service: Open Heart Surgery;  Laterality: Right;   TEE WITHOUT CARDIOVERSION N/A 01/03/2019   Procedure: TRANSESOPHAGEAL ECHOCARDIOGRAM (TEE);  Surgeon: Sueanne Margarita, MD;  Location[facility] ENDOSCOPY;  Service: Cardiovascular;  Laterality: N/A;   TEE WITHOUT CARDIOVERSION N/A 03/21/2019   Procedure: TRANSESOPHAGEAL ECHOCARDIOGRAM (TEE);  Surgeon: Rexene Alberts, MD;  Location: [place];  Service: Open Heart Surgery;  Laterality: N/A;   TEMPORARY PACEMAKER N/A 03/22/2019   Procedure: TEMPORARY PACEMAKER;  Surgeon: Belva Crome, MD;  Location: [place] CV LAB;  Service: Cardiovascular;  Laterality: N/A;   TOTAL KNEE ARTHROPLASTY Right 01/19/2014    Procedure: RIGHT TOTAL KNEE ARTHROPLASTY, Steroid injection left knee;  Surgeon: Mcarthur Rossetti, MD;  Location: [facility];  Service: Orthopedics;  Laterality: Right;   TOTAL KNEE ARTHROPLASTY Left 10/23/2014   Procedure: LEFT TOTAL KNEE ARTHROPLASTY;  Surgeon: Mcarthur Rossetti, MD;  Location: [facility];  Service: Orthopedics;  Laterality: Left;   UMBILICAL HERNIA REPAIR N/A 07/10/2016   Procedure: UMBILICAL HERNIA REPAIR;  Surgeon: Coralie Keens, MD;  Location: [place];  Service: General;  Laterality: N/A;    Current Outpatient Medications  Medication Sig Dispense Refill   ALPRAZolam (XANAX) 0.25 MG tablet Take 0.25 mg by mouth at bedtime as needed for sleep.     aspirin EC 81 MG EC tablet Take 1 tablet (81 mg total) by mouth daily.     atorvastatin (LIPITOR) 20 MG tablet Take 20 mg by mouth daily.     docusate sodium (COLACE) 100 MG capsule Take 300 mg by mouth daily.     ENTRESTO 24-26 MG Take 1 tablet by mouth twice daily 180 tablet 3   HYDROcodone-acetaminophen (NORCO/VICODIN) 5-325 MG tablet Take 1 tablet by mouth every 6 (six) hours as needed for  moderate pain.     metoprolol tartrate (LOPRESSOR) 25 MG tablet Take 1/2 (one-half) tablet by mouth twice daily 30 tablet 10   Multiple Vitamin (MULTIVITAMIN WITH MINERALS) TABS tablet Take 1 tablet by mouth daily.     potassium chloride (KLOR-CON) 10 MEQ tablet TAKE 1  BY MOUTH TWICE DAILY 180 tablet 1   rivaroxaban (XARELTO) 20 MG TABS tablet Take 1 tablet (20 mg total) by mouth daily with supper. 30 tablet 3   spironolactone (ALDACTONE) 25 MG tablet TAKE 1 TABLET BY MOUTH ONCE DAILY . APPOINTMENT REQUIRED FOR FUTURE REFILLS 90 tablet 3   torsemide (DEMADEX) 20 MG tablet TAKE 1 TABLET BY MOUTH ONCE DAILY . APPOINTMENT REQUIRED FOR FUTURE REFILLS 90 tablet 3   No current facility-administered medications for this encounter.    No Known Allergies  Social History   Socioeconomic History   Marital  status: Married    Spouse name: Not on file   Number of children: 1   Years of education: Not on file   Highest education level: Not on file  Occupational History   Occupation: Pension scheme manager  Tobacco Use   Smoking status: Never Smoker   Smokeless tobacco: Never Used  Vaping Use   Vaping Use: Never used  Substance and Sexual Activity   Alcohol use: Yes    Alcohol/week: 2.0 - 4.0 standard drinks    Types: 1 - 2 Standard drinks or equivalent, 1 - 2 Glasses of wine per week    Comment: 1 or 2 drink daily   Drug use: No   Sexual activity: Yes  Other Topics Concern   Not on file  Social History Narrative   Not on file   Social Determinants of Health   Financial Resource Strain:    Difficulty of Paying Living Expenses:   Food Insecurity:    Worried About Charity fundraiser in the Last Year:    Arboriculturist in the Last Year:   Transportation Needs:    Film/video editor (Medical):    Lack of Transportation (Non-Medical):   Physical Activity:    Days of Exercise per Week:    Minutes of Exercise per Session:   Stress:    Feeling of Stress :   Social Connections:    Frequency of Communication with Friends and Family:    Frequency of Social Gatherings with Friends and Family:    Attends Religious Services:    Active Member of Clubs or Organizations:    Attends Music therapist:    Marital Status:   Intimate Partner Violence:    Fear of Current or Ex-Partner:    Emotionally Abused:    Physically Abused:    Sexually Abused:      ROS- All systems are reviewed and negative except as per the HPI above.  Physical Exam: Vitals:   05/23/20 1330  BP: (!) 106/60  Pulse: 79  Weight: (!) 116.7 kg  Height: 6\' 3"  (1.905 m)    GEN- The patient is well appearing obese male, alert and oriented x 3 today.   HEENT-head normocephalic, atraumatic, sclera clear, conjunctiva pink, hearing intact, trachea midline. Lungs- Clear to  ausculation bilaterally, normal work of breathing Heart- Regular rate and rhythm, occasional ectopic beat, no murmurs, rubs or gallops  GI- soft, NT, ND, + BS Extremities- no clubbing, cyanosis, or edema MS- no significant deformity or atrophy Skin- no rash or lesion Psych- euthymic mood, full affect Neuro- strength and sensation are intact  Wt Readings from Last 3 Encounters:  05/23/20 (!) 116.7 kg  04/24/20 117.4 kg  03/25/20 114.8 kg    EKG today demonstrates SR HR 79, RBBB, LAFB, PVCs, PR 166, QRS 156, QTc 486  Echo 05/26/19 demonstrated  1. The left ventricle has normal systolic function, with an ejection  fraction of 55-60%. The cavity size was mildly dilated. Left ventricular  diastolic function could not be evaluated secondary to atrial  fibrillation. Elevated left ventricular  end-diastolic pressure.  2. The right ventricle has normal systolic function. The cavity was  mildly enlarged. There is no increase in right ventricular wall thickness.  Right ventricular systolic pressure could not be assessed.  3. S/P Mitral valve repair. Moderate thickening of the mitral valve  leaflet and calcification and thickening of chordae tendinae. Mild mitral  valve stenosis. MV Mean grad: 5.0 mmHg. MV Area (PHT): 1.54 cm. There is  no MR by colorflow doppler.  4. The aorta is abnormal in size and structure.  5. There is mild dilatation of the aortic root and of the ascending aorta  measuring 39 mm and 70mm respectively.  6. The aortic valve is tricuspid. Mild sclerosis of the aortic valve.  Aortic valve regurgitation was not assessed by color flow Doppler.  7. Left atrial size was mildly dilated.  8. Compared to prior echo, there is no MR and MV has been repaired.   Epic records are reviewed at length today  CHA2DS2-VASc Score = 3  The patient's score is based upon: CHF History: 1 HTN History: 1 Age : 1 Diabetes History: 0 Stroke History: 0 Vascular Disease History:  0 Gender: 0      ASSESSMENT AND PLAN: 1. Persistent Atrial Fibrillation (ICD10:  I48.19) The patient's CHA2DS2-VASc score is 3, indicating a 3.2% annual risk of stroke.   S/p MAZE 03/2019. MAZE questionnaire completed. Patient appears to be maintaining SR. Patient concerned about the price of Xarelto. Offered patient assistance information. He would like to transition back to warfarin. Message sent to [place]. Samples of Xarelto given until he can transition.   2. Secondary Hypercoagulable State (ICD10:  D68.69) The patient is at significant risk for stroke/thromboembolism based upon his CHA2DS2-VASc Score of 3.  Start Rivaroxaban (Xarelto).   3. Obesity Body mass index is 32.15 kg/m. Lifestyle modification was discussed and encouraged including regular physical activity and weight reduction.  4. Mitral valve regurgitation S/p minimally invasive repair 03/2019.  5. HTN Stable, no change today.   Follow up in the AF clinic in one year.    [facility] [address] [place], [place] [address] [phone] 05/23/2020 2:07 PM

## 2020-05-23 NOTE — Telephone Encounter (Signed)
-----   Message from Hinda Kehr, [place] sent at 05/23/2020  2:06 PM EDT ----- Regarding: Coumadin Patient would like to go back on Coumadin from Xarelto per Adline Peals, PA-C  Thanks  [facility]

## 2020-05-24 NOTE — Telephone Encounter (Signed)
Lmom: patient will will need instructions to transition from xarelto back to warfarin.

## 2020-05-28 ENCOUNTER — Telehealth: Payer: Self-pay | Admitting: Cardiovascular Disease

## 2020-05-28 NOTE — Telephone Encounter (Signed)
I have not seen [name] for close to a year steroids time for a return office visit.

## 2020-05-28 NOTE — Telephone Encounter (Signed)
Spoke with pt and needs to switch Entresto to something cheaper is unable to afford Pt is in donut hole and Entresto and Xarelto is going to cost $800.00 per month Per pt is going to switch back to Coumadin but needs Entresto changed ,as well .Will forward to Dr Gwenlyn Found for review .Adonis Housekeeper

## 2020-05-28 NOTE — Telephone Encounter (Signed)
2 weeks sample provided  Please call and schedule appt with dr Gwenlyn Found as soon as possible.

## 2020-05-28 NOTE — Telephone Encounter (Signed)
Transition instructions and f/u INR visit scheduled.

## 2020-05-28 NOTE — Telephone Encounter (Signed)
New Message:     Pt wants to know if there is something else that he can take in the place of the [facility], it tis too expensive. He does not wants to take the Xarelto again.

## 2020-05-28 NOTE — Telephone Encounter (Signed)
Busy signal Will try later ./cy 

## 2020-05-30 NOTE — Telephone Encounter (Signed)
APPT MADE WITH HAO MENG PA VIRTUAL VISIT FOR 06/17/20 AT 9:15 AM PT AWARE .Adonis Housekeeper

## 2020-06-05 ENCOUNTER — Telehealth: Payer: Self-pay | Admitting: Gastroenterology

## 2020-06-05 MED ORDER — DILTIAZEM GEL 2 %: 1.0000 "application " | Freq: Three times a day (TID) | CUTANEOUS | 0 refills | Status: DC

## 2020-06-05 NOTE — Telephone Encounter (Signed)
Patient requesting refill on Diltiazem gel to [facility]

## 2020-06-05 NOTE — Telephone Encounter (Signed)
Patient is scheduled to see Tye Savoy, NP on 07/11/20. A refill of diltiazem gel was sent to the pharmacy and patient notified to keep appt.

## 2020-06-10 ENCOUNTER — Ambulatory Visit (INDEPENDENT_AMBULATORY_CARE_PROVIDER_SITE_OTHER): Payer: Medicare HMO

## 2020-06-10 ENCOUNTER — Other Ambulatory Visit: Payer: Self-pay

## 2020-06-10 DIAGNOSIS — Z5181 Encounter for therapeutic drug level monitoring: Secondary | ICD-10-CM | POA: Diagnosis not present

## 2020-06-10 DIAGNOSIS — Z7901 Long term (current) use of anticoagulants: Secondary | ICD-10-CM

## 2020-06-10 DIAGNOSIS — I4819 Other persistent atrial fibrillation: Secondary | ICD-10-CM

## 2020-06-10 DIAGNOSIS — I4892 Unspecified atrial flutter: Secondary | ICD-10-CM | POA: Diagnosis not present

## 2020-06-10 DIAGNOSIS — I4891 Unspecified atrial fibrillation: Secondary | ICD-10-CM | POA: Insufficient documentation

## 2020-06-10 LAB — POCT INR: INR: 1.5 — AB (ref 2.0–3.0)

## 2020-06-10 NOTE — Patient Instructions (Signed)
Take 2 tablets tonight and then continue 1 tablet daily.  [phone]

## 2020-06-11 ENCOUNTER — Other Ambulatory Visit: Payer: Self-pay | Admitting: Cardiovascular Disease

## 2020-06-17 ENCOUNTER — Telehealth (INDEPENDENT_AMBULATORY_CARE_PROVIDER_SITE_OTHER): Payer: Medicare HMO | Admitting: Physician Assistant

## 2020-06-17 ENCOUNTER — Encounter: Payer: Self-pay | Admitting: Physician Assistant

## 2020-06-17 ENCOUNTER — Telehealth: Payer: Self-pay

## 2020-06-17 ENCOUNTER — Other Ambulatory Visit: Payer: Self-pay

## 2020-06-17 ENCOUNTER — Ambulatory Visit (INDEPENDENT_AMBULATORY_CARE_PROVIDER_SITE_OTHER): Payer: Medicare HMO

## 2020-06-17 VITALS — BP 131/89 | HR 81 | Ht 75.0 in | Wt 245.0 lb

## 2020-06-17 DIAGNOSIS — I4819 Other persistent atrial fibrillation: Secondary | ICD-10-CM | POA: Diagnosis not present

## 2020-06-17 DIAGNOSIS — I420 Dilated cardiomyopathy: Secondary | ICD-10-CM

## 2020-06-17 DIAGNOSIS — Z9889 Other specified postprocedural states: Secondary | ICD-10-CM

## 2020-06-17 DIAGNOSIS — I4821 Permanent atrial fibrillation: Secondary | ICD-10-CM

## 2020-06-17 DIAGNOSIS — Z7901 Long term (current) use of anticoagulants: Secondary | ICD-10-CM

## 2020-06-17 DIAGNOSIS — Z79899 Other long term (current) drug therapy: Secondary | ICD-10-CM | POA: Diagnosis not present

## 2020-06-17 DIAGNOSIS — I4892 Unspecified atrial flutter: Secondary | ICD-10-CM | POA: Diagnosis not present

## 2020-06-17 DIAGNOSIS — Z5181 Encounter for therapeutic drug level monitoring: Secondary | ICD-10-CM

## 2020-06-17 DIAGNOSIS — I10 Essential (primary) hypertension: Secondary | ICD-10-CM

## 2020-06-17 LAB — POCT INR: INR: 2.6 (ref 2.0–3.0)

## 2020-06-17 NOTE — Telephone Encounter (Signed)
  Patient Consent for Virtual Visit         [name] has provided verbal consent on 06/17/2020 for a virtual visit (video or telephone).   CONSENT FOR VIRTUAL VISIT FOR:  [name]  By participating in this virtual visit I agree to the following:  I hereby voluntarily request, consent and authorize [facility] and its employed or contracted physicians, physician assistants, nurse practitioners or other licensed health care professionals (the Practitioner), to provide me with telemedicine health care services (the "Services") as deemed necessary by the treating Practitioner. I acknowledge and consent to receive the Services by the Practitioner via telemedicine. I understand that the telemedicine visit will involve communicating with the Practitioner through live audiovisual communication technology and the disclosure of certain medical information by electronic transmission. I acknowledge that I have been given the opportunity to request an in-person assessment or other available alternative prior to the telemedicine visit and am voluntarily participating in the telemedicine visit.  I understand that I have the right to withhold or withdraw my consent to the use of telemedicine in the course of my care at any time, without affecting my right to future care or treatment, and that the Practitioner or I may terminate the telemedicine visit at any time. I understand that I have the right to inspect all information obtained and/or recorded in the course of the telemedicine visit and may receive copies of available information for a reasonable fee.  I understand that some of the potential risks of receiving the Services via telemedicine include:  Marland Kitchen Delay or interruption in medical evaluation due to technological equipment failure or disruption; . Information transmitted may not be sufficient (e.g. poor resolution of images) to allow for appropriate medical decision making by the Practitioner;  and/or  . In rare instances, security protocols could fail, causing a breach of personal health information.  Furthermore, I acknowledge that it is my responsibility to provide information about my medical history, conditions and care that is complete and accurate to the best of my ability. I acknowledge that Practitioner's advice, recommendations, and/or decision may be based on factors not within their control, such as incomplete or inaccurate data provided by me or distortions of diagnostic images or specimens that may result from electronic transmissions. I understand that the practice of medicine is not an exact science and that Practitioner makes no warranties or guarantees regarding treatment outcomes. I acknowledge that a copy of this consent can be made available to me via my patient portal ([place]), or I can request a printed copy by calling the office of [facility].    I understand that my insurance will be billed for this visit.   I have read or had this consent read to me. . I understand the contents of this consent, which adequately explains the benefits and risks of the Services being provided via telemedicine.  . I have been provided ample opportunity to ask questions regarding this consent and the Services and have had my questions answered to my satisfaction. . I give my informed consent for the services to be provided through the use of telemedicine in my medical care

## 2020-06-17 NOTE — Patient Instructions (Addendum)
Medication Instructions:   Complete current bottle of Entresto then STOP  START Losartan 50 mg daily, after completing current Entresto  STOP Torsemide (Demadex) today  START Lasix 40 mg daily on Tuesday 06/18/2020 *If you need a refill on your cardiac medications before your next appointment, please call your pharmacy*  Lab Work: Your physician recommends that you return for lab work in 5-7 days:   BMET  If you have labs (blood work) drawn today and your tests are completely normal, you will receive your results only by: Marland Kitchen MyChart Message (if you have MyChart) OR . A paper copy in the mail If you have any lab test that is abnormal or we need to change your treatment, we will call you to review the results.  Testing/Procedures: Your physician has requested that you have an echocardiogram. Echocardiography is a painless test that uses sound waves to create images of your heart. It provides your doctor with information about the size and shape of your heart and how well your heart's chambers and valves are working. This procedure takes approximately one hour. There are no restrictions for this procedure.   Please schedule prior to 6 month follow up    Follow-Up: At [facility], you and your health needs are our priority.  As part of our continuing mission to provide you with exceptional heart care, we have created designated Provider Care Teams.  These Care Teams include your primary Cardiologist (physician) and Advanced Practice Providers (APPs -  Physician Assistants and Nurse Practitioners) who all work together to provide you with the care you need, when you need it.  Your next appointment:   6 month(s)  The format for your next appointment:   In Person  Provider:   Quay Burow, MD  Other Instructions

## 2020-06-17 NOTE — Patient Instructions (Signed)
Continue taking 1 tablet daily.  [phone] INR check 3 weeks

## 2020-06-17 NOTE — Progress Notes (Signed)
Virtual Visit via Telephone Note   This visit type was conducted due to national recommendations for restrictions regarding the COVID-19 Pandemic (e.g. social distancing) in an effort to limit this patient's exposure and mitigate transmission in our community.  Due to his co-morbid illnesses, this patient is at least at moderate risk for complications without adequate follow up.  This format is felt to be most appropriate for this patient at this time.  The patient did not have access to video technology/had technical difficulties with video requiring transitioning to audio format only (telephone).  All issues noted in this document were discussed and addressed.  No physical exam could be performed with this format.  Please refer to the patient's chart for his  consent to telehealth for [facility]    Date:  06/19/2020   ID:  [name], DOB 06-Dec-1955, MRN [id number] The patient was identified using 2 identifiers.  Patient Location: Home Provider Location: Office/Clinic  PCP:  Douglas Limbo, MD  Cardiologist:  Douglas Burow, MD  Electrophysiologist:  None   Evaluation Performed:  Follow-Up Visit  Chief Complaint:  Follow up  History of Present Illness:    [name] is a 64 y.o. male with past medical history of hypertension, severe MR s/p minimally invasive mitral valve repair, chronic systolic heart failure, permanent atrial fibrillation s/p Maze procedure and left atrial appendage clipping, dilated aortic root and history of junctional bradycardia.  Echocardiogram obtained on 12/09/2018 showed EF 35 to 40%, severely dilated left and the right atrium, moderately reduced RV, possible flail segment of the posterior mitral valve leaflet, moderate MR.  He had PAF with a heart failure and underwent TEE DCCV in March 2020.  TEE at the time showed severe prolapse of P2 component of posterior mitral leaflet with moderate to severe MR.  Patient underwent minimally invasive mitral valve  repair in May 2020.  Post surgery, patient had transient junctional rhythm with long PR and briefly required temporary pacing.  He went back into A. fib prior to discharge and was placed on amiodarone therapy.  By the time he was followed up in the office, he has gone back to sinus rhythm.  Repeat echocardiogram obtained on 05/26/2019 showed EF 55 to 60%, stable mitral valve repair with mild mitral stenosis.  Patient had another cardioversion in August 2020.  He was previously on Coumadin, this was switched to Xarelto.  On the most follow-up in our A. fib clinic on 05/23/2020, he questioned about switching back to Coumadin due to cost.   Patient was contacted today via virtual visit.  He has already switched to Coumadin and waiting for repeat INR later today.  Otherwise he denies any recent chest pain or lower extremity edema, orthopnea or PND.  He wished to switch to [facility] due to cost, it cost him about $600 for prescription.  I will switch him to losartan 50 mg daily instead.  He also wished to see if there is alternative therapy for torsemide as he has not had good urine output recently.  It does not sound like he has any signs of heart failure despite having poor urine output.  I recommended switching to oral Lasix for 40 mg daily.  He will need a basic metabolic panel in 5 to 7 days.  Although diltiazem topical is listed on his medication list, he is only using that for anal fissure.  His rate control medications metoprolol 12.5 mg twice a day.  He will need a repeat echocardiogram  in the next 6 months prior to follow-up with Dr. Gwenlyn Ewing.   The patient does not have symptoms concerning for COVID-19 infection (fever, chills, cough, or new shortness of breath).    Past Medical History:  Diagnosis Date  . Acute on chronic systolic (congestive) heart failure ([place]) 12/30/2018  . Allergy   . Arthritis   . Atrial flutter with rapid ventricular response ([place])   . Chronic systolic (congestive) heart failure  ([place])   . Dilated aortic root ([place]) 01/04/2019  . Dilated cardiomyopathy ([place]) 01/04/2019  . GERD (gastroesophageal reflux disease)    history of  . Heart murmur   . History of colon polyps   . Hypertension   . Insomnia   . Mitral valve prolapse   . Mitral valve regurgitation   . Persistent atrial fibrillation ([place]) 12/02/2018  . S/P Maze operation for atrial fibrillation 03/21/2019   Complete bilateral atrial lesion set using cryothermy and bipolar radiofrequency ablation with clipping of LA appendage via right mini thoracotomy approach  . S/P minimally invasive mitral valve repair 03/21/2019   Complex valvuloplasty including triangular resection of posterior leaflet, artificial Gore-tex neochord placement x4 and 36 mm Sorin Memo 4D ring annuloplasty via right mini thoracotomy approach  . Seizures ([place])    had one approx. 30 yrs ago,has not had any since  . Severe mitral regurgitation    Past Surgical History:  Procedure Laterality Date  . ANKLE FUSION  left   Dec. 2012  . ARTHROSCOPY KNEE W/ DRILLING  bilateral   2012  . CARDIAC VALVE REPLACEMENT    . CARDIOVERSION N/A 01/03/2019   Procedure: CARDIOVERSION;  Surgeon: Sueanne Margarita, MD;  Location[facility] ENDOSCOPY;  Service: Cardiovascular;  Laterality: N/A;  . CARDIOVERSION N/A 06/23/2019   Procedure: CARDIOVERSION;  Surgeon: Fay Records, MD;  Location: [place];  Service: Cardiovascular;  Laterality: N/A;  . CLIPPING OF ATRIAL APPENDAGE  03/21/2019   Procedure: Clipping Of Atrial Appendage using 10mm Atricure Pro2 Clip;  Surgeon: Rexene Alberts, MD;  Location: [place];  Service: Open Heart Surgery;;  . COLONOSCOPY     x2  . FOOT ARTHRODESIS  06/23/2012   Procedure: ARTHRODESIS FOOT;  Surgeon: Wylene Simmer, MD;  Location: [place];  Service: Orthopedics;  Laterality: Left;  Left Subtalar and Talonavicular Joint Revision Arthrodesis  Aspiration of Bone Marrow from Left Hip   . HAIR TRANSPLANT    . HARDWARE REMOVAL  06/23/2012   Procedure:  HARDWARE REMOVAL;  Surgeon: Wylene Simmer, MD;  Location: [place];  Service: Orthopedics;  Laterality: Left;  Removal of Deep Implant  X's 3  . INSERTION OF MESH N/A 07/10/2016   Procedure: INSERTION OF MESH;  Surgeon: Coralie Keens, MD;  Location: [place];  Service: General;  Laterality: N/A;  . JOINT REPLACEMENT     right hip  01-2011  . LEFT HEART CATH AND CORONARY ANGIOGRAPHY N/A 03/16/2019   Procedure: LEFT HEART CATH AND CORONARY ANGIOGRAPHY;  Surgeon: Sherren Mocha, MD;  Location: [place] CV LAB;  Service: Cardiovascular;  Laterality: N/A;  . LIMB SPARING RESECTION HIP W/ SADDLE JOINT REPLACEMENT Right   . MINIMALLY INVASIVE MAZE PROCEDURE N/A 03/21/2019   Procedure: MINIMALLY INVASIVE MAZE PROCEDURE;  Surgeon: Rexene Alberts, MD;  Location: [place];  Service: Open Heart Surgery;  Laterality: N/A;  . MITRAL VALVE REPAIR Right 03/21/2019   Procedure: MINIMALLY INVASIVE MITRAL VALVE REPAIR (MVR) using Memo 4D ring size 36;  Surgeon: Rexene Alberts, MD;  Location: [facility];  Service: Open Heart Surgery;  Laterality: Right;  . TEE WITHOUT CARDIOVERSION N/A 01/03/2019   Procedure: TRANSESOPHAGEAL ECHOCARDIOGRAM (TEE);  Surgeon: Sueanne Margarita, MD;  Location: [facility] ENDOSCOPY;  Service: Cardiovascular;  Laterality: N/A;  . TEE WITHOUT CARDIOVERSION N/A 03/21/2019   Procedure: TRANSESOPHAGEAL ECHOCARDIOGRAM (TEE);  Surgeon: Rexene Alberts, MD;  Location: [place];  Service: Open Heart Surgery;  Laterality: N/A;  . TEMPORARY PACEMAKER N/A 03/22/2019   Procedure: TEMPORARY PACEMAKER;  Surgeon: Belva Crome, MD;  Location: [place] CV LAB;  Service: Cardiovascular;  Laterality: N/A;  . TOTAL KNEE ARTHROPLASTY Right 01/19/2014   Procedure: RIGHT TOTAL KNEE ARTHROPLASTY, Steroid injection left knee;  Surgeon: Mcarthur Rossetti, MD;  Location: [facility];  Service: Orthopedics;  Laterality: Right;  . TOTAL KNEE ARTHROPLASTY Left 10/23/2014   Procedure: LEFT TOTAL KNEE ARTHROPLASTY;   Surgeon: Mcarthur Rossetti, MD;  Location: [facility];  Service: Orthopedics;  Laterality: Left;  . UMBILICAL HERNIA REPAIR N/A 07/10/2016   Procedure: UMBILICAL HERNIA REPAIR;  Surgeon: Coralie Keens, MD;  Location: [place];  Service: General;  Laterality: N/A;     Current Meds  Medication Sig  . ALPRAZolam (XANAX) 0.25 MG tablet Take 0.25 mg by mouth at bedtime as needed for sleep.  Marland Kitchen aspirin EC 81 MG EC tablet Take 1 tablet (81 mg total) by mouth daily.  Marland Kitchen atorvastatin (LIPITOR) 20 MG tablet Take 20 mg by mouth daily.  Marland Kitchen diltiazem 2 % GEL Apply 1 application topically 3 (three) times daily. With 5 % lidocaine  . docusate sodium (COLACE) 100 MG capsule Take 300 mg by mouth daily.  Marland Kitchen HYDROcodone-acetaminophen (NORCO/VICODIN) 5-325 MG tablet Take 1 tablet by mouth every 6 (six) hours as needed for moderate pain.  . metoprolol tartrate (LOPRESSOR) 25 MG tablet Take 1/2 (one-half) tablet by mouth twice daily  . Multiple Vitamin (MULTIVITAMIN WITH MINERALS) TABS tablet Take 1 tablet by mouth daily.  . potassium chloride (KLOR-CON) 10 MEQ tablet TAKE 1  BY MOUTH TWICE DAILY  . spironolactone (ALDACTONE) 25 MG tablet TAKE 1 TABLET BY MOUTH ONCE DAILY . APPOINTMENT REQUIRED FOR FUTURE REFILLS  . warfarin (COUMADIN) 5 MG tablet Take 5 mg by mouth daily.  . [DISCONTINUED] ENTRESTO 24-26 MG Take 1 tablet by mouth twice daily  . [DISCONTINUED] torsemide (DEMADEX) 20 MG tablet TAKE 1 TABLET BY MOUTH ONCE DAILY . APPOINTMENT REQUIRED FOR FUTURE REFILLS     Allergies:   Patient has no known allergies.   Social History   Tobacco Use  . Smoking status: Never Smoker  . Smokeless tobacco: Never Used  Vaping Use  . Vaping Use: Never used  Substance Use Topics  . Alcohol use: Yes    Alcohol/week: 2.0 - 4.0 standard drinks    Types: 1 - 2 Standard drinks or equivalent, 1 - 2 Glasses of wine per week    Comment: 1 or 2 drink daily  . Drug use: No     Family Hx: The patient's  family history includes Colon cancer in his father; Diabetes in his mother; Hypertension in his father. There is no history of Stomach cancer, Esophageal cancer, Pancreatic cancer, or Rectal cancer.  ROS:   Please see the history of present illness.     All other systems reviewed and are negative.   Prior CV studies:   The following studies were reviewed today:  Echo 05/26/2019 1. The left ventricle has normal systolic function, with an ejection  fraction  of 55-60%. The cavity size was mildly dilated. Left ventricular  diastolic function could not be evaluated secondary to atrial  fibrillation. Elevated left ventricular  end-diastolic pressure.  2. The right ventricle has normal systolic function. The cavity was  mildly enlarged. There is no increase in right ventricular wall thickness.  Right ventricular systolic pressure could not be assessed.  3. S/P Mitral valve repair. Moderate thickening of the mitral valve  leaflet and calcification and thickening of chordae tendinae. Mild mitral  valve stenosis. MV Mean grad: 5.0 mmHg. MV Area (PHT): 1.54 cm. There is  no MR by colorflow doppler.  4. The aorta is abnormal in size and structure.  5. There is mild dilatation of the aortic root and of the ascending aorta  measuring 39 mm and 86mm respectively.  6. The aortic valve is tricuspid. Mild sclerosis of the aortic valve.  Aortic valve regurgitation was not assessed by color flow Doppler.  7. Left atrial size was mildly dilated.  8. Compared to prior echo, there is no MR and MV has been repaired.     Labs/Other Tests and Data Reviewed:    EKG:  An ECG dated 05/23/2020 was personally reviewed today and demonstrated:  Sinus rhythm, right bundle branch block, occasional PVCs  Recent Labs: 08/12/2019: BUN 28; Creatinine, Ser 1.12; Hemoglobin 15.9; Platelets 182; Potassium 4.2; Sodium 137   Recent Lipid Panel No results Ewing for: CHOL, TRIG, HDL, CHOLHDL, LDLCALC,  LDLDIRECT  Wt Readings from Last 3 Encounters:  06/17/20 245 lb (111.1 kg)  05/23/20 (!) 257 lb 3.2 oz (116.7 kg)  04/24/20 258 lb 12.8 oz (117.4 kg)     Objective:    Vital Signs:  BP 131/89   Pulse 81   Ht 6\' 3"  (1.905 m)   Wt 245 lb (111.1 kg)   BMI 30.62 kg/m    VITAL SIGNS:  reviewed  ASSESSMENT & PLAN:    1. History of mitral valve repair: We will need repeat echocardiogram prior to the next follow-up with Dr. Gwenlyn Ewing  2. Dilated cardiomyopathy: Although he had LV dysfunction with EF down to 35% in the past, this has improved to normal range by last echocardiogram.  He wished to come off of Entresto due to cost, I will switch him to losartan instead.  He also does not have good urinary output on the current torsemide 20 mg daily, I will switch him back to the previous Lasix 40 mg daily.  3. Hypertension: Blood pressure controlled on current therapy  4. Permanent atrial fibrillation: On Coumadin, rate controlled on metoprolol.  COVID-19 Education: The signs and symptoms of COVID-19 were discussed with the patient and how to seek care for testing (follow up with PCP or arrange E-visit).  The importance of social distancing was discussed today.  Time:   Today, I have spent 12 minutes with the patient with telehealth technology discussing the above problems.     Medication Adjustments/Labs and Tests Ordered: Current medicines are reviewed at length with the patient today.  Concerns regarding medicines are outlined above.   Tests Ordered: Orders Placed This Encounter  Procedures  . Basic metabolic panel  . ECHOCARDIOGRAM COMPLETE    Medication Changes: No orders of the defined types were placed in this encounter.   Follow Up:  In Person in 6 month(s)  Signed, Almyra Deforest, [place]  06/19/2020 11:12 PM    [place]

## 2020-06-19 ENCOUNTER — Other Ambulatory Visit: Payer: Self-pay | Admitting: Physician Assistant

## 2020-06-19 MED ORDER — LOSARTAN POTASSIUM 50 MG PO TABS
50.0000 mg | ORAL_TABLET | Freq: Every day | ORAL | 3 refills | Status: DC
Start: 2020-06-19 — End: 2021-06-09

## 2020-06-19 MED ORDER — FUROSEMIDE 40 MG PO TABS
40.0000 mg | ORAL_TABLET | Freq: Every day | ORAL | 3 refills | Status: DC
Start: 2020-06-19 — End: 2021-03-18

## 2020-06-19 NOTE — Telephone Encounter (Signed)
Rx has been sent to the pharmacy electronically. ° °

## 2020-06-19 NOTE — Telephone Encounter (Signed)
New Message   *STAT* If patient is at the pharmacy, call can be transferred to refill team.   1. Which medications need to be refilled? (please list name of each medication and dose if known)   Losartan 50 mg daily  Lasix 40 mg daily   2. Which pharmacy/location (including street and city if local pharmacy) is medication to be sent to? [place], [place]  3. Do they need a 30 day or 90 day supply? 90 day    Needs called in today, was supposed to start on yesterday, but medication was not called in after appt on 06/17/20.

## 2020-06-24 LAB — BASIC METABOLIC PANEL
BUN/Creatinine Ratio: 20 (ref 10–24)
BUN: 23 mg/dL (ref 8–27)
CO2: 23 mmol/L (ref 20–29)
Calcium: 8.9 mg/dL (ref 8.6–10.2)
Chloride: 104 mmol/L (ref 96–106)
Creatinine, Ser: 1.14 mg/dL (ref 0.76–1.27)
GFR calc Af Amer: 78 mL/min/{1.73_m2} (ref >59)
GFR calc non Af Amer: 67 mL/min/{1.73_m2} (ref >59)
Glucose: 94 mg/dL (ref 65–99)
Potassium: 4.3 mmol/L (ref 3.5–5.2)
Sodium: 141 mmol/L (ref 134–144)

## 2020-07-10 ENCOUNTER — Other Ambulatory Visit: Payer: Self-pay

## 2020-07-10 ENCOUNTER — Ambulatory Visit (INDEPENDENT_AMBULATORY_CARE_PROVIDER_SITE_OTHER): Payer: Medicare HMO

## 2020-07-10 DIAGNOSIS — Z5181 Encounter for therapeutic drug level monitoring: Secondary | ICD-10-CM | POA: Diagnosis not present

## 2020-07-10 DIAGNOSIS — I4892 Unspecified atrial flutter: Secondary | ICD-10-CM

## 2020-07-10 DIAGNOSIS — I4819 Other persistent atrial fibrillation: Secondary | ICD-10-CM

## 2020-07-10 DIAGNOSIS — Z7901 Long term (current) use of anticoagulants: Secondary | ICD-10-CM | POA: Diagnosis not present

## 2020-07-10 LAB — POCT INR: INR: 2.1 (ref 2.0–3.0)

## 2020-07-10 NOTE — Patient Instructions (Signed)
Continue taking 1 tablet daily.  [phone] INR check 6 weeks

## 2020-07-11 ENCOUNTER — Encounter: Payer: Self-pay | Admitting: Nurse Practitioner

## 2020-07-11 ENCOUNTER — Ambulatory Visit: Payer: Medicare HMO | Admitting: Nurse Practitioner

## 2020-07-11 VITALS — BP 124/70 | HR 75 | Ht 75.0 in | Wt 251.2 lb

## 2020-07-11 DIAGNOSIS — K601 Chronic anal fissure: Secondary | ICD-10-CM | POA: Diagnosis not present

## 2020-07-11 MED ORDER — DILTIAZEM GEL 2 %: 1.0000 "application " | Freq: Three times a day (TID) | CUTANEOUS | 0 refills | Status: DC

## 2020-07-11 NOTE — Progress Notes (Signed)
ASSESSMENT AND PLAN    # Intermittent rectal bleeding x 2 months on warfarin / recurrent posterior anal fissure --using Diltiazem ( only BID) since early early August.  --Fissure still present. Continue Diltiazem for 4 additional weeks. Use TID as directed.   --return to clinic for anal pain or ongoing bleeding.   --Denies constipation but bleeding started when patient began taking Hydrocodone. Recommend Benefiber once daily  HISTORY OF PRESENT ILLNESS     Primary Gastroenterologist :  Lucio Edward, MD  Chief Complaint :rectal bleeding   [name] is a 64 y.o. male with PMH / [place] significant for,  but not necessarily limited to: Diverticulosis, colon polyps, atrial fibrillation, mitral valve prolapse, severe mitral regurgitation, chronic combined systolic and diastolic CHF, hypertension, arthritis  Patient is here with recurrent intermittent rectal bleeding.  He was evaluated here April 2020 for anal pain with bleeding .  Found to have an posterior midline anal fissure at that time . Fissure apparently healed, he no further probHe did well with with topical diltiazem but now with recurrent rectal bleeding.  Bleeding began after starting hydrocodone two months ago though he denies associated constipation. He has no associated rectal or abdominal pain. Patient called here early August for refill on Diltiazem, he has since been using it only BID and may have stopped using it for a while. Feels like there has been improvement in the bleeding with Diltiazem.  Actually went several days without bleeding, almost cancelled appt but saw blood again today.     Previous Endoscopic Evaluations / Pertinent Studies:   November 2018 colonoscopy -One 6 mm polyp in the sigmoid colon, removed with a cold snare. Resected and retrieved. - One 5 mm polyp in the cecum, removed with a cold biopsy forceps. Resected and retrieved. - Moderate diverticulosis in the sigmoid colon, in the descending colon  and in the transverse colon. There was no evidence of diverticular bleeding. - Internal hemorrhoids. - The examination was otherwise normal on direct and retroflexion views.  Polyp pathology = hyperplastic polyps  Past Medical History:  Diagnosis Date  . Acute on chronic systolic (congestive) heart failure ([place]) 12/30/2018  . Allergy   . Arthritis   . Atrial flutter with rapid ventricular response ([place])   . Chronic systolic (congestive) heart failure ([place])   . Dilated aortic root ([place]) 01/04/2019  . Dilated cardiomyopathy ([place]) 01/04/2019  . GERD (gastroesophageal reflux disease)    history of  . Heart murmur   . History of colon polyps   . Hypertension   . Insomnia   . Mitral valve prolapse   . Mitral valve regurgitation   . Persistent atrial fibrillation ([place]) 12/02/2018  . S/P Maze operation for atrial fibrillation 03/21/2019   Complete bilateral atrial lesion set using cryothermy and bipolar radiofrequency ablation with clipping of LA appendage via right mini thoracotomy approach  . S/P minimally invasive mitral valve repair 03/21/2019   Complex valvuloplasty including triangular resection of posterior leaflet, artificial Gore-tex neochord placement x4 and 36 mm Sorin Memo 4D ring annuloplasty via right mini thoracotomy approach  . Seizures ([place])    had one approx. 30 yrs ago,has not had any since  . Severe mitral regurgitation     Current Medications, Allergies, Past Surgical History, Family History and Social History were reviewed in [employer] record.   Review of Systems: No chest pain. No shortness of breath. No urinary complaints.   PHYSICAL EXAM :  Wt Readings from Last 3 Encounters:  07/11/20 251 lb 3.2 oz (113.9 kg)  06/17/20 245 lb (111.1 kg)  05/23/20 (!) 257 lb 3.2 oz (116.7 kg)    BP 124/70 (BP Location: Left Arm, Patient Position: Sitting)   Pulse 75   Ht 6\' 3"  (1.905 m)   Wt 251 lb 3.2 oz (113.9 kg)   SpO2 98%   BMI 31.40 kg/m   Constitutional:  Pleasant male in no acute distress. Psychiatric: Normal mood and affect. Behavior is normal. Rectal: posterior mildline anal fissure, non-bleeding  Tye Savoy, NP  07/11/2020, 3:33 PM

## 2020-07-11 NOTE — Patient Instructions (Signed)
If you are age 65 or older, your body mass index should be between 23-30. Your Body mass index is 31.4 kg/m. If this is out of the aforementioned range listed, please consider follow up with your Primary Care Provider.  If you are age 69 or younger, your body mass index should be between 19-25. Your Body mass index is 31.4 kg/m. If this is out of the aformentioned range listed, please consider follow up with your Primary Care Provider.   Continue Diltiazem three times daily for 4 weeks.  Start Benefiber or Citrucel 2 teaspoons in 8 ounces of liquid daily.

## 2020-07-11 NOTE — Progress Notes (Signed)
Reviewed and agree with management plan.  Jamee Keach T. Jerick Khachatryan, MD FACG [facility]  

## 2020-07-16 ENCOUNTER — Other Ambulatory Visit: Payer: Self-pay

## 2020-07-16 ENCOUNTER — Ambulatory Visit (HOSPITAL_COMMUNITY): Payer: Medicare HMO | Attending: Cardiovascular Disease

## 2020-07-16 DIAGNOSIS — Z9889 Other specified postprocedural states: Secondary | ICD-10-CM | POA: Diagnosis not present

## 2020-07-16 DIAGNOSIS — I4891 Unspecified atrial fibrillation: Secondary | ICD-10-CM

## 2020-07-16 DIAGNOSIS — I10 Essential (primary) hypertension: Secondary | ICD-10-CM | POA: Diagnosis not present

## 2020-07-16 LAB — ECHOCARDIOGRAM COMPLETE
Area-P 1/2: 1.63 cm2
MV VTI: 2.12 cm2
S' Lateral: 3.9 cm

## 2020-07-19 NOTE — Progress Notes (Signed)
Normal pumping function, stable mitral valve repair

## 2020-08-21 ENCOUNTER — Ambulatory Visit (INDEPENDENT_AMBULATORY_CARE_PROVIDER_SITE_OTHER): Payer: Medicare HMO | Admitting: Pharmacist

## 2020-08-21 ENCOUNTER — Other Ambulatory Visit: Payer: Self-pay

## 2020-08-21 DIAGNOSIS — Z5181 Encounter for therapeutic drug level monitoring: Secondary | ICD-10-CM

## 2020-08-21 DIAGNOSIS — I4892 Unspecified atrial flutter: Secondary | ICD-10-CM

## 2020-08-21 DIAGNOSIS — I4819 Other persistent atrial fibrillation: Secondary | ICD-10-CM | POA: Diagnosis not present

## 2020-08-21 DIAGNOSIS — Z7901 Long term (current) use of anticoagulants: Secondary | ICD-10-CM

## 2020-08-21 LAB — POCT INR: INR: 1.8 — AB (ref 2.0–3.0)

## 2020-08-22 ENCOUNTER — Other Ambulatory Visit: Payer: Self-pay | Admitting: Cardiovascular Disease

## 2020-09-25 ENCOUNTER — Other Ambulatory Visit: Payer: Self-pay

## 2020-09-25 ENCOUNTER — Ambulatory Visit (INDEPENDENT_AMBULATORY_CARE_PROVIDER_SITE_OTHER): Payer: Medicare HMO

## 2020-09-25 DIAGNOSIS — Z5181 Encounter for therapeutic drug level monitoring: Secondary | ICD-10-CM | POA: Diagnosis not present

## 2020-09-25 DIAGNOSIS — I4892 Unspecified atrial flutter: Secondary | ICD-10-CM

## 2020-09-25 DIAGNOSIS — Z7901 Long term (current) use of anticoagulants: Secondary | ICD-10-CM | POA: Diagnosis not present

## 2020-09-25 DIAGNOSIS — I4819 Other persistent atrial fibrillation: Secondary | ICD-10-CM | POA: Diagnosis not present

## 2020-09-25 LAB — POCT INR: INR: 1.9 — AB (ref 2.0–3.0)

## 2020-09-25 NOTE — Patient Instructions (Signed)
Take 2 tonight and then Continue taking 1 tablet daily.  [phone] INR check 6 weeks

## 2020-10-15 ENCOUNTER — Other Ambulatory Visit: Payer: Self-pay | Admitting: General Practice

## 2020-11-06 ENCOUNTER — Ambulatory Visit (INDEPENDENT_AMBULATORY_CARE_PROVIDER_SITE_OTHER): Payer: Medicare HMO

## 2020-11-06 ENCOUNTER — Other Ambulatory Visit: Payer: Self-pay

## 2020-11-06 DIAGNOSIS — I4819 Other persistent atrial fibrillation: Secondary | ICD-10-CM | POA: Diagnosis not present

## 2020-11-06 DIAGNOSIS — Z5181 Encounter for therapeutic drug level monitoring: Secondary | ICD-10-CM | POA: Diagnosis not present

## 2020-11-06 DIAGNOSIS — Z7901 Long term (current) use of anticoagulants: Secondary | ICD-10-CM

## 2020-11-06 DIAGNOSIS — I4892 Unspecified atrial flutter: Secondary | ICD-10-CM | POA: Diagnosis not present

## 2020-11-06 LAB — POCT INR: INR: 1.9 — AB (ref 2.0–3.0)

## 2020-11-06 NOTE — Patient Instructions (Signed)
Take 2 tonight and then increase to 1 tablet daily except 1.5 tablets on Wednesday.  [phone] INR check 5 weeks

## 2020-11-11 ENCOUNTER — Other Ambulatory Visit: Payer: Self-pay | Admitting: General Practice

## 2020-12-04 ENCOUNTER — Other Ambulatory Visit (HOSPITAL_COMMUNITY): Payer: Medicare HMO

## 2020-12-13 ENCOUNTER — Ambulatory Visit: Payer: Medicare HMO | Admitting: Cardiovascular Disease

## 2020-12-13 ENCOUNTER — Encounter: Payer: Self-pay | Admitting: Cardiovascular Disease

## 2020-12-13 ENCOUNTER — Ambulatory Visit (INDEPENDENT_AMBULATORY_CARE_PROVIDER_SITE_OTHER): Payer: Medicare HMO

## 2020-12-13 ENCOUNTER — Other Ambulatory Visit: Payer: Self-pay

## 2020-12-13 VITALS — BP 150/81 | HR 89 | Ht 75.0 in | Wt 241.0 lb

## 2020-12-13 DIAGNOSIS — I340 Nonrheumatic mitral (valve) insufficiency: Secondary | ICD-10-CM | POA: Diagnosis not present

## 2020-12-13 DIAGNOSIS — I4892 Unspecified atrial flutter: Secondary | ICD-10-CM | POA: Diagnosis not present

## 2020-12-13 DIAGNOSIS — E785 Hyperlipidemia, unspecified: Secondary | ICD-10-CM | POA: Insufficient documentation

## 2020-12-13 DIAGNOSIS — I10 Essential (primary) hypertension: Secondary | ICD-10-CM | POA: Diagnosis not present

## 2020-12-13 DIAGNOSIS — Z5181 Encounter for therapeutic drug level monitoring: Secondary | ICD-10-CM | POA: Diagnosis not present

## 2020-12-13 DIAGNOSIS — Z9889 Other specified postprocedural states: Secondary | ICD-10-CM | POA: Diagnosis not present

## 2020-12-13 DIAGNOSIS — I4819 Other persistent atrial fibrillation: Secondary | ICD-10-CM

## 2020-12-13 DIAGNOSIS — E782 Mixed hyperlipidemia: Secondary | ICD-10-CM

## 2020-12-13 DIAGNOSIS — Z7901 Long term (current) use of anticoagulants: Secondary | ICD-10-CM

## 2020-12-13 LAB — POCT INR: INR: 2.3 (ref 2.0–3.0)

## 2020-12-13 NOTE — Progress Notes (Signed)
12/13/20[name]   25-Jun-1956  [id number]  Primary Physician [name], MD Primary Cardiologist: Lorretta Harp MD Lupe Carney, [place]  HPI:  [name] is a 65 y.o.  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 He was referred by Dr. Coletta Memos for cardiovascular evaluation because of hypertension and mitral regurgitation.I last saw him for a virtual telemedicine video visit 06/28/2019. His only cardiac risk factor is treated hypertension. He does not smoke. He's never had a heart attack or stroke. He is on 3 antihypertensive medications and is aware of some restriction. His last 2-D echocardiogram performed 11/25/12 revealed mildly dilated left ventricle with normal systolic function, moderate to severe MR and severe left atrial enlargement. He remains in sinus rhythm and otherwise is asymptomatic.  When I saw him in the office last he was in A. fib with RVRHe does complain of some increasing shortness of breath as well.He appeared to be volume overloaded, and and heart failure. I arrange for him to be admitted to the hospital where he was diuresed and underwent TEE guided DC cardioversion back to sinus rhythm. He is on Eliquis. He was seen by Dr. Alberteen Sam agreed that he needed mitral valve repair. His EF was 35 to 40% with severe MR and a flail P2 segment of the posterior leaflet with a ruptured chordae. He is lost from 287 down to 241 pounds. He is in sinus rhythm clinically and is improved.  He ultimately underwent cardiac catheterization by Dr. Burt Knack 03/16/2019 revealing normal coronary arteries.  5 days later on 03/21/2019 he underwent minimally invasive mitral valve repair along with a surgical Maze procedure and left atrial clipping.  He was discharged home on 03/28/2019 on Coumadin anticoagulation.  He was seen by Kerin Ransom in the office 05/29/2019 and was found to be in atrial  fibrillation.  He was on amiodarone.  He underwent outpatient cardioversion successfully on 06/22/2019 by Dr. Dorris Carnes.  A 2D echo performed 05/26/2019 revealed improvement in his EF to normal with no evidence of mitral regurgitation.  Since I saw him a year and a half ago he continues to do well.  His major complaint is of back pain.  He is starting to swim for exercise.  He denies chest pain or shortness of breath.  His most recent 2D echocardiogram performed 07/16/2020 revealed normal LV systolic function with no evidence of mitral regurgitation.  He remains on warfarin anticoagulation with an INR today of 2.3 maintaining sinus rhythm.  Current Meds  Medication Sig  . ALPRAZolam (XANAX) 0.25 MG tablet Take 0.25 mg by mouth at bedtime as needed for sleep.  Marland Kitchen aspirin EC 81 MG EC tablet Take 1 tablet (81 mg total) by mouth daily.  Marland Kitchen atorvastatin (LIPITOR) 20 MG tablet Take 20 mg by mouth daily.  Marland Kitchen diltiazem 2 % GEL Apply 1 application topically 3 (three) times daily. With 5 % lidocaine  . docusate sodium (COLACE) 100 MG capsule Take 300 mg by mouth daily.  Marland Kitchen HYDROcodone-acetaminophen (NORCO/VICODIN) 5-325 MG tablet Take 1 tablet by mouth every 6 (six) hours as needed for moderate pain.  . metoprolol tartrate (LOPRESSOR) 25 MG tablet Take 1/2 (one-half) tablet by mouth twice daily  . Multiple Vitamin (MULTIVITAMIN WITH MINERALS) TABS tablet Take 1 tablet by mouth daily.  . potassium chloride (KLOR-CON) 10 MEQ tablet TAKE 1  BY MOUTH TWICE DAILY  . spironolactone (ALDACTONE) 25 MG  tablet TAKE 1 TABLET BY MOUTH ONCE DAILY . APPOINTMENT REQUIRED FOR FUTURE REFILLS  . warfarin (COUMADIN) 5 MG tablet TAKE 1 TABLET BY MOUTH ONCE DAILY AS DIRECTED BY  COUMADIN  CLINIC     No Known Allergies  Social History   Socioeconomic History  . Marital status: Married    Spouse name: Not on file  . Number of children: 1  . Years of education: Not on file  . Highest education level: Not on file  Occupational  History  . Occupation: Pension scheme manager  Tobacco Use  . Smoking status: Never Smoker  . Smokeless tobacco: Never Used  Vaping Use  . Vaping Use: Never used  Substance and Sexual Activity  . Alcohol use: Yes    Alcohol/week: 2.0 - 4.0 standard drinks    Types: 1 - 2 Standard drinks or equivalent, 1 - 2 Glasses of wine per week    Comment: 1 or 2 drink daily  . Drug use: No  . Sexual activity: Yes  Other Topics Concern  . Not on file  Social History Narrative  . Not on file   Social Determinants of Health   Financial Resource Strain: Not on file  Food Insecurity: Not on file  Transportation Needs: Not on file  Physical Activity: Not on file  Stress: Not on file  Social Connections: Not on file  Intimate Partner Violence: Not on file     Review of Systems: General: negative for chills, fever, night sweats or weight changes.  Cardiovascular: negative for chest pain, dyspnea on exertion, edema, orthopnea, palpitations, paroxysmal nocturnal dyspnea or shortness of breath Dermatological: negative for rash Respiratory: negative for cough or wheezing Urologic: negative for hematuria Abdominal: negative for nausea, vomiting, diarrhea, bright red blood per rectum, melena, or hematemesis Neurologic: negative for visual changes, syncope, or dizziness All other systems reviewed and are otherwise negative except as noted above.    Blood pressure (!) 150/81, pulse 89, height 6\' 3"  (1.905 m), weight 241 lb (109.3 kg), SpO2 95 %.  General appearance: alert and no distress Neck: no adenopathy, no carotid bruit, no JVD, supple, symmetrical, trachea midline and thyroid not enlarged, symmetric, no tenderness/mass/nodules Lungs: clear to auscultation bilaterally Heart: regular rate and rhythm, S1, S2 normal, no murmur, click, rub or gallop Extremities: extremities normal, atraumatic, no cyanosis or edema Pulses: 2+ and symmetric Skin: Skin color, texture, turgor normal. No rashes or  lesions Neurologic: Alert and oriented X 3, normal strength and tone. Normal symmetric reflexes. Normal coordination and gait  EKG sinus rhythm 89 with right bundle branch block and left anterior fascicular block (bifascicular block).  I personally reviewed this EKG.  ASSESSMENT AND PLAN:   Essential hypertension History of essential hypertension blood pressure measured today 150/81.  He is on metoprolol losartan and Aldactone.  Severe mitral regurgitation Suggested had severe MR and underwent minimally invasive mitral valve repair by Dr. Roxy Manns 03/21/2019 with left atrial Maze procedure and left atrial appendage clipping.  He was in the hospital for total of 1 week and was back to work 2 weeks after discharge.  His most recent 2D echo performed 07/16/2020 revealed normal LV systolic function with no evidence of MR.  He is otherwise asymptomatic.  Persistent atrial fibrillation (HCC) Atrial fibrillation history of atrial fibrillation in the past status post surgical left atrial Maze procedure maintaining sinus rhythm on warfarin oral anticoagulation.  Hyperlipidemia History of hyperlipidemia on statin therapy with recent lipid profile performed by his PCP revealing total cholesterol  of 190, LDL of 93 and HDL of 83.      Lorretta Harp MD FACP,FACC,FAHA, [facility] 12/13/2020 1:47 PM

## 2020-12-13 NOTE — Assessment & Plan Note (Signed)
Suggested had severe MR and underwent minimally invasive mitral valve repair by Dr. Roxy Manns 03/21/2019 with left atrial Maze procedure and left atrial appendage clipping.  He was in the hospital for total of 1 week and was back to work 2 weeks after discharge.  His most recent 2D echo performed 07/16/2020 revealed normal LV systolic function with no evidence of MR.  He is otherwise asymptomatic.

## 2020-12-13 NOTE — Assessment & Plan Note (Signed)
History of essential hypertension blood pressure measured today 150/81.  He is on metoprolol losartan and Aldactone.

## 2020-12-13 NOTE — Patient Instructions (Signed)
Continue taking 1 tablet daily except 1.5 tablets on Wednesday.  [phone] INR check 8 weeks

## 2020-12-13 NOTE — Assessment & Plan Note (Signed)
History of hyperlipidemia on statin therapy with recent lipid profile performed by his PCP revealing total cholesterol of 190, LDL of 93 and HDL of 83.

## 2020-12-13 NOTE — Assessment & Plan Note (Signed)
Atrial fibrillation history of atrial fibrillation in the past status post surgical left atrial Maze procedure maintaining sinus rhythm on warfarin oral anticoagulation.

## 2020-12-13 NOTE — Patient Instructions (Signed)
Medication Instructions:  Your physician recommends that you continue on your current medications as directed. Please refer to the Current Medication list given to you today.  *If you need a refill on your cardiac medications before your next appointment, please call your pharmacy*  Testing/Procedures: Your physician has requested that you have an echocardiogram in September 2022. Echocardiography is a painless test that uses sound waves to create images of your heart. It provides your doctor with information about the size and shape of your heart and how well your heart's chambers and valves are working. This procedure takes approximately one hour. There are no restrictions for this procedure.  Follow-Up: At [facility], you and your health needs are our priority.  As part of our continuing mission to provide you with exceptional heart care, we have created designated Provider Care Teams.  These Care Teams include your primary Cardiologist (physician) and Advanced Practice Providers (APPs -  Physician Assistants and Nurse Practitioners) who all work together to provide you with the care you need, when you need it.  We recommend signing up for the patient portal called "MyChart".  Sign up information is provided on this After Visit Summary.  MyChart is used to connect with patients for Virtual Visits (Telemedicine).  Patients are able to view lab/test results, encounter notes, upcoming appointments, etc.  Non-urgent messages can be sent to your provider as well.   To learn more about what you can do with MyChart, go to [email or web address].    Your next appointment:   12 month(s)  The format for your next appointment:   In Person  Provider:   Dr. Gwenlyn Found

## 2021-01-22 ENCOUNTER — Telehealth: Payer: Self-pay | Admitting: Orthopaedic Surgery

## 2021-01-22 NOTE — Telephone Encounter (Signed)
Pt called stating he had an upcoming dentist visit and because he had a knee replacement, he will need some antibiotics. Pt would like to have at least 24 tablets so that when he has another dentist appt he won't have to wait to have them called in.

## 2021-01-22 NOTE — Telephone Encounter (Signed)
Patient aware he does not have to premed anymore

## 2021-02-07 ENCOUNTER — Other Ambulatory Visit: Payer: Self-pay

## 2021-02-07 ENCOUNTER — Ambulatory Visit (INDEPENDENT_AMBULATORY_CARE_PROVIDER_SITE_OTHER): Payer: Medicare HMO

## 2021-02-07 DIAGNOSIS — Z7901 Long term (current) use of anticoagulants: Secondary | ICD-10-CM | POA: Diagnosis not present

## 2021-02-07 DIAGNOSIS — I4892 Unspecified atrial flutter: Secondary | ICD-10-CM

## 2021-02-07 DIAGNOSIS — Z5181 Encounter for therapeutic drug level monitoring: Secondary | ICD-10-CM | POA: Diagnosis not present

## 2021-02-07 DIAGNOSIS — I4819 Other persistent atrial fibrillation: Secondary | ICD-10-CM

## 2021-02-07 LAB — POCT INR: INR: 1.9 — AB (ref 2.0–3.0)

## 2021-02-07 NOTE — Patient Instructions (Addendum)
Take 2 tablets today only and then Continue taking 1 tablet daily except 1.5 tablets on Wednesday.  [phone] INR check 8 weeks

## 2021-02-10 ENCOUNTER — Other Ambulatory Visit: Payer: Self-pay | Admitting: General Practice

## 2021-03-05 ENCOUNTER — Other Ambulatory Visit: Payer: Self-pay | Admitting: Cardiovascular Disease

## 2021-03-17 ENCOUNTER — Other Ambulatory Visit: Payer: Self-pay | Admitting: Cardiovascular Disease

## 2021-03-18 ENCOUNTER — Telehealth: Payer: Self-pay | Admitting: Cardiovascular Disease

## 2021-03-18 ENCOUNTER — Telehealth: Payer: Self-pay

## 2021-03-18 MED ORDER — TORSEMIDE 20 MG PO TABS: 20.0000 mg | ORAL_TABLET | Freq: Every day | ORAL | 3 refills | Status: DC

## 2021-03-18 NOTE — Telephone Encounter (Signed)
Phone encounter opened by mistake.  

## 2021-03-18 NOTE — Telephone Encounter (Signed)
Spoke with pt regarding torsemide 20mg  prescription.  Per chart it appears that Dr. Coletta Memos prescribed torsemide 20mg  once daily for pt. Dr. Gwenlyn Found is ok to refill this medication. Confirmed pharmacy with pt. Prescription sent in. Pt verbalizes understanding.

## 2021-03-18 NOTE — Addendum Note (Signed)
Addended by: Beatrix Fetters on: 03/18/2021 01:31 PM   Modules accepted: Orders

## 2021-03-18 NOTE — Telephone Encounter (Signed)
Patient would like to know if Dr. Gwenlyn Found wants him to continue taking Torsemide 20 MG. If so, he needs to have it refilled.   *STAT* If patient is at the pharmacy, call can be transferred to refill team.   1. Which medications need to be refilled? (please list name of each medication and dose if known)  Torsemide 20 MG  2. Which pharmacy/location (including street and city if local pharmacy) is medication to be sent to? [place], [place]  3. Do they need a 30 day or 90 day supply? 90 day supply

## 2021-03-18 NOTE — Telephone Encounter (Signed)
Called patient [name] to confirm that he is taking Torsemide 20 mg daily. I informed patient that I see Furosemide (Lasix) 40 mg daily on his medication list in our system. Patient stated that he did try the Lasix and it did not work that he has been taking Torsemide 20 mg daily and needs a refill. Informed patient that I will send a message to Dr. Gwenlyn Found to make sure it is okay for him to take the Torsemide 20 mg daily.

## 2021-04-09 ENCOUNTER — Ambulatory Visit (INDEPENDENT_AMBULATORY_CARE_PROVIDER_SITE_OTHER): Payer: Medicare HMO

## 2021-04-09 ENCOUNTER — Other Ambulatory Visit: Payer: Self-pay

## 2021-04-09 DIAGNOSIS — Z7901 Long term (current) use of anticoagulants: Secondary | ICD-10-CM | POA: Diagnosis not present

## 2021-04-09 DIAGNOSIS — I4819 Other persistent atrial fibrillation: Secondary | ICD-10-CM

## 2021-04-09 DIAGNOSIS — Z5181 Encounter for therapeutic drug level monitoring: Secondary | ICD-10-CM

## 2021-04-09 DIAGNOSIS — I4892 Unspecified atrial flutter: Secondary | ICD-10-CM

## 2021-04-09 LAB — POCT INR: INR: 2.3 (ref 2.0–3.0)

## 2021-04-09 NOTE — Patient Instructions (Signed)
Continue taking 1 tablet daily except 1.5 tablets on Wednesday.  [phone] INR check 8 weeks

## 2021-04-14 ENCOUNTER — Telehealth: Payer: Self-pay

## 2021-04-14 NOTE — Telephone Encounter (Signed)
    [name] DOB:  [birth year]  MRN:  [id number]   Primary Cardiologist: Quay Burow, MD  Chart reviewed as part of pre-operative protocol coverage. Given past medical history and time since last visit, based on ACC/AHA guidelines, [name] would be at acceptable risk for the planned procedure without further cardiovascular testing.   Patient with diagnosis of A Fib on warfarin for anticoagulation.     Procedure:  L5-S1 FACET INJECTION  Date of procedure: 05/12/21   CHA2DS2-VASc Score = 3  This indicates a 3.2% annual risk of stroke. The patient's score is based upon: CHF History: Yes HTN History: Yes Diabetes History: No Stroke History: No Vascular Disease History: No   CrCl 84 mL/min Platelet count 170K   Per office protocol, patient can hold warfarin for 5 days prior to procedure.     Patient will not need bridging with Lovenox (enoxaparin) around procedure.   If not bridging, patient should restart warfarin on the evening of procedure or day after, at discretion of procedure MD  I will route this recommendation to the requesting party via [place] fax function and remove from pre-op pool.  Please call with questions.  Kathyrn Drown, NP 04/14/2021, 4:10 PM

## 2021-04-14 NOTE — Telephone Encounter (Signed)
   [facility] Pre-operative Risk Assessment    Patient Name: [name]  DOB: 20-Jun-1956  MRN: [id number]   Request for surgical clearance:  What type of surgery is being performed? L5-S1 FACET INJECTION   When is this surgery scheduled? 05-12-2021   What type of clearance is required (medical clearance vs. Pharmacy clearance to hold med vs. Both)? MEDICATION  Are there any medications that need to be held prior to surgery and how long?WARFARIN 5 DAYS PRIOR AND WILL RESTART 05-13-2021   Practice name and name of physician performing surgery? [place]   What is the office phone number? [phone]   7.   What is the office fax number? [phone]  8.   Anesthesia type (None, local, MAC, general) ? NOT LISTED

## 2021-04-14 NOTE — Telephone Encounter (Signed)
Patient with diagnosis of A Fib on warfarin for anticoagulation.    Procedure:  L5-S1 FACET INJECTION  Date of procedure: 05/12/21   CHA2DS2-VASc Score = 3  This indicates a 3.2% annual risk of stroke. The patient's score is based upon: CHF History: Yes HTN History: Yes Diabetes History: No Stroke History: No Vascular Disease History: No   CrCl 84 mL/min Platelet count 170K  Per office protocol, patient can hold warfarin for 5 days prior to procedure.    Patient will not need bridging with Lovenox (enoxaparin) around procedure.  If not bridging, patient should restart warfarin on the evening of procedure or day after, at discretion of procedure MD

## 2021-04-14 NOTE — Telephone Encounter (Signed)
I spoke to the patient and discussed Warfarin Hold 5 days prior to 7/11 Injection.    He will have INR check 7/18 @ 2:00pm.  Patient verbalized understanding.

## 2021-05-19 ENCOUNTER — Ambulatory Visit (INDEPENDENT_AMBULATORY_CARE_PROVIDER_SITE_OTHER): Payer: Medicare HMO

## 2021-05-19 ENCOUNTER — Other Ambulatory Visit: Payer: Self-pay

## 2021-05-19 DIAGNOSIS — I4819 Other persistent atrial fibrillation: Secondary | ICD-10-CM

## 2021-05-19 DIAGNOSIS — Z7901 Long term (current) use of anticoagulants: Secondary | ICD-10-CM | POA: Diagnosis not present

## 2021-05-19 DIAGNOSIS — Z5181 Encounter for therapeutic drug level monitoring: Secondary | ICD-10-CM | POA: Diagnosis not present

## 2021-05-19 DIAGNOSIS — I4892 Unspecified atrial flutter: Secondary | ICD-10-CM | POA: Diagnosis not present

## 2021-05-19 LAB — POCT INR: INR: 2.8 (ref 2.0–3.0)

## 2021-05-19 NOTE — Patient Instructions (Signed)
Continue taking 1 tablet daily except 1.5 tablets on Wednesday.  [phone] INR check 8 weeks

## 2021-06-09 ENCOUNTER — Other Ambulatory Visit: Payer: Self-pay | Admitting: Physician Assistant

## 2021-06-12 ENCOUNTER — Other Ambulatory Visit: Payer: Self-pay | Admitting: Cardiovascular Disease

## 2021-06-12 NOTE — Telephone Encounter (Signed)
*  STAT* If patient is at the pharmacy, call can be transferred to refill team.   1. Which medications need to be refilled? (please list name of each medication and dose if known) Potassium Chloride Micro ER 10 meq  2. Which pharmacy/location (including street and city if local pharmacy) is medication to be sent to? [facility], [place]- Fax is not working  3. Do they need a 30 day or 90 day supply? #180 and refills

## 2021-07-04 ENCOUNTER — Ambulatory Visit (HOSPITAL_COMMUNITY): Payer: Medicare HMO | Attending: Cardiology

## 2021-07-08 ENCOUNTER — Encounter (HOSPITAL_COMMUNITY): Payer: Self-pay | Admitting: Cardiovascular Disease

## 2021-07-14 ENCOUNTER — Telehealth: Payer: Self-pay

## 2021-07-14 NOTE — Telephone Encounter (Signed)
Lmom to r/s

## 2021-07-18 ENCOUNTER — Encounter (HOSPITAL_COMMUNITY): Payer: Self-pay | Admitting: Emergency Medicine

## 2021-07-18 ENCOUNTER — Emergency Department (HOSPITAL_COMMUNITY): Payer: Medicare HMO

## 2021-07-18 ENCOUNTER — Emergency Department (HOSPITAL_COMMUNITY)
Admission: EM | Admit: 2021-07-18 | Discharge: 2021-07-18 | Disposition: A | Discharge status: Home / Self Care | Payer: Medicare HMO | Attending: Emergency Medicine | Admitting: Emergency Medicine

## 2021-07-18 ENCOUNTER — Other Ambulatory Visit: Payer: Self-pay

## 2021-07-18 DIAGNOSIS — Z96653 Presence of artificial knee joint, bilateral: Secondary | ICD-10-CM | POA: Insufficient documentation

## 2021-07-18 DIAGNOSIS — Z7901 Long term (current) use of anticoagulants: Secondary | ICD-10-CM | POA: Insufficient documentation

## 2021-07-18 DIAGNOSIS — I110 Hypertensive heart disease with heart failure: Secondary | ICD-10-CM | POA: Insufficient documentation

## 2021-07-18 DIAGNOSIS — I4891 Unspecified atrial fibrillation: Secondary | ICD-10-CM | POA: Diagnosis not present

## 2021-07-18 DIAGNOSIS — I5023 Acute on chronic systolic (congestive) heart failure: Secondary | ICD-10-CM | POA: Diagnosis not present

## 2021-07-18 DIAGNOSIS — I4892 Unspecified atrial flutter: Secondary | ICD-10-CM | POA: Insufficient documentation

## 2021-07-18 DIAGNOSIS — Z7982 Long term (current) use of aspirin: Secondary | ICD-10-CM | POA: Insufficient documentation

## 2021-07-18 DIAGNOSIS — S0083XA Contusion of other part of head, initial encounter: Secondary | ICD-10-CM | POA: Insufficient documentation

## 2021-07-18 DIAGNOSIS — W01198A Fall on same level from slipping, tripping and stumbling with subsequent striking against other object, initial encounter: Secondary | ICD-10-CM | POA: Insufficient documentation

## 2021-07-18 DIAGNOSIS — Z955 Presence of coronary angioplasty implant and graft: Secondary | ICD-10-CM | POA: Insufficient documentation

## 2021-07-18 DIAGNOSIS — S0990XA Unspecified injury of head, initial encounter: Secondary | ICD-10-CM | POA: Diagnosis present

## 2021-07-18 DIAGNOSIS — Z79899 Other long term (current) drug therapy: Secondary | ICD-10-CM | POA: Insufficient documentation

## 2021-07-18 NOTE — ED Triage Notes (Signed)
Patient tripped on some equipment in his grage, hit his head and L elbow, has a golf-ball sized hematoma with minor abrasion above L eyebrow. Denies LOC, dizziness, HA or vision changes. Pt on warfarin.

## 2021-07-18 NOTE — ED Provider Notes (Signed)
[facility] DEPT Provider Note   CSN: [id number] Arrival date & time: 07/18/21  1212     History Chief Complaint  Patient presents with   Vanessa Ralphs is a 65 y.o. male.  65 year old male presents with hematoma to left forehead after mechanical fall just prior to arrival.  No LOC.  Denies any neck pain.  No confusion.  Is on Coumadin for history of cardiac disease.  Has no other complaints at this time      Past Medical History:  Diagnosis Date   Acute on chronic systolic (congestive) heart failure (HCC) 12/30/2018   Allergy    Arthritis    Atrial flutter with rapid ventricular response (HCC)    Chronic systolic (congestive) heart failure (HCC)    Dilated aortic root ([place]) 01/04/2019   Dilated cardiomyopathy ([place]) 01/04/2019   GERD (gastroesophageal reflux disease)    history of   Heart murmur    History of colon polyps    Hypertension    Insomnia    Mitral valve prolapse    Mitral valve regurgitation    Persistent atrial fibrillation ([place]) 12/02/2018   S/P Maze operation for atrial fibrillation 03/21/2019   Complete bilateral atrial lesion set using cryothermy and bipolar radiofrequency ablation with clipping of LA appendage via right mini thoracotomy approach   S/P minimally invasive mitral valve repair 03/21/2019   Complex valvuloplasty including triangular resection of posterior leaflet, artificial Gore-tex neochord placement x4 and 36 mm Sorin Memo 4D ring annuloplasty via right mini thoracotomy approach   Seizures ([place])    had one approx. 30 yrs ago,has not had any since   Severe mitral regurgitation     Patient Active Problem List   Diagnosis Date Noted   Hyperlipidemia 12/13/2020   Atrial fibrillation ([place]) 06/10/2020   Long term (current) use of anticoagulants 06/10/2020   Secondary hypercoagulable state ([place]) 04/24/2020   AV junctional bradycardia 03/22/2019   S/P minimally invasive mitral valve repair  03/21/2019   S/P  Maze operation for atrial fibrillation 03/21/2019   Dilated aortic root ([place]) 01/04/2019   Dilated cardiomyopathy ([place]) 01/04/2019   Atrial flutter with rapid ventricular response ([place])    Mitral valve prolapse    Hypertensive urgency 12/30/2018   Acute on chronic systolic (congestive) heart failure ([place]) Q000111Q   Acute systolic heart failure ([place]) Q000111Q   Chronic systolic (congestive) heart failure (HCC)    Persistent atrial fibrillation ([place]) 12/02/2018   Right hamstring muscle strain 07/20/2018   Essential hypertension 02/07/2016   Severe mitral regurgitation    Annual physical exam 11/16/2014   Arthritis of left knee 10/23/2014   Status post total left knee replacement 10/23/2014   Arthritis of knee, right 01/19/2014   Status post total knee replacement 01/19/2014   Benign paroxysmal positional vertigo 10/30/2013   Testicular hypofunction 03/24/2012    Past Surgical History:  Procedure Laterality Date   ANKLE FUSION  left   Dec. 2012   ARTHROSCOPY KNEE W/ DRILLING  bilateral   2012   CARDIAC VALVE REPLACEMENT     CARDIOVERSION N/A 01/03/2019   Procedure: CARDIOVERSION;  Surgeon: Sueanne Margarita, MD;  Location: [facility];  Service: Cardiovascular;  Laterality: N/A;   CARDIOVERSION N/A 06/23/2019   Procedure: CARDIOVERSION;  Surgeon: Fay Records, MD;  Location: [facility];  Service: Cardiovascular;  Laterality: N/A;   CLIPPING OF ATRIAL APPENDAGE  03/21/2019   Procedure: Clipping Of Atrial Appendage using 54m Atricure Pro2 Clip;  Surgeon: Rexene Alberts, MD;  Location: [facility] OR;  Service: Open Heart Surgery;;   COLONOSCOPY     x2   FOOT ARTHRODESIS  06/23/2012   Procedure: ARTHRODESIS FOOT;  Surgeon: Wylene Simmer, MD;  Location: [facility];  Service: Orthopedics;  Laterality: Left;  Left Subtalar and Talonavicular Joint Revision Arthrodesis  Aspiration of Bone Marrow from Left Hip    HAIR TRANSPLANT     HARDWARE REMOVAL  06/23/2012   Procedure: HARDWARE REMOVAL;   Surgeon: Wylene Simmer, MD;  Location: [facility];  Service: Orthopedics;  Laterality: Left;  Removal of Deep Implant  X's 3   INSERTION OF MESH N/A 07/10/2016   Procedure: INSERTION OF MESH;  Surgeon: Coralie Keens, MD;  Location: [facility];  Service: General;  Laterality: N/A;   JOINT REPLACEMENT     right hip  01-2011   LEFT HEART CATH AND CORONARY ANGIOGRAPHY N/A 03/16/2019   Procedure: LEFT HEART CATH AND CORONARY ANGIOGRAPHY;  Surgeon: Sherren Mocha, MD;  Location: [facility];  Service: Cardiovascular;  Laterality: N/A;   LIMB SPARING RESECTION HIP W/ SADDLE JOINT REPLACEMENT Right    MINIMALLY INVASIVE MAZE PROCEDURE N/A 03/21/2019   Procedure: MINIMALLY INVASIVE MAZE PROCEDURE;  Surgeon: Rexene Alberts, MD;  Location: [facility];  Service: Open Heart Surgery;  Laterality: N/A;   MITRAL VALVE REPAIR Right 03/21/2019   Procedure: MINIMALLY INVASIVE MITRAL VALVE REPAIR (MVR) using Memo 4D ring size 36;  Surgeon: Rexene Alberts, MD;  Location: [facility];  Service: Open Heart Surgery;  Laterality: Right;   TEE WITHOUT CARDIOVERSION N/A 01/03/2019   Procedure: TRANSESOPHAGEAL ECHOCARDIOGRAM (TEE);  Surgeon: Sueanne Margarita, MD;  Location: [facility];  Service: Cardiovascular;  Laterality: N/A;   TEE WITHOUT CARDIOVERSION N/A 03/21/2019   Procedure: TRANSESOPHAGEAL ECHOCARDIOGRAM (TEE);  Surgeon: Rexene Alberts, MD;  Location: [facility];  Service: Open Heart Surgery;  Laterality: N/A;   TEMPORARY PACEMAKER N/A 03/22/2019   Procedure: TEMPORARY PACEMAKER;  Surgeon: Belva Crome, MD;  Location: [facility];  Service: Cardiovascular;  Laterality: N/A;   TOTAL KNEE ARTHROPLASTY Right 01/19/2014   Procedure: RIGHT TOTAL KNEE ARTHROPLASTY, Steroid injection left knee;  Surgeon: Mcarthur Rossetti, MD;  Location: [facility];  Service: Orthopedics;  Laterality: Right;   TOTAL KNEE ARTHROPLASTY Left 10/23/2014   Procedure: LEFT TOTAL KNEE ARTHROPLASTY;  Surgeon: Mcarthur Rossetti,  MD;  Location: [facility];  Service: Orthopedics;  Laterality: Left;   UMBILICAL HERNIA REPAIR N/A 07/10/2016   Procedure: UMBILICAL HERNIA REPAIR;  Surgeon: Coralie Keens, MD;  Location: [facility];  Service: General;  Laterality: N/A;       Family History  Problem Relation Age of Onset   Hypertension Father    Colon cancer Father        dx in his late 57's   Diabetes Mother    Stomach cancer Neg Hx    Esophageal cancer Neg Hx    Pancreatic cancer Neg Hx    Rectal cancer Neg Hx     Social History   Tobacco Use   Smoking status: Never   Smokeless tobacco: Never  Vaping Use   Vaping Use: Never used  Substance Use Topics   Alcohol use: Yes    Alcohol/week: 2.0 - 4.0 standard drinks    Types: 1 - 2 Standard drinks or equivalent, 1 - 2 Glasses of wine per week    Comment: 1 or 2 drink daily   Drug use:  No    Home Medications Prior to Admission medications   Medication Sig Start Date End Date Taking? Authorizing Provider  ALPRAZolam Duanne Moron) 0.25 MG tablet Take 0.25 mg by mouth at bedtime as needed for sleep.    [provider]  aspirin EC 81 MG EC tablet Take 1 tablet (81 mg total) by mouth daily. 03/27/19   Gold, Wayne E, PA-C  atorvastatin (LIPITOR) 20 MG tablet Take 20 mg by mouth daily.    [provider]  diltiazem 2 % GEL Apply 1 application topically 3 (three) times daily. With 5 % lidocaine 07/11/20   Willia Craze, NP  docusate sodium (COLACE) 100 MG capsule Take 300 mg by mouth daily.    [provider]  HYDROcodone-acetaminophen (NORCO/VICODIN) 5-325 MG tablet Take 1 tablet by mouth every 6 (six) hours as needed for moderate pain.    [provider]  losartan (COZAAR) 50 MG tablet Take 1 tablet by mouth once daily 06/09/21   Lorretta Harp, MD  metoprolol tartrate (LOPRESSOR) 25 MG tablet Take 1/2 (one-half) tablet by mouth twice daily 03/17/21   Lorretta Harp, MD  Multiple Vitamin (MULTIVITAMIN WITH MINERALS)  TABS tablet Take 1 tablet by mouth daily.    [provider]  potassium chloride (KLOR-CON) 10 MEQ tablet TAKE 1  BY MOUTH TWICE DAILY 06/12/21   Lorretta Harp, MD  spironolactone (ALDACTONE) 25 MG tablet TAKE 1 TABLET BY MOUTH ONCE DAILY . APPOINTMENT REQUIRED FOR FUTURE REFILLS 03/05/21   Lorretta Harp, MD  torsemide (DEMADEX) 20 MG tablet Take 1 tablet (20 mg total) by mouth daily. 03/18/21   Lorretta Harp, MD  warfarin (COUMADIN) 5 MG tablet TAKE 1 TABLET BY MOUTH ONCE DAILY AS  DIRECTED  BY  COUMADIN  CLINIC 02/10/21   Deberah Pelton, NP    Allergies    Patient has no known allergies.  Review of Systems   Review of Systems  All other systems reviewed and are negative.  Physical Exam Updated Vital Signs BP (!) 137/97 (BP Location: Left Arm)   Pulse 82   Temp 98.4 F (36.9 C) (Oral)   Resp 18   Ht 1.905 m ('6\' 3"'$ )   Wt 110 kg   SpO2 100%   BMI 30.31 kg/m   Physical Exam Vitals and nursing note reviewed.  Constitutional:      General: He is not in acute distress.    Appearance: Normal appearance. He is well-developed. He is not toxic-appearing.  HENT:     Head:   Eyes:     General: Lids are normal.     Conjunctiva/sclera: Conjunctivae normal.     Pupils: Pupils are equal, round, and reactive to light.  Neck:     Thyroid: No thyroid mass.     Trachea: No tracheal deviation.  Cardiovascular:     Rate and Rhythm: Normal rate and regular rhythm.     Heart sounds: Normal heart sounds. No murmur heard.   No gallop.  Pulmonary:     Effort: Pulmonary effort is normal. No respiratory distress.     Breath sounds: Normal breath sounds. No stridor. No decreased breath sounds, wheezing, rhonchi or rales.  Abdominal:     General: There is no distension.     Palpations: Abdomen is soft.     Tenderness: There is no abdominal tenderness. There is no rebound.  Musculoskeletal:        General: No tenderness. Normal range of motion.  Cervical back: Normal  range of motion and neck supple.  Skin:    General: Skin is warm and dry.     Findings: No abrasion or rash.  Neurological:     Mental Status: He is alert and oriented to person, place, and time. Mental status is at baseline.     GCS: GCS eye subscore is 4. GCS verbal subscore is 5. GCS motor subscore is 6.     Cranial Nerves: Cranial nerves are intact. No cranial nerve deficit.     Sensory: No sensory deficit.     Motor: Motor function is intact.  Psychiatric:        Attention and Perception: Attention normal.        Speech: Speech normal.        Behavior: Behavior normal.    ED Results / Procedures / Treatments   Labs (all labs ordered are listed, but only abnormal results are displayed) Labs Reviewed - No data to display  EKG None  Radiology No results found.  Procedures Procedures   Medications Ordered in ED Medications - No data to display  ED Course  I have reviewed the triage vital signs and the nursing notes.  Pertinent labs & imaging results that were available during my care of the patient were reviewed by me and considered in my medical decision making (see chart for details).    MDM Rules/Calculators/A&P                           Head CT negative.  Will discharge home Final Clinical Impression(s) / ED Diagnoses Final diagnoses:  None    Rx / DC Orders ED Discharge Orders     None        Lacretia Leigh, MD 07/18/21 1356

## 2021-08-04 ENCOUNTER — Other Ambulatory Visit: Payer: Self-pay | Admitting: General Practice

## 2021-08-08 ENCOUNTER — Ambulatory Visit (INDEPENDENT_AMBULATORY_CARE_PROVIDER_SITE_OTHER): Payer: Medicare HMO

## 2021-08-08 ENCOUNTER — Other Ambulatory Visit: Payer: Self-pay

## 2021-08-08 ENCOUNTER — Ambulatory Visit (HOSPITAL_COMMUNITY): Payer: Medicare HMO | Attending: Cardiovascular Disease

## 2021-08-08 DIAGNOSIS — I4819 Other persistent atrial fibrillation: Secondary | ICD-10-CM | POA: Diagnosis not present

## 2021-08-08 DIAGNOSIS — Z9889 Other specified postprocedural states: Secondary | ICD-10-CM | POA: Insufficient documentation

## 2021-08-08 DIAGNOSIS — Z7901 Long term (current) use of anticoagulants: Secondary | ICD-10-CM | POA: Diagnosis not present

## 2021-08-08 DIAGNOSIS — Z954 Presence of other heart-valve replacement: Secondary | ICD-10-CM | POA: Diagnosis not present

## 2021-08-08 DIAGNOSIS — R011 Cardiac murmur, unspecified: Secondary | ICD-10-CM | POA: Insufficient documentation

## 2021-08-08 DIAGNOSIS — I5022 Chronic systolic (congestive) heart failure: Secondary | ICD-10-CM | POA: Insufficient documentation

## 2021-08-08 DIAGNOSIS — I4892 Unspecified atrial flutter: Secondary | ICD-10-CM

## 2021-08-08 DIAGNOSIS — I4891 Unspecified atrial fibrillation: Secondary | ICD-10-CM | POA: Diagnosis not present

## 2021-08-08 DIAGNOSIS — I420 Dilated cardiomyopathy: Secondary | ICD-10-CM | POA: Diagnosis not present

## 2021-08-08 DIAGNOSIS — I480 Paroxysmal atrial fibrillation: Secondary | ICD-10-CM | POA: Diagnosis not present

## 2021-08-08 DIAGNOSIS — Z5181 Encounter for therapeutic drug level monitoring: Secondary | ICD-10-CM

## 2021-08-08 LAB — ECHOCARDIOGRAM COMPLETE
Area-P 1/2: 4.23 cm2
MV VTI: 2.34 cm2
S' Lateral: 3.1 cm

## 2021-08-08 LAB — POCT INR: INR: 2.1 (ref 2.0–3.0)

## 2021-08-08 NOTE — Patient Instructions (Signed)
Description   Continue taking 1 tablet daily except 1.5 tablets on Wednesdays.  [phone] INR check 8 weeks

## 2021-08-19 ENCOUNTER — Telehealth: Payer: Self-pay | Admitting: Cardiovascular Disease

## 2021-08-19 NOTE — Telephone Encounter (Signed)
Patient returned call for echo results.  

## 2021-08-19 NOTE — Telephone Encounter (Signed)
Called, gave ECHO results.   Will route to RN as Juluis Rainier. Thanks!

## 2021-09-15 ENCOUNTER — Other Ambulatory Visit: Payer: Self-pay | Admitting: Cardiovascular Disease

## 2021-09-21 ENCOUNTER — Emergency Department (HOSPITAL_BASED_OUTPATIENT_CLINIC_OR_DEPARTMENT_OTHER)
Admission: EM | Admit: 2021-09-21 | Discharge: 2021-09-21 | Disposition: A | Discharge status: Home / Self Care | Payer: Medicare HMO | Source: Home / Self Care | Attending: Emergency Medicine | Admitting: Emergency Medicine

## 2021-09-21 ENCOUNTER — Encounter (HOSPITAL_BASED_OUTPATIENT_CLINIC_OR_DEPARTMENT_OTHER): Payer: Self-pay

## 2021-09-21 ENCOUNTER — Emergency Department (HOSPITAL_BASED_OUTPATIENT_CLINIC_OR_DEPARTMENT_OTHER): Payer: Medicare HMO

## 2021-09-21 ENCOUNTER — Other Ambulatory Visit: Payer: Self-pay

## 2021-09-21 DIAGNOSIS — S0990XA Unspecified injury of head, initial encounter: Secondary | ICD-10-CM | POA: Insufficient documentation

## 2021-09-21 DIAGNOSIS — Z7982 Long term (current) use of aspirin: Secondary | ICD-10-CM | POA: Insufficient documentation

## 2021-09-21 DIAGNOSIS — T79A22A Traumatic compartment syndrome of left lower extremity, initial encounter: Secondary | ICD-10-CM | POA: Diagnosis not present

## 2021-09-21 DIAGNOSIS — I5023 Acute on chronic systolic (congestive) heart failure: Secondary | ICD-10-CM | POA: Insufficient documentation

## 2021-09-21 DIAGNOSIS — Z79899 Other long term (current) drug therapy: Secondary | ICD-10-CM | POA: Insufficient documentation

## 2021-09-21 DIAGNOSIS — I110 Hypertensive heart disease with heart failure: Secondary | ICD-10-CM | POA: Insufficient documentation

## 2021-09-21 DIAGNOSIS — W19XXXA Unspecified fall, initial encounter: Secondary | ICD-10-CM

## 2021-09-21 DIAGNOSIS — S8012XA Contusion of left lower leg, initial encounter: Secondary | ICD-10-CM | POA: Insufficient documentation

## 2021-09-21 DIAGNOSIS — W01198A Fall on same level from slipping, tripping and stumbling with subsequent striking against other object, initial encounter: Secondary | ICD-10-CM | POA: Insufficient documentation

## 2021-09-21 DIAGNOSIS — Z96653 Presence of artificial knee joint, bilateral: Secondary | ICD-10-CM | POA: Insufficient documentation

## 2021-09-21 NOTE — Discharge Instructions (Signed)
You were seen in the emergency department for evaluation of injuries after a fall.  You had a CAT scan of your head that did not show any intracranial injury.  The x-ray of your left lower leg also did not show any fracture and your hardware was intact.  You have a large hematoma on your leg and you should use the compression wrap and ice to this area.  Follow-up with your primary care doctor.  Return to the emergency department if any worsening or concerning symptoms.

## 2021-09-21 NOTE — ED Provider Notes (Signed)
[facility] EMERGENCY DEPARTMENT Provider Note   CSN: [id number] Arrival date & time: 09/21/21  1053     History Chief Complaint  Patient presents with   [name] is a 65 y.o. male.  Here for evaluation of a mechanical fall that occurred last evening.  He said he tripped over a rug fell and struck his head.  Also struck his left lower leg.  No loss of consciousness.  Woke up this morning and had some swelling to the leg and some bruising around his right temple.  No headache no numbness no weakness.  No chest or abdominal pain.  No neck or back pain.  No numbness or weakness.  He is on blood thinners for A. fib  The history is provided by the patient.  Fall This is a recurrent problem. The current episode started yesterday. The problem occurs rarely. The problem has not changed since onset.Pertinent negatives include no chest pain, no abdominal pain, no headaches and no shortness of breath. Nothing aggravates the symptoms. Nothing relieves the symptoms. He has tried rest for the symptoms. The treatment provided no relief.      Past Medical History:  Diagnosis Date   Acute on chronic systolic (congestive) heart failure (HCC) 12/30/2018   Allergy    Arthritis    Atrial flutter with rapid ventricular response (HCC)    Chronic systolic (congestive) heart failure (HCC)    Dilated aortic root ([place]) 01/04/2019   Dilated cardiomyopathy ([place]) 01/04/2019   GERD (gastroesophageal reflux disease)    history of   Heart murmur    History of colon polyps    Hypertension    Insomnia    Mitral valve prolapse    Mitral valve regurgitation    Persistent atrial fibrillation ([place]) 12/02/2018   S/P Maze operation for atrial fibrillation 03/21/2019   Complete bilateral atrial lesion set using cryothermy and bipolar radiofrequency ablation with clipping of LA appendage via right mini thoracotomy approach   S/P minimally invasive mitral valve repair 03/21/2019   Complex  valvuloplasty including triangular resection of posterior leaflet, artificial Gore-tex neochord placement x4 and 36 mm Sorin Memo 4D ring annuloplasty via right mini thoracotomy approach   Seizures ([place])    had one approx. 30 yrs ago,has not had any since   Severe mitral regurgitation     Patient Active Problem List   Diagnosis Date Noted   Hyperlipidemia 12/13/2020   Atrial fibrillation ([place]) 06/10/2020   Long term (current) use of anticoagulants 06/10/2020   Secondary hypercoagulable state ([place]) 04/24/2020   AV junctional bradycardia 03/22/2019   S/P minimally invasive mitral valve repair  03/21/2019   S/P Maze operation for atrial fibrillation 03/21/2019   Dilated aortic root ([place]) 01/04/2019   Dilated cardiomyopathy ([place]) 01/04/2019   Atrial flutter with rapid ventricular response ([place])    Mitral valve prolapse    Hypertensive urgency 12/30/2018   Acute on chronic systolic (congestive) heart failure ([place]) 96/75/9163   Acute systolic heart failure ([place]) 84/66/5993   Chronic systolic (congestive) heart failure (HCC)    Persistent atrial fibrillation ([place]) 12/02/2018   Right hamstring muscle strain 07/20/2018   Essential hypertension 02/07/2016   Severe mitral regurgitation    Annual physical exam 11/16/2014   Arthritis of left knee 10/23/2014   Status post total left knee replacement 10/23/2014   Arthritis of knee, right 01/19/2014   Status post total knee replacement 01/19/2014   Benign paroxysmal positional vertigo 10/30/2013   Testicular  hypofunction 03/24/2012    Past Surgical History:  Procedure Laterality Date   ANKLE FUSION  left   Dec. 2012   ARTHROSCOPY KNEE W/ DRILLING  bilateral   2012   CARDIAC VALVE REPLACEMENT     CARDIOVERSION N/A 01/03/2019   Procedure: CARDIOVERSION;  Surgeon: Sueanne Margarita, MD;  Location: [facility];  Service: Cardiovascular;  Laterality: N/A;   CARDIOVERSION N/A 06/23/2019   Procedure: CARDIOVERSION;  Surgeon: Fay Records, MD;   Location: [facility];  Service: Cardiovascular;  Laterality: N/A;   CLIPPING OF ATRIAL APPENDAGE  03/21/2019   Procedure: Clipping Of Atrial Appendage using 51mm Atricure Pro2 Clip;  Surgeon: Rexene Alberts, MD;  Location: [facility];  Service: Open Heart Surgery;;   COLONOSCOPY     x2   FOOT ARTHRODESIS  06/23/2012   Procedure: ARTHRODESIS FOOT;  Surgeon: Wylene Simmer, MD;  Location: [facility];  Service: Orthopedics;  Laterality: Left;  Left Subtalar and Talonavicular Joint Revision Arthrodesis  Aspiration of Bone Marrow from Left Hip    HAIR TRANSPLANT     HARDWARE REMOVAL  06/23/2012   Procedure: HARDWARE REMOVAL;  Surgeon: Wylene Simmer, MD;  Location: [facility];  Service: Orthopedics;  Laterality: Left;  Removal of Deep Implant  X's 3   INSERTION OF MESH N/A 07/10/2016   Procedure: INSERTION OF MESH;  Surgeon: Coralie Keens, MD;  Location: [facility];  Service: General;  Laterality: N/A;   JOINT REPLACEMENT     right hip  01-2011   LEFT HEART CATH AND CORONARY ANGIOGRAPHY N/A 03/16/2019   Procedure: LEFT HEART CATH AND CORONARY ANGIOGRAPHY;  Surgeon: Sherren Mocha, MD;  Location: [facility];  Service: Cardiovascular;  Laterality: N/A;   LIMB SPARING RESECTION HIP W/ SADDLE JOINT REPLACEMENT Right    MINIMALLY INVASIVE MAZE PROCEDURE N/A 03/21/2019   Procedure: MINIMALLY INVASIVE MAZE PROCEDURE;  Surgeon: Rexene Alberts, MD;  Location: [facility];  Service: Open Heart Surgery;  Laterality: N/A;   MITRAL VALVE REPAIR Right 03/21/2019   Procedure: MINIMALLY INVASIVE MITRAL VALVE REPAIR (MVR) using Memo 4D ring size 36;  Surgeon: Rexene Alberts, MD;  Location: [facility];  Service: Open Heart Surgery;  Laterality: Right;   TEE WITHOUT CARDIOVERSION N/A 01/03/2019   Procedure: TRANSESOPHAGEAL ECHOCARDIOGRAM (TEE);  Surgeon: Sueanne Margarita, MD;  Location: [facility];  Service: Cardiovascular;  Laterality: N/A;   TEE WITHOUT CARDIOVERSION N/A 03/21/2019   Procedure: TRANSESOPHAGEAL  ECHOCARDIOGRAM (TEE);  Surgeon: Rexene Alberts, MD;  Location: [facility];  Service: Open Heart Surgery;  Laterality: N/A;   TEMPORARY PACEMAKER N/A 03/22/2019   Procedure: TEMPORARY PACEMAKER;  Surgeon: Belva Crome, MD;  Location: [facility];  Service: Cardiovascular;  Laterality: N/A;   TOTAL KNEE ARTHROPLASTY Right 01/19/2014   Procedure: RIGHT TOTAL KNEE ARTHROPLASTY, Steroid injection left knee;  Surgeon: Mcarthur Rossetti, MD;  Location: [facility];  Service: Orthopedics;  Laterality: Right;   TOTAL KNEE ARTHROPLASTY Left 10/23/2014   Procedure: LEFT TOTAL KNEE ARTHROPLASTY;  Surgeon: Mcarthur Rossetti, MD;  Location: [facility];  Service: Orthopedics;  Laterality: Left;   UMBILICAL HERNIA REPAIR N/A 07/10/2016   Procedure: UMBILICAL HERNIA REPAIR;  Surgeon: Coralie Keens, MD;  Location: [facility];  Service: General;  Laterality: N/A;       Family History  Problem Relation Age of Onset   Hypertension Father    Colon cancer Father        dx in his late 4's  Diabetes Mother    Stomach cancer Neg Hx    Esophageal cancer Neg Hx    Pancreatic cancer Neg Hx    Rectal cancer Neg Hx     Social History   Tobacco Use   Smoking status: Never   Smokeless tobacco: Never  Vaping Use   Vaping Use: Never used  Substance Use Topics   Alcohol use: Yes    Alcohol/week: 2.0 - 4.0 standard drinks    Types: 1 - 2 Standard drinks or equivalent, 1 - 2 Glasses of wine per week    Comment: 1 or 2 drink daily   Drug use: No    Home Medications Prior to Admission medications   Medication Sig Start Date End Date Taking? Authorizing Provider  ALPRAZolam Duanne Moron) 0.25 MG tablet Take 0.25 mg by mouth at bedtime as needed for sleep.    [provider]  aspirin EC 81 MG EC tablet Take 1 tablet (81 mg total) by mouth daily. 03/27/19   Gold, Wayne E, PA-C  atorvastatin (LIPITOR) 20 MG tablet Take 20 mg by mouth daily.    [provider]  diltiazem 2 % GEL  Apply 1 application topically 3 (three) times daily. With 5 % lidocaine 07/11/20   Willia Craze, NP  docusate sodium (COLACE) 100 MG capsule Take 300 mg by mouth daily.    [provider]  HYDROcodone-acetaminophen (NORCO/VICODIN) 5-325 MG tablet Take 1 tablet by mouth every 6 (six) hours as needed for moderate pain.    [provider]  losartan (COZAAR) 50 MG tablet Take 1 tablet by mouth once daily 09/16/21   Lorretta Harp, MD  metoprolol tartrate (LOPRESSOR) 25 MG tablet Take 1/2 (one-half) tablet by mouth twice daily 03/17/21   Lorretta Harp, MD  Multiple Vitamin (MULTIVITAMIN WITH MINERALS) TABS tablet Take 1 tablet by mouth daily.    [provider]  potassium chloride (KLOR-CON) 10 MEQ tablet TAKE 1  BY MOUTH TWICE DAILY 06/12/21   Lorretta Harp, MD  spironolactone (ALDACTONE) 25 MG tablet TAKE 1 TABLET BY MOUTH ONCE DAILY . APPOINTMENT REQUIRED FOR FUTURE REFILLS 03/05/21   Lorretta Harp, MD  torsemide (DEMADEX) 20 MG tablet Take 1 tablet (20 mg total) by mouth daily. 03/18/21   Lorretta Harp, MD  warfarin (COUMADIN) 5 MG tablet TAKE 1 TABLET BY MOUTH ONCE DAILY AS DIRECTED BY  COUMADIN  CLINIC 08/04/21   Deberah Pelton, NP    Allergies    Patient has no known allergies.  Review of Systems   Review of Systems  Constitutional:  Negative for fever.  HENT:  Negative for nosebleeds and trouble swallowing.   Eyes:  Negative for visual disturbance.  Respiratory:  Negative for shortness of breath.   Cardiovascular:  Negative for chest pain.  Gastrointestinal:  Negative for abdominal pain.  Genitourinary:  Negative for hematuria.  Musculoskeletal:  Negative for back pain and neck pain.  Skin:  Negative for rash.  Neurological:  Negative for headaches.   Physical Exam Updated Vital Signs BP (!) 123/93 (BP Location: Right Arm)   Pulse (!) 115   Temp 98 F (36.7 C) (Oral)   Resp 16   Ht 6\' 3"  (1.905 m)   Wt 103.4 kg   SpO2 99%   BMI  28.50 kg/m   Physical Exam Vitals and nursing note reviewed.  Constitutional:      General: He is not in acute distress.    Appearance: Normal appearance. He  is well-developed.  HENT:     Head: Normocephalic.     Comments: He has a small scabbed laceration just lateral to his right eye and some bruising around his right orbit. Eyes:     Conjunctiva/sclera: Conjunctivae normal.  Cardiovascular:     Rate and Rhythm: Tachycardia present.     Heart sounds: No murmur heard. Pulmonary:     Effort: Pulmonary effort is normal. No respiratory distress.     Breath sounds: Normal breath sounds.  Abdominal:     Palpations: Abdomen is soft.     Tenderness: There is no abdominal tenderness. There is no guarding or rebound.  Musculoskeletal:        General: Swelling present. Normal range of motion.     Cervical back: Neck supple.     Comments: He has a focal hematoma to his left lateral lower leg that is approximately 6 cm.  Skin:    General: Skin is warm and dry.     Capillary Refill: Capillary refill takes less than 2 seconds.  Neurological:     General: No focal deficit present.     Mental Status: He is alert.     Motor: No weakness.     Gait: Gait normal.  Psychiatric:        Mood and Affect: Mood normal.    ED Results / Procedures / Treatments   Labs (all labs ordered are listed, but only abnormal results are displayed) Labs Reviewed - No data to display  EKG None  Radiology DG Tibia/Fibula Left  Result Date: 09/21/2021 CLINICAL DATA:  Pt had a fall last night. Hit left tibia on night stand. Bruising on left lateral tib/fib. Hx of left knee replacement and ORIF EXAM: LEFT TIBIA AND FIBULA - 2 VIEW COMPARISON:  None. FINDINGS: Status post total knee replacement. There is an intramedullary rod with screw fixation in the distal tibia. There is no evidence of fracture or dislocation none there is focal soft tissue. Severe degenerative changes in the ankle. Is swelling in the  lateral aspect of the calf. IMPRESSION: No acute osseous abnormality.  Intact orthopedic hardware. Electronically Signed   By: Audie Pinto M.D.   On: 09/21/2021 11:54   CT Head Wo Contrast  Result Date: 09/21/2021 CLINICAL DATA:  08/12/2019 EXAM: CT HEAD WITHOUT CONTRAST TECHNIQUE: Contiguous axial images were obtained from the base of the skull through the vertex without intravenous contrast. COMPARISON:  08/12/2019 FINDINGS: Brain: No acute intracranial hemorrhage. No midline shift or mass effect. Gray-white differentiation maintained. Unremarkable appearance of the ventricular system. Vascular: Unremarkable. Skull: No acute fracture.  No aggressive bone lesion identified. Sinuses/Orbits: Unremarkable appearance of the orbits. Mastoid air cells clear. No middle ear effusion. No significant sinus disease. Other: Defect within the anterior nasal septum, which can be a consequence of chronic vaso constrictive substances. Interval resolution of the prior soft tissue swelling in the scalp IMPRESSION: Negative for acute intracranial abnormality Electronically Signed   By: Corrie Mckusick D.O.   On: 09/21/2021 11:47    Procedures Procedures   Medications Ordered in ED Medications - No data to display  ED Course  I have reviewed the triage vital signs and the nursing notes.  Pertinent labs & imaging results that were available during my care of the patient were reviewed by me and considered in my medical decision making (see chart for details).  Clinical Course as of 09/21/21 1201  Sun Sep 21, 2021  1151 Patient's tib-fib x-ray does not show any  acute fracture.  Hardware appears intact.  Awaiting radiology reading. [MB]    Clinical Course User Index [MB] Hayden Rasmussen, MD   MDM Rules/Calculators/A&P                          65 year old male with history of A. fib on anticoagulation here after mechanical fall.  Has hematoma to right forehead and hematoma to the left lower leg.  CT and  x-ray imaging ordered and interpreted by me as no acute traumatic findings.  Reviewed with patient and he is comfortable plan for outpatient management of this with ice rest Tylenol, recommended compression wrapping of left lower leg due to decreased hematoma size.  Return instructions discussed  Final Clinical Impression(s) / ED Diagnoses Final diagnoses:  Fall, initial encounter  Injury of head, initial encounter  Hematoma of left lower leg    Rx / DC Orders ED Discharge Orders     None        Hayden Rasmussen, MD 09/21/21 1724

## 2021-09-21 NOTE — ED Notes (Signed)
Pt bedded immediately. Primary RN and MD aware of pt. MD to bedside at this time.

## 2021-09-21 NOTE — ED Triage Notes (Signed)
Pt presents to ED from home C/O mechanical fall last night when he tripped over a rug, hitting head on floor and L lower leg on cabinet. Significant swelling to leg, bruising around R eye. Pt reports blood thinner use.

## 2021-09-22 ENCOUNTER — Emergency Department (HOSPITAL_COMMUNITY): Payer: Medicare HMO | Admitting: Anesthesiology

## 2021-09-22 ENCOUNTER — Encounter (HOSPITAL_COMMUNITY)
Admission: EM | Disposition: A | Discharge status: Home / Self Care | Payer: Self-pay | Source: Home / Self Care | Attending: Orthopaedic Surgery

## 2021-09-22 ENCOUNTER — Inpatient Hospital Stay (HOSPITAL_BASED_OUTPATIENT_CLINIC_OR_DEPARTMENT_OTHER)
Admission: EM | Admit: 2021-09-22 | Discharge: 2021-09-28 | DRG: 904 | Disposition: A | Discharge status: Home / Self Care | Payer: Medicare HMO | Attending: Orthopaedic Surgery | Admitting: Orthopaedic Surgery

## 2021-09-22 ENCOUNTER — Emergency Department (HOSPITAL_BASED_OUTPATIENT_CLINIC_OR_DEPARTMENT_OTHER): Payer: Medicare HMO

## 2021-09-22 ENCOUNTER — Encounter (HOSPITAL_BASED_OUTPATIENT_CLINIC_OR_DEPARTMENT_OTHER): Payer: Self-pay | Admitting: *Deleted

## 2021-09-22 ENCOUNTER — Other Ambulatory Visit: Payer: Self-pay

## 2021-09-22 DIAGNOSIS — Z7901 Long term (current) use of anticoagulants: Secondary | ICD-10-CM | POA: Diagnosis not present

## 2021-09-22 DIAGNOSIS — I5022 Chronic systolic (congestive) heart failure: Secondary | ICD-10-CM | POA: Diagnosis not present

## 2021-09-22 DIAGNOSIS — I420 Dilated cardiomyopathy: Secondary | ICD-10-CM | POA: Diagnosis not present

## 2021-09-22 DIAGNOSIS — I110 Hypertensive heart disease with heart failure: Secondary | ICD-10-CM | POA: Diagnosis not present

## 2021-09-22 DIAGNOSIS — T79A22D Traumatic compartment syndrome of left lower extremity, subsequent encounter: Secondary | ICD-10-CM | POA: Diagnosis not present

## 2021-09-22 DIAGNOSIS — Z79899 Other long term (current) drug therapy: Secondary | ICD-10-CM | POA: Diagnosis not present

## 2021-09-22 DIAGNOSIS — Z20822 Contact with and (suspected) exposure to covid-19: Secondary | ICD-10-CM | POA: Diagnosis not present

## 2021-09-22 DIAGNOSIS — I4819 Other persistent atrial fibrillation: Secondary | ICD-10-CM | POA: Diagnosis not present

## 2021-09-22 DIAGNOSIS — E785 Hyperlipidemia, unspecified: Secondary | ICD-10-CM | POA: Diagnosis present

## 2021-09-22 DIAGNOSIS — Z7982 Long term (current) use of aspirin: Secondary | ICD-10-CM

## 2021-09-22 DIAGNOSIS — W1809XA Striking against other object with subsequent fall, initial encounter: Secondary | ICD-10-CM | POA: Diagnosis present

## 2021-09-22 DIAGNOSIS — Z96641 Presence of right artificial hip joint: Secondary | ICD-10-CM | POA: Diagnosis present

## 2021-09-22 DIAGNOSIS — S8012XA Contusion of left lower leg, initial encounter: Secondary | ICD-10-CM | POA: Diagnosis not present

## 2021-09-22 DIAGNOSIS — Z96653 Presence of artificial knee joint, bilateral: Secondary | ICD-10-CM | POA: Diagnosis present

## 2021-09-22 DIAGNOSIS — R791 Abnormal coagulation profile: Secondary | ICD-10-CM | POA: Diagnosis present

## 2021-09-22 DIAGNOSIS — Z952 Presence of prosthetic heart valve: Secondary | ICD-10-CM | POA: Diagnosis not present

## 2021-09-22 DIAGNOSIS — R58 Hemorrhage, not elsewhere classified: Secondary | ICD-10-CM | POA: Diagnosis present

## 2021-09-22 DIAGNOSIS — S0083XA Contusion of other part of head, initial encounter: Secondary | ICD-10-CM | POA: Diagnosis present

## 2021-09-22 DIAGNOSIS — T79A12A Traumatic compartment syndrome of left upper extremity, initial encounter: Secondary | ICD-10-CM | POA: Diagnosis present

## 2021-09-22 DIAGNOSIS — Z981 Arthrodesis status: Secondary | ICD-10-CM

## 2021-09-22 DIAGNOSIS — K219 Gastro-esophageal reflux disease without esophagitis: Secondary | ICD-10-CM | POA: Diagnosis not present

## 2021-09-22 DIAGNOSIS — T79A22A Traumatic compartment syndrome of left lower extremity, initial encounter: Principal | ICD-10-CM | POA: Diagnosis present

## 2021-09-22 DIAGNOSIS — Z9889 Other specified postprocedural states: Secondary | ICD-10-CM

## 2021-09-22 HISTORY — PX: INCISION AND DRAINAGE WOUND WITH FASCIOTOMY: SHX5634

## 2021-09-22 HISTORY — PX: APPLICATION OF WOUND VAC: SHX5189

## 2021-09-22 LAB — CBC WITH DIFFERENTIAL/PLATELET
Abs Immature Granulocytes: 0.03 10*3/uL (ref 0.00–0.07)
Basophils Absolute: 0 10*3/uL (ref 0.0–0.1)
Basophils Relative: 0 %
Eosinophils Absolute: 0.1 10*3/uL (ref 0.0–0.5)
Eosinophils Relative: 2 %
HCT: 42.1 % (ref 39.0–52.0)
Hemoglobin: 13.9 g/dL (ref 13.0–17.0)
Immature Granulocytes: 0 %
Lymphocytes Relative: 16 %
Lymphs Abs: 1.4 10*3/uL (ref 0.7–4.0)
MCH: 31.2 pg (ref 26.0–34.0)
MCHC: 33 g/dL (ref 30.0–36.0)
MCV: 94.6 fL (ref 80.0–100.0)
Monocytes Absolute: 0.9 10*3/uL (ref 0.1–1.0)
Monocytes Relative: 10 %
Neutro Abs: 6.5 10*3/uL (ref 1.7–7.7)
Neutrophils Relative %: 72 %
Platelets: 180 10*3/uL (ref 150–400)
RBC: 4.45 MIL/uL (ref 4.22–5.81)
RDW: 13.8 % (ref 11.5–15.5)
WBC: 9 10*3/uL (ref 4.0–10.5)
nRBC: 0 % (ref 0.0–0.2)

## 2021-09-22 LAB — COMPREHENSIVE METABOLIC PANEL
ALT: 26 U/L (ref 0–44)
AST: 23 U/L (ref 15–41)
Albumin: 4.1 g/dL (ref 3.5–5.0)
Alkaline Phosphatase: 42 U/L (ref 38–126)
Anion gap: 9 (ref 5–15)
BUN: 33 mg/dL — ABNORMAL HIGH (ref 8–23)
CO2: 28 mmol/L (ref 22–32)
Calcium: 9 mg/dL (ref 8.9–10.3)
Chloride: 98 mmol/L (ref 98–111)
Creatinine, Ser: 1.2 mg/dL (ref 0.61–1.24)
GFR, Estimated: >60 mL/min (ref >60)
Glucose, Bld: 97 mg/dL (ref 70–99)
Potassium: 4.2 mmol/L (ref 3.5–5.1)
Sodium: 135 mmol/L (ref 135–145)
Total Bilirubin: 1.3 mg/dL — ABNORMAL HIGH (ref 0.3–1.2)
Total Protein: 6.2 g/dL — ABNORMAL LOW (ref 6.5–8.1)

## 2021-09-22 LAB — PROTIME-INR
INR: 2.3 — ABNORMAL HIGH (ref 0.8–1.2)
Prothrombin Time: 25.6 seconds — ABNORMAL HIGH (ref 11.4–15.2)

## 2021-09-22 LAB — LACTIC ACID, PLASMA: Lactic Acid, Venous: 1.7 mmol/L (ref 0.5–1.9)

## 2021-09-22 SURGERY — INCISION AND DRAINAGE WOUND WITH FASCIOTOMY
Anesthesia: General | Site: Leg Lower | Laterality: Left

## 2021-09-22 MED ORDER — SUCCINYLCHOLINE CHLORIDE 200 MG/10ML IV SOSY
PREFILLED_SYRINGE | INTRAVENOUS | Status: AC
  Filled 2021-09-22: qty 20

## 2021-09-22 MED ORDER — TORSEMIDE 20 MG PO TABS
20.0000 mg | ORAL_TABLET | Freq: Every day | ORAL | Status: DC
  Administered 2021-09-23 – 2021-09-27 (×5): 20 mg via ORAL
  Filled 2021-09-22 (×5): qty 1

## 2021-09-22 MED ORDER — OXYCODONE-ACETAMINOPHEN 5-325 MG PO TABS
1.0000 | ORAL_TABLET | Freq: Once | ORAL | Status: AC
  Administered 2021-09-22: 1 via ORAL
  Filled 2021-09-22: qty 1

## 2021-09-22 MED ORDER — PHENYLEPHRINE 40 MCG/ML (10ML) SYRINGE FOR IV PUSH (FOR BLOOD PRESSURE SUPPORT)
PREFILLED_SYRINGE | INTRAVENOUS | Status: AC
  Filled 2021-09-22: qty 10

## 2021-09-22 MED ORDER — METHOCARBAMOL 1000 MG/10ML IJ SOLN
500.0000 mg | Freq: Four times a day (QID) | INTRAVENOUS | Status: DC | PRN
  Filled 2021-09-22 (×3): qty 5

## 2021-09-22 MED ORDER — DOCUSATE SODIUM 100 MG PO CAPS
100.0000 mg | ORAL_CAPSULE | Freq: Two times a day (BID) | ORAL | Status: DC
  Administered 2021-09-23 – 2021-09-28 (×11): 100 mg via ORAL
  Filled 2021-09-22 (×11): qty 1

## 2021-09-22 MED ORDER — ALPRAZOLAM 0.25 MG PO TABS
0.2500 mg | ORAL_TABLET | Freq: Every evening | ORAL | Status: DC | PRN
  Administered 2021-09-23: 0.25 mg via ORAL
  Filled 2021-09-22: qty 1

## 2021-09-22 MED ORDER — POTASSIUM CHLORIDE CRYS ER 10 MEQ PO TBCR
10.0000 meq | EXTENDED_RELEASE_TABLET | Freq: Two times a day (BID) | ORAL | Status: DC
  Administered 2021-09-23 – 2021-09-28 (×11): 10 meq via ORAL
  Filled 2021-09-22 (×11): qty 1

## 2021-09-22 MED ORDER — FENTANYL CITRATE (PF) 250 MCG/5ML IJ SOLN
INTRAMUSCULAR | Status: AC
  Filled 2021-09-22: qty 5

## 2021-09-22 MED ORDER — VITAMIN K1 10 MG/ML IJ SOLN
10.0000 mg | INTRAVENOUS | Status: AC
  Administered 2021-09-22: 10 mg via INTRAVENOUS
  Filled 2021-09-22 (×2): qty 1

## 2021-09-22 MED ORDER — CEFAZOLIN SODIUM 1 G IJ SOLR
INTRAMUSCULAR | Status: AC
  Filled 2021-09-22: qty 20

## 2021-09-22 MED ORDER — SPIRONOLACTONE 25 MG PO TABS
25.0000 mg | ORAL_TABLET | Freq: Every day | ORAL | Status: DC
  Administered 2021-09-23 – 2021-09-28 (×6): 25 mg via ORAL
  Filled 2021-09-22 (×6): qty 1

## 2021-09-22 MED ORDER — PROMETHAZINE HCL 25 MG/ML IJ SOLN
6.2500 mg | INTRAMUSCULAR | Status: DC | PRN
Start: 2021-09-22 — End: 2021-09-22

## 2021-09-22 MED ORDER — LIDOCAINE 2% (20 MG/ML) 5 ML SYRINGE
INTRAMUSCULAR | Status: AC
  Filled 2021-09-22: qty 10

## 2021-09-22 MED ORDER — ONDANSETRON HCL 4 MG/2ML IJ SOLN
INTRAMUSCULAR | Status: DC | PRN
  Administered 2021-09-22: 4 mg via INTRAVENOUS

## 2021-09-22 MED ORDER — ACETAMINOPHEN 325 MG PO TABS
325.0000 mg | ORAL_TABLET | Freq: Four times a day (QID) | ORAL | Status: DC | PRN
  Administered 2021-09-24 – 2021-09-27 (×2): 650 mg via ORAL
  Filled 2021-09-22 (×2): qty 2

## 2021-09-22 MED ORDER — CEFAZOLIN SODIUM-DEXTROSE 1-4 GM/50ML-% IV SOLN
1.0000 g | Freq: Four times a day (QID) | INTRAVENOUS | Status: DC
  Administered 2021-09-23 (×2): 1 g via INTRAVENOUS
  Filled 2021-09-22 (×2): qty 50

## 2021-09-22 MED ORDER — ROCURONIUM BROMIDE 10 MG/ML (PF) SYRINGE
PREFILLED_SYRINGE | INTRAVENOUS | Status: AC
  Filled 2021-09-22: qty 10

## 2021-09-22 MED ORDER — ATORVASTATIN CALCIUM 10 MG PO TABS
20.0000 mg | ORAL_TABLET | Freq: Every day | ORAL | Status: DC
  Administered 2021-09-23 – 2021-09-28 (×6): 20 mg via ORAL
  Filled 2021-09-22 (×6): qty 2

## 2021-09-22 MED ORDER — LACTATED RINGERS IV SOLN: INTRAVENOUS | Status: DC | PRN

## 2021-09-22 MED ORDER — PROPOFOL 10 MG/ML IV BOLUS
INTRAVENOUS | Status: AC
  Filled 2021-09-22: qty 20

## 2021-09-22 MED ORDER — EPHEDRINE 5 MG/ML INJ
INTRAVENOUS | Status: AC
  Filled 2021-09-22: qty 5

## 2021-09-22 MED ORDER — METOPROLOL TARTRATE 12.5 MG HALF TABLET
12.5000 mg | ORAL_TABLET | Freq: Two times a day (BID) | ORAL | Status: DC
  Administered 2021-09-22 – 2021-09-27 (×10): 12.5 mg via ORAL
  Filled 2021-09-22 (×10): qty 1

## 2021-09-22 MED ORDER — IOHEXOL 350 MG/ML SOLN
125.0000 mL | Freq: Once | INTRAVENOUS | Status: AC | PRN
  Administered 2021-09-22: 125 mL via INTRAVENOUS

## 2021-09-22 MED ORDER — OXYCODONE HCL 5 MG PO TABS
5.0000 mg | ORAL_TABLET | ORAL | Status: DC | PRN
  Administered 2021-09-23 – 2021-09-26 (×3): 10 mg via ORAL
  Filled 2021-09-22 (×5): qty 2
  Filled 2021-09-22: qty 1

## 2021-09-22 MED ORDER — HYDROMORPHONE HCL 1 MG/ML IJ SOLN
1.0000 mg | Freq: Once | INTRAMUSCULAR | Status: AC
  Administered 2021-09-22: 1 mg via INTRAMUSCULAR
  Filled 2021-09-22: qty 1

## 2021-09-22 MED ORDER — MIDAZOLAM HCL 2 MG/2ML IJ SOLN
INTRAMUSCULAR | Status: AC
  Filled 2021-09-22: qty 2

## 2021-09-22 MED ORDER — ONDANSETRON HCL 4 MG PO TABS: 4.0000 mg | ORAL_TABLET | Freq: Four times a day (QID) | ORAL | Status: DC | PRN

## 2021-09-22 MED ORDER — PROPOFOL 10 MG/ML IV BOLUS
INTRAVENOUS | Status: DC | PRN
  Administered 2021-09-22: 140 mg via INTRAVENOUS

## 2021-09-22 MED ORDER — SODIUM CHLORIDE 0.9 % IV SOLN: INTRAVENOUS | Status: DC

## 2021-09-22 MED ORDER — SODIUM CHLORIDE 0.9 % IR SOLN
Status: DC | PRN
  Administered 2021-09-22: 3000 mL

## 2021-09-22 MED ORDER — ONDANSETRON HCL 4 MG/2ML IJ SOLN: 4.0000 mg | Freq: Four times a day (QID) | INTRAMUSCULAR | Status: DC | PRN

## 2021-09-22 MED ORDER — LOSARTAN POTASSIUM 50 MG PO TABS
50.0000 mg | ORAL_TABLET | Freq: Every day | ORAL | Status: DC
  Administered 2021-09-23 – 2021-09-28 (×6): 50 mg via ORAL
  Filled 2021-09-22 (×6): qty 1

## 2021-09-22 MED ORDER — HYDROMORPHONE HCL 1 MG/ML IJ SOLN
0.5000 mg | INTRAMUSCULAR | Status: DC | PRN
  Administered 2021-09-23 – 2021-09-27 (×20): 1 mg via INTRAVENOUS
  Filled 2021-09-22 (×20): qty 1

## 2021-09-22 MED ORDER — METOCLOPRAMIDE HCL 5 MG PO TABS: 5.0000 mg | ORAL_TABLET | Freq: Three times a day (TID) | ORAL | Status: DC | PRN

## 2021-09-22 MED ORDER — HYDROMORPHONE HCL 1 MG/ML IJ SOLN
1.0000 mg | Freq: Once | INTRAMUSCULAR | Status: AC
  Administered 2021-09-22: 1 mg via INTRAVENOUS
  Filled 2021-09-22: qty 1

## 2021-09-22 MED ORDER — PROTHROMBIN COMPLEX CONC HUMAN 500 UNITS IV KIT
1520.0000 [IU] | PACK | Status: AC
  Administered 2021-09-22: 1520 [IU] via INTRAVENOUS
  Filled 2021-09-22: qty 1014

## 2021-09-22 MED ORDER — PROTHROMBIN COMPLEX CONC HUMAN 500 UNITS IV KIT
1654.0000 [IU] | PACK | Status: DC
  Filled 2021-09-22: qty 1654

## 2021-09-22 MED ORDER — ADULT MULTIVITAMIN W/MINERALS CH
1.0000 | ORAL_TABLET | Freq: Every day | ORAL | Status: DC
  Administered 2021-09-23 – 2021-09-28 (×6): 1 via ORAL
  Filled 2021-09-22 (×6): qty 1

## 2021-09-22 MED ORDER — ESMOLOL HCL 100 MG/10ML IV SOLN
INTRAVENOUS | Status: DC | PRN
  Administered 2021-09-22: 40 mg via INTRAVENOUS
  Administered 2021-09-22: 20 mg via INTRAVENOUS

## 2021-09-22 MED ORDER — METOCLOPRAMIDE HCL 5 MG/ML IJ SOLN: 5.0000 mg | Freq: Three times a day (TID) | INTRAMUSCULAR | Status: DC | PRN

## 2021-09-22 MED ORDER — LIDOCAINE HCL (CARDIAC) PF 100 MG/5ML IV SOSY
PREFILLED_SYRINGE | INTRAVENOUS | Status: DC | PRN
  Administered 2021-09-22: 100 mg via INTRATRACHEAL

## 2021-09-22 MED ORDER — ARTIFICIAL TEARS OPHTHALMIC OINT
TOPICAL_OINTMENT | OPHTHALMIC | Status: AC
  Filled 2021-09-22: qty 3.5

## 2021-09-22 MED ORDER — FENTANYL CITRATE (PF) 250 MCG/5ML IJ SOLN
INTRAMUSCULAR | Status: DC | PRN
  Administered 2021-09-22: 50 ug via INTRAVENOUS
  Administered 2021-09-22 (×4): 25 ug via INTRAVENOUS
  Administered 2021-09-22: 100 ug via INTRAVENOUS

## 2021-09-22 MED ORDER — PROTHROMBIN COMPLEX CONC HUMAN 500 UNITS IV KIT
PACK | INTRAVENOUS | Status: AC
  Filled 2021-09-22: qty 1500

## 2021-09-22 MED ORDER — ASPIRIN EC 81 MG PO TBEC
81.0000 mg | DELAYED_RELEASE_TABLET | Freq: Every day | ORAL | Status: DC
  Administered 2021-09-23 – 2021-09-28 (×6): 81 mg via ORAL
  Filled 2021-09-22 (×6): qty 1

## 2021-09-22 MED ORDER — METHOCARBAMOL 500 MG PO TABS
500.0000 mg | ORAL_TABLET | Freq: Four times a day (QID) | ORAL | Status: DC | PRN
  Administered 2021-09-23 – 2021-09-28 (×16): 500 mg via ORAL
  Filled 2021-09-22 (×16): qty 1

## 2021-09-22 MED ORDER — AMISULPRIDE (ANTIEMETIC) 5 MG/2ML IV SOLN: 10.0000 mg | Freq: Once | INTRAVENOUS | Status: DC | PRN

## 2021-09-22 MED ORDER — CEFAZOLIN SODIUM-DEXTROSE 2-3 GM-%(50ML) IV SOLR
INTRAVENOUS | Status: DC | PRN
  Administered 2021-09-22: 2 g via INTRAVENOUS

## 2021-09-22 MED ORDER — OXYCODONE HCL 5 MG PO TABS
10.0000 mg | ORAL_TABLET | ORAL | Status: DC | PRN
  Administered 2021-09-23 – 2021-09-25 (×6): 15 mg via ORAL
  Administered 2021-09-25: 10 mg via ORAL
  Administered 2021-09-25 – 2021-09-28 (×8): 15 mg via ORAL
  Filled 2021-09-22 (×13): qty 3

## 2021-09-22 MED ORDER — DIPHENHYDRAMINE HCL 12.5 MG/5ML PO ELIX: 12.5000 mg | ORAL_SOLUTION | ORAL | Status: DC | PRN

## 2021-09-22 MED ORDER — FENTANYL CITRATE (PF) 100 MCG/2ML IJ SOLN: 25.0000 ug | INTRAMUSCULAR | Status: DC | PRN

## 2021-09-22 MED ORDER — MIDAZOLAM HCL 5 MG/5ML IJ SOLN
INTRAMUSCULAR | Status: DC | PRN
  Administered 2021-09-22: 2 mg via INTRAVENOUS

## 2021-09-22 MED ORDER — ONDANSETRON HCL 4 MG/2ML IJ SOLN
INTRAMUSCULAR | Status: AC
  Filled 2021-09-22: qty 2

## 2021-09-22 SURGICAL SUPPLY — 53 items
BAG COUNTER SPONGE SURGICOUNT (BAG) ×2 IMPLANT
BAG SURGICOUNT SPONGE COUNTING (BAG) ×1
BLADE SURG 10 STRL SS (BLADE) ×3 IMPLANT
BNDG COHESIVE 4X5 TAN STRL (GAUZE/BANDAGES/DRESSINGS) ×3 IMPLANT
BNDG COHESIVE 6X5 TAN STRL LF (GAUZE/BANDAGES/DRESSINGS) ×6 IMPLANT
BNDG ELASTIC 3X5.8 VLCR STR LF (GAUZE/BANDAGES/DRESSINGS) IMPLANT
BNDG ELASTIC 6X10 VLCR STRL LF (GAUZE/BANDAGES/DRESSINGS) ×3 IMPLANT
CANISTER WOUNDNEG PRESSURE 500 (CANNISTER) ×3 IMPLANT
COVER SURGICAL LIGHT HANDLE (MISCELLANEOUS) ×3 IMPLANT
CUFF TOURN SGL QUICK 18X4 (TOURNIQUET CUFF) ×3 IMPLANT
CUFF TOURN SGL QUICK 34 (TOURNIQUET CUFF)
CUFF TRNQT CYL 34X4.125X (TOURNIQUET CUFF) IMPLANT
DRAPE ORTHO SPLIT 77X108 STRL (DRAPES) ×6
DRAPE SURG 17X23 STRL (DRAPES) IMPLANT
DRAPE SURG ORHT 6 SPLT 77X108 (DRAPES) ×2 IMPLANT
DRAPE U-SHAPE 47X51 STRL (DRAPES) ×3 IMPLANT
DRSG VAC ATS MED SENSATRAC (GAUZE/BANDAGES/DRESSINGS) ×3 IMPLANT
DURAPREP 26ML APPLICATOR (WOUND CARE) ×3 IMPLANT
ELECT REM PT RETURN 9FT ADLT (ELECTROSURGICAL)
ELECTRODE REM PT RTRN 9FT ADLT (ELECTROSURGICAL) IMPLANT
GAUZE SPONGE 4X4 12PLY STRL (GAUZE/BANDAGES/DRESSINGS) ×3 IMPLANT
GAUZE XEROFORM 1X8 LF (GAUZE/BANDAGES/DRESSINGS) ×3 IMPLANT
GLOVE SRG 8 PF TXTR STRL LF DI (GLOVE) ×2 IMPLANT
GLOVE SURG ENC MOIS LTX SZ8 (GLOVE) ×3 IMPLANT
GLOVE SURG ORTHO LTX SZ7.5 (GLOVE) ×3 IMPLANT
GLOVE SURG UNDER POLY LF SZ8 (GLOVE) ×6
GOWN STRL REUS W/ TWL LRG LVL3 (GOWN DISPOSABLE) ×1 IMPLANT
GOWN STRL REUS W/ TWL XL LVL3 (GOWN DISPOSABLE) ×2 IMPLANT
GOWN STRL REUS W/TWL LRG LVL3 (GOWN DISPOSABLE) ×3
GOWN STRL REUS W/TWL XL LVL3 (GOWN DISPOSABLE) ×6
HANDPIECE INTERPULSE COAX TIP (DISPOSABLE)
KIT BASIN OR (CUSTOM PROCEDURE TRAY) ×3 IMPLANT
KIT TURNOVER KIT B (KITS) ×3 IMPLANT
MANIFOLD NEPTUNE II (INSTRUMENTS) ×3 IMPLANT
NS IRRIG 1000ML POUR BTL (IV SOLUTION) ×3 IMPLANT
PACK ORTHO EXTREMITY (CUSTOM PROCEDURE TRAY) ×3 IMPLANT
PAD ARMBOARD 7.5X6 YLW CONV (MISCELLANEOUS) ×6 IMPLANT
PADDING CAST ABS 4INX4YD NS (CAST SUPPLIES) ×4
PADDING CAST ABS COTTON 4X4 ST (CAST SUPPLIES) ×2 IMPLANT
PADDING CAST COTTON 6X4 STRL (CAST SUPPLIES) ×3 IMPLANT
SET HNDPC FAN SPRY TIP SCT (DISPOSABLE) IMPLANT
SPONGE T-LAP 18X18 ~~LOC~~+RFID (SPONGE) ×3 IMPLANT
STOCKINETTE IMPERVIOUS 9X36 MD (GAUZE/BANDAGES/DRESSINGS) ×3 IMPLANT
SUT ETHILON 2 0 FS 18 (SUTURE) IMPLANT
SUT ETHILON 2 0 PSLX (SUTURE) ×9 IMPLANT
SUT ETHILON 3 0 PS 1 (SUTURE) IMPLANT
SWAB CULTURE ESWAB REG 1ML (MISCELLANEOUS) IMPLANT
TOWEL GREEN STERILE (TOWEL DISPOSABLE) ×3 IMPLANT
TOWEL GREEN STERILE FF (TOWEL DISPOSABLE) ×3 IMPLANT
TUBE CONNECTING 12'X1/4 (SUCTIONS) ×1
TUBE CONNECTING 12X1/4 (SUCTIONS) ×2 IMPLANT
UNDERPAD 30X36 HEAVY ABSORB (UNDERPADS AND DIAPERS) ×3 IMPLANT
YANKAUER SUCT BULB TIP NO VENT (SUCTIONS) ×3 IMPLANT

## 2021-09-22 NOTE — Anesthesia Preprocedure Evaluation (Signed)
Anesthesia Evaluation  Patient identified by MRN, date of birth, ID band Patient awake    Reviewed: Allergy & Precautions, NPO status , Patient's Chart, lab work & pertinent test results, reviewed documented beta blocker date and time   History of Anesthesia Complications Negative for: history of anesthetic complications  Airway Mallampati: II  TM Distance: >3 FB Neck ROM: Full    Dental  (+) Dental Advisory Given, Chipped,    Pulmonary neg pulmonary ROS,    Pulmonary exam normal breath sounds clear to auscultation       Cardiovascular hypertension, Pt. on medications +CHF  Normal cardiovascular exam+ dysrhythmias (on Apixaban) Atrial Fibrillation + Valvular Problems/Murmurs MR  Rate:Normal  S/P MVR TEE 1. Left ventricular ejection fraction, by estimation, is 45 to 50%. The left ventricle has mildly decreased function. The left ventricle has no regional wall motion abnormalities. There is mild concentric left ventricular hypertrophy. Left ventricular  diastolic function could not be evaluated. 2. Right ventricular systolic function is mildly reduced. The right ventricular size is normal. Tricuspid regurgitation signal is inadequate for assessing PA pressure. 3. Left atrial size was moderately dilated. 4. Right atrial size was moderately dilated. 5. The aortic valve is tricuspid. Aortic valve regurgitation is not visualized. No aortic stenosis is present.  Comparison(s): Prior images reviewed side by side. Changes from prior study are noted. The left ventricular function is worsened. The rhythm is now atrial fibrillation with rapid ventricular response and the transmitral gradient has increased.   Neuro/Psych Seizures - (95yrs ago), Well Controlled,  Anxiety    GI/Hepatic Neg liver ROS, GERD  Controlled,  Endo/Other  negative endocrine ROS  Renal/GU negative Renal ROS     Musculoskeletal  (+) Arthritis ,    Abdominal   Peds  Hematology negative hematology ROS (+)   Anesthesia Other Findings Day of surgery medications reviewed with the patient.  Reproductive/Obstetrics                             Anesthesia Physical  Anesthesia Plan  ASA: 3  Anesthesia Plan: General   Post-op Pain Management:    Induction:   PONV Risk Score and Plan: 3 and Ondansetron, Dexamethasone and Midazolam  Airway Management Planned: LMA and Oral ETT  Additional Equipment:   Intra-op Plan:   Post-operative Plan: Extubation in OR  Informed Consent: I have reviewed the patients History and Physical, chart, labs and discussed the procedure including the risks, benefits and alternatives for the proposed anesthesia with the patient or authorized representative who has indicated his/her understanding and acceptance.     Dental advisory given  Plan Discussed with: CRNA, Anesthesiologist and Surgeon  Anesthesia Plan Comments:         Anesthesia Quick Evaluation

## 2021-09-22 NOTE — Anesthesia Procedure Notes (Signed)
Procedure Name: LMA Insertion Date/Time: 09/22/2021 9:48 PM Performed by: Clovis Cao, CRNA Pre-anesthesia Checklist: Patient identified, Emergency Drugs available, Suction available and Patient being monitored Patient Re-evaluated:Patient Re-evaluated prior to induction Oxygen Delivery Method: Circle system utilized Preoxygenation: Pre-oxygenation with 100% oxygen Induction Type: IV induction Ventilation: Mask ventilation without difficulty LMA: LMA inserted LMA Size: 4.0 Number of attempts: 1 Placement Confirmation: positive ETCO2 and breath sounds checked- equal and bilateral Tube secured with: Tape Dental Injury: Teeth and Oropharynx as per pre-operative assessment

## 2021-09-22 NOTE — Brief Op Note (Signed)
09/22/2021  10:27 PM  PATIENT:  [name]  65 y.o. male  PRE-OPERATIVE DIAGNOSIS:  Compartment Syndrome Left Leg  POST-OPERATIVE DIAGNOSIS:  Compartment Syndrome Left Leg  PROCEDURE:  Procedure(s): INCISION AND DRAINAGE WOUND WITH FASCIOTOMY (Left) APPLICATION OF WOUND VAC (Left)  SURGEON:  Surgeon(s) and Role:    Mcarthur Rossetti, MD - Primary  PHYSICIAN ASSISTANT:  Benita Stabile, PA-C  ANESTHESIA:   general  EBL:  200 mL   COUNTS:  YES  DICTATION: .Other Dictation: Dictation Number [id number]  PLAN OF CARE: Admit to inpatient   PATIENT DISPOSITION:  PACU - hemodynamically stable.   Delay start of Pharmacological VTE agent (>24hrs) due to surgical blood loss or risk of bleeding: no

## 2021-09-22 NOTE — Progress Notes (Signed)
Vascular and Interventional Radiology  On Call Note  Patient: [name] DOB: [birth year] Medical Record Number: [id number] Note Date/Time: 09/22/21 8:51 PM   Admitting Diagnosis: Knee Swelling   Lower extremity active extravasation.  I identified myself to the patient and conveyed my credentials to contacting Physician, [facility] ER MD Dr. Maryan Rued who provided Pt history. 65 y.o. year old male who presented s/p fall with concern for lower extremity extravasation.Pt with Hx of Afib on warfarin. INR 2.3 at Urgent Care.  Imaging, CTA BLE (09/22/21) reviewed independently L subcutaneous / superficial calf hematoma with focus of active extravasation.    Assessment  Plan:  Therapeutically anticoagulated with LLE superficial extremity active extravasation. - No VIR intervention at this time. - Aggressive reversal to near-normal INR - Tight compression bandage / stocking for external tamponade.   Michaelle Birks, MD [facility] Radiology   Pager. [place]

## 2021-09-22 NOTE — H&P (Signed)
[name] is an 65 y.o. male.   Chief Complaint: Severe left leg pain HPI: [name] is a 65 year old gentleman well-known to me.  I have performed surgery on him in the past.  He unfortunately injured his left leg 2 days ago when he sustained a hard mechanical fall.  He went to the emergency room yesterday and was found to have a small hematoma with his left leg.  He was then sent home.  He is on blood thinning medication and an INR of just over 2.  Around 2-3 o'clock this afternoon, he started developing worsening left leg swelling and severe left leg pain.  He went to [facility] and there was concern of an evolving compartment syndrome of his left lower extremity.  A CT scan shows no fracture but hematoma with likely active venous bleed from being on blood thinning medication.  He does report numbness with his left foot and severe pain of the left lower extremity.  Past Medical History:  Diagnosis Date   Acute on chronic systolic (congestive) heart failure (HCC) 12/30/2018   Allergy    Arthritis    Atrial flutter with rapid ventricular response (HCC)    Chronic systolic (congestive) heart failure (HCC)    Dilated aortic root ([place]) 01/04/2019   Dilated cardiomyopathy ([place]) 01/04/2019   GERD (gastroesophageal reflux disease)    history of   Heart murmur    History of colon polyps    Hypertension    Insomnia    Mitral valve prolapse    Mitral valve regurgitation    Persistent atrial fibrillation ([place]) 12/02/2018   S/P Maze operation for atrial fibrillation 03/21/2019   Complete bilateral atrial lesion set using cryothermy and bipolar radiofrequency ablation with clipping of LA appendage via right mini thoracotomy approach   S/P minimally invasive mitral valve repair 03/21/2019   Complex valvuloplasty including triangular resection of posterior leaflet, artificial Gore-tex neochord placement x4 and 36 mm Sorin Memo 4D ring annuloplasty via right mini thoracotomy approach   Seizures ([place])     had one approx. 30 yrs ago,has not had any since   Severe mitral regurgitation     Past Surgical History:  Procedure Laterality Date   ANKLE FUSION  left   Dec. 2012   ARTHROSCOPY KNEE W/ DRILLING  bilateral   2012   CARDIAC VALVE REPLACEMENT     CARDIOVERSION N/A 01/03/2019   Procedure: CARDIOVERSION;  Surgeon: Sueanne Margarita, MD;  Location: [facility];  Service: Cardiovascular;  Laterality: N/A;   CARDIOVERSION N/A 06/23/2019   Procedure: CARDIOVERSION;  Surgeon: Fay Records, MD;  Location: [facility];  Service: Cardiovascular;  Laterality: N/A;   CLIPPING OF ATRIAL APPENDAGE  03/21/2019   Procedure: Clipping Of Atrial Appendage using 59mm Atricure Pro2 Clip;  Surgeon: Rexene Alberts, MD;  Location: [facility];  Service: Open Heart Surgery;;   COLONOSCOPY     x2   FOOT ARTHRODESIS  06/23/2012   Procedure: ARTHRODESIS FOOT;  Surgeon: Wylene Simmer, MD;  Location: [facility];  Service: Orthopedics;  Laterality: Left;  Left Subtalar and Talonavicular Joint Revision Arthrodesis  Aspiration of Bone Marrow from Left Hip    HAIR TRANSPLANT     HARDWARE REMOVAL  06/23/2012   Procedure: HARDWARE REMOVAL;  Surgeon: Wylene Simmer, MD;  Location: [facility];  Service: Orthopedics;  Laterality: Left;  Removal of Deep Implant  X's 3   INSERTION OF MESH N/A 07/10/2016   Procedure: INSERTION OF MESH;  Surgeon: Coralie Keens,  MD;  Location: [facility];  Service: General;  Laterality: N/A;   JOINT REPLACEMENT     right hip  01-2011   LEFT HEART CATH AND CORONARY ANGIOGRAPHY N/A 03/16/2019   Procedure: LEFT HEART CATH AND CORONARY ANGIOGRAPHY;  Surgeon: Sherren Mocha, MD;  Location: [facility];  Service: Cardiovascular;  Laterality: N/A;   LIMB SPARING RESECTION HIP W/ SADDLE JOINT REPLACEMENT Right    MINIMALLY INVASIVE MAZE PROCEDURE N/A 03/21/2019   Procedure: MINIMALLY INVASIVE MAZE PROCEDURE;  Surgeon: Rexene Alberts, MD;  Location: [facility];  Service: Open Heart Surgery;  Laterality:  N/A;   MITRAL VALVE REPAIR Right 03/21/2019   Procedure: MINIMALLY INVASIVE MITRAL VALVE REPAIR (MVR) using Memo 4D ring size 36;  Surgeon: Rexene Alberts, MD;  Location: [facility];  Service: Open Heart Surgery;  Laterality: Right;   TEE WITHOUT CARDIOVERSION N/A 01/03/2019   Procedure: TRANSESOPHAGEAL ECHOCARDIOGRAM (TEE);  Surgeon: Sueanne Margarita, MD;  Location: [facility];  Service: Cardiovascular;  Laterality: N/A;   TEE WITHOUT CARDIOVERSION N/A 03/21/2019   Procedure: TRANSESOPHAGEAL ECHOCARDIOGRAM (TEE);  Surgeon: Rexene Alberts, MD;  Location: [facility];  Service: Open Heart Surgery;  Laterality: N/A;   TEMPORARY PACEMAKER N/A 03/22/2019   Procedure: TEMPORARY PACEMAKER;  Surgeon: Belva Crome, MD;  Location: [facility];  Service: Cardiovascular;  Laterality: N/A;   TOTAL KNEE ARTHROPLASTY Right 01/19/2014   Procedure: RIGHT TOTAL KNEE ARTHROPLASTY, Steroid injection left knee;  Surgeon: Mcarthur Rossetti, MD;  Location: [facility];  Service: Orthopedics;  Laterality: Right;   TOTAL KNEE ARTHROPLASTY Left 10/23/2014   Procedure: LEFT TOTAL KNEE ARTHROPLASTY;  Surgeon: Mcarthur Rossetti, MD;  Location: [facility];  Service: Orthopedics;  Laterality: Left;   UMBILICAL HERNIA REPAIR N/A 07/10/2016   Procedure: UMBILICAL HERNIA REPAIR;  Surgeon: Coralie Keens, MD;  Location: [facility];  Service: General;  Laterality: N/A;    Family History  Problem Relation Age of Onset   Hypertension Father    Colon cancer Father        dx in his late 61's   Diabetes Mother    Stomach cancer Neg Hx    Esophageal cancer Neg Hx    Pancreatic cancer Neg Hx    Rectal cancer Neg Hx    Social History:  reports that he has never smoked. He has never used smokeless tobacco. He reports current alcohol use of about 2.0 - 4.0 standard drinks per week. He reports that he does not use drugs.  Allergies: No Known Allergies  (Not in a hospital admission)   Results for orders placed or  performed during the hospital encounter of 09/22/21 (from the past 48 hour(s))  CBC with Differential     Status: None   Collection Time: 09/22/21  6:20 PM  Result Value Ref Range   WBC 9.0 4.0 - 10.5 K/uL   RBC 4.45 4.22 - 5.81 MIL/uL   Hemoglobin 13.9 13.0 - 17.0 g/dL   HCT 42.1 39.0 - 52.0 %   MCV 94.6 80.0 - 100.0 fL   MCH 31.2 26.0 - 34.0 pg   MCHC 33.0 30.0 - 36.0 g/dL   RDW 13.8 11.5 - 15.5 %   Platelets 180 150 - 400 K/uL   nRBC 0.0 0.0 - 0.2 %   Neutrophils Relative % 72 %   Neutro Abs 6.5 1.7 - 7.7 K/uL   Lymphocytes Relative 16 %   Lymphs Abs 1.4 0.7 - 4.0 K/uL  Monocytes Relative 10 %   Monocytes Absolute 0.9 0.1 - 1.0 K/uL   Eosinophils Relative 2 %   Eosinophils Absolute 0.1 0.0 - 0.5 K/uL   Basophils Relative 0 %   Basophils Absolute 0.0 0.0 - 0.1 K/uL   Immature Granulocytes 0 %   Abs Immature Granulocytes 0.03 0.00 - 0.07 K/uL    Comment: Performed at [facility], [address] [place], [place] [address]  Lactic acid, plasma     Status: None   Collection Time: 09/22/21  6:20 PM  Result Value Ref Range   Lactic Acid, Venous 1.7 0.5 - 1.9 mmol/L    Comment: Performed at [facility], [address] [place], [place] [address]  Protime-INR     Status: Abnormal   Collection Time: 09/22/21  6:20 PM  Result Value Ref Range   Prothrombin Time 25.6 (H) 11.4 - 15.2 seconds   INR 2.3 (H) 0.8 - 1.2    Comment: (NOTE) INR goal varies based on device and disease states. Performed at [facility], [address] [place], [place] [address]   Comprehensive metabolic panel     Status: Abnormal   Collection Time: 09/22/21  6:20 PM  Result Value Ref Range   Sodium 135 135 - 145 mmol/L   Potassium 4.2 3.5 - 5.1 mmol/L   Chloride 98 98 - 111 mmol/L   CO2 28 22 - 32 mmol/L   Glucose, Bld 97 70 - 99 mg/dL    Comment: Glucose reference range applies only to samples taken after fasting for at least 8 hours.   BUN 33 (H) 8 - 23 mg/dL    Creatinine, Ser 1.20 0.61 - 1.24 mg/dL   Calcium 9.0 8.9 - 10.3 mg/dL   Total Protein 6.2 (L) 6.5 - 8.1 g/dL   Albumin 4.1 3.5 - 5.0 g/dL   AST 23 15 - 41 U/L   ALT 26 0 - 44 U/L   Alkaline Phosphatase 42 38 - 126 U/L   Total Bilirubin 1.3 (H) 0.3 - 1.2 mg/dL   GFR, Estimated >60 >60 mL/min    Comment: (NOTE) Calculated using the CKD-EPI Creatinine Equation (2021)    Anion gap 9 5 - 15    Comment: Performed at [facility], [address] [place], [place] [address]   DG Tibia/Fibula Left  Result Date: 09/21/2021 CLINICAL DATA:  Pt had a fall last night. Hit left tibia on night stand. Bruising on left lateral tib/fib. Hx of left knee replacement and ORIF EXAM: LEFT TIBIA AND FIBULA - 2 VIEW COMPARISON:  None. FINDINGS: Status post total knee replacement. There is an intramedullary rod with screw fixation in the distal tibia. There is no evidence of fracture or dislocation none there is focal soft tissue. Severe degenerative changes in the ankle. Is swelling in the lateral aspect of the calf. IMPRESSION: No acute osseous abnormality.  Intact orthopedic hardware. Electronically Signed   By: Audie Pinto M.D.   On: 09/21/2021 11:54   CT Head Wo Contrast  Result Date: 09/21/2021 CLINICAL DATA:  08/12/2019 EXAM: CT HEAD WITHOUT CONTRAST TECHNIQUE: Contiguous axial images were obtained from the base of the skull through the vertex without intravenous contrast. COMPARISON:  08/12/2019 FINDINGS: Brain: No acute intracranial hemorrhage. No midline shift or mass effect. Gray-white differentiation maintained. Unremarkable appearance of the ventricular system. Vascular: Unremarkable. Skull: No acute fracture.  No aggressive bone lesion identified. Sinuses/Orbits: Unremarkable appearance of the  orbits. Mastoid air cells clear. No middle ear effusion. No significant sinus disease. Other: Defect within the anterior nasal septum, which can be a consequence of chronic vaso constrictive  substances. Interval resolution of the prior soft tissue swelling in the scalp IMPRESSION: Negative for acute intracranial abnormality Electronically Signed   By: Corrie Mckusick D.O.   On: 09/21/2021 11:47   CT ANGIO LOW EXTREM LEFT W &/OR WO CONTRAST  Result Date: 09/22/2021 CLINICAL DATA:  Recent leg injury with increasing swelling. EXAM: CT ANGIOGRAPHY OF THE bilateral lowerEXTREMITY TECHNIQUE: Multidetector CT imaging of the bilateral lowerwas performed using the standard protocol during bolus administration of intravenous contrast. Multiplanar CT image reconstructions and MIPs were obtained to evaluate the vascular anatomy. CONTRAST:  139mL OMNIPAQUE IOHEXOL 350 MG/ML SOLN COMPARISON:  None. FINDINGS: Evaluation is limited due to streak artifact caused by bilateral total knee arthroplasties as well as right hip arthroplasty. The common iliac arteries, internal and external iliac arteries, common femoral artery, deep and superficial femoral arteries appear patent bilateral. The popliteal arteries suboptimally evaluated due to streak artifact caused by knee arthroplasty. The visualized portions however appear patent. There is moderate atherosclerotic calcification of the calf arteries. Evaluation of the calf arteries is limited due to timing of the contrast. The calf arteries however appear patent to the level of distal calf above the ankle. There is a large subcutaneous hematoma in the posterior and lateral left calf measuring approximately 5 x 12 cm in greatest axial dimensions and 15 cm in craniocaudal length. Several foci of contrast blush within this hematoma noted consistent with active bleed. There is no acute fracture or dislocation. The bones are osteopenic. Status post prior ORIF of the distal left tibia and ankle. Bilateral total knee arthroplasties as well as right hip arthroplasty noted. The hardware are intact. There is sigmoid diverticulosis. Review of the MIP images confirms the above  findings. IMPRESSION: 1. Large subcutaneous hematoma in the posterior and lateral left calf with evidence of active bleed. 2. No acute fracture or dislocation. These results were called by telephone at the time of interpretation on 09/22/2021 at 8:11 pm to Dr. Maryan Rued who verbally acknowledged these results. Electronically Signed   By: Anner Crete M.D.   On: 09/22/2021 20:21   CT ANGIO LOW EXTREM RIGHT W &/OR WO CONTRAST  Result Date: 09/22/2021 CLINICAL DATA:  Recent leg injury with increasing swelling. EXAM: CT ANGIOGRAPHY OF THE bilateral lowerEXTREMITY TECHNIQUE: Multidetector CT imaging of the bilateral lowerwas performed using the standard protocol during bolus administration of intravenous contrast. Multiplanar CT image reconstructions and MIPs were obtained to evaluate the vascular anatomy. CONTRAST:  139mL OMNIPAQUE IOHEXOL 350 MG/ML SOLN COMPARISON:  None. FINDINGS: Evaluation is limited due to streak artifact caused by bilateral total knee arthroplasties as well as right hip arthroplasty. The common iliac arteries, internal and external iliac arteries, common femoral artery, deep and superficial femoral arteries appear patent bilateral. The popliteal arteries suboptimally evaluated due to streak artifact caused by knee arthroplasty. The visualized portions however appear patent. There is moderate atherosclerotic calcification of the calf arteries. Evaluation of the calf arteries is limited due to timing of the contrast. The calf arteries however appear patent to the level of distal calf above the ankle. There is a large subcutaneous hematoma in the posterior and lateral left calf measuring approximately 5 x 12 cm in greatest axial dimensions and 15 cm in craniocaudal length. Several foci of contrast blush within this hematoma noted consistent with active bleed. There is  no acute fracture or dislocation. The bones are osteopenic. Status post prior ORIF of the distal left tibia and ankle.  Bilateral total knee arthroplasties as well as right hip arthroplasty noted. The hardware are intact. There is sigmoid diverticulosis. Review of the MIP images confirms the above findings. IMPRESSION: 1. Large subcutaneous hematoma in the posterior and lateral left calf with evidence of active bleed. 2. No acute fracture or dislocation. These results were called by telephone at the time of interpretation on 09/22/2021 at 8:11 pm to Dr. Maryan Rued who verbally acknowledged these results. Electronically Signed   By: Anner Crete M.D.   On: 09/22/2021 20:21    Review of Systems  Blood pressure (!) 154/11, pulse (!) 126, temperature 97.9 F (36.6 C), temperature source Temporal, resp. rate 16, height 6\' 3"  (1.905 m), weight 104.3 kg, SpO2 97 %. Physical Exam Vitals reviewed.  Constitutional:      General: He is in acute distress.  Cardiovascular:     Rate and Rhythm: Tachycardia present.  Pulmonary:     Effort: Pulmonary effort is normal.     Breath sounds: Normal breath sounds.  Abdominal:     Palpations: Abdomen is soft.  Musculoskeletal:     Cervical back: Normal range of motion and neck supple.     Left lower leg: Swelling and tenderness present. Edema present.  Neurological:     Mental Status: He is alert and oriented to person, place, and time.  Psychiatric:        Behavior: Behavior normal.     When I even lift his left leg off the stretcher he screams out in pain.  His posterior compartment is tight and there is bruising and an obvious expanding hematoma.  There is pain to palpation of the lateral and medial compartments as well.  He does have a fused ankle so he cannot flex and extend the ankle.  There is a dopplerable pulse in his foot and both feet are cool from being exposed to the cold outside.  Assessment/Plan Expanding hematoma left leg with evolving compartment syndrome  I talked with Louie Casa in length in detail.  It has now been over 6 hours since he has been experiencing  severe pain.  I recommend an emergent surgery which would consist of 4 compartment fasciotomies of the left lower extremity and evacuation of the hematoma.  We will try to coagulate any type of active venous bleed.  He understands that compartment syndrome is a limb threatening condition and the need to proceed to the operating room emergently at this standpoint.  He is being given some reversal medications but at this point we need to proceed to surgery as soon as we can due to the severity of his pain and clinical exam findings consistent with compartment syndrome of the left lower extremity.  He understands that he will likely need further surgery depending on what we find this evening and potentially the need at some point to close the fasciotomy wounds.  The risks and benefits of surgery been explained in detail and informed consent is obtained.  The left leg has been marked.  Mcarthur Rossetti, MD 09/22/2021, 8:56 PM

## 2021-09-22 NOTE — Anesthesia Postprocedure Evaluation (Signed)
Anesthesia Post Note  Patient: [name]  Procedure(s) Performed: INCISION AND DRAINAGE WOUND WITH FASCIOTOMY (Left: Leg Lower) APPLICATION OF WOUND VAC (Left: Leg Lower)     Patient location during evaluation: PACU Anesthesia Type: General Level of consciousness: sedated Pain management: pain level controlled Vital Signs Assessment: post-procedure vital signs reviewed and stable Respiratory status: spontaneous breathing and respiratory function stable Cardiovascular status: stable Postop Assessment: no apparent nausea or vomiting Anesthetic complications: no   No notable events documented.  Last Vitals:  Vitals:   09/22/21 2250 09/22/21 2305  BP: (!) 157/117 (!) 143/83  Pulse: (!) 115 (!) 114  Resp: 17 18  Temp:  37.2 C  SpO2: 100% 94%    Last Pain:  Vitals:   09/22/21 2305  TempSrc:   PainSc: 0-No pain                 Glenice Ciccone DANIEL

## 2021-09-22 NOTE — ED Provider Notes (Signed)
[facility] Provider Note   CSN: [id number] Arrival date & time: 09/22/21  1657     History Chief Complaint  Patient presents with   Leg Injury    [name] is a 65 y.o. male no history of hypertension, severe mitral regurgitation, CHF, atrial fibrillation on anticoagulation, hyperlipidemia, dilated cardiomyopathy, GERD.  Patient presents after an injury to his left leg that happened about 2 days ago.  He was seen yesterday here at [facility] where he had a CT of his head and x-ray of his left leg with no acute abnormalities.  At the time he apparently had a 6 cm hematoma on his left leg.  Patient states that he was able to walk normally today, however his pain got excruciatingly worse a couple hours ago.  He states that the hematoma is gotten much larger since then.  He denies any numbness or tingling to his foot.  HPI     Past Medical History:  Diagnosis Date   Acute on chronic systolic (congestive) heart failure (HCC) 12/30/2018   Allergy    Arthritis    Atrial flutter with rapid ventricular response (HCC)    Chronic systolic (congestive) heart failure (HCC)    Dilated aortic root ([place]) 01/04/2019   Dilated cardiomyopathy ([place]) 01/04/2019   GERD (gastroesophageal reflux disease)    history of   Heart murmur    History of colon polyps    Hypertension    Insomnia    Mitral valve prolapse    Mitral valve regurgitation    Persistent atrial fibrillation ([place]) 12/02/2018   S/P Maze operation for atrial fibrillation 03/21/2019   Complete bilateral atrial lesion set using cryothermy and bipolar radiofrequency ablation with clipping of LA appendage via right mini thoracotomy approach   S/P minimally invasive mitral valve repair 03/21/2019   Complex valvuloplasty including triangular resection of posterior leaflet, artificial Gore-tex neochord placement x4 and 36 mm Sorin Memo 4D ring annuloplasty via right mini thoracotomy approach    Seizures ([place])    had one approx. 30 yrs ago,has not had any since   Severe mitral regurgitation     Patient Active Problem List   Diagnosis Date Noted   Compartment syndrome of left lower extremity ([place]) 09/22/2021   Status post surgery 09/22/2021   Hyperlipidemia 12/13/2020   Atrial fibrillation ([place]) 06/10/2020   Long term (current) use of anticoagulants 06/10/2020   Secondary hypercoagulable state ([place]) 04/24/2020   AV junctional bradycardia 03/22/2019   S/P minimally invasive mitral valve repair  03/21/2019   S/P Maze operation for atrial fibrillation 03/21/2019   Dilated aortic root ([place]) 01/04/2019   Dilated cardiomyopathy ([place]) 01/04/2019   Atrial flutter with rapid ventricular response ([place])    Mitral valve prolapse    Hypertensive urgency 12/30/2018   Acute on chronic systolic (congestive) heart failure ([place]) 33/29/5188   Acute systolic heart failure ([place]) 41/66/0630   Chronic systolic (congestive) heart failure (HCC)    Persistent atrial fibrillation ([place]) 12/02/2018   Right hamstring muscle strain 07/20/2018   Essential hypertension 02/07/2016   Severe mitral regurgitation    Annual physical exam 11/16/2014   Arthritis of left knee 10/23/2014   Status post total left knee replacement 10/23/2014   Arthritis of knee, right 01/19/2014   Status post total knee replacement 01/19/2014   Benign paroxysmal positional vertigo 10/30/2013   Testicular hypofunction 03/24/2012    Past Surgical History:  Procedure Laterality Date   ANKLE FUSION  left  Dec. 2012   ARTHROSCOPY KNEE W/ DRILLING  bilateral   2012   CARDIAC VALVE REPLACEMENT     CARDIOVERSION N/A 01/03/2019   Procedure: CARDIOVERSION;  Surgeon: Sueanne Margarita, MD;  Location: [place];  Service: Cardiovascular;  Laterality: N/A;   CARDIOVERSION N/A 06/23/2019   Procedure: CARDIOVERSION;  Surgeon: Fay Records, MD;  Location: [place];  Service: Cardiovascular;  Laterality: N/A;   CLIPPING OF ATRIAL  APPENDAGE  03/21/2019   Procedure: Clipping Of Atrial Appendage using 22mm Atricure Pro2 Clip;  Surgeon: Rexene Alberts, MD;  Location: [place];  Service: Open Heart Surgery;;   COLONOSCOPY     x2   FOOT ARTHRODESIS  06/23/2012   Procedure: ARTHRODESIS FOOT;  Surgeon: Wylene Simmer, MD;  Location: [place];  Service: Orthopedics;  Laterality: Left;  Left Subtalar and Talonavicular Joint Revision Arthrodesis  Aspiration of Bone Marrow from Left Hip    HAIR TRANSPLANT     HARDWARE REMOVAL  06/23/2012   Procedure: HARDWARE REMOVAL;  Surgeon: Wylene Simmer, MD;  Location: [place];  Service: Orthopedics;  Laterality: Left;  Removal of Deep Implant  X's 3   INSERTION OF MESH N/A 07/10/2016   Procedure: INSERTION OF MESH;  Surgeon: Coralie Keens, MD;  Location: [place];  Service: General;  Laterality: N/A;   JOINT REPLACEMENT     right hip  01-2011   LEFT HEART CATH AND CORONARY ANGIOGRAPHY N/A 03/16/2019   Procedure: LEFT HEART CATH AND CORONARY ANGIOGRAPHY;  Surgeon: Sherren Mocha, MD;  Location: [place] [facility];  Service: Cardiovascular;  Laterality: N/A;   LIMB SPARING RESECTION HIP W/ SADDLE JOINT REPLACEMENT Right    MINIMALLY INVASIVE MAZE PROCEDURE N/A 03/21/2019   Procedure: MINIMALLY INVASIVE MAZE PROCEDURE;  Surgeon: Rexene Alberts, MD;  Location: [place];  Service: Open Heart Surgery;  Laterality: N/A;   MITRAL VALVE REPAIR Right 03/21/2019   Procedure: MINIMALLY INVASIVE MITRAL VALVE REPAIR (MVR) using Memo 4D ring size 36;  Surgeon: Rexene Alberts, MD;  Location: [place];  Service: Open Heart Surgery;  Laterality: Right;   TEE WITHOUT CARDIOVERSION N/A 01/03/2019   Procedure: TRANSESOPHAGEAL ECHOCARDIOGRAM (TEE);  Surgeon: Sueanne Margarita, MD;  Location: [facility];  Service: Cardiovascular;  Laterality: N/A;   TEE WITHOUT CARDIOVERSION N/A 03/21/2019   Procedure: TRANSESOPHAGEAL ECHOCARDIOGRAM (TEE);  Surgeon: Rexene Alberts, MD;  Location: [place];  Service: Open Heart  Surgery;  Laterality: N/A;   TEMPORARY PACEMAKER N/A 03/22/2019   Procedure: TEMPORARY PACEMAKER;  Surgeon: Belva Crome, MD;  Location: [place] [facility];  Service: Cardiovascular;  Laterality: N/A;   TOTAL KNEE ARTHROPLASTY Right 01/19/2014   Procedure: RIGHT TOTAL KNEE ARTHROPLASTY, Steroid injection left knee;  Surgeon: Mcarthur Rossetti, MD;  Location: [facility];  Service: Orthopedics;  Laterality: Right;   TOTAL KNEE ARTHROPLASTY Left 10/23/2014   Procedure: LEFT TOTAL KNEE ARTHROPLASTY;  Surgeon: Mcarthur Rossetti, MD;  Location: [facility];  Service: Orthopedics;  Laterality: Left;   UMBILICAL HERNIA REPAIR N/A 07/10/2016   Procedure: UMBILICAL HERNIA REPAIR;  Surgeon: Coralie Keens, MD;  Location: [place];  Service: General;  Laterality: N/A;       Family History  Problem Relation Age of Onset   Hypertension Father    Colon cancer Father        dx in his late 64's   Diabetes Mother    Stomach cancer Neg Hx    Esophageal cancer Neg Hx  Pancreatic cancer Neg Hx    Rectal cancer Neg Hx     Social History   Tobacco Use   Smoking status: Never   Smokeless tobacco: Never  Vaping Use   Vaping Use: Never used  Substance Use Topics   Alcohol use: Yes    Alcohol/week: 2.0 - 4.0 standard drinks    Types: 1 - 2 Standard drinks or equivalent, 1 - 2 Glasses of wine per week    Comment: 1 or 2 drink daily   Drug use: No    Home Medications Prior to Admission medications   Medication Sig Start Date End Date Taking? Authorizing Provider  ALPRAZolam Duanne Moron) 0.25 MG tablet Take 0.25 mg by mouth at bedtime as needed for sleep.    [provider]  aspirin EC 81 MG EC tablet Take 1 tablet (81 mg total) by mouth daily. 03/27/19   Gold, Wayne E, PA-C  atorvastatin (LIPITOR) 20 MG tablet Take 20 mg by mouth daily.    [provider]  diltiazem 2 % GEL Apply 1 application topically 3 (three) times daily. With 5 % lidocaine 07/11/20   Willia Craze, NP  docusate sodium (COLACE) 100 MG capsule Take 300 mg by mouth daily.    [provider]  HYDROcodone-acetaminophen (NORCO/VICODIN) 5-325 MG tablet Take 1 tablet by mouth every 6 (six) hours as needed for moderate pain.    [provider]  losartan (COZAAR) 50 MG tablet Take 1 tablet by mouth once daily 09/16/21   Lorretta Harp, MD  metoprolol tartrate (LOPRESSOR) 25 MG tablet Take 1/2 (one-half) tablet by mouth twice daily 03/17/21   Lorretta Harp, MD  Multiple Vitamin (MULTIVITAMIN WITH MINERALS) TABS tablet Take 1 tablet by mouth daily.    [provider]  potassium chloride (KLOR-CON) 10 MEQ tablet TAKE 1  BY MOUTH TWICE DAILY 06/12/21   Lorretta Harp, MD  spironolactone (ALDACTONE) 25 MG tablet TAKE 1 TABLET BY MOUTH ONCE DAILY . APPOINTMENT REQUIRED FOR FUTURE REFILLS 03/05/21   Lorretta Harp, MD  torsemide (DEMADEX) 20 MG tablet Take 1 tablet (20 mg total) by mouth daily. 03/18/21   Lorretta Harp, MD  warfarin (COUMADIN) 5 MG tablet TAKE 1 TABLET BY MOUTH ONCE DAILY AS DIRECTED BY  COUMADIN  CLINIC 08/04/21   Deberah Pelton, NP    Allergies    Patient has no known allergies.  Review of Systems   Review of Systems  Constitutional:  Negative for chills and fever.  HENT:  Negative for congestion, rhinorrhea and sore throat.   Eyes:  Negative for visual disturbance.  Respiratory:  Negative for cough, chest tightness and shortness of breath.   Cardiovascular:  Negative for chest pain, palpitations and leg swelling.  Gastrointestinal:  Negative for abdominal pain, blood in stool, constipation, diarrhea, nausea and vomiting.  Genitourinary:  Negative for dysuria, flank pain and hematuria.  Musculoskeletal:  Positive for arthralgias and gait problem. Negative for back pain.  Skin:  Negative for rash and wound.  Neurological:  Negative for dizziness, syncope, weakness, light-headedness and headaches.  Psychiatric/Behavioral:  Negative for  confusion.   All other systems reviewed and are negative.  Physical Exam Updated Vital Signs BP (!) 154/11 (BP Location: Left Arm)   Pulse (!) 126   Temp 97.9 F (36.6 C) (Temporal)   Resp 16   Ht 6\' 3"  (1.905 m)   Wt 104.3 kg   SpO2 97%   BMI 28.75 kg/m  Physical Exam Vitals and nursing note reviewed.  Constitutional:      General: He is not in acute distress.    Appearance: Normal appearance. He is well-developed. He is not ill-appearing, toxic-appearing or diaphoretic.  HENT:     Head: Normocephalic and atraumatic.     Nose: No nasal deformity.     Mouth/Throat:     Lips: Pink. No lesions.  Eyes:     General: Gaze aligned appropriately. No scleral icterus.       Right eye: No discharge.        Left eye: No discharge.     Conjunctiva/sclera: Conjunctivae normal.     Right eye: Right conjunctiva is not injected. No exudate or hemorrhage.    Left eye: Left conjunctiva is not injected. No exudate or hemorrhage. Cardiovascular:     Pulses:          Dorsalis pedis pulses are 2+ on the right side and 2+ on the left side.       Posterior tibial pulses are 2+ on the right side and 2+ on the left side.  Pulmonary:     Effort: Pulmonary effort is normal. No respiratory distress.  Musculoskeletal:        General: Swelling and tenderness present.     Right lower leg: Edema present.     Left lower leg: Swelling present. Edema present.     Comments: Patient has swelling of his left lower extremity.  There is a large hematoma on the posterior calf.  Approximately 6 to 8 cm round.  Able to obtain pedal pulses with Doppler.  Cannot obtain TB pulses likely secondary to previous hardware there.Marland Kitchen  He has good sensation distal to the injury.  Calf compartments feel tense on exam.  Pain ranges from left ankle all the way to upper calf.  Lower extremity with appearing to have chronic vascular issues.  See pictures below.  Skin:    General: Skin is warm and dry.     Coloration: Skin is not  jaundiced or pale.     Findings: No bruising, erythema, lesion or rash.  Neurological:     Mental Status: He is alert and oriented to person, place, and time.  Psychiatric:        Mood and Affect: Mood normal.        Speech: Speech normal.        Behavior: Behavior normal. Behavior is cooperative.        ED Results / Procedures / Treatments   Labs (all labs ordered are listed, but only abnormal results are displayed) Labs Reviewed  PROTIME-INR - Abnormal; Notable for the following components:      Result Value   Prothrombin Time 25.6 (*)    INR 2.3 (*)    All other components within normal limits  COMPREHENSIVE METABOLIC PANEL - Abnormal; Notable for the following components:   BUN 33 (*)    Total Protein 6.2 (*)    Total Bilirubin 1.3 (*)    All other components within normal limits  RESP PANEL BY RT-PCR (FLU A&B, COVID) ARPGX2  CBC WITH DIFFERENTIAL/PLATELET  LACTIC ACID, PLASMA  LACTIC ACID, PLASMA    EKG None  Radiology DG Tibia/Fibula Left  Result Date: 09/21/2021 CLINICAL DATA:  Pt had a fall last night. Hit left tibia on night stand. Bruising on left lateral tib/fib. Hx of left knee replacement and ORIF EXAM: LEFT TIBIA AND FIBULA - 2 VIEW COMPARISON:  None. FINDINGS: Status post total knee  replacement. There is an intramedullary rod with screw fixation in the distal tibia. There is no evidence of fracture or dislocation none there is focal soft tissue. Severe degenerative changes in the ankle. Is swelling in the lateral aspect of the calf. IMPRESSION: No acute osseous abnormality.  Intact orthopedic hardware. Electronically Signed   By: Audie Pinto M.D.   On: 09/21/2021 11:54   CT Head Wo Contrast  Result Date: 09/21/2021 CLINICAL DATA:  08/12/2019 EXAM: CT HEAD WITHOUT CONTRAST TECHNIQUE: Contiguous axial images were obtained from the base of the skull through the vertex without intravenous contrast. COMPARISON:  08/12/2019 FINDINGS: Brain: No acute  intracranial hemorrhage. No midline shift or mass effect. Gray-white differentiation maintained. Unremarkable appearance of the ventricular system. Vascular: Unremarkable. Skull: No acute fracture.  No aggressive bone lesion identified. Sinuses/Orbits: Unremarkable appearance of the orbits. Mastoid air cells clear. No middle ear effusion. No significant sinus disease. Other: Defect within the anterior nasal septum, which can be a consequence of chronic vaso constrictive substances. Interval resolution of the prior soft tissue swelling in the scalp IMPRESSION: Negative for acute intracranial abnormality Electronically Signed   By: Corrie Mckusick D.O.   On: 09/21/2021 11:47   CT ANGIO LOW EXTREM LEFT W &/OR WO CONTRAST  Result Date: 09/22/2021 CLINICAL DATA:  Recent leg injury with increasing swelling. EXAM: CT ANGIOGRAPHY OF THE bilateral lowerEXTREMITY TECHNIQUE: Multidetector CT imaging of the bilateral lowerwas performed using the standard protocol during bolus administration of intravenous contrast. Multiplanar CT image reconstructions and MIPs were obtained to evaluate the vascular anatomy. CONTRAST:  135mL OMNIPAQUE IOHEXOL 350 MG/ML SOLN COMPARISON:  None. FINDINGS: Evaluation is limited due to streak artifact caused by bilateral total knee arthroplasties as well as right hip arthroplasty. The common iliac arteries, internal and external iliac arteries, common femoral artery, deep and superficial femoral arteries appear patent bilateral. The popliteal arteries suboptimally evaluated due to streak artifact caused by knee arthroplasty. The visualized portions however appear patent. There is moderate atherosclerotic calcification of the calf arteries. Evaluation of the calf arteries is limited due to timing of the contrast. The calf arteries however appear patent to the level of distal calf above the ankle. There is a large subcutaneous hematoma in the posterior and lateral left calf measuring approximately 5  x 12 cm in greatest axial dimensions and 15 cm in craniocaudal length. Several foci of contrast blush within this hematoma noted consistent with active bleed. There is no acute fracture or dislocation. The bones are osteopenic. Status post prior ORIF of the distal left tibia and ankle. Bilateral total knee arthroplasties as well as right hip arthroplasty noted. The hardware are intact. There is sigmoid diverticulosis. Review of the MIP images confirms the above findings. IMPRESSION: 1. Large subcutaneous hematoma in the posterior and lateral left calf with evidence of active bleed. 2. No acute fracture or dislocation. These results were called by telephone at the time of interpretation on 09/22/2021 at 8:11 pm to Dr. Maryan Rued who verbally acknowledged these results. Electronically Signed   By: Anner Crete M.D.   On: 09/22/2021 20:21   CT ANGIO LOW EXTREM RIGHT W &/OR WO CONTRAST  Result Date: 09/22/2021 CLINICAL DATA:  Recent leg injury with increasing swelling. EXAM: CT ANGIOGRAPHY OF THE bilateral lowerEXTREMITY TECHNIQUE: Multidetector CT imaging of the bilateral lowerwas performed using the standard protocol during bolus administration of intravenous contrast. Multiplanar CT image reconstructions and MIPs were obtained to evaluate the vascular anatomy. CONTRAST:  144mL OMNIPAQUE IOHEXOL 350 MG/ML SOLN  COMPARISON:  None. FINDINGS: Evaluation is limited due to streak artifact caused by bilateral total knee arthroplasties as well as right hip arthroplasty. The common iliac arteries, internal and external iliac arteries, common femoral artery, deep and superficial femoral arteries appear patent bilateral. The popliteal arteries suboptimally evaluated due to streak artifact caused by knee arthroplasty. The visualized portions however appear patent. There is moderate atherosclerotic calcification of the calf arteries. Evaluation of the calf arteries is limited due to timing of the contrast. The calf arteries  however appear patent to the level of distal calf above the ankle. There is a large subcutaneous hematoma in the posterior and lateral left calf measuring approximately 5 x 12 cm in greatest axial dimensions and 15 cm in craniocaudal length. Several foci of contrast blush within this hematoma noted consistent with active bleed. There is no acute fracture or dislocation. The bones are osteopenic. Status post prior ORIF of the distal left tibia and ankle. Bilateral total knee arthroplasties as well as right hip arthroplasty noted. The hardware are intact. There is sigmoid diverticulosis. Review of the MIP images confirms the above findings. IMPRESSION: 1. Large subcutaneous hematoma in the posterior and lateral left calf with evidence of active bleed. 2. No acute fracture or dislocation. These results were called by telephone at the time of interpretation on 09/22/2021 at 8:11 pm to Dr. Maryan Rued who verbally acknowledged these results. Electronically Signed   By: Anner Crete M.D.   On: 09/22/2021 20:21    Procedures .Critical Care Performed by: Adolphus Birchwood, PA-C Authorized by: Adolphus Birchwood, PA-C   Critical care provider statement:    Critical care time (minutes):  75   Critical care time was exclusive of:  Separately billable procedures and treating other patients   Critical care was necessary to treat or prevent imminent or life-threatening deterioration of the following conditions:  Trauma   Critical care was time spent personally by me on the following activities:  Blood draw for specimens, development of treatment plan with patient or surrogate, discussions with consultants, discussions with primary provider, evaluation of patient's response to treatment, examination of patient, obtaining history from patient or surrogate, review of old charts, re-evaluation of patient's condition, pulse oximetry, ordering and review of radiographic studies, ordering and review of laboratory studies and  ordering and performing treatments and interventions   Care discussed with: admitting provider     Medications Ordered in ED Medications  fentaNYL (SUBLIMAZE) injection 25-50 mcg (has no administration in time range)  promethazine (PHENERGAN) injection 6.25-12.5 mg (has no administration in time range)  amisulpride (BARHEMSYS) injection 10 mg (has no administration in time range)  oxyCODONE-acetaminophen (PERCOCET/ROXICET) 5-325 MG per tablet 1 tablet (1 tablet Oral Given 09/22/21 1758)  HYDROmorphone (DILAUDID) injection 1 mg (1 mg Intramuscular Given 09/22/21 1818)  phytonadione (VITAMIN K) 10 mg in dextrose 5 % 50 mL IVPB (10 mg Intravenous New Bag/Given 09/22/21 2100)  iohexol (OMNIPAQUE) 350 MG/ML injection 125 mL (125 mLs Intravenous Contrast Given 09/22/21 1908)  prothrombin complex conc human (KCENTRA) IVPB 1,520 Units (1,520 Units Intravenous New Bag/Given 09/22/21 2053)  HYDROmorphone (DILAUDID) injection 1 mg (1 mg Intravenous Given 09/22/21 2059)    ED Course  I have reviewed the triage vital signs and the nursing notes.  Pertinent labs & imaging results that were available during my care of the patient were reviewed by me and considered in my medical decision making (see chart for details).  Clinical Course as of 09/22/21 2226  [facility]  21, 2022  1822 Pain out of proportion to exam. Large hematoma to posterior calf. DP pulses found, TB pulses not found, likely 2/2 to hardware in ankle. Concern for developing compartment syndrome 2/2 large hematoma. Ortho paged.  [GL]  3790 Spoke to Dr. Jonni Sanger. Recommends calling Dr. Sharol Given and [facility] group since they did previous knee and ankle surgery [GL]  1905 Dr Ninfa Linden recs reversal of coumadin and transfer to [facility] [GL]  1908 Spoke to Dr. Theora Gianotti who will be accepting provider [GL]    Clinical Course User Index [GL] Ladine Kiper, Adora Fridge, PA-C   MDM Rules/Calculators/A&P                          This is an ill-appearing 65 year old  male presents the emergency department with continued left leg hematoma and pain.  Apparently he had a fall last Saturday and developed a small hematoma on his left leg.  He is on warfarin at home.  His swelling got significantly worse and he was seen in the emergency department yesterday.  He got CT head and x-ray of his left tibia-fibula.  These both returned negative.  Hematoma was approximately 6 cm yesterday.  Apparently patient was doing okay today and was weightbearing on his left leg when it became suddenly unbearable.  He says that the hematoma has gotten much larger.  He is in excruciating pain and unable to ambulate. Exam is concerning for a large hematoma to the left calf approximately 8 to 10 cm.  Patient is extremely tender to touch with pain out of proportion to exam.  Pain ranges from left ankle all the way up to the left knee.  He has associated swelling and tense compartments.  Unable to palpate pulse of left tibial or dorsalis pedis.  We were able to Doppler these with ultrasound.  @1844  I spoke with Dr. Lynann Bologna from orthopedic surgery who recommended getting CTA imaging, however since the patient had previous ankle and knee replacements with Dr. Sharol Given and Ninfa Linden, he recommends calling them for further management.  @1905 , spoke with Dr. Ninfa Linden.  He recommends reversing Coumadin prior to transfer to [facility] emergency department.  He will evaluate patient once he gets there.  - Warfarin reversal with Kcentra and vitamin K.  @1908 , spoke with Dr. Maryan Rued from [facility] emergency department who will be excepting provider upon transfer.  Upon reviewing all labs, CBC unremarkable with normal hemoglobin.  Lactic acid normal.  CMP unremarkable.  INR is 2.3 with PT of 25.6   Upon arrival to [facility] ED, please call Dr. Ninfa Linden to see him in the ED.   Final Clinical Impression(s) / ED Diagnoses Final diagnoses:  Compartment syndrome of left lower extremity [facility])    Rx / DC  Orders ED Discharge Orders     None        Adolphus Birchwood, PA-C 09/22/21 2226    Teressa Lower, MD 09/23/21 0006

## 2021-09-22 NOTE — Transfer of Care (Signed)
Immediate Anesthesia Transfer of Care Note  Patient: [name]  Procedure(s) Performed: INCISION AND DRAINAGE WOUND WITH FASCIOTOMY (Left: Leg Lower) APPLICATION OF WOUND VAC (Left: Leg Lower)  Patient Location: PACU  Anesthesia Type:General  Level of Consciousness: awake  Airway & Oxygen Therapy: Patient Spontanous Breathing  Post-op Assessment: Report given to RN and Post -op Vital signs reviewed and stable  Post vital signs: Reviewed and stable  Last Vitals:  Vitals Value Taken Time  BP 152/102 09/22/21 2234  Temp    Pulse 113 09/22/21 2235  Resp 16 09/22/21 2235  SpO2 95 % 09/22/21 2235  Vitals shown include unvalidated device data.  Last Pain:  Vitals:   09/22/21 2038  TempSrc: Temporal  PainSc:          Complications: No notable events documented.

## 2021-09-22 NOTE — ED Notes (Signed)
Patient transported to CT 

## 2021-09-22 NOTE — ED Triage Notes (Signed)
Return visit , leg injury x 2 day ago , return today with increased leg swelling x 3 hrs , pt is on blood thinners

## 2021-09-22 NOTE — ED Notes (Signed)
Pt arrived via [facility], c/o intermittent pain to LLE, +pulse to left foot. A&Ox4.

## 2021-09-22 NOTE — ED Notes (Signed)
ED Provider at bedside. 

## 2021-09-23 ENCOUNTER — Encounter (HOSPITAL_COMMUNITY)
Admission: EM | Disposition: A | Discharge status: Home / Self Care | Payer: Self-pay | Source: Home / Self Care | Attending: Orthopaedic Surgery

## 2021-09-23 ENCOUNTER — Encounter (HOSPITAL_COMMUNITY): Payer: Self-pay | Admitting: Orthopaedic Surgery

## 2021-09-23 ENCOUNTER — Inpatient Hospital Stay (HOSPITAL_COMMUNITY): Payer: Medicare HMO | Admitting: Certified Registered Nurse Anesthetist

## 2021-09-23 DIAGNOSIS — T79A22D Traumatic compartment syndrome of left lower extremity, subsequent encounter: Secondary | ICD-10-CM | POA: Diagnosis not present

## 2021-09-23 HISTORY — PX: I & D EXTREMITY: SHX5045

## 2021-09-23 LAB — CBC
HCT: 33.6 % — ABNORMAL LOW (ref 39.0–52.0)
Hemoglobin: 11.2 g/dL — ABNORMAL LOW (ref 13.0–17.0)
MCH: 31.3 pg (ref 26.0–34.0)
MCHC: 33.3 g/dL (ref 30.0–36.0)
MCV: 93.9 fL (ref 80.0–100.0)
Platelets: 157 10*3/uL (ref 150–400)
RBC: 3.58 MIL/uL — ABNORMAL LOW (ref 4.22–5.81)
RDW: 13.8 % (ref 11.5–15.5)
WBC: 12.1 10*3/uL — ABNORMAL HIGH (ref 4.0–10.5)
nRBC: 0 % (ref 0.0–0.2)

## 2021-09-23 LAB — BASIC METABOLIC PANEL
Anion gap: 11 (ref 5–15)
BUN: 22 mg/dL (ref 8–23)
CO2: 24 mmol/L (ref 22–32)
Calcium: 8.2 mg/dL — ABNORMAL LOW (ref 8.9–10.3)
Chloride: 98 mmol/L (ref 98–111)
Creatinine, Ser: 1.02 mg/dL (ref 0.61–1.24)
GFR, Estimated: >60 mL/min (ref >60)
Glucose, Bld: 128 mg/dL — ABNORMAL HIGH (ref 70–99)
Potassium: 3.9 mmol/L (ref 3.5–5.1)
Sodium: 133 mmol/L — ABNORMAL LOW (ref 135–145)

## 2021-09-23 LAB — LACTIC ACID, PLASMA: Lactic Acid, Venous: 1.8 mmol/L (ref 0.5–1.9)

## 2021-09-23 LAB — PROTIME-INR
INR: 1.3 — ABNORMAL HIGH (ref 0.8–1.2)
Prothrombin Time: 15.7 seconds — ABNORMAL HIGH (ref 11.4–15.2)

## 2021-09-23 LAB — RESP PANEL BY RT-PCR (FLU A&B, COVID) ARPGX2
Influenza A by PCR: NEGATIVE
Influenza B by PCR: NEGATIVE
SARS Coronavirus 2 by RT PCR: NEGATIVE

## 2021-09-23 SURGERY — IRRIGATION AND DEBRIDEMENT EXTREMITY
Anesthesia: General | Site: Leg Lower | Laterality: Left

## 2021-09-23 MED ORDER — OXYCODONE HCL 5 MG/5ML PO SOLN: 5.0000 mg | Freq: Once | ORAL | Status: DC | PRN

## 2021-09-23 MED ORDER — ORAL CARE MOUTH RINSE: 15.0000 mL | Freq: Once | OROMUCOSAL | Status: DC

## 2021-09-23 MED ORDER — MIDAZOLAM HCL 2 MG/2ML IJ SOLN
INTRAMUSCULAR | Status: AC
  Filled 2021-09-23: qty 2

## 2021-09-23 MED ORDER — TRANEXAMIC ACID-NACL 1000-0.7 MG/100ML-% IV SOLN
INTRAVENOUS | Status: AC
  Filled 2021-09-23: qty 100

## 2021-09-23 MED ORDER — PHENYLEPHRINE 40 MCG/ML (10ML) SYRINGE FOR IV PUSH (FOR BLOOD PRESSURE SUPPORT)
PREFILLED_SYRINGE | INTRAVENOUS | Status: DC | PRN
  Administered 2021-09-23: 120 ug via INTRAVENOUS
  Administered 2021-09-23: 80 ug via INTRAVENOUS

## 2021-09-23 MED ORDER — PROMETHAZINE HCL 25 MG/ML IJ SOLN: 6.2500 mg | INTRAMUSCULAR | Status: DC | PRN

## 2021-09-23 MED ORDER — AMISULPRIDE (ANTIEMETIC) 5 MG/2ML IV SOLN: 10.0000 mg | Freq: Once | INTRAVENOUS | Status: DC | PRN

## 2021-09-23 MED ORDER — LIDOCAINE 2% (20 MG/ML) 5 ML SYRINGE
INTRAMUSCULAR | Status: DC | PRN
  Administered 2021-09-23: 60 mg via INTRAVENOUS

## 2021-09-23 MED ORDER — TRANEXAMIC ACID-NACL 1000-0.7 MG/100ML-% IV SOLN
INTRAVENOUS | Status: DC | PRN
  Administered 2021-09-23: 1000 mg via INTRAVENOUS

## 2021-09-23 MED ORDER — CEFAZOLIN SODIUM-DEXTROSE 2-4 GM/100ML-% IV SOLN
2.0000 g | Freq: Three times a day (TID) | INTRAVENOUS | Status: DC
  Administered 2021-09-23 – 2021-09-28 (×15): 2 g via INTRAVENOUS
  Filled 2021-09-23 (×15): qty 100

## 2021-09-23 MED ORDER — PROPOFOL 10 MG/ML IV BOLUS
INTRAVENOUS | Status: AC
  Filled 2021-09-23: qty 20

## 2021-09-23 MED ORDER — FENTANYL CITRATE (PF) 100 MCG/2ML IJ SOLN
INTRAMUSCULAR | Status: DC | PRN
  Administered 2021-09-23 (×7): 25 ug via INTRAVENOUS

## 2021-09-23 MED ORDER — FENTANYL CITRATE (PF) 250 MCG/5ML IJ SOLN
INTRAMUSCULAR | Status: AC
  Filled 2021-09-23: qty 5

## 2021-09-23 MED ORDER — HYDROMORPHONE HCL 1 MG/ML IJ SOLN
INTRAMUSCULAR | Status: AC
  Filled 2021-09-23: qty 1

## 2021-09-23 MED ORDER — DEXAMETHASONE SODIUM PHOSPHATE 10 MG/ML IJ SOLN
INTRAMUSCULAR | Status: DC | PRN
  Administered 2021-09-23: 10 mg via INTRAVENOUS

## 2021-09-23 MED ORDER — ONDANSETRON HCL 4 MG/2ML IJ SOLN
INTRAMUSCULAR | Status: DC | PRN
  Administered 2021-09-23: 4 mg via INTRAVENOUS

## 2021-09-23 MED ORDER — OXYCODONE HCL 5 MG PO TABS: 5.0000 mg | ORAL_TABLET | Freq: Once | ORAL | Status: DC | PRN

## 2021-09-23 MED ORDER — MIDAZOLAM HCL 5 MG/5ML IJ SOLN
INTRAMUSCULAR | Status: DC | PRN
  Administered 2021-09-23: 2 mg via INTRAVENOUS

## 2021-09-23 MED ORDER — HYDROMORPHONE HCL 1 MG/ML IJ SOLN
0.2500 mg | INTRAMUSCULAR | Status: DC | PRN
  Administered 2021-09-23 (×3): 0.5 mg via INTRAVENOUS

## 2021-09-23 MED ORDER — PROPOFOL 10 MG/ML IV BOLUS
INTRAVENOUS | Status: DC | PRN
  Administered 2021-09-23: 150 mg via INTRAVENOUS

## 2021-09-23 MED ORDER — 0.9 % SODIUM CHLORIDE (POUR BTL) OPTIME
TOPICAL | Status: DC | PRN
  Administered 2021-09-23: 1000 mL

## 2021-09-23 MED ORDER — CHLORHEXIDINE GLUCONATE 0.12 % MT SOLN
OROMUCOSAL | Status: AC
  Administered 2021-09-23: 15 mL
  Filled 2021-09-23: qty 15

## 2021-09-23 MED ORDER — LACTATED RINGERS IV SOLN: INTRAVENOUS | Status: DC

## 2021-09-23 MED ORDER — CHLORHEXIDINE GLUCONATE 0.12 % MT SOLN: 15.0000 mL | Freq: Once | OROMUCOSAL | Status: DC

## 2021-09-23 SURGICAL SUPPLY — 54 items
BAG COUNTER SPONGE SURGICOUNT (BAG) ×2 IMPLANT
BAG SURGICOUNT SPONGE COUNTING (BAG) ×1
BLADE SURG 10 STRL SS (BLADE) ×3 IMPLANT
BNDG COHESIVE 4X5 TAN STRL (GAUZE/BANDAGES/DRESSINGS) ×3 IMPLANT
BNDG COHESIVE 6X5 TAN STRL LF (GAUZE/BANDAGES/DRESSINGS) ×6 IMPLANT
BNDG ELASTIC 3X5.8 VLCR STR LF (GAUZE/BANDAGES/DRESSINGS) ×3 IMPLANT
CANISTER WOUNDNEG PRESSURE 500 (CANNISTER) ×3 IMPLANT
COVER SURGICAL LIGHT HANDLE (MISCELLANEOUS) ×3 IMPLANT
CUFF TOURN SGL QUICK 18X4 (TOURNIQUET CUFF) ×3 IMPLANT
CUFF TOURN SGL QUICK 34 (TOURNIQUET CUFF)
CUFF TRNQT CYL 34X4.125X (TOURNIQUET CUFF) IMPLANT
DRAPE INCISE IOBAN 66X45 STRL (DRAPES) ×6 IMPLANT
DRAPE ORTHO SPLIT 77X108 STRL (DRAPES) ×6
DRAPE SURG 17X23 STRL (DRAPES) IMPLANT
DRAPE SURG ORHT 6 SPLT 77X108 (DRAPES) ×2 IMPLANT
DRAPE U-SHAPE 47X51 STRL (DRAPES) ×3 IMPLANT
DRSG VAC ATS LRG SENSATRAC (GAUZE/BANDAGES/DRESSINGS) ×3 IMPLANT
DURAPREP 26ML APPLICATOR (WOUND CARE) ×3 IMPLANT
ELECT REM PT RETURN 9FT ADLT (ELECTROSURGICAL)
ELECTRODE REM PT RTRN 9FT ADLT (ELECTROSURGICAL) IMPLANT
GAUZE SPONGE 4X4 12PLY STRL (GAUZE/BANDAGES/DRESSINGS) ×3 IMPLANT
GAUZE XEROFORM 1X8 LF (GAUZE/BANDAGES/DRESSINGS) ×3 IMPLANT
GAUZE XEROFORM 5X9 LF (GAUZE/BANDAGES/DRESSINGS) ×3 IMPLANT
GLOVE SRG 8 PF TXTR STRL LF DI (GLOVE) ×2 IMPLANT
GLOVE SURG ENC MOIS LTX SZ8 (GLOVE) ×3 IMPLANT
GLOVE SURG ORTHO LTX SZ7.5 (GLOVE) ×3 IMPLANT
GLOVE SURG UNDER POLY LF SZ8 (GLOVE) ×6
GOWN STRL REUS W/ TWL LRG LVL3 (GOWN DISPOSABLE) ×1 IMPLANT
GOWN STRL REUS W/ TWL XL LVL3 (GOWN DISPOSABLE) ×2 IMPLANT
GOWN STRL REUS W/TWL LRG LVL3 (GOWN DISPOSABLE) ×3
GOWN STRL REUS W/TWL XL LVL3 (GOWN DISPOSABLE) ×6
HANDPIECE INTERPULSE COAX TIP (DISPOSABLE)
KIT BASIN OR (CUSTOM PROCEDURE TRAY) ×3 IMPLANT
KIT TURNOVER KIT B (KITS) ×3 IMPLANT
MANIFOLD NEPTUNE II (INSTRUMENTS) ×3 IMPLANT
NS IRRIG 1000ML POUR BTL (IV SOLUTION) ×3 IMPLANT
PACK ORTHO EXTREMITY (CUSTOM PROCEDURE TRAY) ×3 IMPLANT
PAD ARMBOARD 7.5X6 YLW CONV (MISCELLANEOUS) ×6 IMPLANT
PADDING CAST ABS 4INX4YD NS (CAST SUPPLIES) ×4
PADDING CAST ABS COTTON 4X4 ST (CAST SUPPLIES) ×2 IMPLANT
PADDING CAST COTTON 6X4 STRL (CAST SUPPLIES) ×3 IMPLANT
SET HNDPC FAN SPRY TIP SCT (DISPOSABLE) IMPLANT
SPONGE T-LAP 18X18 ~~LOC~~+RFID (SPONGE) ×3 IMPLANT
STOCKINETTE IMPERVIOUS 9X36 MD (GAUZE/BANDAGES/DRESSINGS) ×3 IMPLANT
SUT ETHILON 2 0 FS 18 (SUTURE) IMPLANT
SUT ETHILON 2 0 PSLX (SUTURE) ×6 IMPLANT
SUT ETHILON 3 0 PS 1 (SUTURE) IMPLANT
SWAB CULTURE ESWAB REG 1ML (MISCELLANEOUS) IMPLANT
TOWEL GREEN STERILE (TOWEL DISPOSABLE) ×3 IMPLANT
TOWEL GREEN STERILE FF (TOWEL DISPOSABLE) ×3 IMPLANT
TUBE CONNECTING 12'X1/4 (SUCTIONS) ×1
TUBE CONNECTING 12X1/4 (SUCTIONS) ×2 IMPLANT
UNDERPAD 30X36 HEAVY ABSORB (UNDERPADS AND DIAPERS) ×3 IMPLANT
YANKAUER SUCT BULB TIP NO VENT (SUCTIONS) ×3 IMPLANT

## 2021-09-23 NOTE — Anesthesia Postprocedure Evaluation (Signed)
Anesthesia Post Note  Patient: [name]  Procedure(s) Performed: IRRIGATION AND DEBRIDEMENT OF LEG (Left: Leg Lower)     Patient location during evaluation: PACU Anesthesia Type: General Level of consciousness: awake and alert and oriented Pain management: pain level controlled Vital Signs Assessment: post-procedure vital signs reviewed and stable Respiratory status: spontaneous breathing, nonlabored ventilation and respiratory function stable Cardiovascular status: blood pressure returned to baseline and stable Postop Assessment: no apparent nausea or vomiting Anesthetic complications: no   No notable events documented.  Last Vitals:  Vitals:   09/23/21 1220 09/23/21 1225  BP:  104/77  Pulse: (!) 118 (!) 119  Resp: 13 (!) 25  Temp:  36.9 C  SpO2: 100% 100%    Last Pain:  Vitals:   09/23/21 1225  TempSrc:   PainSc: 6                  Evalise Abruzzese A.

## 2021-09-23 NOTE — Plan of Care (Signed)

## 2021-09-23 NOTE — Brief Op Note (Signed)
09/23/2021  11:42 AM  PATIENT:  [name]  64 y.o. male  PRE-OPERATIVE DIAGNOSIS:  compartment syndrome left leg  POST-OPERATIVE DIAGNOSIS:  compartment syndrome left leg  PROCEDURE:  Evacuation of hematoma left leg with VAC sponge placement  SURGEON:  Surgeon(s) and Role:    Mcarthur Rossetti, MD - Primary  ANESTHESIA:   general  EBL:  20 mL   COUNTS:  YES  TOURNIQUET:  * No tourniquets in log *  DICTATION: .Other Dictation: Dictation Number [id number]  PLAN OF CARE: Admit to inpatient   PATIENT DISPOSITION:  PACU - hemodynamically stable.   Delay start of Pharmacological VTE agent (>24hrs) due to surgical blood loss or risk of bleeding: no

## 2021-09-23 NOTE — Progress Notes (Signed)
Patient ID: [name], male   DOB: 11-Nov-1955, 65 y.o.   MRN: [id number] The patient is awake and alert this morning.  His vital signs are stable.  He is more comfortable overall.  However, his left leg is swollen.  The back is putting out fluid but I am concerned about how swollen the posterior aspect of his leg is.  I have talked to him in length.  I believe we need a return trip to the operating room later today to evacuate more hematoma from his leg and to irrigate the muscles.  He states he understands this fully.  I will make him n.p.o. for now.

## 2021-09-23 NOTE — Plan of Care (Signed)
  Problem: Education: Goal: Knowledge of General Education information will improve Description: Including pain rating scale, medication(s)/side effects and non-pharmacologic comfort measures 09/23/2021 0141 by Guinevere Scarlet, RN Outcome: Progressing 09/23/2021 0140 by Guinevere Scarlet, RN Outcome: Progressing   Problem: Health Behavior/Discharge Planning: Goal: Ability to manage health-related needs will improve 09/23/2021 0141 by Guinevere Scarlet, RN Outcome: Progressing 09/23/2021 0140 by Guinevere Scarlet, RN Outcome: Progressing   Problem: Clinical Measurements: Goal: Ability to maintain clinical measurements within normal limits will improve 09/23/2021 0141 by Guinevere Scarlet, RN Outcome: Progressing 09/23/2021 0140 by Guinevere Scarlet, RN Outcome: Progressing Goal: Will remain free from infection Outcome: Progressing

## 2021-09-23 NOTE — Op Note (Signed)
NAMEERIEL, DOYON MEDICAL RECORD NO: [id number] ACCOUNT NO: [id number] DATE OF BIRTH: [birth year] FACILITY: [facility] LOCATION: [facility] PHYSICIAN: Lind Guest. Ninfa Linden, MD  Operative Report   DATE OF PROCEDURE: 09/22/2021  PREOPERATIVE DIAGNOSIS:  Evolving compartment syndrome, left lower extremity.  POSTOPERATIVE DIAGNOSIS:  Evolving compartment syndrome, left lower extremity.  PROCEDURES: 1.  Four-compartment fasciotomy, left lower extremity. 2.  Incisional wound VAC, left lower extremity.  FINDINGS:  Large hematoma of lateral and posterior compartment, left leg with venous bleeding on someone who is anticoagulated.  SURGEON:  Jean Rosenthal, M.D.  ASSISTANT:  Benita Stabile, PA-C.  ANESTHESIA:  General via LMA.  ANTIBIOTICS:  2 g IV Ancef.  BLOOD LOSS:  100 mL.  COMPLICATIONS:  None.  INDICATIONS:  Clemente is a 65 year old gentleman well known to me.  He is on Coumadin for chronic AFib. Two days ago, he sustained a mechanical fall and really hit his left leg very hard.  This caused a bruise and a hematoma with no fracture.  He was seen  at [facility] yesterday and they obtained a CT scan of his head because he hit his head and they x-rayed the left leg.  The left leg did not really hurt him bad and x-rays were negative for fracture.  About 02:00 this afternoon, he started  developing worsening left leg pain and swelling.  This evolved to the point where he returned to [facility] around 05:00 p.m.  They felt that this was concerning for an evolving compartment syndrome.  A CTA was obtained showing just likely  venous bleeding within a hematoma that was large and the superficial tissues and deep tissues around the lateral and posterior  lower leg.  They called me as a consultation since I had seen him before and I was on call.  I recommended emergent transport  to the [facility] for assessment.  In the emergency room over at [facility], he definitely has a  significant amount of pain out of proportion to the exam with significant left leg swelling.  I have recommended a 4-compartment fasciotomy with evacuation  of hematoma.  I explained in detail why this needs to be done.  He is being reversed on his Coumadin and we do need to proceed to the operating room urgently because this has now been evolving for about 6 to 7 hours now.  I did talk about the risk of  finding the dead muscle we have to remove as well as the potential for multiple surgeries.  DESCRIPTION OF PROCEDURE:  After informed consent was obtained, appropriate left leg was marked, he was brought to the operating room, was placed supine on the operating table.  General anesthesia was then obtained via LMA.  His left thigh, knee, leg,  and ankle were prepped and draped with DuraPrep including a sterile stockinette.  A timeout was called and he was identified as the correct patient, correct left leg.  I then made a lateral based incision and carried this proximally and distally quite  widely.  I was able through this lateral incision find an incredibly large hematoma.  There was venous bleeding that we coagulated.  We released the anterior and lateral compartments through this incision. I was able to actually get to the posterior  compartment and released the posterior compartment all through the lateral based incision.  Again, this was a very wide incision.  All muscle groups that we found were contractile fortunately.  Once we evacuated a  large hematoma and dried the leg real  well, we did find the venous bleeding that we coagulated.  We then irrigated the leg with normal saline solution and dried it real well.  It had been so swollen, the skin easily closed, so we decided to go ahead and reapproximate the skin very loosely  with interrupted 2-0 nylon suture and then placed an incisional VAC over this.  He was awakened, extubated, and taken to recovery room in stable condition.  Postoperatively, we  will have him admitted and we will keep him off anticoagulation for the next  48-72 hours with a reassessment of his wound tomorrow.  Hopefully, we can leave the incisional VAC on for 2 to 3 days and then can discharge him to home.   Of note, Benita Stabile, PA-C did assist in entire case and assistance was crucial for facilitating all aspects of this case.   MUK D: 09/22/2021 10:24:03 pm T: 09/23/2021 12:20:00 am  JOB: [id number]/ [id number]

## 2021-09-23 NOTE — Progress Notes (Signed)
Patient ID: [name], male   DOB: November 10, 1955, 65 y.o.   MRN: [id number] The swelling in his left leg and the pain he is having has increased from when I saw him earlier this morning.  At this point I do feel it is urgent to emergent that we proceed with surgery to evacuate the hematoma from the left leg due to continued potential for compartment syndrome.

## 2021-09-23 NOTE — Anesthesia Preprocedure Evaluation (Addendum)
Anesthesia Evaluation  Patient identified by MRN, date of birth, ID band Patient awake    Reviewed: Allergy & Precautions, H&P , NPO status , Patient's Chart, lab work & pertinent test results, reviewed documented beta blocker date and time   History of Anesthesia Complications Negative for: history of anesthetic complications  Airway Mallampati: II  TM Distance: >3 FB Neck ROM: Full    Dental  (+) Dental Advisory Given, Chipped,    Pulmonary neg pulmonary ROS,    Pulmonary exam normal breath sounds clear to auscultation       Cardiovascular hypertension, Pt. on medications +CHF  negative cardio ROS Normal cardiovascular examAtrial Fibrillation + Valvular Problems/Murmurs MR  Rate:Normal  S/P MVR TEE 1. Left ventricular ejection fraction, by estimation, is 45 to 50%. The left ventricle has mildly decreased function. The left ventricle has no regional wall motion abnormalities. There is mild concentric left ventricular hypertrophy. Left ventricular  diastolic function could not be evaluated. 2. Right ventricular systolic function is mildly reduced. The right ventricular size is normal. Tricuspid regurgitation signal is inadequate for assessing PA pressure. 3. Left atrial size was moderately dilated. 4. Right atrial size was moderately dilated. 5. The aortic valve is tricuspid. Aortic valve regurgitation is not visualized. No aortic stenosis is present.  Comparison(s): Prior images reviewed side by side. Changes from prior study are noted. The left ventricular function is worsened. The rhythm is now atrial fibrillation with rapid ventricular response and the transmitral gradient has increased.   Neuro/Psych Seizures -, Well Controlled,  Anxiety negative psych ROS   GI/Hepatic Neg liver ROS, GERD  Controlled,  Endo/Other  negative endocrine ROS  Renal/GU negative Renal ROS  negative genitourinary    Musculoskeletal negative musculoskeletal ROS (+)   Abdominal   Peds negative pediatric ROS (+)  Hematology negative hematology ROS (+)   Anesthesia Other Findings Day of surgery medications reviewed with the patient.  Reproductive/Obstetrics negative OB ROS                             Anesthesia Physical  Anesthesia Plan  ASA: 3  Anesthesia Plan: General   Post-op Pain Management:    Induction: Intravenous, Rapid sequence and Cricoid pressure planned  PONV Risk Score and Plan: 3 and Ondansetron, Dexamethasone, Midazolam and Treatment may vary due to age or medical condition  Airway Management Planned: LMA  Additional Equipment:   Intra-op Plan:   Post-operative Plan: Extubation in OR  Informed Consent: I have reviewed the patients History and Physical, chart, labs and discussed the procedure including the risks, benefits and alternatives for the proposed anesthesia with the patient or authorized representative who has indicated his/her understanding and acceptance.     Dental advisory given  Plan Discussed with: CRNA, Anesthesiologist and Surgeon  Anesthesia Plan Comments:        Anesthesia Quick Evaluation

## 2021-09-23 NOTE — Progress Notes (Signed)
Patient ID: [name], male   DOB: 17-May-1956, 65 y.o.   MRN: [id number] I have talked to [place].  I think it is more urgent that we proceed to surgery later this morning to evacuate hematoma from his left leg again and likely place a new VAC and keep the wound open to allow for more drainage.  He agrees with this as well.

## 2021-09-23 NOTE — Op Note (Signed)
NAMEMAXIMO, SPRATLING MEDICAL RECORD NO: [id number] ACCOUNT NO: [id number] DATE OF BIRTH: 06/29/1956 FACILITY: [facility] LOCATION: [facility] PHYSICIAN: Lind Guest. Ninfa Linden, MD  Operative Report   DATE OF PROCEDURE: 09/23/2021  PREOPERATIVE DIAGNOSIS:  Left lower extremity expanding hematoma, status post 4-compartment fasciotomy.  POSTOPERATIVE DIAGNOSIS:  Left lower extremity expanding hematoma, status post 4-compartment fasciotomy.  PROCEDURE: 1.  Repeat evacuation of hematoma with incision and drainage of blood clotting of the left lower extremity from previous trauma. 2.  VAC sponge placement, left lower extremity lateral fasciotomy wound.  SURGEON:  Lind Guest. Ninfa Linden, MD  ANESTHESIA:  General.  ESTIMATED BLOOD LOSS:  Less than 200 mL.  COMPLICATIONS:  None.  INDICATIONS:  The patient is a 65 year old gentleman who I took to the operating room last evening emergently for fasciotomies of the left lower extremity.  He has been on blood thinning medication and 2 days before last night surgery had sustained a  significant fall and a contusion to his left leg.  He was initially seen in the emergency room and had only a small hematoma.  He has felt comfortable until around 2:00 p.m. this afternoon, he developed significant leg swelling and pain and it was  severe.  He had indications for compartment syndrome.  He was transferred from [facility] and was taken to the operating room emergently last night and made a wide extensile extension laterally.  We had obtained a CT scan that  showed that the hematoma was all lateral and posterior.  Through the lateral incision, I was able to open the anterior and lateral compartments and actually get underneath the leg to the posterior and released all that finding a large hematoma.  All the  muscles were contractile.  He had such significant swelling before surgery once I evacuated everything.  His leg was very skinny  and the skin was able to easily be close.  I actually closed the lateral incision, but very loosely and place a VAC over top  of this to allow for drainage.  This morning, he had developed more swelling again and I was concerned about the extent of the hematoma due to his pain, worsening once again.  I felt it was appropriate to take him back to the operating room for  reexploration of the compartments and to try to coagulate any bleeders.  I did explain this to him in detail and he does agree with proceeding back to the surgery today.  DESCRIPTION OF PROCEDURE:  After informed consent was obtained, appropriate left leg was marked.  Again, he was brought to the operating room and placed supine on the operating table.  General anesthesia was then obtained.  I then gave him a gram of TXA.   I then opened up the incision laterally and found another large hematoma.  I evacuated this using suction and then thoroughly irrigated the lateral aspect of the leg.  All the muscle groups were fortunately contractile again.  I did find some continued  small venous bleeding that I coagulated.  I then decided to go ahead and make a medial incision to see if there is anything on the medial side that I could evacuate.  The compartments were released and I make sure I scored posterior medial and did not  find any worrisome findings.  The muscle was all contractile as well in this area.  I then irrigated that wound and loosely reapproximated the medial incision with interrupted 2-0 nylon.  I then  placed a VAC sponge over this for incisional VAC.  We then  went back to the lateral side.  At this time, I did not close that wound on the lateral side.  I am worried about the posterior skin and was able to put a large VAC sponge over the lateral aspect of his leg and then some Xeroform over the skin area and  put a VAC to suction and had a good seal from the medial and lateral incisions.  The leg was nice and soft as well and again  before he left the operating room the [facility] had good seal at -125 mm pressure.  He was awakened, extubated, and taken to recovery  room in stable condition with all final counts being correct.  No complications noted.  Postoperatively, we will have to continue the [facility] for the next 48-72 hours with a return to the operating room, likely Friday to reassess the soft tissue and to  determine what the next steps will be for wound treatment and closure.   PUS D: 09/23/2021 11:40:18 am T: 09/23/2021 12:22:00 pm  JOB: [id number]/ [id number]

## 2021-09-23 NOTE — Plan of Care (Signed)

## 2021-09-23 NOTE — Transfer of Care (Signed)
Immediate Anesthesia Transfer of Care Note  Patient: [name]  Procedure(s) Performed: IRRIGATION AND DEBRIDEMENT OF LEG (Left: Leg Lower)  Patient Location: PACU  Anesthesia Type:General  Level of Consciousness: awake, alert  and oriented  Airway & Oxygen Therapy: Patient Spontanous Breathing and Patient connected to face mask oxygen  Post-op Assessment: Report given to RN and Post -op Vital signs reviewed and stable  Post vital signs: Reviewed and stable  Last Vitals:  Vitals Value Taken Time  BP 108/68 09/23/21 1140  Temp    Pulse 113 09/23/21 1141  Resp 12 09/23/21 1141  SpO2 95 % 09/23/21 1141  Vitals shown include unvalidated device data.  Last Pain:  Vitals:   09/23/21 1019  TempSrc:   PainSc: 8       Patients Stated Pain Goal: 1 (33/38/32 9191)  Complications: No notable events documented.

## 2021-09-23 NOTE — ED Provider Notes (Signed)
.  Critical Care Performed by: Teressa Lower, MD Authorized by: Teressa Lower, MD   Critical care provider statement:    Critical care time (minutes):  30   Critical care was necessary to treat or prevent imminent or life-threatening deterioration of the following conditions: limb threatening disease, anticoag reversal.   Critical care was time spent personally by me on the following activities:  Development of treatment plan with patient or surrogate, discussions with consultants, evaluation of patient's response to treatment, examination of patient, ordering and review of laboratory studies, ordering and review of radiographic studies, ordering and performing treatments and interventions, pulse oximetry, re-evaluation of patient's condition and review of old charts    Tonya Wantz, Debe Coder, MD 09/23/21 [phone]

## 2021-09-24 ENCOUNTER — Encounter (HOSPITAL_COMMUNITY): Payer: Self-pay | Admitting: Orthopaedic Surgery

## 2021-09-24 LAB — BASIC METABOLIC PANEL
Anion gap: 5 (ref 5–15)
BUN: 24 mg/dL — ABNORMAL HIGH (ref 8–23)
CO2: 29 mmol/L (ref 22–32)
Calcium: 8.2 mg/dL — ABNORMAL LOW (ref 8.9–10.3)
Chloride: 100 mmol/L (ref 98–111)
Creatinine, Ser: 1.23 mg/dL (ref 0.61–1.24)
GFR, Estimated: >60 mL/min (ref >60)
Glucose, Bld: 146 mg/dL — ABNORMAL HIGH (ref 70–99)
Potassium: 4.3 mmol/L (ref 3.5–5.1)
Sodium: 134 mmol/L — ABNORMAL LOW (ref 135–145)

## 2021-09-24 LAB — CBC
HCT: 26.6 % — ABNORMAL LOW (ref 39.0–52.0)
Hemoglobin: 8.9 g/dL — ABNORMAL LOW (ref 13.0–17.0)
MCH: 31.6 pg (ref 26.0–34.0)
MCHC: 33.5 g/dL (ref 30.0–36.0)
MCV: 94.3 fL (ref 80.0–100.0)
Platelets: 148 10*3/uL — ABNORMAL LOW (ref 150–400)
RBC: 2.82 MIL/uL — ABNORMAL LOW (ref 4.22–5.81)
RDW: 13.9 % (ref 11.5–15.5)
WBC: 13.2 10*3/uL — ABNORMAL HIGH (ref 4.0–10.5)
nRBC: 0 % (ref 0.0–0.2)

## 2021-09-24 NOTE — Evaluation (Signed)
Occupational Therapy Evaluation Patient Details Name: [name] MRN: [id number] DOB: 1956-07-24 Today's Date: 09/24/2021   History of Present Illness 65 y.o. M presenting to ED after fall on 11/20. s/p four compartment fasciotomy of LLE on 11/21. PMH includes L ankle fusion, R hip replacement, arthritis, GERD and HTN.   Clinical Impression   Pt independent at baseline for ADLs and functional mobility. Currently motivated to work with therapy,  requiring supervision for bed mobility and mod A for ADLs. Pt able to complete sit to stand transfer x2 from elevated surface, however is greatly limited by pain, able to take steps in place and requests to sit immediately. Pt agitated and frustrated during session due to pain, moves impulsively and requires verbal cuing for RW hand placement and safety throughout session. Pt presenting with impaired balance, activity tolerance, ROM, pain, and strength at this time. Will continue to follow acutely to maximize safety with ADLs and functional mobility. Recommend d/c home with [place] pending progress.     Recommendations for follow up therapy are one component of a multi-disciplinary discharge planning process, led by the attending physician.  Recommendations may be updated based on patient status, additional functional criteria and insurance authorization.   Follow Up Recommendations  Home health OT    Assistance Recommended at Discharge Intermittent Supervision/Assistance  Functional Status Assessment  Patient has had a recent decline in their functional status and demonstrates the ability to make significant improvements in function in a reasonable and predictable amount of time.  Equipment Recommendations  None recommended by OT;Other (comment) (pt has all DME)    Recommendations for Other Services PT consult     Precautions / Restrictions Precautions Precautions: None Restrictions Weight Bearing Restrictions: Yes LLE Weight Bearing: Weight  bearing as tolerated      Mobility Bed Mobility Overal bed mobility: Needs Assistance Bed Mobility: Sit to Supine;Supine to Sit     Supine to sit: Supervision Sit to supine: Supervision   General bed mobility comments: increased time due to pain    Transfers Overall transfer level: Needs assistance Equipment used: Rolling walker (2 wheels) Transfers: Sit to/from Stand Sit to Stand: Min assist;From elevated surface           General transfer comment: pt with increased pain when WB on LLE. Frustrated due to pain, attempts sit to stand transfer x2      Balance Overall balance assessment: Needs assistance Sitting-balance support: Feet supported Sitting balance-Leahy Scale: Normal     Standing balance support: During functional activity;Reliant on assistive device for balance Standing balance-Leahy Scale: Poor                             ADL either performed or assessed with clinical judgement   ADL Overall ADL's : Needs assistance/impaired Eating/Feeding: Set up;Sitting   Grooming: Set up;Sitting   Upper Body Bathing: Set up;Sitting   Lower Body Bathing: Moderate assistance;Sitting/lateral leans   Upper Body Dressing : Set up;Sitting   Lower Body Dressing: Moderate assistance;Maximal assistance;Sitting/lateral leans Lower Body Dressing Details (indicate cue type and reason): donning socks at EOB Toilet Transfer: Moderate assistance;BSC/3in1   Toileting- Clothing Manipulation and Hygiene: Moderate assistance       Functional mobility during ADLs: Minimal assistance;Moderate assistance       Vision   Vision Assessment?: No apparent visual deficits     Perception     Praxis      Pertinent Vitals/Pain Pain Assessment: Faces  Pain Score: 8  Faces Pain Scale: Hurts whole lot Pain Intervention(s): Limited activity within patient's tolerance;Monitored during session;Premedicated before session     Hand Dominance     Extremity/Trunk  Assessment Upper Extremity Assessment Upper Extremity Assessment: Overall WFL for tasks assessed   Lower Extremity Assessment Lower Extremity Assessment: Defer to PT evaluation   Cervical / Trunk Assessment Cervical / Trunk Assessment: Normal   Communication Communication Communication: No difficulties   Cognition Arousal/Alertness: Awake/alert Behavior During Therapy: Agitated Overall Cognitive Status: Within Functional Limits for tasks assessed                                 General Comments: pt mildly impulsive during therapy session, agitated due to LLE pain     General Comments  pt concern regarding LLE edema, states it was present after he stood for staff to change bedding    Exercises     Shoulder Instructions      Home Living Family/patient expects to be discharged to:: Private residence Living Arrangements: Spouse/significant other Available Help at Discharge: Family Type of Home: House Home Access: Stairs to enter Technical brewer of Steps: 5 Entrance Stairs-Rails: Right Home Layout: One level     Bathroom Shower/Tub: Teacher, early years/pre: Handicapped height Bathroom Accessibility: Yes   Home Equipment: Public relations account executive (2 wheels);Tub bench;Wheelchair - manual;Crutches;Cane - single point          Prior Functioning/Environment Prior Level of Function : Independent/Modified Independent;Working/employed               ADLs Comments: Dealer        OT Problem List: Decreased strength;Decreased range of motion;Decreased activity tolerance;Impaired balance (sitting and/or standing);Decreased coordination;Pain;Decreased knowledge of use of DME or AE;Decreased safety awareness      OT Treatment/Interventions: Self-care/ADL training;Therapeutic exercise;DME and/or AE instruction;Therapeutic activities;Patient/family education    OT Goals(Current goals can be found in the care plan section) Acute Rehab OT  Goals Patient Stated Goal: return home OT Goal Formulation: With patient Time For Goal Achievement: 10/08/21 Potential to Achieve Goals: Good ADL Goals Pt Will Perform Lower Body Dressing: sit to/from stand;with min guard assist Pt Will Transfer to Toilet: with min guard assist;ambulating Pt Will Perform Tub/Shower Transfer: with min guard assist;shower seat;ambulating;rolling walker  OT Frequency: Min 2X/week   Barriers to D/C:            Co-evaluation              AM-PAC OT "6 Clicks" Daily Activity     Outcome Measure Help from another person eating meals?: None Help from another person taking care of personal grooming?: None Help from another person toileting, which includes using toliet, bedpan, or urinal?: A Lot Help from another person bathing (including washing, rinsing, drying)?: A Lot Help from another person to put on and taking off regular upper body clothing?: None Help from another person to put on and taking off regular lower body clothing?: A Lot 6 Click Score: 18   End of Session Equipment Utilized During Treatment: Gait belt;Rolling walker (2 wheels) Nurse Communication: Mobility status;Other (comment) (RN notified of LLE swelling)  Activity Tolerance: Patient limited by pain Patient left: in bed;with call bell/phone within reach;with bed alarm set;with family/visitor present  OT Visit Diagnosis: Unsteadiness on feet (R26.81);Other abnormalities of gait and mobility (R26.89);Repeated falls (R29.6);Muscle weakness (generalized) (M62.81);Pain  Time: 0263-7858 OT Time Calculation (min): 24 min Charges:  OT General Charges $OT Visit: 1 Visit OT Evaluation $OT Eval Low Complexity: 1 Low  Lynnda Child, OTD, OTR/L Acute Rehab [phone]) [phone] - [place] 09/24/2021, 1:37 PM

## 2021-09-24 NOTE — Care Management Important Message (Signed)
Important Message  Patient Details  Name: [name] MRN: [id number] Date of Birth: 10/10/1956   Medicare Important Message Given:  Yes     Joetta Manners 09/24/2021, 10:58 AM

## 2021-09-24 NOTE — Plan of Care (Signed)

## 2021-09-24 NOTE — Evaluation (Signed)
Physical Therapy Evaluation Patient Details Name: [name] MRN: [id number] DOB: September 07, 1956 Today's Date: 09/24/2021  History of Present Illness  65 y.o. M presenting to ED after fall on 11/20. s/p four compartment fasciotomy of LLE on 11/21.  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 Pt demonstrating distal LLE edema and pain, however, has good strength. Able to perform bed level exercises for strengthening and ROM. Requiring min guard assist for transfers to standing. Pt with increase in pain with attempts to weightbear and requesting to sit back down. He will likely progress well with pain control and management. He is aggressively elevating left lower extremity in bed. Encouraged continued bed level exercises and will reassess post op.      Recommendations for follow up therapy are one component of a multi-disciplinary discharge planning process, led by the attending physician.  Recommendations may be updated based on patient status, additional functional criteria and insurance authorization.  Follow Up Recommendations Outpatient PT    Assistance Recommended at Discharge PRN  Functional Status Assessment Patient has had a recent decline in their functional status and demonstrates the ability to make significant improvements in function in a reasonable and predictable amount of time.  Equipment Recommendations  None recommended by PT (pt well equipped)    Recommendations for Other Services       Precautions / Restrictions Precautions Precautions: Other (comment);Fall Precaution Comments: wound vac Restrictions Weight Bearing Restrictions: Yes LLE Weight Bearing: Weight bearing as tolerated      Mobility  Bed Mobility Overal bed mobility: Needs Assistance Bed Mobility: Sit to Supine;Supine to Sit     Supine to sit: Supervision Sit to supine: Supervision   General bed mobility comments: increased time  due to pain    Transfers Overall transfer level: Needs assistance Equipment used: Rolling walker (2 wheels) Transfers: Sit to/from Stand Sit to Stand: From elevated surface;Min guard           General transfer comment: Pt unable to weightbear through LLE, able to hop in place with RLE. Trialed x 2    Ambulation/Gait               General Gait Details: deferred by pt  Stairs            Wheelchair Mobility    Modified Rankin (Stroke Patients Only)       Balance Overall balance assessment: Needs assistance Sitting-balance support: Feet supported Sitting balance-Leahy Scale: Normal     Standing balance support: During functional activity;Reliant on assistive device for balance Standing balance-Leahy Scale: Poor                               Pertinent Vitals/Pain Pain Assessment: Faces Pain Score: 8  Faces Pain Scale: Hurts whole lot Pain Location: LLE with standing Pain Descriptors / Indicators: Constant;Grimacing;Guarding Pain Intervention(s): Limited activity within patient's tolerance;Monitored during session;Premedicated before session;Repositioned    Home Living Family/patient expects to be discharged to:: Private residence Living Arrangements: Spouse/significant other Available Help at Discharge: Family Type of Home: House Home Access: Stairs to enter Entrance Stairs-Rails: Right Entrance Stairs-Number of Steps: 5   Home Layout: One level Home Equipment: Public relations account executive (2 wheels);Tub bench;Wheelchair - manual;Crutches;Cane - single point      Prior Function Prior Level of Function : Independent/Modified Independent;Working/employed  ADLs Comments: Solicitor Dominance        Extremity/Trunk Assessment   Upper Extremity Assessment Upper Extremity Assessment: Overall WFL for tasks assessed    Lower Extremity Assessment Lower Extremity Assessment: LLE deficits/detail LLE Deficits /  Details: History of ankle fusion, able to wiggle toes. Hip/knee WFL    Cervical / Trunk Assessment Cervical / Trunk Assessment: Normal  Communication   Communication: No difficulties  Cognition Arousal/Alertness: Awake/alert Behavior During Therapy: Agitated Overall Cognitive Status: Within Functional Limits for tasks assessed                                 General Comments: pt mildly impulsive during therapy session, agitated due to LLE pain        General Comments General comments (skin integrity, edema, etc.): pt concern regarding LLE edema, states it was present after he stood for staff to change bedding    Exercises General Exercises - Lower Extremity Quad Sets: Both;Supine;15 reps Heel Slides: Supine;15 reps;Left Hip ABduction/ADduction: Supine;15 reps;Left Straight Leg Raises: 15 reps;Supine;Left   Assessment/Plan    PT Assessment Patient needs continued PT services  PT Problem List Decreased strength;Decreased activity tolerance;Decreased balance;Decreased mobility;Pain       PT Treatment Interventions DME instruction;Gait training;Stair training;Functional mobility training;Therapeutic activities;Therapeutic exercise;Balance training;Patient/family education    PT Goals (Current goals can be found in the Care Plan section)  Acute Rehab PT Goals Patient Stated Goal: go back to work ASAP PT Goal Formulation: With patient Time For Goal Achievement: 10/08/21 Potential to Achieve Goals: Good    Frequency Min 3X/week   Barriers to discharge        Co-evaluation               AM-PAC PT "6 Clicks" Mobility  Outcome Measure Help needed turning from your back to your side while in a flat bed without using bedrails?: None Help needed moving from lying on your back to sitting on the side of a flat bed without using bedrails?: A Little Help needed moving to and from a bed to a chair (including a wheelchair)?: A Little Help needed standing up  from a chair using your arms (e.g., wheelchair or bedside chair)?: A Little Help needed to walk in hospital room?: A Lot Help needed climbing 3-5 steps with a railing? : Total 6 Click Score: 16    End of Session   Activity Tolerance: Patient limited by pain Patient left: in bed;with call bell/phone within reach;with bed alarm set;with family/visitor present Nurse Communication: Mobility status PT Visit Diagnosis: Unsteadiness on feet (R26.81);Pain;Difficulty in walking, not elsewhere classified (R26.2) Pain - Right/Left: Left Pain - part of body: Leg    Time: 1430-1458 PT Time Calculation (min) (ACUTE ONLY): 28 min   Charges:   PT Evaluation $PT Eval Low Complexity: 1 Low PT Treatments $Therapeutic Exercise: 8-22 mins        Wyona Almas, PT, DPT Acute Rehabilitation Services Pager [phone] Office [phone]   Deno Etienne 09/24/2021, 4:48 PM

## 2021-09-24 NOTE — Progress Notes (Signed)
Patient ID: [name], male   DOB: 14-Nov-1955, 65 y.o.   MRN: [id number] The patient reports feeling much less pressure in his leg and he agrees pain over the last several hours.  He is comfortable appearing.  His vital signs are stable.  The left lower extremity back has good output.  The plan will be to return to surgery this coming Friday for a repeat I&D of his left leg wounds and potential wound closure versus the possibility of skin grafting depending on the nature of the wound.  He can be up with therapy for mobility in his room with weightbearing as tolerated and can get in a chair.

## 2021-09-25 LAB — CBC
HCT: 24.2 % — ABNORMAL LOW (ref 39.0–52.0)
Hemoglobin: 7.8 g/dL — ABNORMAL LOW (ref 13.0–17.0)
MCH: 30.8 pg (ref 26.0–34.0)
MCHC: 32.2 g/dL (ref 30.0–36.0)
MCV: 95.7 fL (ref 80.0–100.0)
Platelets: 119 10*3/uL — ABNORMAL LOW (ref 150–400)
RBC: 2.53 MIL/uL — ABNORMAL LOW (ref 4.22–5.81)
RDW: 14.1 % (ref 11.5–15.5)
WBC: 9.7 10*3/uL (ref 4.0–10.5)
nRBC: 0 % (ref 0.0–0.2)

## 2021-09-25 LAB — PREPARE RBC (CROSSMATCH)

## 2021-09-25 MED ORDER — SODIUM CHLORIDE 0.9% IV SOLUTION: Freq: Once | INTRAVENOUS | Status: AC

## 2021-09-25 NOTE — Progress Notes (Signed)
Patient ID: [name], male   DOB: [birth year], 65 y.o.   MRN: [id number] The patient understands that our plan is to proceed to surgery tomorrow for reassessment of his left leg fasciotomy wounds.  We will assess the muscles and irrigate the wound.  Debridement will be according to what the condition of the soft tissue is.  Hopefully will be able to close the fasciotomy wounds versus consideration for synthetic skin grafting versus even just having to [facility] the wound based on what we find tomorrow.  He is slightly tachycardic and his hemoglobin is 7.8.  We will transfuse him 1 unit of blood today due to symptomatic anemia and since we will be taking him back to surgery tomorrow.  We will have him n.p.o. after midnight tonight.

## 2021-09-25 NOTE — Anesthesia Preprocedure Evaluation (Addendum)
Anesthesia Evaluation  Patient identified by MRN, date of birth, ID band Patient awake    Reviewed: Allergy & Precautions, H&P , NPO status , Patient's Chart, lab work & pertinent test results  History of Anesthesia Complications Negative for: history of anesthetic complications  Airway Mallampati: II  TM Distance: >3 FB Neck ROM: Full    Dental  (+) Dental Advisory Given, Chipped,    Pulmonary neg pulmonary ROS,    Pulmonary exam normal breath sounds clear to auscultation       Cardiovascular hypertension, Pt. on medications +CHF  negative cardio ROS Normal cardiovascular exam+ Valvular Problems/Murmurs MR  Rate:Normal  S/P MVR TEE 1. Left ventricular ejection fraction, by estimation, is 45 to 50%. The left ventricle has mildly decreased function. The left ventricle has no regional wall motion abnormalities. There is mild concentric left ventricular hypertrophy. Left ventricular  diastolic function could not be evaluated. 2. Right ventricular systolic function is mildly reduced. The right ventricular size is normal. Tricuspid regurgitation signal is inadequate for assessing PA pressure. 3. Left atrial size was moderately dilated. 4. Right atrial size was moderately dilated. 5. The aortic valve is tricuspid. Aortic valve regurgitation is not visualized. No aortic stenosis is present.  Comparison(s): Prior images reviewed side by side. Changes from prior study are noted. The left ventricular function is worsened. The rhythm is now atrial fibrillation with rapid ventricular response and the transmitral gradient has increased.   Neuro/Psych Seizures -, Well Controlled,  Anxiety negative psych ROS   GI/Hepatic Neg liver ROS, GERD  Controlled,  Endo/Other  negative endocrine ROS  Renal/GU negative Renal ROS  negative genitourinary   Musculoskeletal  (+) Arthritis , Osteoarthritis,    Abdominal   Peds negative  pediatric ROS (+)  Hematology  (+) anemia , hgb 7.8, plan by ortho to get 1uprbc.    Anesthesia Other Findings Day of surgery medications reviewed with the patient.  Reproductive/Obstetrics negative OB ROS                           Anesthesia Physical  Anesthesia Plan  ASA: 3  Anesthesia Plan: General   Post-op Pain Management:    Induction: Intravenous  PONV Risk Score and Plan: 3 and Ondansetron, Dexamethasone, Midazolam and Treatment may vary due to age or medical condition  Airway Management Planned: LMA  Additional Equipment:   Intra-op Plan:   Post-operative Plan: Extubation in OR  Informed Consent: I have reviewed the patients History and Physical, chart, labs and discussed the procedure including the risks, benefits and alternatives for the proposed anesthesia with the patient or authorized representative who has indicated his/her understanding and acceptance.     Dental advisory given  Plan Discussed with: CRNA, Anesthesiologist and Surgeon  Anesthesia Plan Comments: (Received 1uPRBCs on 09/25/21. Did well with GA/LMA on 09/23/21. Will plan for LMA again today barring any contraindications. Norton Blizzard, MD  )       Anesthesia Quick Evaluation

## 2021-09-26 ENCOUNTER — Inpatient Hospital Stay (HOSPITAL_COMMUNITY): Payer: Medicare HMO | Admitting: Anesthesiology

## 2021-09-26 ENCOUNTER — Encounter (HOSPITAL_COMMUNITY)
Admission: EM | Disposition: A | Discharge status: Home / Self Care | Payer: Self-pay | Source: Home / Self Care | Attending: Orthopaedic Surgery

## 2021-09-26 ENCOUNTER — Encounter (HOSPITAL_COMMUNITY): Payer: Self-pay | Admitting: Orthopaedic Surgery

## 2021-09-26 DIAGNOSIS — T79A22D Traumatic compartment syndrome of left lower extremity, subsequent encounter: Secondary | ICD-10-CM | POA: Diagnosis not present

## 2021-09-26 HISTORY — PX: I & D EXTREMITY: SHX5045

## 2021-09-26 LAB — CBC
HCT: 29.2 % — ABNORMAL LOW (ref 39.0–52.0)
Hemoglobin: 9.6 g/dL — ABNORMAL LOW (ref 13.0–17.0)
MCH: 31 pg (ref 26.0–34.0)
MCHC: 32.9 g/dL (ref 30.0–36.0)
MCV: 94.2 fL (ref 80.0–100.0)
Platelets: 184 10*3/uL (ref 150–400)
RBC: 3.1 MIL/uL — ABNORMAL LOW (ref 4.22–5.81)
RDW: 13.8 % (ref 11.5–15.5)
WBC: 8.5 10*3/uL (ref 4.0–10.5)
nRBC: 0 % (ref 0.0–0.2)

## 2021-09-26 LAB — TYPE AND SCREEN
ABO/RH(D): O POS
Antibody Screen: NEGATIVE
Unit division: 0

## 2021-09-26 LAB — BPAM RBC
Blood Product Expiration Date: 202212262359
ISSUE DATE / TIME: 202211241259
Unit Type and Rh: 5100

## 2021-09-26 SURGERY — IRRIGATION AND DEBRIDEMENT EXTREMITY
Anesthesia: General | Site: Leg Lower | Laterality: Left

## 2021-09-26 MED ORDER — LIDOCAINE 2% (20 MG/ML) 5 ML SYRINGE
INTRAMUSCULAR | Status: AC
  Filled 2021-09-26: qty 5

## 2021-09-26 MED ORDER — MIDAZOLAM HCL 2 MG/2ML IJ SOLN
INTRAMUSCULAR | Status: DC | PRN
Start: 2021-09-26 — End: 2021-09-26
  Administered 2021-09-26: 2 mg via INTRAVENOUS

## 2021-09-26 MED ORDER — WARFARIN - PHYSICIAN DOSING INPATIENT: Freq: Every day | Status: DC

## 2021-09-26 MED ORDER — PROPOFOL 10 MG/ML IV BOLUS
INTRAVENOUS | Status: AC
  Filled 2021-09-26: qty 40

## 2021-09-26 MED ORDER — 0.9 % SODIUM CHLORIDE (POUR BTL) OPTIME
TOPICAL | Status: DC | PRN
  Administered 2021-09-26: 1000 mL

## 2021-09-26 MED ORDER — PHENYLEPHRINE 40 MCG/ML (10ML) SYRINGE FOR IV PUSH (FOR BLOOD PRESSURE SUPPORT)
PREFILLED_SYRINGE | INTRAVENOUS | Status: DC | PRN
  Administered 2021-09-26: 120 ug via INTRAVENOUS

## 2021-09-26 MED ORDER — ONDANSETRON HCL 4 MG/2ML IJ SOLN
INTRAMUSCULAR | Status: AC
  Filled 2021-09-26: qty 2

## 2021-09-26 MED ORDER — LACTATED RINGERS IV SOLN: INTRAVENOUS | Status: DC | PRN

## 2021-09-26 MED ORDER — PROPOFOL 10 MG/ML IV BOLUS
INTRAVENOUS | Status: DC | PRN
  Administered 2021-09-26: 100 mg via INTRAVENOUS
  Administered 2021-09-26: 200 mg via INTRAVENOUS

## 2021-09-26 MED ORDER — FENTANYL CITRATE (PF) 100 MCG/2ML IJ SOLN
INTRAMUSCULAR | Status: DC | PRN
  Administered 2021-09-26 (×6): 25 ug via INTRAVENOUS

## 2021-09-26 MED ORDER — PHENYLEPHRINE HCL-NACL 20-0.9 MG/250ML-% IV SOLN
INTRAVENOUS | Status: DC | PRN
  Administered 2021-09-26: 20 ug/min via INTRAVENOUS

## 2021-09-26 MED ORDER — MIDAZOLAM HCL 2 MG/2ML IJ SOLN
INTRAMUSCULAR | Status: AC
  Filled 2021-09-26: qty 2

## 2021-09-26 MED ORDER — FENTANYL CITRATE (PF) 250 MCG/5ML IJ SOLN
INTRAMUSCULAR | Status: AC
  Filled 2021-09-26: qty 5

## 2021-09-26 MED ORDER — WARFARIN SODIUM 5 MG PO TABS
5.0000 mg | ORAL_TABLET | Freq: Every day | ORAL | Status: DC
  Administered 2021-09-26 – 2021-09-27 (×2): 5 mg via ORAL
  Filled 2021-09-26 (×2): qty 1

## 2021-09-26 MED ORDER — ONDANSETRON HCL 4 MG/2ML IJ SOLN
INTRAMUSCULAR | Status: DC | PRN
  Administered 2021-09-26: 4 mg via INTRAVENOUS

## 2021-09-26 MED ORDER — DEXAMETHASONE SODIUM PHOSPHATE 10 MG/ML IJ SOLN
INTRAMUSCULAR | Status: DC | PRN
  Administered 2021-09-26: 5 mg via INTRAVENOUS

## 2021-09-26 MED ORDER — MELATONIN 5 MG PO TABS
10.0000 mg | ORAL_TABLET | Freq: Every day | ORAL | Status: DC
  Administered 2021-09-26 – 2021-09-27 (×2): 10 mg via ORAL
  Filled 2021-09-26 (×2): qty 2

## 2021-09-26 MED ORDER — DEXAMETHASONE SODIUM PHOSPHATE 10 MG/ML IJ SOLN
INTRAMUSCULAR | Status: AC
  Filled 2021-09-26: qty 1

## 2021-09-26 MED ORDER — LIDOCAINE 2% (20 MG/ML) 5 ML SYRINGE
INTRAMUSCULAR | Status: DC | PRN
  Administered 2021-09-26: 100 mg via INTRAVENOUS

## 2021-09-26 SURGICAL SUPPLY — 49 items
BAG COUNTER SPONGE SURGICOUNT (BAG) ×2 IMPLANT
BAG SURGICOUNT SPONGE COUNTING (BAG) ×1
BLADE SURG 10 STRL SS (BLADE) ×3 IMPLANT
BNDG COHESIVE 6X5 TAN NS LF (GAUZE/BANDAGES/DRESSINGS) ×3 IMPLANT
CANISTER WOUNDNEG PRESSURE 500 (CANNISTER) ×3 IMPLANT
COVER SURGICAL LIGHT HANDLE (MISCELLANEOUS) ×3 IMPLANT
CUFF TOURN SGL QUICK 34 (TOURNIQUET CUFF)
CUFF TRNQT CYL 34X4.125X (TOURNIQUET CUFF) IMPLANT
DRAPE INCISE IOBAN 66X45 STRL (DRAPES) ×3 IMPLANT
DRAPE ORTHO SPLIT 77X108 STRL (DRAPES) ×6
DRAPE SURG ORHT 6 SPLT 77X108 (DRAPES) ×2 IMPLANT
DRAPE U-SHAPE 47X51 STRL (DRAPES) ×3 IMPLANT
DRSG ADAPTIC 3X8 NADH LF (GAUZE/BANDAGES/DRESSINGS) ×3 IMPLANT
DRSG PAD ABDOMINAL 8X10 ST (GAUZE/BANDAGES/DRESSINGS) ×3 IMPLANT
DRSG VAC ATS LRG SENSATRAC (GAUZE/BANDAGES/DRESSINGS) ×3 IMPLANT
DRSG XEROFORM 1X8 (GAUZE/BANDAGES/DRESSINGS) ×3 IMPLANT
ELECT REM PT RETURN 9FT ADLT (ELECTROSURGICAL) ×3
ELECTRODE REM PT RTRN 9FT ADLT (ELECTROSURGICAL) ×1 IMPLANT
GAUZE SPONGE 4X4 12PLY STRL (GAUZE/BANDAGES/DRESSINGS) ×3 IMPLANT
GLOVE SRG 8 PF TXTR STRL LF DI (GLOVE) ×2 IMPLANT
GLOVE SURG ENC MOIS LTX SZ8 (GLOVE) ×3 IMPLANT
GLOVE SURG ORTHO LTX SZ7.5 (GLOVE) ×3 IMPLANT
GLOVE SURG UNDER POLY LF SZ8 (GLOVE) ×6
GOWN STRL REUS W/ TWL LRG LVL3 (GOWN DISPOSABLE) ×2 IMPLANT
GOWN STRL REUS W/ TWL XL LVL3 (GOWN DISPOSABLE) ×1 IMPLANT
GOWN STRL REUS W/TWL LRG LVL3 (GOWN DISPOSABLE) ×6
GOWN STRL REUS W/TWL XL LVL3 (GOWN DISPOSABLE) ×3
GRAFT MYRIAD 3 LAYER 10X20 (Graft) ×3 IMPLANT
HANDPIECE INTERPULSE COAX TIP (DISPOSABLE)
KIT BASIN OR (CUSTOM PROCEDURE TRAY) ×3 IMPLANT
KIT DRSG PREVENA PLUS 7DAY 125 (MISCELLANEOUS) ×3 IMPLANT
KIT TURNOVER KIT B (KITS) ×3 IMPLANT
MANIFOLD NEPTUNE II (INSTRUMENTS) ×3 IMPLANT
NS IRRIG 1000ML POUR BTL (IV SOLUTION) ×3 IMPLANT
PACK ORTHO EXTREMITY (CUSTOM PROCEDURE TRAY) ×3 IMPLANT
PAD ARMBOARD 7.5X6 YLW CONV (MISCELLANEOUS) ×6 IMPLANT
POWDER MYRIAD MORCELLS 500MG (Miscellaneous) ×3 IMPLANT
SET HNDPC FAN SPRY TIP SCT (DISPOSABLE) IMPLANT
SPONGE T-LAP 18X18 ~~LOC~~+RFID (SPONGE) ×6 IMPLANT
STOCKINETTE IMPERVIOUS 9X36 MD (GAUZE/BANDAGES/DRESSINGS) ×3 IMPLANT
SUT ETHILON 2 0 FS 18 (SUTURE) IMPLANT
SUT ETHILON 3 0 PS 1 (SUTURE) IMPLANT
SUT MNCRL AB 4-0 PS2 18 (SUTURE) ×12 IMPLANT
TOWEL GREEN STERILE (TOWEL DISPOSABLE) ×3 IMPLANT
TOWEL GREEN STERILE FF (TOWEL DISPOSABLE) ×3 IMPLANT
TUBE CONNECTING 12'X1/4 (SUCTIONS) ×1
TUBE CONNECTING 12X1/4 (SUCTIONS) ×2 IMPLANT
UNDERPAD 30X36 HEAVY ABSORB (UNDERPADS AND DIAPERS) ×3 IMPLANT
YANKAUER SUCT BULB TIP NO VENT (SUCTIONS) ×3 IMPLANT

## 2021-09-26 NOTE — Anesthesia Postprocedure Evaluation (Signed)
Anesthesia Post Note  Patient: KEYONDRE HEPBURN  Procedure(s) Performed: REPEAT IRRIGATION AND DEBRIDEMENT LEFT LEG, POSSIBLE WOUND CLOSURE, POSSIBLE VAC CHANGE, POSSIBLE SKIN GRAFT (Left: Leg Lower)     Patient location during evaluation: PACU Anesthesia Type: General Level of consciousness: awake Pain management: pain level controlled Vital Signs Assessment: post-procedure vital signs reviewed and stable Respiratory status: spontaneous breathing and respiratory function stable Cardiovascular status: stable Postop Assessment: no apparent nausea or vomiting Anesthetic complications: no   No notable events documented.  Last Vitals:  Vitals:   09/26/21 0905 09/26/21 0932  BP: (!) 145/96 (!) 134/101  Pulse: (!) 114 (!) 111  Resp: 17 18  Temp: (!) 36.1 C 37.3 C  SpO2: 100% 99%    Last Pain:  Vitals:   09/26/21 0932  TempSrc: Oral  PainSc: 10-Worst pain ever                 Merlinda Frederick

## 2021-09-26 NOTE — Progress Notes (Signed)
Arrived back to the unit from PACU.

## 2021-09-26 NOTE — Progress Notes (Addendum)
Occupational Therapy Treatment Patient Details Name: [name] MRN: [id number] DOB: 11/15/1955 Today's Date: 09/26/2021   History of present illness 65 y.o. M presenting to ED after fall on 11/20. s/p four compartment fasciotomy of LLE on 11/21. LLE fasciotomy with graft placement performed 11/25.   OT comments  Pt underwent LLE fasciotomy without debridement this AM, remains supervision for bed mobility/scoot transfers and min guard for all other transfers. Pt performed sit to stand transfer x3, reports inability to put weight through LLE without increased pain. Pt mod A for LB dressing, able to assist pushing heels into shoes prior to transfers. Pt limited by pain, impaired balance,and activity tolerance at this time. Will continue to follow acutely, recommend [facility] pending pt progress.    Recommendations for follow up therapy are one component of a multi-disciplinary discharge planning process, led by the attending physician.  Recommendations may be updated based on patient status, additional functional criteria and insurance authorization.    Follow Up Recommendations  Home health OT    Assistance Recommended at Discharge Intermittent Supervision/Assistance  Equipment Recommendations  None recommended by OT    Recommendations for Other Services PT consult    Precautions / Restrictions Precautions Precautions: Other (comment);Fall Precaution Comments: wound vac Restrictions Weight Bearing Restrictions: Yes LLE Weight Bearing: Weight bearing as tolerated       Mobility Bed Mobility Overal bed mobility: Needs Assistance Bed Mobility: Sit to Supine;Supine to Sit     Supine to sit: Supervision Sit to supine: Supervision   General bed mobility comments: increased time due to pain    Transfers Overall transfer level: Needs assistance Equipment used: Rolling walker (2 wheels) Transfers: Sit to/from Stand Sit to Stand: From elevated surface;Min guard            General transfer comment: unable to WB through LLE, reports being able to shift <25% of weight to LLE without extreme pain     Balance Overall balance assessment: Needs assistance Sitting-balance support: Feet supported Sitting balance-Leahy Scale: Normal     Standing balance support: During functional activity;Reliant on assistive device for balance Standing balance-Leahy Scale: Poor                             ADL either performed or assessed with clinical judgement   ADL                       Lower Body Dressing: Moderate assistance   Toilet Transfer: Moderate assistance;BSC/3in1;Maximal assistance;Stand-pivot;Cueing for safety;Cueing for sequencing                  Extremity/Trunk Assessment Upper Extremity Assessment Upper Extremity Assessment: Overall WFL for tasks assessed   Lower Extremity Assessment Lower Extremity Assessment: Defer to PT evaluation        Vision   Vision Assessment?: No apparent visual deficits   Perception     Praxis      Cognition Arousal/Alertness: Awake/alert Behavior During Therapy: Impulsive Overall Cognitive Status: Within Functional Limits for tasks assessed                                            Exercises     Shoulder Instructions       General Comments wife present during session    Pertinent Vitals/ Pain  Pain Assessment: Faces Pain Score: 6  Faces Pain Scale: Hurts even more Pain Location: LLE with standing Pain Descriptors / Indicators: Constant;Grimacing;Guarding Pain Intervention(s): Limited activity within patient's tolerance;Monitored during session;Premedicated before session;Repositioned  Home Living                                          Prior Functioning/Environment              Frequency  Min 2X/week        Progress Toward Goals  OT Goals(current goals can now be found in the care plan section)  Progress towards OT  goals: Progressing toward goals  Acute Rehab OT Goals Patient Stated Goal: return home OT Goal Formulation: With patient Time For Goal Achievement: 10/08/21 Potential to Achieve Goals: Good ADL Goals Pt Will Perform Lower Body Dressing: sit to/from stand;with min guard assist Pt Will Transfer to Toilet: with min guard assist;ambulating Pt Will Perform Tub/Shower Transfer: with min guard assist;shower seat;ambulating;rolling walker  Plan Discharge plan remains appropriate;Frequency remains appropriate    Co-evaluation                 AM-PAC OT "6 Clicks" Daily Activity     Outcome Measure   Help from another person eating meals?: None Help from another person taking care of personal grooming?: None Help from another person toileting, which includes using toliet, bedpan, or urinal?: A Lot Help from another person bathing (including washing, rinsing, drying)?: A Lot Help from another person to put on and taking off regular upper body clothing?: None Help from another person to put on and taking off regular lower body clothing?: A Lot 6 Click Score: 18    End of Session Equipment Utilized During Treatment: Gait belt;Rolling walker (2 wheels)  OT Visit Diagnosis: Unsteadiness on feet (R26.81);Other abnormalities of gait and mobility (R26.89);Repeated falls (R29.6);Muscle weakness (generalized) (M62.81);Pain   Activity Tolerance Patient limited by pain   Patient Left in bed;with call bell/phone within reach;with bed alarm set;with family/visitor present   Nurse Communication Mobility status;Patient requests pain meds        Time: 2482-5003 OT Time Calculation (min): 17 min  Charges: OT Treatments $Self Care/Home Management : 8-22 mins  Lynnda Child, OTD, OTR/L Acute Rehab [phone] - [place] 09/26/2021, 1:53 PM

## 2021-09-26 NOTE — Plan of Care (Signed)

## 2021-09-26 NOTE — Anesthesia Procedure Notes (Signed)
Procedure Name: LMA Insertion Date/Time: 09/26/2021 7:35 AM Performed by: Erick Colace, CRNA Pre-anesthesia Checklist: Patient identified, Emergency Drugs available, Suction available, Patient being monitored and Timeout performed Patient Re-evaluated:Patient Re-evaluated prior to induction Oxygen Delivery Method: Circle system utilized Preoxygenation: Pre-oxygenation with 100% oxygen Induction Type: IV induction Ventilation: Mask ventilation without difficulty and Oral airway inserted - appropriate to patient size LMA: LMA inserted LMA Size: 5.0 Dental Injury: Teeth and Oropharynx as per pre-operative assessment

## 2021-09-26 NOTE — Brief Op Note (Signed)
09/26/2021  8:44 AM  PATIENT:  [name]  65 y.o. male  PRE-OPERATIVE DIAGNOSIS:  compartment syndrome left leg, s/p fasciotomies  POST-OPERATIVE DIAGNOSIS:  compartment syndrome left leg, s/p fasciotomies  PROCEDURE:  Procedure(s): REPEAT IRRIGATION AND DEBRIDEMENT LEFT LEG, POSSIBLE WOUND CLOSURE, POSSIBLE VAC CHANGE, POSSIBLE SKIN GRAFT (Left)  SURGEON:  Surgeon(s) and Role:    Mcarthur Rossetti, MD - Primary  ANESTHESIA:   general  EBL:  15 mL   COUNTS:  YES  TOURNIQUET:  * No tourniquets in log *  DICTATION: .Other Dictation: Dictation Number [id number]  PLAN OF CARE: Admit to inpatient   PATIENT DISPOSITION:  PACU - hemodynamically stable.   Delay start of Pharmacological VTE agent (>24hrs) due to surgical blood loss or risk of bleeding: no

## 2021-09-26 NOTE — Progress Notes (Signed)
Patient ID: [name], male   DOB: [birth year], 65 y.o.   MRN: [id number] [name] understands that our plan is to return to surgery this morning for a repeat I&D of his left leg fasciotomy wound as well as to assess the muscle and soft tissue for any damage or necrosis.  Depending on her intraoperative findings, the plan will be to hopefully close the fasciotomy wound versus the possibility of skin grafting.  There is also possibility that further debridement would be needed and again a VAC placed again over the wound and further wound treatment be performed.  This will all depend on our intraoperative findings.  The risks and benefits of surgery have been explained in detail and informed consent is obtained.  The left leg has been marked.

## 2021-09-26 NOTE — Op Note (Signed)
NAMENOLEN, LINDAMOOD MEDICAL RECORD NO: [id number] ACCOUNT NO: [id number] DATE OF BIRTH: [birth year] FACILITY: [facility] LOCATION: [facility] PHYSICIAN: Lind Guest. Ninfa Linden, MD  Operative Report   DATE OF PROCEDURE: 09/26/2021  PREOPERATIVE DIAGNOSIS:  Left lower extremity lateral fasciotomy wound.  POSTOPERATIVE DIAGNOSIS:  Left lower extremity lateral fasciotomy wound.  PROCEDURE PERFORMED: 1.  Irrigation of left lower extremity fasciotomy wound with no debridement needed. 2.  Placement of acellular dermal matrix graft with placement of a 10 x 23 layer Myriad matrix graft to left lower extremity wound.  FINDINGS:  A 7 x 20 lateral fasciotomy wound with no necrotic tissue and contractile muscle.  SURGEON:  Jean Rosenthal, MD  ANESTHESIA:  General.  ESTIMATED BLOOD LOSS:  Minimal.  COMPLICATIONS:  None.  INDICATIONS:  The patient is a 65 year old gentleman well known to me.  He unfortunately sustained a significant contusion of the soft tissue of his left leg earlier this week.  He then went on to develop compartment syndrome.  He is on chronic blood  thinning medication and we took him to the operating room emergently on Monday of this week for an evacuation of hematoma and 4-compartment fasciotomies.  We then took him back to the operating room the next day for a repeat evacuation of hematoma.  He  has had a VAC sponge over the lateral wound for several days now.  He is now presenting to the operating room for the potential for closure of fasciotomy wounds versus skin grafting.  The risks and benefits of surgery have been explained in detail and he  understands the rationale behind proceeding with surgery.  DESCRIPTION OF PROCEDURE:  After informed consent was obtained, appropriate left leg was marked.  He was brought to the operating room and placed supine on the operating table.  General anesthesia was then obtained.  We then took down the [facility] over his  leg and there was no soft  tissue necrosis fortunately.  The muscle all appeared viable and with good granulation tissue over the lateral aspect of his leg.  There was no necrosis that I could see.  However, the skin had contracted enough that there was  going to be no way to actually close the wound. We felt it was appropriate to proceed with grafting procedure.  His left thigh, knee, leg, ankle, foot were then prepped and draped with Betadine and sterile drapes.  A timeout was called and he was  identified as correct patient, correct left leg.  I then used normal saline and with the bulb syringe irrigated all the compartments of the lateral aspect of his leg.  The medial wound was intact and looked good and was closed.  The soft tissue was soft  all around.  There was no evidence of purulence and again no necrotic muscle at all.  We then dried the wound real well.  We measured the wound again at 7 cm x 20 cm.  We had our Myriad matrix 3-layer acellular dermal graft opened up.  We first placed  Myriad Morcells which is a powder over the wound and then was able to lie down the matrix fashioning it to the wound.  We then placed Adaptic on top of this and Surgilube gel for moisture.  We then placed a VAC sponge over the graft and placed material  around the VAC sponge.  We placed Ioban and other dressing around the VAC sponge.  We set this up to suction and we got excellent seal at -125  mm of continuous pressure.  We then placed dry dressings on top of this as well to cover the VAC sponge and  maintained a good seal.  He was awakened, extubated, and taken to recovery room in stable condition with all final counts being correct.  No complications noted.  Postoperatively, we will leave the regular VAC on for probably just one day with a goal of  sending him home tomorrow with a Prevena wound VAC for 5 days.   NIK D: 09/26/2021 8:42:53 am T: 09/26/2021 9:52:00 am  JOB: [id number]/ [id number]

## 2021-09-26 NOTE — Progress Notes (Addendum)
Patient's high heart rate triggering Yellow MEWS. Pain med and scheduled med given. Charge nurse has been notified. Yellow MEWS protocol will be followed. Will continue to monitor    09/26/21 2011  Assess: MEWS Score  Temp 98.5 F (36.9 C)  BP 119/83  Pulse Rate (!) 116  Resp 20  SpO2 99 %  Assess: MEWS Score  MEWS Temp 0  MEWS Systolic 0  MEWS Pulse 2  MEWS RR 0  MEWS LOC 0  MEWS Score 2  MEWS Score Color Yellow  Assess: if the MEWS score is Yellow or Red  Were vital signs taken at a resting state? Yes  Focused Assessment No change from prior assessment  Early Detection of Sepsis Score *See Row Information* Low  MEWS guidelines implemented *See Row Information* No, previously yellow, continue vital signs every 4 hours  Treat  Pain Scale 0-10  Pain Score 8  Pain Type Acute pain  Pain Location Leg  Pain Orientation Left  Pain Descriptors / Indicators Aching  Pain Frequency Intermittent

## 2021-09-26 NOTE — Transfer of Care (Signed)
Immediate Anesthesia Transfer of Care Note  Patient: GEORDAN XU  Procedure(s) Performed: REPEAT IRRIGATION AND DEBRIDEMENT LEFT LEG, POSSIBLE WOUND CLOSURE, POSSIBLE VAC CHANGE, POSSIBLE SKIN GRAFT (Left: Leg Lower)  Patient Location: PACU  Anesthesia Type:General  Level of Consciousness: awake  Airway & Oxygen Therapy: Patient Spontanous Breathing and Patient connected to face mask oxygen  Post-op Assessment: Report given to RN and Post -op Vital signs reviewed and stable  Post vital signs: Reviewed and stable  Last Vitals:  Vitals Value Taken Time  BP 160/120 09/26/21 0841  Temp    Pulse 113 09/26/21 0840  Resp 21 09/26/21 0841  SpO2 95 % 09/26/21 0840  Vitals shown include unvalidated device data.  Last Pain:  Vitals:   09/26/21 0605  TempSrc: Oral  PainSc:       Patients Stated Pain Goal: 4 (30/16/01 0932)  Complications: No notable events documented.

## 2021-09-26 NOTE — Progress Notes (Signed)
PHARMACIST - PHYSICIAN COMMUNICATION DR:  Ninfa Linden CONCERNING: Pharmacy Care Issues Regarding Warfarin Labs  RECOMMENDATION (Action Taken): A daily protime for three days has been ordered to meet the [facility] National Patient safety goal and comply with the current [place].   The Pharmacy will defer all warfarin dose order changes and follow up of lab results to the prescriber unless an additional order to initiate a "pharmacy Coumadin consult" is placed.  DESCRIPTION:  While hospitalized, to be in compliance with [facility] Patient Safety Goals, all patients on warfarin must have a baseline and/or current protime prior to the administration of warfarin. Pharmacy has received your order for warfarin without these required laboratory assessments.  Thank you for involving pharmacy in this patient's care.  Renold Genta, PharmD, BCPS Clinical Pharmacist Clinical phone for 09/26/2021 until 3p is [phone] 09/26/2021 1:09 PM  **Pharmacist phone directory can be found on [email or web address] listed under [place]**

## 2021-09-26 NOTE — Addendum Note (Signed)
Addendum  created 09/26/21 1225 by Erick Colace, CRNA   Charge Capture section accepted

## 2021-09-27 LAB — PROTIME-INR
INR: 1.1 (ref 0.8–1.2)
Prothrombin Time: 14.3 seconds (ref 11.4–15.2)

## 2021-09-27 MED ORDER — METOPROLOL TARTRATE 25 MG PO TABS
25.0000 mg | ORAL_TABLET | Freq: Two times a day (BID) | ORAL | Status: DC
  Administered 2021-09-27 – 2021-09-28 (×2): 25 mg via ORAL
  Filled 2021-09-27 (×2): qty 1

## 2021-09-27 MED ORDER — OXYCODONE HCL 5 MG PO TABS: 5.0000 mg | ORAL_TABLET | ORAL | 0 refills | Status: DC | PRN

## 2021-09-27 NOTE — Progress Notes (Signed)
Patient ID: [name], male   DOB: 28-Feb-1956, 65 y.o.   MRN: [id number] The patient is postop day 1 status post a biologic synthetic skin graft placement to his left leg wound.  The VAC is in place and has a good seal.  He has been up with therapy today and walked.  He does have Afib which is chronic and he is slightly tachycardic.  I will increase his metoprolol today.  The plan is to have him stay today and discharge tomorrow with a Prevena wound VAC and then I will see him on Wednesday next week in the office for wound assessment.

## 2021-09-27 NOTE — Progress Notes (Signed)
Occupational Therapy Treatment Patient Details Name: [name] MRN: [id number] DOB: 09/28/1956 Today's Date: 09/27/2021   History of present illness 65 y.o. M presenting to ED after fall on 11/20. s/p four compartment fasciotomy of LLE on 11/21. LLE fasciotomy wtihout debridement performed 11/25.   OT comments  Pt with decreased pain this session, min guard for ADLs and transfers, independent with bed mobility. Able to walk household distance with RW. Educated pt on shower transfer once home, encouraged pt to use shower seat when showering for safety, pt verbalized understanding. Pt limited by impaired activity tolerance, balance, ROM, and pain at this time. Recommend safe d/c home with assistance.   Recommendations for follow up therapy are one component of a multi-disciplinary discharge planning process, led by the attending physician.  Recommendations may be updated based on patient status, additional functional criteria and insurance authorization.    Follow Up Recommendations  No OT follow up    Assistance Recommended at Discharge Intermittent Supervision/Assistance  Equipment Recommendations  None recommended by OT;Other (comment) (pt has all DME)    Recommendations for Other Services PT consult    Precautions / Restrictions Precautions Precautions: None Precaution Comments: wound vac Restrictions Weight Bearing Restrictions: Yes LLE Weight Bearing: Weight bearing as tolerated       Mobility Bed Mobility Overal bed mobility: Independent Bed Mobility: Supine to Sit;Sit to Supine     Supine to sit: Independent Sit to supine: Independent        Transfers Overall transfer level: Needs assistance Equipment used: Rolling walker (2 wheels) Transfers: Sit to/from Stand Sit to Stand: Min guard                 Balance Overall balance assessment: Needs assistance Sitting-balance support: Feet supported Sitting balance-Leahy Scale: Good     Standing  balance support: During functional activity;Reliant on assistive device for balance Standing balance-Leahy Scale: Fair                             ADL either performed or assessed with clinical judgement   ADL                       Lower Body Dressing: Min guard;Sitting/lateral leans Lower Body Dressing Details (indicate cue type and reason): donned shoes EOB Toilet Transfer: Min guard;Ambulation;Regular Museum/gallery exhibitions officer and Hygiene: Min guard;Sitting/lateral lean       Functional mobility during ADLs: Min guard      Extremity/Trunk Assessment Upper Extremity Assessment Upper Extremity Assessment: Overall WFL for tasks assessed   Lower Extremity Assessment Lower Extremity Assessment: Defer to PT evaluation        Vision   Vision Assessment?: No apparent visual deficits   Perception Perception Perception: Not tested   Praxis Praxis Praxis: Not tested    Cognition Arousal/Alertness: Awake/alert Behavior During Therapy: Impulsive;Anxious;WFL for tasks assessed/performed Overall Cognitive Status: Within Functional Limits for tasks assessed                                            Exercises     Shoulder Instructions       General Comments      Pertinent Vitals/ Pain       Pain Assessment: No/denies pain  Home Living  Prior Functioning/Environment              Frequency  Min 2X/week        Progress Toward Goals  OT Goals(current goals can now be found in the care plan section)  Progress towards OT goals: Progressing toward goals  Acute Rehab OT Goals Patient Stated Goal: none stated OT Goal Formulation: With patient Time For Goal Achievement: 10/08/21 Potential to Achieve Goals: Good ADL Goals Pt Will Perform Lower Body Dressing: sit to/from stand;with min guard assist Pt Will Transfer to Toilet: with min guard  assist;ambulating Pt Will Perform Tub/Shower Transfer: with min guard assist;shower seat;ambulating;rolling walker  Plan Discharge plan remains appropriate;Frequency remains appropriate    Co-evaluation                 AM-PAC OT "6 Clicks" Daily Activity     Outcome Measure   Help from another person eating meals?: None Help from another person taking care of personal grooming?: None Help from another person toileting, which includes using toliet, bedpan, or urinal?: A Little Help from another person bathing (including washing, rinsing, drying)?: A Little Help from another person to put on and taking off regular upper body clothing?: None Help from another person to put on and taking off regular lower body clothing?: A Little 6 Click Score: 21    End of Session Equipment Utilized During Treatment: Gait belt;Rolling walker (2 wheels)  OT Visit Diagnosis: Unsteadiness on feet (R26.81);Other abnormalities of gait and mobility (R26.89);Repeated falls (R29.6);Muscle weakness (generalized) (M62.81);Pain   Activity Tolerance Patient tolerated treatment well   Patient Left in bed;with call bell/phone within reach;with bed alarm set   Nurse Communication Mobility status;Other (comment) (wound vac off upon arrival, consulted RN and restarted wound vac)        Time: 8937-3428 OT Time Calculation (min): 32 min  Charges: OT General Charges $OT Visit: 1 Visit OT Treatments $Self Care/Home Management : 23-37 mins  Lynnda Child, OTD, OTR/L Acute Rehab [phone]) [phone]   Kaylyn Lim 09/27/2021, 2:56 PM

## 2021-09-27 NOTE — Progress Notes (Signed)
Physical Therapy Treatment Patient Details Name: [name] MRN: [id number] DOB: 1956-08-15 Today's Date: 09/27/2021   History of Present Illness 65 y.o. M presenting to ED after fall on 11/20. s/p four compartment fasciotomy of LLE on 11/21. LLE fasciotomy wtihout debridement performed 11/25.    PT Comments    Pt demos improved tolerance to functional mobility today. Able to transfer to standing with mod I and amb short distance in room with CGA only.  Overall gait distance limited only secondary to L LE pain.  Pt will be safe to return home with assist from spouse once medically cleared for D/C.  OP therapy recommended to return to PLOF.   Recommendations for follow up therapy are one component of a multi-disciplinary discharge planning process, led by the attending physician.  Recommendations may be updated based on patient status, additional functional criteria and insurance authorization.  Follow Up Recommendations  Outpatient PT     Assistance Recommended at Discharge PRN  Equipment Recommendations       Recommendations for Other Services       Precautions / Restrictions Precautions Precautions: None Restrictions Weight Bearing Restrictions: Yes LLE Weight Bearing: Weight bearing as tolerated     Mobility  Bed Mobility Overal bed mobility: Independent Bed Mobility: Supine to Sit;Sit to Supine     Supine to sit: Independent Sit to supine: Independent     Patient Response: Cooperative  Transfers   Equipment used: Rolling walker (2 wheels) Transfers: Sit to/from Stand Sit to Stand: Modified independent (Device/Increase time)           General transfer comment: Pt declines to sit up in chair.  Wants to return BTB.    Ambulation/Gait Ambulation/Gait assistance: Supervision Gait Distance (Feet): 10 Feet Assistive device: Rolling walker (2 wheels) Gait Pattern/deviations: Step-to pattern;Decreased stance time - left       General Gait Details: Pt.  amb with slow cadence in room. C/o inc pain in L LE limiting gait distance. Pt. able to perform amb with appropriate sequencing and CGA only.   Stairs             Wheelchair Mobility    Modified Rankin (Stroke Patients Only)       Balance Overall balance assessment: Modified Independent   Sitting balance-Leahy Scale: Good       Standing balance-Leahy Scale: Fair                              Cognition Arousal/Alertness: Awake/alert Behavior During Therapy: WFL for tasks assessed/performed Overall Cognitive Status: Within Functional Limits for tasks assessed                                 General Comments: Pt. is supine in bed when PT arrives.  Agreeable to work with PT.  Wants to attempt to amb around room.        Exercises      General Comments        Pertinent Vitals/Pain Pain Assessment: 0-10 Pain Score: 6  Pain Location: L LE Pain Descriptors / Indicators: Aching;Throbbing;Discomfort Pain Intervention(s): Limited activity within patient's tolerance    Home Living                          Prior Function            PT Goals (  current goals can now be found in the care plan section) Progress towards PT goals: Progressing toward goals    Frequency           PT Plan Current plan remains appropriate    Co-evaluation              AM-PAC PT "6 Clicks" Mobility   Outcome Measure  Help needed turning from your back to your side while in a flat bed without using bedrails?: None Help needed moving from lying on your back to sitting on the side of a flat bed without using bedrails?: None Help needed moving to and from a bed to a chair (including a wheelchair)?: None Help needed standing up from a chair using your arms (e.g., wheelchair or bedside chair)?: None Help needed to walk in hospital room?: A Little Help needed climbing 3-5 steps with a railing? : A Little 6 Click Score: 22    End of Session  Equipment Utilized During Treatment: Gait belt Activity Tolerance: Patient tolerated treatment well;Patient limited by pain Patient left: in bed;with call bell/phone within reach         Time: 0920-0934 PT Time Calculation (min) (ACUTE ONLY): 14 min  Charges:  $Gait Training: 8-22 mins                     Izabela Ow A. Laurajean Hosek, PT, DPT Acute Rehabilitation Services Office: [place] 09/27/2021, 9:42 AM

## 2021-09-27 NOTE — Discharge Instructions (Signed)
Keep your left leg dressing clean and dry.

## 2021-09-28 LAB — PROTIME-INR
INR: 1.1 (ref 0.8–1.2)
Prothrombin Time: 13.7 seconds (ref 11.4–15.2)

## 2021-09-28 MED ORDER — OXYCODONE HCL 5 MG PO TABS: 5.0000 mg | ORAL_TABLET | ORAL | 0 refills | Status: DC | PRN

## 2021-09-28 NOTE — Progress Notes (Signed)
Patient ID: [name], male   DOB: 10-Oct-1956, 64 y.o.   MRN: [id number] The patient is postop day 2 status post a biologic synthetic skin graft placement to his left leg wound.  The VAC is in place and has a good seal changed to [place] today. Overall doing well from HR standpoint. Plan for DC today and followup with Dr. Ninfa Linden

## 2021-09-28 NOTE — Discharge Summary (Signed)
Patient ID: [name] MRN: [id number] DOB/AGE: 66-Dec-1957 66 y.o.  Admit date: 09/22/2021 Discharge date: 09/28/2021  Admission Diagnoses:  Compartment syndrome of left lower extremity [facility])  Discharge Diagnoses:  Principal Problem:   Compartment syndrome of left lower extremity ([place]) Active Problems:   Status post surgery   Compartment syndrome of left upper extremity ([place])   Past Medical History:  Diagnosis Date   Acute on chronic systolic (congestive) heart failure (HCC) 12/30/2018   Allergy    Arthritis    Atrial flutter with rapid ventricular response (HCC)    Chronic systolic (congestive) heart failure (HCC)    Dilated aortic root ([place]) 01/04/2019   Dilated cardiomyopathy ([place]) 01/04/2019   GERD (gastroesophageal reflux disease)    history of   Heart murmur    History of colon polyps    Hypertension    Insomnia    Mitral valve prolapse    Mitral valve regurgitation    Persistent atrial fibrillation ([place]) 12/02/2018   S/P Maze operation for atrial fibrillation 03/21/2019   Complete bilateral atrial lesion set using cryothermy and bipolar radiofrequency ablation with clipping of LA appendage via right mini thoracotomy approach   S/P minimally invasive mitral valve repair 03/21/2019   Complex valvuloplasty including triangular resection of posterior leaflet, artificial Gore-tex neochord placement x4 and 36 mm Sorin Memo 4D ring annuloplasty via right mini thoracotomy approach   Seizures ([place])    had one approx. 30 yrs ago,has not had any since   Severe mitral regurgitation     Surgeries: Procedure(s): REPEAT IRRIGATION AND DEBRIDEMENT LEFT LEG, POSSIBLE WOUND CLOSURE, POSSIBLE VAC CHANGE, POSSIBLE SKIN GRAFT on 09/26/2021   Consultants (if any):   Discharged Condition: Improved  Hospital Course: [name] is an 65 y.o. male who was admitted 09/22/2021 with a diagnosis of Compartment syndrome of left lower extremity ([place]) and went to the operating room on  09/26/2021 and underwent the above named procedures.    He was given perioperative antibiotics:  Anti-infectives (From admission, onward)    Start     Dose/Rate Route Frequency Ordered Stop   09/23/21 1400  ceFAZolin (ANCEF) IVPB 2g/100 mL premix        2 g 200 mL/hr over 30 Minutes Intravenous Every 8 hours 09/23/21 1250     09/23/21 0400  ceFAZolin (ANCEF) IVPB 1 g/50 mL premix  Status:  Discontinued        1 g 100 mL/hr over 30 Minutes Intravenous Every 6 hours 09/22/21 2335 09/23/21 1258     .  He was given sequential compression devices, early ambulation, and appropriate chemoprophylaxis for DVT prophylaxis.  He benefited maximally from the hospital stay and there were no complications.    Recent vital signs:  Vitals:   09/28/21 0419 09/28/21 0838  BP: 116/88 129/86  Pulse: (!) 101 (!) 110  Resp: 17 16  Temp: 98.4 F (36.9 C) 98.1 F (36.7 C)  SpO2: 100% 98%    Recent laboratory studies:  Lab Results  Component Value Date   HGB 9.6 (L) 09/26/2021   HGB 7.8 (L) 09/25/2021   HGB 8.9 (L) 09/24/2021   Lab Results  Component Value Date   WBC 8.5 09/26/2021   PLT 184 09/26/2021   Lab Results  Component Value Date   INR 1.1 09/28/2021   Lab Results  Component Value Date   NA 134 (L) 09/24/2021   K 4.3 09/24/2021   CL 100 09/24/2021   CO2 29 09/24/2021  BUN 24 (H) 09/24/2021   CREATININE 1.23 09/24/2021   GLUCOSE 146 (H) 09/24/2021    Discharge Medications:   Allergies as of 09/28/2021   No Known Allergies      Medication List     STOP taking these medications    HYDROcodone-acetaminophen 10-325 MG tablet Commonly known as: NORCO       TAKE these medications    ALPRAZolam 0.25 MG tablet Commonly known as: XANAX Take 0.25 mg by mouth 2 (two) times daily.   aspirin 81 MG EC tablet Take 1 tablet (81 mg total) by mouth daily.   atorvastatin 20 MG tablet Commonly known as: LIPITOR Take 20 mg by mouth daily.   diltiazem 2 % Gel Apply 1  application topically 3 (three) times daily. With 5 % lidocaine   docusate sodium 100 MG capsule Commonly known as: COLACE Take 300 mg by mouth daily.   losartan 50 MG tablet Commonly known as: COZAAR Take 1 tablet by mouth once daily   Melatonin 10 MG Tabs Take 10 mg by mouth at bedtime.   metoprolol tartrate 25 MG tablet Commonly known as: LOPRESSOR Take 1/2 (one-half) tablet by mouth twice daily What changed: See the new instructions.   multivitamin with minerals Tabs tablet Take 1 tablet by mouth daily.   oxyCODONE 5 MG immediate release tablet Commonly known as: Oxy IR/ROXICODONE Take 1-2 tablets (5-10 mg total) by mouth every 4 (four) hours as needed for moderate pain (pain score 4-6).   potassium chloride 10 MEQ tablet Commonly known as: KLOR-CON TAKE 1  BY MOUTH TWICE DAILY What changed: See the new instructions.   spironolactone 25 MG tablet Commonly known as: ALDACTONE TAKE 1 TABLET BY MOUTH ONCE DAILY . APPOINTMENT REQUIRED FOR FUTURE REFILLS What changed: See the new instructions.   torsemide 20 MG tablet Commonly known as: Demadex Take 1 tablet (20 mg total) by mouth daily.   warfarin 5 MG tablet Commonly known as: COUMADIN Take as directed. If you are unsure how to take this medication, talk to your nurse or doctor. Original instructions: TAKE 1 TABLET BY MOUTH ONCE DAILY AS DIRECTED BY  COUMADIN  CLINIC What changed: See the new instructions.        Diagnostic Studies: DG Tibia/Fibula Left  Result Date: 09/21/2021 CLINICAL DATA:  Pt had a fall last night. Hit left tibia on night stand. Bruising on left lateral tib/fib. Hx of left knee replacement and ORIF EXAM: LEFT TIBIA AND FIBULA - 2 VIEW COMPARISON:  None. FINDINGS: Status post total knee replacement. There is an intramedullary rod with screw fixation in the distal tibia. There is no evidence of fracture or dislocation none there is focal soft tissue. Severe degenerative changes in the ankle. Is  swelling in the lateral aspect of the calf. IMPRESSION: No acute osseous abnormality.  Intact orthopedic hardware. Electronically Signed   By: Audie Pinto M.D.   On: 09/21/2021 11:54   CT Head Wo Contrast  Result Date: 09/21/2021 CLINICAL DATA:  08/12/2019 EXAM: CT HEAD WITHOUT CONTRAST TECHNIQUE: Contiguous axial images were obtained from the base of the skull through the vertex without intravenous contrast. COMPARISON:  08/12/2019 FINDINGS: Brain: No acute intracranial hemorrhage. No midline shift or mass effect. Gray-white differentiation maintained. Unremarkable appearance of the ventricular system. Vascular: Unremarkable. Skull: No acute fracture.  No aggressive bone lesion identified. Sinuses/Orbits: Unremarkable appearance of the orbits. Mastoid air cells clear. No middle ear effusion. No significant sinus disease. Other: Defect within the anterior nasal septum,  which can be a consequence of chronic vaso constrictive substances. Interval resolution of the prior soft tissue swelling in the scalp IMPRESSION: Negative for acute intracranial abnormality Electronically Signed   By: Corrie Mckusick D.O.   On: 09/21/2021 11:47   CT ANGIO LOW EXTREM LEFT W &/OR WO CONTRAST  Result Date: 09/22/2021 CLINICAL DATA:  Recent leg injury with increasing swelling. EXAM: CT ANGIOGRAPHY OF THE bilateral lowerEXTREMITY TECHNIQUE: Multidetector CT imaging of the bilateral lowerwas performed using the standard protocol during bolus administration of intravenous contrast. Multiplanar CT image reconstructions and MIPs were obtained to evaluate the vascular anatomy. CONTRAST:  167mL OMNIPAQUE IOHEXOL 350 MG/ML SOLN COMPARISON:  None. FINDINGS: Evaluation is limited due to streak artifact caused by bilateral total knee arthroplasties as well as right hip arthroplasty. The common iliac arteries, internal and external iliac arteries, common femoral artery, deep and superficial femoral arteries appear patent bilateral. The  popliteal arteries suboptimally evaluated due to streak artifact caused by knee arthroplasty. The visualized portions however appear patent. There is moderate atherosclerotic calcification of the calf arteries. Evaluation of the calf arteries is limited due to timing of the contrast. The calf arteries however appear patent to the level of distal calf above the ankle. There is a large subcutaneous hematoma in the posterior and lateral left calf measuring approximately 5 x 12 cm in greatest axial dimensions and 15 cm in craniocaudal length. Several foci of contrast blush within this hematoma noted consistent with active bleed. There is no acute fracture or dislocation. The bones are osteopenic. Status post prior ORIF of the distal left tibia and ankle. Bilateral total knee arthroplasties as well as right hip arthroplasty noted. The hardware are intact. There is sigmoid diverticulosis. Review of the MIP images confirms the above findings. IMPRESSION: 1. Large subcutaneous hematoma in the posterior and lateral left calf with evidence of active bleed. 2. No acute fracture or dislocation. These results were called by telephone at the time of interpretation on 09/22/2021 at 8:11 pm to Dr. Maryan Rued who verbally acknowledged these results. Electronically Signed   By: Anner Crete M.D.   On: 09/22/2021 20:21   CT ANGIO LOW EXTREM RIGHT W &/OR WO CONTRAST  Result Date: 09/22/2021 CLINICAL DATA:  Recent leg injury with increasing swelling. EXAM: CT ANGIOGRAPHY OF THE bilateral lowerEXTREMITY TECHNIQUE: Multidetector CT imaging of the bilateral lowerwas performed using the standard protocol during bolus administration of intravenous contrast. Multiplanar CT image reconstructions and MIPs were obtained to evaluate the vascular anatomy. CONTRAST:  140mL OMNIPAQUE IOHEXOL 350 MG/ML SOLN COMPARISON:  None. FINDINGS: Evaluation is limited due to streak artifact caused by bilateral total knee arthroplasties as well as right  hip arthroplasty. The common iliac arteries, internal and external iliac arteries, common femoral artery, deep and superficial femoral arteries appear patent bilateral. The popliteal arteries suboptimally evaluated due to streak artifact caused by knee arthroplasty. The visualized portions however appear patent. There is moderate atherosclerotic calcification of the calf arteries. Evaluation of the calf arteries is limited due to timing of the contrast. The calf arteries however appear patent to the level of distal calf above the ankle. There is a large subcutaneous hematoma in the posterior and lateral left calf measuring approximately 5 x 12 cm in greatest axial dimensions and 15 cm in craniocaudal length. Several foci of contrast blush within this hematoma noted consistent with active bleed. There is no acute fracture or dislocation. The bones are osteopenic. Status post prior ORIF of the distal left tibia and ankle.  Bilateral total knee arthroplasties as well as right hip arthroplasty noted. The hardware are intact. There is sigmoid diverticulosis. Review of the MIP images confirms the above findings. IMPRESSION: 1. Large subcutaneous hematoma in the posterior and lateral left calf with evidence of active bleed. 2. No acute fracture or dislocation. These results were called by telephone at the time of interpretation on 09/22/2021 at 8:11 pm to Dr. Maryan Rued who verbally acknowledged these results. Electronically Signed   By: Anner Crete M.D.   On: 09/22/2021 20:21    Disposition: Discharge disposition: 01-Home or Self Care          Follow-up Information     Mcarthur Rossetti, MD. Schedule an appointment as soon as possible for a visit on 10/01/2021.   Specialty: Orthopedic Surgery Why: Call to make appointment to be seen in the office on 10/01/21. Contact information: [address] [place] [address] [phone]                  Signed: Vanetta Mulders 09/28/2021,  12:11 PM

## 2021-09-29 ENCOUNTER — Encounter (HOSPITAL_COMMUNITY): Payer: Self-pay | Admitting: Orthopaedic Surgery

## 2021-10-01 ENCOUNTER — Encounter: Payer: Self-pay | Admitting: Orthopaedic Surgery

## 2021-10-01 ENCOUNTER — Ambulatory Visit (INDEPENDENT_AMBULATORY_CARE_PROVIDER_SITE_OTHER): Payer: Medicare HMO | Admitting: Orthopaedic Surgery

## 2021-10-01 DIAGNOSIS — S81802D Unspecified open wound, left lower leg, subsequent encounter: Secondary | ICD-10-CM

## 2021-10-01 NOTE — Progress Notes (Signed)
[name] comes in today for follow-up after having a skin graft of the left lateral aspect of his leg following fasciotomies for compartment syndrome of the left leg.  This happened after a significant contusion to his left leg last week.  He is on blood thinning medication and this led to bleeding the then led to compartment syndrome.  We performed fasciotomies and an I&D.  A skin graft was then placed with synthetic graft material 5 days ago.  He has had a Prevena wound VAC over the skin graft site of the left leg.  He does report significant pain.  His leg is soft on the left side.  There is no further evidence of compartment syndrome.  I did remove the wound VAC and the skin graft thus far shows a good take.  We will have Xeroform placed on this wound daily and I would like to reevaluate the wound in 1 week.  His wife understands how we need to have this changed each day.  All question concerns were answered and addressed.

## 2021-10-02 DIAGNOSIS — U071 COVID-19: Secondary | ICD-10-CM

## 2021-10-02 HISTORY — DX: COVID-19: U07.1

## 2021-10-04 ENCOUNTER — Encounter: Payer: Self-pay | Admitting: Orthopaedic Surgery

## 2021-10-06 ENCOUNTER — Other Ambulatory Visit: Payer: Self-pay | Admitting: Orthopaedic Surgery

## 2021-10-06 ENCOUNTER — Telehealth: Payer: Self-pay | Admitting: Orthopaedic Surgery

## 2021-10-06 MED ORDER — OXYCODONE HCL 5 MG PO TABS: 5.0000 mg | ORAL_TABLET | ORAL | 0 refills | Status: DC | PRN

## 2021-10-06 NOTE — Telephone Encounter (Signed)
Pt called requesting a call back from Dr. Ninfa Linden office about his pain. Pt states since surgery pain doesn't seem to be getting better and need a call back. Please call pt at [phone].

## 2021-10-06 NOTE — Telephone Encounter (Signed)
Called got answering machine told him I would try to reach him tomorrow morning.

## 2021-10-06 NOTE — Telephone Encounter (Signed)
Please advise 

## 2021-10-07 ENCOUNTER — Telehealth: Payer: Self-pay

## 2021-10-07 ENCOUNTER — Other Ambulatory Visit: Payer: Self-pay | Admitting: Physician Assistant

## 2021-10-07 MED ORDER — METHOCARBAMOL 500 MG PO TABS: 500.0000 mg | ORAL_TABLET | Freq: Three times a day (TID) | ORAL | 1 refills | Status: DC

## 2021-10-07 NOTE — Telephone Encounter (Signed)
noted 

## 2021-10-07 NOTE — Telephone Encounter (Signed)
Patients wife called wanting to move her husbands apt to tomorrow due to a conflict Thursday.   Please advise

## 2021-10-08 ENCOUNTER — Other Ambulatory Visit: Payer: Self-pay | Admitting: Physician Assistant

## 2021-10-08 MED ORDER — METHOCARBAMOL 500 MG PO TABS: 500.0000 mg | ORAL_TABLET | Freq: Three times a day (TID) | ORAL | 1 refills | Status: DC

## 2021-10-08 NOTE — Telephone Encounter (Signed)
LMOM for wife to call back if they still need to be seen this afternoon

## 2021-10-09 ENCOUNTER — Ambulatory Visit (INDEPENDENT_AMBULATORY_CARE_PROVIDER_SITE_OTHER): Payer: Medicare HMO | Admitting: Orthopaedic Surgery

## 2021-10-09 ENCOUNTER — Encounter: Payer: Self-pay | Admitting: Orthopaedic Surgery

## 2021-10-09 DIAGNOSIS — S81802D Unspecified open wound, left lower leg, subsequent encounter: Secondary | ICD-10-CM

## 2021-10-09 MED ORDER — OXYCODONE HCL 5 MG PO TABS: 5.0000 mg | ORAL_TABLET | ORAL | 0 refills | Status: DC | PRN

## 2021-10-09 MED ORDER — PREGABALIN 75 MG PO CAPS: 75.0000 mg | ORAL_CAPSULE | Freq: Two times a day (BID) | ORAL | 2 refills | Status: DC

## 2021-10-09 NOTE — Progress Notes (Signed)
[name] comes in today with for continued follow-up status post significant left lower extremity trauma which caused compartment syndrome.  He had a large fasciotomy and ended up having a skin graft placed on that leg.  He is still having quite debilitating pain.  He is on oxycodone and a muscle relaxant.  That is really not helping.  He cannot take anti-inflammatories since he is on a blood thinner.  On examination the skin graft has really good take.  There is a large eschar posterior to that.  There is no evidence of infection.  We will have him try the compressive socks that are silver-based with Dr. Sharol Given develop.  He will wear socks all day long and remove that and change it out and I like to see him back in 1 week for reassessment of the wound.  I will try Lyrica to see if this will help with his pain as well.  I will send in some more oxycodone as well.

## 2021-10-10 ENCOUNTER — Other Ambulatory Visit: Payer: Self-pay

## 2021-10-10 ENCOUNTER — Ambulatory Visit (INDEPENDENT_AMBULATORY_CARE_PROVIDER_SITE_OTHER): Payer: Medicare HMO

## 2021-10-10 DIAGNOSIS — Z5181 Encounter for therapeutic drug level monitoring: Secondary | ICD-10-CM

## 2021-10-10 DIAGNOSIS — I4819 Other persistent atrial fibrillation: Secondary | ICD-10-CM | POA: Diagnosis not present

## 2021-10-10 DIAGNOSIS — I4892 Unspecified atrial flutter: Secondary | ICD-10-CM | POA: Diagnosis not present

## 2021-10-10 DIAGNOSIS — Z7901 Long term (current) use of anticoagulants: Secondary | ICD-10-CM | POA: Diagnosis not present

## 2021-10-10 LAB — POCT INR: INR: 1.6 — AB (ref 2.0–3.0)

## 2021-10-10 NOTE — Patient Instructions (Signed)
TAKE 2 tablets tonight only and then Continue taking 1 tablet daily except 1.5 tablets on Wednesdays.  [phone] INR check 8 weeks per patient

## 2021-10-14 ENCOUNTER — Other Ambulatory Visit: Payer: Self-pay | Admitting: Orthopaedic Surgery

## 2021-10-14 ENCOUNTER — Telehealth: Payer: Self-pay | Admitting: Orthopaedic Surgery

## 2021-10-14 MED ORDER — OXYCODONE HCL 5 MG PO TABS: 5.0000 mg | ORAL_TABLET | ORAL | 0 refills | Status: DC | PRN

## 2021-10-14 NOTE — Telephone Encounter (Signed)
Pt called and states he tested positive for covid on Saturday but pt is no longer having any symptoms. He was suppose to have an appt tomorrow at 8:45 is that okay or push him out?   CB [phone]

## 2021-10-14 NOTE — Telephone Encounter (Signed)
Called pt and advised.  

## 2021-10-15 ENCOUNTER — Ambulatory Visit (INDEPENDENT_AMBULATORY_CARE_PROVIDER_SITE_OTHER): Payer: Medicare HMO | Admitting: Orthopaedic Surgery

## 2021-10-15 ENCOUNTER — Encounter: Payer: Medicare HMO | Admitting: Orthopaedic Surgery

## 2021-10-15 ENCOUNTER — Other Ambulatory Visit: Payer: Self-pay

## 2021-10-15 ENCOUNTER — Encounter: Payer: Self-pay | Admitting: Orthopaedic Surgery

## 2021-10-15 DIAGNOSIS — S81802D Unspecified open wound, left lower leg, subsequent encounter: Secondary | ICD-10-CM

## 2021-10-15 NOTE — Progress Notes (Signed)
[name] comes in today for continued follow-up of his left leg wound from fasciotomies and synthetic skin grafting.  He has been wearing compressive socks that are silver laced.  This has been helping debride the wounds.  There is still large black eschar but underneath there is nice and viable tissue that has good granulation and looks good overall.  His calf is smaller and is no evidence of infection.  He is weightbearing as tolerated and can shower and get the leg wet daily.  He will then dry it off for a while and put the compressive socks back on.  If he runs low on pain medications next week we will have [name] refill his medicines.  I would like to see him back in 2 weeks for wound check.  All questions and concerns were answered and addressed.

## 2021-10-19 ENCOUNTER — Other Ambulatory Visit: Payer: Self-pay | Admitting: Orthopaedic Surgery

## 2021-10-20 ENCOUNTER — Other Ambulatory Visit: Payer: Self-pay | Admitting: Cardiovascular Disease

## 2021-10-20 MED ORDER — OXYCODONE HCL 5 MG PO TABS: 5.0000 mg | ORAL_TABLET | ORAL | 0 refills | Status: DC | PRN

## 2021-10-24 ENCOUNTER — Encounter: Payer: Self-pay | Admitting: Orthopaedic Surgery

## 2021-10-24 ENCOUNTER — Ambulatory Visit: Payer: Medicare HMO

## 2021-10-24 ENCOUNTER — Other Ambulatory Visit: Payer: Self-pay

## 2021-10-24 ENCOUNTER — Ambulatory Visit (INDEPENDENT_AMBULATORY_CARE_PROVIDER_SITE_OTHER): Payer: Medicare HMO | Admitting: Family

## 2021-10-24 ENCOUNTER — Encounter: Payer: Self-pay | Admitting: Family

## 2021-10-24 DIAGNOSIS — T79A22D Traumatic compartment syndrome of left lower extremity, subsequent encounter: Secondary | ICD-10-CM

## 2021-10-24 DIAGNOSIS — S81802D Unspecified open wound, left lower leg, subsequent encounter: Secondary | ICD-10-CM

## 2021-10-24 NOTE — Telephone Encounter (Signed)
Come in for nurse visit.  Looks like it can be treated with silvadene BID and let's put him on doxy 100 BID.  Follow up first thing Tuesday with them.

## 2021-10-24 NOTE — Progress Notes (Signed)
Office Visit Note   Patient: [name]           Date of Birth: [birth year]           MRN: [id number] Visit Date: 10/24/2021              Requested by: [name], MD [address] [place],   [id number] PCP: [name], MD  Chief Complaint  Patient presents with   Left Leg - Wound Check      HPI: The patient is a 65 year old gentleman seen today for evaluation of left leg ulcer.  He has had a myriad skin graft placed by Dr. Ninfa Linden.  Last I and D was 09/26/22.  In today for concern of increased drainage swelling over the last few days and some increased eschar on the back of his calf.  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  Assessment & Plan: Visit Diagnoses: No diagnosis found.  Plan: Reassurance provided  he will continue with daily Dial soap cleansing.  Changing his medical compression stocking daily with direct skin contact.  Did instruct the patient on padding outside the sock with gauze or ABD pads to collect the drainage.  Discussed the natural course and that the more he is on his feet the more swelling and drainage he will have.  Follow-Up Instructions: Return As scheduled.   Ortho Exam  Patient is alert, oriented, no adenopathy, well-dressed, normal affect, normal respiratory effort. Please see attached image. Large ulcer to the left calf this appears stable.  There is about 30% fibrinous exudative tissue over the area of the graft with with 70% granulation tissue.  There is no surrounding erythema warmth.  No purulence no odor   Imaging: No results found.    Labs: Lab Results  Component Value Date   HGBA1C 5.2 03/16/2019     Lab Results  Component Value Date   ALBUMIN 4.1 09/22/2021   ALBUMIN 3.5 03/16/2019   ALBUMIN 2.9 (L) 01/03/2019    Lab Results  Component Value Date   MG 2.1 06/19/2019   MG 2.2 03/25/2019   MG 2.8 (H) 03/22/2019    No results found for: VD25OH  No results found for: PREALBUMIN CBC EXTENDED Latest Ref Rng & Units 09/26/2021 09/25/2021 09/24/2021  WBC 4.0 - 10.5 K/uL 8.5 9.7 13.2(H)  RBC 4.22 - 5.81 MIL/uL 3.10(L) 2.53(L) 2.82(L)  HGB 13.0 - 17.0 g/dL 9.6(L) 7.8(L) 8.9(L)  HCT 39.0 - 52.0 % 29.2(L) 24.2(L) 26.6(L)  PLT 150 - 400 K/uL 184 119(L) 148(L)  NEUTROABS 1.7 - 7.7 K/uL - - -  LYMPHSABS 0.7 - 4.0 K/uL - - -     There is no height or weight on file to calculate BMI.  Orders:  No orders of the defined types were placed in this encounter.  No orders of the defined types were placed in this encounter.    Procedures: No procedures performed  Clinical Data: No additional findings.  ROS:  All other systems negative, except as noted in the HPI. Review of Systems  Objective: Vital Signs: There were no vitals taken for this visit.  Specialty Comments:  No specialty comments available.  PMFS History: Patient Active Problem List   Diagnosis Date Noted   Compartment syndrome of left lower extremity ([place]) 09/22/2021   Status post surgery 09/22/2021   Compartment syndrome of left upper extremity ([place])  09/22/2021   Hyperlipidemia 12/13/2020   Atrial fibrillation ([place]) 06/10/2020   Long term (current) use of anticoagulants 06/10/2020   Secondary hypercoagulable state ([place]) 04/24/2020   AV junctional bradycardia 03/22/2019   S/P minimally invasive mitral valve repair  03/21/2019   S/P Maze operation for atrial fibrillation 03/21/2019   Dilated aortic root ([place]) 01/04/2019   Dilated cardiomyopathy ([place]) 01/04/2019   Atrial flutter with rapid ventricular response ([place])    Mitral valve prolapse    Hypertensive urgency 12/30/2018   Acute on chronic systolic (congestive) heart failure ([place]) 75/17/0017   Acute systolic heart failure ([place]) 49/44/9675   Chronic systolic (congestive) heart failure (H[facility])    Persistent atrial fibrillation ([place]) 12/02/2018   Right hamstring muscle strain  07/20/2018   Essential hypertension 02/07/2016   Severe mitral regurgitation    Annual physical exam 11/16/2014   Arthritis of left knee 10/23/2014   Status post total left knee replacement 10/23/2014   Arthritis of knee, right 01/19/2014   Status post total knee replacement 01/19/2014   Benign paroxysmal positional vertigo 10/30/2013   Testicular hypofunction 03/24/2012   Past Medical History:  Diagnosis Date   Acute on chronic systolic (congestive) heart failure ([place]) 12/30/2018   Allergy    Arthritis    Atrial flutter with rapid ventricular response (H[facility])    Chronic systolic (congestive) heart failure ([facility])    Dilated aortic root ([place]) 01/04/2019   Dilated cardiomyopathy ([place]) 01/04/2019   GERD (gastroesophageal reflux disease)    history of   Heart murmur    History of colon polyps    Hypertension    Insomnia    Mitral valve prolapse    Mitral valve regurgitation    Persistent atrial fibrillation ([place]) 12/02/2018   S/P Maze operation for atrial fibrillation 03/21/2019   Complete bilateral atrial lesion set using cryothermy and bipolar radiofrequency ablation with clipping of LA appendage via right mini thoracotomy approach   S/P minimally invasive mitral valve repair 03/21/2019   Complex valvuloplasty including triangular resection of posterior leaflet, artificial Gore-tex neochord placement x4 and 36 mm Sorin Memo 4D ring annuloplasty via right mini thoracotomy approach   Seizures ([place])    had one approx. 30 yrs ago,has not had any since   Severe mitral regurgitation     Family History  Problem Relation Age of Onset   Hypertension Father    Colon cancer Father        dx in his late 75's   Diabetes Mother    Stomach cancer Neg Hx    Esophageal cancer Neg Hx    Pancreatic cancer Neg Hx    Rectal cancer Neg Hx     Past Surgical History:  Procedure Laterality Date   ANKLE FUSION  left   Dec. 9163   APPLICATION OF WOUND VAC Left 09/22/2021   Procedure: APPLICATION OF  WOUND VAC;  Surgeon: Mcarthur Rossetti, MD;  Location: [facility];  Service: Orthopedics;  Laterality: Left;   ARTHROSCOPY KNEE W/ DRILLING  bilateral   2012   CARDIAC VALVE REPLACEMENT     CARDIOVERSION N/A 01/03/2019   Procedure: CARDIOVERSION;  Surgeon: Sueanne Margarita, MD;  Location[facility] ENDOSCOPY;  Service: Cardiovascular;  Laterality: N/A;   CARDIOVERSION N/A 06/23/2019   Procedure: CARDIOVERSION;  Surgeon: Fay Records, MD;  Location: [facility] ENDOSCOPY;  Service: Cardiovascular;  Laterality: N/A;   CLIPPING OF ATRIAL APPENDAGE  03/21/2019   Procedure: Clipping Of Atrial Appendage using 54mm Atricure Pro2 Clip;  Surgeon: Darylene Price  H, MD;  Location: [facility];  Service: Open Heart Surgery;;   COLONOSCOPY     x2   FOOT ARTHRODESIS  06/23/2012   Procedure: ARTHRODESIS FOOT;  Surgeon: Wylene Simmer, MD;  Location: [facility];  Service: Orthopedics;  Laterality: Left;  Left Subtalar and Talonavicular Joint Revision Arthrodesis  Aspiration of Bone Marrow from Left Hip    HAIR TRANSPLANT     HARDWARE REMOVAL  06/23/2012   Procedure: HARDWARE REMOVAL;  Surgeon: Wylene Simmer, MD;  Location: [facility];  Service: Orthopedics;  Laterality: Left;  Removal of Deep Implant  X's 3   I & D EXTREMITY Left 09/23/2021   Procedure: IRRIGATION AND DEBRIDEMENT OF LEG;  Surgeon: Mcarthur Rossetti, MD;  Location: [facility];  Service: Orthopedics;  Laterality: Left;   I & D EXTREMITY Left 09/26/2021   Procedure: REPEAT IRRIGATION AND DEBRIDEMENT LEFT LEG, POSSIBLE WOUND CLOSURE, POSSIBLE VAC CHANGE, POSSIBLE SKIN GRAFT;  Surgeon: Mcarthur Rossetti, MD;  Location: [facility];  Service: Orthopedics;  Laterality: Left;   INCISION AND DRAINAGE WOUND WITH FASCIOTOMY Left 09/22/2021   Procedure: INCISION AND DRAINAGE WOUND WITH FASCIOTOMY;  Surgeon: Mcarthur Rossetti, MD;  Location: [facility];  Service: Orthopedics;  Laterality: Left;   INSERTION OF MESH N/A 07/10/2016   Procedure: INSERTION OF MESH;  Surgeon: Coralie Keens, MD;   Location: [facility];  Service: General;  Laterality: N/A;   JOINT REPLACEMENT     right hip  01-2011   LEFT HEART CATH AND CORONARY ANGIOGRAPHY N/A 03/16/2019   Procedure: LEFT HEART CATH AND CORONARY ANGIOGRAPHY;  Surgeon: Sherren Mocha, MD;  Location: [facility] CV LAB;  Service: Cardiovascular;  Laterality: N/A;   LIMB SPARING RESECTION HIP W/ SADDLE JOINT REPLACEMENT Right    MINIMALLY INVASIVE MAZE PROCEDURE N/A 03/21/2019   Procedure: MINIMALLY INVASIVE MAZE PROCEDURE;  Surgeon: Rexene Alberts, MD;  Location: [facility];  Service: Open Heart Surgery;  Laterality: N/A;   MITRAL VALVE REPAIR Right 03/21/2019   Procedure: MINIMALLY INVASIVE MITRAL VALVE REPAIR (MVR) using Memo 4D ring size 36;  Surgeon: Rexene Alberts, MD;  Location: [facility];  Service: Open Heart Surgery;  Laterality: Right;   TEE WITHOUT CARDIOVERSION N/A 01/03/2019   Procedure: TRANSESOPHAGEAL ECHOCARDIOGRAM (TEE);  Surgeon: Sueanne Margarita, MD;  Location: [facility] ENDOSCOPY;  Service: Cardiovascular;  Laterality: N/A;   TEE WITHOUT CARDIOVERSION N/A 03/21/2019   Procedure: TRANSESOPHAGEAL ECHOCARDIOGRAM (TEE);  Surgeon: Rexene Alberts, MD;  Location: [facility];  Service: Open Heart Surgery;  Laterality: N/A;   TEMPORARY PACEMAKER N/A 03/22/2019   Procedure: TEMPORARY PACEMAKER;  Surgeon: Belva Crome, MD;  Location: [facility];  Service: Cardiovascular;  Laterality: N/A;   TOTAL KNEE ARTHROPLASTY Right 01/19/2014   Procedure: RIGHT TOTAL KNEE ARTHROPLASTY, Steroid injection left knee;  Surgeon: Mcarthur Rossetti, MD;  Location: [facility];  Service: Orthopedics;  Laterality: Right;   TOTAL KNEE ARTHROPLASTY Left 10/23/2014   Procedure: LEFT TOTAL KNEE ARTHROPLASTY;  Surgeon: Mcarthur Rossetti, MD;  Location: [facility];  Service: Orthopedics;  Laterality: Left;   UMBILICAL HERNIA REPAIR N/A 07/10/2016   Procedure: UMBILICAL HERNIA REPAIR;  Surgeon: Coralie Keens, MD;  Location: [facility];   Service: General;  Laterality: N/A;   Social History   Occupational History   Occupation: Pension scheme manager  Tobacco Use   Smoking status: Never   Smokeless tobacco: Never  Vaping Use   Vaping Use: Never used  Substance and Sexual Activity  Alcohol use: Yes    Alcohol/week: 2.0 - 4.0 standard drinks    Types: 1 - 2 Standard drinks or equivalent, 1 - 2 Glasses of wine per week    Comment: 1 or 2 drink daily   Drug use: No   Sexual activity: Yes

## 2021-10-24 NOTE — Telephone Encounter (Signed)
Per Junie Panning she will see this pt in her clinic today. Pt is on his way

## 2021-10-28 ENCOUNTER — Other Ambulatory Visit: Payer: Self-pay

## 2021-10-28 ENCOUNTER — Other Ambulatory Visit: Payer: Self-pay | Admitting: Orthopaedic Surgery

## 2021-10-28 ENCOUNTER — Encounter: Payer: Self-pay | Admitting: Orthopaedic Surgery

## 2021-10-28 ENCOUNTER — Ambulatory Visit (INDEPENDENT_AMBULATORY_CARE_PROVIDER_SITE_OTHER): Payer: Medicare HMO | Admitting: Orthopaedic Surgery

## 2021-10-28 DIAGNOSIS — S81802D Unspecified open wound, left lower leg, subsequent encounter: Secondary | ICD-10-CM

## 2021-10-28 DIAGNOSIS — T79A22D Traumatic compartment syndrome of left lower extremity, subsequent encounter: Secondary | ICD-10-CM

## 2021-10-28 MED ORDER — TIZANIDINE HCL 4 MG PO TABS: 4.0000 mg | ORAL_TABLET | Freq: Three times a day (TID) | ORAL | 0 refills | Status: DC | PRN

## 2021-10-28 MED ORDER — OXYCODONE HCL 5 MG PO TABS: 5.0000 mg | ORAL_TABLET | ORAL | 0 refills | Status: DC | PRN

## 2021-10-28 NOTE — Progress Notes (Signed)
The patient comes in today with continued follow-up as it relates to a significant left lower extremity wound status post synthetic skin grafting.  He is still not having good pain control but is ambulating better and the swelling is less.  On exam I did remove eschar from the posterior aspect of the wound with a #15 blade.  I placed Xeroform over the wound.  So far it is looking like it is healing but it is can be a long process.  He will continue his compressive socks that are silver-based.  I would like to see him back next week for repeat wound check.  I sent in some more oxycodone and some Zanaflex.

## 2021-10-29 ENCOUNTER — Encounter: Payer: Self-pay | Admitting: Orthopaedic Surgery

## 2021-10-31 ENCOUNTER — Other Ambulatory Visit: Payer: Self-pay | Admitting: Orthopaedic Surgery

## 2021-10-31 MED ORDER — OXYCODONE HCL 5 MG PO TABS
5.0000 mg | ORAL_TABLET | ORAL | 0 refills | Status: DC | PRN
Start: 2021-10-31 — End: 2021-11-05

## 2021-11-03 ENCOUNTER — Other Ambulatory Visit: Payer: Self-pay | Admitting: General Practice

## 2021-11-04 NOTE — Telephone Encounter (Signed)
Prescription refill request received for warfarin [place]: 12/13/[name])  Next INR check: 12/05/21  Warfarin tablet strength: 5mg   Appropriate dose and refill sent to requested pharmacy.

## 2021-11-05 ENCOUNTER — Ambulatory Visit (INDEPENDENT_AMBULATORY_CARE_PROVIDER_SITE_OTHER): Payer: Medicare HMO | Admitting: Orthopaedic Surgery

## 2021-11-05 DIAGNOSIS — S81802D Unspecified open wound, left lower leg, subsequent encounter: Secondary | ICD-10-CM

## 2021-11-05 MED ORDER — OXYCODONE HCL 5 MG PO TABS: 5.0000 mg | ORAL_TABLET | ORAL | 0 refills | Status: DC | PRN

## 2021-11-05 NOTE — Progress Notes (Signed)
[name] continues to follow-up with his left leg wound status post synthetic skin grafting with biologic skin graft material.  This may happen after compartment syndrome.  He has been working on wound care.  This is routine follow-up after I saw him last week.  He has been changing compression socks on a daily that have silver-based and wound care properties with a sock.  He is frustrated about his healing.  The wound actually looks way better to me today than it did last week.  There is no exposed bone or tendons.  It is healing appropriately and has good granulation tissue.  We can have him stop the sock at this point place Xeroform over the wound and change this every other day.  I would like to see him back in 2 weeks for a wound check.  We will continue refill his oxycodone.

## 2021-11-11 ENCOUNTER — Other Ambulatory Visit: Payer: Self-pay | Admitting: Orthopaedic Surgery

## 2021-11-11 MED ORDER — OXYCODONE HCL 5 MG PO TABS: 5.0000 mg | ORAL_TABLET | ORAL | 0 refills | Status: DC | PRN

## 2021-11-12 ENCOUNTER — Encounter: Payer: Medicare HMO | Admitting: Orthopaedic Surgery

## 2021-11-14 ENCOUNTER — Other Ambulatory Visit: Payer: Self-pay | Admitting: Orthopaedic Surgery

## 2021-11-14 MED ORDER — OXYCODONE HCL 5 MG PO TABS: 5.0000 mg | ORAL_TABLET | ORAL | 0 refills | Status: DC | PRN

## 2021-11-19 ENCOUNTER — Ambulatory Visit (INDEPENDENT_AMBULATORY_CARE_PROVIDER_SITE_OTHER): Payer: Medicare HMO | Admitting: Orthopaedic Surgery

## 2021-11-19 ENCOUNTER — Other Ambulatory Visit: Payer: Self-pay

## 2021-11-19 ENCOUNTER — Encounter: Payer: Self-pay | Admitting: Orthopaedic Surgery

## 2021-11-19 DIAGNOSIS — S81802D Unspecified open wound, left lower leg, subsequent encounter: Secondary | ICD-10-CM

## 2021-11-19 MED ORDER — OXYCODONE HCL 5 MG PO TABS: 5.0000 mg | ORAL_TABLET | Freq: Four times a day (QID) | ORAL | 0 refills | Status: DC | PRN

## 2021-11-19 NOTE — Progress Notes (Signed)
The patient is now 2 months into compartment syndrome involving his left leg that underwent fasciotomies and now is dealing with chronic wound care.  He had a biologic allograft placed to his left lateral leg wounds.  At this point we have had him placed Xeroform to his left leg wound daily and sometimes every other day.  On exam there is still weeping but there is no evidence of infection at all.  There is good granulation tissue all throughout the wound bed with no areas of necrosis at all.  I do feel that we need to try to get this to dry up at this standpoint.  I am going to have him place new Xeroform on the wound for the next week and just dry dressings.  I would like to see him back in 1 week for wound assessment.

## 2021-11-25 ENCOUNTER — Other Ambulatory Visit: Payer: Self-pay | Admitting: Orthopaedic Surgery

## 2021-11-25 MED ORDER — OXYCODONE HCL 5 MG PO TABS: 5.0000 mg | ORAL_TABLET | Freq: Four times a day (QID) | ORAL | 0 refills | Status: DC | PRN

## 2021-11-26 ENCOUNTER — Encounter: Payer: Self-pay | Admitting: Orthopaedic Surgery

## 2021-11-26 ENCOUNTER — Ambulatory Visit (INDEPENDENT_AMBULATORY_CARE_PROVIDER_SITE_OTHER): Payer: Medicare HMO | Admitting: Orthopaedic Surgery

## 2021-11-26 DIAGNOSIS — S81802D Unspecified open wound, left lower leg, subsequent encounter: Secondary | ICD-10-CM

## 2021-11-26 NOTE — Progress Notes (Signed)
[name] comes in today at 9 weeks status post biologic skin grafting with allograft to the left leg wound.  He says it has still been draining some but it is gotten much less.  On my exam today there is abundant granulation tissue but the draining is much less and there is no evidence of infection at all.  I given reassurance that this is how these wounds look with biologic graft and it should continue to improve with time.  I would just have him get it wet in shower with dry dressing afterwards.  At this point I will see him back in a month to see how he is doing overall.  I have counseled him about trying to wean from the oxycodone.  He was on chronic hydrocodone before this for his back.

## 2021-12-01 ENCOUNTER — Other Ambulatory Visit: Payer: Self-pay | Admitting: Orthopaedic Surgery

## 2021-12-01 MED ORDER — OXYCODONE HCL 5 MG PO TABS: 5.0000 mg | ORAL_TABLET | Freq: Four times a day (QID) | ORAL | 0 refills | Status: DC | PRN

## 2021-12-05 ENCOUNTER — Ambulatory Visit (INDEPENDENT_AMBULATORY_CARE_PROVIDER_SITE_OTHER): Payer: Medicare HMO

## 2021-12-05 ENCOUNTER — Other Ambulatory Visit: Payer: Self-pay

## 2021-12-05 ENCOUNTER — Other Ambulatory Visit: Payer: Self-pay | Admitting: Orthopaedic Surgery

## 2021-12-05 DIAGNOSIS — Z5181 Encounter for therapeutic drug level monitoring: Secondary | ICD-10-CM | POA: Diagnosis not present

## 2021-12-05 DIAGNOSIS — I4892 Unspecified atrial flutter: Secondary | ICD-10-CM

## 2021-12-05 DIAGNOSIS — I4819 Other persistent atrial fibrillation: Secondary | ICD-10-CM | POA: Diagnosis not present

## 2021-12-05 DIAGNOSIS — Z7901 Long term (current) use of anticoagulants: Secondary | ICD-10-CM

## 2021-12-05 LAB — POCT INR: INR: 2.6 (ref 2.0–3.0)

## 2021-12-05 MED ORDER — OXYCODONE HCL 5 MG PO TABS: 5.0000 mg | ORAL_TABLET | Freq: Four times a day (QID) | ORAL | 0 refills | Status: DC | PRN

## 2021-12-05 NOTE — Patient Instructions (Signed)
Continue taking 1 tablet daily except 1.5 tablets on Wednesdays.  [phone] INR check 8 weeks per patient

## 2021-12-08 ENCOUNTER — Other Ambulatory Visit: Payer: Self-pay | Admitting: Cardiovascular Disease

## 2021-12-11 ENCOUNTER — Other Ambulatory Visit: Payer: Self-pay | Admitting: Orthopaedic Surgery

## 2021-12-11 ENCOUNTER — Telehealth: Payer: Self-pay | Admitting: Orthopaedic Surgery

## 2021-12-11 MED ORDER — OXYCODONE HCL 5 MG PO TABS: 5.0000 mg | ORAL_TABLET | Freq: Four times a day (QID) | ORAL | 0 refills | Status: DC | PRN

## 2021-12-11 NOTE — Telephone Encounter (Signed)
Called pharmacy and cancelled rx. Pt was called and advised and stated understanding

## 2021-12-11 NOTE — Telephone Encounter (Signed)
Please advise 

## 2021-12-11 NOTE — Telephone Encounter (Signed)
Jaci Standard from [facility] called. Patient is receiving hydrocodone from another MD filled at another pharmacy. Does Dr. Ninfa Linden still want them to fill the RX for hydrocodone. Call back number is [phone]

## 2021-12-15 ENCOUNTER — Other Ambulatory Visit: Payer: Self-pay | Admitting: Cardiovascular Disease

## 2021-12-24 ENCOUNTER — Other Ambulatory Visit: Payer: Self-pay

## 2021-12-24 ENCOUNTER — Encounter: Payer: Self-pay | Admitting: Orthopaedic Surgery

## 2021-12-24 ENCOUNTER — Ambulatory Visit (INDEPENDENT_AMBULATORY_CARE_PROVIDER_SITE_OTHER): Payer: Medicare HMO | Admitting: Orthopaedic Surgery

## 2021-12-24 DIAGNOSIS — S81802D Unspecified open wound, left lower leg, subsequent encounter: Secondary | ICD-10-CM | POA: Diagnosis not present

## 2021-12-24 NOTE — Progress Notes (Signed)
[name] comes in today for continued treatment as a relates to chronic left leg wound after fasciotomies and allograft skin grafting.  He is having worsening drainage and swelling.  He is someone who works very hard and states he has not been told to stay off or elevate the leg.  He does elevated at night.  I had him stay on the exam table for a while with the leg elevated and it does help with some of the edema.  It is swollen for sure and there is drainage but no evidence of infection.  There is abundant granulation tissue.  I did speak with Dr. Jess Barters assistant Autumn and she has been up apply a Unna boot type of wrap to his left leg today.  I want him to elevate his legs throughout the day and stay off of is much as possible.  We will see him back in a week and have that wrap removed and see how he is doing overall.  I think is essential to get as much edema out of the leg as we can.

## 2021-12-26 ENCOUNTER — Other Ambulatory Visit: Payer: Self-pay

## 2021-12-26 ENCOUNTER — Telehealth: Payer: Self-pay

## 2021-12-26 ENCOUNTER — Other Ambulatory Visit: Payer: Self-pay | Admitting: Physician Assistant

## 2021-12-26 ENCOUNTER — Telehealth: Payer: Self-pay | Admitting: Orthopaedic Surgery

## 2021-12-26 NOTE — Telephone Encounter (Signed)
Per [name] pt needs to stop his blood thinner as of today. I tried to call pt but line was busy. I will try again later

## 2021-12-26 NOTE — Telephone Encounter (Signed)
Called and advised. He stated understanding

## 2021-12-26 NOTE — Telephone Encounter (Signed)
Called again, pt was advised and stated he would stop taking it

## 2021-12-26 NOTE — Telephone Encounter (Signed)
Please advise 

## 2021-12-26 NOTE — Telephone Encounter (Signed)
Patient called advised the wound on his left calf is leaking like it was when he was in the office Wednesday. Patient said the smell is pretty bad. Patient asked if he can go back to the way he was wrapping the wound? Patient asked for a call back as soon as possible. The number to contact patient before noon is [phone] and the number to contact patient after noon is [phone].

## 2021-12-29 ENCOUNTER — Encounter (HOSPITAL_COMMUNITY): Payer: Self-pay | Admitting: Orthopaedic Surgery

## 2021-12-29 ENCOUNTER — Other Ambulatory Visit: Payer: Self-pay

## 2021-12-29 NOTE — Progress Notes (Signed)
Spoke with pt's wife, Jocelyn Lamer for pre-op call. DPR on file. Pt is not diabetic. He is on Coumadin for A-fib. Jocelyn Lamer states she knows he has stopped the Coumadin but is not sure what day the last dose was. She states he will know when he arrives tomorrow.   Covid test on arrival.

## 2021-12-30 ENCOUNTER — Encounter (HOSPITAL_COMMUNITY)
Admission: AD | Disposition: A | Discharge status: Home / Self Care | Payer: Self-pay | Source: Home / Self Care | Attending: Orthopaedic Surgery

## 2021-12-30 ENCOUNTER — Other Ambulatory Visit: Payer: Self-pay

## 2021-12-30 ENCOUNTER — Ambulatory Visit (HOSPITAL_COMMUNITY): Payer: Medicare HMO | Admitting: Anesthesiology

## 2021-12-30 ENCOUNTER — Encounter (HOSPITAL_COMMUNITY): Payer: Self-pay | Admitting: Orthopaedic Surgery

## 2021-12-30 ENCOUNTER — Inpatient Hospital Stay (HOSPITAL_COMMUNITY)
Admission: AD | Admit: 2021-12-30 | Discharge: 2022-01-05 | DRG: 264 | Disposition: A | Discharge status: Home / Self Care | Payer: Medicare HMO | Attending: Orthopedic Surgery | Admitting: Orthopedic Surgery

## 2021-12-30 DIAGNOSIS — I872 Venous insufficiency (chronic) (peripheral): Secondary | ICD-10-CM | POA: Diagnosis present

## 2021-12-30 DIAGNOSIS — Z833 Family history of diabetes mellitus: Secondary | ICD-10-CM

## 2021-12-30 DIAGNOSIS — I96 Gangrene, not elsewhere classified: Principal | ICD-10-CM | POA: Diagnosis present

## 2021-12-30 DIAGNOSIS — K219 Gastro-esophageal reflux disease without esophagitis: Secondary | ICD-10-CM | POA: Diagnosis present

## 2021-12-30 DIAGNOSIS — Z20822 Contact with and (suspected) exposure to covid-19: Secondary | ICD-10-CM | POA: Diagnosis present

## 2021-12-30 DIAGNOSIS — Z800 Family history of malignant neoplasm of digestive organs: Secondary | ICD-10-CM

## 2021-12-30 DIAGNOSIS — Z8616 Personal history of COVID-19: Secondary | ICD-10-CM

## 2021-12-30 DIAGNOSIS — Z96653 Presence of artificial knee joint, bilateral: Secondary | ICD-10-CM | POA: Diagnosis present

## 2021-12-30 DIAGNOSIS — Z7901 Long term (current) use of anticoagulants: Secondary | ICD-10-CM

## 2021-12-30 DIAGNOSIS — I110 Hypertensive heart disease with heart failure: Secondary | ICD-10-CM | POA: Diagnosis not present

## 2021-12-30 DIAGNOSIS — F419 Anxiety disorder, unspecified: Secondary | ICD-10-CM

## 2021-12-30 DIAGNOSIS — Z981 Arthrodesis status: Secondary | ICD-10-CM

## 2021-12-30 DIAGNOSIS — S81802D Unspecified open wound, left lower leg, subsequent encounter: Secondary | ICD-10-CM | POA: Diagnosis not present

## 2021-12-30 DIAGNOSIS — Y92009 Unspecified place in unspecified non-institutional (private) residence as the place of occurrence of the external cause: Secondary | ICD-10-CM

## 2021-12-30 DIAGNOSIS — I509 Heart failure, unspecified: Secondary | ICD-10-CM | POA: Diagnosis not present

## 2021-12-30 DIAGNOSIS — X58XXXS Exposure to other specified factors, sequela: Secondary | ICD-10-CM | POA: Diagnosis present

## 2021-12-30 DIAGNOSIS — S81802A Unspecified open wound, left lower leg, initial encounter: Secondary | ICD-10-CM

## 2021-12-30 DIAGNOSIS — T79A22S Traumatic compartment syndrome of left lower extremity, sequela: Secondary | ICD-10-CM

## 2021-12-30 DIAGNOSIS — Z952 Presence of prosthetic heart valve: Secondary | ICD-10-CM

## 2021-12-30 DIAGNOSIS — I5022 Chronic systolic (congestive) heart failure: Secondary | ICD-10-CM | POA: Diagnosis present

## 2021-12-30 DIAGNOSIS — Z7982 Long term (current) use of aspirin: Secondary | ICD-10-CM

## 2021-12-30 DIAGNOSIS — Z79890 Hormone replacement therapy (postmenopausal): Secondary | ICD-10-CM

## 2021-12-30 DIAGNOSIS — Z8719 Personal history of other diseases of the digestive system: Secondary | ICD-10-CM

## 2021-12-30 DIAGNOSIS — R58 Hemorrhage, not elsewhere classified: Secondary | ICD-10-CM

## 2021-12-30 DIAGNOSIS — Z79899 Other long term (current) drug therapy: Secondary | ICD-10-CM

## 2021-12-30 DIAGNOSIS — Z8249 Family history of ischemic heart disease and other diseases of the circulatory system: Secondary | ICD-10-CM

## 2021-12-30 DIAGNOSIS — T45515A Adverse effect of anticoagulants, initial encounter: Secondary | ICD-10-CM | POA: Diagnosis present

## 2021-12-30 HISTORY — PX: APPLICATION OF WOUND VAC: SHX5189

## 2021-12-30 HISTORY — PX: I & D EXTREMITY: SHX5045

## 2021-12-30 LAB — CBC
HCT: 37 % — ABNORMAL LOW (ref 39.0–52.0)
Hemoglobin: 10.9 g/dL — ABNORMAL LOW (ref 13.0–17.0)
MCH: 25.1 pg — ABNORMAL LOW (ref 26.0–34.0)
MCHC: 29.5 g/dL — ABNORMAL LOW (ref 30.0–36.0)
MCV: 85.1 fL (ref 80.0–100.0)
Platelets: 308 10*3/uL (ref 150–400)
RBC: 4.35 MIL/uL (ref 4.22–5.81)
RDW: 15.8 % — ABNORMAL HIGH (ref 11.5–15.5)
WBC: 10.1 10*3/uL (ref 4.0–10.5)
nRBC: 0 % (ref 0.0–0.2)

## 2021-12-30 LAB — BASIC METABOLIC PANEL
Anion gap: 9 (ref 5–15)
BUN: 21 mg/dL (ref 8–23)
CO2: 26 mmol/L (ref 22–32)
Calcium: 8.9 mg/dL (ref 8.9–10.3)
Chloride: 103 mmol/L (ref 98–111)
Creatinine, Ser: 1.19 mg/dL (ref 0.61–1.24)
GFR, Estimated: >60 mL/min (ref >60)
Glucose, Bld: 80 mg/dL (ref 70–99)
Potassium: 4.2 mmol/L (ref 3.5–5.1)
Sodium: 138 mmol/L (ref 135–145)

## 2021-12-30 LAB — PROTIME-INR
INR: 1.2 (ref 0.8–1.2)
Prothrombin Time: 15.2 seconds (ref 11.4–15.2)

## 2021-12-30 SURGERY — IRRIGATION AND DEBRIDEMENT EXTREMITY
Anesthesia: General | Site: Leg Lower | Laterality: Left

## 2021-12-30 MED ORDER — 0.9 % SODIUM CHLORIDE (POUR BTL) OPTIME
TOPICAL | Status: DC | PRN
  Administered 2021-12-30: 1000 mL

## 2021-12-30 MED ORDER — MIDAZOLAM HCL 2 MG/2ML IJ SOLN
INTRAMUSCULAR | Status: DC | PRN
  Administered 2021-12-30: 2 mg via INTRAVENOUS

## 2021-12-30 MED ORDER — METOPROLOL TARTRATE 5 MG/5ML IV SOLN
2.0000 mg | Freq: Once | INTRAVENOUS | Status: AC
  Administered 2021-12-30: 2 mg via INTRAVENOUS

## 2021-12-30 MED ORDER — DOCUSATE SODIUM 100 MG PO CAPS
100.0000 mg | ORAL_CAPSULE | Freq: Two times a day (BID) | ORAL | Status: DC
  Administered 2021-12-30 – 2022-01-01 (×5): 100 mg via ORAL
  Filled 2021-12-30 (×5): qty 1

## 2021-12-30 MED ORDER — LACTATED RINGERS IV SOLN: INTRAVENOUS | Status: DC

## 2021-12-30 MED ORDER — LABETALOL HCL 5 MG/ML IV SOLN
5.0000 mg | Freq: Once | INTRAVENOUS | Status: AC
  Administered 2021-12-30: 5 mg via INTRAVENOUS

## 2021-12-30 MED ORDER — ORAL CARE MOUTH RINSE: 15.0000 mL | Freq: Once | OROMUCOSAL | Status: AC

## 2021-12-30 MED ORDER — SPIRONOLACTONE 25 MG PO TABS
25.0000 mg | ORAL_TABLET | Freq: Every day | ORAL | Status: DC
  Administered 2021-12-30 – 2022-01-05 (×7): 25 mg via ORAL
  Filled 2021-12-30 (×7): qty 1

## 2021-12-30 MED ORDER — HYDROMORPHONE HCL 1 MG/ML IJ SOLN
0.5000 mg | INTRAMUSCULAR | Status: DC | PRN
  Administered 2021-12-31 (×2): 0.5 mg via INTRAVENOUS
  Administered 2022-01-01 – 2022-01-02 (×4): 1 mg via INTRAVENOUS
  Filled 2021-12-30 (×6): qty 1

## 2021-12-30 MED ORDER — METOPROLOL TARTRATE 12.5 MG HALF TABLET
12.5000 mg | ORAL_TABLET | Freq: Two times a day (BID) | ORAL | Status: DC
  Administered 2021-12-30 – 2022-01-05 (×12): 12.5 mg via ORAL
  Filled 2021-12-30 (×12): qty 1

## 2021-12-30 MED ORDER — ACETAMINOPHEN 325 MG PO TABS: 325.0000 mg | ORAL_TABLET | Freq: Four times a day (QID) | ORAL | Status: DC | PRN

## 2021-12-30 MED ORDER — FENTANYL CITRATE (PF) 100 MCG/2ML IJ SOLN
25.0000 ug | INTRAMUSCULAR | Status: DC | PRN
  Administered 2021-12-30: 50 ug via INTRAVENOUS

## 2021-12-30 MED ORDER — FENTANYL CITRATE (PF) 250 MCG/5ML IJ SOLN
INTRAMUSCULAR | Status: AC
  Filled 2021-12-30: qty 5

## 2021-12-30 MED ORDER — ONDANSETRON HCL 4 MG/2ML IJ SOLN: 4.0000 mg | Freq: Four times a day (QID) | INTRAMUSCULAR | Status: DC | PRN

## 2021-12-30 MED ORDER — LIDOCAINE 2% (20 MG/ML) 5 ML SYRINGE
INTRAMUSCULAR | Status: DC | PRN
  Administered 2021-12-30: 60 mg via INTRAVENOUS

## 2021-12-30 MED ORDER — CEFAZOLIN SODIUM-DEXTROSE 2-4 GM/100ML-% IV SOLN
2.0000 g | INTRAVENOUS | Status: AC
  Administered 2021-12-30: 2 g via INTRAVENOUS
  Filled 2021-12-30: qty 100

## 2021-12-30 MED ORDER — ONDANSETRON HCL 4 MG/2ML IJ SOLN
INTRAMUSCULAR | Status: DC | PRN
  Administered 2021-12-30: 4 mg via INTRAVENOUS

## 2021-12-30 MED ORDER — PROPOFOL 10 MG/ML IV BOLUS
INTRAVENOUS | Status: DC | PRN
  Administered 2021-12-30: 200 mg via INTRAVENOUS

## 2021-12-30 MED ORDER — ONDANSETRON HCL 4 MG/2ML IJ SOLN: 4.0000 mg | Freq: Once | INTRAMUSCULAR | Status: DC | PRN

## 2021-12-30 MED ORDER — CEFAZOLIN SODIUM-DEXTROSE 2-4 GM/100ML-% IV SOLN
2.0000 g | Freq: Three times a day (TID) | INTRAVENOUS | Status: AC
  Administered 2021-12-30 – 2022-01-02 (×9): 2 g via INTRAVENOUS
  Filled 2021-12-30 (×9): qty 100

## 2021-12-30 MED ORDER — ONDANSETRON HCL 4 MG PO TABS: 4.0000 mg | ORAL_TABLET | Freq: Four times a day (QID) | ORAL | Status: DC | PRN

## 2021-12-30 MED ORDER — MIDAZOLAM HCL 2 MG/2ML IJ SOLN
INTRAMUSCULAR | Status: AC
  Filled 2021-12-30: qty 2

## 2021-12-30 MED ORDER — LOSARTAN POTASSIUM 50 MG PO TABS
50.0000 mg | ORAL_TABLET | Freq: Every day | ORAL | Status: DC
  Administered 2021-12-30 – 2022-01-05 (×7): 50 mg via ORAL
  Filled 2021-12-30 (×7): qty 1

## 2021-12-30 MED ORDER — OXYCODONE HCL 5 MG PO TABS
5.0000 mg | ORAL_TABLET | ORAL | Status: DC | PRN
  Administered 2022-01-04 (×2): 10 mg via ORAL
  Filled 2021-12-30 (×2): qty 2
  Filled 2021-12-30: qty 1
  Filled 2021-12-30 (×7): qty 2

## 2021-12-30 MED ORDER — DOCUSATE SODIUM 100 MG PO CAPS: 300.0000 mg | ORAL_CAPSULE | Freq: Every day | ORAL | Status: DC

## 2021-12-30 MED ORDER — DEXAMETHASONE SODIUM PHOSPHATE 10 MG/ML IJ SOLN
INTRAMUSCULAR | Status: AC
  Filled 2021-12-30: qty 1

## 2021-12-30 MED ORDER — OXYCODONE HCL 5 MG/5ML PO SOLN: 5.0000 mg | Freq: Once | ORAL | Status: DC | PRN

## 2021-12-30 MED ORDER — FENTANYL CITRATE (PF) 250 MCG/5ML IJ SOLN
INTRAMUSCULAR | Status: DC | PRN
  Administered 2021-12-30 (×2): 50 ug via INTRAVENOUS
  Administered 2021-12-30: 100 ug via INTRAVENOUS
  Administered 2021-12-30: 50 ug via INTRAVENOUS

## 2021-12-30 MED ORDER — LABETALOL HCL 5 MG/ML IV SOLN
INTRAVENOUS | Status: AC
Start: 2021-12-30 — End: 2021-12-31
  Filled 2021-12-30: qty 4

## 2021-12-30 MED ORDER — CHLORHEXIDINE GLUCONATE 0.12 % MT SOLN
15.0000 mL | Freq: Once | OROMUCOSAL | Status: AC
  Administered 2021-12-30: 15 mL via OROMUCOSAL
  Filled 2021-12-30: qty 15

## 2021-12-30 MED ORDER — DIPHENHYDRAMINE HCL 12.5 MG/5ML PO ELIX
12.5000 mg | ORAL_SOLUTION | ORAL | Status: DC | PRN
  Administered 2021-12-30 – 2022-01-05 (×8): 25 mg via ORAL
  Filled 2021-12-30 (×8): qty 10

## 2021-12-30 MED ORDER — ADULT MULTIVITAMIN W/MINERALS CH
1.0000 | ORAL_TABLET | Freq: Every day | ORAL | Status: DC
  Administered 2021-12-30 – 2022-01-05 (×6): 1 via ORAL
  Filled 2021-12-30 (×6): qty 1

## 2021-12-30 MED ORDER — DEXAMETHASONE SODIUM PHOSPHATE 10 MG/ML IJ SOLN
INTRAMUSCULAR | Status: DC | PRN
  Administered 2021-12-30: 10 mg via INTRAVENOUS

## 2021-12-30 MED ORDER — FENTANYL CITRATE (PF) 100 MCG/2ML IJ SOLN
INTRAMUSCULAR | Status: AC
  Filled 2021-12-30: qty 2

## 2021-12-30 MED ORDER — METOPROLOL TARTRATE 5 MG/5ML IV SOLN
INTRAVENOUS | Status: AC
  Filled 2021-12-30: qty 5

## 2021-12-30 MED ORDER — ASPIRIN EC 81 MG PO TBEC
81.0000 mg | DELAYED_RELEASE_TABLET | Freq: Every day | ORAL | Status: DC
  Administered 2021-12-30 – 2022-01-05 (×6): 81 mg via ORAL
  Filled 2021-12-30 (×6): qty 1

## 2021-12-30 MED ORDER — METHOCARBAMOL 1000 MG/10ML IJ SOLN
500.0000 mg | Freq: Four times a day (QID) | INTRAVENOUS | Status: DC | PRN
  Filled 2021-12-30 (×3): qty 5

## 2021-12-30 MED ORDER — MELATONIN 5 MG PO TABS
10.0000 mg | ORAL_TABLET | Freq: Every day | ORAL | Status: DC
  Administered 2021-12-30 – 2022-01-04 (×6): 10 mg via ORAL
  Filled 2021-12-30 (×6): qty 2

## 2021-12-30 MED ORDER — METOCLOPRAMIDE HCL 5 MG/ML IJ SOLN: 5.0000 mg | Freq: Three times a day (TID) | INTRAMUSCULAR | Status: DC | PRN

## 2021-12-30 MED ORDER — WARFARIN - PHARMACIST DOSING INPATIENT: Freq: Every day | Status: DC

## 2021-12-30 MED ORDER — WARFARIN SODIUM 7.5 MG PO TABS
7.5000 mg | ORAL_TABLET | Freq: Once | ORAL | Status: AC
  Administered 2021-12-30: 7.5 mg via ORAL
  Filled 2021-12-30: qty 1

## 2021-12-30 MED ORDER — ALPRAZOLAM 0.5 MG PO TABS
0.5000 mg | ORAL_TABLET | Freq: Two times a day (BID) | ORAL | Status: DC
Start: 2021-12-30 — End: 2022-01-05
  Administered 2021-12-30 – 2022-01-05 (×12): 0.5 mg via ORAL
  Filled 2021-12-30 (×12): qty 1

## 2021-12-30 MED ORDER — POTASSIUM CHLORIDE CRYS ER 10 MEQ PO TBCR
10.0000 meq | EXTENDED_RELEASE_TABLET | Freq: Two times a day (BID) | ORAL | Status: DC
  Administered 2021-12-30 – 2022-01-05 (×11): 10 meq via ORAL
  Filled 2021-12-30 (×11): qty 1

## 2021-12-30 MED ORDER — SODIUM CHLORIDE 0.9 % IV SOLN: INTRAVENOUS | Status: DC

## 2021-12-30 MED ORDER — TORSEMIDE 20 MG PO TABS
20.0000 mg | ORAL_TABLET | Freq: Every day | ORAL | Status: DC
  Administered 2021-12-31 – 2022-01-05 (×5): 20 mg via ORAL
  Filled 2021-12-30 (×6): qty 1

## 2021-12-30 MED ORDER — METOCLOPRAMIDE HCL 5 MG PO TABS: 5.0000 mg | ORAL_TABLET | Freq: Three times a day (TID) | ORAL | Status: DC | PRN

## 2021-12-30 MED ORDER — PROPOFOL 10 MG/ML IV BOLUS
INTRAVENOUS | Status: AC
  Filled 2021-12-30: qty 20

## 2021-12-30 MED ORDER — OXYCODONE HCL 5 MG PO TABS
10.0000 mg | ORAL_TABLET | ORAL | Status: DC | PRN
  Administered 2021-12-31: 15:00:00 10 mg via ORAL
  Administered 2021-12-31: 15 mg via ORAL
  Administered 2021-12-31 – 2022-01-01 (×4): 10 mg via ORAL
  Administered 2022-01-01: 15 mg via ORAL
  Administered 2022-01-01: 10:00:00 10 mg via ORAL
  Administered 2022-01-02: 15 mg via ORAL
  Administered 2022-01-02: 10 mg via ORAL
  Administered 2022-01-02 – 2022-01-03 (×6): 15 mg via ORAL
  Administered 2022-01-04 – 2022-01-05 (×6): 10 mg via ORAL
  Filled 2021-12-30 (×4): qty 3
  Filled 2021-12-30: qty 2
  Filled 2021-12-30: qty 3
  Filled 2021-12-30: qty 2
  Filled 2021-12-30 (×3): qty 3
  Filled 2021-12-30: qty 2
  Filled 2021-12-30 (×2): qty 3

## 2021-12-30 MED ORDER — METHOCARBAMOL 500 MG PO TABS
500.0000 mg | ORAL_TABLET | Freq: Four times a day (QID) | ORAL | Status: DC | PRN
  Administered 2021-12-31 – 2022-01-01 (×2): 500 mg via ORAL
  Filled 2021-12-30 (×2): qty 1

## 2021-12-30 MED ORDER — OXYCODONE HCL 5 MG PO TABS: 5.0000 mg | ORAL_TABLET | Freq: Once | ORAL | Status: DC | PRN

## 2021-12-30 MED ORDER — ONDANSETRON HCL 4 MG/2ML IJ SOLN
INTRAMUSCULAR | Status: AC
  Filled 2021-12-30: qty 2

## 2021-12-30 MED ORDER — ATORVASTATIN CALCIUM 10 MG PO TABS
20.0000 mg | ORAL_TABLET | Freq: Every day | ORAL | Status: DC
  Administered 2021-12-30 – 2022-01-05 (×6): 20 mg via ORAL
  Filled 2021-12-30 (×6): qty 2

## 2021-12-30 SURGICAL SUPPLY — 51 items
BAG COUNTER SPONGE SURGICOUNT (BAG) ×2 IMPLANT
BLADE SURG 10 STRL SS (BLADE) ×2 IMPLANT
BNDG COHESIVE 4X5 TAN STRL (GAUZE/BANDAGES/DRESSINGS) ×2 IMPLANT
BNDG COHESIVE 6X5 TAN ST LF (GAUZE/BANDAGES/DRESSINGS) ×1 IMPLANT
BNDG COHESIVE 6X5 TAN STRL LF (GAUZE/BANDAGES/DRESSINGS) ×4 IMPLANT
BNDG ELASTIC 3X5.8 VLCR STR LF (GAUZE/BANDAGES/DRESSINGS) IMPLANT
CANISTER WOUND CARE 500ML ATS (WOUND CARE) ×1 IMPLANT
COVER SURGICAL LIGHT HANDLE (MISCELLANEOUS) ×2 IMPLANT
CUFF TOURN SGL QUICK 18X4 (TOURNIQUET CUFF) ×1 IMPLANT
CUFF TOURN SGL QUICK 34 (TOURNIQUET CUFF)
CUFF TRNQT CYL 34X4.125X (TOURNIQUET CUFF) IMPLANT
DRAPE INCISE IOBAN 66X45 STRL (DRAPES) ×1 IMPLANT
DRAPE ORTHO SPLIT 77X108 STRL (DRAPES) ×4
DRAPE SURG 17X23 STRL (DRAPES) IMPLANT
DRAPE SURG ORHT 6 SPLT 77X108 (DRAPES) ×2 IMPLANT
DRAPE U-SHAPE 47X51 STRL (DRAPES) ×2 IMPLANT
DRESSING VERAFLO CLEANS CC MED (GAUZE/BANDAGES/DRESSINGS) IMPLANT
DRSG VERAFLO CLEANSE CC MED (GAUZE/BANDAGES/DRESSINGS) ×6
DURAPREP 26ML APPLICATOR (WOUND CARE) ×2 IMPLANT
ELECT REM PT RETURN 9FT ADLT (ELECTROSURGICAL)
ELECTRODE REM PT RTRN 9FT ADLT (ELECTROSURGICAL) IMPLANT
GAUZE SPONGE 4X4 12PLY STRL (GAUZE/BANDAGES/DRESSINGS) ×2 IMPLANT
GAUZE XEROFORM 1X8 LF (GAUZE/BANDAGES/DRESSINGS) ×2 IMPLANT
GLOVE SRG 8 PF TXTR STRL LF DI (GLOVE) ×2 IMPLANT
GLOVE SURG ENC MOIS LTX SZ8 (GLOVE) ×2 IMPLANT
GLOVE SURG ORTHO LTX SZ7.5 (GLOVE) ×2 IMPLANT
GLOVE SURG UNDER POLY LF SZ8 (GLOVE) ×4
GOWN STRL REUS W/ TWL LRG LVL3 (GOWN DISPOSABLE) ×1 IMPLANT
GOWN STRL REUS W/ TWL XL LVL3 (GOWN DISPOSABLE) ×2 IMPLANT
GOWN STRL REUS W/TWL LRG LVL3 (GOWN DISPOSABLE) ×2
GOWN STRL REUS W/TWL XL LVL3 (GOWN DISPOSABLE) ×4
HANDPIECE INTERPULSE COAX TIP (DISPOSABLE)
KIT BASIN OR (CUSTOM PROCEDURE TRAY) ×2 IMPLANT
KIT TURNOVER KIT B (KITS) ×2 IMPLANT
MANIFOLD NEPTUNE II (INSTRUMENTS) ×2 IMPLANT
NS IRRIG 1000ML POUR BTL (IV SOLUTION) ×2 IMPLANT
PACK ORTHO EXTREMITY (CUSTOM PROCEDURE TRAY) ×2 IMPLANT
PAD ARMBOARD 7.5X6 YLW CONV (MISCELLANEOUS) ×4 IMPLANT
PAD NEG PRESSURE SENSATRAC (MISCELLANEOUS) ×1 IMPLANT
PADDING CAST ABS 4INX4YD NS (CAST SUPPLIES) ×2
PADDING CAST ABS COTTON 4X4 ST (CAST SUPPLIES) ×2 IMPLANT
PADDING CAST COTTON 6X4 STRL (CAST SUPPLIES) ×2 IMPLANT
SET HNDPC FAN SPRY TIP SCT (DISPOSABLE) IMPLANT
SPONGE T-LAP 18X18 ~~LOC~~+RFID (SPONGE) ×2 IMPLANT
STOCKINETTE IMPERVIOUS 9X36 MD (GAUZE/BANDAGES/DRESSINGS) ×2 IMPLANT
SWAB CULTURE ESWAB REG 1ML (MISCELLANEOUS) IMPLANT
TOWEL GREEN STERILE (TOWEL DISPOSABLE) ×2 IMPLANT
TOWEL GREEN STERILE FF (TOWEL DISPOSABLE) ×2 IMPLANT
TUBE CONNECTING 12X1/4 (SUCTIONS) ×2 IMPLANT
UNDERPAD 30X36 HEAVY ABSORB (UNDERPADS AND DIAPERS) ×2 IMPLANT
YANKAUER SUCT BULB TIP NO VENT (SUCTIONS) ×2 IMPLANT

## 2021-12-30 NOTE — Brief Op Note (Signed)
12/30/2021  6:02 PM  PATIENT:  Shary Decamp  66 y.o. male  PRE-OPERATIVE DIAGNOSIS:  chronic left leg wound  POST-OPERATIVE DIAGNOSIS:  chronic left leg wound  PROCEDURE:  Procedure(s): IRRIGATION AND DEBRIDEMENT LEFT LOWER LEG WOUND (Left) APPLICATION OF WOUND VAC (Left)  SURGEON:  Surgeon(s) and Role:    Mcarthur Rossetti, MD - Primary  PHYSICIAN ASSISTANT: Benita Stabile, PA-C  ANESTHESIA:   general COUNTS:  YES  PLAN OF CARE: Admit to inpatient   PATIENT DISPOSITION:  PACU - hemodynamically stable.   Delay start of Pharmacological VTE agent (>24hrs) due to surgical blood loss or risk of bleeding: no

## 2021-12-30 NOTE — Progress Notes (Signed)
ANTICOAGULATION CONSULT NOTE - Initial Consult  Pharmacy Consult for Warfarin Indication: atrial fibrillation  No Known Allergies  Patient Measurements: Height: 6\' 3"  (190.5 cm) Weight: 106.1 kg (234 lb) IBW/kg (Calculated) : 84.5  Vital Signs: Temp: 97.9 F (36.6 C) (02/28 1334) Temp Source: Oral (02/28 1334) BP: 154/116 (02/28 1334) Pulse Rate: 120 (02/28 1334)  Labs: Recent Labs    12/30/21 1349  HGB 10.9*  HCT 37.0*  PLT 308  LABPROT 15.2  INR 1.2  CREATININE 1.19    Estimated Creatinine Clearance: 80.4 mL/min (by C-G formula based on SCr of 1.19 mg/dL).   Medical History: Past Medical History:  Diagnosis Date   Acute on chronic systolic (congestive) heart failure ([place]) 12/30/2018   Allergy    Arthritis    Atrial flutter with rapid ventricular response (H[facility])    Chronic systolic (congestive) heart failure ([place])    COVID 10/2021   mild   Dilated aortic root ([place]) 01/04/2019   Dilated cardiomyopathy ([place]) 01/04/2019   GERD (gastroesophageal reflux disease)    history of   Heart murmur    History of colon polyps    Hypertension    Insomnia    Mitral valve prolapse    Mitral valve regurgitation    Persistent atrial fibrillation ([place]) 12/02/2018   S/P Maze operation for atrial fibrillation 03/21/2019   Complete bilateral atrial lesion set using cryothermy and bipolar radiofrequency ablation with clipping of LA appendage via right mini thoracotomy approach   S/P minimally invasive mitral valve repair 03/21/2019   Complex valvuloplasty including triangular resection of posterior leaflet, artificial Gore-tex neochord placement x4 and 36 mm Sorin Memo 4D ring annuloplasty via right mini thoracotomy approach   Seizures ([place])    had one approx. 30 yrs ago,has not had any since   Severe mitral regurgitation     Medications:  Medications Prior to Admission  Medication Sig Dispense Refill Last Dose   ALPRAZolam (XANAX) 0.5 MG tablet Take 0.5 mg by mouth 2 (two)  times daily.   12/30/2021 at 0900   aspirin EC 81 MG EC tablet Take 1 tablet (81 mg total) by mouth daily.   12/29/2021   atorvastatin (LIPITOR) 20 MG tablet Take 20 mg by mouth daily.   12/29/2021   docusate sodium (COLACE) 100 MG capsule Take 300 mg by mouth daily.   12/29/2021   HYDROcodone-acetaminophen (NORCO) 10-325 MG tablet Take 1 tablet by mouth 2 (two) times daily as needed for severe pain.   12/30/2021 at 0900   losartan (COZAAR) 50 MG tablet Take 1 tablet by mouth once daily 90 tablet 0    Melatonin 10 MG TABS Take 10 mg by mouth at bedtime.   Past Week   metoprolol tartrate (LOPRESSOR) 25 MG tablet Take 1/2 (one-half) tablet by mouth twice daily (Patient taking differently: Take 12.5 mg by mouth 2 (two) times daily.) 90 tablet 3 12/30/2021 at 0700   Multiple Vitamin (MULTIVITAMIN WITH MINERALS) TABS tablet Take 1 tablet by mouth daily.   12/29/2021   Multiple Vitamins-Minerals (MULTIVITAMIN WITH MINERALS) tablet Take 1 tablet by mouth daily.   12/29/2021   potassium chloride (KLOR-CON M) 10 MEQ tablet Take 1 tablet (10 mEq total) by mouth 2 (two) times daily. **Patient need a appointment for future refill** 180 tablet 0 12/29/2021   spironolactone (ALDACTONE) 25 MG tablet Take 1 tablet by mouth once daily 60 tablet 0 12/29/2021   torsemide (DEMADEX) 20 MG tablet Take 1 tablet (20 mg total) by mouth daily.  90 tablet 3 12/29/2021   warfarin (COUMADIN) 5 MG tablet Take 1-1.5 tablets (5-7.5 mg total) by mouth See admin instructions. Takes 5 mg daily except Wednesday. Takes 7.5 mg once daily on Wednesday 90 tablet 0 12/25/2021   methocarbamol (ROBAXIN) 500 MG tablet Take 1 tablet (500 mg total) by mouth 3 (three) times daily. (Patient not taking: Reported on 12/25/2021) 40 tablet 1 Not Taking   oxyCODONE (OXY IR/ROXICODONE) 5 MG immediate release tablet Take 1-2 tablets (5-10 mg total) by mouth every 6 (six) hours as needed for moderate pain (pain score 4-6). 30 tablet 0    pregabalin (LYRICA) 75 MG  capsule Take 1 capsule (75 mg total) by mouth 2 (two) times daily. (Patient not taking: Reported on 12/25/2021) 60 capsule 2 Not Taking   tiZANidine (ZANAFLEX) 4 MG tablet Take 1 tablet (4 mg total) by mouth every 8 (eight) hours as needed for muscle spasms. (Patient not taking: Reported on 12/25/2021) 60 tablet 0 Not Taking    Assessment: 66 y.o. M s/p I&D of chronic L leg wound with wound vac applied. Pt on warfarin PTA for afib. Admission INR 1.2. CBC ok on admission. Home dose: 5mg  daily except for 7.5mg  on Wed (last dose PTA 2/23)  Goal of Therapy:  INR 2-3 Monitor platelets by anticoagulation protocol: Yes   Plan:  Daily INR Warfarin 7.5mg  tonight  Sherlon Handing, PharmD, BCPS Please see amion for complete clinical pharmacist phone list 12/30/2021,6:09 PM

## 2021-12-30 NOTE — Progress Notes (Signed)
Pt stated he tested positive for COVID with an at home test in mid December.  No need to retest today, per protocol.

## 2021-12-30 NOTE — Op Note (Signed)
[name], [name] MEDICAL RECORD NO: [id number] ACCOUNT NO: [id number] DATE OF BIRTH: 06/28/1956 FACILITY: [facility] LOCATION: [facility] PHYSICIAN: Lind Guest. Ninfa Linden, MD  Operative Report   DATE OF PROCEDURE: 12/30/2021  PREOPERATIVE DIAGNOSIS:  Chronic left lower extremity wound.  POSTOPERATIVE DIAGNOSIS:  Chronic left lower extremity wound.  PROCEDURE:   1.  Excisional debridement of left lower extremity wounds. 2.  VAC sponge placement over left lower extremity wound.  SURGEON:  Lind Guest. Ninfa Linden, MD  ASSISTANT:  [name], PA-C  ANESTHESIA:  General via LMA.  ANTIBIOTICS:  2 g IV Ancef.  ESTIMATED BLOOD LOSS:  Minimal.  COMPLICATIONS:  None.  INDICATIONS:  The patient is a 66 year old gentleman well known to me.  He sustained a significant contusion last November to his left lower extremity.  He is on blood thinning medications and developed compartment syndrome.  He was taken emergently to  the operating room for a 4-compartment fasciotomies.  He was then returned later for a second I and D of the wound and eventually had to have skin grafting with a synthetic allograft skin graft.  We have been treating with local wound care for the last  several months. He developed significant granulation tissue and a wider area of some necrosis, but just really significant hypertrophic granulation tissue.  At this point, he has maximized his healing and is in need of a repeat debridement of the wound  to get a good bleeding base with a VAC sponge placement and likely placing some type of other synthetic graft eventually over the wound.  I explained this to him in detail and he does wish to proceed.  DESCRIPTION OF PROCEDURE:  After informed consent was obtained and appropriate left leg was marked, he was brought to the operating room and placed up on the operating table.  General anesthesia was then obtained.  We then prepped his left leg with  Betadine scrub and paint.  Using  a #10 blade, we performed a sharp excisional debridement of just some necrotic skin and fascia.  We did not have to debride anything deeply.  He has a lot of dependent edema in that leg, but there was no evidence of  infection either.  We then chose the Cleanse Choice VAC to place over the wound and placed a covering over this to obtain a good seal with the VAC setting at -125 mm of pressure.  We then wrapped his leg and Coban.  after this for a compressive dressing  due to his dependent edema.  He was awakened, extubated, and taken to recovery room in stable condition with all final counts being correct.  No complications noted.  Postoperatively, we are going to have him strict elevation of that leg with letting the  [facility] help the soft tissue material for the next 3 days.  I have consulted my partner, Dr. Meridee Score, who will assume care for potential synthetic grafting this coming Friday of the wound.      PAA D: 12/30/2021 6:01:18 pm T: 12/30/2021 10:15:00 pm  JOB: [id number]/ [id number]

## 2021-12-30 NOTE — Anesthesia Preprocedure Evaluation (Addendum)
Anesthesia Evaluation  Patient identified by MRN, date of birth, ID band Patient awake    Reviewed: Allergy & Precautions, H&P , NPO status , Patient's Chart, lab work & pertinent test results, reviewed documented beta blocker date and time   History of Anesthesia Complications Negative for: history of anesthetic complications  Airway Mallampati: II  TM Distance: >3 FB Neck ROM: Full    Dental  (+) Dental Advisory Given, Chipped,    Pulmonary neg pulmonary ROS,    Pulmonary exam normal breath sounds clear to auscultation       Cardiovascular hypertension, Pt. on medications and Pt. on home beta blockers +CHF  negative cardio ROS Normal cardiovascular exam+ Valvular Problems/Murmurs MR  Rate:Normal  S/P MVR TEE 1. Left ventricular ejection fraction, by estimation, is 45 to 50%. The left ventricle has mildly decreased function. The left ventricle has no regional wall motion abnormalities. There is mild concentric left ventricular hypertrophy. Left ventricular  diastolic function could not be evaluated. 2. Right ventricular systolic function is mildly reduced. The right ventricular size is normal. Tricuspid regurgitation signal is inadequate for assessing PA pressure. 3. Left atrial size was moderately dilated. 4. Right atrial size was moderately dilated. 5. The aortic valve is tricuspid. Aortic valve regurgitation is not visualized. No aortic stenosis is present.  Comparison(s): Prior images reviewed side by side. Changes from prior study are noted. The left ventricular function is worsened. The rhythm is now atrial fibrillation with rapid ventricular response and the transmitral gradient has increased.   Neuro/Psych Seizures -, Well Controlled,  Anxiety  Neuromuscular disease negative psych ROS   GI/Hepatic Neg liver ROS, GERD  Medicated and Controlled,  Endo/Other  negative endocrine ROS  Renal/GU negative Renal ROS   negative genitourinary   Musculoskeletal negative musculoskeletal ROS (+) Arthritis , Osteoarthritis,  Chronic left leg wound   Abdominal   Peds negative pediatric ROS (+)  Hematology negative hematology ROS (+) Coumadin therapy- last dose 2/23   Anesthesia Other Findings   Reproductive/Obstetrics negative OB ROS                            Anesthesia Physical  Anesthesia Plan  ASA: 3  Anesthesia Plan: General   Post-op Pain Management:    Induction: Intravenous, Rapid sequence and Cricoid pressure planned  PONV Risk Score and Plan: 3 and Ondansetron, Dexamethasone, Midazolam and Treatment may vary due to age or medical condition  Airway Management Planned: LMA  Additional Equipment:   Intra-op Plan:   Post-operative Plan: Extubation in OR  Informed Consent: I have reviewed the patients History and Physical, chart, labs and discussed the procedure including the risks, benefits and alternatives for the proposed anesthesia with the patient or authorized representative who has indicated his/her understanding and acceptance.     Dental advisory given  Plan Discussed with: CRNA and Anesthesiologist  Anesthesia Plan Comments:         Anesthesia Quick Evaluation

## 2021-12-30 NOTE — H&P (Signed)
[name] is an 66 y.o. male.   Chief Complaint:  left leg wound HPI:   The patient is a 66 year old gentleman well-known to me.  Several months ago [name] sustained a significant contusion to his left leg and developed compartment syndrome.  [name] was on blood thinning medication.  [name] was taken to the operating room for 4 compartment fasciotomy and then subsequent I&D.  We were able to close the medial wound but could not close the lateral wound.  [name] underwent skin grafting using synthetic skin grafting.  We have then had him through several months of wound care but cannot get the leg wound to heal.  [name] has developed significant dependent edema on that side.  At this point we recommended a repeat irrigation debridement and VAC placement to the wound with hopes of trying a different skin graft after several days of getting the wound to improve with VAC placement following an irrigation and debridement today.  Past Medical History:  Diagnosis Date   Acute on chronic systolic (congestive) heart failure (HCC) 12/30/2018   Allergy    Arthritis    Atrial flutter with rapid ventricular response (HCC)    Chronic systolic (congestive) heart failure ([place])    COVID 10/2021   mild   Dilated aortic root ([place]) 01/04/2019   Dilated cardiomyopathy ([place]) 01/04/2019   GERD (gastroesophageal reflux disease)    history of   Heart murmur    History of colon polyps    Hypertension    Insomnia    Mitral valve prolapse    Mitral valve regurgitation    Persistent atrial fibrillation ([place]) 12/02/2018   S/P Maze operation for atrial fibrillation 03/21/2019   Complete bilateral atrial lesion set using cryothermy and bipolar radiofrequency ablation with clipping of LA appendage via right mini thoracotomy approach   S/P minimally invasive mitral valve repair 03/21/2019   Complex valvuloplasty including triangular resection of posterior leaflet, artificial Gore-tex neochord placement x4 and 36 mm Sorin Memo 4D ring  annuloplasty via right mini thoracotomy approach   Seizures ([place])    had one approx. 30 yrs ago,has not had any since   Severe mitral regurgitation     Past Surgical History:  Procedure Laterality Date   ANKLE FUSION  left   Dec. 2725   APPLICATION OF WOUND VAC Left 09/22/2021   Procedure: APPLICATION OF WOUND VAC;  Surgeon: Mcarthur Rossetti, MD;  Location: [place];  Service: Orthopedics;  Laterality: Left;   ARTHROSCOPY KNEE W/ DRILLING  bilateral   2012   CARDIAC VALVE REPLACEMENT     CARDIOVERSION N/A 01/03/2019   Procedure: CARDIOVERSION;  Surgeon: Sueanne Margarita, MD;  Location: [place];  Service: Cardiovascular;  Laterality: N/A;   CARDIOVERSION N/A 06/23/2019   Procedure: CARDIOVERSION;  Surgeon: Fay Records, MD;  Location: [place];  Service: Cardiovascular;  Laterality: N/A;   CLIPPING OF ATRIAL APPENDAGE  03/21/2019   Procedure: Clipping Of Atrial Appendage using 30mm Atricure Pro2 Clip;  Surgeon: Rexene Alberts, MD;  Location: [place];  Service: Open Heart Surgery;;   COLONOSCOPY     x2   FOOT ARTHRODESIS  06/23/2012   Procedure: ARTHRODESIS FOOT;  Surgeon: Wylene Simmer, MD;  Location: [place];  Service: Orthopedics;  Laterality: Left;  Left Subtalar and Talonavicular Joint Revision Arthrodesis  Aspiration of Bone Marrow from Left Hip    HAIR TRANSPLANT     HARDWARE REMOVAL  06/23/2012   Procedure: HARDWARE REMOVAL;  Surgeon: Wylene Simmer, MD;  Location: [place];  Service: Orthopedics;  Laterality: Left;  Removal of Deep Implant  X's 3   I & D EXTREMITY Left 09/23/2021   Procedure: IRRIGATION AND DEBRIDEMENT OF LEG;  Surgeon: Mcarthur Rossetti, MD;  Location: [place];  Service: Orthopedics;  Laterality: Left;   I & D EXTREMITY Left 09/26/2021   Procedure: REPEAT IRRIGATION AND DEBRIDEMENT LEFT LEG, POSSIBLE WOUND CLOSURE, POSSIBLE VAC CHANGE, POSSIBLE SKIN GRAFT;  Surgeon: Mcarthur Rossetti, MD;  Location: [place];  Service: Orthopedics;  Laterality: Left;    INCISION AND DRAINAGE WOUND WITH FASCIOTOMY Left 09/22/2021   Procedure: INCISION AND DRAINAGE WOUND WITH FASCIOTOMY;  Surgeon: Mcarthur Rossetti, MD;  Location: [place];  Service: Orthopedics;  Laterality: Left;   INSERTION OF MESH N/A 07/10/2016   Procedure: INSERTION OF MESH;  Surgeon: Coralie Keens, MD;  Location: [place];  Service: General;  Laterality: N/A;   JOINT REPLACEMENT     right hip  01-2011   LEFT HEART CATH AND CORONARY ANGIOGRAPHY N/A 03/16/2019   Procedure: LEFT HEART CATH AND CORONARY ANGIOGRAPHY;  Surgeon: Sherren Mocha, MD;  Location: [place] [facility];  Service: Cardiovascular;  Laterality: N/A;   LIMB SPARING RESECTION HIP W/ SADDLE JOINT REPLACEMENT Right    MINIMALLY INVASIVE MAZE PROCEDURE N/A 03/21/2019   Procedure: MINIMALLY INVASIVE MAZE PROCEDURE;  Surgeon: Rexene Alberts, MD;  Location: [place];  Service: Open Heart Surgery;  Laterality: N/A;   MITRAL VALVE REPAIR Right 03/21/2019   Procedure: MINIMALLY INVASIVE MITRAL VALVE REPAIR (MVR) using Memo 4D ring size 36;  Surgeon: Rexene Alberts, MD;  Location: [place];  Service: Open Heart Surgery;  Laterality: Right;   TEE WITHOUT CARDIOVERSION N/A 01/03/2019   Procedure: TRANSESOPHAGEAL ECHOCARDIOGRAM (TEE);  Surgeon: Sueanne Margarita, MD;  Location: [facility];  Service: Cardiovascular;  Laterality: N/A;   TEE WITHOUT CARDIOVERSION N/A 03/21/2019   Procedure: TRANSESOPHAGEAL ECHOCARDIOGRAM (TEE);  Surgeon: Rexene Alberts, MD;  Location: [place];  Service: Open Heart Surgery;  Laterality: N/A;   TEMPORARY PACEMAKER N/A 03/22/2019   Procedure: TEMPORARY PACEMAKER;  Surgeon: Belva Crome, MD;  Location: [place] [facility];  Service: Cardiovascular;  Laterality: N/A;   TOTAL KNEE ARTHROPLASTY Right 01/19/2014   Procedure: RIGHT TOTAL KNEE ARTHROPLASTY, Steroid injection left knee;  Surgeon: Mcarthur Rossetti, MD;  Location: [facility];  Service: Orthopedics;  Laterality: Right;   TOTAL KNEE  ARTHROPLASTY Left 10/23/2014   Procedure: LEFT TOTAL KNEE ARTHROPLASTY;  Surgeon: Mcarthur Rossetti, MD;  Location: [facility];  Service: Orthopedics;  Laterality: Left;   UMBILICAL HERNIA REPAIR N/A 07/10/2016   Procedure: UMBILICAL HERNIA REPAIR;  Surgeon: Coralie Keens, MD;  Location: [place];  Service: General;  Laterality: N/A;    Family History  Problem Relation Age of Onset   Hypertension Father    Colon cancer Father        dx in his late 41's   Diabetes Mother    Stomach cancer Neg Hx    Esophageal cancer Neg Hx    Pancreatic cancer Neg Hx    Rectal cancer Neg Hx    Social History:  reports that [name] has never smoked. [name] has never used smokeless tobacco. [name] reports current alcohol use of about 2.0 - 4.0 standard drinks per week. [name] reports that [name] does not use drugs.  Allergies: No Known Allergies  Medications Prior to Admission  Medication Sig Dispense Refill   ALPRAZolam (XANAX) 0.5 MG  tablet Take 0.5 mg by mouth 2 (two) times daily.     aspirin EC 81 MG EC tablet Take 1 tablet (81 mg total) by mouth daily.     atorvastatin (LIPITOR) 20 MG tablet Take 20 mg by mouth daily.     docusate sodium (COLACE) 100 MG capsule Take 300 mg by mouth daily.     HYDROcodone-acetaminophen (NORCO) 10-325 MG tablet Take 1 tablet by mouth 2 (two) times daily as needed for severe pain.     losartan (COZAAR) 50 MG tablet Take 1 tablet by mouth once daily 90 tablet 0   Melatonin 10 MG TABS Take 10 mg by mouth at bedtime.     metoprolol tartrate (LOPRESSOR) 25 MG tablet Take 1/2 (one-half) tablet by mouth twice daily (Patient taking differently: Take 12.5 mg by mouth 2 (two) times daily.) 90 tablet 3   Multiple Vitamin (MULTIVITAMIN WITH MINERALS) TABS tablet Take 1 tablet by mouth daily.     Multiple Vitamins-Minerals (MULTIVITAMIN WITH MINERALS) tablet Take 1 tablet by mouth daily.     potassium chloride (KLOR-CON M) 10 MEQ tablet Take 1 tablet (10 mEq total) by mouth 2 (two)  times daily. **Patient need a appointment for future refill** 180 tablet 0   spironolactone (ALDACTONE) 25 MG tablet Take 1 tablet by mouth once daily 60 tablet 0   torsemide (DEMADEX) 20 MG tablet Take 1 tablet (20 mg total) by mouth daily. 90 tablet 3   warfarin (COUMADIN) 5 MG tablet Take 1-1.5 tablets (5-7.5 mg total) by mouth See admin instructions. Takes 5 mg daily except Wednesday. Takes 7.5 mg once daily on Wednesday 90 tablet 0   methocarbamol (ROBAXIN) 500 MG tablet Take 1 tablet (500 mg total) by mouth 3 (three) times daily. (Patient not taking: Reported on 12/25/2021) 40 tablet 1   oxyCODONE (OXY IR/ROXICODONE) 5 MG immediate release tablet Take 1-2 tablets (5-10 mg total) by mouth every 6 (six) hours as needed for moderate pain (pain score 4-6). 30 tablet 0   pregabalin (LYRICA) 75 MG capsule Take 1 capsule (75 mg total) by mouth 2 (two) times daily. (Patient not taking: Reported on 12/25/2021) 60 capsule 2   tiZANidine (ZANAFLEX) 4 MG tablet Take 1 tablet (4 mg total) by mouth every 8 (eight) hours as needed for muscle spasms. (Patient not taking: Reported on 12/25/2021) 60 tablet 0    Results for orders placed or performed during the hospital encounter of 12/30/21 (from the past 48 hour(s))  CBC per protocol     Status: Abnormal   Collection Time: 12/30/21  1:49 PM  Result Value Ref Range   WBC 10.1 4.0 - 10.5 K/uL   RBC 4.35 4.22 - 5.81 MIL/uL   Hemoglobin 10.9 (L) 13.0 - 17.0 g/dL   HCT 37.0 (L) 39.0 - 52.0 %   MCV 85.1 80.0 - 100.0 fL   MCH 25.1 (L) 26.0 - 34.0 pg   MCHC 29.5 (L) 30.0 - 36.0 g/dL   RDW 15.8 (H) 11.5 - 15.5 %   Platelets 308 150 - 400 K/uL   nRBC 0.0 0.0 - 0.2 %    Comment: Performed at [facility], [address] [place], [place] [address]  Protime - INR     Status: None   Collection Time: 12/30/21  1:49 PM  Result Value Ref Range   Prothrombin Time 15.2 11.4 - 15.2 seconds   INR 1.2 0.8 - 1.2    Comment: (NOTE) INR goal varies based on device  and disease states. Performed at [facility], [place] [address], [place], [place] [address]    No results found.  Review of Systems  All other systems reviewed and are negative.  Blood pressure (!) 154/116, pulse (!) 120, temperature 97.9 F (36.6 C), temperature source Oral, resp. rate 20, height 6\' 3"  (1.905 m), weight 106.1 kg, SpO2 100 %. Physical Exam Vitals reviewed.  Constitutional:      Appearance: Normal appearance.  HENT:     Head: Normocephalic and atraumatic.  Eyes:     Pupils: Pupils are equal, round, and reactive to light.  Cardiovascular:     Rate and Rhythm: Normal rate.  Pulmonary:     Effort: Pulmonary effort is normal.     Breath sounds: Normal breath sounds.  Abdominal:     Palpations: Abdomen is soft.  Musculoskeletal:     Cervical back: Normal range of motion and neck supple.       Legs:  Neurological:     Mental Status: [name] is alert and oriented to person, place, and time.  Psychiatric:        Behavior: Behavior normal.     Assessment/Plan Chronic left leg wound  Our plan is to proceed to surgery today for an excisional debridement of his left lateral leg wound and then placement of a VAC sponge to help with wound healing.  The patient understands this fully and will be admitted after surgery for continued wound care of the left leg.  Mcarthur Rossetti, MD 12/30/2021, 2:18 PM

## 2021-12-30 NOTE — Anesthesia Procedure Notes (Addendum)
Procedure Name: LMA Insertion Date/Time: 12/30/2021 5:27 PM Performed by: Minerva Ends, CRNA Pre-anesthesia Checklist: Patient identified, Emergency Drugs available, Suction available and Patient being monitored Patient Re-evaluated:Patient Re-evaluated prior to induction Oxygen Delivery Method: Circle system utilized Preoxygenation: Pre-oxygenation with 100% oxygen Induction Type: IV induction Ventilation: Mask ventilation without difficulty LMA: LMA inserted LMA Size: 5.0 Tube type: Oral Number of attempts: 1 Placement Confirmation: positive ETCO2 and breath sounds checked- equal and bilateral Tube secured with: Tape Dental Injury: Teeth and Oropharynx as per pre-operative assessment

## 2021-12-30 NOTE — Transfer of Care (Signed)
Immediate Anesthesia Transfer of Care Note  Patient: [name]  Procedure(s) Performed: IRRIGATION AND DEBRIDEMENT LEFT LOWER LEG WOUND (Left: Leg Lower) APPLICATION OF WOUND VAC (Left: Leg Lower)  Patient Location: PACU  Anesthesia Type:General  Level of Consciousness: awake, alert  and oriented  Airway & Oxygen Therapy: Patient Spontanous Breathing  Post-op Assessment: Report given to RN and Post -op Vital signs reviewed and stable  Post vital signs: Reviewed and stable  Last Vitals:  Vitals Value Taken Time  BP 149/107 12/30/21 1806  Temp    Pulse 116 12/30/21 1807  Resp 15 12/30/21 1807  SpO2 92 % 12/30/21 1807  Vitals shown include unvalidated device data.  Last Pain:  Vitals:   12/30/21 1334  TempSrc: Oral  PainSc: 3          Complications: No notable events documented.

## 2021-12-31 ENCOUNTER — Encounter (HOSPITAL_COMMUNITY): Payer: Self-pay | Admitting: Orthopaedic Surgery

## 2021-12-31 ENCOUNTER — Encounter: Payer: Medicare HMO | Admitting: Orthopaedic Surgery

## 2021-12-31 ENCOUNTER — Telehealth: Payer: Self-pay | Admitting: Orthopaedic Surgery

## 2021-12-31 DIAGNOSIS — R58 Hemorrhage, not elsewhere classified: Secondary | ICD-10-CM | POA: Diagnosis not present

## 2021-12-31 DIAGNOSIS — Z8616 Personal history of COVID-19: Secondary | ICD-10-CM | POA: Diagnosis not present

## 2021-12-31 DIAGNOSIS — Z79890 Hormone replacement therapy (postmenopausal): Secondary | ICD-10-CM | POA: Diagnosis not present

## 2021-12-31 DIAGNOSIS — D649 Anemia, unspecified: Secondary | ICD-10-CM | POA: Diagnosis not present

## 2021-12-31 DIAGNOSIS — Z952 Presence of prosthetic heart valve: Secondary | ICD-10-CM | POA: Diagnosis not present

## 2021-12-31 DIAGNOSIS — Z79899 Other long term (current) drug therapy: Secondary | ICD-10-CM | POA: Diagnosis not present

## 2021-12-31 DIAGNOSIS — I96 Gangrene, not elsewhere classified: Secondary | ICD-10-CM | POA: Diagnosis present

## 2021-12-31 DIAGNOSIS — I5022 Chronic systolic (congestive) heart failure: Secondary | ICD-10-CM | POA: Diagnosis present

## 2021-12-31 DIAGNOSIS — I110 Hypertensive heart disease with heart failure: Secondary | ICD-10-CM | POA: Diagnosis present

## 2021-12-31 DIAGNOSIS — Z8249 Family history of ischemic heart disease and other diseases of the circulatory system: Secondary | ICD-10-CM | POA: Diagnosis not present

## 2021-12-31 DIAGNOSIS — I509 Heart failure, unspecified: Secondary | ICD-10-CM | POA: Diagnosis not present

## 2021-12-31 DIAGNOSIS — Z981 Arthrodesis status: Secondary | ICD-10-CM | POA: Diagnosis not present

## 2021-12-31 DIAGNOSIS — T79A22S Traumatic compartment syndrome of left lower extremity, sequela: Secondary | ICD-10-CM | POA: Diagnosis not present

## 2021-12-31 DIAGNOSIS — I872 Venous insufficiency (chronic) (peripheral): Secondary | ICD-10-CM | POA: Diagnosis present

## 2021-12-31 DIAGNOSIS — Z96653 Presence of artificial knee joint, bilateral: Secondary | ICD-10-CM | POA: Diagnosis present

## 2021-12-31 DIAGNOSIS — Z20822 Contact with and (suspected) exposure to covid-19: Secondary | ICD-10-CM | POA: Diagnosis present

## 2021-12-31 DIAGNOSIS — T45515A Adverse effect of anticoagulants, initial encounter: Secondary | ICD-10-CM | POA: Diagnosis present

## 2021-12-31 DIAGNOSIS — Z833 Family history of diabetes mellitus: Secondary | ICD-10-CM | POA: Diagnosis not present

## 2021-12-31 DIAGNOSIS — Z800 Family history of malignant neoplasm of digestive organs: Secondary | ICD-10-CM | POA: Diagnosis not present

## 2021-12-31 DIAGNOSIS — Z7901 Long term (current) use of anticoagulants: Secondary | ICD-10-CM | POA: Diagnosis not present

## 2021-12-31 DIAGNOSIS — K219 Gastro-esophageal reflux disease without esophagitis: Secondary | ICD-10-CM | POA: Diagnosis present

## 2021-12-31 DIAGNOSIS — Y92009 Unspecified place in unspecified non-institutional (private) residence as the place of occurrence of the external cause: Secondary | ICD-10-CM | POA: Diagnosis not present

## 2021-12-31 DIAGNOSIS — Z7982 Long term (current) use of aspirin: Secondary | ICD-10-CM | POA: Diagnosis not present

## 2021-12-31 DIAGNOSIS — S81802D Unspecified open wound, left lower leg, subsequent encounter: Secondary | ICD-10-CM | POA: Diagnosis present

## 2021-12-31 DIAGNOSIS — S81802A Unspecified open wound, left lower leg, initial encounter: Secondary | ICD-10-CM | POA: Diagnosis not present

## 2021-12-31 DIAGNOSIS — X58XXXS Exposure to other specified factors, sequela: Secondary | ICD-10-CM | POA: Diagnosis present

## 2021-12-31 DIAGNOSIS — Z8719 Personal history of other diseases of the digestive system: Secondary | ICD-10-CM | POA: Diagnosis not present

## 2021-12-31 LAB — PROTIME-INR
INR: 1.2 (ref 0.8–1.2)
Prothrombin Time: 15.4 seconds — ABNORMAL HIGH (ref 11.4–15.2)

## 2021-12-31 MED ORDER — WARFARIN SODIUM 7.5 MG PO TABS
7.5000 mg | ORAL_TABLET | Freq: Once | ORAL | Status: AC
  Administered 2021-12-31: 7.5 mg via ORAL
  Filled 2021-12-31 (×2): qty 1

## 2021-12-31 NOTE — Evaluation (Signed)
Physical Therapy Evaluation & Discharge ?Patient Details ?Name: [name] ?MRN: [id number] ?DOB: [birth year] ?Today's Date: 12/31/2021 ? ?History of Present Illness ? 66 y/o male admitted on 12/30/21 following I&D on L lower leg wound with wound vac application. Plan to return to OR on 3/3 for skin grafting. PMH: seizures, HTN, Afib, CHF, s/p Maze operation for AFib  ?Clinical Impression ? Patient admitted following above procedure. Patient currently WBAT on LLE and functioning at modI level with light use of RW for comfort but able to ambulate in the room with no AD. Educated patient on importance of elevating L LE, patient verbalized understanding. No further skilled PT needs identified acutely. No PT follow up recommended at this time. Encouraged continued mobility with nursing staff and mobility specialists.    ?   ? ?Recommendations for follow up therapy are one component of a multi-disciplinary discharge planning process, led by the attending physician.  Recommendations may be updated based on patient status, additional functional criteria and insurance authorization. ? ?Follow Up Recommendations No PT follow up ? ?  ?Assistance Recommended at Discharge PRN  ?Patient can return home with the following ?   ? ?  ?Equipment Recommendations None recommended by PT  ?Recommendations for Other Services ?    ?  ?Functional Status Assessment Patient has not had a recent decline in their functional status  ? ?  ?Precautions / Restrictions Precautions ?Precautions: Fall ?Precaution Comments: wound vac, strict elevation after mobility ?Restrictions ?Weight Bearing Restrictions: No  ? ?  ? ?Mobility ? Bed Mobility ?Overal bed mobility: Independent ?  ?  ?  ?  ?  ?  ?  ?  ? ?Transfers ?Overall transfer level: Modified independent ?Equipment used: None ?  ?  ?  ?  ?  ?  ?  ?  ?  ? ?Ambulation/Gait ?Ambulation/Gait assistance: Modified independent (Device/Increase time) ?Gait Distance (Feet): 500 Feet (10+10) ?Assistive  device: Rolling Sheldon Sem (2 wheels), None ?Gait Pattern/deviations: WFL(Within Functional Limits) ?  ?Gait velocity interpretation: >4.37 ft/sec, indicative of normal walking speed ?  ?General Gait Details: RW for comfort but not necessarily using it during mobility. Ambulated to/from bathroom without AD ? ?Stairs ?  ?  ?  ?  ?  ? ?Wheelchair Mobility ?  ? ?Modified Rankin (Stroke Patients Only) ?  ? ?  ? ?Balance Overall balance assessment: Mild deficits observed, not formally tested ?  ?  ?  ?  ?  ?  ?  ?  ?  ?  ?  ?  ?  ?  ?  ?  ?  ?  ?   ? ? ? ?Pertinent Vitals/Pain Pain Assessment ?Pain Assessment: No/denies pain  ? ? ?Home Living Family/patient expects to be discharged to:: Private residence ?Living Arrangements: Spouse/significant other ?Available Help at Discharge: Family ?Type of Home: House ?Home Access: Stairs to enter ?Entrance Stairs-Rails: Right ?Entrance Stairs-Number of Steps: 5 ?  ?Home Layout: One level ?Home Equipment: BSC/3in1;Rolling Jevon Shells (2 wheels);Tub bench;Wheelchair - manual;Crutches;Cane - single point ?   ?  ?Prior Function Prior Level of Function : Independent/Modified Independent;Working/employed ?  ?  ?  ?  ?  ?  ?  ?  ?  ? ? ?Hand Dominance  ?   ? ?  ?Extremity/Trunk Assessment  ? Upper Extremity Assessment ?Upper Extremity Assessment: Overall WFL for tasks assessed ?  ? ?Lower Extremity Assessment ?Lower Extremity Assessment: LLE deficits/detail ?LLE Deficits / Details: LLE lower leg wound with wound vac. Strength  4+/5 ?  ? ?Cervical / Trunk Assessment ?Cervical / Trunk Assessment: Normal  ?Communication  ? Communication: No difficulties  ?Cognition Arousal/Alertness: Awake/alert ?Behavior During Therapy: [facility] for tasks assessed/performed ?Overall Cognitive Status: Within Functional Limits for tasks assessed ?  ?  ?  ?  ?  ?  ?  ?  ?  ?  ?  ?  ?  ?  ?  ?  ?  ?  ?  ? ?  ?General Comments   ? ?  ?Exercises    ? ?Assessment/Plan  ?  ?PT Assessment Patient does not need any further PT  services  ?PT Problem List   ? ?   ?  ?PT Treatment Interventions     ? ?PT Goals (Current goals can be found in the Care Plan section)  ?Acute Rehab PT Goals ?Patient Stated Goal: to hurry up and get out of here this weekend ?PT Goal Formulation: All assessment and education complete, DC therapy ? ?  ?Frequency   ?  ? ? ?Co-evaluation   ?  ?  ?  ?  ? ? ?  ?AM-PAC PT "6 Clicks" Mobility  ?Outcome Measure Help needed turning from your back to your side while in a flat bed without using bedrails?: None ?Help needed moving from lying on your back to sitting on the side of a flat bed without using bedrails?: None ?Help needed moving to and from a bed to a chair (including a wheelchair)?: None ?Help needed standing up from a chair using your arms (e.g., wheelchair or bedside chair)?: None ?Help needed to walk in hospital room?: None ?Help needed climbing 3-5 steps with a railing? : None ?6 Click Score: 24 ? ?  ?End of Session   ?Activity Tolerance: Patient tolerated treatment well ?Patient left: in bed;with call bell/phone within reach;with bed alarm set ?Nurse Communication: Mobility status ?PT Visit Diagnosis: Muscle weakness (generalized) (M62.81) ?  ? ?Time: 8921-1941 ?PT Time Calculation (min) (ACUTE ONLY): 34 min ? ? ?Charges:   PT Evaluation ?$PT Eval Low Complexity: 1 Low ?PT Treatments ?$Gait Training: 8-22 mins ?  ?   ? ? ?Zykeem Bauserman A. Gilford Rile, PT, DPT ?Acute Rehabilitation Services ?Pager [phone] ?Office [phone] ? ? ?Breyana Follansbee A Jedediah Noda ?12/31/2021, 1:58 PM ? ?

## 2021-12-31 NOTE — Telephone Encounter (Signed)
Aware not to hold coumadin  ?

## 2021-12-31 NOTE — Telephone Encounter (Signed)
[facility] called and is wondering if they need to hold the warsrin since he is having a procedure Friday?  ? ?CB [phone] ?

## 2021-12-31 NOTE — Progress Notes (Signed)
Patient ID: [name], male   DOB: September 05, 1956, 66 y.o.   MRN: [id number] ?The patient tolerated surgery well yesterday on his left leg wound.  We were able to debride to a stable margin with good granulation tissue.  He has a cleanse choice over his left leg wound.  There is strict elevation of the leg so the swelling is diminished.  I talked him in length in detail about my partner Dr. Sharol Given taking the patient to the operating room this Friday for skin grafting the wound with a biologic allograft skin graft.  The patient does need to be in the hospital and we will change him to an inpatient admission because he needs this VAC sponge on the leg and strict elevation prior to a skin grafting procedure this Friday.  The patient understands this as well. ?

## 2021-12-31 NOTE — TOC Progression Note (Signed)
Transition of Care (TOC) - Progression Note  ? ? ?Patient Details  ?Name: [name] ?MRN: [id number] ?Date of Birth: 08-22-1956 ? ?Transition of Care (TOC) CM/SW Contact  ?Zenon Mayo, RN ?Phone Number: ?12/31/2021, 5:05 PM ? ?Clinical Narrative:    ?From home with spouse, admitted on 12/30/21 following I&D on L lower leg wound with wound vac application. Plan to return to OR on 3/3 for skin grafting. TOC will continue to follow for dc needs. ? ? ?  ?  ? ?Expected Discharge Plan and Services ?  ?  ?  ?  ?  ?                ?  ?  ?  ?  ?  ?  ?  ?  ?  ?  ? ? ?Social Determinants of Health (SDOH) Interventions ?  ? ?Readmission Risk Interventions ?No flowsheet data found. ? ?

## 2021-12-31 NOTE — Progress Notes (Signed)
ANTICOAGULATION CONSULT NOTE - Follow Up Consult ? ?Pharmacy Consult for Warfarin ?Indication: atrial fibrillation ? ?No Known Allergies ? ?Patient Measurements: ?Height: 6\' 3"  (190.5 cm) ?Weight: 106.1 kg (234 lb) ?IBW/kg (Calculated) : 84.5 ? ?Vital Signs: ?Temp: 97.8 ?F (36.6 ?C) (03/01 1021) ?Temp Source: Oral (03/01 1021) ?BP: 107/86 (03/01 1021) ?Pulse Rate: 109 (03/01 1021) ? ?Labs: ?Recent Labs  ?  12/30/21 ?1349 12/31/21 ?4847  ?HGB 10.9*  --   ?HCT 37.0*  --   ?PLT 308  --   ?LABPROT 15.2 15.4*  ?INR 1.2 1.2  ?CREATININE 1.19  --   ? ? ?Estimated Creatinine Clearance: 80.4 mL/min (by C-G formula based on SCr of 1.19 mg/dL). ? ?Assessment: ?66 y.o. M s/p I&D of chronic L leg wound with wound vac applied. Pt on warfarin PTA for afib. Admission INR 1.2. CBC ok on admission. ? ?  Warfarin resumed with 7.5 mg x 1 on 2/28 pm post-op I&D.  INR remains 1.2  Noted possible surgery on Friday 3/3 for skin grafting.  Per call to Dr. Trevor Mace office, no need to hold Warfarin. ? ?  Home warfarin regimen: 5 mg daily except, 7.5 mg on Wednsdays (last dose PTA 2/23) ?  ?Goal of Therapy:  ?INR 2-3 ?Monitor platelets by anticoagulation protocol: Yes ?  ?Plan:  ?Warfarin 7.5 mg x 1 again today. ?Daily PT/INR. ? ?Arty Baumgartner, RPh ?12/31/2021,1:16 PM ? ? ?

## 2022-01-01 DIAGNOSIS — I872 Venous insufficiency (chronic) (peripheral): Secondary | ICD-10-CM

## 2022-01-01 DIAGNOSIS — R58 Hemorrhage, not elsewhere classified: Secondary | ICD-10-CM

## 2022-01-01 DIAGNOSIS — T45515A Adverse effect of anticoagulants, initial encounter: Secondary | ICD-10-CM

## 2022-01-01 LAB — PROTIME-INR
INR: 1.5 — ABNORMAL HIGH (ref 0.8–1.2)
Prothrombin Time: 18.2 seconds — ABNORMAL HIGH (ref 11.4–15.2)

## 2022-01-01 MED ORDER — TRANEXAMIC ACID 1000 MG/10ML IV SOLN
2000.0000 mg | INTRAVENOUS | Status: AC
  Administered 2022-01-02: 2000 mg via TOPICAL
  Filled 2022-01-01: qty 20

## 2022-01-01 MED ORDER — WARFARIN SODIUM 5 MG PO TABS
5.0000 mg | ORAL_TABLET | Freq: Once | ORAL | Status: AC
  Administered 2022-01-01: 5 mg via ORAL
  Filled 2022-01-01: qty 1

## 2022-01-01 NOTE — Progress Notes (Signed)
Mobility Specialist Progress Note  ? ? 01/01/22 1704  ?Mobility  ?Activity Ambulated independently in hallway  ?Level of Assistance Modified independent, requires aide device or extra time  ?Assistive Device Front wheel walker  ?Distance Ambulated (ft) 400 ft  ?Activity Response Tolerated well  ?$Mobility charge 1 Mobility  ? ?Pt received in bed and agreeable. No complaints. Returned to bed with call bell in reach.  ? ?Hildred Alamin ?Mobility Specialist  ?M.S. 5N: [phone]  ?

## 2022-01-01 NOTE — Consult Note (Signed)
ORTHOPAEDIC CONSULTATION  REQUESTING PHYSICIAN: Mcarthur Rossetti,*  Chief Complaint: Chronic wound from compartment syndrome left leg  HPI: [name] is a 66 y.o. male who presents with chronic wound secondary to compartment syndrome left leg.  Patient was on Coumadin for his multiple cardiac conditions.  Sustained a compartment syndrome underwent debridement previous skin grafting and has had wound dehiscence and most recently repeat debridement.  Past Medical History:  Diagnosis Date   Acute on chronic systolic (congestive) heart failure (HCC) 12/30/2018   Allergy    Arthritis    Atrial flutter with rapid ventricular response (HCC)    Chronic systolic (congestive) heart failure ([place])    COVID 10/2021   mild   Dilated aortic root ([place]) 01/04/2019   Dilated cardiomyopathy ([place]) 01/04/2019   GERD (gastroesophageal reflux disease)    history of   Heart murmur    History of colon polyps    Hypertension    Insomnia    Mitral valve prolapse    Mitral valve regurgitation    Persistent atrial fibrillation ([place]) 12/02/2018   S/P Maze operation for atrial fibrillation 03/21/2019   Complete bilateral atrial lesion set using cryothermy and bipolar radiofrequency ablation with clipping of LA appendage via right mini thoracotomy approach   S/P minimally invasive mitral valve repair 03/21/2019   Complex valvuloplasty including triangular resection of posterior leaflet, artificial Gore-tex neochord placement x4 and 36 mm Sorin Memo 4D ring annuloplasty via right mini thoracotomy approach   Seizures ([place])    had one approx. 30 yrs ago,has not had any since   Severe mitral regurgitation    Past Surgical History:  Procedure Laterality Date   ANKLE FUSION  left   Dec. 7124   APPLICATION OF WOUND VAC Left 09/22/2021   Procedure: APPLICATION OF WOUND VAC;  Surgeon: Mcarthur Rossetti, MD;  Location: [place];  Service: Orthopedics;  Laterality: Left;   APPLICATION OF WOUND  VAC Left 12/30/2021   Procedure: APPLICATION OF WOUND VAC;  Surgeon: Mcarthur Rossetti, MD;  Location: [place];  Service: Orthopedics;  Laterality: Left;   ARTHROSCOPY KNEE W/ DRILLING  bilateral   2012   CARDIAC VALVE REPLACEMENT     CARDIOVERSION N/A 01/03/2019   Procedure: CARDIOVERSION;  Surgeon: Sueanne Margarita, MD;  Location: [place];  Service: Cardiovascular;  Laterality: N/A;   CARDIOVERSION N/A 06/23/2019   Procedure: CARDIOVERSION;  Surgeon: Fay Records, MD;  Location: [place];  Service: Cardiovascular;  Laterality: N/A;   CLIPPING OF ATRIAL APPENDAGE  03/21/2019   Procedure: Clipping Of Atrial Appendage using 47mm Atricure Pro2 Clip;  Surgeon: Rexene Alberts, MD;  Location: [place];  Service: Open Heart Surgery;;   COLONOSCOPY     x2   FOOT ARTHRODESIS  06/23/2012   Procedure: ARTHRODESIS FOOT;  Surgeon: Wylene Simmer, MD;  Location: [place];  Service: Orthopedics;  Laterality: Left;  Left Subtalar and Talonavicular Joint Revision Arthrodesis  Aspiration of Bone Marrow from Left Hip    HAIR TRANSPLANT     HARDWARE REMOVAL  06/23/2012   Procedure: HARDWARE REMOVAL;  Surgeon: Wylene Simmer, MD;  Location: [place];  Service: Orthopedics;  Laterality: Left;  Removal of Deep Implant  X's 3   I & D EXTREMITY Left 09/23/2021   Procedure: IRRIGATION AND DEBRIDEMENT OF LEG;  Surgeon: Mcarthur Rossetti, MD;  Location: [place];  Service: Orthopedics;  Laterality: Left;   I & D EXTREMITY Left 09/26/2021   Procedure: REPEAT IRRIGATION AND  DEBRIDEMENT LEFT LEG, POSSIBLE WOUND CLOSURE, POSSIBLE VAC CHANGE, POSSIBLE SKIN GRAFT;  Surgeon: Mcarthur Rossetti, MD;  Location: [place];  Service: Orthopedics;  Laterality: Left;   I & D EXTREMITY Left 12/30/2021   Procedure: IRRIGATION AND DEBRIDEMENT LEFT LOWER LEG WOUND;  Surgeon: Mcarthur Rossetti, MD;  Location: [place];  Service: Orthopedics;  Laterality: Left;   INCISION AND DRAINAGE WOUND WITH FASCIOTOMY Left 09/22/2021   Procedure:  INCISION AND DRAINAGE WOUND WITH FASCIOTOMY;  Surgeon: Mcarthur Rossetti, MD;  Location: [place];  Service: Orthopedics;  Laterality: Left;   INSERTION OF MESH N/A 07/10/2016   Procedure: INSERTION OF MESH;  Surgeon: Coralie Keens, MD;  Location: [place];  Service: General;  Laterality: N/A;   JOINT REPLACEMENT     right hip  01-2011   LEFT HEART CATH AND CORONARY ANGIOGRAPHY N/A 03/16/2019   Procedure: LEFT HEART CATH AND CORONARY ANGIOGRAPHY;  Surgeon: Sherren Mocha, MD;  Location: [place] [facility];  Service: Cardiovascular;  Laterality: N/A;   LIMB SPARING RESECTION HIP W/ SADDLE JOINT REPLACEMENT Right    MINIMALLY INVASIVE MAZE PROCEDURE N/A 03/21/2019   Procedure: MINIMALLY INVASIVE MAZE PROCEDURE;  Surgeon: Rexene Alberts, MD;  Location: [place];  Service: Open Heart Surgery;  Laterality: N/A;   MITRAL VALVE REPAIR Right 03/21/2019   Procedure: MINIMALLY INVASIVE MITRAL VALVE REPAIR (MVR) using Memo 4D ring size 36;  Surgeon: Rexene Alberts, MD;  Location: [place];  Service: Open Heart Surgery;  Laterality: Right;   TEE WITHOUT CARDIOVERSION N/A 01/03/2019   Procedure: TRANSESOPHAGEAL ECHOCARDIOGRAM (TEE);  Surgeon: Sueanne Margarita, MD;  [facility]: [facility];  Service: Cardiovascular;  Laterality: N/A;   TEE WITHOUT CARDIOVERSION N/A 03/21/2019   Procedure: TRANSESOPHAGEAL ECHOCARDIOGRAM (TEE);  Surgeon: Rexene Alberts, MD;  Location: [place];  Service: Open Heart Surgery;  Laterality: N/A;   TEMPORARY PACEMAKER N/A 03/22/2019   Procedure: TEMPORARY PACEMAKER;  Surgeon: Belva Crome, MD;  Location: [place] [facility];  Service: Cardiovascular;  Laterality: N/A;   TOTAL KNEE ARTHROPLASTY Right 01/19/2014   Procedure: RIGHT TOTAL KNEE ARTHROPLASTY, Steroid injection left knee;  Surgeon: Mcarthur Rossetti, MD;  Location: [facility];  Service: Orthopedics;  Laterality: Right;   TOTAL KNEE ARTHROPLASTY Left 10/23/2014   Procedure: LEFT TOTAL KNEE ARTHROPLASTY;  Surgeon:  Mcarthur Rossetti, MD;  Location: [facility];  Service: Orthopedics;  Laterality: Left;   UMBILICAL HERNIA REPAIR N/A 07/10/2016   Procedure: UMBILICAL HERNIA REPAIR;  Surgeon: Coralie Keens, MD;  Location: [place];  Service: General;  Laterality: N/A;   Social History   Socioeconomic History   Marital status: Married    Spouse name: Not on file   Number of children: 1   Years of education: Not on file   Highest education level: Not on file  Occupational History   Occupation: Pension scheme manager  Tobacco Use   Smoking status: Never   Smokeless tobacco: Never  Vaping Use   Vaping Use: Never used  Substance and Sexual Activity   Alcohol use: Yes    Alcohol/week: 2.0 - 4.0 standard drinks    Types: 1 - 2 Glasses of wine, 1 - 2 Standard drinks or equivalent per week    Comment: 2 ounces of alcohol a day   Drug use: No   Sexual activity: Yes  Other Topics Concern   Not on file  Social History Narrative   Not on file   Social Determinants of Health  Financial Resource Strain: Not on file  Food Insecurity: Not on file  Transportation Needs: Not on file  Physical Activity: Not on file  Stress: Not on file  Social Connections: Not on file   Family History  Problem Relation Age of Onset   Hypertension Father    Colon cancer Father        dx in his late 88's   Diabetes Mother    Stomach cancer Neg Hx    Esophageal cancer Neg Hx    Pancreatic cancer Neg Hx    Rectal cancer Neg Hx    - negative except otherwise stated in the family history section No Known Allergies Prior to Admission medications   Medication Sig Start Date End Date Taking? Authorizing Provider  ALPRAZolam Duanne Moron) 0.5 MG tablet Take 0.5 mg by mouth 2 (two) times daily. 12/16/21  Yes [provider]  aspirin EC 81 MG EC tablet Take 1 tablet (81 mg total) by mouth daily. 03/27/19  Yes Gold, Wayne E, PA-C  atorvastatin (LIPITOR) 20 MG tablet Take 20 mg by mouth daily.   Yes [provider]  docusate sodium (COLACE) 100 MG capsule Take 300 mg by mouth daily.   Yes [provider]  HYDROcodone-acetaminophen (NORCO) 10-325 MG tablet Take 1 tablet by mouth 2 (two) times daily as needed for severe pain. 11/25/21  Yes [provider]  losartan (COZAAR) 50 MG tablet Take 1 tablet by mouth once daily 12/15/21  Yes Lorretta Harp, MD  Melatonin 10 MG TABS Take 10 mg by mouth at bedtime.   Yes [provider]  metoprolol tartrate (LOPRESSOR) 25 MG tablet Take 1/2 (one-half) tablet by mouth twice daily Patient taking differently: Take 12.5 mg by mouth 2 (two) times daily. 03/17/21  Yes Lorretta Harp, MD  Multiple Vitamin (MULTIVITAMIN WITH MINERALS) TABS tablet Take 1 tablet by mouth daily.   Yes [provider]  Multiple Vitamins-Minerals (MULTIVITAMIN WITH MINERALS) tablet Take 1 tablet by mouth daily.   Yes [provider]  potassium chloride (KLOR-CON M) 10 MEQ tablet Take 1 tablet (10 mEq total) by mouth 2 (two) times daily. **Patient need a appointment for future refill** 10/20/21  Yes Lorretta Harp, MD  spironolactone (ALDACTONE) 25 MG tablet Take 1 tablet by mouth once daily 12/08/21  Yes Lorretta Harp, MD  torsemide (DEMADEX) 20 MG tablet Take 1 tablet (20 mg total) by mouth daily. 03/18/21  Yes Lorretta Harp, MD  warfarin (COUMADIN) 5 MG tablet Take 1-1.5 tablets (5-7.5 mg total) by mouth See admin instructions. Takes 5 mg daily except Wednesday. Takes 7.5 mg once daily on Wednesday 11/04/21  Yes Lorretta Harp, MD  methocarbamol (ROBAXIN) 500 MG tablet Take 1 tablet (500 mg total) by mouth 3 (three) times daily. Patient not taking: Reported on 12/25/2021 10/08/21   Pete Pelt, PA-C  oxyCODONE (OXY IR/ROXICODONE) 5 MG immediate release tablet Take 1-2 tablets (5-10 mg total) by mouth every 6 (six) hours as needed for moderate pain (pain score 4-6). 12/11/21   Mcarthur Rossetti, MD  pregabalin (LYRICA) 75  MG capsule Take 1 capsule (75 mg total) by mouth 2 (two) times daily. Patient not taking: Reported on 12/25/2021 10/09/21   Mcarthur Rossetti, MD  tiZANidine (ZANAFLEX) 4 MG tablet Take 1 tablet (4 mg total) by mouth every 8 (eight) hours as needed for muscle spasms. Patient not taking: Reported on 12/25/2021 10/28/21   Mcarthur Rossetti, MD   No  results found. - pertinent xrays, CT, MRI studies were reviewed and independently interpreted  Positive ROS: All other systems have been reviewed and were otherwise negative with the exception of those mentioned in the HPI and as above.  Physical Exam: General: Alert, no acute distress Psychiatric: Patient is competent for consent with normal mood and affect Lymphatic: No axillary or cervical lymphadenopathy Cardiovascular: No pedal edema Respiratory: No cyanosis, no use of accessory musculature GI: No organomegaly, abdomen is soft and non-tender    Images:  @ENCIMAGES @  Labs:  Lab Results  Component Value Date   HGBA1C 5.2 03/16/2019    Lab Results  Component Value Date   ALBUMIN 4.1 09/22/2021   ALBUMIN 3.5 03/16/2019   ALBUMIN 2.9 (L) 01/03/2019     CBC EXTENDED Latest Ref Rng & Units 12/30/2021 09/26/2021 09/25/2021  WBC 4.0 - 10.5 K/uL 10.1 8.5 9.7  RBC 4.22 - 5.81 MIL/uL 4.35 3.10(L) 2.53(L)  HGB 13.0 - 17.0 g/dL 10.9(L) 9.6(L) 7.8(L)  HCT 39.0 - 52.0 % 37.0(L) 29.2(L) 24.2(L)  PLT 150 - 400 K/uL 308 184 119(L)  NEUTROABS 1.7 - 7.7 K/uL - - -  LYMPHSABS 0.7 - 4.0 K/uL - - -    Neurologic: Patient does not have protective sensation bilateral lower extremities.   MUSCULOSKELETAL:   Skin: Examination patient has a wound VAC in place that is functioning well.  There is 200 cc in the wound VAC canister.  He has a palpable dorsalis pedis pulse with venous swelling pitting edema in the foot.  Previous images shows healthy granulation tissue within the wound bed.  Patient has chronic venous insufficiency with  pitting edema brawny skin color changes.  Patient's hemoglobin is stable at 10.9 white cell count 10.1.  Assessment: Assessment: Chronic wound left leg with venous insufficiency status post treatment for compartment syndrome secondary to Coumadin.  Plan: Plan: We will plan for repeat debridement on Friday with application of Kerecis skin graft.  Postoperative course was discussed with potential for further biologic tissue grafting.  Patient states he understands wishes to proceed at this time.  Thank you for the consult and the opportunity to see [name], [place] [phone] 7:44 AM

## 2022-01-01 NOTE — H&P (View-Only) (Signed)
ORTHOPAEDIC CONSULTATION  REQUESTING PHYSICIAN: Mcarthur Rossetti,*  Chief Complaint: Chronic wound from compartment syndrome left leg  HPI: [name] is a 66 y.o. male who presents with chronic wound secondary to compartment syndrome left leg.  Patient was on Coumadin for his multiple cardiac conditions.  Sustained a compartment syndrome underwent debridement previous skin grafting and has had wound dehiscence and most recently repeat debridement.  Past Medical History:  Diagnosis Date   Acute on chronic systolic (congestive) heart failure (HCC) 12/30/2018   Allergy    Arthritis    Atrial flutter with rapid ventricular response (HCC)    Chronic systolic (congestive) heart failure ([place])    COVID 10/2021   mild   Dilated aortic root ([place]) 01/04/2019   Dilated cardiomyopathy ([place]) 01/04/2019   GERD (gastroesophageal reflux disease)    history of   Heart murmur    History of colon polyps    Hypertension    Insomnia    Mitral valve prolapse    Mitral valve regurgitation    Persistent atrial fibrillation ([place]) 12/02/2018   S/P Maze operation for atrial fibrillation 03/21/2019   Complete bilateral atrial lesion set using cryothermy and bipolar radiofrequency ablation with clipping of LA appendage via right mini thoracotomy approach   S/P minimally invasive mitral valve repair 03/21/2019   Complex valvuloplasty including triangular resection of posterior leaflet, artificial Gore-tex neochord placement x4 and 36 mm Sorin Memo 4D ring annuloplasty via right mini thoracotomy approach   Seizures ([place])    had one approx. 30 yrs ago,has not had any since   Severe mitral regurgitation    Past Surgical History:  Procedure Laterality Date   ANKLE FUSION  left   Dec. 8413   APPLICATION OF WOUND VAC Left 09/22/2021   Procedure: APPLICATION OF WOUND VAC;  Surgeon: Mcarthur Rossetti, MD;  Location: [facility];  Service: Orthopedics;  Laterality: Left;   APPLICATION OF WOUND  VAC Left 12/30/2021   Procedure: APPLICATION OF WOUND VAC;  Surgeon: Mcarthur Rossetti, MD;  Location: [place];  Service: Orthopedics;  Laterality: Left;   ARTHROSCOPY KNEE W/ DRILLING  bilateral   2012   CARDIAC VALVE REPLACEMENT     CARDIOVERSION N/A 01/03/2019   Procedure: CARDIOVERSION;  Surgeon: Sueanne Margarita, MD;  Location: [place];  Service: Cardiovascular;  Laterality: N/A;   CARDIOVERSION N/A 06/23/2019   Procedure: CARDIOVERSION;  Surgeon: Fay Records, MD;  Location: [place];  Service: Cardiovascular;  Laterality: N/A;   CLIPPING OF ATRIAL APPENDAGE  03/21/2019   Procedure: Clipping Of Atrial Appendage using 79mm Atricure Pro2 Clip;  Surgeon: Rexene Alberts, MD;  Location: [facility];  Service: Open Heart Surgery;;   COLONOSCOPY     x2   FOOT ARTHRODESIS  06/23/2012   Procedure: ARTHRODESIS FOOT;  Surgeon: Wylene Simmer, MD;  Location: [place];  Service: Orthopedics;  Laterality: Left;  Left Subtalar and Talonavicular Joint Revision Arthrodesis  Aspiration of Bone Marrow from Left Hip    HAIR TRANSPLANT     HARDWARE REMOVAL  06/23/2012   Procedure: HARDWARE REMOVAL;  Surgeon: Wylene Simmer, MD;  Location: [place];  Service: Orthopedics;  Laterality: Left;  Removal of Deep Implant  X's 3   I & D EXTREMITY Left 09/23/2021   Procedure: IRRIGATION AND DEBRIDEMENT OF LEG;  Surgeon: Mcarthur Rossetti, MD;  Location: [place] [facility];  Service: Orthopedics;  Laterality: Left;   I & D EXTREMITY Left 09/26/2021   Procedure: REPEAT IRRIGATION AND  DEBRIDEMENT LEFT LEG, POSSIBLE WOUND CLOSURE, POSSIBLE VAC CHANGE, POSSIBLE SKIN GRAFT;  Surgeon: Mcarthur Rossetti, MD;  Location: [facility];  Service: Orthopedics;  Laterality: Left;   I & D EXTREMITY Left 12/30/2021   Procedure: IRRIGATION AND DEBRIDEMENT LEFT LOWER LEG WOUND;  Surgeon: Mcarthur Rossetti, MD;  Location: [facility];  Service: Orthopedics;  Laterality: Left;   INCISION AND DRAINAGE WOUND WITH FASCIOTOMY Left 09/22/2021   Procedure:  INCISION AND DRAINAGE WOUND WITH FASCIOTOMY;  Surgeon: Mcarthur Rossetti, MD;  Location: [facility];  Service: Orthopedics;  Laterality: Left;   INSERTION OF MESH N/A 07/10/2016   Procedure: INSERTION OF MESH;  Surgeon: Coralie Keens, MD;  Location: [facility];  Service: General;  Laterality: N/A;   JOINT REPLACEMENT     right hip  01-2011   LEFT HEART CATH AND CORONARY ANGIOGRAPHY N/A 03/16/2019   Procedure: LEFT HEART CATH AND CORONARY ANGIOGRAPHY;  Surgeon: Sherren Mocha, MD;  Location: [facility];  Service: Cardiovascular;  Laterality: N/A;   LIMB SPARING RESECTION HIP W/ SADDLE JOINT REPLACEMENT Right    MINIMALLY INVASIVE MAZE PROCEDURE N/A 03/21/2019   Procedure: MINIMALLY INVASIVE MAZE PROCEDURE;  Surgeon: Rexene Alberts, MD;  Location: [facility];  Service: Open Heart Surgery;  Laterality: N/A;   MITRAL VALVE REPAIR Right 03/21/2019   Procedure: MINIMALLY INVASIVE MITRAL VALVE REPAIR (MVR) using Memo 4D ring size 36;  Surgeon: Rexene Alberts, MD;  Location: [facility];  Service: Open Heart Surgery;  Laterality: Right;   TEE WITHOUT CARDIOVERSION N/A 01/03/2019   Procedure: TRANSESOPHAGEAL ECHOCARDIOGRAM (TEE);  Surgeon: Sueanne Margarita, MD;  [facility];  Service: Cardiovascular;  Laterality: N/A;   TEE WITHOUT CARDIOVERSION N/A 03/21/2019   Procedure: TRANSESOPHAGEAL ECHOCARDIOGRAM (TEE);  Surgeon: Rexene Alberts, MD;  Location: [place];  Service: Open Heart Surgery;  Laterality: N/A;   TEMPORARY PACEMAKER N/A 03/22/2019   Procedure: TEMPORARY PACEMAKER;  Surgeon: Belva Crome, MD;  Location: [place] [facility];  Service: Cardiovascular;  Laterality: N/A;   TOTAL KNEE ARTHROPLASTY Right 01/19/2014   Procedure: RIGHT TOTAL KNEE ARTHROPLASTY, Steroid injection left knee;  Surgeon: Mcarthur Rossetti, MD;  Location: [facility];  Service: Orthopedics;  Laterality: Right;   TOTAL KNEE ARTHROPLASTY Left 10/23/2014   Procedure: LEFT TOTAL KNEE ARTHROPLASTY;  Surgeon:  Mcarthur Rossetti, MD;  Location: [facility];  Service: Orthopedics;  Laterality: Left;   UMBILICAL HERNIA REPAIR N/A 07/10/2016   Procedure: UMBILICAL HERNIA REPAIR;  Surgeon: Coralie Keens, MD;  Location: [place];  Service: General;  Laterality: N/A;   Social History   Socioeconomic History   Marital status: Married    Spouse name: Not on file   Number of children: 1   Years of education: Not on file   Highest education level: Not on file  Occupational History   Occupation: Pension scheme manager  Tobacco Use   Smoking status: Never   Smokeless tobacco: Never  Vaping Use   Vaping Use: Never used  Substance and Sexual Activity   Alcohol use: Yes    Alcohol/week: 2.0 - 4.0 standard drinks    Types: 1 - 2 Glasses of wine, 1 - 2 Standard drinks or equivalent per week    Comment: 2 ounces of alcohol a day   Drug use: No   Sexual activity: Yes  Other Topics Concern   Not on file  Social History Narrative   Not on file   Social Determinants of Health  Financial Resource Strain: Not on file  Food Insecurity: Not on file  Transportation Needs: Not on file  Physical Activity: Not on file  Stress: Not on file  Social Connections: Not on file   Family History  Problem Relation Age of Onset   Hypertension Father    Colon cancer Father        dx in his late 44's   Diabetes Mother    Stomach cancer Neg Hx    Esophageal cancer Neg Hx    Pancreatic cancer Neg Hx    Rectal cancer Neg Hx    - negative except otherwise stated in the family history section No Known Allergies Prior to Admission medications   Medication Sig Start Date End Date Taking? Authorizing Provider  ALPRAZolam Duanne Moron) 0.5 MG tablet Take 0.5 mg by mouth 2 (two) times daily. 12/16/21  Yes [provider]  aspirin EC 81 MG EC tablet Take 1 tablet (81 mg total) by mouth daily. 03/27/19  Yes Gold, Wayne E, PA-C  atorvastatin (LIPITOR) 20 MG tablet Take 20 mg by mouth daily.   Yes [provider]  docusate sodium (COLACE) 100 MG capsule Take 300 mg by mouth daily.   Yes [provider]  HYDROcodone-acetaminophen (NORCO) 10-325 MG tablet Take 1 tablet by mouth 2 (two) times daily as needed for severe pain. 11/25/21  Yes [provider]  losartan (COZAAR) 50 MG tablet Take 1 tablet by mouth once daily 12/15/21  Yes Lorretta Harp, MD  Melatonin 10 MG TABS Take 10 mg by mouth at bedtime.   Yes [provider]  metoprolol tartrate (LOPRESSOR) 25 MG tablet Take 1/2 (one-half) tablet by mouth twice daily Patient taking differently: Take 12.5 mg by mouth 2 (two) times daily. 03/17/21  Yes Lorretta Harp, MD  Multiple Vitamin (MULTIVITAMIN WITH MINERALS) TABS tablet Take 1 tablet by mouth daily.   Yes [provider]  Multiple Vitamins-Minerals (MULTIVITAMIN WITH MINERALS) tablet Take 1 tablet by mouth daily.   Yes [provider]  potassium chloride (KLOR-CON M) 10 MEQ tablet Take 1 tablet (10 mEq total) by mouth 2 (two) times daily. **Patient need a appointment for future refill** 10/20/21  Yes Lorretta Harp, MD  spironolactone (ALDACTONE) 25 MG tablet Take 1 tablet by mouth once daily 12/08/21  Yes Lorretta Harp, MD  torsemide (DEMADEX) 20 MG tablet Take 1 tablet (20 mg total) by mouth daily. 03/18/21  Yes Lorretta Harp, MD  warfarin (COUMADIN) 5 MG tablet Take 1-1.5 tablets (5-7.5 mg total) by mouth See admin instructions. Takes 5 mg daily except Wednesday. Takes 7.5 mg once daily on Wednesday 11/04/21  Yes Lorretta Harp, MD  methocarbamol (ROBAXIN) 500 MG tablet Take 1 tablet (500 mg total) by mouth 3 (three) times daily. Patient not taking: Reported on 12/25/2021 10/08/21   Pete Pelt, PA-C  oxyCODONE (OXY IR/ROXICODONE) 5 MG immediate release tablet Take 1-2 tablets (5-10 mg total) by mouth every 6 (six) hours as needed for moderate pain (pain score 4-6). 12/11/21   Mcarthur Rossetti, MD  pregabalin (LYRICA) 75  MG capsule Take 1 capsule (75 mg total) by mouth 2 (two) times daily. Patient not taking: Reported on 12/25/2021 10/09/21   Mcarthur Rossetti, MD  tiZANidine (ZANAFLEX) 4 MG tablet Take 1 tablet (4 mg total) by mouth every 8 (eight) hours as needed for muscle spasms. Patient not taking: Reported on 12/25/2021 10/28/21   Mcarthur Rossetti, MD   No  results found. - pertinent xrays, CT, MRI studies were reviewed and independently interpreted  Positive ROS: All other systems have been reviewed and were otherwise negative with the exception of those mentioned in the HPI and as above.  Physical Exam: General: Alert, no acute distress Psychiatric: Patient is competent for consent with normal mood and affect Lymphatic: No axillary or cervical lymphadenopathy Cardiovascular: No pedal edema Respiratory: No cyanosis, no use of accessory musculature GI: No organomegaly, abdomen is soft and non-tender    Images:  @ENCIMAGES @  Labs:  Lab Results  Component Value Date   HGBA1C 5.2 03/16/2019    Lab Results  Component Value Date   ALBUMIN 4.1 09/22/2021   ALBUMIN 3.5 03/16/2019   ALBUMIN 2.9 (L) 01/03/2019     CBC EXTENDED Latest Ref Rng & Units 12/30/2021 09/26/2021 09/25/2021  WBC 4.0 - 10.5 K/uL 10.1 8.5 9.7  RBC 4.22 - 5.81 MIL/uL 4.35 3.10(L) 2.53(L)  HGB 13.0 - 17.0 g/dL 10.9(L) 9.6(L) 7.8(L)  HCT 39.0 - 52.0 % 37.0(L) 29.2(L) 24.2(L)  PLT 150 - 400 K/uL 308 184 119(L)  NEUTROABS 1.7 - 7.7 K/uL - - -  LYMPHSABS 0.7 - 4.0 K/uL - - -    Neurologic: Patient does not have protective sensation bilateral lower extremities.   MUSCULOSKELETAL:   Skin: Examination patient has a wound VAC in place that is functioning well.  There is 200 cc in the wound VAC canister.  He has a palpable dorsalis pedis pulse with venous swelling pitting edema in the foot.  Previous images shows healthy granulation tissue within the wound bed.  Patient has chronic venous insufficiency with  pitting edema brawny skin color changes.  Patient's hemoglobin is stable at 10.9 white cell count 10.1.  Assessment: Assessment: Chronic wound left leg with venous insufficiency status post treatment for compartment syndrome secondary to Coumadin.  Plan: Plan: We will plan for repeat debridement on Friday with application of Kerecis skin graft.  Postoperative course was discussed with potential for further biologic tissue grafting.  Patient states he understands wishes to proceed at this time.  Thank you for the consult and the opportunity to see [name], [place] [phone] 7:44 AM

## 2022-01-01 NOTE — Progress Notes (Signed)
ANTICOAGULATION CONSULT NOTE - Follow Up Consult ? ?Pharmacy Consult for Warfarin ?Indication: atrial fibrillation ? ?No Known Allergies ? ?Patient Measurements: ?Height: 6\' 3"  (190.5 cm) ?Weight: 106.1 kg (234 lb) ?IBW/kg (Calculated) : 84.5 ? ?Vital Signs: ?Temp: 98.2 ?F (36.8 ?C) (03/02 0746) ?BP: 122/87 (03/02 0746) ?Pulse Rate: 99 (03/02 0746) ? ?Labs: ?Recent Labs  ?  12/30/21 ?1349 12/31/21 ?5364 01/01/22 ?0248  ?HGB 10.9*  --   --   ?HCT 37.0*  --   --   ?PLT 308  --   --   ?LABPROT 15.2 15.4* 18.2*  ?INR 1.2 1.2 1.5*  ?CREATININE 1.19  --   --   ? ? ? ?Estimated Creatinine Clearance: 80.4 mL/min (by C-G formula based on SCr of 1.19 mg/dL). ? ?Assessment: ?66 y.o. M s/p I&D of chronic L leg wound with wound vac applied. Pt on warfarin PTA for afib. Admission INR 1.2. CBC ok on admission. ? ?  Warfarin resumed 2/28 post-op I&D.  INR 1.2 > 1.5 after Warfarin 7.5 mg x 2 doses given.  For repeat I&D and skin grafting on 3/3.  Per Dr. Sharol Given, no need to hold Warfarin.   ? ?  Home warfarin regimen: 5 mg daily except, 7.5 mg on Wednsdays (last dose PTA 2/23) ?  ?Goal of Therapy:  ?INR 2-3 ?Monitor platelets by anticoagulation protocol: Yes ?  ?Plan:  ?Warfarin 5 mg x 1 today. ?Daily PT/INR. ? ?Arty Baumgartner, RPh ?01/01/2022,1:36 PM ? ? ?

## 2022-01-01 NOTE — Anesthesia Postprocedure Evaluation (Signed)
Anesthesia Post Note  Patient: VELMA HANNA  Procedure(s) Performed: IRRIGATION AND DEBRIDEMENT LEFT LOWER LEG WOUND (Left: Leg Lower) APPLICATION OF WOUND VAC (Left: Leg Lower)     Patient location during evaluation: PACU Anesthesia Type: General Level of consciousness: awake and alert Pain management: pain level controlled Vital Signs Assessment: post-procedure vital signs reviewed and stable Respiratory status: spontaneous breathing, nonlabored ventilation, respiratory function stable and patient connected to nasal cannula oxygen Cardiovascular status: blood pressure returned to baseline and stable Postop Assessment: no apparent nausea or vomiting Anesthetic complications: no   No notable events documented.  Last Vitals:  Vitals:   01/01/22 0432 01/01/22 0746  BP: 122/75 122/87  Pulse: 80 99  Resp: 19 19  Temp: 36.7 C 36.8 C  SpO2: 97% 97%    Last Pain:  Vitals:   01/01/22 0625  TempSrc:   PainSc: 7                  Eleonor Ocon S

## 2022-01-01 NOTE — Progress Notes (Signed)
Patient ID: [name], male   DOB: December 22, 1955, 66 y.o.   MRN: [id number] ?I appreciate my partner Dr. Sharol Given coming by and consulting on this patient for his complex wound issues.  I have talked to Mr. [name] at the bedside.  He is very comfortable with proceeding with surgery tomorrow by Dr. Sharol Given for new grafting of his left leg wound.  He understands fully why this is recommended and is very motivated to proceed with surgery tomorrow. ?

## 2022-01-02 ENCOUNTER — Encounter (HOSPITAL_COMMUNITY)
Admission: AD | Disposition: A | Discharge status: Home / Self Care | Payer: Self-pay | Source: Home / Self Care | Attending: Orthopaedic Surgery

## 2022-01-02 ENCOUNTER — Encounter: Payer: Self-pay | Admitting: Orthopaedic Surgery

## 2022-01-02 ENCOUNTER — Other Ambulatory Visit: Payer: Self-pay

## 2022-01-02 ENCOUNTER — Encounter (HOSPITAL_COMMUNITY): Payer: Self-pay | Admitting: Orthopaedic Surgery

## 2022-01-02 ENCOUNTER — Inpatient Hospital Stay (HOSPITAL_COMMUNITY): Payer: Medicare HMO | Admitting: Anesthesiology

## 2022-01-02 DIAGNOSIS — S81802A Unspecified open wound, left lower leg, initial encounter: Secondary | ICD-10-CM

## 2022-01-02 DIAGNOSIS — T45515A Adverse effect of anticoagulants, initial encounter: Secondary | ICD-10-CM

## 2022-01-02 DIAGNOSIS — I872 Venous insufficiency (chronic) (peripheral): Secondary | ICD-10-CM

## 2022-01-02 DIAGNOSIS — I509 Heart failure, unspecified: Secondary | ICD-10-CM

## 2022-01-02 DIAGNOSIS — R58 Hemorrhage, not elsewhere classified: Secondary | ICD-10-CM

## 2022-01-02 DIAGNOSIS — D649 Anemia, unspecified: Secondary | ICD-10-CM

## 2022-01-02 DIAGNOSIS — I110 Hypertensive heart disease with heart failure: Secondary | ICD-10-CM

## 2022-01-02 HISTORY — PX: I & D EXTREMITY: SHX5045

## 2022-01-02 LAB — PROTIME-INR
INR: 1.5 — ABNORMAL HIGH (ref 0.8–1.2)
Prothrombin Time: 17.8 seconds — ABNORMAL HIGH (ref 11.4–15.2)

## 2022-01-02 LAB — SURGICAL PCR SCREEN
MRSA, PCR: NEGATIVE
Staphylococcus aureus: NEGATIVE

## 2022-01-02 SURGERY — IRRIGATION AND DEBRIDEMENT EXTREMITY
Anesthesia: General | Laterality: Left

## 2022-01-02 MED ORDER — SODIUM CHLORIDE 0.9 % IV SOLN: INTRAVENOUS | Status: DC

## 2022-01-02 MED ORDER — CEFAZOLIN SODIUM-DEXTROSE 2-4 GM/100ML-% IV SOLN
2.0000 g | Freq: Three times a day (TID) | INTRAVENOUS | Status: DC
Start: 2022-01-02 — End: 2022-01-03
  Administered 2022-01-02 – 2022-01-03 (×2): 2 g via INTRAVENOUS
  Filled 2022-01-02 (×2): qty 100

## 2022-01-02 MED ORDER — MIDAZOLAM HCL 5 MG/5ML IJ SOLN
INTRAMUSCULAR | Status: DC | PRN
  Administered 2022-01-02: 2 mg via INTRAVENOUS

## 2022-01-02 MED ORDER — METOCLOPRAMIDE HCL 5 MG/ML IJ SOLN: 5.0000 mg | Freq: Three times a day (TID) | INTRAMUSCULAR | Status: DC | PRN

## 2022-01-02 MED ORDER — ONDANSETRON HCL 4 MG/2ML IJ SOLN
INTRAMUSCULAR | Status: AC
  Filled 2022-01-02: qty 2

## 2022-01-02 MED ORDER — LIDOCAINE 2% (20 MG/ML) 5 ML SYRINGE
INTRAMUSCULAR | Status: DC | PRN
  Administered 2022-01-02: 100 mg via INTRAVENOUS

## 2022-01-02 MED ORDER — OXYCODONE HCL 5 MG PO TABS: 5.0000 mg | ORAL_TABLET | Freq: Once | ORAL | Status: DC | PRN

## 2022-01-02 MED ORDER — PROPOFOL 10 MG/ML IV BOLUS
INTRAVENOUS | Status: AC
  Filled 2022-01-02: qty 20

## 2022-01-02 MED ORDER — 0.9 % SODIUM CHLORIDE (POUR BTL) OPTIME
TOPICAL | Status: DC | PRN
Start: 2022-01-02 — End: 2022-01-02
  Administered 2022-01-02 (×2): 1000 mL

## 2022-01-02 MED ORDER — POVIDONE-IODINE 10 % EX SWAB
2.0000 "application " | Freq: Once | CUTANEOUS | Status: AC
  Administered 2022-01-02: 2 via TOPICAL

## 2022-01-02 MED ORDER — HYDROCORTISONE 1 % EX CREA
TOPICAL_CREAM | CUTANEOUS | Status: DC | PRN
  Filled 2022-01-02: qty 28

## 2022-01-02 MED ORDER — ONDANSETRON HCL 4 MG/2ML IJ SOLN
4.0000 mg | Freq: Four times a day (QID) | INTRAMUSCULAR | Status: DC | PRN
Start: 2022-01-02 — End: 2022-01-05

## 2022-01-02 MED ORDER — METHOCARBAMOL 500 MG PO TABS
500.0000 mg | ORAL_TABLET | Freq: Four times a day (QID) | ORAL | Status: DC | PRN
  Administered 2022-01-03 – 2022-01-05 (×2): 500 mg via ORAL
  Filled 2022-01-02 (×2): qty 1

## 2022-01-02 MED ORDER — CHLORHEXIDINE GLUCONATE 0.12 % MT SOLN
OROMUCOSAL | Status: AC
  Filled 2022-01-02: qty 15

## 2022-01-02 MED ORDER — METHOCARBAMOL 1000 MG/10ML IJ SOLN
500.0000 mg | Freq: Four times a day (QID) | INTRAVENOUS | Status: DC | PRN
  Filled 2022-01-02: qty 5

## 2022-01-02 MED ORDER — TRANEXAMIC ACID-NACL 1000-0.7 MG/100ML-% IV SOLN
1000.0000 mg | INTRAVENOUS | Status: AC
  Administered 2022-01-02: 1000 mg via INTRAVENOUS
  Filled 2022-01-02: qty 100

## 2022-01-02 MED ORDER — FENTANYL CITRATE (PF) 100 MCG/2ML IJ SOLN
INTRAMUSCULAR | Status: DC | PRN
Start: 2022-01-02 — End: 2022-01-02
  Administered 2022-01-02: 50 ug via INTRAVENOUS
  Administered 2022-01-02: 25 ug via INTRAVENOUS
  Administered 2022-01-02: 50 ug via INTRAVENOUS
  Administered 2022-01-02: 25 ug via INTRAVENOUS

## 2022-01-02 MED ORDER — CHLORHEXIDINE GLUCONATE 0.12 % MT SOLN
15.0000 mL | Freq: Once | OROMUCOSAL | Status: AC
  Administered 2022-01-02: 15 mL via OROMUCOSAL

## 2022-01-02 MED ORDER — CHLORHEXIDINE GLUCONATE 4 % EX LIQD
60.0000 mL | Freq: Once | CUTANEOUS | Status: DC
  Filled 2022-01-02: qty 60

## 2022-01-02 MED ORDER — ACETAMINOPHEN 500 MG PO TABS: 1000.0000 mg | ORAL_TABLET | Freq: Once | ORAL | Status: DC | PRN

## 2022-01-02 MED ORDER — ONDANSETRON HCL 4 MG/2ML IJ SOLN
INTRAMUSCULAR | Status: DC | PRN
  Administered 2022-01-02: 4 mg via INTRAVENOUS

## 2022-01-02 MED ORDER — ONDANSETRON HCL 4 MG PO TABS: 4.0000 mg | ORAL_TABLET | Freq: Four times a day (QID) | ORAL | Status: DC | PRN

## 2022-01-02 MED ORDER — POLYETHYLENE GLYCOL 3350 17 G PO PACK: 17.0000 g | PACK | Freq: Every day | ORAL | Status: DC | PRN

## 2022-01-02 MED ORDER — MIDAZOLAM HCL 2 MG/2ML IJ SOLN
INTRAMUSCULAR | Status: AC
  Filled 2022-01-02: qty 2

## 2022-01-02 MED ORDER — BISACODYL 10 MG RE SUPP: 10.0000 mg | Freq: Every day | RECTAL | Status: DC | PRN

## 2022-01-02 MED ORDER — FENTANYL CITRATE (PF) 100 MCG/2ML IJ SOLN: 25.0000 ug | INTRAMUSCULAR | Status: DC | PRN

## 2022-01-02 MED ORDER — FENTANYL CITRATE (PF) 250 MCG/5ML IJ SOLN
INTRAMUSCULAR | Status: AC
  Filled 2022-01-02: qty 5

## 2022-01-02 MED ORDER — ORAL CARE MOUTH RINSE: 15.0000 mL | Freq: Once | OROMUCOSAL | Status: AC

## 2022-01-02 MED ORDER — PHENYLEPHRINE HCL-NACL 20-0.9 MG/250ML-% IV SOLN
INTRAVENOUS | Status: DC | PRN
  Administered 2022-01-02: 20 ug/min via INTRAVENOUS

## 2022-01-02 MED ORDER — METOCLOPRAMIDE HCL 5 MG PO TABS: 5.0000 mg | ORAL_TABLET | Freq: Three times a day (TID) | ORAL | Status: DC | PRN

## 2022-01-02 MED ORDER — ACETAMINOPHEN 10 MG/ML IV SOLN: 1000.0000 mg | Freq: Once | INTRAVENOUS | Status: DC | PRN

## 2022-01-02 MED ORDER — LIDOCAINE 2% (20 MG/ML) 5 ML SYRINGE
INTRAMUSCULAR | Status: AC
  Filled 2022-01-02: qty 5

## 2022-01-02 MED ORDER — ACETAMINOPHEN 160 MG/5ML PO SOLN: 1000.0000 mg | Freq: Once | ORAL | Status: DC | PRN

## 2022-01-02 MED ORDER — LACTATED RINGERS IV SOLN: INTRAVENOUS | Status: DC

## 2022-01-02 MED ORDER — DEXAMETHASONE SODIUM PHOSPHATE 10 MG/ML IJ SOLN
INTRAMUSCULAR | Status: AC
  Filled 2022-01-02: qty 1

## 2022-01-02 MED ORDER — CEFAZOLIN SODIUM-DEXTROSE 2-4 GM/100ML-% IV SOLN
2.0000 g | INTRAVENOUS | Status: AC
  Filled 2022-01-02: qty 100

## 2022-01-02 MED ORDER — DEXAMETHASONE SODIUM PHOSPHATE 10 MG/ML IJ SOLN
INTRAMUSCULAR | Status: DC | PRN
  Administered 2022-01-02: 10 mg via INTRAVENOUS

## 2022-01-02 MED ORDER — PROPOFOL 10 MG/ML IV BOLUS
INTRAVENOUS | Status: DC | PRN
  Administered 2022-01-02: 160 mg via INTRAVENOUS

## 2022-01-02 MED ORDER — OXYCODONE HCL 5 MG/5ML PO SOLN: 5.0000 mg | Freq: Once | ORAL | Status: DC | PRN

## 2022-01-02 MED ORDER — DOCUSATE SODIUM 100 MG PO CAPS
100.0000 mg | ORAL_CAPSULE | Freq: Two times a day (BID) | ORAL | Status: DC
  Administered 2022-01-02 – 2022-01-05 (×6): 100 mg via ORAL
  Filled 2022-01-02 (×6): qty 1

## 2022-01-02 SURGICAL SUPPLY — 41 items
BAG COUNTER SPONGE SURGICOUNT (BAG) IMPLANT
BLADE SURG 21 STRL SS (BLADE) ×2 IMPLANT
BNDG COHESIVE 4X5 TAN ST LF (GAUZE/BANDAGES/DRESSINGS) ×1 IMPLANT
BNDG COHESIVE 6X5 TAN STRL LF (GAUZE/BANDAGES/DRESSINGS) IMPLANT
BNDG GAUZE ELAST 4 BULKY (GAUZE/BANDAGES/DRESSINGS) ×4 IMPLANT
CANISTER WOUNDNEG PRESSURE 500 (CANNISTER) ×1 IMPLANT
CNTNR URN SCR LID CUP LEK RST (MISCELLANEOUS) IMPLANT
CONT SPEC 4OZ STRL OR WHT (MISCELLANEOUS) ×2
COVER SURGICAL LIGHT HANDLE (MISCELLANEOUS) ×4 IMPLANT
DRAPE DERMATAC (DRAPES) ×1 IMPLANT
DRAPE U-SHAPE 47X51 STRL (DRAPES) ×2 IMPLANT
DRESSING VERAFLO CLEANS CC MED (GAUZE/BANDAGES/DRESSINGS) IMPLANT
DRSG ADAPTIC 3X8 NADH LF (GAUZE/BANDAGES/DRESSINGS) ×2 IMPLANT
DRSG VERAFLO CLEANSE CC MED (GAUZE/BANDAGES/DRESSINGS) ×4
DURAPREP 26ML APPLICATOR (WOUND CARE) ×2 IMPLANT
ELECT REM PT RETURN 9FT ADLT (ELECTROSURGICAL) ×2
ELECTRODE REM PT RTRN 9FT ADLT (ELECTROSURGICAL) IMPLANT
GAUZE SPONGE 4X4 12PLY STRL (GAUZE/BANDAGES/DRESSINGS) ×2 IMPLANT
GLOVE SURG ORTHO LTX SZ9 (GLOVE) ×2 IMPLANT
GLOVE SURG UNDER POLY LF SZ9 (GLOVE) ×2 IMPLANT
GOWN STRL REUS W/ TWL XL LVL3 (GOWN DISPOSABLE) ×2 IMPLANT
GOWN STRL REUS W/TWL XL LVL3 (GOWN DISPOSABLE) ×4
GRAFT SKIN WND OMEGA3 300 (Tissue) ×1 IMPLANT
HANDPIECE INTERPULSE COAX TIP (DISPOSABLE)
KIT BASIN OR (CUSTOM PROCEDURE TRAY) ×2 IMPLANT
KIT TURNOVER KIT B (KITS) ×2 IMPLANT
MANIFOLD NEPTUNE II (INSTRUMENTS) ×2 IMPLANT
NS IRRIG 1000ML POUR BTL (IV SOLUTION) ×2 IMPLANT
PACK ORTHO EXTREMITY (CUSTOM PROCEDURE TRAY) ×2 IMPLANT
PAD ARMBOARD 7.5X6 YLW CONV (MISCELLANEOUS) ×4 IMPLANT
PAD NEG PRESSURE SENSATRAC (MISCELLANEOUS) ×1 IMPLANT
SET HNDPC FAN SPRY TIP SCT (DISPOSABLE) IMPLANT
SPONGE T-LAP 18X18 ~~LOC~~+RFID (SPONGE) ×1 IMPLANT
STAPLER VISISTAT 35W (STAPLE) ×2 IMPLANT
STOCKINETTE IMPERVIOUS 9X36 MD (GAUZE/BANDAGES/DRESSINGS) ×1 IMPLANT
SUT ETHILON 2 0 PSLX (SUTURE) ×2 IMPLANT
SWAB COLLECTION DEVICE MRSA (MISCELLANEOUS) ×1 IMPLANT
SWAB CULTURE ESWAB REG 1ML (MISCELLANEOUS) IMPLANT
TOWEL GREEN STERILE (TOWEL DISPOSABLE) ×2 IMPLANT
TUBE CONNECTING 12X1/4 (SUCTIONS) ×2 IMPLANT
YANKAUER SUCT BULB TIP NO VENT (SUCTIONS) ×2 IMPLANT

## 2022-01-02 NOTE — Progress Notes (Signed)
?   01/02/22 1715  ?Assess: MEWS Score  ?Temp 98.4 ?F (36.9 ?C)  ?BP (!) 134/101  ?Pulse Rate (!) 109  ?ECG Heart Rate (!) 109  ?Level of Consciousness Alert  ?SpO2 98 %  ?O2 Device Room Air  ?Assess: MEWS Score  ?MEWS Temp 0  ?MEWS Systolic 0  ?MEWS Pulse 1  ?MEWS RR 1  ?MEWS LOC 0  ?MEWS Score 2  ?MEWS Score Color Yellow  ?Assess: if the MEWS score is Yellow or Red  ?Were vital signs taken at a resting state? Yes  ?Focused Assessment No change from prior assessment  ?Early Detection of Sepsis Score *See Row Information* Low  ?MEWS guidelines implemented *See Row Information* Yes  ?Treat  ?MEWS Interventions Administered scheduled meds/treatments  ?Pain Scale 0-10  ?Pain Score 7  ?Pain Type Surgical pain  ?Pain Location Leg  ?Pain Orientation Left  ?Pain Descriptors / Indicators Aching  ?Pain Frequency Intermittent  ?Pain Onset Progressive  ?Pain Intervention(s) Medication (See eMAR)  ?Complains of Itching  ?Itching intervention Benadryl  ?Take Vital Signs  ?Increase Vital Sign Frequency  Yellow: Q 2hr X 2 then Q 4hr X 2, if remains yellow, continue Q 4hrs  ?Escalate  ?MEWS: Escalate Yellow: discuss with charge nurse/RN and consider discussing with provider and RRT  ?Notify: Charge Nurse/RN  ?Name of Charge Nurse/RN Notified AJ  ?Date Charge Nurse/RN Notified 01/02/22  ?Time Charge Nurse/RN Notified 1732  ?Document  ?Patient Outcome Other (Comment) ?(BP meds from prior to surgery administered)  ?Progress note created (see row info) Yes  ? ? ?

## 2022-01-02 NOTE — Interval H&P Note (Signed)
History and Physical Interval Note: ? ?01/02/2022 ?7:04 AM ? ?[name]  has presented today for surgery, with the diagnosis of Left Leg Wound.  The various methods of treatment have been discussed with the patient and family. After consideration of risks, benefits and other options for treatment, the patient has consented to  Procedure(s): ?LEFT LEG DEBRIDEMENT AND TISSUE GRAFT (Left) as a surgical intervention.  The patient's history has been reviewed, patient examined, no change in status, stable for surgery.  I have reviewed the patient's chart and labs.  Questions were answered to the patient's satisfaction.   ? ? ?Newt Minion ? ? ?

## 2022-01-02 NOTE — Progress Notes (Signed)
Pharmacy Antibiotic Note ? ?[name] is a 66 y.o. male admitted on 12/30/2021 with  wound infection .  Pharmacy has been consulted for Cefazolin dosing. Last Cefazolin dose given at 1536 PM.  ? ?Plan: ?Continue Cefazolin 2g IV every 8 hours. ?Monitor culture results, clinical status, and renal function.  ? ?Height: 6\' 3"  (190.5 cm) ?Weight: 106.1 kg (234 lb) ?IBW/kg (Calculated) : 84.5 ? ?Temp (24hrs), Avg:98.4 ?F (36.9 ?C), Min:98.1 ?F (36.7 ?C), Max:98.9 ?F (37.2 ?C) ? ?Recent Labs  ?Lab 12/30/21 ?1349  ?WBC 10.1  ?CREATININE 1.19  ?  ?Estimated Creatinine Clearance: 80.4 mL/min (by C-G formula based on SCr of 1.19 mg/dL).   ? ?No Known Allergies ? ?Antimicrobials this admission: ?Cefazolin 2/28 >> ? ?Dose adjustments this admission: ? ? ?Microbiology results: ?3/3 Left Leg wound culture >> ? ?Thank you for allowing pharmacy to be a part of this patient?s care. ? ?Sloan Leiter, PharmD, BCPS, BCCCP ?Clinical Pharmacist ? ?Please refer to [facility] for [place] numbers ?01/02/2022 5:10 PM ? ?

## 2022-01-02 NOTE — Op Note (Signed)
01/02/2022 ? ?4:28 PM ? ?PATIENT:  [name]   ? ?PRE-OPERATIVE DIAGNOSIS:  Left Leg Wound ? ?POST-OPERATIVE DIAGNOSIS:  Same ? ?PROCEDURE:  LEFT LEG DEBRIDEMENT AND TISSUE GRAFT ? ?SURGEON:  Newt Minion, MD ? ?PHYSICIAN ASSISTANT:None ?ANESTHESIA:   General ? ?PREOPERATIVE INDICATIONS:  [name] is a  66 y.o. male with a diagnosis of Left Leg Wound who failed conservative measures and elected for surgical management.   ? ?The risks benefits and alternatives were discussed with the patient preoperatively including but not limited to the risks of infection, bleeding, nerve injury, cardiopulmonary complications, the need for revision surgery, among others, and the patient was willing to proceed. ? ?OPERATIVE IMPLANTS: Kerecis fenestrated skin graft 300 cm?. ? ?@ENCIMAGES @ ? ?OPERATIVE FINDINGS: Patient had healthy granulation tissue however there was a thin fibrinous layer this was sent for cultures.  Continue antibiotics until cultures finalized. ? ?OPERATIVE PROCEDURE: Patient was brought the operating room and underwent a general anesthetic.  After adequate levels anesthesia were obtained patient's left lower extremity was prepped using DuraPrep draped into a sterile field a timeout was called.  A 21 blade knife and Cobb elevator were used to debride the wound.  This debrided back to healthy viable granulation tissue.  The wound was irrigated with normal saline the wound edges were healthy and viable.  A 21 blade knife was used to incise through the granulation tissue and there was no deep abscess.  The 300 cm? Kerecis tissue graft was applied stabilized with staples.  This was covered with a cleanse choice wound VAC sponge x2 and covered with derma tack.  This was connected to suction this had a good suction fit the entire left lower extremity was wrapped with Coban.  Patient was extubated taken the PACU in stable condition. ? ?Debridement type: Excisional Debridement ? ?Side: left ? ?Body Location: leg   ? ?Tools used for debridement: scalpel and curette ? ?Pre-debridement Wound size (cm):   Length: 10        Width: 30     Depth: 1  ? ?Post-debridement Wound size (cm):   Length: 10        Width: 30     Depth: 1  ? ?Debridement depth beyond dead/damaged tissue down to healthy viable tissue: yes ? ?Tissue layer involved: skin, subcutaneous tissue, muscle / fascia ? ?Petra Kuba of tissue removed: [place] and [employer] ? ?Irrigation volume: 2 liters    ? ?Irrigation fluid type: Normal Saline ? ? ? ? ?DISCHARGE PLANNING: ? ?Antibiotic duration: Continue antibiotics until wound cultures finalized obtained today. ? ?Weightbearing: Weightbearing as tolerated ? ?Pain medication: Continue opioid pathway ? ?Dressing care/ Wound VAC: Continue wound VAC for 1 week ? ?Ambulatory devices: Walker or crutches ? ?Discharge to: Anticipate discharge to home. ? ?Follow-up: In the office 1 week post operative. ? ? ? ? ? ? ?  ?

## 2022-01-02 NOTE — Anesthesia Procedure Notes (Signed)
Procedure Name: LMA Insertion ?Date/Time: 01/02/2022 3:34 PM ?Performed by: Genelle Bal, CRNA ?Pre-anesthesia Checklist: Patient identified, Emergency Drugs available, Suction available and Patient being monitored ?Patient Re-evaluated:Patient Re-evaluated prior to induction ?Oxygen Delivery Method: Circle system utilized ?Preoxygenation: Pre-oxygenation with 100% oxygen ?Induction Type: IV induction ?Ventilation: Mask ventilation without difficulty ?LMA: LMA inserted ?LMA Size: 5.0 ?Number of attempts: 1 ?Airway Equipment and Method: Bite block ?Placement Confirmation: positive ETCO2 ?Tube secured with: Tape ?Dental Injury: Teeth and Oropharynx as per pre-operative assessment  ? ? ? ? ?

## 2022-01-02 NOTE — Care Management Important Message (Signed)
Important Message ? ?Patient Details  ?Name: [name] ?MRN: [id number] ?Date of Birth: December 20, 1955 ? ? ?Medicare Important Message Given:  Yes ? ? ? ? ?Levada Dy  Gery Sabedra-Martin ?01/02/2022, 2:14 PM ?

## 2022-01-02 NOTE — Plan of Care (Signed)

## 2022-01-02 NOTE — Anesthesia Preprocedure Evaluation (Addendum)
Anesthesia Evaluation  ?Patient identified by MRN, date of birth, ID band ?Patient awake ? ? ? ?Reviewed: ?Allergy & Precautions, H&P , NPO status , Patient's Chart, lab work & pertinent test results, reviewed documented beta blocker date and time  ? ?History of Anesthesia Complications ?Negative for: history of anesthetic complications ? ?Airway ?Mallampati: II ? ?TM Distance: >3 FB ?Neck ROM: Full ? ? ? Dental ? ?(+) Dental Advisory Given, Chipped,  ?  ?Pulmonary ?neg pulmonary ROS,  ?  ?breath sounds clear to auscultation ? ? ? ? ? ? Cardiovascular ?hypertension, Pt. on medications and Pt. on home beta blockers ?+CHF  ?negative cardio ROS ? ?+ Valvular Problems/Murmurs MR  ?Rhythm:Regular Rate:Normal ? ?S/P MVR ?TEE ??1. Left ventricular ejection fraction, by estimation, is 45 to 50%. The left ventricle has mildly decreased function. The left ventricle has no regional wall motion abnormalities. There is mild concentric left ventricular hypertrophy. Left ventricular  ?diastolic function could not be evaluated. ??2. Right ventricular systolic function is mildly reduced. The right ventricular size is normal. Tricuspid regurgitation signal is inadequate for assessing PA pressure. ??3. Left atrial size was moderately dilated. ??4. Right atrial size was moderately dilated. ??5. The aortic valve is tricuspid. Aortic valve regurgitation is not visualized. No aortic stenosis is present. ?? ?Comparison(s): Prior images reviewed side by side. Changes from prior study are noted. The left ventricular function is worsened. The rhythm is now atrial fibrillation with rapid ventricular response and the transmitral gradient has increased. ?  ?Neuro/Psych ?Seizures -, Well Controlled,   Neuromuscular disease negative psych ROS  ? GI/Hepatic ?Neg liver ROS, GERD  Medicated and Controlled,  ?Endo/Other  ?negative endocrine ROS ? Renal/GU ?negative Renal ROSLab Results ?     Component                 Value               Date                 ?     CREATININE               1.19                12/30/2021           ?  ? ?  ?Musculoskeletal ? ?(+) Arthritis , Osteoarthritis,  Chronic left leg wound  ? Abdominal ?  ?Peds ?negative pediatric ROS ?(+)  Hematology ? ?(+) Blood dyscrasia, anemia , Coumadin therapy ? ?Lab Results ?     Component                Value               Date                 ?     WBC                      10.1                12/30/2021           ?     HGB                      10.9 (L)            12/30/2021           ?     HCT  37.0 (L)            12/30/2021           ?     MCV                      85.1                12/30/2021           ?     PLT                      308                 12/30/2021           ?   ?Anesthesia Other Findings ? ? Reproductive/Obstetrics ? ?  ? ? ? ? ? ? ? ? ? ? ? ? ? ?  ?  ? ? ? ? ? ? ? ?Anesthesia Physical ?Anesthesia Plan ? ?ASA: 3 ? ?Anesthesia Plan: General  ? ?Post-op Pain Management: Ofirmev IV (intra-op)*  ? ?Induction: Intravenous ? ?PONV Risk Score and Plan: 2 and Ondansetron and Dexamethasone ? ?Airway Management Planned: LMA ? ?Additional Equipment: None ? ?Intra-op Plan:  ? ?Post-operative Plan: Extubation in OR ? ?Informed Consent: I have reviewed the patients History and Physical, chart, labs and discussed the procedure including the risks, benefits and alternatives for the proposed anesthesia with the patient or authorized representative who has indicated his/her understanding and acceptance.  ? ? ? ?Dental advisory given ? ?Plan Discussed with: CRNA and Anesthesiologist ? ?Anesthesia Plan Comments:   ? ? ? ? ? ? ?Anesthesia Quick Evaluation ? ?

## 2022-01-02 NOTE — Transfer of Care (Signed)
Immediate Anesthesia Transfer of Care Note ? ?Patient: [name] ? ?Procedure(s) Performed: LEFT LEG DEBRIDEMENT AND TISSUE GRAFT (Left) ? ?Patient Location: PACU ? ?Anesthesia Type:General ? ?Level of Consciousness: awake, alert  and oriented ? ?Airway & Oxygen Therapy: Patient Spontanous Breathing and Patient connected to face mask oxygen ? ?Post-op Assessment: Report given to RN and Post -op Vital signs reviewed and stable ? ?Post vital signs: Reviewed and stable ? ?Last Vitals:  ?Vitals Value Taken Time  ?BP 148/108 01/02/22 1616  ?Temp    ?Pulse 117 01/02/22 1618  ?Resp 13 01/02/22 1618  ?SpO2 95 % 01/02/22 1618  ?Vitals shown include unvalidated device data. ? ?Last Pain:  ?Vitals:  ? 01/02/22 1411  ?TempSrc: Oral  ?PainSc:   ?   ? ?Patients Stated Pain Goal: 3 (12/31/21 2919) ? ?Complications: No notable events documented. ?

## 2022-01-02 NOTE — Progress Notes (Signed)
Wound vac canister changed - total of 566ml in canister - 82ml output from start of shift on 3/3. ?

## 2022-01-02 NOTE — Progress Notes (Signed)
Dr. Ermalene Postin and Dr. Sharol Given at bedside and made aware of pt's SBP and HR prior to surgery. No further orders. Pt verbalizes no distress. Will continue to monitor. ?

## 2022-01-03 LAB — PROTIME-INR
INR: 1.5 — ABNORMAL HIGH (ref 0.8–1.2)
Prothrombin Time: 17.8 seconds — ABNORMAL HIGH (ref 11.4–15.2)

## 2022-01-03 MED ORDER — VANCOMYCIN HCL 2000 MG/400ML IV SOLN
2000.0000 mg | Freq: Once | INTRAVENOUS | Status: AC
  Administered 2022-01-03: 2000 mg via INTRAVENOUS
  Filled 2022-01-03: qty 400

## 2022-01-03 MED ORDER — SODIUM CHLORIDE 0.9 % IV SOLN
2.0000 g | Freq: Three times a day (TID) | INTRAVENOUS | Status: DC
  Administered 2022-01-03 – 2022-01-05 (×7): 2 g via INTRAVENOUS
  Filled 2022-01-03 (×7): qty 2

## 2022-01-03 MED ORDER — BACID PO TABS
2.0000 | ORAL_TABLET | Freq: Three times a day (TID) | ORAL | Status: DC
  Filled 2022-01-03 (×3): qty 2

## 2022-01-03 MED ORDER — WARFARIN SODIUM 7.5 MG PO TABS
7.5000 mg | ORAL_TABLET | Freq: Once | ORAL | Status: AC
  Administered 2022-01-03: 7.5 mg via ORAL
  Filled 2022-01-03: qty 1

## 2022-01-03 MED ORDER — RISAQUAD PO CAPS
2.0000 | ORAL_CAPSULE | Freq: Three times a day (TID) | ORAL | Status: DC
  Administered 2022-01-03 – 2022-01-05 (×7): 2 via ORAL
  Filled 2022-01-03 (×8): qty 2

## 2022-01-03 MED ORDER — VANCOMYCIN HCL 2000 MG/400ML IV SOLN
2000.0000 mg | INTRAVENOUS | Status: DC
  Administered 2022-01-04: 2000 mg via INTRAVENOUS
  Filled 2022-01-03 (×2): qty 400

## 2022-01-03 NOTE — Progress Notes (Signed)
Pharmacy Antibiotic Note ? ?[name] is a 66 y.o. male admitted on 12/30/2021 with  left leg wound s/p debridgement and tissue graft on 3/3 .  Pharmacy has been consulted for vancomycin and cefepime dosing. ? ?Patient has been on cefazolin 2 grams Q8hr since 2/28, last dose 3/4 at 0630. Patient remains afebrile (Tmax 3/3 98.9) but tachycardic and no CBC. Early gram stain from wound cultures yesterday showing GPC and GNR.  ? ?Last BMP 2/28 and creatinine 1.19, which seems around baseline of ~1.2. eCrCl ~80 using Adj BW. Positive UOP. ? ?Vancomycin 2000 mg IV Q 24 hrs. Goal AUC 400-550. ?Expected AUC: 403 ?SCr used: 1.19 ? ?Plan: ?Vancomycin 2000 mg IV x1 for a loading dose then every 24 hours thereafter ?Cefepime 2 grams IV every 8 hours  ?Daily BMP, starting 3/5 ?F/u cultures, clinical status, and de-escalation  ?F/u vanco levels as appropriate  ? ?Height: '6\' 3"'$  (190.5 cm) ?Weight: 106.1 kg (234 lb) ?IBW/kg (Calculated) : 84.5 ? ?Temp (24hrs), Avg:98.3 ?F (36.8 ?C), Min:97.7 ?F (36.5 ?C), Max:98.6 ?F (37 ?C) ? ?Recent Labs  ?Lab 12/30/21 ?1349  ?WBC 10.1  ?CREATININE 1.19  ?  ?Estimated Creatinine Clearance: 80.4 mL/min (by C-G formula based on SCr of 1.19 mg/dL).   ? ?No Known Allergies ? ?Antimicrobials this admission: ?Cefazolin 2/28 >> 3/4 ?Cefepime 3/4 >> ?Vanco 3/4 >> ? ?Dose adjustments this admission: ?N/A ? ?Microbiology results: ?3/3 Superficial wound cx: GPC and GNR ? ?Thank you for allowing pharmacy to be a part of this patientDouglass care. ? ?Eddie Candle, PharmD, BCPS ?Clinical Pharmacist ?01/03/2022 8:18 AM ? ?

## 2022-01-03 NOTE — Anesthesia Postprocedure Evaluation (Signed)
Anesthesia Post Note ? ?Patient: [name] ? ?Procedure(s) Performed: LEFT LEG DEBRIDEMENT AND TISSUE GRAFT (Left) ? ?  ? ?Patient location during evaluation: PACU ?Anesthesia Type: General ?Level of consciousness: awake and alert ?Pain management: pain level controlled ?Vital Signs Assessment: post-procedure vital signs reviewed and stable ?Respiratory status: spontaneous breathing, nonlabored ventilation, respiratory function stable and patient connected to nasal cannula oxygen ?Cardiovascular status: blood pressure returned to baseline and stable ?Postop Assessment: no apparent nausea or vomiting ?Anesthetic complications: no ? ? ?No notable events documented. ? ?Last Vitals:  ?Vitals:  ? 01/03/22 0900 01/03/22 1319  ?BP: (!) 119/93 111/88  ?Pulse: (!) 116 (!) 117  ?Resp: 16 16  ?Temp:  36.5 ?C  ?SpO2: 99% 99%  ?  ?Last Pain:  ?Vitals:  ? 01/03/22 1319  ?TempSrc: Oral  ?PainSc: 0-No pain  ? ? ?  ?  ?  ?  ?  ?  ? ?Donnie Panik ? ? ? ? ?

## 2022-01-03 NOTE — Progress Notes (Signed)
ANTICOAGULATION CONSULT NOTE - Follow Up Consult ? ?Pharmacy Consult for Warfarin ?Indication: atrial fibrillation ? ?No Known Allergies ? ?Patient Measurements: ?Height: '6\' 3"'$  (190.5 cm) ?Weight: 106.1 kg (234 lb) ?IBW/kg (Calculated) : 84.5 ? ?Vital Signs: ?Temp: 98.4 ?F (36.9 ?C) (03/04 0755) ?Temp Source: Oral (03/04 0755) ?BP: 119/93 (03/04 0900) ?Pulse Rate: 116 (03/04 0755) ? ?Labs: ?Recent Labs  ?  01/01/22 ?0248 01/02/22 ?0226 01/03/22 ?0228  ?LABPROT 18.2* 17.8* 17.8*  ?INR 1.5* 1.5* 1.5*  ? ? ? ?Estimated Creatinine Clearance: 80.4 mL/min (by C-G formula based on SCr of 1.19 mg/dL). ? ?Assessment: ?66 y.o. M s/p I&D of chronic L leg wound with wound vac applied and skin graft on 3/3. Pt on warfarin PTA for afib. Admission INR 1.2.  ? ?Warfarin resumed 2/28 post-op I&D.  INR today remains subtherapeutic at 1.5. Hgb trending up at 10.9, plts wnl at 308. Renal function stable. No warfarin given on 3/3.  ? ?Home warfarin regimen: 5 mg daily except, 7.5 mg on Wednsdays (last dose PTA 2/23) ?  ?Goal of Therapy:  ?INR 2-3 ?Monitor platelets by anticoagulation protocol: Yes ?  ?Plan:  ?Warfarin 7.5 mg x 1 today. ?Daily PT/INR. ?F/U CBC, clinical status, s/s bleeding  ? ?Eddie Candle, PharmD, BCPS ?Clinical Pharmacist ?01/03/2022,10:32 AM ? ? ?

## 2022-01-03 NOTE — Evaluation (Signed)
Physical Therapy Evaluation ?Patient Details ?Name: [name] ?MRN: [id number] ?DOB: 1956-09-25 ?Today's Date: 01/03/2022 ? ?History of Present Illness ? 66 y/o male admitted on 12/30/21 following I&D on L lower leg wound with wound vac application. Now s/p repeat I&D in OR on 3/3 for skin grafting. PMH: seizures, HTN, Afib, CHF, s/p Maze operation for AFib ?  ?Clinical Impression ? Pt sitting EOB upon arrival of PT, agreeable to evaluation at this time. 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 He presents today after I&D with pain well controlled and good ability to safely navigate in the room and hallway. He is mobilizing on modI level with RW for stability, but was able to manage balance challenges during gait as well as stairs without issue. The pt was educated on importance of continued mobility, but does not show any acute PT needs. He is safe to mobilize with staff or with supervision once lines/leads removed. The pt is in agreement with this plan, acute PT will sign off at this time.    ? ?Recommendations for follow up therapy are one component of a multi-disciplinary discharge planning process, led by the attending physician.  Recommendations may be updated based on patient status, additional functional criteria and insurance authorization. ? ?Follow Up Recommendations No PT follow up ? ?  ?Assistance Recommended at Discharge PRN  ?Patient can return home with the following ?   ? ?  ?Equipment Recommendations None recommended by PT  ?Recommendations for Other Services ?    ?  ?Functional Status Assessment Patient has not had a recent decline in their functional status  ? ?  ?Precautions / Restrictions Precautions ?Precautions: Fall ?Precaution Comments: wound vac, strict elevation after mobility ?Restrictions ?Weight Bearing Restrictions: No  ? ?  ? ?Mobility ? Bed Mobility ?Overal bed mobility: Independent ?  ?  ?  ?  ?  ?  ?  ?  ? ?Transfers ?Overall transfer level:  Modified independent ?Equipment used: Rolling walker (2 wheels) ?  ?  ?  ?  ?  ?  ?  ?General transfer comment: no assist, pt able to stand without RW to use urinal ?  ? ?Ambulation/Gait ?Ambulation/Gait assistance: Modified independent (Device/Increase time) ?Gait Distance (Feet): 500 Feet ?Assistive device: Rolling walker (2 wheels) ?Gait Pattern/deviations: WFL(Within Functional Limits) ?Gait velocity: WFL ?Gait velocity interpretation: >2.62 ft/sec, indicative of community ambulatory ?  ?General Gait Details: RW for added stability, pt able to mobilize without need for physical assist even with addition of balance challenges ? ?Stairs ?Stairs: Yes ?Stairs assistance: Supervision ?Stair Management: One rail Right ?Number of Stairs: 3 ?General stair comments: sbale, limited by lines only ?  ? ?Balance Overall balance assessment: Mild deficits observed, not formally tested ?  ?  ?  ?  ?  ?  ?  ?  ?  ?  ?  ?  ?  ?  ?  ?  ?  ?  ?   ? ? ? ?Pertinent Vitals/Pain Pain Assessment ?Pain Assessment: Faces ?Faces Pain Scale: Hurts a little bit ?Pain Location: LLE ?Pain Descriptors / Indicators: Discomfort ?Pain Intervention(s): Limited activity within patient's tolerance, Monitored during session, Repositioned, Premedicated before session  ? ? ?Home Living Family/patient expects to be discharged to:: Private residence ?Living Arrangements: Spouse/significant other ?Available Help at Discharge: Family ?Type of Home: House ?Home Access: Stairs to enter ?Entrance Stairs-Rails: Right ?Entrance Stairs-Number of Steps: 5 ?  ?Home Layout: One level ?Home Equipment: BSC/3in1;Rolling  Walker (2 wheels);Tub bench;Wheelchair - manual;Crutches;Cane - single point ?   ?  ?Prior Function Prior Level of Function : Independent/Modified Independent;Working/employed ?  ?  ?  ?  ?  ?  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  ? ? ?Hand Dominance  ? Dominant Hand: Right ? ?  ?Extremity/Trunk Assessment  ?  Upper Extremity Assessment ?Upper Extremity Assessment: Overall WFL for tasks assessed ?  ? ?Lower Extremity Assessment ?Lower Extremity Assessment: Overall WFL for tasks assessed (full sensation and movement in LLE other than at fused ankle (chronic)) ?LLE Deficits / Details: LLE lower leg wound with wound vac. Strength 4+/5 ?LLE Sensation: WNL ?LLE Coordination: WNL ?  ? ?Cervical / Trunk Assessment ?Cervical / Trunk Assessment: Normal  ?Communication  ? Communication: No difficulties  ?Cognition Arousal/Alertness: Awake/alert ?Behavior During Therapy: [facility] for tasks assessed/performed ?Overall Cognitive Status: Within Functional Limits for tasks assessed ?  ?  ?  ?  ?  ?  ?  ?  ?  ?  ?  ?  ?  ?  ?  ?  ?  ?  ?  ? ?  ?General Comments General comments (skin integrity, edema, etc.): VSS on RA ? ?  ?Exercises    ? ?Assessment/Plan  ?  ?PT Assessment Patient does not need any further PT services  ?   ?   ? ?PT Goals (Current goals can be found in the Care Plan section)  ?Acute Rehab PT Goals ?Patient Stated Goal: to hurry up and get out of here this weekend ?PT Goal Formulation: All assessment and education complete, DC therapy ?Time For Goal Achievement: 01/10/22 ?Potential to Achieve Goals: Good ? ?  ? ?AM-PAC PT "6 Clicks" Mobility  ?Outcome Measure Help needed turning from your back to your side while in a flat bed without using bedrails?: None ?Help needed moving from lying on your back to sitting on the side of a flat bed without using bedrails?: None ?Help needed moving to and from a bed to a chair (including a wheelchair)?: None ?Help needed standing up from a chair using your arms (e.g., wheelchair or bedside chair)?: None ?Help needed to walk in hospital room?: None ?Help needed climbing 3-5 steps with a railing? : A Little ?6 Click Score: 23 ? ?  ?End of Session Equipment Utilized During Treatment: Gait belt ?Activity Tolerance: Patient tolerated treatment well ?Patient left: in bed;with call bell/phone  within reach;with bed alarm set ?Nurse Communication: Mobility status ?PT Visit Diagnosis: Muscle weakness (generalized) (M62.81) ?  ? ?Time: 2440-1027 ?PT Time Calculation (min) (ACUTE ONLY): 21 min ? ? ?Charges:   PT Evaluation ?$PT Eval Low Complexity: 1 Low ?  ?  ?   ? ? ?West Carbo, PT, DPT  ? ?Acute Rehabilitation Department ?Pager #: [phone] - [phone] ? ?Sandra Cockayne ?01/03/2022, 1:35 PM ? ?

## 2022-01-03 NOTE — Plan of Care (Signed)
  Problem: Education: Goal: Knowledge of General Education information will improve Description: Including pain rating scale, medication(s)/side effects and non-pharmacologic comfort measures Outcome: Progressing   Problem: Pain Managment: Goal: General experience of comfort will improve Outcome: Progressing   Problem: Safety: Goal: Ability to remain free from injury will improve Outcome: Progressing   

## 2022-01-03 NOTE — Progress Notes (Signed)
Patient ID: [name], male   DOB: 1956/08/27, 66 y.o.   MRN: [id number] ?Patient seen in follow-up postoperative day 1 skin graft left leg.  Interoperative tissue cultures are showing gram-positive cocci and gram-negative rods.  Pharmacy to dose vancomycin and Maksi pending.  Sensitivities are pending.  Discharge planning pending results of the culture sensitivities.  Will add probiotics to his order set and he will need a Prevena for discharge. ?

## 2022-01-04 LAB — BASIC METABOLIC PANEL
Anion gap: 5 (ref 5–15)
BUN: 24 mg/dL — ABNORMAL HIGH (ref 8–23)
CO2: 30 mmol/L (ref 22–32)
Calcium: 8.3 mg/dL — ABNORMAL LOW (ref 8.9–10.3)
Chloride: 102 mmol/L (ref 98–111)
Creatinine, Ser: 1.18 mg/dL (ref 0.61–1.24)
GFR, Estimated: >60 mL/min (ref >60)
Glucose, Bld: 113 mg/dL — ABNORMAL HIGH (ref 70–99)
Potassium: 4 mmol/L (ref 3.5–5.1)
Sodium: 137 mmol/L (ref 135–145)

## 2022-01-04 LAB — CBC
HCT: 33.4 % — ABNORMAL LOW (ref 39.0–52.0)
Hemoglobin: 10.1 g/dL — ABNORMAL LOW (ref 13.0–17.0)
MCH: 25.3 pg — ABNORMAL LOW (ref 26.0–34.0)
MCHC: 30.2 g/dL (ref 30.0–36.0)
MCV: 83.7 fL (ref 80.0–100.0)
Platelets: 305 10*3/uL (ref 150–400)
RBC: 3.99 MIL/uL — ABNORMAL LOW (ref 4.22–5.81)
RDW: 15.7 % — ABNORMAL HIGH (ref 11.5–15.5)
WBC: 8.8 10*3/uL (ref 4.0–10.5)
nRBC: 0 % (ref 0.0–0.2)

## 2022-01-04 LAB — PROTIME-INR
INR: 1.4 — ABNORMAL HIGH (ref 0.8–1.2)
Prothrombin Time: 16.9 seconds — ABNORMAL HIGH (ref 11.4–15.2)

## 2022-01-04 MED ORDER — WARFARIN SODIUM 7.5 MG PO TABS
7.5000 mg | ORAL_TABLET | Freq: Once | ORAL | Status: AC
  Administered 2022-01-04: 7.5 mg via ORAL
  Filled 2022-01-04: qty 1

## 2022-01-04 MED ORDER — BISMUTH SUBSALICYLATE 262 MG/15ML PO SUSP
30.0000 mL | Freq: Once | ORAL | Status: AC
  Administered 2022-01-04: 30 mL via ORAL
  Filled 2022-01-04: qty 236

## 2022-01-04 NOTE — Progress Notes (Addendum)
Patient stable.  He feels very well and states that he has not felt this good in weeks.  Pain improving and he feels the swelling in his operative leg is continually improving.  He denies any fevers, chills, night sweats.  There is sanguinous drainage in the wound VAC canister (about 100 cc at time of evaluation which was 7 AM).  Wound culture is growing gram-positive cocci and gram-negative rods and he was placed on Maxipime and vancomycin yesterday by Dr. Sharol Given.  Plan is continue with IV antibiotics and await disposition based on culture results.  Overall he seems to be progressing well.  Denies any complaint of chest pain or shortness of breath today. ?

## 2022-01-04 NOTE — Progress Notes (Signed)
ANTICOAGULATION CONSULT NOTE - Follow Up Consult ? ?Pharmacy Consult for Warfarin ?Indication: atrial fibrillation ? ?No Known Allergies ? ?Patient Measurements: ?Height: '6\' 3"'$  (190.5 cm) ?Weight: 106.1 kg (234 lb) ?IBW/kg (Calculated) : 84.5 ? ?Vital Signs: ?Temp: 97.5 ?F (36.4 ?C) (03/05 3662) ?Temp Source: Oral (03/05 0731) ?BP: 130/98 (03/05 0731) ?Pulse Rate: 124 (03/05 0731) ? ?Labs: ?Recent Labs  ?  01/02/22 ?0226 01/03/22 ?0228 01/04/22 ?0216  ?HGB  --   --  10.1*  ?HCT  --   --  33.4*  ?PLT  --   --  305  ?LABPROT 17.8* 17.8* 16.9*  ?INR 1.5* 1.5* 1.4*  ?CREATININE  --   --  1.18  ? ? ? ?Estimated Creatinine Clearance: 81.1 mL/min (by C-G formula based on SCr of 1.18 mg/dL). ? ?Assessment: ?66 y.o. M s/p I&D of chronic L leg wound with wound vac applied and skin graft on 3/3. Pt on warfarin PTA for afib. Admission INR 1.2.  ? ?Warfarin resumed 2/28 post-op I&D.  INR today remains subtherapeutic at 1.4. Hgb low but stable, today 10.1. plts wnl at 305. Renal function stable. No warfarin given on 3/3.  ? ?Home warfarin regimen: 5 mg daily except, 7.5 mg on Wednsdays (last dose PTA 2/23) ?  ?Goal of Therapy:  ?INR 2-3 ?Monitor platelets by anticoagulation protocol: Yes ?  ?Plan:  ?Warfarin 7.5 mg x 1 today. ?Daily PT/INR. ?F/U CBC, clinical status, s/s bleeding  ? ?Eddie Candle, PharmD, BCPS ?Clinical Pharmacist ?01/04/2022,9:56 AM ? ? ?

## 2022-01-05 LAB — BASIC METABOLIC PANEL
Anion gap: 8 (ref 5–15)
BUN: 24 mg/dL — ABNORMAL HIGH (ref 8–23)
CO2: 27 mmol/L (ref 22–32)
Calcium: 8.2 mg/dL — ABNORMAL LOW (ref 8.9–10.3)
Chloride: 101 mmol/L (ref 98–111)
Creatinine, Ser: 1.07 mg/dL (ref 0.61–1.24)
GFR, Estimated: >60 mL/min (ref >60)
Glucose, Bld: 92 mg/dL (ref 70–99)
Potassium: 3.7 mmol/L (ref 3.5–5.1)
Sodium: 136 mmol/L (ref 135–145)

## 2022-01-05 LAB — PROTIME-INR
INR: 1.3 — ABNORMAL HIGH (ref 0.8–1.2)
Prothrombin Time: 15.9 seconds — ABNORMAL HIGH (ref 11.4–15.2)

## 2022-01-05 MED ORDER — WARFARIN SODIUM 7.5 MG PO TABS: 7.5000 mg | ORAL_TABLET | Freq: Once | ORAL | Status: DC

## 2022-01-05 MED ORDER — AMOXICILLIN-POT CLAVULANATE 875-125 MG PO TABS: 1.0000 | ORAL_TABLET | Freq: Two times a day (BID) | ORAL | 0 refills | Status: DC

## 2022-01-05 NOTE — Care Management Important Message (Signed)
Important Message ? ?Patient Details  ?Name: [name] ?MRN: [id number] ?Date of Birth: 21-Sep-1956 ? ? ?Medicare Important Message Given:  Yes ? ? ? ? ?Levada Dy  Olusegun Gerstenberger-Martin ?01/05/2022, 12:46 PM ?

## 2022-01-05 NOTE — Progress Notes (Signed)
ANTICOAGULATION CONSULT NOTE - Follow Up Consult ? ?Pharmacy Consult for Warfarin ?Indication: atrial fibrillation ? ?No Known Allergies ? ?Patient Measurements: ?Height: '6\' 3"'$  (190.5 cm) ?Weight: 106.1 kg (234 lb) ?IBW/kg (Calculated) : 84.5 ? ?Vital Signs: ?Temp: 98 ?F (36.7 ?C) (03/06 0857) ?Temp Source: Oral (03/06 0857) ?BP: 126/90 (03/06 0857) ?Pulse Rate: 100 (03/06 0857) ? ?Labs: ?Recent Labs  ?  01/03/22 ?0228 01/04/22 ?0216 01/05/22 ?0157  ?HGB  --  10.1*  --   ?HCT  --  33.4*  --   ?PLT  --  305  --   ?LABPROT 17.8* 16.9* 15.9*  ?INR 1.5* 1.4* 1.3*  ?CREATININE  --  1.18 1.07  ? ? ? ?Estimated Creatinine Clearance: 89.4 mL/min (by C-G formula based on SCr of 1.07 mg/dL). ? ?Assessment: ?66 y.o. M s/p I&D of chronic L leg wound with wound vac applied and skin graft on 3/3. Pt on warfarin PTA for afib. Admission INR 1.2.  ? ?Warfarin resumed 2/28 post-op I&D.  INR today remains subtherapeutic at 1.3.  Noted plans for discharge home 01/05/2022.  ? ?Home warfarin regimen: 5 mg daily except, 7.5 mg on Wednsdays (last dose PTA 2/23) ?  ?Goal of Therapy:  ?INR 2-3 ?Monitor platelets by anticoagulation protocol: Yes ?  ?Plan:  ?Warfarin 7.5 mg x 1 today. ?Daily PT/INR. ?F/U CBC, clinical status, s/s bleeding  ? ?[employer], Pharm.D., BCPS ?Clinical Pharmacist ?Clinical phone for 01/05/2022 from 7:30-3:00 is [phone]. ? ?**Pharmacist phone directory can be found on [email or web address] listed under [place]. ? ?01/05/2022 1:23 PM ? ?

## 2022-01-05 NOTE — Progress Notes (Signed)
Patient ID: [name], male   DOB: 01-24-1956, 66 y.o.   MRN: [id number] ?Patient's wound VAC tube was pulled out last night.  He will need a new wound VAC track pad tube installed prior to discharge.  He will also need a Praveena plus wound VAC pump for discharge.  Plan for discharge today on Augmentin.  Cultures positive for strep C. ?

## 2022-01-05 NOTE — Progress Notes (Signed)
Patient [name] reviewed. Waiting on dressing change for d/c. Patient states wound vac not working and pulls suction piece off. Patient educated not to pull on tubing. Patient states he needs to get dress anyway. Provided education on connecting and disconnecting tubing, suction and pump alarms and how to avoid compromising dressing in the future.  ?

## 2022-01-05 NOTE — Consult Note (Signed)
WOC Nurse Consult Note: ?Called by DD for assistance with vac machine and dressing. The vac is a Prevena placed by Dr. Sharol Given but the patient has been discharged but the bedside RN did not know how to fix it. Home Prevena machine was in the room. The trac pad was ripped loose from the dressing. Reinforced the blue foam with drape, placed a new trac pad, connected canister to the machine, trac pad to the portable Prevena, turned on and immediate suction achieved. Wrapped the leg with Coban. Placed the charger and Prevena paperwork in the plastic bag that came with the Prevena machine and gave to the patient with extra drape and trac pad. Told patient to plug the machine up when he gets home. Notified the NS that the patient is ready to go.  ? ?Douglas Marseilles [name], MSN, RN, CMSRN, AGCNS, WTA ?Wound Treatment Associate ?Pager [phone]  ? ?  ?

## 2022-01-05 NOTE — Discharge Summary (Signed)
Discharge Diagnoses:  Principal Problem:   Leg wound, left, subsequent encounter Active Problems:   Venous stasis dermatitis of both lower extremities   Bleeding on Coumadin   Surgeries: Procedure(s): LEFT LEG DEBRIDEMENT AND TISSUE GRAFT on 01/02/2022    Consultants: Treatment Team:  Newt Minion, MD  Discharged Condition: Improved  Hospital Course: [name] is an 66 y.o. male who was admitted 12/30/2021 with a chief complaint of left leg wound, with a final diagnosis of Left Leg Wound.  Patient was brought to the operating room on 01/02/2022 and underwent Procedure(s): LEFT LEG DEBRIDEMENT AND TISSUE GRAFT.    Patient was given perioperative antibiotics:  Anti-infectives (From admission, onward)    Start     Dose/Rate Route Frequency Ordered Stop   01/05/22 0000  amoxicillin-clavulanate (AUGMENTIN) 875-125 MG tablet        1 tablet Oral 2 times daily 01/05/22 0735 02/14/22 2359   01/04/22 1200  vancomycin (VANCOREADY) IVPB 2000 mg/400 mL        2,000 mg 200 mL/hr over 120 Minutes Intravenous Every 24 hours 01/03/22 1028     01/03/22 0930  vancomycin (VANCOREADY) IVPB 2000 mg/400 mL        2,000 mg 200 mL/hr over 120 Minutes Intravenous  Once 01/03/22 0840 01/03/22 1251   01/03/22 0930  ceFEPIme (MAXIPIME) 2 g in sodium chloride 0.9 % 100 mL IVPB        2 g 200 mL/hr over 30 Minutes Intravenous Every 8 hours 01/03/22 0840     01/02/22 2300  ceFAZolin (ANCEF) IVPB 2g/100 mL premix  Status:  Discontinued        2 g 200 mL/hr over 30 Minutes Intravenous Every 8 hours 01/02/22 1736 01/03/22 0840   01/02/22 1345  ceFAZolin (ANCEF) IVPB 2g/100 mL premix        2 g 200 mL/hr over 30 Minutes Intravenous To ShortStay Surgical 01/02/22 0735 01/02/22 1430   12/31/21 0600  ceFAZolin (ANCEF) IVPB 2g/100 mL premix        2 g 200 mL/hr over 30 Minutes Intravenous On call to O.R. 12/30/21 1317 12/30/21 1727   12/30/21 2200  ceFAZolin (ANCEF) IVPB 2g/100 mL premix        2 g 200 mL/hr  over 30 Minutes Intravenous Every 8 hours 12/30/21 1837 01/02/22 1606     .  Patient was given sequential compression devices, early ambulation, and aspirin for DVT prophylaxis.  Recent vital signs: Patient Vitals for the past 24 hrs:  BP Temp Temp src Pulse Resp  01/05/22 0857 126/90 98 F (36.7 C) Oral 100 18  01/04/22 2100 -- 98.2 F (36.8 C) Oral -- --  01/04/22 2049 130/80 97.7 F (36.5 C) Oral (!) 116 18  .  Recent laboratory studies: No results found.  Discharge Medications:   Allergies as of 01/05/2022   No Known Allergies      Medication List     STOP taking these medications    methocarbamol 500 MG tablet Commonly known as: Robaxin   oxyCODONE 5 MG immediate release tablet Commonly known as: Oxy IR/ROXICODONE   pregabalin 75 MG capsule Commonly known as: Lyrica   tiZANidine 4 MG tablet Commonly known as: Zanaflex       TAKE these medications    ALPRAZolam 0.5 MG tablet Commonly known as: XANAX Take 0.5 mg by mouth 2 (two) times daily.   amoxicillin-clavulanate 875-125 MG tablet Commonly known as: Augmentin Take 1 tablet by mouth 2 (two) times  daily.   aspirin 81 MG EC tablet Take 1 tablet (81 mg total) by mouth daily.   atorvastatin 20 MG tablet Commonly known as: LIPITOR Take 20 mg by mouth daily.   docusate sodium 100 MG capsule Commonly known as: COLACE Take 300 mg by mouth daily.   HYDROcodone-acetaminophen 10-325 MG tablet Commonly known as: NORCO Take 1 tablet by mouth 2 (two) times daily as needed for severe pain.   losartan 50 MG tablet Commonly known as: COZAAR Take 1 tablet by mouth once daily   Melatonin 10 MG Tabs Take 10 mg by mouth at bedtime.   metoprolol tartrate 25 MG tablet Commonly known as: LOPRESSOR Take 1/2 (one-half) tablet by mouth twice daily What changed: See the new instructions.   multivitamin with minerals tablet Take 1 tablet by mouth daily.   multivitamin with minerals Tabs tablet Take 1  tablet by mouth daily.   potassium chloride 10 MEQ tablet Commonly known as: KLOR-CON M Take 1 tablet (10 mEq total) by mouth 2 (two) times daily. **Patient need a appointment for future refill**   spironolactone 25 MG tablet Commonly known as: ALDACTONE Take 1 tablet by mouth once daily   torsemide 20 MG tablet Commonly known as: Demadex Take 1 tablet (20 mg total) by mouth daily.   warfarin 5 MG tablet Commonly known as: COUMADIN Take as directed. If you are unsure how to take this medication, talk to your nurse or doctor. Original instructions: Take 1-1.5 tablets (5-7.5 mg total) by mouth See admin instructions. Takes 5 mg daily except Wednesday. Takes 7.5 mg once daily on Wednesday               Discharge Care Instructions  (From admission, onward)           Start     Ordered   01/05/22 0000  Weight bearing as tolerated       Question Answer Comment  Laterality left   Extremity Lower      01/05/22 0737            Diagnostic Studies: No results found.  Patient benefited maximally from their hospital stay and there were no complications.     Disposition: Discharge disposition: 01-Home or Self Care      Discharge Instructions     Call MD / Call 911   Complete by: As directed    If you experience chest pain or shortness of breath, CALL 911 and be transported to the hospital emergency room.  If you develope a fever above 101 F, pus (white drainage) or increased drainage or redness at the wound, or calf pain, call your surgeon's office.   Constipation Prevention   Complete by: As directed    Drink plenty of fluids.  Prune juice may be helpful.  You may use a stool softener, such as Colace (over the counter) 100 mg twice a day.  Use MiraLax (over the counter) for constipation as needed.   Diet - low sodium heart healthy   Complete by: As directed    Increase activity slowly as tolerated   Complete by: As directed    Negative Pressure Wound Therapy -  Incisional   Complete by: As directed    Patient will need a new track pad attached to the wound VAC dressing.  He will need a Praveena plus wound VAC pump for discharge.  He will need the leg wrapped with Coban from the metatarsal heads to the tibial tubercle.   Post-operative opioid  taper instructions:   Complete by: As directed    POST-OPERATIVE OPIOID TAPER INSTRUCTIONS: It is important to wean off of your opioid medication as soon as possible. If you do not need pain medication after your surgery it is ok to stop day one. Opioids include: Codeine, Hydrocodone(Norco, Vicodin), Oxycodone(Percocet, oxycontin) and hydromorphone amongst others.  Long term and even short term use of opiods can cause: Increased pain response Dependence Constipation Depression Respiratory depression And more.  Withdrawal symptoms can include Flu like symptoms Nausea, vomiting And more Techniques to manage these symptoms Hydrate well Eat regular healthy meals Stay active Use relaxation techniques(deep breathing, meditating, yoga) Do Not substitute Alcohol to help with tapering If you have been on opioids for less than two weeks and do not have pain than it is ok to stop all together.  Plan to wean off of opioids This plan should start within one week post op of your joint replacement. Maintain the same interval or time between taking each dose and first decrease the dose.  Cut the total daily intake of opioids by one tablet each day Next start to increase the time between doses. The last dose that should be eliminated is the evening dose.      Weight bearing as tolerated   Complete by: As directed    Laterality: left   Extremity: Lower       Follow-up Information     Newt Minion, MD Follow up in 1 week(s).   Specialty: Orthopedic Surgery Contact information: [place] [address] [phone]         [name], MD Follow up.   Specialty: Family  Medicine Contact information: [address] [place] [address] [phone]         Lorretta Harp, MD .   Specialties: Cardiology, Radiology Contact information: [address] [place] [address] [phone]                  Signed: Newt Minion 01/05/2022, 4:00 PM

## 2022-01-06 ENCOUNTER — Encounter: Payer: Self-pay | Admitting: Orthopedic Surgery

## 2022-01-06 ENCOUNTER — Ambulatory Visit (INDEPENDENT_AMBULATORY_CARE_PROVIDER_SITE_OTHER): Payer: Medicare HMO | Admitting: Orthopedic Surgery

## 2022-01-06 ENCOUNTER — Telehealth: Payer: Self-pay | Admitting: Orthopedic Surgery

## 2022-01-06 DIAGNOSIS — S81802D Unspecified open wound, left lower leg, subsequent encounter: Secondary | ICD-10-CM

## 2022-01-06 NOTE — Progress Notes (Signed)
Office Visit Note   Patient: TIMOHTY Ewing           Date of Birth: 07/10/1956           MRN: [id number] Visit Date: 01/06/2022              Requested by: [name], MD [place],  [place] [id number] PCP: [name], MD  Chief Complaint  Patient presents with   Left Leg - Routine Post Op    01/02/2022 left leg debridement and Kerecis graft       HPI: Patient is a 66 year old gentleman who was discharged from the hospital yesterday with a portable Praveena wound VAC pump.  Patient states that the tube pulled out and he presents at this time with a detached wound VAC tube.  Patient has a installation wound VAC tube that has 2 tubes and this was placed by nursing prior to discharge.  Assessment & Plan: Visit Diagnoses:  1. Leg wound, left, subsequent encounter     Plan: A new wound VAC tube was applied this had a good suction fit the dressing was overwrapped with Coban.  Patient was also given a new canister.  Follow-Up Instructions: Return in about 1 week (around 01/13/2022).   Ortho Exam  Patient is alert, oriented, no adenopathy, well-dressed, normal affect, normal respiratory effort. Examination patient has the 2 lm to that was placed by nursing prior to discharge.  This was removed and a single-lumen tube was applied this was secured with Ioban overwrapped with Coban.  This had a good suction fit patient was given extra canister plan to follow-up in 1 week.  Imaging: No results found. No images are attached to the encounter.  Labs: Lab Results  Component Value Date   HGBA1C 5.2 03/16/2019   REPTSTATUS PENDING 01/02/2022   REPTSTATUS PENDING 01/02/2022   GRAMSTAIN  01/02/2022    FEW WBC PRESENT,BOTH PMN AND MONONUCLEAR RARE GRAM POSITIVE COCCI RARE GRAM NEGATIVE RODS    GRAMSTAIN  01/02/2022    FEW WBC PRESENT,BOTH PMN AND MONONUCLEAR NO ORGANISMS SEEN Performed at [facility], [place] [address] [place], [place] [address]     CULT  01/02/2022    FEW STREPTOCOCCUS GROUP C Beta hemolytic streptococci are predictably susceptible to penicillin and other beta lactams. Susceptibility testing not routinely performed. ABUNDANT KLEBSIELLA OXYTOCA ABUNDANT STENOTROPHOMONAS MALTOPHILIA SUSCEPTIBILITIES TO FOLLOW Performed at [facility], [place] [address], [place],  [address]    CULT  01/02/2022    NO ANAEROBES ISOLATED; CULTURE IN PROGRESS FOR 5 DAYS     Lab Results  Component Value Date   ALBUMIN 4.1 09/22/2021   ALBUMIN 3.5 03/16/2019   ALBUMIN 2.9 (L) 01/03/2019    Lab Results  Component Value Date   MG 2.1 06/19/2019   MG 2.2 03/25/2019   MG 2.8 (H) 03/22/2019   No results found for: VD25OH  No results found for: PREALBUMIN CBC EXTENDED Latest Ref Rng & Units 01/04/2022 12/30/2021 09/26/2021  WBC 4.0 - 10.5 K/uL 8.8 10.1 8.5  RBC 4.22 - 5.81 MIL/uL 3.99(L) 4.35 3.10(L)  HGB 13.0 - 17.0 g/dL 10.1(L) 10.9(L) 9.6(L)  HCT 39.0 - 52.0 % 33.4(L) 37.0(L) 29.2(L)  PLT 150 - 400 K/uL 305 308 184  NEUTROABS 1.7 - 7.7 K/uL - - -  LYMPHSABS 0.7 - 4.0 K/uL - - -     There is no height or weight on file to calculate BMI.  Orders:  No orders of the defined types were placed in this encounter.  No orders of the defined types were placed in this encounter.    Procedures: No procedures performed  Clinical Data: No additional findings.  ROS:  All other systems negative, except as noted in the HPI. Review of Systems  Objective: Vital Signs: There were no vitals taken for this visit.  Specialty Comments:  No specialty comments available.  PMFS History: Patient Active Problem List   Diagnosis Date Noted   Venous stasis dermatitis of both lower extremities    Bleeding on Coumadin    Leg wound, left, subsequent encounter 12/30/2021   Compartment syndrome of left lower extremity ([place]) 09/22/2021   Status post surgery 09/22/2021   Compartment syndrome of left upper extremity ([place])  09/22/2021   Hyperlipidemia 12/13/2020   Atrial fibrillation ([place]) 06/10/2020   Long term (current) use of anticoagulants 06/10/2020   Secondary hypercoagulable state ([place]) 04/24/2020   AV junctional bradycardia 03/22/2019   S/P minimally invasive mitral valve repair  03/21/2019   S/P Maze operation for atrial fibrillation 03/21/2019   Dilated aortic root ([place]) 01/04/2019   Dilated cardiomyopathy ([place]) 01/04/2019   Atrial flutter with rapid ventricular response ([place])    Mitral valve prolapse    Hypertensive urgency 12/30/2018   Acute on chronic systolic (congestive) heart failure ([facility]) 24/82/5003   Acute systolic heart failure ([place]) 70/48/8891   Chronic systolic (congestive) heart failure ([facility])    Persistent atrial fibrillation ([place]) 12/02/2018   Right hamstring muscle strain 07/20/2018   Essential hypertension 02/07/2016   Severe mitral regurgitation    Annual physical exam 11/16/2014   Arthritis of left knee 10/23/2014   Status post total left knee replacement 10/23/2014   Arthritis of knee, right 01/19/2014   Status post total knee replacement 01/19/2014   Benign paroxysmal positional vertigo 10/30/2013   Testicular hypofunction 03/24/2012   Past Medical History:  Diagnosis Date   Acute on chronic systolic (congestive) heart failure ([place]) 12/30/2018   Allergy    Arthritis    Atrial flutter with rapid ventricular response ([facility])    Chronic systolic (congestive) heart failure ([place])    COVID 10/2021   mild   Dilated aortic root ([place]) 01/04/2019   Dilated cardiomyopathy ([place]) 01/04/2019   GERD (gastroesophageal reflux disease)    history of   Heart murmur    History of colon polyps    Hypertension    Insomnia    Mitral valve prolapse    Mitral valve regurgitation    Persistent atrial fibrillation ([place]) 12/02/2018   S/P Maze operation for atrial fibrillation 03/21/2019   Complete bilateral atrial lesion set using cryothermy and bipolar radiofrequency ablation with  clipping of LA appendage via right mini thoracotomy approach   S/P minimally invasive mitral valve repair 03/21/2019   Complex valvuloplasty including triangular resection of posterior leaflet, artificial Gore-tex neochord placement x4 and 36 mm Sorin Memo 4D ring annuloplasty via right mini thoracotomy approach   Seizures ([place])    had one approx. 30 yrs ago,has not had any since   Severe mitral regurgitation     Family History  Problem Relation Age of Onset   Hypertension Father    Colon cancer Father        dx in his late 16's   Diabetes Mother    Stomach cancer Neg Hx    Esophageal cancer Neg Hx    Pancreatic cancer Neg Hx    Rectal cancer Neg Hx  Past Surgical History:  Procedure Laterality Date   ANKLE FUSION  left   Dec. 9983   APPLICATION OF WOUND VAC Left 09/22/2021   Procedure: APPLICATION OF WOUND VAC;  Surgeon: Mcarthur Rossetti, MD;  Location: [facility];  Service: Orthopedics;  Laterality: Left;   APPLICATION OF WOUND VAC Left 12/30/2021   Procedure: APPLICATION OF WOUND VAC;  Surgeon: Mcarthur Rossetti, MD;  Location: [facility];  Service: Orthopedics;  Laterality: Left;   ARTHROSCOPY KNEE W/ DRILLING  bilateral   2012   CARDIAC VALVE REPLACEMENT     CARDIOVERSION N/A 01/03/2019   Procedure: CARDIOVERSION;  Surgeon: Sueanne Margarita, MD;  Location: [facility];  Service: Cardiovascular;  Laterality: N/A;   CARDIOVERSION N/A 06/23/2019   Procedure: CARDIOVERSION;  Surgeon: Fay Records, MD;  Location: [facility];  Service: Cardiovascular;  Laterality: N/A;   CLIPPING OF ATRIAL APPENDAGE  03/21/2019   Procedure: Clipping Of Atrial Appendage using 49m Atricure Pro2 Clip;  Surgeon: ORexene Alberts MD;  [facility]  Service: Open Heart Surgery;;   COLONOSCOPY     x2   FOOT ARTHRODESIS  06/23/2012   Procedure: ARTHRODESIS FOOT;  Surgeon: JWylene Simmer MD;  [facility]  Service: Orthopedics;  Laterality: Left;  Left Subtalar and Talonavicular Joint Revision  Arthrodesis  Aspiration of Bone Marrow from Left Hip    HAIR TRANSPLANT     HARDWARE REMOVAL  06/23/2012   Procedure: HARDWARE REMOVAL;  Surgeon: JWylene Simmer MD;  [facility]  Service: Orthopedics;  Laterality: Left;  Removal of Deep Implant  X's 3   I & D EXTREMITY Left 09/23/2021   Procedure: IRRIGATION AND DEBRIDEMENT OF LEG;  Surgeon: BMcarthur Rossetti MD;  [facility]  Service: Orthopedics;  Laterality: Left;   I & D EXTREMITY Left 09/26/2021   Procedure: REPEAT IRRIGATION AND DEBRIDEMENT LEFT LEG, POSSIBLE WOUND CLOSURE, POSSIBLE VAC CHANGE, POSSIBLE SKIN GRAFT;  Surgeon: BMcarthur Rossetti MD;  [facility]  Service: Orthopedics;  Laterality: Left;   I & D EXTREMITY Left 12/30/2021   Procedure: IRRIGATION AND DEBRIDEMENT LEFT LOWER LEG WOUND;  Surgeon: BMcarthur Rossetti MD;  [facility]  Service: Orthopedics;  Laterality: Left;   INCISION AND DRAINAGE WOUND WITH FASCIOTOMY Left 09/22/2021   Procedure: INCISION AND DRAINAGE WOUND WITH FASCIOTOMY;  Surgeon: BMcarthur Rossetti MD;  [facility]  Service: Orthopedics;  Laterality: Left;   INSERTION OF MESH N/A 07/10/2016   Procedure: INSERTION OF MESH;  Surgeon: DCoralie Keens MD;  [facility]  Service: General;  Laterality: N/A;   JOINT REPLACEMENT     right hip  01-2011   LEFT HEART CATH AND CORONARY ANGIOGRAPHY N/A 03/16/2019   Procedure: LEFT HEART CATH AND CORONARY ANGIOGRAPHY;  Surgeon: CSherren Mocha MD;  [facility];  Service: Cardiovascular;  Laterality: N/A;   LIMB SPARING RESECTION HIP W/ SADDLE JOINT REPLACEMENT Right    MINIMALLY INVASIVE MAZE PROCEDURE N/A 03/21/2019   Procedure: MINIMALLY INVASIVE MAZE PROCEDURE;  Surgeon: ORexene Alberts MD;  [facility]  Service: Open Heart Surgery;  Laterality: N/A;   MITRAL VALVE REPAIR Right 03/21/2019   Procedure: MINIMALLY INVASIVE MITRAL VALVE REPAIR (MVR) using Memo 4D ring size 36;   Surgeon: ORexene Alberts MD;  [facility]  Service: Open Heart Surgery;  Laterality: Right;   TEE WITHOUT CARDIOVERSION N/A 01/03/2019   Procedure: TRANSESOPHAGEAL ECHOCARDIOGRAM (TEE);  Surgeon: TSueanne Margarita MD;  [facility];  Service: Cardiovascular;  Laterality: N/A;   TEE WITHOUT CARDIOVERSION N/A 03/21/2019   Procedure: TRANSESOPHAGEAL ECHOCARDIOGRAM (TEE);  Surgeon: Rexene Alberts, MD;  Location: [facility];  Service: Open Heart Surgery;  Laterality: N/A;   TEMPORARY PACEMAKER N/A 03/22/2019   Procedure: TEMPORARY PACEMAKER;  Surgeon: Belva Crome, MD;  Location: [facility];  Service: Cardiovascular;  Laterality: N/A;   TOTAL KNEE ARTHROPLASTY Right 01/19/2014   Procedure: RIGHT TOTAL KNEE ARTHROPLASTY, Steroid injection left knee;  Surgeon: Mcarthur Rossetti, MD;  [facility];  Service: Orthopedics;  Laterality: Right;   TOTAL KNEE ARTHROPLASTY Left 10/23/2014   Procedure: LEFT TOTAL KNEE ARTHROPLASTY;  Surgeon: Mcarthur Rossetti, MD;  [facility];  Service: Orthopedics;  Laterality: Left;   UMBILICAL HERNIA REPAIR N/A 07/10/2016   Procedure: UMBILICAL HERNIA REPAIR;  Surgeon: Coralie Keens, MD;  Location: [facility];  Service: General;  Laterality: N/A;   Social History   Occupational History   Occupation: Pension scheme manager  Tobacco Use   Smoking status: Never   Smokeless tobacco: Never  Vaping Use   Vaping Use: Never used  Substance and Sexual Activity   Alcohol use: Yes    Alcohol/week: 2.0 - 4.0 standard drinks    Types: 1 - 2 Glasses of wine, 1 - 2 Standard drinks or equivalent per week    Comment: 2 ounces of alcohol a day   Drug use: No   Sexual activity: Yes

## 2022-01-06 NOTE — Telephone Encounter (Signed)
I sent to triage but pt called and states wound vac came out last night. They had given him an extra one but they got it in wrong so now it leaking out everywhere and he needs someone to call him.  ? ?[phone] ?

## 2022-01-06 NOTE — Telephone Encounter (Signed)
Pt was evaluated in the office today.  ?

## 2022-01-07 ENCOUNTER — Other Ambulatory Visit: Payer: Self-pay | Admitting: Family

## 2022-01-07 ENCOUNTER — Telehealth: Payer: Self-pay

## 2022-01-07 ENCOUNTER — Telehealth: Payer: Self-pay | Admitting: Orthopedic Surgery

## 2022-01-07 LAB — AEROBIC CULTURE W GRAM STAIN (SUPERFICIAL SPECIMEN)

## 2022-01-07 MED ORDER — SULFAMETHOXAZOLE-TRIMETHOPRIM 800-160 MG PO TABS: 1.0000 | ORAL_TABLET | Freq: Two times a day (BID) | ORAL | 0 refills | Status: DC

## 2022-01-07 NOTE — Telephone Encounter (Signed)
Pt called and states that the container on the wound vac is full and he needs some more containers. He states that he is using 2 a day.  ? ?[phone]  ?

## 2022-01-07 NOTE — Telephone Encounter (Signed)
I spoke to the patient who is starting Bactrim today and advised him to HOLD Coumadin today and Thursday and take only 0.5 tablet Friday, Saturday and Sunday.  I will see him 3/13 for INR check. ?

## 2022-01-07 NOTE — Telephone Encounter (Signed)
Pt will come in this morning for additional containers.  ?

## 2022-01-08 ENCOUNTER — Encounter (HOSPITAL_COMMUNITY): Payer: Self-pay | Admitting: Orthopedic Surgery

## 2022-01-08 LAB — ANAEROBIC CULTURE W GRAM STAIN

## 2022-01-09 ENCOUNTER — Other Ambulatory Visit: Payer: Self-pay

## 2022-01-09 ENCOUNTER — Ambulatory Visit (INDEPENDENT_AMBULATORY_CARE_PROVIDER_SITE_OTHER): Payer: Medicare HMO | Admitting: Family

## 2022-01-09 ENCOUNTER — Encounter: Payer: Self-pay | Admitting: Family

## 2022-01-09 ENCOUNTER — Telehealth: Payer: Self-pay

## 2022-01-09 DIAGNOSIS — S81802D Unspecified open wound, left lower leg, subsequent encounter: Secondary | ICD-10-CM

## 2022-01-09 NOTE — Telephone Encounter (Signed)
Patient states he is on his last wound vac cartridge and he usually goes through 2 a day. Wanting to pick up several to get him through the weekend? ?[phone] ?

## 2022-01-09 NOTE — Progress Notes (Signed)
Post-Op Visit Note   Patient: [name]           Date of Birth: 03-Oct-1956           MRN: [id number] Visit Date: 01/09/2022 PCP: [name], MD  Chief Complaint:  Chief Complaint  Patient presents with   Left Leg - Routine Post Op    01/02/2022 left leg debridement and Kerecis graft  Wound vac removed today, canister was full.    HPI:  HPI The patient is a 66 year old gentleman who is seen in postoperative follow-up he continues to have significant drainage from his left lower leg ulcer.  He had debridement with skin grafting on March 3.  He has now gone through 9 of the Prevena VAC canisters.  His antibiotics were changed to Bactrim on Wednesday he continues to complain of a foul odor.  His surgical dressing was removed today  He has been full weightbearing he is at work with his feet dependent for the majority of the day  Ortho Exam On examination of the left lower extremity he has excellent incorporation of the graft staples are in place there is moderate bloody drainage from his graft there is no purulence no surrounding erythema he does have dermatitis from scratching above the area of his dressing.  There is edema to the left lower extremity  Visit Diagnoses: No diagnosis found.  Plan: The patient is aware of instructions to elevate and rest.  He will begin daily dry dressing changes this was done in the office.  Patient will changes as needed soiled we will cleanse this daily with Dial soap discussed not scrubbing the graft itself  Follow-Up Instructions: No follow-ups on file.   Imaging: No results found.  Orders:  No orders of the defined types were placed in this encounter.  No orders of the defined types were placed in this encounter.    PMFS History: Patient Active Problem List   Diagnosis Date Noted   Venous stasis dermatitis of both lower extremities    Bleeding on Coumadin    Leg wound, left, subsequent encounter 12/30/2021   Compartment  syndrome of left lower extremity ([place]) 09/22/2021   Status post surgery 09/22/2021   Compartment syndrome of left upper extremity ([place]) 09/22/2021   Hyperlipidemia 12/13/2020   Atrial fibrillation ([place]) 06/10/2020   Long term (current) use of anticoagulants 06/10/2020   Secondary hypercoagulable state ([place]) 04/24/2020   AV junctional bradycardia 03/22/2019   S/P minimally invasive mitral valve repair  03/21/2019   S/P Maze operation for atrial fibrillation 03/21/2019   Dilated aortic root ([place]) 01/04/2019   Dilated cardiomyopathy ([place]) 01/04/2019   Atrial flutter with rapid ventricular response ([place])    Mitral valve prolapse    Hypertensive urgency 12/30/2018   Acute on chronic systolic (congestive) heart failure ([place]) 17/61/6073   Acute systolic heart failure ([place]) 71/04/2693   Chronic systolic (congestive) heart failure (HCC)    Persistent atrial fibrillation ([place]) 12/02/2018   Right hamstring muscle strain 07/20/2018   Essential hypertension 02/07/2016   Severe mitral regurgitation    Annual physical exam 11/16/2014   Arthritis of left knee 10/23/2014   Status post total left knee replacement 10/23/2014   Arthritis of knee, right 01/19/2014   Status post total knee replacement 01/19/2014   Benign paroxysmal positional vertigo 10/30/2013   Testicular hypofunction 03/24/2012   Past Medical History:  Diagnosis Date   Acute on chronic systolic (congestive) heart failure ([place]) 12/30/2018   Allergy  Arthritis    Atrial flutter with rapid ventricular response (HCC)    Chronic systolic (congestive) heart failure ([place])    COVID 10/2021   mild   Dilated aortic root ([place]) 01/04/2019   Dilated cardiomyopathy ([place]) 01/04/2019   GERD (gastroesophageal reflux disease)    history of   Heart murmur    History of colon polyps    Hypertension    Insomnia    Mitral valve prolapse    Mitral valve regurgitation    Persistent atrial fibrillation ([place]) 12/02/2018   S/P Maze operation  for atrial fibrillation 03/21/2019   Complete bilateral atrial lesion set using cryothermy and bipolar radiofrequency ablation with clipping of LA appendage via right mini thoracotomy approach   S/P minimally invasive mitral valve repair 03/21/2019   Complex valvuloplasty including triangular resection of posterior leaflet, artificial Gore-tex neochord placement x4 and 36 mm Sorin Memo 4D ring annuloplasty via right mini thoracotomy approach   Seizures ([place])    had one approx. 30 yrs ago,has not had any since   Severe mitral regurgitation     Family History  Problem Relation Age of Onset   Hypertension Father    Colon cancer Father        dx in his late 48's   Diabetes Mother    Stomach cancer Neg Hx    Esophageal cancer Neg Hx    Pancreatic cancer Neg Hx    Rectal cancer Neg Hx     Past Surgical History:  Procedure Laterality Date   ANKLE FUSION  left   Dec. 8502   APPLICATION OF WOUND VAC Left 09/22/2021   Procedure: APPLICATION OF WOUND VAC;  Surgeon: Mcarthur Rossetti, MD;  Location: [place];  Service: Orthopedics;  Laterality: Left;   APPLICATION OF WOUND VAC Left 12/30/2021   Procedure: APPLICATION OF WOUND VAC;  Surgeon: Mcarthur Rossetti, MD;  Location: [place];  Service: Orthopedics;  Laterality: Left;   ARTHROSCOPY KNEE W/ DRILLING  bilateral   2012   CARDIAC VALVE REPLACEMENT     CARDIOVERSION N/A 01/03/2019   Procedure: CARDIOVERSION;  Surgeon: Sueanne Margarita, MD;  Location: [place];  Service: Cardiovascular;  Laterality: N/A;   CARDIOVERSION N/A 06/23/2019   Procedure: CARDIOVERSION;  Surgeon: Fay Records, MD;  Location: [place];  Service: Cardiovascular;  Laterality: N/A;   CLIPPING OF ATRIAL APPENDAGE  03/21/2019   Procedure: Clipping Of Atrial Appendage using 56m Atricure Pro2 Clip;  Surgeon: ORexene Alberts MD;  [place]  Service: Open Heart Surgery;;   COLONOSCOPY     x2   FOOT ARTHRODESIS  06/23/2012   Procedure: ARTHRODESIS FOOT;   Surgeon: JWylene Simmer MD;  [place]  Service: Orthopedics;  Laterality: Left;  Left Subtalar and Talonavicular Joint Revision Arthrodesis  Aspiration of Bone Marrow from Left Hip    HAIR TRANSPLANT     HARDWARE REMOVAL  06/23/2012   Procedure: HARDWARE REMOVAL;  Surgeon: JWylene Simmer MD;  [place]  Service: Orthopedics;  Laterality: Left;  Removal of Deep Implant  X's 3   I & D EXTREMITY Left 09/23/2021   Procedure: IRRIGATION AND DEBRIDEMENT OF LEG;  Surgeon: BMcarthur Rossetti MD;  [place]  Service: Orthopedics;  Laterality: Left;   I & D EXTREMITY Left 09/26/2021   Procedure: REPEAT IRRIGATION AND DEBRIDEMENT LEFT LEG, POSSIBLE WOUND CLOSURE, POSSIBLE VAC CHANGE, POSSIBLE SKIN GRAFT;  Surgeon: BMcarthur Rossetti MD;  [place]  Service: Orthopedics;  Laterality:  Left;   I & D EXTREMITY Left 12/30/2021   Procedure: IRRIGATION AND DEBRIDEMENT LEFT LOWER LEG WOUND;  Surgeon: Mcarthur Rossetti, MD;  Location: [place];  Service: Orthopedics;  Laterality: Left;   I & D EXTREMITY Left 01/02/2022   Procedure: LEFT LEG DEBRIDEMENT AND TISSUE GRAFT;  Surgeon: Newt Minion, MD;  Location: [place];  Service: Orthopedics;  Laterality: Left;   INCISION AND DRAINAGE WOUND WITH FASCIOTOMY Left 09/22/2021   Procedure: INCISION AND DRAINAGE WOUND WITH FASCIOTOMY;  Surgeon: Mcarthur Rossetti, MD;  Location: [place];  Service: Orthopedics;  Laterality: Left;   INSERTION OF MESH N/A 07/10/2016   Procedure: INSERTION OF MESH;  Surgeon: Coralie Keens, MD;  Location: [place];  Service: General;  Laterality: N/A;   JOINT REPLACEMENT     right hip  01-2011   LEFT HEART CATH AND CORONARY ANGIOGRAPHY N/A 03/16/2019   Procedure: LEFT HEART CATH AND CORONARY ANGIOGRAPHY;  Surgeon: Sherren Mocha, MD;  Location: [place] [facility];  Service: Cardiovascular;  Laterality: N/A;   LIMB SPARING RESECTION HIP W/ SADDLE JOINT REPLACEMENT Right    MINIMALLY  INVASIVE MAZE PROCEDURE N/A 03/21/2019   Procedure: MINIMALLY INVASIVE MAZE PROCEDURE;  Surgeon: Rexene Alberts, MD;  Location: [place];  Service: Open Heart Surgery;  Laterality: N/A;   MITRAL VALVE REPAIR Right 03/21/2019   Procedure: MINIMALLY INVASIVE MITRAL VALVE REPAIR (MVR) using Memo 4D ring size 36;  Surgeon: Rexene Alberts, MD;  Location: [place];  Service: Open Heart Surgery;  Laterality: Right;   TEE WITHOUT CARDIOVERSION N/A 01/03/2019   Procedure: TRANSESOPHAGEAL ECHOCARDIOGRAM (TEE);  Surgeon: Sueanne Margarita, MD;  [facility];  Service: Cardiovascular;  Laterality: N/A;   TEE WITHOUT CARDIOVERSION N/A 03/21/2019   Procedure: TRANSESOPHAGEAL ECHOCARDIOGRAM (TEE);  Surgeon: Rexene Alberts, MD;  Location: [place];  Service: Open Heart Surgery;  Laterality: N/A;   TEMPORARY PACEMAKER N/A 03/22/2019   Procedure: TEMPORARY PACEMAKER;  Surgeon: Belva Crome, MD;  Location: [place] [facility] LAB;  Service: Cardiovascular;  Laterality: N/A;   TOTAL KNEE ARTHROPLASTY Right 01/19/2014   Procedure: RIGHT TOTAL KNEE ARTHROPLASTY, Steroid injection left knee;  Surgeon: Mcarthur Rossetti, MD;  [facility];  Service: Orthopedics;  Laterality: Right;   TOTAL KNEE ARTHROPLASTY Left 10/23/2014   Procedure: LEFT TOTAL KNEE ARTHROPLASTY;  Surgeon: Mcarthur Rossetti, MD;  [facility];  Service: Orthopedics;  Laterality: Left;   UMBILICAL HERNIA REPAIR N/A 07/10/2016   Procedure: UMBILICAL HERNIA REPAIR;  Surgeon: Coralie Keens, MD;  Location: [place];  Service: General;  Laterality: N/A;   Social History   Occupational History   Occupation: Pension scheme manager  Tobacco Use   Smoking status: Never   Smokeless tobacco: Never  Vaping Use   Vaping Use: Never used  Substance and Sexual Activity   Alcohol use: Yes    Alcohol/week: 2.0 - 4.0 standard drinks    Types: 1 - 2 Glasses of wine, 1 - 2 Standard drinks or equivalent per week    Comment: 2 ounces of alcohol  a day   Drug use: No   Sexual activity: Yes

## 2022-01-09 NOTE — Telephone Encounter (Signed)
SW pt, he was told to come on in ASAP this morning to evaluate. He says "he will see what he can do" and be here. He was told we have a staff meeting today form [phone] as he first wanted to come in at 42 since he is at work. ?

## 2022-01-09 NOTE — Telephone Encounter (Signed)
Called pt at # provided below and the number just rings. No answer and no VM p/u Will try again later. We need to get him in to  be evaluated and possibly remove the wound vac today under provider care. We do not have the inventory to give him several over the w/e our supply is extremely low.  ?

## 2022-01-12 ENCOUNTER — Ambulatory Visit (INDEPENDENT_AMBULATORY_CARE_PROVIDER_SITE_OTHER): Payer: Medicare HMO

## 2022-01-12 ENCOUNTER — Other Ambulatory Visit: Payer: Self-pay

## 2022-01-12 ENCOUNTER — Encounter: Payer: Medicare HMO | Admitting: Orthopedic Surgery

## 2022-01-12 DIAGNOSIS — I4892 Unspecified atrial flutter: Secondary | ICD-10-CM

## 2022-01-12 DIAGNOSIS — Z5181 Encounter for therapeutic drug level monitoring: Secondary | ICD-10-CM

## 2022-01-12 DIAGNOSIS — Z7901 Long term (current) use of anticoagulants: Secondary | ICD-10-CM | POA: Diagnosis not present

## 2022-01-12 DIAGNOSIS — I4819 Other persistent atrial fibrillation: Secondary | ICD-10-CM

## 2022-01-12 LAB — POCT INR: INR: 1.4 — AB (ref 2.0–3.0)

## 2022-01-12 NOTE — Patient Instructions (Signed)
TAKE 1.5 TABLETS TONIGHT ONLY and then Continue taking 1 tablet daily except 1.5 tablets on Wednesdays.  [phone] Bactrim started 3/8.  INR 2 weeks ?

## 2022-01-15 ENCOUNTER — Other Ambulatory Visit: Payer: Self-pay

## 2022-01-15 ENCOUNTER — Ambulatory Visit (INDEPENDENT_AMBULATORY_CARE_PROVIDER_SITE_OTHER): Payer: Medicare HMO | Admitting: Orthopedic Surgery

## 2022-01-15 DIAGNOSIS — S81802D Unspecified open wound, left lower leg, subsequent encounter: Secondary | ICD-10-CM

## 2022-01-18 ENCOUNTER — Encounter: Payer: Self-pay | Admitting: Orthopedic Surgery

## 2022-01-18 MED ORDER — HYDROCODONE-ACETAMINOPHEN 10-325 MG PO TABS
1.0000 | ORAL_TABLET | Freq: Four times a day (QID) | ORAL | 0 refills | Status: DC | PRN
Start: 2022-01-18 — End: 2022-01-31

## 2022-01-18 NOTE — Progress Notes (Signed)
Office Visit Note   Patient: [name]           Date of Birth: Jun 11, 1956           MRN: [id number] Visit Date: 01/15/2022              Requested by: Tracey Harries, MD [address] [place],  [place] [address] PCP: Tracey Harries, MD  Chief Complaint  Patient presents with   Left Leg - Routine Post Op    01/02/22 LLE deb w/ kerecis graft Wound vac taken off in office on 01/09/22      HPI: Patient is a 66 year old gentleman who presents 2 weeks status post left leg debridement and application of Kerecis skin graft.  The wound VAC was removed on the 10th.  Assessment & Plan: Visit Diagnoses:  1. Leg wound, left, subsequent encounter     Plan: Patient does have increased swelling and drainage.  Recommended daily cleansing with soap and water apply dry dressing over the wound with compression sock on top.  Harvest staples at follow-up.  Follow-Up Instructions: Return in about 1 week (around 01/22/2022).   Ortho Exam  Patient is alert, oriented, no adenopathy, well-dressed, normal affect, normal respiratory effort. Examination the wound bed has hypergranulation tissue there are no ischemic changes there is a thin layer of fibrinous exudate.  Patient is currently on Bactrim DS.  Patient denies any pain there is no odor.  Imaging: No results found.    Labs: Lab Results  Component Value Date   HGBA1C 5.2 03/16/2019   REPTSTATUS 01/07/2022 FINAL 01/02/2022   REPTSTATUS 01/08/2022 FINAL 01/02/2022   GRAMSTAIN  01/02/2022    FEW WBC PRESENT,BOTH PMN AND MONONUCLEAR RARE GRAM POSITIVE COCCI RARE GRAM NEGATIVE RODS Performed at [facility], [address] [place], [place] [address]    GRAMSTAIN  01/02/2022    FEW WBC PRESENT,BOTH PMN AND MONONUCLEAR NO ORGANISMS SEEN    CULT  01/02/2022    FEW STREPTOCOCCUS GROUP C Beta hemolytic streptococci are predictably susceptible to penicillin and other beta lactams. Susceptibility testing not routinely  performed. ABUNDANT KLEBSIELLA OXYTOCA ABUNDANT STENOTROPHOMONAS MALTOPHILIA    CULT  01/02/2022    NO ANAEROBES ISOLATED Performed at [facility], [address], [place], [place] [address]    LABORGA KLEBSIELLA OXYTOCA 01/02/2022   LABORGA STENOTROPHOMONAS MALTOPHILIA 01/02/2022     Lab Results  Component Value Date   ALBUMIN 4.1 09/22/2021   ALBUMIN 3.5 03/16/2019   ALBUMIN 2.9 (L) 01/03/2019    Lab Results  Component Value Date   MG 2.1 06/19/2019   MG 2.2 03/25/2019   MG 2.8 (H) 03/22/2019   No results found for: VD25OH  No results found for: PREALBUMIN CBC EXTENDED Latest Ref Rng & Units 01/04/2022 12/30/2021 09/26/2021  WBC 4.0 - 10.5 K/uL 8.8 10.1 8.5  RBC 4.22 - 5.81 MIL/uL 3.99(L) 4.35 3.10(L)  HGB 13.0 - 17.0 g/dL 10.1(L) 10.9(L) 9.6(L)  HCT 39.0 - 52.0 % 33.4(L) 37.0(L) 29.2(L)  PLT 150 - 400 K/uL 305 308 184  NEUTROABS 1.7 - 7.7 K/uL - - -  LYMPHSABS 0.7 - 4.0 K/uL - - -     There is no height or weight on file to calculate BMI.  Orders:  No orders of the defined types were placed in this encounter.  No orders of the defined types were placed in this encounter.    Procedures: No procedures performed  Clinical Data: No additional  findings.  ROS:  All other systems negative, except as noted in the HPI. Review of Systems  Objective: Vital Signs: There were no vitals taken for this visit.  Specialty Comments:  No specialty comments available.  PMFS History: Patient Active Problem List   Diagnosis Date Noted   Venous stasis dermatitis of both lower extremities    Bleeding on Coumadin    Leg wound, left, subsequent encounter 12/30/2021   Compartment syndrome of left lower extremity (HCC) 09/22/2021   Status post surgery 09/22/2021   Compartment syndrome of left upper extremity (HCC) 09/22/2021   Hyperlipidemia 12/13/2020   Atrial fibrillation (HCC) 06/10/2020   Long term (current) use of anticoagulants 06/10/2020   Secondary  hypercoagulable state (HCC) 04/24/2020   AV junctional bradycardia 03/22/2019   S/P minimally invasive mitral valve repair  03/21/2019   S/P Maze operation for atrial fibrillation 03/21/2019   Dilated aortic root (HCC) 01/04/2019   Dilated cardiomyopathy (HCC) 01/04/2019   Atrial flutter with rapid ventricular response (HCC)    Mitral valve prolapse    Hypertensive urgency 12/30/2018   Acute on chronic systolic (congestive) heart failure (HCC) 12/30/2018   Acute systolic heart failure (HCC) 12/30/2018   Chronic systolic (congestive) heart failure (HCC)    Persistent atrial fibrillation (HCC) 12/02/2018   Right hamstring muscle strain 07/20/2018   Essential hypertension 02/07/2016   Severe mitral regurgitation    Annual physical exam 11/16/2014   Arthritis of left knee 10/23/2014   Status post total left knee replacement 10/23/2014   Arthritis of knee, right 01/19/2014   Status post total knee replacement 01/19/2014   Benign paroxysmal positional vertigo 10/30/2013   Testicular hypofunction 03/24/2012   Past Medical History:  Diagnosis Date   Acute on chronic systolic (congestive) heart failure (HCC) 12/30/2018   Allergy    Arthritis    Atrial flutter with rapid ventricular response (HCC)    Chronic systolic (congestive) heart failure (HCC)    COVID 10/2021   mild   Dilated aortic root (HCC) 01/04/2019   Dilated cardiomyopathy (HCC) 01/04/2019   GERD (gastroesophageal reflux disease)    history of   Heart murmur    History of colon polyps    Hypertension    Insomnia    Mitral valve prolapse    Mitral valve regurgitation    Persistent atrial fibrillation (HCC) 12/02/2018   S/P Maze operation for atrial fibrillation 03/21/2019   Complete bilateral atrial lesion set using cryothermy and bipolar radiofrequency ablation with clipping of LA appendage via right mini thoracotomy approach   S/P minimally invasive mitral valve repair 03/21/2019   Complex valvuloplasty including  triangular resection of posterior leaflet, artificial Gore-tex neochord placement x4 and 36 mm Sorin Memo 4D ring annuloplasty via right mini thoracotomy approach   Seizures (HCC)    had one approx. 30 yrs ago,has not had any since   Severe mitral regurgitation     Family History  Problem Relation Age of Onset   Hypertension Father    Colon cancer Father        dx in his late 44's   Diabetes Mother    Stomach cancer Neg Hx    Esophageal cancer Neg Hx    Pancreatic cancer Neg Hx    Rectal cancer Neg Hx     Past Surgical History:  Procedure Laterality Date   ANKLE FUSION  left   Dec. 2012   APPLICATION OF WOUND VAC Left 09/22/2021   Procedure: APPLICATION OF WOUND VAC;  Surgeon: Magnus Ivan,  Vanita Panda, MD;  Location: [facility] OR;  Service: Orthopedics;  Laterality: Left;   APPLICATION OF WOUND VAC Left 12/30/2021   Procedure: APPLICATION OF WOUND VAC;  Surgeon: Kathryne Hitch, MD;  [facility];  Service: Orthopedics;  Laterality: Left;   ARTHROSCOPY KNEE W/ DRILLING  bilateral   2012   CARDIAC VALVE REPLACEMENT     CARDIOVERSION N/A 01/03/2019   Procedure: CARDIOVERSION;  Surgeon: Quintella Reichert, MD;  Location: [facility] ENDOSCOPY;  Service: Cardiovascular;  Laterality: N/A;   CARDIOVERSION N/A 06/23/2019   Procedure: CARDIOVERSION;  Surgeon: Pricilla Riffle, MD;  Location: [facility] ENDOSCOPY;  Service: Cardiovascular;  Laterality: N/A;   CLIPPING OF ATRIAL APPENDAGE  03/21/2019   Procedure: Clipping Of Atrial Appendage using 45mm Atricure Pro2 Clip;  Surgeon: Purcell Nails, MD;  [facility];  Service: Open Heart Surgery;;   COLONOSCOPY     x2   FOOT ARTHRODESIS  06/23/2012   Procedure: ARTHRODESIS FOOT;  Surgeon: Toni Arthurs, MD;  [facility];  Service: Orthopedics;  Laterality: Left;  Left Subtalar and Talonavicular Joint Revision Arthrodesis  Aspiration of Bone Marrow from Left Hip    HAIR TRANSPLANT     HARDWARE REMOVAL  06/23/2012   Procedure: HARDWARE REMOVAL;  Surgeon: Toni Arthurs, MD;  Location: [facility] OR;  Service: Orthopedics;  Laterality: Left;  Removal of Deep Implant  X's 3   I & D EXTREMITY Left 09/23/2021   Procedure: IRRIGATION AND DEBRIDEMENT OF LEG;  Surgeon: Kathryne Hitch, MD;  [facility];  Service: Orthopedics;  Laterality: Left;   I & D EXTREMITY Left 09/26/2021   Procedure: REPEAT IRRIGATION AND DEBRIDEMENT LEFT LEG, POSSIBLE WOUND CLOSURE, POSSIBLE VAC CHANGE, POSSIBLE SKIN GRAFT;  Surgeon: Kathryne Hitch, MD;  [facility];  Service: Orthopedics;  Laterality: Left;   I & D EXTREMITY Left 12/30/2021   Procedure: IRRIGATION AND DEBRIDEMENT LEFT LOWER LEG WOUND;  Surgeon: Kathryne Hitch, MD;  [facility];  Service: Orthopedics;  Laterality: Left;   I & D EXTREMITY Left 01/02/2022   Procedure: LEFT LEG DEBRIDEMENT AND TISSUE GRAFT;  Surgeon: Nadara Mustard, MD;  Location: [facility] OR;  Service: Orthopedics;  Laterality: Left;   INCISION AND DRAINAGE WOUND WITH FASCIOTOMY Left 09/22/2021   Procedure: INCISION AND DRAINAGE WOUND WITH FASCIOTOMY;  Surgeon: Kathryne Hitch, MD;  [facility];  Service: Orthopedics;  Laterality: Left;   INSERTION OF MESH N/A 07/10/2016   Procedure: INSERTION OF MESH;  Surgeon: Abigail Miyamoto, MD;  Location: [facility];  Service: General;  Laterality: N/A;   JOINT REPLACEMENT     right hip  01-2011   LEFT HEART CATH AND CORONARY ANGIOGRAPHY N/A 03/16/2019   Procedure: LEFT HEART CATH AND CORONARY ANGIOGRAPHY;  Surgeon: Tonny Bollman, MD;  Location: [facility] INVASIVE CV LAB;  Service: Cardiovascular;  Laterality: N/A;   LIMB SPARING RESECTION HIP W/ SADDLE JOINT REPLACEMENT Right    MINIMALLY INVASIVE MAZE PROCEDURE N/A 03/21/2019   Procedure: MINIMALLY INVASIVE MAZE PROCEDURE;  Surgeon: Purcell Nails, MD;  Location: [facility] OR;  Service: Open Heart Surgery;  Laterality: N/A;   MITRAL VALVE REPAIR Right 03/21/2019   Procedure: MINIMALLY INVASIVE MITRAL VALVE REPAIR (MVR) using Memo  4D ring size 36;  Surgeon: Purcell Nails, MD;  [facility];  Service: Open Heart Surgery;  Laterality: Right;   TEE WITHOUT CARDIOVERSION N/A 01/03/2019   Procedure: TRANSESOPHAGEAL ECHOCARDIOGRAM (TEE);  Surgeon: Quintella Reichert, MD;  Location[facility];  Service: Cardiovascular;  Laterality: N/A;   TEE WITHOUT CARDIOVERSION N/A 03/21/2019   Procedure: TRANSESOPHAGEAL ECHOCARDIOGRAM (TEE);  Surgeon: Purcell Nails, MD;  Location: [facility] OR;  Service: Open Heart Surgery;  Laterality: N/A;   TEMPORARY PACEMAKER N/A 03/22/2019   Procedure: TEMPORARY PACEMAKER;  Surgeon: Lyn Records, MD;  Location: [facility] INVASIVE CV LAB;  Service: Cardiovascular;  Laterality: N/A;   TOTAL KNEE ARTHROPLASTY Right 01/19/2014   Procedure: RIGHT TOTAL KNEE ARTHROPLASTY, Steroid injection left knee;  Surgeon: Kathryne Hitch, MD;  [facility];  Service: Orthopedics;  Laterality: Right;   TOTAL KNEE ARTHROPLASTY Left 10/23/2014   Procedure: LEFT TOTAL KNEE ARTHROPLASTY;  Surgeon: Kathryne Hitch, MD;  [facility];  Service: Orthopedics;  Laterality: Left;   UMBILICAL HERNIA REPAIR N/A 07/10/2016   Procedure: UMBILICAL HERNIA REPAIR;  Surgeon: Abigail Miyamoto, MD;  Location: [facility];  Service: General;  Laterality: N/A;   Social History   Occupational History   Occupation: Teaching laboratory technician  Tobacco Use   Smoking status: Never   Smokeless tobacco: Never  Vaping Use   Vaping Use: Never used  Substance and Sexual Activity   Alcohol use: Yes    Alcohol/week: 2.0 - 4.0 standard drinks    Types: 1 - 2 Glasses of wine, 1 - 2 Standard drinks or equivalent per week    Comment: 2 ounces of alcohol a day   Drug use: No   Sexual activity: Yes

## 2022-01-22 ENCOUNTER — Other Ambulatory Visit: Payer: Self-pay

## 2022-01-22 ENCOUNTER — Encounter: Payer: Self-pay | Admitting: Orthopedic Surgery

## 2022-01-22 ENCOUNTER — Ambulatory Visit (INDEPENDENT_AMBULATORY_CARE_PROVIDER_SITE_OTHER): Payer: Medicare HMO | Admitting: Orthopedic Surgery

## 2022-01-22 DIAGNOSIS — S81802D Unspecified open wound, left lower leg, subsequent encounter: Secondary | ICD-10-CM | POA: Diagnosis not present

## 2022-01-22 NOTE — Progress Notes (Signed)
? ?Office Visit Note ?  ?Patient: [name]           ?Date of Birth: October 06, 1956           ?MRN: [id number] ?Visit Date: 01/22/2022 ?             ?Requested by: [name], MD ?[address] ?[address] ?[place],  [place] [address] ?PCP: [name], MD ? ?Chief Complaint  ?Patient presents with  ? Left Leg - Routine Post Op  ?  01/02/2022 left leg debridement and Kerecis graft   ? ? ? ? ?HPI: ?Patient is a 66 year old gentleman who presents in follow-up status post debridement and application of Kerecis skin graft for chronic ulcer left leg.  Patient is currently on Bactrim DS.  His cultures are positive to 2 bacteria which were both sensitive to the sulfamethoxazole trimethoprim.  Patient states he still has some drainage not as much as there used to be and has no odor and he states he used to have a foul odor. ? ?Assessment & Plan: ?Visit Diagnoses:  ?1. Leg wound, left, subsequent encounter   ? ? ?Plan: I am still concerned about the amount of drainage that will degrade the wound bed.  We will apply a cleanse choice wound VAC sponge and a 3 layer compression wrap.  Follow-up on Monday. ? ?Follow-Up Instructions: Return in about 1 week (around 01/29/2022).  ? ?Ortho Exam ? ?Patient is alert, oriented, no adenopathy, well-dressed, normal affect, normal respiratory effort. ?Examination the wound bed is healthy.  There is less hypertrophic granulation tissue there is epithelialization around the wound edges but patient still has a little bit of drainage.  We will apply a wound VAC and a compression wrap to help decrease the drainage. ? ?Imaging: ?No results found. ? ? ? ? ?Labs: ?Lab Results  ?Component Value Date  ? HGBA1C 5.2 03/16/2019  ? REPTSTATUS 01/07/2022 FINAL 01/02/2022  ? REPTSTATUS 01/08/2022 FINAL 01/02/2022  ? GRAMSTAIN  01/02/2022  ?  FEW WBC PRESENT,BOTH PMN AND MONONUCLEAR ?RARE GRAM POSITIVE COCCI ?RARE GRAM NEGATIVE RODS ?Performed at [facility], [place] [address], [place],  [place] [address] ?  ? GRAMSTAIN  01/02/2022  ?  FEW WBC PRESENT,BOTH PMN AND MONONUCLEAR ?NO ORGANISMS SEEN ?  ? CULT  01/02/2022  ?  FEW STREPTOCOCCUS GROUP C ?Beta hemolytic streptococci are predictably susceptible to penicillin and other beta lactams. Susceptibility testing not routinely performed. ?ABUNDANT KLEBSIELLA OXYTOCA ?ABUNDANT STENOTROPHOMONAS MALTOPHILIA ?  ? CULT  01/02/2022  ?  NO ANAEROBES ISOLATED ?Performed at [facility], [place] [address], [place], [place] [address] ?  ? LABORGA KLEBSIELLA OXYTOCA 01/02/2022  ? [place] 01/02/2022  ? ? ? ?Lab Results  ?Component Value Date  ? ALBUMIN 4.1 09/22/2021  ? ALBUMIN 3.5 03/16/2019  ? ALBUMIN 2.9 (L) 01/03/2019  ? ? ?Lab Results  ?Component Value Date  ? MG 2.1 06/19/2019  ? MG 2.2 03/25/2019  ? MG 2.8 (H) 03/22/2019  ? ?No results found for: VD25OH ? ?No results found for: PREALBUMIN ? ?  Latest Ref Rng & Units 01/04/2022  ?  2:16 AM 12/30/2021  ?  1:49 PM 09/26/2021  ? 12:21 PM  ?CBC EXTENDED  ?WBC 4.0 - 10.5 K/uL 8.8   10.1   8.5    ?RBC 4.22 - 5.81 MIL/uL 3.99   4.35   3.10    ?Hemoglobin 13.0 - 17.0 g/dL 10.1   10.9   9.6    ?  HCT 39.0 - 52.0 % 33.4   37.0   29.2    ?Platelets 150 - 400 K/uL 305   308   184    ? ? ? ?There is no height or weight on file to calculate BMI. ? ?Orders:  ?No orders of the defined types were placed in this encounter. ? ?No orders of the defined types were placed in this encounter. ? ? ? Procedures: ?No procedures performed ? ?Clinical Data: ?No additional findings. ? ?ROS: ? ?All other systems negative, except as noted in the HPI. ?Review of Systems ? ?Objective: ?Vital Signs: There were no vitals taken for this visit. ? ?Specialty Comments:  ?No specialty comments available. ? ?PMFS History: ?Patient Active Problem List  ? Diagnosis Date Noted  ? Venous stasis dermatitis of both lower extremities   ? Bleeding on Coumadin   ? Leg wound, left, subsequent encounter 12/30/2021  ? Compartment syndrome of  left lower extremity ([place]) 09/22/2021  ? Status post surgery 09/22/2021  ? Compartment syndrome of left upper extremity ([place]) 09/22/2021  ? Hyperlipidemia 12/13/2020  ? Atrial fibrillation ([place]) 06/10/2020  ? Long term (current) use of anticoagulants 06/10/2020  ? Secondary hypercoagulable state ([place]) 04/24/2020  ? AV junctional bradycardia 03/22/2019  ? S/P minimally invasive mitral valve repair  03/21/2019  ? S/P Maze operation for atrial fibrillation 03/21/2019  ? Dilated aortic root ([place]) 01/04/2019  ? Dilated cardiomyopathy ([place]) 01/04/2019  ? Atrial flutter with rapid ventricular response ([place])   ? Mitral valve prolapse   ? Hypertensive urgency 12/30/2018  ? Acute on chronic systolic (congestive) heart failure ([place]) 12/30/2018  ? Acute systolic heart failure ([place]) 12/30/2018  ? Chronic systolic (congestive) heart failure (HCC)   ? Persistent atrial fibrillation ([place]) 12/02/2018  ? Right hamstring muscle strain 07/20/2018  ? Essential hypertension 02/07/2016  ? Severe mitral regurgitation   ? Annual physical exam 11/16/2014  ? Arthritis of left knee 10/23/2014  ? Status post total left knee replacement 10/23/2014  ? Arthritis of knee, right 01/19/2014  ? Status post total knee replacement 01/19/2014  ? Benign paroxysmal positional vertigo 10/30/2013  ? Testicular hypofunction 03/24/2012  ? ?Past Medical History:  ?Diagnosis Date  ? Acute on chronic systolic (congestive) heart failure ([place]) 12/30/2018  ? Allergy   ? Arthritis   ? Atrial flutter with rapid ventricular response ([place])   ? Chronic systolic (congestive) heart failure (HCC)   ? COVID 10/2021  ? mild  ? Dilated aortic root ([place]) 01/04/2019  ? Dilated cardiomyopathy ([place]) 01/04/2019  ? GERD (gastroesophageal reflux disease)   ? history of  ? Heart murmur   ? History of colon polyps   ? Hypertension   ? Insomnia   ? Mitral valve prolapse   ? Mitral valve regurgitation   ? Persistent atrial fibrillation ([place]) 12/02/2018  ? S/P Maze operation for atrial  fibrillation 03/21/2019  ? Complete bilateral atrial lesion set using cryothermy and bipolar radiofrequency ablation with clipping of LA appendage via right mini thoracotomy approach  ? S/P minimally invasive mitral valve repair 03/21/2019  ? Complex valvuloplasty including triangular resection of posterior leaflet, artificial Gore-tex neochord placement x4 and 36 mm Sorin Memo 4D ring annuloplasty via right mini thoracotomy approach  ? Seizures ([place])   ? had one approx. 30 yrs ago,has not had any since  ? Severe mitral regurgitation   ?  ?Family History  ?Problem Relation Age of Onset  ? Hypertension Father   ? Colon cancer Father   ?  dx in his late 14's  ? Diabetes Mother   ? Stomach cancer Neg Hx   ? Esophageal cancer Neg Hx   ? Pancreatic cancer Neg Hx   ? Rectal cancer Neg Hx   ?  ?Past Surgical History:  ?Procedure Laterality Date  ? ANKLE FUSION  left  ? Dec. 2012  ? APPLICATION OF WOUND VAC Left 09/22/2021  ? Procedure: APPLICATION OF WOUND VAC;  Surgeon: Mcarthur Rossetti, MD;  Location: [facility];  Service: Orthopedics;  Laterality: Left;  ? APPLICATION OF WOUND VAC Left 12/30/2021  ? Procedure: APPLICATION OF WOUND VAC;  Surgeon: Mcarthur Rossetti, MD;  Location: [facility];  Service: Orthopedics;  Laterality: Left;  ? ARTHROSCOPY KNEE W/ DRILLING  bilateral  ? 2012  ? CARDIAC VALVE REPLACEMENT    ? CARDIOVERSION N/A 01/03/2019  ? Procedure: CARDIOVERSION;  Surgeon: Sueanne Margarita, MD;  Location: [facility];  Service: Cardiovascular;  Laterality: N/A;  ? CARDIOVERSION N/A 06/23/2019  ? Procedure: CARDIOVERSION;  Surgeon: Fay Records, MD;  Location: [facility];  Service: Cardiovascular;  Laterality: N/A;  ? CLIPPING OF ATRIAL APPENDAGE  03/21/2019  ? Procedure: Clipping Of Atrial Appendage using 84m Atricure Pro2 Clip;  Surgeon: ORexene Alberts MD;  [facility];  Service: Open Heart Surgery;;  ? COLONOSCOPY    ? x2  ? FOOT ARTHRODESIS  06/23/2012  ? Procedure: ARTHRODESIS FOOT;  Surgeon:  JWylene Simmer MD;  [facility]  Service: Orthopedics;  Laterality: Left;  Left Subtalar and Talonavicular Joint Revision Arthrodesis  ?Aspiration of Bone Marrow from Left Hip   ? HAIR TRANSPLANT    ? H

## 2022-01-23 ENCOUNTER — Telehealth: Payer: Self-pay

## 2022-01-23 NOTE — Telephone Encounter (Signed)
Pt informed. I have left 3 canisters at the front desk for him to p/u today. ?

## 2022-01-23 NOTE — Telephone Encounter (Signed)
Patient called stating that he has already filled up one canister and has switched it out for another one.  Would like to know if he can get 3 more canister's until his appointment on Tuesday, 01/27/2022.  Cb# [phone].  Please advise.  Thank you. ?

## 2022-01-25 ENCOUNTER — Other Ambulatory Visit: Payer: Self-pay | Admitting: Family

## 2022-01-25 ENCOUNTER — Other Ambulatory Visit: Payer: Self-pay | Admitting: Cardiovascular Disease

## 2022-01-26 ENCOUNTER — Telehealth: Payer: Self-pay

## 2022-01-26 NOTE — Telephone Encounter (Signed)
Patient called into the office regarding his wound vac I called autumn and she advised that the patient can come in today or he can wait till his apt tomorrow since we do not have any more canisters for the vac.  ? ?Patient stated that he spends to much time up here and he just wanted a new canister for the wound vac. He stated that if this canister gets full he will take the coban off and put sterile dressing on the wound along with an ace bandage.  ? I stated to the patient that I will advise Dr. Sharol Given of the message above.  ? ?Please advise.  ?  ?

## 2022-01-26 NOTE — Telephone Encounter (Signed)
Noted  

## 2022-01-27 ENCOUNTER — Other Ambulatory Visit: Payer: Self-pay

## 2022-01-27 ENCOUNTER — Ambulatory Visit (INDEPENDENT_AMBULATORY_CARE_PROVIDER_SITE_OTHER): Payer: Medicare HMO | Admitting: Family

## 2022-01-27 ENCOUNTER — Encounter: Payer: Self-pay | Admitting: Family

## 2022-01-27 DIAGNOSIS — S81802D Unspecified open wound, left lower leg, subsequent encounter: Secondary | ICD-10-CM

## 2022-01-27 MED ORDER — HYDROCODONE-ACETAMINOPHEN 5-325 MG PO TABS: 2.0000 | ORAL_TABLET | Freq: Four times a day (QID) | ORAL | 0 refills | Status: DC | PRN

## 2022-01-27 NOTE — Progress Notes (Signed)
? ?Office Visit Note ?  ?Patient: [name]           ?Date of Birth: 09-01-1956           ?MRN: [id number] ?Visit Date: 01/27/2022 ?             ?Requested by: [name], MD ?[address] ?[address] ?[place],  [place] [address] ?PCP: [name], MD ? ?Chief Complaint  ?Patient presents with  ? Left Leg - Routine Post Op  ?  01/02/22 left leg debridement and kerecis graft  ? ? ? ? ?HPI: ?Patient is a 66 year old gentleman who presents in follow-up status post debridement and application of Kerecis skin graft for chronic ulcer left leg.  Patient is currently on Bactrim DS.  Is completing tomorrow. His cultures are positive to 2 bacteria which were both sensitive to the sulfamethoxazole trimethoprim.  ? ?Complaining about copious drainage. Has removed the wound vac yesterday at 3 pm. ? ?Assessment & Plan: ?Visit Diagnoses:  ?No diagnosis found. ? ? ?Plan: concerned for uncontrolled edema now with weeping. Will apply dynaflex compression with nonadherent dressing to wound. Follow up Friday for re application and then on following monday.  ? ?Follow-Up Instructions: Return in about 3 days (around 01/30/2022), or and then on 4/3.  ? ?Ortho Exam ? ?Patient is alert, oriented, no adenopathy, well-dressed, normal affect, normal respiratory effort. ?Examination the wound bed is healthy.  There is less hypertrophic granulation tissue there is epithelialization around the wound edges. Significant edema with weeping to LLE. Opening up new satellite ulcers. Mild erythema. No warmth. No odor.  ? ?Imaging: ?No results found. ? ? ? ? ?Labs: ?Lab Results  ?Component Value Date  ? HGBA1C 5.2 03/16/2019  ? REPTSTATUS 01/07/2022 FINAL 01/02/2022  ? REPTSTATUS 01/08/2022 FINAL 01/02/2022  ? GRAMSTAIN  01/02/2022  ?  FEW WBC PRESENT,BOTH PMN AND MONONUCLEAR ?RARE GRAM POSITIVE COCCI ?RARE GRAM NEGATIVE RODS ?Performed at [facility], [place] [address], [place], [place] [address] ?  ? GRAMSTAIN  01/02/2022  ?  FEW WBC  PRESENT,BOTH PMN AND MONONUCLEAR ?NO ORGANISMS SEEN ?  ? CULT  01/02/2022  ?  FEW STREPTOCOCCUS GROUP C ?Beta hemolytic streptococci are predictably susceptible to penicillin and other beta lactams. Susceptibility testing not routinely performed. ?ABUNDANT KLEBSIELLA OXYTOCA ?ABUNDANT STENOTROPHOMONAS MALTOPHILIA ?  ? CULT  01/02/2022  ?  NO ANAEROBES ISOLATED ?Performed at [facility], [place] [address], [place], [place] [address] ?  ? LABORGA KLEBSIELLA OXYTOCA 01/02/2022  ? [place] 01/02/2022  ? ? ? ?Lab Results  ?Component Value Date  ? ALBUMIN 4.1 09/22/2021  ? ALBUMIN 3.5 03/16/2019  ? ALBUMIN 2.9 (L) 01/03/2019  ? ? ?Lab Results  ?Component Value Date  ? MG 2.1 06/19/2019  ? MG 2.2 03/25/2019  ? MG 2.8 (H) 03/22/2019  ? ?No results found for: VD25OH ? ?No results found for: PREALBUMIN ? ?  Latest Ref Rng & Units 01/04/2022  ?  2:16 AM 12/30/2021  ?  1:49 PM 09/26/2021  ? 12:21 PM  ?CBC EXTENDED  ?WBC 4.0 - 10.5 K/uL 8.8   10.1   8.5    ?RBC 4.22 - 5.81 MIL/uL 3.99   4.35   3.10    ?Hemoglobin 13.0 - 17.0 g/dL 10.1   10.9   9.6    ?HCT 39.0 - 52.0 % 33.4   37.0   29.2    ?Platelets 150 - 400 K/uL 305   308  184    ? ? ? ?There is no height or weight on file to calculate BMI. ? ?Orders:  ?No orders of the defined types were placed in this encounter. ? ?No orders of the defined types were placed in this encounter. ? ? ? Procedures: ?No procedures performed ? ?Clinical Data: ?No additional findings. ? ?ROS: ? ?All other systems negative, except as noted in the HPI. ?Review of Systems ? ?Objective: ?Vital Signs: There were no vitals taken for this visit. ? ?Specialty Comments:  ?No specialty comments available. ? ?PMFS History: ?Patient Active Problem List  ? Diagnosis Date Noted  ? Venous stasis dermatitis of both lower extremities   ? Bleeding on Coumadin   ? Leg wound, left, subsequent encounter 12/30/2021  ? Compartment syndrome of left lower extremity ([place]) 09/22/2021  ? Status  post surgery 09/22/2021  ? Compartment syndrome of left upper extremity ([place]) 09/22/2021  ? Hyperlipidemia 12/13/2020  ? Atrial fibrillation ([place]) 06/10/2020  ? Long term (current) use of anticoagulants 06/10/2020  ? Secondary hypercoagulable state ([place]) 04/24/2020  ? AV junctional bradycardia 03/22/2019  ? S/P minimally invasive mitral valve repair  03/21/2019  ? S/P Maze operation for atrial fibrillation 03/21/2019  ? Dilated aortic root ([place]) 01/04/2019  ? Dilated cardiomyopathy ([place]) 01/04/2019  ? Atrial flutter with rapid ventricular response ([place])   ? Mitral valve prolapse   ? Hypertensive urgency 12/30/2018  ? Acute on chronic systolic (congestive) heart failure ([place]) 12/30/2018  ? Acute systolic heart failure ([place]) 12/30/2018  ? Chronic systolic (congestive) heart failure (HCC)   ? Persistent atrial fibrillation ([place]) 12/02/2018  ? Right hamstring muscle strain 07/20/2018  ? Essential hypertension 02/07/2016  ? Severe mitral regurgitation   ? Annual physical exam 11/16/2014  ? Arthritis of left knee 10/23/2014  ? Status post total left knee replacement 10/23/2014  ? Arthritis of knee, right 01/19/2014  ? Status post total knee replacement 01/19/2014  ? Benign paroxysmal positional vertigo 10/30/2013  ? Testicular hypofunction 03/24/2012  ? ?Past Medical History:  ?Diagnosis Date  ? Acute on chronic systolic (congestive) heart failure ([place]) 12/30/2018  ? Allergy   ? Arthritis   ? Atrial flutter with rapid ventricular response ([place])   ? Chronic systolic (congestive) heart failure (HCC)   ? COVID 10/2021  ? mild  ? Dilated aortic root ([place]) 01/04/2019  ? Dilated cardiomyopathy ([place]) 01/04/2019  ? GERD (gastroesophageal reflux disease)   ? history of  ? Heart murmur   ? History of colon polyps   ? Hypertension   ? Insomnia   ? Mitral valve prolapse   ? Mitral valve regurgitation   ? Persistent atrial fibrillation ([place]) 12/02/2018  ? S/P Maze operation for atrial fibrillation 03/21/2019  ? Complete bilateral  atrial lesion set using cryothermy and bipolar radiofrequency ablation with clipping of LA appendage via right mini thoracotomy approach  ? S/P minimally invasive mitral valve repair 03/21/2019  ? Complex valvuloplasty including triangular resection of posterior leaflet, artificial Gore-tex neochord placement x4 and 36 mm Sorin Memo 4D ring annuloplasty via right mini thoracotomy approach  ? Seizures ([place])   ? had one approx. 30 yrs ago,has not had any since  ? Severe mitral regurgitation   ?  ?Family History  ?Problem Relation Age of Onset  ? Hypertension Father   ? Colon cancer Father   ?     dx in his late 69's  ? Diabetes Mother   ? Stomach cancer Neg Hx   ? Esophageal  cancer Neg Hx   ? Pancreatic cancer Neg Hx   ? Rectal cancer Neg Hx   ?  ?Past Surgical History:  ?Procedure Laterality Date  ? ANKLE FUSION  left  ? Dec. 2012  ? APPLICATION OF WOUND VAC Left 09/22/2021  ? Procedure: APPLICATION OF WOUND VAC;  Surgeon: Mcarthur Rossetti, MD;  Location: [facility];  Service: Orthopedics;  Laterality: Left;  ? APPLICATION OF WOUND VAC Left 12/30/2021  ? Procedure: APPLICATION OF WOUND VAC;  Surgeon: Mcarthur Rossetti, MD;  Location: [facility];  Service: Orthopedics;  Laterality: Left;  ? ARTHROSCOPY KNEE W/ DRILLING  bilateral  ? 2012  ? CARDIAC VALVE REPLACEMENT    ? CARDIOVERSION N/A 01/03/2019  ? Procedure: CARDIOVERSION;  Surgeon: Sueanne Margarita, MD;  Location: [facility];  Service: Cardiovascular;  Laterality: N/A;  ? CARDIOVERSION N/A 06/23/2019  ? Procedure: CARDIOVERSION;  Surgeon: Fay Records, MD;  Location: [facility];  Service: Cardiovascular;  Laterality: N/A;  ? CLIPPING OF ATRIAL APPENDAGE  03/21/2019  ? Procedure: Clipping Of Atrial Appendage using 66m Atricure Pro2 Clip;  Surgeon: ORexene Alberts MD;  [facility];  Service: Open Heart Surgery;;  ? COLONOSCOPY    ? x2  ? FOOT ARTHRODESIS  06/23/2012  ? Procedure: ARTHRODESIS FOOT;  Surgeon: JWylene Simmer MD;  [facility]  Service:  Orthopedics;  Laterality: Left;  Left Subtalar and Talonavicular Joint Revision Arthrodesis  ?Aspiration of Bone Marrow from Left Hip   ? HAIR TRANSPLANT    ? HARDWARE REMOVAL  06/23/2012  ? Procedure: HARDWARE R

## 2022-01-27 NOTE — Addendum Note (Signed)
Addended by: Dondra Prader R on: 01/27/2022 11:31 AM ? ? Modules accepted: Orders ? ?

## 2022-01-28 ENCOUNTER — Ambulatory Visit (INDEPENDENT_AMBULATORY_CARE_PROVIDER_SITE_OTHER): Payer: Medicare HMO

## 2022-01-28 DIAGNOSIS — Z7901 Long term (current) use of anticoagulants: Secondary | ICD-10-CM | POA: Diagnosis not present

## 2022-01-28 DIAGNOSIS — I4892 Unspecified atrial flutter: Secondary | ICD-10-CM | POA: Diagnosis not present

## 2022-01-28 DIAGNOSIS — I4819 Other persistent atrial fibrillation: Secondary | ICD-10-CM | POA: Diagnosis not present

## 2022-01-28 DIAGNOSIS — Z5181 Encounter for therapeutic drug level monitoring: Secondary | ICD-10-CM

## 2022-01-28 LAB — POCT INR: INR: 3 (ref 2.0–3.0)

## 2022-01-28 NOTE — Patient Instructions (Signed)
Continue taking 1 tablet daily except 1.5 tablets on Wednesdays.  [phone].  INR 8 weeks ?

## 2022-01-29 ENCOUNTER — Inpatient Hospital Stay (HOSPITAL_COMMUNITY)
Admission: EM | Admit: 2022-01-29 | Discharge: 2022-01-31 | DRG: 378 | Disposition: A | Discharge status: Home / Self Care | Payer: Medicare HMO | Attending: Family Medicine | Admitting: Family Medicine

## 2022-01-29 ENCOUNTER — Observation Stay (HOSPITAL_COMMUNITY): Payer: Medicare HMO

## 2022-01-29 ENCOUNTER — Encounter (HOSPITAL_COMMUNITY): Payer: Self-pay | Admitting: Emergency Medicine

## 2022-01-29 ENCOUNTER — Other Ambulatory Visit: Payer: Self-pay

## 2022-01-29 DIAGNOSIS — N179 Acute kidney failure, unspecified: Secondary | ICD-10-CM | POA: Diagnosis present

## 2022-01-29 DIAGNOSIS — K5791 Diverticulosis of intestine, part unspecified, without perforation or abscess with bleeding: Secondary | ICD-10-CM | POA: Diagnosis not present

## 2022-01-29 DIAGNOSIS — K760 Fatty (change of) liver, not elsewhere classified: Secondary | ICD-10-CM | POA: Diagnosis present

## 2022-01-29 DIAGNOSIS — R93422 Abnormal radiologic findings on diagnostic imaging of left kidney: Secondary | ICD-10-CM | POA: Diagnosis present

## 2022-01-29 DIAGNOSIS — F1010 Alcohol abuse, uncomplicated: Secondary | ICD-10-CM | POA: Diagnosis present

## 2022-01-29 DIAGNOSIS — Z833 Family history of diabetes mellitus: Secondary | ICD-10-CM

## 2022-01-29 DIAGNOSIS — I420 Dilated cardiomyopathy: Secondary | ICD-10-CM | POA: Diagnosis present

## 2022-01-29 DIAGNOSIS — Z981 Arthrodesis status: Secondary | ICD-10-CM

## 2022-01-29 DIAGNOSIS — Z79899 Other long term (current) drug therapy: Secondary | ICD-10-CM

## 2022-01-29 DIAGNOSIS — I5022 Chronic systolic (congestive) heart failure: Secondary | ICD-10-CM | POA: Diagnosis present

## 2022-01-29 DIAGNOSIS — K5731 Diverticulosis of large intestine without perforation or abscess with bleeding: Principal | ICD-10-CM | POA: Diagnosis present

## 2022-01-29 DIAGNOSIS — K7689 Other specified diseases of liver: Secondary | ICD-10-CM | POA: Diagnosis present

## 2022-01-29 DIAGNOSIS — R739 Hyperglycemia, unspecified: Secondary | ICD-10-CM | POA: Diagnosis present

## 2022-01-29 DIAGNOSIS — I110 Hypertensive heart disease with heart failure: Secondary | ICD-10-CM | POA: Diagnosis present

## 2022-01-29 DIAGNOSIS — K922 Gastrointestinal hemorrhage, unspecified: Secondary | ICD-10-CM

## 2022-01-29 DIAGNOSIS — E785 Hyperlipidemia, unspecified: Secondary | ICD-10-CM | POA: Diagnosis present

## 2022-01-29 DIAGNOSIS — I10 Essential (primary) hypertension: Secondary | ICD-10-CM | POA: Diagnosis present

## 2022-01-29 DIAGNOSIS — R93421 Abnormal radiologic findings on diagnostic imaging of right kidney: Secondary | ICD-10-CM | POA: Diagnosis present

## 2022-01-29 DIAGNOSIS — D62 Acute posthemorrhagic anemia: Secondary | ICD-10-CM | POA: Diagnosis present

## 2022-01-29 DIAGNOSIS — Z7982 Long term (current) use of aspirin: Secondary | ICD-10-CM

## 2022-01-29 DIAGNOSIS — Z86010 Personal history of colonic polyps: Secondary | ICD-10-CM

## 2022-01-29 DIAGNOSIS — Z96653 Presence of artificial knee joint, bilateral: Secondary | ICD-10-CM | POA: Diagnosis present

## 2022-01-29 DIAGNOSIS — I4891 Unspecified atrial fibrillation: Secondary | ICD-10-CM

## 2022-01-29 DIAGNOSIS — I5042 Chronic combined systolic (congestive) and diastolic (congestive) heart failure: Secondary | ICD-10-CM

## 2022-01-29 DIAGNOSIS — I4892 Unspecified atrial flutter: Secondary | ICD-10-CM | POA: Diagnosis present

## 2022-01-29 DIAGNOSIS — K3189 Other diseases of stomach and duodenum: Secondary | ICD-10-CM | POA: Diagnosis present

## 2022-01-29 DIAGNOSIS — K921 Melena: Secondary | ICD-10-CM

## 2022-01-29 DIAGNOSIS — K449 Diaphragmatic hernia without obstruction or gangrene: Secondary | ICD-10-CM | POA: Diagnosis present

## 2022-01-29 DIAGNOSIS — L97929 Non-pressure chronic ulcer of unspecified part of left lower leg with unspecified severity: Secondary | ICD-10-CM | POA: Diagnosis present

## 2022-01-29 DIAGNOSIS — Z7901 Long term (current) use of anticoagulants: Secondary | ICD-10-CM | POA: Diagnosis not present

## 2022-01-29 DIAGNOSIS — D649 Anemia, unspecified: Secondary | ICD-10-CM

## 2022-01-29 DIAGNOSIS — I4819 Other persistent atrial fibrillation: Secondary | ICD-10-CM | POA: Diagnosis present

## 2022-01-29 DIAGNOSIS — K641 Second degree hemorrhoids: Secondary | ICD-10-CM | POA: Diagnosis present

## 2022-01-29 DIAGNOSIS — K635 Polyp of colon: Secondary | ICD-10-CM | POA: Diagnosis present

## 2022-01-29 DIAGNOSIS — E871 Hypo-osmolality and hyponatremia: Secondary | ICD-10-CM | POA: Diagnosis present

## 2022-01-29 DIAGNOSIS — R195 Other fecal abnormalities: Secondary | ICD-10-CM

## 2022-01-29 DIAGNOSIS — K644 Residual hemorrhoidal skin tags: Secondary | ICD-10-CM | POA: Diagnosis present

## 2022-01-29 DIAGNOSIS — Z800 Family history of malignant neoplasm of digestive organs: Secondary | ICD-10-CM

## 2022-01-29 DIAGNOSIS — K222 Esophageal obstruction: Secondary | ICD-10-CM | POA: Diagnosis present

## 2022-01-29 DIAGNOSIS — I959 Hypotension, unspecified: Secondary | ICD-10-CM | POA: Diagnosis not present

## 2022-01-29 DIAGNOSIS — R9389 Abnormal findings on diagnostic imaging of other specified body structures: Secondary | ICD-10-CM

## 2022-01-29 DIAGNOSIS — K625 Hemorrhage of anus and rectum: Secondary | ICD-10-CM | POA: Diagnosis present

## 2022-01-29 DIAGNOSIS — Z8679 Personal history of other diseases of the circulatory system: Secondary | ICD-10-CM

## 2022-01-29 DIAGNOSIS — Z8249 Family history of ischemic heart disease and other diseases of the circulatory system: Secondary | ICD-10-CM

## 2022-01-29 LAB — HEMOGLOBIN AND HEMATOCRIT, BLOOD
HCT: 26.8 % — ABNORMAL LOW (ref 39.0–52.0)
HCT: 24.5 % — ABNORMAL LOW (ref 39.0–52.0)
Hemoglobin: 8.2 g/dL — ABNORMAL LOW (ref 13.0–17.0)
Hemoglobin: 7.4 g/dL — ABNORMAL LOW (ref 13.0–17.0)

## 2022-01-29 LAB — COMPREHENSIVE METABOLIC PANEL
ALT: 15 U/L (ref 0–44)
AST: 17 U/L (ref 15–41)
Albumin: 3.1 g/dL — ABNORMAL LOW (ref 3.5–5.0)
Alkaline Phosphatase: 42 U/L (ref 38–126)
Anion gap: 6 (ref 5–15)
BUN: 34 mg/dL — ABNORMAL HIGH (ref 8–23)
CO2: 26 mmol/L (ref 22–32)
Calcium: 7.9 mg/dL — ABNORMAL LOW (ref 8.9–10.3)
Chloride: 100 mmol/L (ref 98–111)
Creatinine, Ser: 1.53 mg/dL — ABNORMAL HIGH (ref 0.61–1.24)
GFR, Estimated: 50 mL/min — ABNORMAL LOW (ref >60)
Glucose, Bld: 161 mg/dL — ABNORMAL HIGH (ref 70–99)
Potassium: 4.4 mmol/L (ref 3.5–5.1)
Sodium: 132 mmol/L — ABNORMAL LOW (ref 135–145)
Total Bilirubin: 0.6 mg/dL (ref 0.3–1.2)
Total Protein: 5.4 g/dL — ABNORMAL LOW (ref 6.5–8.1)

## 2022-01-29 LAB — CBC
HCT: 25.3 % — ABNORMAL LOW (ref 39.0–52.0)
Hemoglobin: 7.7 g/dL — ABNORMAL LOW (ref 13.0–17.0)
MCH: 25.2 pg — ABNORMAL LOW (ref 26.0–34.0)
MCHC: 30.4 g/dL (ref 30.0–36.0)
MCV: 82.7 fL (ref 80.0–100.0)
Platelets: 286 10*3/uL (ref 150–400)
RBC: 3.06 MIL/uL — ABNORMAL LOW (ref 4.22–5.81)
RDW: 17.5 % — ABNORMAL HIGH (ref 11.5–15.5)
WBC: 6.3 10*3/uL (ref 4.0–10.5)
nRBC: 0 % (ref 0.0–0.2)

## 2022-01-29 LAB — RETICULOCYTES
Immature Retic Fract: 33.2 % — ABNORMAL HIGH (ref 2.3–15.9)
RBC.: 3.03 MIL/uL — ABNORMAL LOW (ref 4.22–5.81)
Retic Count, Absolute: 78.2 10*3/uL (ref 19.0–186.0)
Retic Ct Pct: 2.6 % (ref 0.4–3.1)

## 2022-01-29 LAB — IRON AND TIBC
Iron: 28 ug/dL — ABNORMAL LOW (ref 45–182)
Saturation Ratios: 10 % — ABNORMAL LOW (ref 17.9–39.5)
TIBC: 283 ug/dL (ref 250–450)
UIBC: 255 ug/dL

## 2022-01-29 LAB — HEMOGLOBIN A1C
Hgb A1c MFr Bld: 5.4 % (ref 4.8–5.6)
Mean Plasma Glucose: 108.28 mg/dL

## 2022-01-29 LAB — FERRITIN: Ferritin: 18 ng/mL — ABNORMAL LOW (ref 24–336)

## 2022-01-29 LAB — MAGNESIUM: Magnesium: 2.1 mg/dL (ref 1.7–2.4)

## 2022-01-29 LAB — PROTIME-INR
INR: 2.8 — ABNORMAL HIGH (ref 0.8–1.2)
Prothrombin Time: 29.4 seconds — ABNORMAL HIGH (ref 11.4–15.2)

## 2022-01-29 LAB — HIV ANTIBODY (ROUTINE TESTING W REFLEX): HIV Screen 4th Generation wRfx: NONREACTIVE

## 2022-01-29 LAB — FOLATE: Folate: 14.5 ng/mL (ref >5.9)

## 2022-01-29 LAB — VITAMIN B12: Vitamin B-12: 214 pg/mL (ref 180–914)

## 2022-01-29 LAB — PREPARE RBC (CROSSMATCH)

## 2022-01-29 MED ORDER — LORAZEPAM 2 MG/ML IJ SOLN
1.0000 mg | INTRAMUSCULAR | Status: DC | PRN
  Administered 2022-01-30 (×2): 1 mg via INTRAVENOUS
  Filled 2022-01-29 (×2): qty 1

## 2022-01-29 MED ORDER — SODIUM CHLORIDE 0.9% IV SOLUTION: Freq: Once | INTRAVENOUS | Status: AC

## 2022-01-29 MED ORDER — PEG 3350-KCL-NABCB-NACL-NASULF 236 G PO SOLR
4000.0000 mL | Freq: Once | ORAL | Status: AC
  Administered 2022-01-29: 4000 mL via ORAL
  Filled 2022-01-29: qty 4000

## 2022-01-29 MED ORDER — LORAZEPAM 1 MG PO TABS: 1.0000 mg | ORAL_TABLET | ORAL | Status: DC | PRN

## 2022-01-29 MED ORDER — PANTOPRAZOLE SODIUM 40 MG IV SOLR
40.0000 mg | Freq: Once | INTRAVENOUS | Status: AC
  Administered 2022-01-29: 40 mg via INTRAVENOUS
  Filled 2022-01-29: qty 10

## 2022-01-29 MED ORDER — ATORVASTATIN CALCIUM 20 MG PO TABS
20.0000 mg | ORAL_TABLET | Freq: Every morning | ORAL | Status: DC
  Administered 2022-01-30 – 2022-01-31 (×2): 20 mg via ORAL
  Filled 2022-01-29 (×2): qty 1

## 2022-01-29 MED ORDER — THIAMINE HCL 100 MG PO TABS
100.0000 mg | ORAL_TABLET | Freq: Every day | ORAL | Status: DC
  Administered 2022-01-29 – 2022-01-31 (×3): 100 mg via ORAL
  Filled 2022-01-29 (×3): qty 1

## 2022-01-29 MED ORDER — SODIUM CHLORIDE 0.9 % IV BOLUS
1000.0000 mL | Freq: Once | INTRAVENOUS | Status: AC
  Administered 2022-01-29: 1000 mL via INTRAVENOUS

## 2022-01-29 MED ORDER — BISACODYL 5 MG PO TBEC: 20.0000 mg | DELAYED_RELEASE_TABLET | Freq: Every day | ORAL | Status: DC | PRN

## 2022-01-29 MED ORDER — MELATONIN 5 MG PO TABS
10.0000 mg | ORAL_TABLET | Freq: Every day | ORAL | Status: DC
  Administered 2022-01-30: 10 mg via ORAL
  Filled 2022-01-29 (×2): qty 2

## 2022-01-29 MED ORDER — ADULT MULTIVITAMIN W/MINERALS CH
1.0000 | ORAL_TABLET | Freq: Every day | ORAL | Status: DC
  Administered 2022-01-30 – 2022-01-31 (×2): 1 via ORAL
  Filled 2022-01-29 (×2): qty 1

## 2022-01-29 MED ORDER — THIAMINE HCL 100 MG/ML IJ SOLN: 100.0000 mg | Freq: Every day | INTRAMUSCULAR | Status: DC

## 2022-01-29 MED ORDER — SODIUM CHLORIDE 0.9 % IV SOLN: Freq: Once | INTRAVENOUS | Status: AC

## 2022-01-29 MED ORDER — PANTOPRAZOLE SODIUM 40 MG IV SOLR
40.0000 mg | INTRAVENOUS | Status: DC
Start: 2022-01-30 — End: 2022-01-31
  Administered 2022-01-30 – 2022-01-31 (×2): 40 mg via INTRAVENOUS
  Filled 2022-01-29 (×2): qty 10

## 2022-01-29 MED ORDER — HYDROCODONE-ACETAMINOPHEN 5-325 MG PO TABS
2.0000 | ORAL_TABLET | Freq: Four times a day (QID) | ORAL | Status: DC | PRN
Start: 2022-01-29 — End: 2022-01-31
  Administered 2022-01-29 – 2022-01-31 (×5): 2 via ORAL
  Filled 2022-01-29 (×5): qty 2

## 2022-01-29 MED ORDER — FOLIC ACID 1 MG PO TABS
1.0000 mg | ORAL_TABLET | Freq: Every day | ORAL | Status: DC
  Administered 2022-01-30 – 2022-01-31 (×2): 1 mg via ORAL
  Filled 2022-01-29 (×2): qty 1

## 2022-01-29 MED ORDER — PEG 3350-KCL-NABCB-NACL-NASULF 236 G PO SOLR
4000.0000 mL | Freq: Once | ORAL | Status: DC
  Filled 2022-01-29: qty 4000

## 2022-01-29 MED ORDER — ALPRAZOLAM 0.5 MG PO TABS
0.5000 mg | ORAL_TABLET | Freq: Two times a day (BID) | ORAL | Status: DC
  Administered 2022-01-30 – 2022-01-31 (×3): 0.5 mg via ORAL
  Filled 2022-01-29 (×4): qty 1

## 2022-01-29 NOTE — Consult Note (Addendum)
? ? ? ? ? Consultation ? ?Referring Provider:   Dr. Tyrone Nine ?Primary Care Physician:  [name], MD ?Primary Gastroenterologist:  Dr. Fuller Plan       ?Reason for Consultation:   GI bleed on coumadin, anemia ? ? Impression   ? ?GI bleed with some hypotension/soft BPs ?In the setting of Coumadin ?HGB 7.7 MCV 82.7 Platelets 286 ?BUN 34 Cr 1.53 ?Had increase in BUN and creatinine compared to 3 weeks ago, hemoglobin 10.9--->7.7.  Normocytic-no recent anemia studies. ?09/17/2017 colonoscopy  Multiple diverticula sigmoid, descending and transverse colon.  No evidence of diverticular bleeding.  Internal hemorrhoid grade 1, 2 hyperplastic polyps.   ?Recall end of this year.  ?Has never had EGD. ?Minimal abdominal discomfort during bowel movements, most likely this is diverticular bleed, less likely ischemic colitis, brisk upper GI bleed with black stools however patient was taking Pepto. ? ?Normocytic acute on chronic anemia ?CBC on 01/29/2022   ?WBC 6.3 HGB 7.7 MCV 82.7 Platelets 286 ? ?A Fibrillation on Coumadin ?Last INR 2.8, on hold at this time.  ? ?Chronic combined systolic and diastolic heart failure ?08/08/2021 echocardiogram 45 to 50% ? ? ? ? Plan  ? ?-Hold Coumadin at this time ?-Daily INR ?- anemia panel ?- continue with NPO at this time ?-Started on proton pump inhibitor 40 mg IV daily ?--Continue to monitor H&H with transfusion as needed to maintain hemoglobin greater than 8 given cardiac history. ?- RUQ [place] at some point with ETOH history ?-Last colonoscopy was 09/17/2017, due 09/2022. ?patient is due for outpatient colonoscopy, would suggest EGD at same time with ETOH history.  ?-Monitor overnight if rebleeding proceed with CTA and consult IR if positive. ?-Timing of endoscopic evaluation with further recommendations per Dr. Rush Landmark, tentatively on for EGD/Flex sig- will do liquid diet today with NPO at 8 AM.  ?Will reevaluate in the morning ? ?Thank you for your kind consultation, we will continue to  follow. ?       ? HPI:   ?[name] is a 66 y.o. male with past medical history significant for Diverticulosis, colon polyps, atrial fibrillation on Coumadin, severe mitral regurgitation s/p repair with minimally invasive mitral valve repair, Maze procedure and left atrial clipping, 03/2019, chronic combined systolic and diastolic CHF, hypertension.  ? ?08/08/2021 echocardiogram 45 to 50% ?09/17/2017 colonoscopy Dr. Fuller Plan Due to personal history of adenomatous polyps, previous colonoscopy 06/10/2012, family history of colon cancer.  Patient is due 09/17/2022 ?Multiple diverticula sigmoid, descending and transverse colon.  No evidence of diverticular bleeding.  Internal hemorrhoid grade 1, 2 hyperplastic polyps.  Recall 5 years. ? ?Patient is never had GI bleed prior to this. ?Was in normal state of health when 3 PM yesterday began to have bloody stool, bright red blood with some fecal matter.  Moderate to large volume per patient. ?Proceeded to have 4 more episodes last 1 being 5 AM this morning. ?Then patient had 1 more at 9 AM this morning more formed stool, still loose, more black stool with less blood.   ?Patient had been taking Pepto-Bismol. ?Patient states he had very light abdominal discomfort, cramping with bowel movements. ?Patient began to have dizziness especially with standing which prompted him to come to the ER.  Otherwise he states he would not of come.  Denies chest pain or shortness of breath. ? ?Patient denies reflux, GERD, dysphagia, nausea or vomiting. ?Not on NSAIDs, but does state he drinks alcohol at least 2 shots of bourbon nightly "for his whole life". ?  He is on hydrocodone for leg ulcer, last dose of Bactrim was Wednesday. ? ?ED course: INR 3.0, hemoglobin 7.7 down from 10.44-monthago, baseline appears to be around 10.  Did have normocytic anemia of 7.84 months ago. ?Remote history of thrombocytopenia, currently platelets 286.  No leukocytosis. ?3 weeks ago BUN 24 , creatinine 1.07,  currently BUN 34, creatinine 1.53 ? ?Of note patient has been being followed by Dr. DSharol Givenoutpatient for debridement and application of skin graft for chronic left leg ulcer.  Has been on Bactrim DS. ? ?Previous GI work up: ?Last office visit 07/11/2020 by PTye Savoy NP for rectal bleeding.  Found to have anal fissure at that time.  ? ?01/03/2019 CTA showed normal liver size, scattered simple liver cysts, subcentimeter hypodense left liver lobe lesion too small to characterize, no gallstones, with gallbladder wall thickening, no biliary dilatation.  Normal pancreas ? ?Past Medical History:  ?Diagnosis Date  ? Acute on chronic systolic (congestive) heart failure ([facility] 12/30/2018  ? Allergy   ? Arthritis   ? Atrial flutter with rapid ventricular response ([facility]   ? Chronic systolic (congestive) heart failure (HCC)   ? COVID 10/2021  ? mild  ? Dilated aortic root ([facility] 01/04/2019  ? Dilated cardiomyopathy ([facility] 01/04/2019  ? GERD (gastroesophageal reflux disease)   ? history of  ? Heart murmur   ? History of colon polyps   ? Hypertension   ? Insomnia   ? Mitral valve prolapse   ? Mitral valve regurgitation   ? Persistent atrial fibrillation ([facility] 12/02/2018  ? S/P Maze operation for atrial fibrillation 03/21/2019  ? Complete bilateral atrial lesion set using cryothermy and bipolar radiofrequency ablation with clipping of LA appendage via right mini thoracotomy approach  ? S/P minimally invasive mitral valve repair 03/21/2019  ? Complex valvuloplasty including triangular resection of posterior leaflet, artificial Gore-tex neochord placement x4 and 36 mm Sorin Memo 4D ring annuloplasty via right mini thoracotomy approach  ? Seizures ([facility]   ? had one approx. 30 yrs ago,has not had any since  ? Severe mitral regurgitation   ? ? ?Surgical History:  ?He  has a past surgical history that includes Arthroscopy knee w/ drilling (bilateral); Ankle Fusion (left); Joint replacement; Colonoscopy; Hardware Removal (06/23/2012);  Foot arthrodesis (06/23/2012); Hair transplant; Total knee arthroplasty (Right, 01/19/2014); Total knee arthroplasty (Left, 10/23/2014); Umbilical hernia repair (N/A, 07/10/2016); Insertion of mesh (N/A, 07/10/2016); Limb sparing resection hip w/ saddle joint replacement (Right); TEE without cardioversion (N/A, 01/03/2019); Cardioversion (N/A, 01/03/2019); Mitral valve repair (Right, 03/21/2019); Minimally invasive maze procedure (N/A, 03/21/2019); TEE without cardioversion (N/A, 03/21/2019); Clipping of atrial appendage (03/21/2019); TEMPORARY PACEMAKER (N/A, 03/22/2019); LEFT HEART CATH AND CORONARY ANGIOGRAPHY (N/A, 03/16/2019); Cardiac valve replacement; Cardioversion (N/A, 06/23/2019); Incision and drainage wound with fasciotomy (Left, 09/22/2021); Application if wound vac (Left, 09/22/2021); I & D extremity (Left, 09/23/2021); I & D extremity (Left, 09/26/2021); I & D extremity (Left, 12/30/2021); Application if wound vac (Left, 12/30/2021); and I & D extremity (Left, 01/02/2022). ?Family History:  ?His family history includes Colon cancer in his father; Diabetes in his mother; Hypertension in his father. ?Social History:  ? reports that he has never smoked. He has never used smokeless tobacco. He reports current alcohol use of about 2.0 - 4.0 standard drinks per week. He reports that he does not use drugs. ? ?Prior to Admission medications   ?Medication Sig Start Date End Date Taking? Authorizing Provider  ?ALPRAZolam (XANAX) 0.5 MG tablet Take 0.5 mg by mouth  2 (two) times daily. 12/16/21   [provider]  ?aspirin EC 81 MG EC tablet Take 1 tablet (81 mg total) by mouth daily. 03/27/19   John Giovanni, PA-C  ?atorvastatin (LIPITOR) 20 MG tablet Take 20 mg by mouth daily.    [provider]  ?docusate sodium (COLACE) 100 MG capsule Take 300 mg by mouth daily.    [provider]  ?HYDROcodone-acetaminophen (NORCO) 10-325 MG tablet Take 1 tablet by mouth every 6 (six) hours as needed for severe pain.  01/18/22   Newt Minion, MD  ?HYDROcodone-acetaminophen (NORCO/VICODIN) 5-325 MG tablet Take 2 tablets by mouth every 6 (six) hours as needed for moderate pain. 01/27/22   Suzan Slick, NP  ?losartan (COZAAR) 50 MG

## 2022-01-29 NOTE — H&P (Addendum)
?History and Physical  ? ? ?Patient: [name] ZOX:[id number] DOB: 06-07-1956 ?DOA: 01/29/2022 ?DOS: the patient was seen and examined on 01/29/2022 ?PCP: [name], MD  ?Patient coming from: Home ? ?Chief Complaint:  ?Chief Complaint  ?Patient presents with  ? Abdominal Pain  ? Rectal Bleeding  ? ?HPI: [name] is a 66 y.o. male with medical history significant of a fib on coumadin, chronic systolic HF, HTN, HLD. Presenting with rectal bleeding. His symptoms started yesterday afternoon around 1500hrs. He reports that he notice some "red diarrhea". He had some grumbling in his stomach, but no real pain. He tried some pepto bismol, but he still had several episodes of bloody diarrhea into the night. By this morning around 0500hrs, his stool had become less frank blood and more black. He got ready to go to work and felt lightheaded/dizzy. He became concerned and had his wife bring him to the ED for evaluation. He denies any recent NSAID use. He is on coumadin. He did not have any chest pain or dyspnea. He denies any other aggravating or alleviating factors.   ? ?Review of Systems: As mentioned in the history of present illness. All other systems reviewed and are negative. ?Past Medical History:  ?Diagnosis Date  ? Acute on chronic systolic (congestive) heart failure ([place]) 12/30/2018  ? Allergy   ? Arthritis   ? Atrial flutter with rapid ventricular response ([place])   ? Chronic systolic (congestive) heart failure (HCC)   ? COVID 10/2021  ? mild  ? Dilated aortic root ([place]) 01/04/2019  ? Dilated cardiomyopathy ([place]) 01/04/2019  ? GERD (gastroesophageal reflux disease)   ? history of  ? Heart murmur   ? History of colon polyps   ? Hypertension   ? Insomnia   ? Mitral valve prolapse   ? Mitral valve regurgitation   ? Persistent atrial fibrillation ([place]) 12/02/2018  ? S/P Maze operation for atrial fibrillation 03/21/2019  ? Complete bilateral atrial lesion set using cryothermy and bipolar radiofrequency ablation  with clipping of LA appendage via right mini thoracotomy approach  ? S/P minimally invasive mitral valve repair 03/21/2019  ? Complex valvuloplasty including triangular resection of posterior leaflet, artificial Gore-tex neochord placement x4 and 36 mm Sorin Memo 4D ring annuloplasty via right mini thoracotomy approach  ? Seizures ([place])   ? had one approx. 30 yrs ago,has not had any since  ? Severe mitral regurgitation   ? ?Past Surgical History:  ?Procedure Laterality Date  ? ANKLE FUSION  left  ? Dec. 2012  ? APPLICATION OF WOUND VAC Left 09/22/2021  ? Procedure: APPLICATION OF WOUND VAC;  Surgeon: Mcarthur Rossetti, MD;  Location: [facility];  Service: Orthopedics;  Laterality: Left;  ? APPLICATION OF WOUND VAC Left 12/30/2021  ? Procedure: APPLICATION OF WOUND VAC;  Surgeon: Mcarthur Rossetti, MD;  Location: [place];  Service: Orthopedics;  Laterality: Left;  ? ARTHROSCOPY KNEE W/ DRILLING  bilateral  ? 2012  ? CARDIAC VALVE REPLACEMENT    ? CARDIOVERSION N/A 01/03/2019  ? Procedure: CARDIOVERSION;  Surgeon: Sueanne Margarita, MD;  Location: [place];  Service: Cardiovascular;  Laterality: N/A;  ? CARDIOVERSION N/A 06/23/2019  ? Procedure: CARDIOVERSION;  Surgeon: Fay Records, MD;  Location: [facility];  Service: Cardiovascular;  Laterality: N/A;  ? CLIPPING OF ATRIAL APPENDAGE  03/21/2019  ? Procedure: Clipping Of Atrial Appendage using 47m Atricure Pro2 Clip;  Surgeon: ORexene Alberts MD;  [place] [facility]  Service: Open Heart  Surgery;;  ? COLONOSCOPY    ? x2  ? FOOT ARTHRODESIS  06/23/2012  ? Procedure: ARTHRODESIS FOOT;  Surgeon: Wylene Simmer, MD;  Location: [facility];  Service: Orthopedics;  Laterality: Left;  Left Subtalar and Talonavicular Joint Revision Arthrodesis  ?Aspiration of Bone Marrow from Left Hip   ? HAIR TRANSPLANT    ? HARDWARE REMOVAL  06/23/2012  ? Procedure: HARDWARE REMOVAL;  Surgeon: Wylene Simmer, MD;  Location: [facility];  Service: Orthopedics;  Laterality: Left;  Removal of Deep Implant   X's 3  ? I & D EXTREMITY Left 09/23/2021  ? Procedure: IRRIGATION AND DEBRIDEMENT OF LEG;  Surgeon: Mcarthur Rossetti, MD;  Location: [facility];  Service: Orthopedics;  Laterality: Left;  ? I & D EXTREMITY Left 09/26/2021  ? Procedure: REPEAT IRRIGATION AND DEBRIDEMENT LEFT LEG, POSSIBLE WOUND CLOSURE, POSSIBLE VAC CHANGE, POSSIBLE SKIN GRAFT;  Surgeon: Mcarthur Rossetti, MD;  Location: [facility];  Service: Orthopedics;  Laterality: Left;  ? I & D EXTREMITY Left 12/30/2021  ? Procedure: IRRIGATION AND DEBRIDEMENT LEFT LOWER LEG WOUND;  Surgeon: Mcarthur Rossetti, MD;  Location: [facility];  Service: Orthopedics;  Laterality: Left;  ? I & D EXTREMITY Left 01/02/2022  ? Procedure: LEFT LEG DEBRIDEMENT AND TISSUE GRAFT;  Surgeon: Newt Minion, MD;  Location: [facility] [place];  Service: Orthopedics;  Laterality: Left;  ? INCISION AND DRAINAGE WOUND WITH FASCIOTOMY Left 09/22/2021  ? Procedure: INCISION AND DRAINAGE WOUND WITH FASCIOTOMY;  Surgeon: Mcarthur Rossetti, MD;  Location: [facility];  Service: Orthopedics;  Laterality: Left;  ? INSERTION OF MESH N/A 07/10/2016  ? Procedure: INSERTION OF MESH;  Surgeon: Coralie Keens, MD;  Location: [facility];  Service: General;  Laterality: N/A;  ? JOINT REPLACEMENT    ? right hip  01-2011  ? LEFT HEART CATH AND CORONARY ANGIOGRAPHY N/A 03/16/2019  ? Procedure: LEFT HEART CATH AND CORONARY ANGIOGRAPHY;  Surgeon: Sherren Mocha, MD;  Location: [facility];  Service: Cardiovascular;  Laterality: N/A;  ? LIMB SPARING RESECTION HIP W/ SADDLE JOINT REPLACEMENT Right   ? MINIMALLY INVASIVE MAZE PROCEDURE N/A 03/21/2019  ? Procedure: MINIMALLY INVASIVE MAZE PROCEDURE;  Surgeon: Rexene Alberts, MD;  Location: [facility];  Service: Open Heart Surgery;  Laterality: N/A;  ? MITRAL VALVE REPAIR Right 03/21/2019  ? Procedure: MINIMALLY INVASIVE MITRAL VALVE REPAIR (MVR) using Memo 4D ring size 36;  Surgeon: Rexene Alberts, MD;  Location: [facility];  Service: Open Heart Surgery;   Laterality: Right;  ? TEE WITHOUT CARDIOVERSION N/A 01/03/2019  ? Procedure: TRANSESOPHAGEAL ECHOCARDIOGRAM (TEE);  Surgeon: Sueanne Margarita, MD;  [facility] END[facility];  Service: Cardiovascular;  Laterality: N/A;  ? TEE WITHOUT CARDIOVERSION N/A 03/21/2019  ? Procedure: TRANSESOPHAGEAL ECHOCARDIOGRAM (TEE);  Surgeon: Rexene Alberts, MD;  Location: [place];  Service: Open Heart Surgery;  Laterality: N/A;  ? TEMPORARY PACEMAKER N/A 03/22/2019  ? Procedure: TEMPORARY PACEMAKER;  Surgeon: Belva Crome, MD;  Location: [place] [facility];  Service: Cardiovascular;  Laterality: N/A;  ? TOTAL KNEE ARTHROPLASTY Right 01/19/2014  ? Procedure: RIGHT TOTAL KNEE ARTHROPLASTY, Steroid injection left knee;  Surgeon: Mcarthur Rossetti, MD;  Location: [facility];  Service: Orthopedics;  Laterality: Right;  ? TOTAL KNEE ARTHROPLASTY Left 10/23/2014  ? Procedure: LEFT TOTAL KNEE ARTHROPLASTY;  Surgeon: Mcarthur Rossetti, MD;  Location: [facility];  Service: Orthopedics;  Laterality: Left;  ? UMBILICAL HERNIA REPAIR N/A 07/10/2016  ? Procedure: UMBILICAL HERNIA REPAIR;  Surgeon: Coralie Keens, MD;  Location: [place];  Service: General;  Laterality: N/A;  ? ?Social History:  reports that he has never smoked. He has never used smokeless tobacco. He reports current alcohol use of about 2.0 - 4.0 standard drinks per week. He reports that he does not use drugs. ? ?No Known Allergies ? ?Family History  ?Problem Relation Age of Onset  ? Hypertension Father   ? Colon cancer Father   ?     dx in his late 5's  ? Diabetes Mother   ? Stomach cancer Neg Hx   ? Esophageal cancer Neg Hx   ? Pancreatic cancer Neg Hx   ? Rectal cancer Neg Hx   ? ? ?Prior to Admission medications   ?Medication Sig Start Date End Date Taking? Authorizing Provider  ?ALPRAZolam (XANAX) 0.5 MG tablet Take 0.5 mg by mouth 2 (two) times daily. 12/16/21   [provider]  ?aspirin EC 81 MG EC tablet Take 1 tablet (81 mg total) by mouth daily.  03/27/19   John Giovanni, PA-C  ?atorvastatin (LIPITOR) 20 MG tablet Take 20 mg by mouth daily.    [provider]  ?docusate sodium (COLACE) 100 MG capsule Take 300 mg by mouth daily.    Provider,

## 2022-01-29 NOTE — ED Triage Notes (Signed)
Per pt, states he has been having abdominal pain since yesterday-had a bloody BM yesterday am and 3 more after that-states some blood in stool this am but stool was dark-states he has been taking Pepto for his symptoms since yesterday ?

## 2022-01-29 NOTE — ED Provider Notes (Signed)
?[facility] DEPT ?Provider Note ? ? ?CSN: [id number] ?Arrival date & time: 01/29/22  2952 ? ?  ? ?History ? ?Chief Complaint  ?Patient presents with  ? Abdominal Pain  ? Rectal Bleeding  ? ? ?[name] is a 66 y.o. male. ? ?66 yO M with a chief complaints of bright red blood per rectum.  He had about 5 episodes in total.  The when he had this morning there was some dark stool intermixed though he has been taking Pepto-Bismol to try and help him with his symptoms.  He feels like his stomach is a little bit upset usually prior to having a bowel movement.  No vomiting no fevers no overt abdominal discomfort.  He is on Coumadin for A-fib and last had his INR checked yesterday was 3.  He was a little bit lightheaded this morning which is what brought him into the emergency department for evaluation.  His wife thinks he looks a little bit pale compared to baseline. ? ? ?Abdominal Pain ?Associated symptoms: hematochezia   ?Rectal Bleeding ?Associated symptoms: abdominal pain   ? ?  ? ?Home Medications ?Prior to Admission medications   ?Medication Sig Start Date End Date Taking? Authorizing Provider  ?ALPRAZolam (XANAX) 0.5 MG tablet Take 0.5 mg by mouth 2 (two) times daily. 12/16/21   [provider]  ?aspirin EC 81 MG EC tablet Take 1 tablet (81 mg total) by mouth daily. 03/27/19   John Giovanni, PA-C  ?atorvastatin (LIPITOR) 20 MG tablet Take 20 mg by mouth daily.    [provider]  ?docusate sodium (COLACE) 100 MG capsule Take 300 mg by mouth daily.    [provider]  ?HYDROcodone-acetaminophen (NORCO) 10-325 MG tablet Take 1 tablet by mouth every 6 (six) hours as needed for severe pain. 01/18/22   Newt Minion, MD  ?HYDROcodone-acetaminophen (NORCO/VICODIN) 5-325 MG tablet Take 2 tablets by mouth every 6 (six) hours as needed for moderate pain. 01/27/22   Suzan Slick, NP  ?losartan (COZAAR) 50 MG tablet Take 1 tablet by mouth once daily 12/15/21   Lorretta Harp, MD  ?Melatonin 10 MG TABS Take 10 mg by mouth at bedtime.    [provider]  ?metoprolol tartrate (LOPRESSOR) 25 MG tablet Take 1/2 (one-half) tablet by mouth twice daily ?Patient taking differently: Take 12.5 mg by mouth 2 (two) times daily. 03/17/21   Lorretta Harp, MD  ?Multiple Vitamin (MULTIVITAMIN WITH MINERALS) TABS tablet Take 1 tablet by mouth daily.    [provider]  ?Multiple Vitamins-Minerals (MULTIVITAMIN WITH MINERALS) tablet Take 1 tablet by mouth daily.    [provider]  ?potassium chloride (KLOR-CON M) 10 MEQ tablet Take 1 tablet (10 mEq total) by mouth 2 (two) times daily. Please schedule appt for future refills. 1st attempt 01/27/22   Lorretta Harp, MD  ?spironolactone (ALDACTONE) 25 MG tablet Take 1 tablet by mouth once daily 12/08/21   Lorretta Harp, MD  ?sulfamethoxazole-trimethoprim (BACTRIM DS) 800-160 MG tablet Take 1 tablet by mouth 2 (two) times daily. 01/07/22   Suzan Slick, NP  ?torsemide (DEMADEX) 20 MG tablet Take 1 tablet (20 mg total) by mouth daily. 03/18/21   Lorretta Harp, MD  ?warfarin (COUMADIN) 5 MG tablet Take 1-1.5 tablets (5-7.5 mg total) by mouth See admin instructions. Takes 5 mg daily except Wednesday. Takes 7.5 mg once daily on Wednesday 11/04/21   Lorretta Harp, MD  ?   ? ?  Allergies    ?Patient has no known allergies.   ? ?Review of Systems   ?Review of Systems  ?Gastrointestinal:  Positive for abdominal pain and hematochezia.  ? ?Physical Exam ?Updated Vital Signs ?BP 92/60   Pulse 90   Temp 97.8 ?F (36.6 ?C) (Oral)   Resp 18   SpO2 96%  ?Physical Exam ?Vitals and nursing note reviewed.  ?Constitutional:   ?   Appearance: He is well-developed.  ?HENT:  ?   Head: Normocephalic and atraumatic.  ?Eyes:  ?   Pupils: Pupils are equal, round, and reactive to light.  ?Neck:  ?   Vascular: No JVD.  ?Cardiovascular:  ?   Rate and Rhythm: Normal rate and regular rhythm.  ?   Heart sounds: No murmur heard. ?  No  friction rub. No gallop.  ?Pulmonary:  ?   Effort: No respiratory distress.  ?   Breath sounds: No wheezing.  ?Abdominal:  ?   General: There is no distension.  ?   Tenderness: There is no abdominal tenderness. There is no guarding or rebound.  ?   Comments: Benign abdominal exam  ?Musculoskeletal:     ?   General: Normal range of motion.  ?   Cervical back: Normal range of motion and neck supple.  ?Skin: ?   Coloration: Skin is not pale.  ?   Findings: No rash.  ?Neurological:  ?   Mental Status: He is alert and oriented to person, place, and time.  ?Psychiatric:     ?   Behavior: Behavior normal.  ? ? ?ED Results / Procedures / Treatments   ?Labs ?(all labs ordered are listed, but only abnormal results are displayed) ?Labs Reviewed  ?COMPREHENSIVE METABOLIC PANEL - Abnormal; Notable for the following components:  ?    Result Value  ? Sodium 132 (*)   ? Glucose, Bld 161 (*)   ? BUN 34 (*)   ? Creatinine, Ser 1.53 (*)   ? Calcium 7.9 (*)   ? Total Protein 5.4 (*)   ? Albumin 3.1 (*)   ? GFR, Estimated 50 (*)   ? All other components within normal limits  ?CBC - Abnormal; Notable for the following components:  ? RBC 3.06 (*)   ? Hemoglobin 7.7 (*)   ? HCT 25.3 (*)   ? MCH 25.2 (*)   ? RDW 17.5 (*)   ? All other components within normal limits  ?PROTIME-INR - Abnormal; Notable for the following components:  ? Prothrombin Time 29.4 (*)   ? INR 2.8 (*)   ? All other components within normal limits  ?POC OCCULT BLOOD, ED  ?TYPE AND SCREEN  ?PREPARE RBC (CROSSMATCH)  ? ? ?EKG ?None ? ?Radiology ?No results found. ? ?Procedures ?Procedures  ? ? ?Medications Ordered in ED ?Medications  ?0.9 %  sodium chloride infusion (Manually program via Guardrails IV Fluids) (has no administration in time range)  ?pantoprazole (PROTONIX) injection 40 mg (40 mg Intravenous Given 01/29/22 1020)  ?sodium chloride 0.9 % bolus 1,000 mL (1,000 mLs Intravenous New Bag/Given 01/29/22 1021)  ? ? ?ED Course/ Medical Decision Making/ A&P ?  ?                         ?Medical Decision Making ?Amount and/or Complexity of Data Reviewed ?Labs: ordered. ? ?Risk ?Prescription drug management. ? ? ?66 yo M with a chief complaints of bright red blood per rectum.  Going on since  yesterday afternoon.  Has had about 5 grossly bloody bowel movements.  This morning he thought it was little bit more solid with some dark stool though has been taking Pepto-Bismol for upset stomach.  We will give a dose of Protonix though it sounds more likely the patient is having a lower GI bleed based on his history.  On my review of the patient's medical record he does have a history of diverticulosis. ? ?He also recently was on Bactrim for skin graft infection.  Thought to be looking quite a bit better per the patient and family. ? ?Hemoglobin about 3 g below last check about a month ago.  Will discuss with GI. ? ?I discussed with Estill Bamberg who is on for [facility], will come and see the patient at bedside.  Recommended if he had another grossly bloody bowel movement here to do a CT angiogram of the abdomen pelvis. ? ?I discussed case with hospitalist for admission.  Recommended transfusion. ? ?CRITICAL CARE ?Performed by: Cecilio Asper ? ? ?Total critical care time: 35 minutes ? ?Critical care time was exclusive of separately billable procedures and treating other patients. ? ?Critical care was necessary to treat or prevent imminent or life-threatening deterioration. ? ?Critical care was time spent personally by me on the following activities: development of treatment plan with patient and/or surrogate as well as nursing, discussions with consultants, evaluation of patient's response to treatment, examination of patient, obtaining history from patient or surrogate, ordering and performing treatments and interventions, ordering and review of laboratory studies, ordering and review of radiographic studies, pulse oximetry and re-evaluation of patient's condition. ? ?The patients results and  plan were reviewed and discussed.   ?Any x-rays performed were independently reviewed by myself.  ? ?Differential diagnosis were considered with the presenting HPI. ? ?Medications  ?0.9 %  sodium chloride infusio

## 2022-01-30 ENCOUNTER — Encounter (HOSPITAL_COMMUNITY)
Admission: EM | Disposition: A | Discharge status: Home / Self Care | Payer: Self-pay | Source: Home / Self Care | Attending: Family Medicine

## 2022-01-30 ENCOUNTER — Inpatient Hospital Stay (HOSPITAL_COMMUNITY): Payer: Medicare HMO | Admitting: Anesthesiology

## 2022-01-30 ENCOUNTER — Ambulatory Visit: Payer: Medicare HMO | Admitting: Family

## 2022-01-30 ENCOUNTER — Observation Stay (HOSPITAL_COMMUNITY): Payer: Medicare HMO

## 2022-01-30 ENCOUNTER — Encounter (HOSPITAL_COMMUNITY): Payer: Self-pay | Admitting: Internal Medicine

## 2022-01-30 DIAGNOSIS — N179 Acute kidney failure, unspecified: Secondary | ICD-10-CM | POA: Diagnosis present

## 2022-01-30 DIAGNOSIS — K222 Esophageal obstruction: Secondary | ICD-10-CM

## 2022-01-30 DIAGNOSIS — F1010 Alcohol abuse, uncomplicated: Secondary | ICD-10-CM

## 2022-01-30 DIAGNOSIS — I10 Essential (primary) hypertension: Secondary | ICD-10-CM | POA: Diagnosis not present

## 2022-01-30 DIAGNOSIS — L97929 Non-pressure chronic ulcer of unspecified part of left lower leg with unspecified severity: Secondary | ICD-10-CM | POA: Diagnosis present

## 2022-01-30 DIAGNOSIS — K5731 Diverticulosis of large intestine without perforation or abscess with bleeding: Secondary | ICD-10-CM | POA: Diagnosis present

## 2022-01-30 DIAGNOSIS — K3189 Other diseases of stomach and duodenum: Secondary | ICD-10-CM

## 2022-01-30 DIAGNOSIS — I4892 Unspecified atrial flutter: Secondary | ICD-10-CM | POA: Diagnosis present

## 2022-01-30 DIAGNOSIS — I110 Hypertensive heart disease with heart failure: Secondary | ICD-10-CM | POA: Diagnosis present

## 2022-01-30 DIAGNOSIS — R9389 Abnormal findings on diagnostic imaging of other specified body structures: Secondary | ICD-10-CM | POA: Diagnosis not present

## 2022-01-30 DIAGNOSIS — I420 Dilated cardiomyopathy: Secondary | ICD-10-CM | POA: Diagnosis present

## 2022-01-30 DIAGNOSIS — D62 Acute posthemorrhagic anemia: Secondary | ICD-10-CM | POA: Diagnosis present

## 2022-01-30 DIAGNOSIS — I5042 Chronic combined systolic (congestive) and diastolic (congestive) heart failure: Secondary | ICD-10-CM | POA: Diagnosis present

## 2022-01-30 DIAGNOSIS — K641 Second degree hemorrhoids: Secondary | ICD-10-CM | POA: Diagnosis present

## 2022-01-30 DIAGNOSIS — I959 Hypotension, unspecified: Secondary | ICD-10-CM | POA: Diagnosis present

## 2022-01-30 DIAGNOSIS — K2970 Gastritis, unspecified, without bleeding: Secondary | ICD-10-CM | POA: Diagnosis not present

## 2022-01-30 DIAGNOSIS — K449 Diaphragmatic hernia without obstruction or gangrene: Secondary | ICD-10-CM

## 2022-01-30 DIAGNOSIS — D649 Anemia, unspecified: Secondary | ICD-10-CM

## 2022-01-30 DIAGNOSIS — R93421 Abnormal radiologic findings on diagnostic imaging of right kidney: Secondary | ICD-10-CM | POA: Diagnosis present

## 2022-01-30 DIAGNOSIS — K922 Gastrointestinal hemorrhage, unspecified: Secondary | ICD-10-CM | POA: Diagnosis present

## 2022-01-30 DIAGNOSIS — E871 Hypo-osmolality and hyponatremia: Secondary | ICD-10-CM | POA: Diagnosis present

## 2022-01-30 DIAGNOSIS — R739 Hyperglycemia, unspecified: Secondary | ICD-10-CM | POA: Diagnosis present

## 2022-01-30 DIAGNOSIS — I4819 Other persistent atrial fibrillation: Secondary | ICD-10-CM

## 2022-01-30 DIAGNOSIS — R93422 Abnormal radiologic findings on diagnostic imaging of left kidney: Secondary | ICD-10-CM | POA: Diagnosis present

## 2022-01-30 DIAGNOSIS — K635 Polyp of colon: Secondary | ICD-10-CM

## 2022-01-30 DIAGNOSIS — K625 Hemorrhage of anus and rectum: Secondary | ICD-10-CM | POA: Diagnosis not present

## 2022-01-30 DIAGNOSIS — E785 Hyperlipidemia, unspecified: Secondary | ICD-10-CM | POA: Diagnosis present

## 2022-01-30 DIAGNOSIS — K644 Residual hemorrhoidal skin tags: Secondary | ICD-10-CM | POA: Diagnosis present

## 2022-01-30 DIAGNOSIS — K760 Fatty (change of) liver, not elsewhere classified: Secondary | ICD-10-CM | POA: Diagnosis present

## 2022-01-30 DIAGNOSIS — K5730 Diverticulosis of large intestine without perforation or abscess without bleeding: Secondary | ICD-10-CM

## 2022-01-30 HISTORY — PX: POLYPECTOMY: SHX5525

## 2022-01-30 HISTORY — PX: BIOPSY: SHX5522

## 2022-01-30 HISTORY — PX: COLONOSCOPY: SHX5424

## 2022-01-30 HISTORY — PX: ESOPHAGOGASTRODUODENOSCOPY: SHX5428

## 2022-01-30 LAB — URINALYSIS, ROUTINE W REFLEX MICROSCOPIC
Bilirubin Urine: NEGATIVE
Glucose, UA: NEGATIVE mg/dL
Hgb urine dipstick: NEGATIVE
Ketones, ur: NEGATIVE mg/dL
Leukocytes,Ua: NEGATIVE
Nitrite: NEGATIVE
Protein, ur: NEGATIVE mg/dL
Specific Gravity, Urine: >1.046 — ABNORMAL HIGH (ref 1.005–1.030)
pH: 5 (ref 5.0–8.0)

## 2022-01-30 LAB — POCT I-STAT, CHEM 8
BUN: 24 mg/dL — ABNORMAL HIGH (ref 8–23)
Calcium, Ion: 1.09 mmol/L — ABNORMAL LOW (ref 1.15–1.40)
Chloride: 104 mmol/L (ref 98–111)
Creatinine, Ser: 1.4 mg/dL — ABNORMAL HIGH (ref 0.61–1.24)
Glucose, Bld: 118 mg/dL — ABNORMAL HIGH (ref 70–99)
HCT: 22 % — ABNORMAL LOW (ref 39.0–52.0)
Hemoglobin: 7.5 g/dL — ABNORMAL LOW (ref 13.0–17.0)
Potassium: 4.5 mmol/L (ref 3.5–5.1)
Sodium: 138 mmol/L (ref 135–145)
TCO2: 26 mmol/L (ref 22–32)

## 2022-01-30 LAB — PROTIME-INR
INR: 2.5 — ABNORMAL HIGH (ref 0.8–1.2)
Prothrombin Time: 26.6 seconds — ABNORMAL HIGH (ref 11.4–15.2)

## 2022-01-30 LAB — COMPREHENSIVE METABOLIC PANEL
ALT: 14 U/L (ref 0–44)
AST: 13 U/L — ABNORMAL LOW (ref 15–41)
Albumin: 2.5 g/dL — ABNORMAL LOW (ref 3.5–5.0)
Alkaline Phosphatase: 32 U/L — ABNORMAL LOW (ref 38–126)
Anion gap: 4 — ABNORMAL LOW (ref 5–15)
BUN: 29 mg/dL — ABNORMAL HIGH (ref 8–23)
CO2: 25 mmol/L (ref 22–32)
Calcium: 7.6 mg/dL — ABNORMAL LOW (ref 8.9–10.3)
Chloride: 106 mmol/L (ref 98–111)
Creatinine, Ser: 1.29 mg/dL — ABNORMAL HIGH (ref 0.61–1.24)
GFR, Estimated: >60 mL/min (ref >60)
Glucose, Bld: 130 mg/dL — ABNORMAL HIGH (ref 70–99)
Potassium: 4.1 mmol/L (ref 3.5–5.1)
Sodium: 135 mmol/L (ref 135–145)
Total Bilirubin: 0.6 mg/dL (ref 0.3–1.2)
Total Protein: 4.4 g/dL — ABNORMAL LOW (ref 6.5–8.1)

## 2022-01-30 LAB — HEMOGLOBIN AND HEMATOCRIT, BLOOD
HCT: 19.7 % — ABNORMAL LOW (ref 39.0–52.0)
HCT: 23.4 % — ABNORMAL LOW (ref 39.0–52.0)
Hemoglobin: 6 g/dL — CL (ref 13.0–17.0)
Hemoglobin: 7.4 g/dL — ABNORMAL LOW (ref 13.0–17.0)

## 2022-01-30 LAB — PREPARE RBC (CROSSMATCH)

## 2022-01-30 SURGERY — EGD (ESOPHAGOGASTRODUODENOSCOPY)
Anesthesia: Monitor Anesthesia Care

## 2022-01-30 MED ORDER — PROPOFOL 10 MG/ML IV BOLUS
INTRAVENOUS | Status: DC | PRN
  Administered 2022-01-30: 40 mg via INTRAVENOUS

## 2022-01-30 MED ORDER — IOHEXOL 350 MG/ML SOLN
100.0000 mL | Freq: Once | INTRAVENOUS | Status: AC | PRN
  Administered 2022-01-30: 100 mL via INTRAVENOUS

## 2022-01-30 MED ORDER — SODIUM CHLORIDE 0.9 % IV SOLN
250.0000 mg | Freq: Once | INTRAVENOUS | Status: AC
  Administered 2022-01-31: 250 mg via INTRAVENOUS
  Filled 2022-01-30: qty 20

## 2022-01-30 MED ORDER — ONDANSETRON HCL 4 MG/2ML IJ SOLN
4.0000 mg | Freq: Four times a day (QID) | INTRAMUSCULAR | Status: DC | PRN
  Administered 2022-01-30: 4 mg via INTRAVENOUS
  Filled 2022-01-30: qty 2

## 2022-01-30 MED ORDER — SODIUM CHLORIDE 0.9 % IV SOLN: INTRAVENOUS | Status: DC

## 2022-01-30 MED ORDER — SODIUM CHLORIDE 0.9 % IV SOLN: INTRAVENOUS | Status: DC | PRN

## 2022-01-30 MED ORDER — SODIUM CHLORIDE (PF) 0.9 % IJ SOLN
INTRAMUSCULAR | Status: AC
  Filled 2022-01-30: qty 50

## 2022-01-30 MED ORDER — SODIUM CHLORIDE 0.9% IV SOLUTION: Freq: Once | INTRAVENOUS | Status: AC

## 2022-01-30 MED ORDER — LIDOCAINE 2% (20 MG/ML) 5 ML SYRINGE
INTRAMUSCULAR | Status: DC | PRN
  Administered 2022-01-30: 40 mg via INTRAVENOUS

## 2022-01-30 MED ORDER — PROPOFOL 500 MG/50ML IV EMUL
INTRAVENOUS | Status: DC | PRN
Start: 2022-01-30 — End: 2022-01-30
  Administered 2022-01-30: 125 ug/kg/min via INTRAVENOUS

## 2022-01-30 MED ORDER — LACTATED RINGERS IV SOLN
INTRAVENOUS | Status: AC | PRN
  Administered 2022-01-30: 1000 mL via INTRAVENOUS

## 2022-01-30 NOTE — Anesthesia Preprocedure Evaluation (Addendum)
Anesthesia Evaluation  ?Patient identified by MRN, date of birth, ID band ?Patient awake ? ? ? ?Reviewed: ?Allergy & Precautions, NPO status , Patient's Chart, lab work & pertinent test results ? ?History of Anesthesia Complications ?Negative for: history of anesthetic complications ? ?Airway ?Mallampati: II ? ?TM Distance: >3 FB ?Neck ROM: Full ? ? ? Dental ? ?(+) Dental Advisory Given, Teeth Intact,  ?  ?Pulmonary ?neg pulmonary ROS,  ?  ?breath sounds clear to auscultation ? ? ? ? ? ? Cardiovascular ?hypertension, Pt. on medications ?+CHF  ?+ dysrhythmias Atrial Fibrillation  ?Rhythm:Irregular Rate:Tachycardia ? ?1. Left ventricular ejection fraction, by estimation, is 45 to 50%. The  ?left ventricle has mildly decreased function. The left ventricle has no  ?regional wall motion abnormalities. There is mild concentric left  ?ventricular hypertrophy. Left ventricular  ?diastolic function could not be evaluated.  ??2. Right ventricular systolic function is mildly reduced. The right  ?ventricular size is normal. Tricuspid regurgitation signal is inadequate  ?for assessing PA pressure.  ??3. Left atrial size was moderately dilated.  ??4. Right atrial size was moderately dilated.  ??5. The aortic valve is tricuspid. Aortic valve regurgitation is not  ?visualized. No aortic stenosis is present.  ?  ?Neuro/Psych ?  ? GI/Hepatic ?  ?Endo/Other  ? ? Renal/GU ?  ? ?  ?Musculoskeletal ? ? Abdominal ?  ?Peds ? Hematology ? ?(+) Blood dyscrasia, anemia , Lab Results ?     Component                Value               Date                 ?     WBC                      6.3                 01/29/2022           ?     HGB                      6.0 (LL)            01/30/2022           ?     HCT                      19.7 (L)            01/30/2022           ?     MCV                      82.7                01/29/2022           ?     PLT                      286                 01/29/2022           ?    ?Anesthesia Other Findings ? ? Reproductive/Obstetrics ? ?  ? ? ? ? ? ? ? ? ? ? ? ? ? ?  ?  ? ? ? ? ? ? ? ?  Anesthesia Physical ?Anesthesia Plan ? ?ASA: 3 ? ?Anesthesia Plan: MAC  ? ?Post-op Pain Management: Minimal or no pain anticipated  ? ?Induction: Intravenous ? ?PONV Risk Score and Plan: Propofol infusion ? ?Airway Management Planned: Simple Face Mask, Nasal Cannula and Natural Airway ? ?Additional Equipment: None ? ?Intra-op Plan:  ? ?Post-operative Plan:  ? ?Informed Consent: I have reviewed the patients History and Physical, chart, labs and discussed the procedure including the risks, benefits and alternatives for the proposed anesthesia with the patient or authorized representative who has indicated his/her understanding and acceptance.  ? ? ? ?Dental advisory given ? ?Plan Discussed with: CRNA ? ?Anesthesia Plan Comments: (Possible need to intubate discussed with patient)  ? ? ? ? ? ? ?Anesthesia Quick Evaluation ? ?

## 2022-01-30 NOTE — Op Note (Signed)
[facility] ?Patient Name: [name] ?Procedure Date: 01/30/2022 ?MRN: [id number] ?Attending MD: Justice Britain , MD ?Date of Birth: 1956/07/04 ?CSN: [id number] ?Age: 66 ?Admit Type: Outpatient ?Procedure:                Colonoscopy ?Indications:              Hematochezia, Rectal bleeding, Acute post  ?                          hemorrhagic anemia ?Providers:                Justice Britain, MD, Cathlean Marseilles, Justina  ?                          Purcell Nails, Merchant [employer] officer, Glenis Smoker, CRNA ?Referring MD:             Pricilla Riffle. Fuller Plan, MD, [facility] ?Medicines:                Monitored Anesthesia Care ?Complications:            No immediate complications. ?Estimated Blood Loss:     Estimated blood loss was minimal. ?Procedure:                Pre-Anesthesia Assessment: ?                          - Prior to the procedure, a History and Physical  ?                          was performed, and patient medications and  ?                          allergies were reviewed. The patient's tolerance of  ?                          previous anesthesia was also reviewed. The risks  ?                          and benefits of the procedure and the sedation  ?                          options and risks were discussed with the patient.  ?                          All questions were answered, and informed consent  ?                          was obtained. Prior Anticoagulants: The patient has  ?                          taken Coumadin (warfarin), last dose was 2 days  ?                          prior to procedure. ASA Grade Assessment: III - A  ?  patient with severe systemic disease. After  ?                          reviewing the risks and benefits, the patient was  ?                          deemed in satisfactory condition to undergo the  ?                          procedure. ?                          After obtaining informed consent, the colonoscope  ?                           was passed under direct vision. Throughout the  ?                          procedure, the patient's blood pressure, pulse, and  ?                          oxygen saturations were monitored continuously. The  ?                          [id number]-HQ190L ([id number]) Olympus colonoscope was  ?                          introduced through the anus and advanced to the 5  ?                          cm into the ileum. After obtaining informed  ?                          consent, the colonoscope was passed under direct  ?                          vision. Throughout the procedure, the patient's  ?                          blood pressure, pulse, and oxygen saturations were  ?                          monitored continuously.The colonoscopy was  ?                          performed without difficulty. The patient tolerated  ?                          the procedure. The quality of the bowel preparation  ?                          was good. The terminal ileum, ileocecal valve,  ?  appendiceal orifice, and rectum were photographed. ?Scope In: 3:46:50 PM ?Scope Out: 4:08:04 PM ?Scope Withdrawal Time: 0 hours 15 minutes 56 seconds  ?Total Procedure Duration: 0 hours 21 minutes 14 seconds  ?Findings: ?     The digital rectal exam findings include hemorrhoids. Pertinent  ?     negatives include no palpable rectal lesions. ?     The terminal ileum and ileocecal valve appeared normal. ?     A 3 mm polyp was found in the ascending colon. The polyp was sessile.  ?     The polyp was removed with a cold snare. Resection and retrieval were  ?     complete. ?     Clotted blood was found in the recto-sigmoid colon and in the sigmoid  ?     colon. Lavage of the area was performed using copious amounts, resulting  ?     in clearance with good visualization. ?     Many small and large-mouthed diverticula were found in the entire colon. ?     Normal mucosa was found in the entire colon otherwise. ?     Non-bleeding non-thrombosed  external and internal hemorrhoids were found  ?     during retroflexion, during perianal exam and during digital exam. The  ?     hemorrhoids were Grade II (internal hemorrhoids that prolapse but reduce  ?     spontaneously). ?Impression:               - Hemorrhoids found on digital rectal exam. ?                          - The examined portion of the ileum was normal. ?                          - One 3 mm polyp in the ascending colon, removed  ?                          with a cold snare. Resected and retrieved. ?                          - Blood in the recto-sigmoid colon and in the  ?                          sigmoid colon. ?                          - Diverticulosis in the entire examined colon. ?                          - Normal mucosa in the entire examined colon  ?                          otherwise. ?                          - Non-bleeding non-thrombosed external and internal  ?                          hemorrhoids. ?                          -  Etiology of bleeding seems to have been most  ?                          likely diverticular in origin. ?Moderate Sedation: ?     Not Applicable - Patient had care per Anesthesia. ?Recommendation:           - The patient will be observed post-procedure,  ?                          until all discharge criteria are met. ?                          - Discharge patient to home. ?                          - Patient has a contact number available for  ?                          emergencies. The signs and symptoms of potential  ?                          delayed complications were discussed with the  ?                          patient. Return to normal activities tomorrow.  ?                          Written discharge instructions were provided to the  ?                          patient. ?                          - High fiber diet. ?                          - Use FiberCon 1-2 tablets PO daily. ?                          - Continue present medications. ?                           - Await pathology results. ?                          - Repeat colonoscopy based on pathology likely in  ?                          5-years due to previous history. ?                          - Monitor overnight and hopeful discharge in next  ?                          24 hours. ?                          -  May restart Coumadin in 2-days. ?                          - The findings and recommendations were discussed  ?                          with the patient. ?                          - The findings and recommendations were discussed  ?                          with the referring physician. ?Procedure Code(s):        --- Professional --- ?                          [phone], Colonoscopy, flexible; with removal of  ?                          tumor(s), polyp(s), or other lesion(s) by snare  ?                          technique ?Diagnosis Code(s):        --- Professional --- ?                          K64.1, Second degree hemorrhoids ?                          K63.5, Polyp of colon ?                          K92.2, Gastrointestinal hemorrhage, unspecified ?                          K92.1, Melena (includes Hematochezia) ?                          K62.5, Hemorrhage of anus and rectum ?                          D62, Acute posthemorrhagic anemia ?                          K57.30, Diverticulosis of large intestine without  ?                          perforation or abscess without bleeding ?CPT copyright 2019 American Medical Association. All rights reserved. ?The codes documented in this report are preliminary and upon coder review may  ?be revised to meet current compliance requirements. ?Justice Britain, MD ?01/30/2022 4:36:36 PM ?Number of Addenda: 0 ?

## 2022-01-30 NOTE — Op Note (Signed)
[facility] ?Patient Name: [name] ?Procedure Date: 01/30/2022 ?MRN: [id number] ?Attending MD: Justice Britain , MD ?Date of Birth: 1956-06-13 ?CSN: [id number] ?Age: 66 ?Admit Type: Outpatient ?Procedure:                Upper GI endoscopy ?Indications:              Acute post hemorrhagic anemia, Hematochezia ?Providers:                Justice Britain, MD, Cathlean Marseilles, Justina  ?                          Purcell Nails, Merchant navy officer, Glenis Smoker, CRNA ?Referring MD:             Pricilla Riffle. Fuller Plan, MD, [facility] ?Medicines:                Monitored Anesthesia Care ?Complications:            No immediate complications. ?Estimated Blood Loss:     Estimated blood loss was minimal. ?Procedure:                Pre-Anesthesia Assessment: ?                          - Prior to the procedure, a History and Physical  ?                          was performed, and patient medications and  ?                          allergies were reviewed. The patient's tolerance of  ?                          previous anesthesia was also reviewed. The risks  ?                          and benefits of the procedure and the sedation  ?                          options and risks were discussed with the patient.  ?                          All questions were answered, and informed consent  ?                          was obtained. Prior Anticoagulants: The patient has  ?                          taken Coumadin (warfarin), last dose was 2 days  ?                          prior to procedure. ASA Grade Assessment: III - A  ?                          patient with severe systemic disease. After  ?  reviewing the risks and benefits, the patient was  ?                          deemed in satisfactory condition to undergo the  ?                          procedure. ?                          After obtaining informed consent, the endoscope was  ?                          passed under direct vision.  Throughout the  ?                          procedure, the patient's blood pressure, pulse, and  ?                          oxygen saturations were monitored continuously. The  ?                          [id number]-[id number] ([id number] Olympus endoscope was introduced  ?                          through the mouth, and advanced to the second part  ?                          of duodenum. The upper GI endoscopy was  ?                          accomplished without difficulty. The patient  ?                          tolerated the procedure. ?Scope In: ?Scope Out: ?Findings: ?     No gross lesions were noted in the entire esophagus. ?     A non-obstructing Schatzki ring was found at the gastroesophageal  ?     junction. ?     The Z-line was regular and was found 42 cm from the incisors. ?     A 3 cm hiatal hernia was present. ?     Patchy mildly erythematous mucosa without bleeding was found in the  ?     entire examined stomach. Biopsies were taken with a cold forceps for  ?     histology and Helicobacter pylori testing. ?     No gross lesions were noted in the duodenal bulb, in the first portion  ?     of the duodenum and in the second portion of the duodenum. ?Impression:               - No gross lesions in esophagus. Non-obstructing  ?                          Schatzki ring. Z-line regular, 42 cm from the  ?  incisors. ?                          - 3 cm hiatal hernia. Erythematous mucosa in the  ?                          stomach. Biopsied. ?                          - No gross lesions in the duodenal bulb, in the  ?                          first portion of the duodenum and in the second  ?                          portion of the duodenum. ?Moderate Sedation: ?     Not Applicable - Patient had care per Anesthesia. ?Recommendation:           - Proceed to scheduled colonoscopy. ?                          - Observe patient's clinical course. ?                          - Await pathology results. ?                           - The findings and recommendations were discussed  ?                          with the patient. ?                          - The findings and recommendations were discussed  ?                          with the referring physician. ?Procedure Code(s):        --- Professional --- ?                          [phone], Esophagogastroduodenoscopy, flexible,  ?                          transoral; with biopsy, single or multiple ?Diagnosis Code(s):        --- Professional --- ?                          K22.2, Esophageal obstruction ?                          K44.9, Diaphragmatic hernia without obstruction or  ?                          gangrene ?                          K31.89, Other diseases of stomach and duodenum ?  D62, Acute posthemorrhagic anemia ?                          K92.1, Melena (includes Hematochezia) ?CPT copyright 2019 American Medical Association. All rights reserved. ?The codes documented in this report are preliminary and upon coder review may  ?be revised to meet current compliance requirements. ?Justice Britain, MD ?01/30/2022 4:27:09 PM ?Number of Addenda: 0 ?

## 2022-01-30 NOTE — Progress Notes (Signed)
? ? Progress Note ? ? Subjective  ?Chief Complaint:GI bleed ? ?Patient with continuing large volume hematochezia, getting blood products now. Most recent episodes less than a hour ago.  ?Has some tachycardia, no hypotension but continuing dizziness with standing. No CP/SOB.  ? ? ? ? Objective  ? ?Vital signs in last 24 hours: ?Temp:  [97.8 ?F (36.6 ?C)-99.6 ?F (37.6 ?C)] 98.6 ?F (37 ?C) (03/31 6314) ?Pulse Rate:  [59-122] 119 (03/31 0812) ?Resp:  [16-20] 20 (03/31 9702) ?BP: (92-145)/(60-101) 124/98 (03/31 6378) ?SpO2:  [94 %-100 %] 100 % (03/31 0812) ?Weight:  [104.8 kg] 104.8 kg (03/31 0500) ?Last BM Date : 01/30/22 ? ?General:   male in no acute distress  ?Heart:  Regular rate and rhythm; no murmurs ?Pulm: Clear anteriorly; no wheezing ?Abdomen:  Soft, Obese AB, Active bowel sounds. No tenderness . , No organomegaly appreciated. ?Extremities:  without  edema. ?Neurologic:  Alert and  oriented x4;  No focal deficits.  ?Psych:  Cooperative. Normal mood and affect. ? ? ?Intake/Output from previous day: ?03/30 0701 - 03/31 0700 ?In: 806.1 [P.O.:360; I.V.:116.1; Blood:330] ?Out: -  ?Intake/Output this shift: ?No intake/output data recorded. ? ?Lab Results: ?Recent Labs  ?  01/29/22 ?1008 01/29/22 ?1626 01/29/22 ?2243 01/30/22 ?0357  ?WBC 6.3  --   --   --   ?HGB 7.7* 8.2* 7.4* 6.0*  ?HCT 25.3* 26.8* 24.5* 19.7*  ?PLT 286  --   --   --   ? ?BMET ?Recent Labs  ?  01/29/22 ?1008 01/30/22 ?0357  ?NA 132* 135  ?K 4.4 4.1  ?CL 100 106  ?CO2 26 25  ?GLUCOSE 161* 130*  ?BUN 34* 29*  ?CREATININE 1.53* 1.29*  ?CALCIUM 7.9* 7.6*  ? ?LFT ?Recent Labs  ?  01/30/22 ?0357  ?PROT 4.4*  ?ALBUMIN 2.5*  ?AST 13*  ?ALT 14  ?ALKPHOS 32*  ?BILITOT 0.6  ? ?PT/INR ?Recent Labs  ?  01/29/22 ?1008 01/30/22 ?0357  ?LABPROT 29.4* 26.6*  ?INR 2.8* 2.5*  ? ? ?Studies/Results: ?US Abdomen Complete ? ?Result Date: 01/29/2022 ?CLINICAL DATA:  Acute kidney injury EXAM: ABDOMEN ULTRASOUND COMPLETE COMPARISON:  CT chest abdomen pelvis 01/03/2019  FINDINGS: Gallbladder: No gallstones or wall thickening visualized. Mild gallbladder sludge. No sonographic Murphy sign noted by sonographer. Common bile duct: Diameter: 7.1 mm Liver: 2.5 cm cyst left lobe liver unchanged from prior CT. Increased echogenicity liver parenchyma diffusely. Portal vein is patent on color Doppler imaging with normal direction of blood flow towards the liver. IVC: No abnormality visualized. Pancreas: Visualized portion unremarkable. Pancreatic duct upper normal 2.6 mm Spleen: Size and appearance within normal limits. Right Kidney: Length: 11.6 cm in length. Negative for hydronephrosis. Echogenic focus in the cortex measuring 1 cm. Probable fat in angiomyolipoma. This is faintly visualized on the prior CT. Left Kidney: Length: 11.5 cm in length. Echogenicity within normal limits. No mass or hydronephrosis visualized. Abdominal aorta: No aneurysm visualized. Other findings: None. IMPRESSION: Negative for gallstones or biliary dilatation Echogenic liver compatible fatty infiltration. 2.5 cm cyst left lobe liver 1 cm echogenic lesion in the cortex of the right kidney. This may represent a small angiomyolipoma Electronically Signed   By: Franchot Gallo M.D.   On: 01/29/2022 13:05   ? ? ? Impression/Plan:  ? ?GI bleed with some hypotension/soft BPs- tachycardia ?In the setting of Coumadin- most recent INr is 2.5 ?CBC on 01/29/2022   ?WBC 6.3 HGB 6.0 MCV 82.7 Platelets 286- getting 1st unit right now, has second one  he will receive ?Had increase in BUN and creatinine compared to 3 weeks ago ?09/17/2017 colonoscopy  Multiple diverticula sigmoid, descending and transverse colon.  No evidence of diverticular bleeding.  Internal hemorrhoid grade 1, 2 hyperplastic polyps.   ?Recall end of this year.  ?Has never had EGD. ?Continuing GI bleed, painless large volume hematochezia, will schedule for CT angio with IR consult if positive.  ?Almost done with prep, pending CTA results will proceed with  colon/EGD if negative.  ?NPO at 11 ?  ?Normocytic acute on chronic anemia ?Anemia studies on 01/29/2022  ?Iron 28 Ferritin 18 B12 214 ?Suggest B12 1000 mcg daily ?Can do iron infusion before leaving hospital  ?With iron def, suggest colon and EGD ?  ?A Fibrillation on Coumadin ?Last INR 2.5, on hold at this time.  ?Can consider vitamin K or FFP is continues to have large volume bleeding requiring blood products.  ?  ?Chronic combined systolic and diastolic heart failure ?08/08/2021 echocardiogram 45 to 50% ?Monitor for hypervolemia ? ?Future Appointments  ?Date Time Provider [place]  ?02/02/2022  1:45 PM Newt Minion, MD [facility] None  ?03/26/2022  2:00 PM [facility]  ? ? ? ? LOS: 0 days  ? ?Vladimir Crofts  01/30/2022, 9:48 AM ? ? ? ?

## 2022-01-30 NOTE — Assessment & Plan Note (Signed)
-   CTA abdomen shows perinephric fat stranding around kidneys; could be acute or chronic ?-We will obtain UA to check for infection ?

## 2022-01-30 NOTE — Transfer of Care (Signed)
Immediate Anesthesia Transfer of Care Note ? ?Patient: AVONTAE BURKHEAD ? ?Procedure(s) Performed: ESOPHAGOGASTRODUODENOSCOPY (EGD) ?COLONOSCOPY ? ?Patient Location: PACU and Endoscopy Unit ? ?Anesthesia Type:MAC ? ?Level of Consciousness: awake and alert  ? ?Airway & Oxygen Therapy: Patient Spontanous Breathing and Patient connected to face mask oxygen ? ?Post-op Assessment: Report given to RN and Post -op Vital signs reviewed and stable ? ?Post vital signs: Reviewed and stable ? ?Last Vitals:  ?Vitals Value Taken Time  ?BP 149/86 01/30/22 1625  ?Temp    ?Pulse 113 01/30/22 1625  ?Resp 20 01/30/22 1626  ?SpO2 84 % 01/30/22 1625  ?Vitals shown include unvalidated device data. ? ?Last Pain:  ?Vitals:  ? 01/30/22 1433  ?TempSrc: Temporal  ?PainSc: 0-No pain  ?   ? ?Patients Stated Pain Goal: 4 (01/30/22 0750) ? ?Complications: No notable events documented. ?

## 2022-01-30 NOTE — Progress Notes (Signed)
[facility] ? ?PROGRESS NOTE ? ?[name] OFB:[id number] DOB: 11/17/1955 DOA: 01/29/2022 ?PCP: [name], MD ? ?Brief hospital course ? ?66 year old male with medical history of atrial fibrillation on Coumadin, chronic systolic heart failure, hypertension, hyperlipidemia presented with rectal bleeding.  Gastroenterology was consulted.  ? ? ? ?Subjective  ? ?Patient seen and examined, awaiting to go for colonoscopy today ? ?  ? ?Assessment and Plan: ? ?* GIB (gastrointestinal bleeding) ?- Presented with lower GI bleed ?-Still having bloody stools, likely old clotted blood ?-Hemoglobin was 6.0 this morning; 2 units PRBC ordered ?-Gastroenterology has been consulted; plan for colonoscopy today ? ?Abnormal finding on imaging ?- CTA abdomen shows perinephric fat stranding around kidneys; could be acute or chronic ?-We will obtain UA to check for infection ? ?Symptomatic anemia ?- Secondary to lower GI bleed ?-Hemoglobin was 6.0 this morning ?-2 units PRBC ordered ?-Repeat hemoglobin 7.5 ? ?AKI (acute kidney injury) ([place]) ?- Patient presented with acute kidney injury, creatinine was 1.53 ?-Improved to 1.40 with IV fluids and blood transfusion ?-Continue to hold Aldactone, torsemide ?-Follow BMP in am ? ?Alcohol abuse ?- No signs and symptoms of alcohol withdrawal ?-Started on CIWA protocol with Ativan ?-Abdominal ultrasound shows fatty liver ? ?Hyponatremia ?- Resolved ? ?Persistent atrial fibrillation ([place]) ?- Patient is on Coumadin; currently on hold due to GI bleed ?-Metoprolol is currently on hold ?-We will restart metoprolol after colonoscopy ? ?Essential hypertension ?- We will restart metoprolol ?-Cozaar, Aldactone, torsemide are currently on hold ? ? ? ? ?  ?Medications ? ?  ? [MAR Hold] ALPRAZolam  0.5 mg Oral BID  ? [MAR Hold] atorvastatin  20 mg Oral q morning  ? [MAR Hold] folic acid  1 mg Oral Daily  ? [MAR Hold] melatonin  10 mg Oral QHS  ? [MAR Hold] multivitamin with minerals  1 tablet Oral  Daily  ? [MAR Hold] pantoprazole (PROTONIX) IV  40 mg Intravenous Q24H  ? sodium chloride (PF)      ? [MAR Hold] thiamine  100 mg Oral Daily  ? Or  ? [MAR Hold] thiamine  100 mg Intravenous Daily  ? ? ? Data Reviewed:  ? ?CBG: ? ?No results for input(s): GLUCAP in the last 168 hours. ? ?SpO2: 100 %  ? ? ?Vitals:  ? 01/30/22 1133 01/30/22 1257 01/30/22 1330 01/30/22 1433  ?BP: (!) 137/92 (!) 141/96 (!) 142/102 (!) 165/101  ?Pulse: (!) 114 (!) 116 (!) 115 (!) 116  ?Resp: '20 18 20 17  '$ ?Temp: 98.7 ?F (37.1 ?C) 98.8 ?F (37.1 ?C) 98.8 ?F (37.1 ?C) 98.1 ?F (36.7 ?C)  ?TempSrc: Oral Oral Oral Temporal  ?SpO2: 97% 100% 100% 100%  ?Weight:    102.1 kg  ?Height:    '6\' 3"'$  (1.905 m)  ? ? ? ? ?Data Reviewed: ? ?Basic Metabolic Panel: ?Recent Labs  ?Lab 01/29/22 ?1008 01/29/22 ?1626 01/30/22 ?0357 01/30/22 ?1439  ?NA 132*  --  135 138  ?K 4.4  --  4.1 4.5  ?CL 100  --  106 104  ?CO2 26  --  25  --   ?GLUCOSE 161*  --  130* 118*  ?BUN 34*  --  29* 24*  ?CREATININE 1.53*  --  1.29* 1.40*  ?CALCIUM 7.9*  --  7.6*  --   ?MG  --  2.1  --   --   ? ? ?CBC: ?Recent Labs  ?Lab 01/29/22 ?1008 01/29/22 ?1626 01/29/22 ?2243 01/30/22 ?0357 01/30/22 ?1439  ?WBC 6.3  --   --   --   --   ?  HGB 7.7* 8.2* 7.4* 6.0* 7.5*  ?HCT 25.3* 26.8* 24.5* 19.7* 22.0*  ?MCV 82.7  --   --   --   --   ?PLT 286  --   --   --   --   ? ? ?LFT ?Recent Labs  ?Lab 01/29/22 ?1008 01/30/22 ?0357  ?AST 17 13*  ?ALT 15 14  ?ALKPHOS 42 32*  ?BILITOT 0.6 0.6  ?PROT 5.4* 4.4*  ?ALBUMIN 3.1* 2.5*  ? ?  ?Micro :  ? ? ? ?Antibiotics: ?Anti-infectives (From admission, onward)  ? ? None  ? ?  ? ? ? ? ? ?DVT prophylaxis: SCDs ? ?Code Status: Full code ? ?Family Communication: No family at bedside ? ?CONSULTS: Gastroenterology ? ? ? ?Objective  ? ? ?Physical Examination: ? ? ?General-appears in no acute distress ?Heart-S1-S2, regular, no murmur auscultated ?Lungs-clear to auscultation bilaterally, no wheezing or crackles auscultated ?Abdomen-soft, nontender, no  organomegaly ?Extremities-no edema in the lower extremities ?Neuro-alert, oriented x3, no focal deficit noted ? ? ?Status is: Inpatient:  rectal bleeding ? ? ? ?  ? ? ?Oswald Hillock ?  ?[facility] ?If 7PM-7AM, please contact night-coverage at [email or web address], ?Office  [phone] ? ? ?01/30/2022, 3:44 PM  LOS: 0 days  ? ? ? ? ? ? ? ? ? ? ?  ?

## 2022-01-30 NOTE — Assessment & Plan Note (Signed)
-   Secondary to lower GI bleed ?-Hemoglobin was 6.0 this morning ?-2 units PRBC ordered ?-Repeat hemoglobin 7.5 ?

## 2022-01-30 NOTE — Assessment & Plan Note (Addendum)
-   Patient is on Coumadin; currently on hold due to GI bleed ?-Metoprolol is currently on hold ?-We will restart metoprolol after colonoscopy ?

## 2022-01-30 NOTE — Assessment & Plan Note (Addendum)
-   Patient presented with acute kidney injury, creatinine was 1.53 ?-Improved to 1.40 with IV fluids and blood transfusion ?-Continue to hold Aldactone, torsemide ?-Follow BMP in am ?

## 2022-01-30 NOTE — TOC Progression Note (Signed)
Transition of Care (TOC) - Progression Note  ? ? ?Patient Details  ?Name: [name] ?MRN: [id number] ?Date of Birth: 10-29-1956 ? ?Transition of Care (TOC) CM/SW Contact  ?Purcell Mouton, RN ?Phone Number: ?01/30/2022, 1:28 PM ? ?Clinical Narrative:    ? ?Pt from home and plan to return home. There are no needs at present time.  ? ?Expected Discharge Plan: Home/Self Care ?Barriers to Discharge: No Barriers Identified ? ?Expected Discharge Plan and Services ?Expected Discharge Plan: Home/Self Care ?  ?  ?  ?Living arrangements for the past 2 months: [place] ?                ?  ?  ?  ?  ?  ?  ?  ?  ?  ?  ? ? ?Social Determinants of Health (SDOH) Interventions ?  ? ?Readmission Risk Interventions ?   ? View : No data to display.  ?  ?  ?  ? ? ?

## 2022-01-30 NOTE — Assessment & Plan Note (Signed)
-   Presented with lower GI bleed ?-Still having bloody stools, likely old clotted blood ?-Hemoglobin was 6.0 this morning; 2 units PRBC ordered ?-Gastroenterology has been consulted; plan for colonoscopy today ?

## 2022-01-30 NOTE — Progress Notes (Signed)
Patient had one large loose bowel movement dark red then became nauseous and vomited previusly ingested Golytely. Now the patient refuses to take the rest of the bowel prep med. Dr. Modena Nunnery paged.  ?

## 2022-01-30 NOTE — Hospital Course (Signed)
66 year old male with medical history of atrial fibrillation on Coumadin, chronic systolic heart failure, hypertension, hyperlipidemia presented with rectal bleeding.  Gastroenterology was consulted. ?

## 2022-01-30 NOTE — Assessment & Plan Note (Addendum)
-   No signs and symptoms of alcohol withdrawal ?-Started on CIWA protocol with Ativan ?-Abdominal ultrasound shows fatty liver ?

## 2022-01-30 NOTE — Significant Event (Signed)
Rapid Response Event Note  ? ?Reason for Call :  ?HR 190's ? ?Initial Focused Assessment:  ?Patient alert & oriented but just vomited Golytely when got up to [facility]. Patient is going through withdrawal from ETOH and has related CIWA orders.  ? ?Interventions:  ?Patient coached on vagal maneuvers that were effective at lowering HR to 100-120. Ativan administered per CIWA protocol. Educated patient about need for hourly monitoring for withdrawal.  ?Communicated with on call provider. ?Plan of Care:  ?Utilize CIWA to max need and call back to rapid nurse if any worsening or lack of improvement. ? ? ?Event Summary:  ? ?MD Notified: Gershon Cull, NP  ?Call Time: [id number] ?Arrival Time: 61 ?End Time: 0130 ? ?Selinda Michaels, RN ?

## 2022-01-30 NOTE — Anesthesia Procedure Notes (Signed)
Procedure Name: [place] ?Date/Time: 01/30/2022 3:15 PM ?Performed by: Cynda Familia, CRNA ?Pre-anesthesia Checklist: Patient identified, Emergency Drugs available, Suction available, Patient being monitored and Timeout performed ?Patient Re-evaluated:Patient Re-evaluated prior to induction ?Oxygen Delivery Method: Simple face mask ?Placement Confirmation: positive ETCO2 and breath sounds checked- equal and bilateral ?Dental Injury: Teeth and Oropharynx as per pre-operative assessment  ? ? ? ? ?

## 2022-01-30 NOTE — Assessment & Plan Note (Signed)
-   We will restart metoprolol ?-Cozaar, Aldactone, torsemide are currently on hold ?

## 2022-01-30 NOTE — Assessment & Plan Note (Signed)
Resolved

## 2022-01-31 DIAGNOSIS — I4819 Other persistent atrial fibrillation: Secondary | ICD-10-CM | POA: Diagnosis not present

## 2022-01-31 DIAGNOSIS — D62 Acute posthemorrhagic anemia: Secondary | ICD-10-CM

## 2022-01-31 DIAGNOSIS — K625 Hemorrhage of anus and rectum: Secondary | ICD-10-CM | POA: Diagnosis not present

## 2022-01-31 DIAGNOSIS — F1010 Alcohol abuse, uncomplicated: Secondary | ICD-10-CM | POA: Diagnosis not present

## 2022-01-31 DIAGNOSIS — K2970 Gastritis, unspecified, without bleeding: Secondary | ICD-10-CM

## 2022-01-31 DIAGNOSIS — R9389 Abnormal findings on diagnostic imaging of other specified body structures: Secondary | ICD-10-CM | POA: Diagnosis not present

## 2022-01-31 DIAGNOSIS — K5731 Diverticulosis of large intestine without perforation or abscess with bleeding: Principal | ICD-10-CM

## 2022-01-31 LAB — CBC
HCT: 20.9 % — ABNORMAL LOW (ref 39.0–52.0)
Hemoglobin: 6.7 g/dL — CL (ref 13.0–17.0)
MCH: 27.2 pg (ref 26.0–34.0)
MCHC: 32.1 g/dL (ref 30.0–36.0)
MCV: 85 fL (ref 80.0–100.0)
Platelets: 206 10*3/uL (ref 150–400)
RBC: 2.46 MIL/uL — ABNORMAL LOW (ref 4.22–5.81)
RDW: 16.7 % — ABNORMAL HIGH (ref 11.5–15.5)
WBC: 7.3 10*3/uL (ref 4.0–10.5)
nRBC: 0.3 % — ABNORMAL HIGH (ref 0.0–0.2)

## 2022-01-31 LAB — HEMOGLOBIN AND HEMATOCRIT, BLOOD
HCT: 23.4 % — ABNORMAL LOW (ref 39.0–52.0)
Hemoglobin: 7.6 g/dL — ABNORMAL LOW (ref 13.0–17.0)

## 2022-01-31 LAB — BASIC METABOLIC PANEL
Anion gap: 3 — ABNORMAL LOW (ref 5–15)
BUN: 26 mg/dL — ABNORMAL HIGH (ref 8–23)
CO2: 27 mmol/L (ref 22–32)
Calcium: 7.8 mg/dL — ABNORMAL LOW (ref 8.9–10.3)
Chloride: 106 mmol/L (ref 98–111)
Creatinine, Ser: 1.33 mg/dL — ABNORMAL HIGH (ref 0.61–1.24)
GFR, Estimated: 59 mL/min — ABNORMAL LOW (ref >60)
Glucose, Bld: 121 mg/dL — ABNORMAL HIGH (ref 70–99)
Potassium: 4 mmol/L (ref 3.5–5.1)
Sodium: 136 mmol/L (ref 135–145)

## 2022-01-31 LAB — PREPARE RBC (CROSSMATCH)

## 2022-01-31 MED ORDER — PANTOPRAZOLE SODIUM 40 MG PO TBEC: 40.0000 mg | DELAYED_RELEASE_TABLET | Freq: Every day | ORAL | 3 refills | Status: DC

## 2022-01-31 MED ORDER — SODIUM CHLORIDE 0.9% IV SOLUTION: Freq: Once | INTRAVENOUS | Status: AC

## 2022-01-31 MED ORDER — FERROUS GLUCONATE 324 (38 FE) MG PO TABS: 324.0000 mg | ORAL_TABLET | Freq: Every day | ORAL | 3 refills | Status: DC

## 2022-01-31 MED ORDER — PANTOPRAZOLE SODIUM 40 MG PO TBEC: 40.0000 mg | DELAYED_RELEASE_TABLET | Freq: Every day | ORAL | Status: DC

## 2022-01-31 MED ORDER — DIPHENHYDRAMINE HCL 25 MG PO CAPS
25.0000 mg | ORAL_CAPSULE | Freq: Four times a day (QID) | ORAL | Status: DC | PRN
  Administered 2022-01-31: 25 mg via ORAL
  Filled 2022-01-31: qty 1

## 2022-01-31 NOTE — Progress Notes (Signed)
AVS given to patient and explained at the bedside. Medications and follow up appointments have been explained with pt verbalizing understanding.  

## 2022-01-31 NOTE — TOC Progression Note (Signed)
Transition of Care (TOC) - Progression Note  ? ? ?Patient Details  ?Name: [name] ?MRN: [id number] ?Date of Birth: 11/11/1955 ? ?Transition of Care (TOC) CM/SW Contact  ?Tawanna Cooler, RN ?Phone Number: ?01/31/2022, 11:11 AM ? ?Clinical Narrative:    ? ?TOC CM received a consult for substance abuse resources for patient.  Spoke with patient at the bedside.  He declined any resources, states he's doing well.   ? ? ?Expected Discharge Plan: Home/Self Care ?Barriers to Discharge: No Barriers Identified ? ?Expected Discharge Plan and Services ?Expected Discharge Plan: Home/Self Care ?  ?  ?  ?Living arrangements for the past 2 months: [place] ?                ?  ?

## 2022-01-31 NOTE — Discharge Summary (Signed)
?Physician Discharge Summary ?  ?Patient: [name] MRN: [id number] DOB: 01/15/1956  ?Admit date:     01/29/2022  ?Discharge date: 01/31/22  ?Discharge Physician: Oswald Hillock  ? ?PCP: [name], MD  ? ?Recommendations at discharge:  ? ?Follow-up gastroenterology in 1 week ?Follow-up cardiology to discuss taking both aspirin and Coumadin.  If no indication, aspirin should be stopped due to GI bleed. ? ?Discharge Diagnoses: ?Principal Problem: ?  GIB (gastrointestinal bleeding) ?Active Problems: ?  Essential hypertension ?  Persistent atrial fibrillation ([place]) ?  Chronic systolic (congestive) heart failure ([facility]) ?  Hyperlipidemia ?  Hyponatremia ?  Hypocalcemia ?  Alcohol abuse ?  AKI (acute kidney injury) ([place]) ?  Symptomatic anemia ?  Rectal bleeding ?  Abnormal finding on imaging ? ?Resolved Problems: ?  * No resolved hospital problems. * ? ?Hospital Course: ?66 year old male with medical history of atrial fibrillation on Coumadin, chronic systolic heart failure, hypertension, hyperlipidemia presented with rectal bleeding.  Gastroenterology was consulted. ? ?Assessment and Plan: ? ?* GIB (gastrointestinal bleeding) ?- Presented with lower GI bleed ?-Still having bloody stools, likely old clotted blood ?-Hemoglobin was 6.0 this morning; 2 units PRBC ordered ?-Gastroenterology was consulted, colonoscopy showed diverticulosis in the entire colon ?-GI feels that patient had bleeding due to diverticulosis. ?-GI will follow-up with patient in clinic in 1 week for repeat CBC ? ?Abnormal finding on imaging ?- CTA abdomen shows perinephric fat stranding around kidneys; could be acute or chronic ?-UA was clear ? ?Symptomatic anemia ?-Hemoglobin is 7.6 this morning ?-S/p 4 units PRBC in the hospital ?-Patient received IV iron in the hospital ?-We will discharge on ferrous gluconate 324 mg p.o. daily ? ?AKI (acute kidney injury) ([place]) ?-Resolved, creatinine has improved to 1.33 ? ?Alcohol abuse ?- No signs and symptoms  of alcohol withdrawal ?-Started on CIWA protocol with Ativan ?-Abdominal ultrasound shows fatty liver ? ?Hyponatremia ?- Resolved ? ?Persistent atrial fibrillation ([place]) ?-Restart taking Coumadin as per GI ? ?Essential hypertension ?-Continue home regimen ? ? ? ? ?  ? ? ?Consultants: Gastroenterology ?Procedures performed:  ?Disposition: Home ?Diet recommendation:  ?Discharge Diet Orders (From admission, onward)  ? ?  Start     Ordered  ? 01/31/22 0000  Diet - low sodium heart healthy       ? 01/31/22 1345  ? ?  ?  ? ?  ? ?Carb modified diet ?DISCHARGE MEDICATION: ?Allergies as of 01/31/2022   ? ?   Reactions  ? Fentanyl Itching  ? ?  ? ?  ?Medication List  ?  ? ?STOP taking these medications   ? ?sulfamethoxazole-trimethoprim 800-160 MG tablet ?Commonly known as: BACTRIM DS ?  ? ?  ? ?TAKE these medications   ? ?ALPRAZolam 0.5 MG tablet ?Commonly known as: Duanne Moron ?Take 0.5 mg by mouth 2 (two) times daily. ?  ?aspirin 81 MG EC tablet ?Take 1 tablet (81 mg total) by mouth daily. ?  ?atorvastatin 20 MG tablet ?Commonly known as: LIPITOR ?Take 20 mg by mouth every morning. ?  ?docusate sodium 100 MG capsule ?Commonly known as: COLACE ?Take 300 mg by mouth daily. ?  ?ferrous gluconate 324 MG tablet ?Commonly known as: FERGON ?Take 1 tablet (324 mg total) by mouth daily with breakfast. ?  ?HYDROcodone-acetaminophen 5-325 MG tablet ?Commonly known as: NORCO/VICODIN ?Take 2 tablets by mouth every 6 (six) hours as needed for moderate pain. ?What changed:  ?when to take this ?Another medication with the same name was removed. Continue  taking this medication, and follow the directions you see here. ?  ?losartan 50 MG tablet ?Commonly known as: COZAAR ?Take 1 tablet by mouth once daily ?What changed: when to take this ?  ?Melatonin 10 MG Tabs ?Take 10 mg by mouth at bedtime. ?  ?metoprolol tartrate 25 MG tablet ?Commonly known as: LOPRESSOR ?Take 1/2 (one-half) tablet by mouth twice daily ?What changed: See the new  instructions. ?  ?multivitamin with minerals Tabs tablet ?Take 1 tablet by mouth every morning. ?  ?naloxone 4 MG/0.1ML Liqd nasal spray kit ?Commonly known as: NARCAN ?Place 1 spray into the nose once as needed (opioid overdose). ?  ?pantoprazole 40 MG tablet ?Commonly known as: PROTONIX ?Take 1 tablet (40 mg total) by mouth daily. ?Start taking on: February 01, 2022 ?  ?potassium chloride 10 MEQ tablet ?Commonly known as: KLOR-CON M ?Take 1 tablet (10 mEq total) by mouth 2 (two) times daily. Please schedule appt for future refills. 1st attempt ?  ?spironolactone 25 MG tablet ?Commonly known as: ALDACTONE ?Take 1 tablet by mouth once daily ?What changed: when to take this ?  ?torsemide 20 MG tablet ?Commonly known as: Demadex ?Take 1 tablet (20 mg total) by mouth daily. ?  ?warfarin 5 MG tablet ?Commonly known as: COUMADIN ?Take as directed. If you are unsure how to take this medication, talk to your nurse or doctor. ?Original instructions: Take 1-1.5 tablets (5-7.5 mg total) by mouth See admin instructions. Takes 5 mg daily except Wednesday. Takes 7.5 mg once daily on Wednesday ?What changed: additional instructions ?  ? ?  ? ? Follow-up Information   ? ? [name], MD Follow up in 1 week(s).   ?Specialty: Family Medicine ?Contact information: ?[address] ?[address] ?[facility] ?[place] [address] ?[phone] ? ? ?  ?  ? ? Lorretta Harp, MD .   ?Specialties: Cardiology, Radiology ?Contact information: ?[address] ?[address] ?[place] [address] ?[phone] ? ? ?  ?  ? ? Mansouraty, Telford Nab., MD Follow up in 1 week(s).   ?Specialties: Gastroenterology, Internal Medicine ?Contact information: ?[address] ?[place] [address] ?[phone] ? ? ?  ?  ? ?  ?  ? ?  ? ?Discharge Exam: ?Filed Weights  ? 01/30/22 0500 01/30/22 1433 01/31/22 0454  ?Weight: 104.8 kg 102.1 kg 101 kg  ? ?General-appears in no acute distress ?Heart-S1-S2, regular, no murmur auscultated ?Lungs-clear to  auscultation bilaterally, no wheezing or crackles auscultated ?Abdomen-soft, nontender, no organomegaly ?Extremities-no edema in the lower extremities ?Neuro-alert, oriented x3, no focal deficit noted ? ?Condition at discharge: good ? ?The results of significant diagnostics from this hospitalization (including imaging, microbiology, ancillary and laboratory) are listed below for reference.  ? ?Imaging Studies: ?US Abdomen Complete ? ?Result Date: 01/29/2022 ?CLINICAL DATA:  Acute kidney injury EXAM: ABDOMEN ULTRASOUND COMPLETE COMPARISON:  CT chest abdomen pelvis 01/03/2019 FINDINGS: Gallbladder: No gallstones or wall thickening visualized. Mild gallbladder sludge. No sonographic Murphy sign noted by sonographer. Common bile duct: Diameter: 7.1 mm Liver: 2.5 cm cyst left lobe liver unchanged from prior CT. Increased echogenicity liver parenchyma diffusely. Portal vein is patent on color Doppler imaging with normal direction of blood flow towards the liver. IVC: No abnormality visualized. Pancreas: Visualized portion unremarkable. Pancreatic duct upper normal 2.6 mm Spleen: Size and appearance within normal limits. Right Kidney: Length: 11.6 cm in length. Negative for hydronephrosis. Echogenic focus in the cortex measuring 1 cm. Probable fat in angiomyolipoma. This is  faintly visualized on the prior CT. Left Kidney: Length: 11.5 cm in length. Echogenicity within normal limits. No mass or hydronephrosis visualized. Abdominal aorta: No aneurysm visualized. Other findings: None. IMPRESSION: Negative for gallstones or biliary dilatation Echogenic liver compatible fatty infiltration. 2.5 cm cyst left lobe liver 1 cm echogenic lesion in the cortex of the right kidney. This may represent a small angiomyolipoma Electronically Signed   By: Franchot Gallo M.D.   On: 01/29/2022 13:05  ? ?CT ANGIO GI BLEED ? ?Result Date: 01/30/2022 ?CLINICAL DATA:  Lower GI bleed EXAM: CTA ABDOMEN AND PELVIS WITHOUT AND WITH CONTRAST TECHNIQUE:  Multidetector CT imaging of the abdomen and pelvis was performed using the standard protocol during bolus administration of intravenous contrast. Multiplanar reconstructed images and MIPs were obtained and reviewe

## 2022-01-31 NOTE — Progress Notes (Addendum)
? ?Gastroenterology Inpatient Follow-up Note  ? ?PATIENT IDENTIFICATION  ?[name] is a 66 y.o. male with a pmh significant for status post MVR, A-fib (status post ablation and clipping), CHF, hypertension, hyperlipidemia, diverticulosis, colon polyps.  Patient presented with dark stools and hematochezia and was found to have acute blood loss anemia. ?Hospital Day: 3 ? ?SUBJECTIVE  ?The patient is evaluated with his wife at bedside today.  Patient is feeling well.  He has not had any bowel movements for over 18 hours.  He is eating well.  His hemogram was 6.7 this morning and he has received a packed unit of blood since.  He is receiving IV iron as well today during my visit.  Denies any abdominal pain or discomfort.  No fevers or chills.  He is hoping to be discharged later today. ? ? ?OBJECTIVE  ?Scheduled Inpatient Medications:  ? ALPRAZolam  0.5 mg Oral BID  ? atorvastatin  20 mg Oral q morning  ? folic acid  1 mg Oral Daily  ? melatonin  10 mg Oral QHS  ? multivitamin with minerals  1 tablet Oral Daily  ? pantoprazole (PROTONIX) IV  40 mg Intravenous Q24H  ? thiamine  100 mg Oral Daily  ? Or  ? thiamine  100 mg Intravenous Daily  ? ?Continuous Inpatient Infusions:  ? ferric gluconate (FERRLECIT) IVPB 250 mg (01/31/22 6720)  ? ?PRN Inpatient Medications: bisacodyl, HYDROcodone-acetaminophen, LORazepam **OR** LORazepam, ondansetron (ZOFRAN) IV ? ? ?Physical Examination  ?Temp:  [97.8 ?F (36.6 ?C)-98.8 ?F (37.1 ?C)] 98.1 ?F (36.7 ?C) (04/01 9470) ?Pulse Rate:  [81-116] 90 (04/01 0934) ?Resp:  [16-27] 18 (04/01 0934) ?BP: (128-165)/(78-102) 151/99 (04/01 0934) ?SpO2:  [92 %-100 %] 99 % (04/01 0934) ?Weight:  [101 kg-102.1 kg] 101 kg (04/01 0454) Temp (24hrs), Avg:98.4 ?F (36.9 ?C), Min:97.8 ?F (36.6 ?C), Max:98.8 ?F (37.1 ?C) ? Weight: 101 kg ?GEN: NAD, appears stated age, doesn't appear chronically ill, wife at bedside ?PSYCH: Cooperative, without pressured speech ?EYE: Conjunctivae pale-pink, sclerae  anicteric ?ENT: MMM ?CV: Nontachycardic ?RESP: No audible wheezing ?GI: NABS, soft, protuberant abdomen, nontender, without rebound or guarding ?MSK/EXT: Trace bilateral lower extremity edema ?SKIN: No jaundice ?NEURO:  Alert & Oriented x 3, no focal deficits ? ? ? ?Review of Data  ? ?Laboratory Studies  ? ?Recent Labs  ?Lab 01/29/22 ?1626 01/30/22 ?0357 01/31/22 ?9628  ?NA  --    < > 136  ?K  --    < > 4.0  ?CL  --    < > 106  ?CO2  --    < > 27  ?BUN  --    < > 26*  ?CREATININE  --    < > 1.33*  ?GLUCOSE  --    < > 121*  ?CALCIUM  --    < > 7.8*  ?MG 2.1  --   --   ? < > = values in this interval not displayed.  ? ?Recent Labs  ?Lab 01/30/22 ?3662  ?AST 13*  ?ALT 14  ?ALKPHOS 32*  ?  ?Recent Labs  ?Lab 01/29/22 ?1008 01/29/22 ?1626 01/31/22 ?9476  ?WBC 6.3  --  7.3  ?HGB 7.7*   < > 6.7*  ?HCT 25.3*   < > 20.9*  ?PLT 286  --  206  ? < > = values in this interval not displayed.  ? ?Recent Labs  ?Lab 01/28/22 ?1359 01/29/22 ?1008 01/30/22 ?0357  ?INR 3.0 2.8* 2.5*  ? ? ? ?Imaging  Studies  ?No new imaging studies to review ? ?GI Procedures and Studies  ?March 31 EGD ?- No gross lesions in esophagus. Non-obstructing Schatzki ring. Z-line regular, 42 cm from the incisors. ?- 3 cm hiatal hernia. Erythematous mucosa in the stomach. Biopsied. ?- No gross lesions in the duodenal bulb, in the first portion of the duodenum and in the second portion of the duodenum. ? ?March 31 colonoscopy ?- Hemorrhoids found on digital rectal exam. ?- The examined portion of the ileum was normal. ?- One 3 mm polyp in the ascending colon, removed with a cold snare. Resected and retrieved. ?- Blood in the recto-sigmoid colon and in the sigmoid colon. ?- Diverticulosis in the entire examined colon. ?- Normal mucosa in the entire examined colon otherwise. ?- Non-bleeding non-thrombosed external and internal hemorrhoids. ?- Etiology of bleeding seems to have been most likely diverticular in origin. ? ? ?ASSESSMENT  ?[name] is a 66 y.o. male  with a pmh significant for status post MVR, A-fib (status post ablation and clipping), CHF, hypertension, hyperlipidemia, diverticulosis, colon polyps.  Patient presented with dark stools and hematochezia and was found to have acute blood loss anemia. ? ?The patient is hemodynamically and clinically stable at this time.  He has received a unit of packed RBCs for his drop in hemogram due to equilibration.  At this point, the patient is doing well.  He is receiving IV iron as well which will help with his stores and build his blood counts back up.  Looks like what we have dealt with is a diverticular hemorrhage and he has stopped.  Hopefully he never has this happen again.  We did discuss that he is at risk of having recurrence in the future.  His pandiverticulosis makes this more difficult.  Repeat bleeding would lead to need for CT angiography or tagged RBC in the future.  We will see what the colon polyp is and likely see him back in 5 years for colonoscopy with his primary gastroenterologist.  The patient can come to the [facility] office on Wednesday or Thursday and have a repeat CBC performed to ensure stability or improvement in his blood counts.  We will set up a follow-up with Dr. Fuller Plan or one of our APP's.  He will need to initiate oral iron upon discharge.  All patient questions were answered to the best of my ability, and the patient agrees to the aforementioned plan of action with follow-up as indicated. ? ? ?PLAN/RECOMMENDATIONS  ?No need for IV PPI so transition to oral PPI once daily ?Complete IV iron infusion today ?Upon discharge start oral iron ferrous gluconate 324 mg daily or ferrous sulfate 325 mg daily ?May restart Coumadin tomorrow ?High-fiber diet should be continued ?Continue Benefiber as you are already doing at home ?May continue stool softeners ?We will send message to our outpatient GI team to have patient come in for CBC by Wednesday or Thursday of this week for follow-up blood count ?We  will work on arranging follow-up in clinic ?We will follow-up pathology ?If it is deemed that the patient is stable for discharge I have no reason that he cannot be from GI standpoint.  If he remains in the hospital then please let us know and we will be happy to see him tomorrow ? ? ?GI will sign off at this time.  Please page/call with questions or concerns. ? ? ?Justice Britain, MD ?[facility] ?[facility] ?Office # [phone] ? ? ? LOS: 1 day  ?  [facility]  01/31/2022, 10:22 AM ? ?

## 2022-02-01 LAB — TYPE AND SCREEN
ABO/RH(D): O POS
Antibody Screen: NEGATIVE
Unit division: 0
Unit division: 0
Unit division: 0
Unit division: 0

## 2022-02-01 LAB — BPAM RBC
Blood Product Expiration Date: 202304152359
Blood Product Expiration Date: 202304242359
Blood Product Expiration Date: 202304242359
Blood Product Expiration Date: 202304262359
ISSUE DATE / TIME: 202303301218
ISSUE DATE / TIME: 202303310741
ISSUE DATE / TIME: 202303311259
ISSUE DATE / TIME: 202304010645
Unit Type and Rh: 5100
Unit Type and Rh: 5100
Unit Type and Rh: 5100
Unit Type and Rh: 5100

## 2022-02-01 NOTE — Anesthesia Postprocedure Evaluation (Signed)
Anesthesia Post Note ? ?Patient: [name] ? ?Procedure(s) Performed: ESOPHAGOGASTRODUODENOSCOPY (EGD) ?COLONOSCOPY ? ?  ? ?Patient location during evaluation: PACU ?Anesthesia Type: MAC ?Level of consciousness: awake and alert ?Pain management: pain level controlled ?Vital Signs Assessment: post-procedure vital signs reviewed and stable ?Respiratory status: spontaneous breathing, nonlabored ventilation and respiratory function stable ?Cardiovascular status: stable and blood pressure returned to baseline ?Postop Assessment: no apparent nausea or vomiting ?Anesthetic complications: no ? ? ?No notable events documented. ? ?Last Vitals:  ?Vitals:  ? 01/31/22 0709 01/31/22 0934  ?BP: (!) 146/91 (!) 151/99  ?Pulse: 81 90  ?Resp: 20 18  ?Temp: 36.9 ?C 36.7 ?C  ?SpO2:  99%  ?  ?Last Pain:  ?Vitals:  ? 01/31/22 0934  ?TempSrc: Oral  ?PainSc:   ? ? ?  ?  ?  ?  ?  ?  ? ?[name] ? ? ? ? ?

## 2022-02-02 ENCOUNTER — Ambulatory Visit (INDEPENDENT_AMBULATORY_CARE_PROVIDER_SITE_OTHER): Payer: Medicare HMO | Admitting: Orthopedic Surgery

## 2022-02-02 ENCOUNTER — Encounter (HOSPITAL_COMMUNITY): Payer: Self-pay | Admitting: Gastroenterology

## 2022-02-02 ENCOUNTER — Encounter: Payer: Self-pay | Admitting: Cardiovascular Disease

## 2022-02-02 DIAGNOSIS — S81802D Unspecified open wound, left lower leg, subsequent encounter: Secondary | ICD-10-CM

## 2022-02-03 ENCOUNTER — Telehealth: Payer: Self-pay

## 2022-02-03 ENCOUNTER — Encounter: Payer: Self-pay | Admitting: Orthopedic Surgery

## 2022-02-03 NOTE — Progress Notes (Signed)
? ?Office Visit Note ?  ?Patient: [name]           ?Date of Birth: [birth year]           ?MRN: [id number] ?Visit Date: 02/02/2022 ?             ?Requested by: [name], MD ?[address] ?[address] ?[place],  [place] [address] ?PCP: [name], MD ? ?Chief Complaint  ?Patient presents with  ? Left Leg - Routine Post Op  ?  01/02/22 left leg debridement and kerecis graft  ? ? ? ? ?HPI: ?Patient is a 66 year old gentleman who presents in follow-up status post skin graft chronic ulcer left leg.  Patient was recently admitted with a GI bleed he states he received 5 units of packed red blood cells.  The source of the bleed was not identified. ? ?Assessment & Plan: ?Visit Diagnoses:  ?1. Leg wound, left, subsequent encounter   ? ? ?Plan: We will apply a Dynaflex 3 layer compression wrap follow-up in 1 week.  Patient is showing slow healing. ? ?Follow-Up Instructions: Return in about 1 week (around 02/09/2022).  ? ?Ortho Exam ? ?Patient is alert, oriented, no adenopathy, well-dressed, normal affect, normal respiratory effort. ?Examination there is healthy granulation tissue over the wound bed.  There is superficial epithelialization around the edges.  The widest part of the wound is 17 cm in width the longest length is 17 cm and the area of healthy tissue proximally is 5 cm. ? ?Imaging: ?No results found. ? ? ?Labs: ?Lab Results  ?Component Value Date  ? HGBA1C 5.4 01/29/2022  ? HGBA1C 5.2 03/16/2019  ? REPTSTATUS 01/07/2022 FINAL 01/02/2022  ? REPTSTATUS 01/08/2022 FINAL 01/02/2022  ? GRAMSTAIN  01/02/2022  ?  FEW WBC PRESENT,BOTH PMN AND MONONUCLEAR ?RARE GRAM POSITIVE COCCI ?RARE GRAM NEGATIVE RODS ?Performed at [facility], [place] [address], [place], [place] [address] ?  ? GRAMSTAIN  01/02/2022  ?  FEW WBC PRESENT,BOTH PMN AND MONONUCLEAR ?NO ORGANISMS SEEN ?  ? CULT  01/02/2022  ?  FEW STREPTOCOCCUS GROUP C ?Beta hemolytic streptococci are predictably susceptible to penicillin and other beta  lactams. Susceptibility testing not routinely performed. ?ABUNDANT KLEBSIELLA OXYTOCA ?ABUNDANT STENOTROPHOMONAS MALTOPHILIA ?  ? CULT  01/02/2022  ?  NO ANAEROBES ISOLATED ?Performed at [facility], [place] [address], [place], [place] [address] ?  ? LABORGA KLEBSIELLA OXYTOCA 01/02/2022  ? [place] 01/02/2022  ? ? ? ?Lab Results  ?Component Value Date  ? ALBUMIN 2.5 (L) 01/30/2022  ? ALBUMIN 3.1 (L) 01/29/2022  ? ALBUMIN 4.1 09/22/2021  ? ? ?Lab Results  ?Component Value Date  ? MG 2.1 01/29/2022  ? MG 2.1 06/19/2019  ? MG 2.2 03/25/2019  ? ?No results found for: VD25OH ? ?No results found for: PREALBUMIN ? ?  Latest Ref Rng & Units 01/31/2022  ? 11:41 AM 01/31/2022  ?  3:38 AM 01/30/2022  ?  6:26 PM  ?CBC EXTENDED  ?WBC 4.0 - 10.5 K/uL  7.3     ?RBC 4.22 - 5.81 MIL/uL  2.46     ?Hemoglobin 13.0 - 17.0 g/dL 7.6   6.7   7.4    ?HCT 39.0 - 52.0 % 23.4   20.9   23.4    ?Platelets 150 - 400 K/uL  206     ? ? ? ?There is no height or weight on file to calculate BMI. ? ?Orders:  ?No orders of the defined  types were placed in this encounter. ? ?No orders of the defined types were placed in this encounter. ? ? ? Procedures: ?No procedures performed ? ?Clinical Data: ?No additional findings. ? ?ROS: ? ?All other systems negative, except as noted in the HPI. ?Review of Systems ? ?Objective: ?Vital Signs: There were no vitals taken for this visit. ? ?Specialty Comments:  ?No specialty comments available. ? ?PMFS History: ?Patient Active Problem List  ? Diagnosis Date Noted  ? Rectal bleeding 01/30/2022  ? Abnormal finding on imaging 01/30/2022  ? GIB (gastrointestinal bleeding) 01/29/2022  ? Hyponatremia 01/29/2022  ? Hypocalcemia 01/29/2022  ? Alcohol abuse 01/29/2022  ? AKI (acute kidney injury) ([place]) 01/29/2022  ? Symptomatic anemia 01/29/2022  ? Venous stasis dermatitis of both lower extremities   ? Bleeding on Coumadin   ? Leg wound, left, subsequent encounter 12/30/2021  ? Compartment syndrome  of left lower extremity ([place]) 09/22/2021  ? Status post surgery 09/22/2021  ? Compartment syndrome of left upper extremity ([place]) 09/22/2021  ? Hyperlipidemia 12/13/2020  ? Atrial fibrillation ([place]) 06/10/2020  ? Long term (current) use of anticoagulants 06/10/2020  ? Secondary hypercoagulable state ([place]) 04/24/2020  ? AV junctional bradycardia 03/22/2019  ? S/P minimally invasive mitral valve repair  03/21/2019  ? S/P Maze operation for atrial fibrillation 03/21/2019  ? Dilated aortic root ([place]) 01/04/2019  ? Dilated cardiomyopathy ([place]) 01/04/2019  ? Atrial flutter with rapid ventricular response ([place])   ? Mitral valve prolapse   ? Hypertensive urgency 12/30/2018  ? Acute on chronic systolic (congestive) heart failure ([place]) 12/30/2018  ? Acute systolic heart failure ([place]) 12/30/2018  ? Chronic systolic (congestive) heart failure (HCC)   ? Persistent atrial fibrillation ([place]) 12/02/2018  ? Right hamstring muscle strain 07/20/2018  ? Essential hypertension 02/07/2016  ? Severe mitral regurgitation   ? Annual physical exam 11/16/2014  ? Arthritis of left knee 10/23/2014  ? Status post total left knee replacement 10/23/2014  ? Arthritis of knee, right 01/19/2014  ? Status post total knee replacement 01/19/2014  ? Benign paroxysmal positional vertigo 10/30/2013  ? Testicular hypofunction 03/24/2012  ? ?Past Medical History:  ?Diagnosis Date  ? Acute on chronic systolic (congestive) heart failure ([place]) 12/30/2018  ? Allergy   ? Arthritis   ? Atrial flutter with rapid ventricular response ([employer])   ? Chronic systolic (congestive) heart failure (HCC)   ? COVID 10/2021  ? mild  ? Dilated aortic root ([place]) 01/04/2019  ? Dilated cardiomyopathy ([place]) 01/04/2019  ? GERD (gastroesophageal reflux disease)   ? history of  ? Heart murmur   ? History of colon polyps   ? Hypertension   ? Insomnia   ? Mitral valve prolapse   ? Mitral valve regurgitation   ? Persistent atrial fibrillation ([place]) 12/02/2018  ? S/P Maze operation for atrial  fibrillation 03/21/2019  ? Complete bilateral atrial lesion set using cryothermy and bipolar radiofrequency ablation with clipping of LA appendage via right mini thoracotomy approach  ? S/P minimally invasive mitral valve repair 03/21/2019  ? Complex valvuloplasty including triangular resection of posterior leaflet, artificial Gore-tex neochord placement x4 and 36 mm Sorin Memo 4D ring annuloplasty via right mini thoracotomy approach  ? Seizures ([place])   ? had one approx. 30 yrs ago,has not had any since  ? Severe mitral regurgitation   ?  ?Family History  ?Problem Relation Age of Onset  ? Hypertension Father   ? Colon cancer Father   ?     dx in  his late 70's  ? Diabetes Mother   ? Stomach cancer Neg Hx   ? Esophageal cancer Neg Hx   ? Pancreatic cancer Neg Hx   ? Rectal cancer Neg Hx   ?  ?Past Surgical History:  ?Procedure Laterality Date  ? ANKLE FUSION  left  ? Dec. 2012  ? APPLICATION OF WOUND VAC Left 09/22/2021  ? Procedure: APPLICATION OF WOUND VAC;  Surgeon: Mcarthur Rossetti, MD;  Location: [facility];  Service: Orthopedics;  Laterality: Left;  ? APPLICATION OF WOUND VAC Left 12/30/2021  ? Procedure: APPLICATION OF WOUND VAC;  Surgeon: Mcarthur Rossetti, MD;  Location: [facility];  Service: Orthopedics;  Laterality: Left;  ? ARTHROSCOPY KNEE W/ DRILLING  bilateral  ? 2012  ? BIOPSY  01/30/2022  ? Procedure: BIOPSY;  Surgeon: Irving Copas., MD;  Location: [facility];  Service: Gastroenterology;;  ? CARDIAC VALVE REPLACEMENT    ? CARDIOVERSION N/A 01/03/2019  ? Procedure: CARDIOVERSION;  Surgeon: Sueanne Margarita, MD;  Location: [facility];  Service: Cardiovascular;  Laterality: N/A;  ? CARDIOVERSION N/A 06/23/2019  ? Procedure: CARDIOVERSION;  Surgeon: Fay Records, MD;  Location: [facility];  Service: Cardiovascular;  Laterality: N/A;  ? CLIPPING OF ATRIAL APPENDAGE  03/21/2019  ? Procedure: Clipping Of Atrial Appendage using 75m Atricure Pro2 Clip;  Surgeon: ORexene Alberts MD;  [facility];  Service: Open [facility] Surgery;;  ? COLONOSCOPY    ? x2  ? COLONOSCOPY N/A 01/30/2022  ? Procedure: COLONOSCOPY;  Surgeon: MRush LandmarkGTelford Nab, MD;  [facility];  Service: GGevena [facility]re

## 2022-02-03 NOTE — Telephone Encounter (Signed)
-----   Message from Marlon Pel, RN sent at 02/03/2022  4:52 PM EDT ----- ?Regarding: FW: Follow-up ? ?----- Message ----- ?From: Mansouraty, Telford Nab., MD ?Sent: 01/31/2022  10:31 AM EDT ?To: Marlon Pel, RN, Ladene Artist, MD ?Subject: Follow-up                                     ? ?Sheri, ?This patient will need a CBC order in the system for follow-up by Wednesday or Thursday of this week.  Please put it under Dr. Lynne Leader name.  Also set up a follow-up in clinic with Dr. Fuller Plan or one of the APP's in 6 to 10 weeks. ? ?MS, this patient presented with what appears to be a diverticular hemorrhage.  He also had a polyp that was removed.  He is usually been on a 5-year plan due to his history and family history.  I will update you with the pathology. ? ?Thanks. ?GM ? ? ?

## 2022-02-04 ENCOUNTER — Other Ambulatory Visit: Payer: Self-pay | Admitting: Gastroenterology

## 2022-02-04 ENCOUNTER — Other Ambulatory Visit: Payer: Self-pay | Admitting: Family

## 2022-02-04 ENCOUNTER — Other Ambulatory Visit: Payer: Self-pay

## 2022-02-04 ENCOUNTER — Encounter: Payer: Self-pay | Admitting: Gastroenterology

## 2022-02-04 ENCOUNTER — Other Ambulatory Visit (INDEPENDENT_AMBULATORY_CARE_PROVIDER_SITE_OTHER): Payer: Medicare HMO

## 2022-02-04 ENCOUNTER — Other Ambulatory Visit: Payer: Self-pay | Admitting: Cardiovascular Disease

## 2022-02-04 DIAGNOSIS — Z8719 Personal history of other diseases of the digestive system: Secondary | ICD-10-CM

## 2022-02-04 LAB — CBC
HCT: 25.8 % — ABNORMAL LOW (ref 39.0–52.0)
Hemoglobin: 8.4 g/dL — ABNORMAL LOW (ref 13.0–17.0)
MCHC: 32.7 g/dL (ref 30.0–36.0)
MCV: 85.6 fl (ref 78.0–100.0)
Platelets: 270 10*3/uL (ref 150.0–400.0)
RBC: 3.02 Mil/uL — ABNORMAL LOW (ref 4.22–5.81)
RDW: 19.7 % — ABNORMAL HIGH (ref 11.5–15.5)
WBC: 8 10*3/uL (ref 4.0–10.5)

## 2022-02-04 MED ORDER — HYDROCODONE-ACETAMINOPHEN 5-325 MG PO TABS: 2.0000 | ORAL_TABLET | Freq: Four times a day (QID) | ORAL | 0 refills | Status: DC | PRN

## 2022-02-04 NOTE — Telephone Encounter (Signed)
Spoke with patient regarding lab work & follow up appointment. Labs have already been collected today, and follow up scheduled with Dr. Fuller Plan for 03/18/22 at 10:30 am.  ?

## 2022-02-05 ENCOUNTER — Ambulatory Visit (INDEPENDENT_AMBULATORY_CARE_PROVIDER_SITE_OTHER): Payer: Medicare HMO | Admitting: Orthopedic Surgery

## 2022-02-05 ENCOUNTER — Encounter: Payer: Self-pay | Admitting: Orthopedic Surgery

## 2022-02-05 DIAGNOSIS — S81802D Unspecified open wound, left lower leg, subsequent encounter: Secondary | ICD-10-CM

## 2022-02-06 ENCOUNTER — Encounter: Payer: Self-pay | Admitting: Orthopedic Surgery

## 2022-02-06 LAB — SURGICAL PATHOLOGY

## 2022-02-06 NOTE — Progress Notes (Signed)
? ?Office Visit Note ?  ?Patient: [name]           ?Date of Birth: 1956/05/13           ?MRN: [id number] ?Visit Date: 02/05/2022 ?             ?Requested by: [name], MD ?[address] ?[address] ?[place],  [place] [address] ?PCP: [name], MD ? ?Chief Complaint  ?Patient presents with  ? Left Leg - Routine Post Op  ?  01/02/22 LLE debridement and Kerecis graft   ? ? ? ? ?HPI: ?Patient is a 66 year old gentleman who presents status post application of Kerecis graft left leg wound. ? ?Assessment & Plan: ?Visit Diagnoses:  ?1. Leg wound, left, subsequent encounter   ? ? ?Plan: We will start daily dressing changes with 4 x 4's ABD Curlex and an Ace wrap. ? ?Follow-Up Instructions: Return in about 1 week (around 02/12/2022).  ? ?Ortho Exam ? ?Patient is alert, oriented, no adenopathy, well-dressed, normal affect, normal respiratory effort. ?Examination the drainage has drained through the compression wrap.  There is fibrinous tissue over the wound this was debrided there is a good healthy granulating wound base.  Patient is heading out of the town this weekend and will start daily dressing changes. ? ?Imaging: ?No results found. ?No images are attached to the encounter. ? ?Labs: ?Lab Results  ?Component Value Date  ? HGBA1C 5.4 01/29/2022  ? HGBA1C 5.2 03/16/2019  ? REPTSTATUS 01/07/2022 FINAL 01/02/2022  ? REPTSTATUS 01/08/2022 FINAL 01/02/2022  ? GRAMSTAIN  01/02/2022  ?  FEW WBC PRESENT,BOTH PMN AND MONONUCLEAR ?RARE GRAM POSITIVE COCCI ?RARE GRAM NEGATIVE RODS ?Performed at [facility], [place] [address], [place], [place] [address] ?  ? GRAMSTAIN  01/02/2022  ?  FEW WBC PRESENT,BOTH PMN AND MONONUCLEAR ?NO ORGANISMS SEEN ?  ? CULT  01/02/2022  ?  FEW STREPTOCOCCUS GROUP C ?Beta hemolytic streptococci are predictably susceptible to penicillin and other beta lactams. Susceptibility testing not routinely performed. ?ABUNDANT KLEBSIELLA OXYTOCA ?ABUNDANT STENOTROPHOMONAS MALTOPHILIA ?  ? CULT   01/02/2022  ?  NO ANAEROBES ISOLATED ?Performed at [facility], [place] [address] [place], [place] [address] ?  ? LABORGA KLEBSIELLA OXYTOCA 01/02/2022  ? [place] 01/02/2022  ? ? ? ?Lab Results  ?Component Value Date  ? ALBUMIN 2.5 (L) 01/30/2022  ? ALBUMIN 3.1 (L) 01/29/2022  ? ALBUMIN 4.1 09/22/2021  ? ? ?Lab Results  ?Component Value Date  ? MG 2.1 01/29/2022  ? MG 2.1 06/19/2019  ? MG 2.2 03/25/2019  ? ?No results found for: VD25OH ? ?No results found for: PREALBUMIN ? ?  Latest Ref Rng & Units 02/04/2022  ?  8:39 AM 01/31/2022  ? 11:41 AM 01/31/2022  ?  3:38 AM  ?CBC EXTENDED  ?WBC 4.0 - 10.5 K/uL 8.0    7.3    ?RBC 4.22 - 5.81 Mil/uL 3.02    2.46    ?Hemoglobin 13.0 - 17.0 g/dL 8.4 Repeated and verified X2.   7.6   6.7    ?HCT 39.0 - 52.0 % 25.8 Repeated and verified X2.   23.4   20.9    ?Platelets 150.0 - 400.0 K/uL 270.0    206    ? ? ? ?There is no height or weight on file to calculate BMI. ? ?Orders:  ?No orders of the defined types were placed in this encounter. ? ?No orders of the defined types were placed  in this encounter. ? ? ? Procedures: ?No procedures performed ? ?Clinical Data: ?No additional findings. ? ?ROS: ? ?All other systems negative, except as noted in the HPI. ?Review of Systems ? ?Objective: ?Vital Signs: There were no vitals taken for this visit. ? ?Specialty Comments:  ?No specialty comments available. ? ?PMFS History: ?Patient Active Problem List  ? Diagnosis Date Noted  ? Rectal bleeding 01/30/2022  ? Abnormal finding on imaging 01/30/2022  ? GIB (gastrointestinal bleeding) 01/29/2022  ? Hyponatremia 01/29/2022  ? Hypocalcemia 01/29/2022  ? Alcohol abuse 01/29/2022  ? AKI (acute kidney injury) ([place]) 01/29/2022  ? Symptomatic anemia 01/29/2022  ? Venous stasis dermatitis of both lower extremities   ? Bleeding on Coumadin   ? Leg wound, left, subsequent encounter 12/30/2021  ? Compartment syndrome of left lower extremity ([place]) 09/22/2021  ? Status post  surgery 09/22/2021  ? Compartment syndrome of left upper extremity ([place]) 09/22/2021  ? Hyperlipidemia 12/13/2020  ? Atrial fibrillation ([place]) 06/10/2020  ? Long term (current) use of anticoagulants 06/10/2020  ? Secondary hypercoagulable state ([place]) 04/24/2020  ? AV junctional bradycardia 03/22/2019  ? S/P minimally invasive mitral valve repair  03/21/2019  ? S/P Maze operation for atrial fibrillation 03/21/2019  ? Dilated aortic root ([place]) 01/04/2019  ? Dilated cardiomyopathy ([place]) 01/04/2019  ? Atrial flutter with rapid ventricular response ([place])   ? Mitral valve prolapse   ? Hypertensive urgency 12/30/2018  ? Acute on chronic systolic (congestive) heart failure ([place]) 12/30/2018  ? Acute systolic heart failure ([place]) 12/30/2018  ? Chronic systolic (congestive) heart failure (HCC)   ? Persistent atrial fibrillation ([place]) 12/02/2018  ? Right hamstring muscle strain 07/20/2018  ? Essential hypertension 02/07/2016  ? Severe mitral regurgitation   ? Annual physical exam 11/16/2014  ? Arthritis of left knee 10/23/2014  ? Status post total left knee replacement 10/23/2014  ? Arthritis of knee, right 01/19/2014  ? Status post total knee replacement 01/19/2014  ? Benign paroxysmal positional vertigo 10/30/2013  ? Testicular hypofunction 03/24/2012  ? ?Past Medical History:  ?Diagnosis Date  ? Acute on chronic systolic (congestive) heart failure ([place]) 12/30/2018  ? Allergy   ? Arthritis   ? Atrial flutter with rapid ventricular response ([place])   ? Chronic systolic (congestive) heart failure (HCC)   ? COVID 10/2021  ? mild  ? Dilated aortic root ([place]) 01/04/2019  ? Dilated cardiomyopathy ([place]) 01/04/2019  ? GERD (gastroesophageal reflux disease)   ? history of  ? Heart murmur   ? History of colon polyps   ? Hypertension   ? Insomnia   ? Mitral valve prolapse   ? Mitral valve regurgitation   ? Persistent atrial fibrillation ([place]) 12/02/2018  ? S/P Maze operation for atrial fibrillation 03/21/2019  ? Complete bilateral atrial  lesion set using cryothermy and bipolar radiofrequency ablation with clipping of LA appendage via right mini thoracotomy approach  ? S/P minimally invasive mitral valve repair 03/21/2019  ? Complex valvuloplasty including triangular resection of posterior leaflet, artificial Gore-tex neochord placement x4 and 36 mm Sorin Memo 4D ring annuloplasty via right mini thoracotomy approach  ? Seizures ([place])   ? had one approx. 30 yrs ago,has not had any since  ? Severe mitral regurgitation   ?  ?Family History  ?Problem Relation Age of Onset  ? Hypertension Father   ? Colon cancer Father   ?     dx in his late 46's  ? Diabetes Mother   ? Stomach cancer Neg Hx   ?  Esophageal cancer Neg Hx   ? Pancreatic cancer Neg Hx   ? Rectal cancer Neg Hx   ?  ?Past Surgical History:  ?Procedure Laterality Date  ? ANKLE FUSION  left  ? Dec. 2012  ? APPLICATION OF WOUND VAC Left 09/22/2021  ? Procedure: APPLICATION OF WOUND VAC;  Surgeon: Mcarthur Rossetti, MD;  Location: [facility];  Service: Orthopedics;  Laterality: Left;  ? APPLICATION OF WOUND VAC Left 12/30/2021  ? Procedure: APPLICATION OF WOUND VAC;  Surgeon: Mcarthur Rossetti, MD;  Location: [facility];  Service: Orthopedics;  Laterality: Left;  ? ARTHROSCOPY KNEE W/ DRILLING  bilateral  ? 2012  ? BIOPSY  01/30/2022  ? Procedure: BIOPSY;  Surgeon: Irving Copas., MD;  Location: [facility];  Service: Gastroenterology;;  ? CARDIAC VALVE REPLACEMENT    ? CARDIOVERSION N/A 01/03/2019  ? Procedure: CARDIOVERSION;  Surgeon: Sueanne Margarita, MD;  Location: [facility];  Service: Cardiovascular;  Laterality: N/A;  ? CARDIOVERSION N/A 06/23/2019  ? Procedure: CARDIOVERSION;  Surgeon: Fay Records, MD;  Location: [facility];  Service: Cardiovascular;  Laterality: N/A;  ? CLIPPING OF ATRIAL APPENDAGE  03/21/2019  ? Procedure: Clipping Of Atrial Appendage using 67m Atricure Pro2 Clip;  Surgeon: ORexene Alberts MD;  [facility];  Service: Open Heart Surgery;;  ? COLONOSCOPY     ? x2  ? COLONOSCOPY N/A 01/30/2022  ? Procedure: COLONOSCOPY;  Surgeon: MRush LandmarkGTelford Nab, MD;  [facility];  Service: Gastroenterology;  Laterality: N/A;  ? ESOPHAGOGASTRODUODENOSCOPY N/A 3/31

## 2022-02-10 ENCOUNTER — Ambulatory Visit (INDEPENDENT_AMBULATORY_CARE_PROVIDER_SITE_OTHER): Payer: Medicare HMO | Admitting: Orthopedic Surgery

## 2022-02-10 DIAGNOSIS — S81802D Unspecified open wound, left lower leg, subsequent encounter: Secondary | ICD-10-CM

## 2022-02-17 ENCOUNTER — Other Ambulatory Visit: Payer: Self-pay | Admitting: Cardiovascular Disease

## 2022-02-17 ENCOUNTER — Other Ambulatory Visit: Payer: Self-pay | Admitting: Family

## 2022-02-17 MED ORDER — HYDROCODONE-ACETAMINOPHEN 5-325 MG PO TABS: 2.0000 | ORAL_TABLET | Freq: Four times a day (QID) | ORAL | 0 refills | Status: DC | PRN

## 2022-02-18 ENCOUNTER — Other Ambulatory Visit: Payer: Self-pay | Admitting: Cardiovascular Disease

## 2022-02-19 ENCOUNTER — Encounter: Payer: Medicare HMO | Admitting: Orthopedic Surgery

## 2022-02-25 ENCOUNTER — Encounter: Payer: Self-pay | Admitting: Orthopedic Surgery

## 2022-02-25 NOTE — Progress Notes (Signed)
? ?Office Visit Note ?  ?Patient: [name]           ?Date of Birth: [birth year]           ?MRN: [id number] ?Visit Date: 02/10/2022 ?             ?Requested by: [name], MD ?[address] ?[address] ?[place],  [place] [address] ?PCP: [name], MD ? ?Chief Complaint  ?Patient presents with  ? Left Leg - Routine Post Op  ?  01/02/22 LLE deb w/ kerecis graft  ? ? ? ? ?HPI: ?Patient is a 66 year old gentleman who presents in follow-up status post Kerecis tissue graft to the left lower extremity wound. ? ?Assessment & Plan: ?Visit Diagnoses:  ?1. Leg wound, left, subsequent encounter   ? ? ?Plan: Continue with current dressing with 4 x 4's ABD Ace wrap and a compression sock.  We may need to consider adding Kerecis micro powder in the office. ? ?Follow-Up Instructions: Return in about 1 week (around 02/17/2022).  ? ?Ortho Exam ? ?Patient is alert, oriented, no adenopathy, well-dressed, normal affect, normal respiratory effort. ?Examination there is decreased swelling there is improved granulation tissue there is new epithelialization around the wound edges. ? ?Imaging: ?No results found. ?No images are attached to the encounter. ? ?Labs: ?Lab Results  ?Component Value Date  ? HGBA1C 5.4 01/29/2022  ? HGBA1C 5.2 03/16/2019  ? REPTSTATUS 01/07/2022 FINAL 01/02/2022  ? REPTSTATUS 01/08/2022 FINAL 01/02/2022  ? GRAMSTAIN  01/02/2022  ?  FEW WBC PRESENT,BOTH PMN AND MONONUCLEAR ?RARE GRAM POSITIVE COCCI ?RARE GRAM NEGATIVE RODS ?Performed at [facility], [place] [address], [place], [place] [address] ?  ? GRAMSTAIN  01/02/2022  ?  FEW WBC PRESENT,BOTH PMN AND MONONUCLEAR ?NO ORGANISMS SEEN ?  ? CULT  01/02/2022  ?  FEW STREPTOCOCCUS GROUP C ?Beta hemolytic streptococci are predictably susceptible to penicillin and other beta lactams. Susceptibility testing not routinely performed. ?ABUNDANT KLEBSIELLA OXYTOCA ?ABUNDANT STENOTROPHOMONAS MALTOPHILIA ?  ? CULT  01/02/2022  ?  NO ANAEROBES ISOLATED ?Performed  at [facility], [place] [address], [place], [place] [address] ?  ? LABORGA KLEBSIELLA OXYTOCA 01/02/2022  ? [place] 01/02/2022  ? ? ? ?Lab Results  ?Component Value Date  ? ALBUMIN 2.5 (L) 01/30/2022  ? ALBUMIN 3.1 (L) 01/29/2022  ? ALBUMIN 4.1 09/22/2021  ? ? ?Lab Results  ?Component Value Date  ? MG 2.1 01/29/2022  ? MG 2.1 06/19/2019  ? MG 2.2 03/25/2019  ? ?No results found for: VD25OH ? ?No results found for: PREALBUMIN ? ?  Latest Ref Rng & Units 02/04/2022  ?  8:39 AM 01/31/2022  ? 11:41 AM 01/31/2022  ?  3:38 AM  ?CBC EXTENDED  ?WBC 4.0 - 10.5 K/uL 8.0    7.3    ?RBC 4.22 - 5.81 Mil/uL 3.02    2.46    ?Hemoglobin 13.0 - 17.0 g/dL 8.4 Repeated and verified X2.   7.6   6.7    ?HCT 39.0 - 52.0 % 25.8 Repeated and verified X2.   23.4   20.9    ?Platelets 150.0 - 400.0 K/uL 270.0    206    ? ? ? ?There is no height or weight on file to calculate BMI. ? ?Orders:  ?No orders of the defined types were placed in this encounter. ? ?No orders of the defined types were placed in this encounter. ? ? ? Procedures: ?No procedures performed ? ?  Clinical Data: ?No additional findings. ? ?ROS: ? ?All other systems negative, except as noted in the HPI. ?Review of Systems ? ?Objective: ?Vital Signs: There were no vitals taken for this visit. ? ?Specialty Comments:  ?No specialty comments available. ? ?PMFS History: ?Patient Active Problem List  ? Diagnosis Date Noted  ? Rectal bleeding 01/30/2022  ? Abnormal finding on imaging 01/30/2022  ? GIB (gastrointestinal bleeding) 01/29/2022  ? Hyponatremia 01/29/2022  ? Hypocalcemia 01/29/2022  ? Alcohol abuse 01/29/2022  ? AKI (acute kidney injury) ([place]) 01/29/2022  ? Symptomatic anemia 01/29/2022  ? Venous stasis dermatitis of both lower extremities   ? Bleeding on Coumadin   ? Leg wound, left, subsequent encounter 12/30/2021  ? Compartment syndrome of left lower extremity ([place]) 09/22/2021  ? Status post surgery 09/22/2021  ? Compartment syndrome of left  upper extremity ([place]) 09/22/2021  ? Hyperlipidemia 12/13/2020  ? Atrial fibrillation ([place]) 06/10/2020  ? Long term (current) use of anticoagulants 06/10/2020  ? Secondary hypercoagulable state ([place]) 04/24/2020  ? AV junctional bradycardia 03/22/2019  ? S/P minimally invasive mitral valve repair  03/21/2019  ? S/P Maze operation for atrial fibrillation 03/21/2019  ? Dilated aortic root ([place]) 01/04/2019  ? Dilated cardiomyopathy ([place]) 01/04/2019  ? Atrial flutter with rapid ventricular response ([place])   ? Mitral valve prolapse   ? Hypertensive urgency 12/30/2018  ? Acute on chronic systolic (congestive) heart failure ([place]) 12/30/2018  ? Acute systolic heart failure ([place]) 12/30/2018  ? Chronic systolic (congestive) heart failure (HCC)   ? Persistent atrial fibrillation ([place]) 12/02/2018  ? Right hamstring muscle strain 07/20/2018  ? Essential hypertension 02/07/2016  ? Severe mitral regurgitation   ? Annual physical exam 11/16/2014  ? Arthritis of left knee 10/23/2014  ? Status post total left knee replacement 10/23/2014  ? Arthritis of knee, right 01/19/2014  ? Status post total knee replacement 01/19/2014  ? Benign paroxysmal positional vertigo 10/30/2013  ? Testicular hypofunction 03/24/2012  ? ?Past Medical History:  ?Diagnosis Date  ? Acute on chronic systolic (congestive) heart failure ([place]) 12/30/2018  ? Allergy   ? Arthritis   ? Atrial flutter with rapid ventricular response ([place])   ? Chronic systolic (congestive) heart failure (HCC)   ? COVID 10/2021  ? mild  ? Dilated aortic root ([place]) 01/04/2019  ? Dilated cardiomyopathy ([place]) 01/04/2019  ? GERD (gastroesophageal reflux disease)   ? history of  ? Heart murmur   ? History of colon polyps   ? Hypertension   ? Insomnia   ? Mitral valve prolapse   ? Mitral valve regurgitation   ? Persistent atrial fibrillation ([place]) 12/02/2018  ? S/P Maze operation for atrial fibrillation 03/21/2019  ? Complete bilateral atrial lesion set using cryothermy and bipolar  radiofrequency ablation with clipping of LA appendage via right mini thoracotomy approach  ? S/P minimally invasive mitral valve repair 03/21/2019  ? Complex valvuloplasty including triangular resection of posterior leaflet, artificial Gore-tex neochord placement x4 and 36 mm Sorin Memo 4D ring annuloplasty via right mini thoracotomy approach  ? Seizures ([place])   ? had one approx. 30 yrs ago,has not had any since  ? Severe mitral regurgitation   ?  ?Family History  ?Problem Relation Age of Onset  ? Hypertension Father   ? Colon cancer Father   ?     dx in his late 71's  ? Diabetes Mother   ? Stomach cancer Neg Hx   ? Esophageal cancer Neg Hx   ? Pancreatic cancer  Neg Hx   ? Rectal cancer Neg Hx   ?  ?Past Surgical History:  ?Procedure Laterality Date  ? ANKLE FUSION  left  ? Dec. 2012  ? APPLICATION OF WOUND VAC Left 09/22/2021  ? Procedure: APPLICATION OF WOUND VAC;  Surgeon: Mcarthur Rossetti, MD;  Location: [facility];  Service: Orthopedics;  Laterality: Left;  ? APPLICATION OF WOUND VAC Left 12/30/2021  ? Procedure: APPLICATION OF WOUND VAC;  Surgeon: Mcarthur Rossetti, MD;  Location: [facility];  Service: Orthopedics;  Laterality: Left;  ? ARTHROSCOPY KNEE W/ DRILLING  bilateral  ? 2012  ? BIOPSY  01/30/2022  ? Procedure: BIOPSY;  Surgeon: Irving Copas., MD;  Location: [facility];  Service: Gastroenterology;;  ? CARDIAC VALVE REPLACEMENT    ? CARDIOVERSION N/A 01/03/2019  ? Procedure: CARDIOVERSION;  Surgeon: Sueanne Margarita, MD;  Location: [facility];  Service: Cardiovascular;  Laterality: N/A;  ? CARDIOVERSION N/A 06/23/2019  ? Procedure: CARDIOVERSION;  Surgeon: Fay Records, MD;  Location: [facility];  Service: Cardiovascular;  Laterality: N/A;  ? CLIPPING OF ATRIAL APPENDAGE  03/21/2019  ? Procedure: Clipping Of Atrial Appendage using 70m Atricure Pro2 Clip;  Surgeon: ORexene Alberts MD;  [facility];  Service: Open Heart Surgery;;  ? COLONOSCOPY    ? x2  ? COLONOSCOPY N/A 01/30/2022  ?  Procedure: COLONOSCOPY;  Surgeon: MRush LandmarkGTelford Nab, MD;  [facility];  Service: Gastroenterology;  Laterality: N/A;  ? ESOPHAGOGASTRODUODENOSCOPY N/A 01/30/2022  ? Procedure: ESOPHAGOGASTRODUODENOSCOPY

## 2022-02-27 ENCOUNTER — Telehealth: Payer: Self-pay | Admitting: Cardiovascular Disease

## 2022-02-27 ENCOUNTER — Other Ambulatory Visit: Payer: Self-pay | Admitting: Surgical

## 2022-02-27 ENCOUNTER — Telehealth: Payer: Self-pay | Admitting: Family

## 2022-02-27 ENCOUNTER — Other Ambulatory Visit: Payer: Self-pay | Admitting: Cardiovascular Disease

## 2022-02-27 MED ORDER — HYDROCODONE-ACETAMINOPHEN 5-325 MG PO TABS: 1.0000 | ORAL_TABLET | Freq: Three times a day (TID) | ORAL | 0 refills | Status: DC | PRN

## 2022-02-27 NOTE — Telephone Encounter (Signed)
This is a D.r Sharol Given pt that is s/p skin graft and has an appt for follow up on 03/03/2022. Would you please refill the pt's Hydrocodone?  ?

## 2022-02-27 NOTE — Telephone Encounter (Signed)
Called and lm on vm to advise pt that rx has been faxed in.  ?

## 2022-02-27 NOTE — Telephone Encounter (Signed)
?*  STAT* If patient is at the pharmacy, call can be transferred to refill team. ? ? ?1. Which medications need to be refilled? (please list name of each medication and dose if known) potassium chloride (KLOR-CON M) 10 MEQ tablet ? ?2. Which pharmacy/location (including street and city if local pharmacy) is medication to be sent to? [place], [place] ? ?3. Do they need a 30 day or 90 day supply? 90 day  ? ?Patient is out of medication ?

## 2022-02-27 NOTE — Telephone Encounter (Signed)
Sent in refill

## 2022-02-27 NOTE — Telephone Encounter (Signed)
Patient called needing Rx refilled Hydrocodone. Patient said he is out of his medication and his wife requested a medication refill through [facility].  Patient asked if he can get the Rx filled today. ? The number to contact patient is [phone] ?

## 2022-03-03 ENCOUNTER — Encounter: Payer: Self-pay | Admitting: Family

## 2022-03-03 ENCOUNTER — Ambulatory Visit (INDEPENDENT_AMBULATORY_CARE_PROVIDER_SITE_OTHER): Payer: Medicare HMO | Admitting: Family

## 2022-03-03 ENCOUNTER — Other Ambulatory Visit: Payer: Self-pay | Admitting: Family

## 2022-03-03 DIAGNOSIS — T79A22D Traumatic compartment syndrome of left lower extremity, subsequent encounter: Secondary | ICD-10-CM

## 2022-03-03 DIAGNOSIS — S81802D Unspecified open wound, left lower leg, subsequent encounter: Secondary | ICD-10-CM

## 2022-03-03 MED ORDER — HYDROCODONE-ACETAMINOPHEN 5-325 MG PO TABS: 1.0000 | ORAL_TABLET | Freq: Four times a day (QID) | ORAL | 0 refills | Status: DC | PRN

## 2022-03-03 NOTE — Progress Notes (Signed)
? ?Office Visit Note ?  ?Patient: [name]           ?Date of Birth: 05/06/1956           ?MRN: [id number] ?Visit Date: 03/03/2022 ?             ?Requested by: [name], MD ?[address] ?[address] ?[place],  [place] [id number] ?PCP: [name], MD ? ?Chief Complaint  ?Patient presents with  ? Left Leg - Routine Post Op  ?  01/02/22 LLE deb w/ kerecis graft  ? ? ? ? ?HPI: ?Patient is a 66 year old gentleman who presents in follow-up status post Kerecis tissue graft to the left lower extremity wound.  The patient is quite pleased with his progress he has been applying 4 x 4's ABD pads Kerlix and an Ace wrap.  States he does occasionally wear his compression garment compression stocking over top of this. ? ?Assessment & Plan: ?Visit Diagnoses:  ?1. Leg wound, left, subsequent encounter   ?2. Compartment syndrome of left lower extremity, subsequent encounter   ? ? ?Plan: Quite pleased with his progress.  He will continue with current dressing with 4 x 4's ABD Ace wrap and a compression sock.  ? ?Follow-Up Instructions: No follow-ups on file.  ? ?Ortho Exam ? ?Patient is alert, oriented, no adenopathy, well-dressed, normal affect, normal respiratory effort. ?Examination there is stable moderate edema.  No erythema or weeping.  In the wound bed there is improved granulation tissue there circumferential epithelialization.  There is no active drainage no erythema or warmth no odor. ? ?Please see attached photo ? ?Imaging: ?No results found. ?No images are attached to the encounter. ? ?  ? ?Labs: ?Lab Results  ?Component Value Date  ? HGBA1C 5.4 01/29/2022  ? HGBA1C 5.2 03/16/2019  ? REPTSTATUS 01/07/2022 FINAL 01/02/2022  ? REPTSTATUS 01/08/2022 FINAL 01/02/2022  ? GRAMSTAIN  01/02/2022  ?  FEW WBC PRESENT,BOTH PMN AND MONONUCLEAR ?RARE GRAM POSITIVE COCCI ?RARE GRAM NEGATIVE RODS ?Performed at [facility], [place] [address] [place], [place] [address] ?  ? GRAMSTAIN  01/02/2022  ?  FEW WBC PRESENT,BOTH  PMN AND MONONUCLEAR ?NO ORGANISMS SEEN ?  ? CULT  01/02/2022  ?  FEW STREPTOCOCCUS GROUP C ?Beta hemolytic streptococci are predictably susceptible to penicillin and other beta lactams. Susceptibility testing not routinely performed. ?ABUNDANT KLEBSIELLA OXYTOCA ?ABUNDANT STENOTROPHOMONAS MALTOPHILIA ?  ? CULT  01/02/2022  ?  NO ANAEROBES ISOLATED ?Performed at [facility], [place] [address], [place], [place] [address] ?  ? LABORGA KLEBSIELLA OXYTOCA 01/02/2022  ? [place] 01/02/2022  ? ? ? ?Lab Results  ?Component Value Date  ? ALBUMIN 2.5 (L) 01/30/2022  ? ALBUMIN 3.1 (L) 01/29/2022  ? ALBUMIN 4.1 09/22/2021  ? ? ?Lab Results  ?Component Value Date  ? MG 2.1 01/29/2022  ? MG 2.1 06/19/2019  ? MG 2.2 03/25/2019  ? ?No results found for: VD25OH ? ?No results found for: PREALBUMIN ? ?  Latest Ref Rng & Units 02/04/2022  ?  8:39 AM 01/31/2022  ? 11:41 AM 01/31/2022  ?  3:38 AM  ?CBC EXTENDED  ?WBC 4.0 - 10.5 K/uL 8.0    7.3    ?RBC 4.22 - 5.81 Mil/uL 3.02    2.46    ?Hemoglobin 13.0 - 17.0 g/dL 8.4 Repeated and verified X2.   7.6   6.7    ?HCT 39.0 - 52.0 % 25.8 Repeated and verified X2.  23.4   20.9    ?Platelets 150.0 - 400.0 K/uL 270.0    206    ? ? ? ?There is no height or weight on file to calculate BMI. ? ?Orders:  ?No orders of the defined types were placed in this encounter. ? ?No orders of the defined types were placed in this encounter. ? ? ? Procedures: ?No procedures performed ? ?Clinical Data: ?No additional findings. ? ?ROS: ? ?All other systems negative, except as noted in the HPI. ?Review of Systems ? ?Objective: ?Vital Signs: There were no vitals taken for this visit. ? ?Specialty Comments:  ?No specialty comments available. ? ?PMFS History: ?Patient Active Problem List  ? Diagnosis Date Noted  ? Rectal bleeding 01/30/2022  ? Abnormal finding on imaging 01/30/2022  ? GIB (gastrointestinal bleeding) 01/29/2022  ? Hyponatremia 01/29/2022  ? Hypocalcemia 01/29/2022  ? Alcohol  abuse 01/29/2022  ? AKI (acute kidney injury) ([place]) 01/29/2022  ? Symptomatic anemia 01/29/2022  ? Venous stasis dermatitis of both lower extremities   ? Bleeding on Coumadin   ? Leg wound, left, subsequent encounter 12/30/2021  ? Compartment syndrome of left lower extremity ([place]) 09/22/2021  ? Status post surgery 09/22/2021  ? Compartment syndrome of left upper extremity ([place]) 09/22/2021  ? Hyperlipidemia 12/13/2020  ? Atrial fibrillation ([place]) 06/10/2020  ? Long term (current) use of anticoagulants 06/10/2020  ? Secondary hypercoagulable state ([place]) 04/24/2020  ? AV junctional bradycardia 03/22/2019  ? S/P minimally invasive mitral valve repair  03/21/2019  ? S/P Maze operation for atrial fibrillation 03/21/2019  ? Dilated aortic root ([place]) 01/04/2019  ? Dilated cardiomyopathy ([place]) 01/04/2019  ? Atrial flutter with rapid ventricular response ([place])   ? Mitral valve prolapse   ? Hypertensive urgency 12/30/2018  ? Acute on chronic systolic (congestive) heart failure ([place]) 12/30/2018  ? Acute systolic heart failure ([place]) 12/30/2018  ? Chronic systolic (congestive) heart failure (HCC)   ? Persistent atrial fibrillation ([place]) 12/02/2018  ? Right hamstring muscle strain 07/20/2018  ? Essential hypertension 02/07/2016  ? Severe mitral regurgitation   ? Annual physical exam 11/16/2014  ? Arthritis of left knee 10/23/2014  ? Status post total left knee replacement 10/23/2014  ? Arthritis of knee, right 01/19/2014  ? Status post total knee replacement 01/19/2014  ? Benign paroxysmal positional vertigo 10/30/2013  ? Testicular hypofunction 03/24/2012  ? ?Past Medical History:  ?Diagnosis Date  ? Acute on chronic systolic (congestive) heart failure ([place]) 12/30/2018  ? Allergy   ? Arthritis   ? Atrial flutter with rapid ventricular response ([place])   ? Chronic systolic (congestive) heart failure (HCC)   ? COVID 10/2021  ? mild  ? Dilated aortic root ([place]) 01/04/2019  ? Dilated cardiomyopathy ([place]) 01/04/2019  ? GERD  (gastroesophageal reflux disease)   ? history of  ? Heart murmur   ? History of colon polyps   ? Hypertension   ? Insomnia   ? Mitral valve prolapse   ? Mitral valve regurgitation   ? Persistent atrial fibrillation ([place]) 12/02/2018  ? S/P Maze operation for atrial fibrillation 03/21/2019  ? Complete bilateral atrial lesion set using cryothermy and bipolar radiofrequency ablation with clipping of LA appendage via right mini thoracotomy approach  ? S/P minimally invasive mitral valve repair 03/21/2019  ? Complex valvuloplasty including triangular resection of posterior leaflet, artificial Gore-tex neochord placement x4 and 36 mm Sorin Memo 4D ring annuloplasty via right mini thoracotomy approach  ? Seizures ([place])   ? had one approx. [place]  yrs ago,has not had any since  ? Severe mitral regurgitation   ?  ?Family History  ?Problem Relation Age of Onset  ? Hypertension Father   ? Colon cancer Father   ?     dx in his late 49's  ? Diabetes Mother   ? Stomach cancer Neg Hx   ? Esophageal cancer Neg Hx   ? Pancreatic cancer Neg Hx   ? Rectal cancer Neg Hx   ?  ?Past Surgical History:  ?Procedure Laterality Date  ? ANKLE FUSION  left  ? Dec. 2012  ? APPLICATION OF WOUND VAC Left 09/22/2021  ? Procedure: APPLICATION OF WOUND VAC;  Surgeon: Mcarthur Rossetti, MD;  Location: [facility];  Service: Orthopedics;  Laterality: Left;  ? APPLICATION OF WOUND VAC Left 12/30/2021  ? Procedure: APPLICATION OF WOUND VAC;  Surgeon: Mcarthur Rossetti, MD;  Location: [facility];  Service: Orthopedics;  Laterality: Left;  ? ARTHROSCOPY KNEE W/ DRILLING  bilateral  ? 2012  ? BIOPSY  01/30/2022  ? Procedure: BIOPSY;  Surgeon: Irving Copas., MD;  Location[facility];  Service: Gastroenterology;;  ? CARDIAC VALVE REPLACEMENT    ? CARDIOVERSION N/A 01/03/2019  ? Procedure: CARDIOVERSION;  Surgeon: Sueanne Margarita, MD;  Location: [facility];  Service: Cardiovascular;  Laterality: N/A;  ? CARDIOVERSION N/A 06/23/2019  ? Procedure:  CARDIOVERSION;  Surgeon: Fay Records, MD;  Location: [facility];  Service: Cardiovascular;  Laterality: N/A;  ? CLIPPING OF ATRIAL APPENDAGE  03/21/2019  ? Procedure: Clipping Of Atrial Appendage using 68m Atricure Pro

## 2022-03-17 ENCOUNTER — Ambulatory Visit (INDEPENDENT_AMBULATORY_CARE_PROVIDER_SITE_OTHER): Payer: Medicare HMO | Admitting: Family

## 2022-03-17 ENCOUNTER — Encounter: Payer: Self-pay | Admitting: Family

## 2022-03-17 ENCOUNTER — Other Ambulatory Visit: Payer: Self-pay | Admitting: Family

## 2022-03-17 DIAGNOSIS — S81802D Unspecified open wound, left lower leg, subsequent encounter: Secondary | ICD-10-CM

## 2022-03-17 DIAGNOSIS — T79A22D Traumatic compartment syndrome of left lower extremity, subsequent encounter: Secondary | ICD-10-CM

## 2022-03-17 MED ORDER — HYDROCODONE-ACETAMINOPHEN 5-325 MG PO TABS: 1.0000 | ORAL_TABLET | Freq: Four times a day (QID) | ORAL | 0 refills | Status: DC | PRN

## 2022-03-17 NOTE — Progress Notes (Signed)
? ?Post-Op Visit Note ?  ?Patient: [name]           ?Date of Birth: 05-05-1956           ?MRN: [id number] ?Visit Date: 03/17/2022 ?PCP: [name], MD ? ?Chief Complaint:  ?Chief Complaint  ?Patient presents with  ? Left Leg - Routine Post Op  ?  01/02/22 LLE deb w/ kerecis graft  ? ? ?HPI:  ?HPI ?The patient is a 66 year old gentleman who is seen status post left lower extremity debridement with Kerecis graft placement he has been doing daily Dial soap cleansing and applying 4 x 4 ABDs and Ace wrap's.  He wears a compression stocking over the top of this ?Ortho Exam ?On examination of left lower extremity wound this continues to improve very pleased with the progress there is no depth this is filled in with 100% granulation there is a new island of epithelialization as well.  Please see attached photo. ? ?Visit Diagnoses: No diagnosis found. ? ?Plan: Continue with current therapy.  Pleased with progress he will follow-up in the office in 2 to 3 weeks sooner should he have any concerns ? ?Follow-Up Instructions: Return in about 2 weeks (around 03/31/2022).  ? ?Imaging: ?No results found. ? ?Orders:  ?No orders of the defined types were placed in this encounter. ? ?No orders of the defined types were placed in this encounter. ? ? ? ?PMFS History: ?Patient Active Problem List  ? Diagnosis Date Noted  ? Rectal bleeding 01/30/2022  ? Abnormal finding on imaging 01/30/2022  ? GIB (gastrointestinal bleeding) 01/29/2022  ? Hyponatremia 01/29/2022  ? Hypocalcemia 01/29/2022  ? Alcohol abuse 01/29/2022  ? AKI (acute kidney injury) ([place]) 01/29/2022  ? Symptomatic anemia 01/29/2022  ? Venous stasis dermatitis of both lower extremities   ? Bleeding on Coumadin   ? Leg wound, left, subsequent encounter 12/30/2021  ? Compartment syndrome of left lower extremity ([place]) 09/22/2021  ? Status post surgery 09/22/2021  ? Compartment syndrome of left upper extremity ([place]) 09/22/2021  ? Hyperlipidemia 12/13/2020  ? Atrial  fibrillation ([place]) 06/10/2020  ? Long term (current) use of anticoagulants 06/10/2020  ? Secondary hypercoagulable state ([place]) 04/24/2020  ? AV junctional bradycardia 03/22/2019  ? S/P minimally invasive mitral valve repair  03/21/2019  ? S/P Maze operation for atrial fibrillation 03/21/2019  ? Dilated aortic root ([place]) 01/04/2019  ? Dilated cardiomyopathy ([place]) 01/04/2019  ? Atrial flutter with rapid ventricular response ([place])   ? Mitral valve prolapse   ? Hypertensive urgency 12/30/2018  ? Acute on chronic systolic (congestive) heart failure ([place]) 12/30/2018  ? Acute systolic heart failure ([place]) 12/30/2018  ? Chronic systolic (congestive) heart failure (HCC)   ? Persistent atrial fibrillation ([place]) 12/02/2018  ? Right hamstring muscle strain 07/20/2018  ? Essential hypertension 02/07/2016  ? Severe mitral regurgitation   ? Annual physical exam 11/16/2014  ? Arthritis of left knee 10/23/2014  ? Status post total left knee replacement 10/23/2014  ? Arthritis of knee, right 01/19/2014  ? Status post total knee replacement 01/19/2014  ? Benign paroxysmal positional vertigo 10/30/2013  ? Testicular hypofunction 03/24/2012  ? ?Past Medical History:  ?Diagnosis Date  ? Acute on chronic systolic (congestive) heart failure ([place]) 12/30/2018  ? Allergy   ? Arthritis   ? Atrial flutter with rapid ventricular response ([place])   ? Chronic systolic (congestive) heart failure (HCC)   ? COVID 10/2021  ? mild  ? Dilated aortic root ([place]) 01/04/2019  ? Dilated  cardiomyopathy ([place]) 01/04/2019  ? GERD (gastroesophageal reflux disease)   ? history of  ? Heart murmur   ? History of colon polyps   ? Hypertension   ? Insomnia   ? Mitral valve prolapse   ? Mitral valve regurgitation   ? Persistent atrial fibrillation ([place]) 12/02/2018  ? S/P Maze operation for atrial fibrillation 03/21/2019  ? Complete bilateral atrial lesion set using cryothermy and bipolar radiofrequency ablation with clipping of LA appendage via right mini thoracotomy  approach  ? S/P minimally invasive mitral valve repair 03/21/2019  ? Complex valvuloplasty including triangular resection of posterior leaflet, artificial Gore-tex neochord placement x4 and 36 mm Sorin Memo 4D ring annuloplasty via right mini thoracotomy approach  ? Seizures ([place])   ? had one approx. 30 yrs ago,has not had any since  ? Severe mitral regurgitation   ?  ?Family History  ?Problem Relation Age of Onset  ? Hypertension Father   ? Colon cancer Father   ?     dx in his late 62's  ? Diabetes Mother   ? Stomach cancer Neg Hx   ? Esophageal cancer Neg Hx   ? Pancreatic cancer Neg Hx   ? Rectal cancer Neg Hx   ?  ?Past Surgical History:  ?Procedure Laterality Date  ? ANKLE FUSION  left  ? Dec. 2012  ? APPLICATION OF WOUND VAC Left 09/22/2021  ? Procedure: APPLICATION OF WOUND VAC;  Surgeon: Mcarthur Rossetti, MD;  Location: [facility];  Service: Orthopedics;  Laterality: Left;  ? APPLICATION OF WOUND VAC Left 12/30/2021  ? Procedure: APPLICATION OF WOUND VAC;  Surgeon: Mcarthur Rossetti, MD;  Location: [facility];  Service: Orthopedics;  Laterality: Left;  ? ARTHROSCOPY KNEE W/ DRILLING  bilateral  ? 2012  ? BIOPSY  01/30/2022  ? Procedure: BIOPSY;  Surgeon: Irving Copas., MD;  Location[facility];  Service: Gastroenterology;;  ? CARDIAC VALVE REPLACEMENT    ? CARDIOVERSION N/A 01/03/2019  ? Procedure: CARDIOVERSION;  Surgeon: Sueanne Margarita, MD;  Location: [facility];  Service: Cardiovascular;  Laterality: N/A;  ? CARDIOVERSION N/A 06/23/2019  ? Procedure: CARDIOVERSION;  Surgeon: Fay Records, MD;  Location: [facility];  Service: Cardiovascular;  Laterality: N/A;  ? CLIPPING OF ATRIAL APPENDAGE  03/21/2019  ? Procedure: Clipping Of Atrial Appendage using 41m Atricure Pro2 Clip;  Surgeon: ORexene Alberts MD;  [facility];  Service: Open Heart Surgery;;  ? COLONOSCOPY    ? x2  ? COLONOSCOPY N/A 01/30/2022  ? Procedure: COLONOSCOPY;  Surgeon: MRush LandmarkGTelford Nab, MD;  [facility];  Service: Gastroenterology;  Laterality: N/A;  ? ESOPHAGOGASTRODUODENOSCOPY N/A 01/30/2022  ? Procedure: ESOPHAGOGASTRODUODENOSCOPY (EGD);  Surgeon: MIrving Copas, MD;  [facility];  Service: Gastroenterology;  Laterality: N/A;  ? FOOT ARTHRODESIS  06/23/2012  ? Procedure: ARTHRODESIS FOOT;  Surgeon: JWylene Simmer MD;  [facility]  Service: Orthopedics;  Laterality: Left;  Left Subtalar and Talonavicular Joint Revision Arthrodesis  ?Aspiration of Bone Marrow from Left Hip   ? HAIR TRANSPLANT    ? HARDWARE REMOVAL  06/23/2012  ? Procedure: HARDWARE REMOVAL;  Surgeon: JWylene Simmer MD;  [facility]  Service: Orthopedics;  Laterality: Left;  Removal of Deep Implant  X's 3  ? I & D EXTREMITY Left 09/23/2021  ? Procedure: IRRIGATION AND DEBRIDEMENT OF LEG;  Surgeon: BMcarthur Rossetti MD;  [facility]  Service: Orthopedics;  Laterality: Left;  ? I & D EXTREMITY  Left 09/26/2021  ? Procedure: REPEAT IRRIGATION AND DEBRIDEMENT LEFT LEG, POSSIBLE WOUND CLOSURE, POSSIBLE VAC CHANGE, POSSIBLE SKIN GRAFT;  Surgeon: Mcarthur Rossetti, MD;  Location: [facility];  Service: Orthopedics;  Laterality: Left;  ? I & D EXTREMITY Left 12/30/2021  ? Procedure: IRRIGATION AND DEBRIDEMENT LEFT LOWER LEG WOUND;  Surgeon: Mcarthur Rossetti, MD;  Location: [facility];  Service: Orthopedics;  Laterality: Left;  ? I & D EXTREMITY Left 01/02/2022  ? Procedure: LEFT LEG DEBRIDEMENT AND TISSUE GRAFT;  Surgeon: Newt Minion, MD;  Location: [facility];  Service: Orthopedics;  Laterality: Left;  ? INCISION AND DRAINAGE WOUND WITH FASCIOTOMY Left 09/22/2021  ? Procedure: INCISION AND DRAINAGE WOUND WITH FASCIOTOMY;  Surgeon: Mcarthur Rossetti, MD;  Location: [facility];  Service: Orthopedics;  Laterality: Left;  ? INSERTION OF MESH N/A 07/10/2016  ? Procedure: INSERTION OF MESH;  Surgeon: Coralie Keens, MD;  Location: [facility];  Service: General;  Laterality: N/A;  ? JOINT REPLACEMENT    ?  right hip  01-2011  ? LEFT HEART CATH AND CORONARY ANGIOGRAPHY N/A 03/16/2019  ? Procedure: LEFT HEART CATH AND CORONARY ANGIOGRAPHY;  Surgeon: Sherren Mocha, MD;  Location: [facility];  Service: Cardiovascular

## 2022-03-18 ENCOUNTER — Ambulatory Visit: Payer: Medicare HMO | Admitting: Gastroenterology

## 2022-03-18 ENCOUNTER — Other Ambulatory Visit (INDEPENDENT_AMBULATORY_CARE_PROVIDER_SITE_OTHER): Payer: Medicare HMO

## 2022-03-18 ENCOUNTER — Encounter: Payer: Self-pay | Admitting: Gastroenterology

## 2022-03-18 VITALS — BP 110/72 | HR 91 | Ht 75.0 in | Wt 240.0 lb

## 2022-03-18 DIAGNOSIS — Z8719 Personal history of other diseases of the digestive system: Secondary | ICD-10-CM

## 2022-03-18 DIAGNOSIS — D62 Acute posthemorrhagic anemia: Secondary | ICD-10-CM

## 2022-03-18 DIAGNOSIS — K219 Gastro-esophageal reflux disease without esophagitis: Secondary | ICD-10-CM

## 2022-03-18 DIAGNOSIS — D509 Iron deficiency anemia, unspecified: Secondary | ICD-10-CM

## 2022-03-18 LAB — CBC WITH DIFFERENTIAL/PLATELET
Basophils Absolute: 0 10*3/uL (ref 0.0–0.1)
Basophils Relative: 0.7 % (ref 0.0–3.0)
Eosinophils Absolute: 0.2 10*3/uL (ref 0.0–0.7)
Eosinophils Relative: 3.6 % (ref 0.0–5.0)
HCT: 30 % — ABNORMAL LOW (ref 39.0–52.0)
Hemoglobin: 9.1 g/dL — ABNORMAL LOW (ref 13.0–17.0)
Lymphocytes Relative: 13.7 % (ref 12.0–46.0)
Lymphs Abs: 0.9 10*3/uL (ref 0.7–4.0)
MCHC: 30.4 g/dL (ref 30.0–36.0)
MCV: 79.1 fl (ref 78.0–100.0)
Monocytes Absolute: 0.5 10*3/uL (ref 0.1–1.0)
Monocytes Relative: 7.8 % (ref 3.0–12.0)
Neutro Abs: 4.8 10*3/uL (ref 1.4–7.7)
Neutrophils Relative %: 74.2 % (ref 43.0–77.0)
Platelets: 315 10*3/uL (ref 150.0–400.0)
RBC: 3.8 Mil/uL — ABNORMAL LOW (ref 4.22–5.81)
RDW: 18.7 % — ABNORMAL HIGH (ref 11.5–15.5)
WBC: 6.5 10*3/uL (ref 4.0–10.5)

## 2022-03-18 NOTE — Patient Instructions (Signed)
Your provider has requested that you go to the basement level for lab work before leaving today. Press "B" on the elevator. The lab is located at the first door on the left as you exit the elevator. ? ?The [facility] providers would like to encourage you to use [facility] to communicate with providers for non-urgent requests or questions.  Due to long hold times on the telephone, sending your provider a message by [facility] may be a faster and more efficient way to get a response.  Please allow 48 business hours for a response.  Please remember that this is for non-urgent requests.  ? ?Thank you for choosing me and [facility]. ? ?Malcolm T. Dagoberto Ligas., MD., [facility] ? ?

## 2022-03-18 NOTE — Progress Notes (Signed)
? ? ?Assessment   ?  ?Recent diverticular bleed, resolved ?ABL and iron deficiency anemia ?Personal history of adenomatous colon polyps and family history of colon cancer ?GERD, Schatzki ring ?Coumadin anticoagulation ? ? ?Recommendations  ?  ?Long-term high-fiber diet with adequate daily water intake ?Continue iron daily, check CBC today and follow-up with PCP for ongoing management ?Surveillance colonoscopy at 5 years in March 2028 ?Follow antireflux measures and pantoprazole 40 mg daily as needed ? ? ?HPI  ?  ?This is a 66 year old male with a PMH significant for status post MVR, A-fib (status post ablation and clipping), CHF, hypertension, hyperlipidemia, diverticulosis, colon polyps. He was hospitalized with dark stools and hematochezia and was found to have acute blood loss anemia and iron deficiency anemia on March 30.  He was felt to have a diverticular bleed.  Intravenous iron was given and he was discharged on oral iron.  He has not noted any recurrent bleeding.  He states he feels well and his energy level is improving. ? ?Colonoscopy 01/30/2022 ?- Hemorrhoids found on digital rectal exam. ?- The examined portion of the ileum was normal. ?- One 3 mm polyp in the ascending colon, removed with a cold snare. Resected and ?retrieved. Tubular adenoma ?- Blood in the recto-sigmoid colon and in the sigmoid colon. ?- Diverticulosis in the entire examined colon. ?- Normal mucosa in the entire examined colon otherwise. ?- Non-bleeding non-thrombosed external and internal hemorrhoids. ?- Etiology of bleeding seems to have been most likely diverticular in origin. ? ?EGD 01/30/2022 ?- No gross lesions in esophagus. Non-obstructing Schatzki ring. Z-line regular, 42 cm from ?the incisors. ?- 3 cm hiatal hernia. Erythematous mucosa in the stomach. Biopsied. ?- No gross lesions in the duodenal bulb, in the first portion of the duodenum and in the ?second portion of the duodenum. ? ? ?Labs / Imaging  ?  ? ?  Latest Ref Rng  & Units 01/30/2022  ?  3:57 AM 01/29/2022  ? 10:08 AM 09/22/2021  ?  6:20 PM  ?Hepatic Function  ?Total Protein 6.5 - 8.1 g/dL 4.4   5.4   6.2    ?Albumin 3.5 - 5.0 g/dL 2.5   3.1   4.1    ?AST 15 - 41 U/L '13   17   23    ' ?ALT 0 - 44 U/L '14   15   26    ' ?Alk Phosphatase 38 - 126 U/L 32   42   42    ?Total Bilirubin 0.3 - 1.2 mg/dL 0.6   0.6   1.3    ? ? ? ?  Latest Ref Rng & Units 02/04/2022  ?  8:39 AM 01/31/2022  ? 11:41 AM 01/31/2022  ?  3:38 AM  ?CBC  ?WBC 4.0 - 10.5 K/uL 8.0    7.3    ?Hemoglobin 13.0 - 17.0 g/dL 8.4 Repeated and verified X2.   7.6   6.7    ?Hematocrit 39.0 - 52.0 % 25.8 Repeated and verified X2.   23.4   20.9    ?Platelets 150.0 - 400.0 K/uL 270.0    206    ? ? ?No results found. ? ? ?Current Medications, Allergies, Past Medical History, Past Surgical History, Family History and Social History were reviewed in [employer] record. ? ? ?Physical Exam: ?General: Well developed, well nourished, no acute distress ?Head: Normocephalic and atraumatic ?Eyes: Sclerae anicteric, EOMI ?Ears: Normal auditory acuity ?Mouth: Not examined, mask on during Covid-19 pandemic ?  Lungs: Clear throughout to auscultation ?Heart: Regular rate and rhythm; no murmurs, rubs or bruits ?Abdomen: Soft, non tender and non distended. No masses, hepatosplenomegaly or hernias noted. Normal Bowel sounds ?Rectal: Not done ?Musculoskeletal: Symmetrical with no gross deformities  ?Pulses:  Normal pulses noted ?Extremities: No clubbing, cyanosis, edema or deformities noted ?Neurological: Alert oriented x 4, grossly nonfocal ?Psychological:  Alert and cooperative. Normal mood and affect ? ? ?Pricilla Riffle. Fuller Plan, MD 03/18/2022, 10:35 AM  ?

## 2022-03-24 ENCOUNTER — Other Ambulatory Visit: Payer: Self-pay | Admitting: Cardiovascular Disease

## 2022-03-26 ENCOUNTER — Ambulatory Visit (INDEPENDENT_AMBULATORY_CARE_PROVIDER_SITE_OTHER): Payer: Medicare HMO | Admitting: Pharmacist

## 2022-03-26 DIAGNOSIS — I4891 Unspecified atrial fibrillation: Secondary | ICD-10-CM

## 2022-03-26 DIAGNOSIS — Z7901 Long term (current) use of anticoagulants: Secondary | ICD-10-CM

## 2022-03-26 DIAGNOSIS — I4892 Unspecified atrial flutter: Secondary | ICD-10-CM | POA: Diagnosis not present

## 2022-03-26 DIAGNOSIS — Z5181 Encounter for therapeutic drug level monitoring: Secondary | ICD-10-CM

## 2022-03-26 DIAGNOSIS — I4819 Other persistent atrial fibrillation: Secondary | ICD-10-CM

## 2022-03-26 LAB — POCT INR: INR: 2.3 (ref 2.0–3.0)

## 2022-03-26 NOTE — Patient Instructions (Signed)
Description   Continue taking 1 tablet daily except 1.5 tablets on Wednesdays.  [phone]. INR 8 weeks      

## 2022-03-27 ENCOUNTER — Other Ambulatory Visit: Payer: Self-pay | Admitting: Family

## 2022-03-27 ENCOUNTER — Ambulatory Visit: Payer: Medicare HMO | Admitting: Cardiovascular Disease

## 2022-03-27 MED ORDER — HYDROCODONE-ACETAMINOPHEN 5-325 MG PO TABS: 1.0000 | ORAL_TABLET | Freq: Four times a day (QID) | ORAL | 0 refills | Status: DC | PRN

## 2022-04-02 ENCOUNTER — Other Ambulatory Visit: Payer: Self-pay | Admitting: Cardiovascular Disease

## 2022-04-05 ENCOUNTER — Other Ambulatory Visit: Payer: Self-pay | Admitting: Cardiovascular Disease

## 2022-04-07 ENCOUNTER — Ambulatory Visit (INDEPENDENT_AMBULATORY_CARE_PROVIDER_SITE_OTHER): Payer: Medicare HMO | Admitting: Orthopedic Surgery

## 2022-04-07 ENCOUNTER — Encounter: Payer: Self-pay | Admitting: Orthopedic Surgery

## 2022-04-07 DIAGNOSIS — S81802D Unspecified open wound, left lower leg, subsequent encounter: Secondary | ICD-10-CM

## 2022-04-07 NOTE — Progress Notes (Signed)
Office Visit Note   Patient: [name]           Date of Birth: 24-May-1956           MRN: [id number] Visit Date: 04/07/2022              Requested by: Douglas Limbo, MD [place],  [place] [id number] PCP: Douglas Limbo, MD  Chief Complaint  Patient presents with   Left Leg - Follow-up    01/02/2022 LLE debridement with [name]      HPI: Patient is a 67 year old gentleman who presents in follow-up status post tissue graft left lower extremity.  Patient started with initial debridement and fasciotomies in November 21st,  repeat surgery November 25 and February 28.  Patient underwent tissue graft on March 3.  Patient has had progressive healing which has stalled.  Assessment & Plan: Visit Diagnoses:  1. Leg wound, left, subsequent encounter     Plan: We will plan for return to the operating room for debridement application of Kerecis tissue graft and a wound VAC outpatient surgery.  Follow-up in the office 1 week to remove the VAC dressing.  Patient may return to work on Monday.  Follow-Up Instructions: Return in about 1 week (around 04/14/2022).   Ortho Exam  Patient is alert, oriented, no adenopathy, well-dressed, normal affect, normal respiratory effort. Examination the wound bed has healthy granulation tissue there is superficial epithelialization around the wound edges there is still some swelling.  No cellulitis no drainage no signs of infection.  Imaging: No results found.      Labs: Lab Results  Component Value Date   HGBA1C 5.4 01/29/2022   HGBA1C 5.2 03/16/2019   REPTSTATUS 01/07/2022 FINAL 01/02/2022   REPTSTATUS 01/08/2022 FINAL 01/02/2022   GRAMSTAIN  01/02/2022    FEW WBC PRESENT,BOTH PMN AND MONONUCLEAR RARE GRAM POSITIVE COCCI RARE GRAM NEGATIVE RODS Performed at [facility], [address] [place], [place] [address]    GRAMSTAIN  01/02/2022    FEW WBC PRESENT,BOTH PMN AND MONONUCLEAR NO ORGANISMS SEEN     CULT  01/02/2022    FEW STREPTOCOCCUS GROUP C Beta hemolytic streptococci are predictably susceptible to penicillin and other beta lactams. Susceptibility testing not routinely performed. ABUNDANT KLEBSIELLA OXYTOCA ABUNDANT STENOTROPHOMONAS MALTOPHILIA    CULT  01/02/2022    NO ANAEROBES ISOLATED Performed at [facility], [place] [address], [place], [place] [address]    LABORGA KLEBSIELLA OXYTOCA 01/02/2022   LABORGA STENOTROPHOMONAS MALTOPHILIA 01/02/2022     Lab Results  Component Value Date   ALBUMIN 2.5 (L) 01/30/2022   ALBUMIN 3.1 (L) 01/29/2022   ALBUMIN 4.1 09/22/2021    Lab Results  Component Value Date   MG 2.1 01/29/2022   MG 2.1 06/19/2019   MG 2.2 03/25/2019   No results found for: VD25OH  No results found for: PREALBUMIN    Latest Ref Rng & Units 03/18/2022   11:08 AM 02/04/2022    8:39 AM 01/31/2022   11:41 AM  CBC EXTENDED  WBC 4.0 - 10.5 K/uL 6.5   8.0     RBC 4.22 - 5.81 Mil/uL 3.80   3.02     Hemoglobin 13.0 - 17.0 g/dL 9.1 Repeated and verified X2.   8.4 Repeated and verified X2.   7.6    HCT 39.0 - 52.0 % 30.0   25.8 Repeated and verified X2.   23.4    Platelets 150.0 - 400.0 K/uL 315.0  270.0     NEUT# 1.4 - 7.7 K/uL 4.8      Lymph# 0.7 - 4.0 K/uL 0.9         There is no height or weight on file to calculate BMI.  Orders:  No orders of the defined types were placed in this encounter.  No orders of the defined types were placed in this encounter.    Procedures: No procedures performed  Clinical Data: No additional findings.  ROS:  All other systems negative, except as noted in the HPI. Review of Systems  Objective: Vital Signs: There were no vitals taken for this visit.  Specialty Comments:  No specialty comments available.  PMFS History: Patient Active Problem List   Diagnosis Date Noted   Rectal bleeding 01/30/2022   Abnormal finding on imaging 01/30/2022   GIB (gastrointestinal bleeding) 01/29/2022   Hyponatremia  01/29/2022   Hypocalcemia 01/29/2022   Alcohol abuse 01/29/2022   AKI (acute kidney injury) ([place]) 01/29/2022   Symptomatic anemia 01/29/2022   Venous stasis dermatitis of both lower extremities    Bleeding on Coumadin    Leg wound, left, subsequent encounter 12/30/2021   Compartment syndrome of left lower extremity ([place]) 09/22/2021   Status post surgery 09/22/2021   Compartment syndrome of left upper extremity ([place]) 09/22/2021   Hyperlipidemia 12/13/2020   Atrial fibrillation ([place]) 06/10/2020   Long term (current) use of anticoagulants 06/10/2020   Secondary hypercoagulable state ([place]) 04/24/2020   AV junctional bradycardia 03/22/2019   S/P minimally invasive mitral valve repair  03/21/2019   S/P Maze operation for atrial fibrillation 03/21/2019   Dilated aortic root ([place]) 01/04/2019   Dilated cardiomyopathy ([place]) 01/04/2019   Atrial flutter with rapid ventricular response ([place])    Mitral valve prolapse    Hypertensive urgency 12/30/2018   Acute on chronic systolic (congestive) heart failure ([place]) 88/41/6606   Acute systolic heart failure ([place]) 30/16/0109   Chronic systolic (congestive) heart failure (H[facility])    Persistent atrial fibrillation ([place]) 12/02/2018   Right hamstring muscle strain 07/20/2018   Essential hypertension 02/07/2016   Severe mitral regurgitation    Annual physical exam 11/16/2014   Arthritis of left knee 10/23/2014   Status post total left knee replacement 10/23/2014   Arthritis of knee, right 01/19/2014   Status post total knee replacement 01/19/2014   Benign paroxysmal positional vertigo 10/30/2013   Testicular hypofunction 03/24/2012   Past Medical History:  Diagnosis Date   Acute on chronic systolic (congestive) heart failure ([place]) 12/30/2018   Allergy    Arthritis    Atrial flutter with rapid ventricular response (H[facility])    Chronic systolic (congestive) heart failure ([place])    COVID 10/2021   mild   Dilated aortic root ([place]) 01/04/2019   Dilated  cardiomyopathy ([place]) 01/04/2019   GERD (gastroesophageal reflux disease)    history of   Heart murmur    History of colon polyps    Hypertension    Insomnia    Mitral valve prolapse    Mitral valve regurgitation    Persistent atrial fibrillation ([place]) 12/02/2018   S/P Maze operation for atrial fibrillation 03/21/2019   Complete bilateral atrial lesion set using cryothermy and bipolar radiofrequency ablation with clipping of LA appendage via right mini thoracotomy approach   S/P minimally invasive mitral valve repair 03/21/2019   Complex valvuloplasty including triangular resection of posterior leaflet, artificial Gore-tex neochord placement x4 and 36 mm Sorin Memo 4D ring annuloplasty via right mini thoracotomy approach   Seizures ([place])  had one approx. 30 yrs ago,has not had any since   Severe mitral regurgitation     Family History  Problem Relation Age of Onset   Hypertension Father    Colon cancer Father        dx in his late 98's   Diabetes Mother    Stomach cancer Neg Hx    Esophageal cancer Neg Hx    Pancreatic cancer Neg Hx    Rectal cancer Neg Hx     Past Surgical History:  Procedure Laterality Date   ANKLE FUSION  left   Dec. 7591   APPLICATION OF WOUND VAC Left 09/22/2021   Procedure: APPLICATION OF WOUND VAC;  Surgeon: Mcarthur Rossetti, MD;  Location: [facility];  Service: Orthopedics;  Laterality: Left;   APPLICATION OF WOUND VAC Left 12/30/2021   Procedure: APPLICATION OF WOUND VAC;  Surgeon: Mcarthur Rossetti, MD;  Location: [facility];  Service: Orthopedics;  Laterality: Left;   ARTHROSCOPY KNEE W/ DRILLING  bilateral   2012   BIOPSY  01/30/2022   Procedure: BIOPSY;  Surgeon: Rush Landmark Telford Nab., MD;  Location: [facility];  Service: Gastroenterology;;   CARDIAC VALVE REPLACEMENT     CARDIOVERSION N/A 01/03/2019   Procedure: CARDIOVERSION;  Surgeon: Sueanne Margarita, MD;  Location: [facility] ENDOSCOPY;  Service: Cardiovascular;  Laterality: N/A;    CARDIOVERSION N/A 06/23/2019   Procedure: CARDIOVERSION;  Surgeon: Fay Records, MD;  Location: [facility];  Service: Cardiovascular;  Laterality: N/A;   CLIPPING OF ATRIAL APPENDAGE  03/21/2019   Procedure: Clipping Of Atrial Appendage using 2m Atricure Pro2 Clip;  Surgeon: ORexene Alberts MD;  [place] [facility]  Service: Open Heart Surgery;;   COLONOSCOPY     x2   COLONOSCOPY N/A 01/30/2022   Procedure: COLONOSCOPY;  Surgeon: MIrving Copas, MD;  [facility];  Service: Gastroenterology;  Laterality: N/A;   ESOPHAGOGASTRODUODENOSCOPY N/A 01/30/2022   Procedure: ESOPHAGOGASTRODUODENOSCOPY (EGD);  Surgeon: MIrving Copas, MD;  [facility];  Service: Gastroenterology;  Laterality: N/A;   FOOT ARTHRODESIS  06/23/2012   Procedure: ARTHRODESIS FOOT;  Surgeon: JWylene Simmer MD;  [place] [facility]  Service: Orthopedics;  Laterality: Left;  Left Subtalar and Talonavicular Joint Revision Arthrodesis  Aspiration of Bone Marrow from Left Hip    HAIR TRANSPLANT     HARDWARE REMOVAL  06/23/2012   Procedure: HARDWARE REMOVAL;  Surgeon: JWylene Simmer MD;  [place] [facility]  Service: Orthopedics;  Laterality: Left;  Removal of Deep Implant  X's 3   I & D EXTREMITY Left 09/23/2021   Procedure: IRRIGATION AND DEBRIDEMENT OF LEG;  Surgeon: BMcarthur Rossetti MD;  [facility] [facility]  Service: Orthopedics;  Laterality: Left;   I & D EXTREMITY Left 09/26/2021   Procedure: REPEAT IRRIGATION AND DEBRIDEMENT LEFT LEG, POSSIBLE WOUND CLOSURE, POSSIBLE VAC CHANGE, POSSIBLE SKIN GRAFT;  Surgeon: BMcarthur Rossetti MD;  [place] [facility]  Service: Orthopedics;  Laterality: Left;   I & D EXTREMITY Left 12/30/2021   Procedure: IRRIGATION AND DEBRIDEMENT LEFT LOWER LEG WOUND;  Surgeon: BMcarthur Rossetti MD;  [place] [facility]  Service: Orthopedics;  Laterality: Left;   I & D EXTREMITY Left 01/02/2022   Procedure: LEFT LEG DEBRIDEMENT AND TISSUE GRAFT;  Surgeon: DNewt Minion  MD;  [place] [facility]  Service: Orthopedics;  Laterality: Left;   INCISION AND DRAINAGE WOUND WITH FASCIOTOMY Left 09/22/2021   Procedure: INCISION AND DRAINAGE WOUND WITH FASCIOTOMY;  Surgeon: BMcarthur Rossetti MD;  Location: [facility];  Service: Orthopedics;  Laterality: Left;   INSERTION OF MESH N/A 07/10/2016   Procedure: INSERTION OF MESH;  Surgeon: Coralie Keens, MD;  Location: [facility];  Service: General;  Laterality: N/A;   JOINT REPLACEMENT     right hip  01-2011   LEFT HEART CATH AND CORONARY ANGIOGRAPHY N/A 03/16/2019   Procedure: LEFT HEART CATH AND CORONARY ANGIOGRAPHY;  Surgeon: Sherren Mocha, MD;  Location: [facility];  Service: Cardiovascular;  Laterality: N/A;   LIMB SPARING RESECTION HIP W/ SADDLE JOINT REPLACEMENT Right    MINIMALLY INVASIVE MAZE PROCEDURE N/A 03/21/2019   Procedure: MINIMALLY INVASIVE MAZE PROCEDURE;  Surgeon: Rexene Alberts, MD;  Location: [facility];  Service: Open Heart Surgery;  Laterality: N/A;   MITRAL VALVE REPAIR Right 03/21/2019   Procedure: MINIMALLY INVASIVE MITRAL VALVE REPAIR (MVR) using Memo 4D ring size 36;  Surgeon: Rexene Alberts, MD;  Location: [facility];  Service: Open Heart Surgery;  Laterality: Right;   POLYPECTOMY  01/30/2022   Procedure: POLYPECTOMY;  Surgeon: Mansouraty, Telford Nab., MD;  Location: [facility];  Service: Gastroenterology;;   TEE WITHOUT CARDIOVERSION N/A 01/03/2019   Procedure: TRANSESOPHAGEAL ECHOCARDIOGRAM (TEE);  Surgeon: Sueanne Margarita, MD;  Location: [facility] ENDOSCOPY;  Service: Cardiovascular;  Laterality: N/A;   TEE WITHOUT CARDIOVERSION N/A 03/21/2019   Procedure: TRANSESOPHAGEAL ECHOCARDIOGRAM (TEE);  Surgeon: Rexene Alberts, MD;  Location: [facility];  Service: Open Heart Surgery;  Laterality: N/A;   TEMPORARY PACEMAKER N/A 03/22/2019   Procedure: TEMPORARY PACEMAKER;  Surgeon: Belva Crome, MD;  Location: [facility];  Service: Cardiovascular;  Laterality: N/A;   TOTAL KNEE ARTHROPLASTY  Right 01/19/2014   Procedure: RIGHT TOTAL KNEE ARTHROPLASTY, Steroid injection left knee;  Surgeon: Mcarthur Rossetti, MD;  [facility];  Service: Orthopedics;  Laterality: Right;   TOTAL KNEE ARTHROPLASTY Left 10/23/2014   Procedure: LEFT TOTAL KNEE ARTHROPLASTY;  Surgeon: Mcarthur Rossetti, MD;  [facility];  Service: Orthopedics;  Laterality: Left;   UMBILICAL HERNIA REPAIR N/A 07/10/2016   Procedure: UMBILICAL HERNIA REPAIR;  Surgeon: Coralie Keens, MD;  Location: [facility];  Service: General;  Laterality: N/A;   Social History   Occupational History   Occupation: Pension scheme manager  Tobacco Use   Smoking status: Never   Smokeless tobacco: Never  Vaping Use   Vaping Use: Never used  Substance and Sexual Activity   Alcohol use: Yes    Alcohol/week: 2.0 - 4.0 standard drinks    Types: 1 - 2 Glasses of wine, 1 - 2 Standard drinks or equivalent per week    Comment: 2 ounces of alcohol a day   Drug use: No   Sexual activity: Yes

## 2022-04-09 ENCOUNTER — Other Ambulatory Visit: Payer: Self-pay

## 2022-04-09 ENCOUNTER — Encounter (HOSPITAL_COMMUNITY): Payer: Self-pay | Admitting: Orthopedic Surgery

## 2022-04-09 NOTE — Progress Notes (Addendum)
[name] was not available, I spoke with [name], patients designated party and spouse.[name] reports that [name] has not complained of shortness of breath or chest pain.  [name] denies having any s/s of Covid in her household.  [name] denies any known exposure to Covid.   [name] was not instructed to stop Coumadin, I did not say to take it in am  [name] PCP is Dr. [name],  Cardiologist is Dr. Adora Fridge.  [name] was admitted on 01/29/22 with lower GI bleed which was do to diverticulosis, [name] was given 4 pints of blood.

## 2022-04-10 ENCOUNTER — Other Ambulatory Visit: Payer: Self-pay

## 2022-04-10 ENCOUNTER — Ambulatory Visit (HOSPITAL_COMMUNITY)
Admission: RE | Admit: 2022-04-10 | Discharge: 2022-04-10 | Disposition: A | Discharge status: Home / Self Care | Payer: Medicare HMO | Source: Ambulatory Visit | Attending: Orthopedic Surgery | Admitting: Orthopedic Surgery

## 2022-04-10 ENCOUNTER — Encounter (HOSPITAL_COMMUNITY)
Admission: RE | Disposition: A | Discharge status: Home / Self Care | Payer: Self-pay | Source: Ambulatory Visit | Attending: Orthopedic Surgery

## 2022-04-10 ENCOUNTER — Ambulatory Visit (HOSPITAL_COMMUNITY): Payer: Medicare HMO | Admitting: Physician Assistant

## 2022-04-10 ENCOUNTER — Ambulatory Visit (HOSPITAL_BASED_OUTPATIENT_CLINIC_OR_DEPARTMENT_OTHER): Payer: Medicare HMO | Admitting: Physician Assistant

## 2022-04-10 ENCOUNTER — Encounter (HOSPITAL_COMMUNITY): Payer: Self-pay | Admitting: Orthopedic Surgery

## 2022-04-10 DIAGNOSIS — M1990 Unspecified osteoarthritis, unspecified site: Secondary | ICD-10-CM | POA: Insufficient documentation

## 2022-04-10 DIAGNOSIS — I4891 Unspecified atrial fibrillation: Secondary | ICD-10-CM | POA: Diagnosis not present

## 2022-04-10 DIAGNOSIS — X58XXXA Exposure to other specified factors, initial encounter: Secondary | ICD-10-CM | POA: Insufficient documentation

## 2022-04-10 DIAGNOSIS — I509 Heart failure, unspecified: Secondary | ICD-10-CM | POA: Diagnosis not present

## 2022-04-10 DIAGNOSIS — I110 Hypertensive heart disease with heart failure: Secondary | ICD-10-CM

## 2022-04-10 DIAGNOSIS — S81802D Unspecified open wound, left lower leg, subsequent encounter: Secondary | ICD-10-CM

## 2022-04-10 DIAGNOSIS — S81802A Unspecified open wound, left lower leg, initial encounter: Secondary | ICD-10-CM

## 2022-04-10 DIAGNOSIS — D649 Anemia, unspecified: Secondary | ICD-10-CM | POA: Insufficient documentation

## 2022-04-10 DIAGNOSIS — I5022 Chronic systolic (congestive) heart failure: Secondary | ICD-10-CM | POA: Diagnosis not present

## 2022-04-10 DIAGNOSIS — F419 Anxiety disorder, unspecified: Secondary | ICD-10-CM | POA: Diagnosis not present

## 2022-04-10 DIAGNOSIS — Z952 Presence of prosthetic heart valve: Secondary | ICD-10-CM | POA: Insufficient documentation

## 2022-04-10 DIAGNOSIS — Z79899 Other long term (current) drug therapy: Secondary | ICD-10-CM | POA: Insufficient documentation

## 2022-04-10 DIAGNOSIS — K219 Gastro-esophageal reflux disease without esophagitis: Secondary | ICD-10-CM | POA: Diagnosis not present

## 2022-04-10 HISTORY — PX: SKIN SPLIT GRAFT: SHX444

## 2022-04-10 HISTORY — PX: APPLICATION OF WOUND VAC: SHX5189

## 2022-04-10 HISTORY — DX: Personal history of other medical treatment: Z92.89

## 2022-04-10 HISTORY — DX: Cardiac arrhythmia, unspecified: I49.9

## 2022-04-10 LAB — CBC
HCT: 34.4 % — ABNORMAL LOW (ref 39.0–52.0)
Hemoglobin: 9.7 g/dL — ABNORMAL LOW (ref 13.0–17.0)
MCH: 24.1 pg — ABNORMAL LOW (ref 26.0–34.0)
MCHC: 28.2 g/dL — ABNORMAL LOW (ref 30.0–36.0)
MCV: 85.6 fL (ref 80.0–100.0)
Platelets: 242 10*3/uL (ref 150–400)
RBC: 4.02 MIL/uL — ABNORMAL LOW (ref 4.22–5.81)
RDW: 17.3 % — ABNORMAL HIGH (ref 11.5–15.5)
WBC: 6 10*3/uL (ref 4.0–10.5)
nRBC: 0 % (ref 0.0–0.2)

## 2022-04-10 LAB — BASIC METABOLIC PANEL
Anion gap: 10 (ref 5–15)
BUN: 19 mg/dL (ref 8–23)
CO2: 25 mmol/L (ref 22–32)
Calcium: 8.6 mg/dL — ABNORMAL LOW (ref 8.9–10.3)
Chloride: 101 mmol/L (ref 98–111)
Creatinine, Ser: 0.94 mg/dL (ref 0.61–1.24)
GFR, Estimated: >60 mL/min (ref >60)
Glucose, Bld: 98 mg/dL (ref 70–99)
Potassium: 4.2 mmol/L (ref 3.5–5.1)
Sodium: 136 mmol/L (ref 135–145)

## 2022-04-10 LAB — PROTIME-INR
INR: 2.3 — ABNORMAL HIGH (ref 0.8–1.2)
Prothrombin Time: 25.4 seconds — ABNORMAL HIGH (ref 11.4–15.2)

## 2022-04-10 SURGERY — APPLICATION, GRAFT, SKIN, SPLIT-THICKNESS
Anesthesia: General | Site: Leg Lower | Laterality: Left

## 2022-04-10 MED ORDER — PROPOFOL 10 MG/ML IV BOLUS
INTRAVENOUS | Status: DC | PRN
  Administered 2022-04-10: 200 mg via INTRAVENOUS

## 2022-04-10 MED ORDER — FENTANYL CITRATE (PF) 250 MCG/5ML IJ SOLN
INTRAMUSCULAR | Status: DC | PRN
  Administered 2022-04-10 (×2): 25 ug via INTRAVENOUS

## 2022-04-10 MED ORDER — ONDANSETRON HCL 4 MG/2ML IJ SOLN
INTRAMUSCULAR | Status: AC
Start: 2022-04-10 — End: ?
  Filled 2022-04-10: qty 2

## 2022-04-10 MED ORDER — FENTANYL CITRATE (PF) 250 MCG/5ML IJ SOLN
INTRAMUSCULAR | Status: AC
  Filled 2022-04-10: qty 5

## 2022-04-10 MED ORDER — 0.9 % SODIUM CHLORIDE (POUR BTL) OPTIME
TOPICAL | Status: DC | PRN
  Administered 2022-04-10: 1000 mL

## 2022-04-10 MED ORDER — LIDOCAINE 2% (20 MG/ML) 5 ML SYRINGE
INTRAMUSCULAR | Status: AC
  Filled 2022-04-10: qty 5

## 2022-04-10 MED ORDER — AMISULPRIDE (ANTIEMETIC) 5 MG/2ML IV SOLN: 10.0000 mg | Freq: Once | INTRAVENOUS | Status: DC | PRN

## 2022-04-10 MED ORDER — OXYCODONE HCL 5 MG PO TABS: 5.0000 mg | ORAL_TABLET | Freq: Once | ORAL | Status: DC | PRN

## 2022-04-10 MED ORDER — DEXAMETHASONE SODIUM PHOSPHATE 10 MG/ML IJ SOLN
INTRAMUSCULAR | Status: AC
  Filled 2022-04-10: qty 1

## 2022-04-10 MED ORDER — LACTATED RINGERS IV SOLN
INTRAVENOUS | Status: DC
Start: 2022-04-10 — End: 2022-04-10

## 2022-04-10 MED ORDER — ACETAMINOPHEN 500 MG PO TABS
1000.0000 mg | ORAL_TABLET | Freq: Once | ORAL | Status: AC
  Administered 2022-04-10: 1000 mg via ORAL
  Filled 2022-04-10: qty 2

## 2022-04-10 MED ORDER — CHLORHEXIDINE GLUCONATE 0.12 % MT SOLN
15.0000 mL | Freq: Once | OROMUCOSAL | Status: AC
  Administered 2022-04-10: 15 mL via OROMUCOSAL
  Filled 2022-04-10: qty 15

## 2022-04-10 MED ORDER — PROPOFOL 10 MG/ML IV BOLUS
INTRAVENOUS | Status: AC
  Filled 2022-04-10: qty 20

## 2022-04-10 MED ORDER — ONDANSETRON HCL 4 MG/2ML IJ SOLN: 4.0000 mg | Freq: Once | INTRAMUSCULAR | Status: DC | PRN

## 2022-04-10 MED ORDER — OXYCODONE HCL 5 MG/5ML PO SOLN: 5.0000 mg | Freq: Once | ORAL | Status: DC | PRN

## 2022-04-10 MED ORDER — DEXAMETHASONE SODIUM PHOSPHATE 10 MG/ML IJ SOLN
INTRAMUSCULAR | Status: DC | PRN
  Administered 2022-04-10: 10 mg via INTRAVENOUS

## 2022-04-10 MED ORDER — HYDROMORPHONE HCL 1 MG/ML IJ SOLN: 0.2500 mg | INTRAMUSCULAR | Status: DC | PRN

## 2022-04-10 MED ORDER — HYDROCODONE-ACETAMINOPHEN 5-325 MG PO TABS: 1.0000 | ORAL_TABLET | Freq: Four times a day (QID) | ORAL | 0 refills | Status: DC | PRN

## 2022-04-10 MED ORDER — LIDOCAINE 2% (20 MG/ML) 5 ML SYRINGE
INTRAMUSCULAR | Status: DC | PRN
  Administered 2022-04-10: 100 mg via INTRAVENOUS

## 2022-04-10 MED ORDER — ONDANSETRON HCL 4 MG/2ML IJ SOLN
INTRAMUSCULAR | Status: DC | PRN
  Administered 2022-04-10: 4 mg via INTRAVENOUS

## 2022-04-10 MED ORDER — CEFAZOLIN SODIUM-DEXTROSE 2-4 GM/100ML-% IV SOLN
2.0000 g | INTRAVENOUS | Status: AC
  Administered 2022-04-10: 2 g via INTRAVENOUS
  Filled 2022-04-10: qty 100

## 2022-04-10 MED ORDER — ORAL CARE MOUTH RINSE: 15.0000 mL | Freq: Once | OROMUCOSAL | Status: AC

## 2022-04-10 SURGICAL SUPPLY — 37 items
BAG COUNTER SPONGE SURGICOUNT (BAG) ×3 IMPLANT
BNDG COHESIVE 6X5 TAN STRL LF (GAUZE/BANDAGES/DRESSINGS) IMPLANT
BNDG ESMARK 4X9 LF (GAUZE/BANDAGES/DRESSINGS) ×2 IMPLANT
BNDG GAUZE ELAST 4 BULKY (GAUZE/BANDAGES/DRESSINGS) IMPLANT
CANISTER PREVENA PLUS 150 (CANNISTER) ×3 IMPLANT
COVER SURGICAL LIGHT HANDLE (MISCELLANEOUS) ×6 IMPLANT
DRAPE DERMATAC (DRAPES) ×2 IMPLANT
DRAPE U-SHAPE 47X51 STRL (DRAPES) ×3 IMPLANT
DRESSING VERAFLO CLEANSE CC (GAUZE/BANDAGES/DRESSINGS) IMPLANT
DRSG ADAPTIC 3X8 NADH LF (GAUZE/BANDAGES/DRESSINGS) IMPLANT
DRSG VERAFLO CLEANSE CC (GAUZE/BANDAGES/DRESSINGS) ×3
DURAPREP 26ML APPLICATOR (WOUND CARE) ×3 IMPLANT
ELECT REM PT RETURN 9FT ADLT (ELECTROSURGICAL) ×3
ELECTRODE REM PT RTRN 9FT ADLT (ELECTROSURGICAL) ×2 IMPLANT
GAUZE SPONGE 4X4 12PLY STRL (GAUZE/BANDAGES/DRESSINGS) IMPLANT
GLOVE BIOGEL PI IND STRL 9 (GLOVE) ×2 IMPLANT
GLOVE BIOGEL PI INDICATOR 9 (GLOVE) ×1
GLOVE SURG ORTHO 9.0 STRL STRW (GLOVE) ×3 IMPLANT
GOWN STRL REUS W/ TWL XL LVL3 (GOWN DISPOSABLE) ×4 IMPLANT
GOWN STRL REUS W/TWL XL LVL3 (GOWN DISPOSABLE) ×6
GRAFT SKIN WND MICRO 38 (Tissue) ×2 IMPLANT
GRAFT SKIN WND OMEGA3 SB 7X10 (Tissue) ×2 IMPLANT
KIT BASIN OR (CUSTOM PROCEDURE TRAY) ×3 IMPLANT
KIT DRSG PREVENA PLUS 7DAY 125 (MISCELLANEOUS) ×1 IMPLANT
KIT TURNOVER KIT B (KITS) ×3 IMPLANT
MANIFOLD NEPTUNE II (INSTRUMENTS) ×2 IMPLANT
NS IRRIG 1000ML POUR BTL (IV SOLUTION) ×3 IMPLANT
PACK ORTHO EXTREMITY (CUSTOM PROCEDURE TRAY) ×3 IMPLANT
PAD ARMBOARD 7.5X6 YLW CONV (MISCELLANEOUS) ×6 IMPLANT
PAD CAST 4YDX4 CTTN HI CHSV (CAST SUPPLIES) IMPLANT
PAD NEG PRESSURE SENSATRAC (MISCELLANEOUS) ×1 IMPLANT
PADDING CAST COTTON 4X4 STRL (CAST SUPPLIES)
SUCTION FRAZIER HANDLE 10FR (MISCELLANEOUS)
SUCTION TUBE FRAZIER 10FR DISP (MISCELLANEOUS) IMPLANT
TOWEL GREEN STERILE FF (TOWEL DISPOSABLE) ×3 IMPLANT
TUBE CONNECTING 12X1/4 (SUCTIONS) IMPLANT
WATER STERILE IRR 1000ML POUR (IV SOLUTION) ×3 IMPLANT

## 2022-04-10 NOTE — Transfer of Care (Signed)
Immediate Anesthesia Transfer of Care Note  Patient: [name]  Procedure(s) Performed: APPLY SKIN GRAFT TO LEFT LEG WOUND (Left) APPLICATION OF WOUND VAC (Left: Leg Lower)  Patient Location: PACU  Anesthesia Type:General  Level of Consciousness: awake, alert  and oriented  Airway & Oxygen Therapy: Patient Spontanous Breathing and Patient connected to face mask oxygen  Post-op Assessment: Report given to RN, Post -op Vital signs reviewed and stable, Patient moving all extremities X 4 and Patient able to stick tongue midline  Post vital signs: Reviewed  Last Vitals:  Vitals Value Taken Time  BP 127/74   Temp 98.4   Pulse 58 04/10/22 1053  Resp 17 04/10/22 1053  SpO2 100 % 04/10/22 1053  Vitals shown include unvalidated device data.  Last Pain:  Vitals:   04/10/22 0746  TempSrc: Oral         Complications: No notable events documented.

## 2022-04-10 NOTE — Anesthesia Preprocedure Evaluation (Signed)
Anesthesia Evaluation  Patient identified by MRN, date of birth, ID band Patient awake    Reviewed: Allergy & Precautions, H&P , NPO status , Patient's Chart, lab work & pertinent test results, reviewed documented beta blocker date and time   History of Anesthesia Complications Negative for: history of anesthetic complications  Airway Mallampati: II  TM Distance: >3 FB Neck ROM: Full    Dental  (+) Dental Advisory Given, Chipped,    Pulmonary neg pulmonary ROS,    Pulmonary exam normal        Cardiovascular hypertension, Pt. on medications and Pt. on home beta blockers +CHF  Normal cardiovascular exam+ dysrhythmias Atrial Fibrillation + Valvular Problems/Murmurs (s/p MVR) MR    Echo 08/2021: EF 45-50%, no RWMA, mild LVH, mildly reduced RVSF, mod LAE/RAE, no AR/AS   Neuro/Psych Seizures -, Well Controlled,  Anxiety    GI/Hepatic Neg liver ROS, GERD  Medicated and Controlled,  Endo/Other  negative endocrine ROS  Renal/GU negative Renal ROS  negative genitourinary   Musculoskeletal  (+) Arthritis , Osteoarthritis,  Chronic left leg wound   Abdominal   Peds negative pediatric ROS (+)  Hematology  (+) Blood dyscrasia, anemia , Coumadin therapy- last dose 2/23   Anesthesia Other Findings   Reproductive/Obstetrics negative OB ROS                             Anesthesia Physical  Anesthesia Plan  ASA: 3  Anesthesia Plan: General   Post-op Pain Management: Tylenol PO (pre-op)*   Induction: Intravenous, Rapid sequence and Cricoid pressure planned  PONV Risk Score and Plan: 3 and Ondansetron, Dexamethasone, Midazolam and Treatment may vary due to age or medical condition  Airway Management Planned: LMA  Additional Equipment: None  Intra-op Plan:   Post-operative Plan: Extubation in OR  Informed Consent: I have reviewed the patients History and Physical, chart, labs and discussed the  procedure including the risks, benefits and alternatives for the proposed anesthesia with the patient or authorized representative who has indicated his/her understanding and acceptance.     Dental advisory given  Plan Discussed with:   Anesthesia Plan Comments:         Anesthesia Quick Evaluation

## 2022-04-10 NOTE — Op Note (Addendum)
04/10/2022  11:03 AM  PATIENT:  [name]    PRE-OPERATIVE DIAGNOSIS:  Left Leg Wound  POST-OPERATIVE DIAGNOSIS:  Same  PROCEDURE:  APPLY SKIN GRAFT TO LEFT LEG WOUND, APPLICATION OF WOUND VAC Surface area of the wound covered 350 cm.  SURGEON:  Newt Minion, MD  PHYSICIAN ASSISTANT:None ANESTHESIA:   General  PREOPERATIVE INDICATIONS:  [name] is a  66 y.o. male with a diagnosis of Left Leg Wound who failed conservative measures and elected for surgical management.    The risks benefits and alternatives were discussed with the patient preoperatively including but not limited to the risks of infection, bleeding, nerve injury, cardiopulmonary complications, the need for revision surgery, among others, and the patient was willing to proceed.  OPERATIVE IMPLANTS: Kerecis 70 cm tissue graft x2. Kerecis micro powder 38 cm. Cleanse choice wound VAC sponge x1.  '@ENCIMAGES'$ @  OPERATIVE FINDINGS: Good healthy granulation tissue no exposed bone or tendon.  OPERATIVE PROCEDURE: Patient brought the operating room and underwent a general anesthetic.  After adequate levels anesthesia were obtained patient's left lower extremity was prepped using DuraPrep draped into a sterile field a timeout was called.  A Cobb and periosteal elevator and 21 blade knife were used to debride the skin and soft tissue back to bleeding viable granulation tissue.  This was then covered with Kerecis micro powder covered with Kerecis sheet this was stapled in place.  Total surface of the of the wound was 350 cm.  This was connected to a cleanse choice wound VAC sponge secured with derma tack covered with Coban this had a good suction fit patient was extubated taken the PACU in stable condition.  Debridement type: Excisional Debridement  Side: left  Body Location: leg   Tools used for debridement: scalpel, curette, and rongeur  Pre-debridement Wound size (cm):   Length: 10        Width: 20    Depth: 1    Post-debridement Wound size (cm):   Length: 10        Width: 20     Depth: 1   Debridement depth beyond dead/damaged tissue down to healthy viable tissue: yes  Tissue layer involved: skin, subcutaneous tissue, muscle / fascia  Nature of tissue removed: Non-viable tissue  Irrigation volume: 1 liter     Irrigation fluid type: Normal Saline     DISCHARGE PLANNING:  Antibiotic duration: Preoperative antibiotics  Weightbearing: Weightbearing as tolerated  Pain medication: Prescription for Vicodin  Dressing care/ Wound VAC: Continue wound VAC for 1 week  Ambulatory devices: Crutches  Discharge to: Home.  Follow-up: In the office 1 week post operative.

## 2022-04-10 NOTE — H&P (Signed)
[name] is an 66 y.o. male.   Chief Complaint: Chronic wound left leg. HPI: Patient is a 66 year old gentleman who presents in follow-up status post tissue graft left lower extremity.  Patient started with initial debridement and fasciotomies in November 21st,  repeat surgery November 25 and February 28.  Patient underwent tissue graft on March 3.  Patient has had progressive healing which has stalled.  Past Medical History:  Diagnosis Date   Acute on chronic systolic (congestive) heart failure (HCC) 12/30/2018   Allergy    Arthritis    Atrial flutter with rapid ventricular response (HCC)    Chronic systolic (congestive) heart failure ([place])    COVID 10/2021   mild   Dilated aortic root ([place]) 01/04/2019   Dilated cardiomyopathy ([place]) 01/04/2019   Dysrhythmia    Heart murmur    History of blood transfusion    01/27/22   History of colon polyps    Hypertension    Insomnia    Mitral valve prolapse    Mitral valve regurgitation    Persistent atrial fibrillation ([place]) 12/02/2018   S/P Maze operation for atrial fibrillation 03/21/2019   Complete bilateral atrial lesion set using cryothermy and bipolar radiofrequency ablation with clipping of LA appendage via right mini thoracotomy approach   S/P minimally invasive mitral valve repair 03/21/2019   Complex valvuloplasty including triangular resection of posterior leaflet, artificial Gore-tex neochord placement x4 and 36 mm Sorin Memo 4D ring annuloplasty via right mini thoracotomy approach   Seizures ([place])    had one approx. 30 yrs ago,has not had any since; due to alchol and no sleep   Severe mitral regurgitation     Past Surgical History:  Procedure Laterality Date   ANKLE FUSION  left   Dec. 2376   APPLICATION OF WOUND VAC Left 09/22/2021   Procedure: APPLICATION OF WOUND VAC;  Surgeon: Mcarthur Rossetti, MD;  Location: [facility];  Service: Orthopedics;  Laterality: Left;   APPLICATION OF WOUND VAC Left 12/30/2021    Procedure: APPLICATION OF WOUND VAC;  Surgeon: Mcarthur Rossetti, MD;  Location: [place];  Service: Orthopedics;  Laterality: Left;   ARTHROSCOPY KNEE W/ DRILLING  bilateral   2012   BIOPSY  01/30/2022   Procedure: BIOPSY;  Surgeon: Rush Landmark Telford Nab., MD;  Location: [facility];  Service: Gastroenterology;;   CARDIAC VALVE REPLACEMENT     CARDIOVERSION N/A 01/03/2019   Procedure: CARDIOVERSION;  Surgeon: Sueanne Margarita, MD;  Location: [facility]COPY;  Service: Cardiovascular;  Laterality: N/A;   CARDIOVERSION N/A 06/23/2019   Procedure: CARDIOVERSION;  Surgeon: Fay Records, MD;  Location: [place];  Service: Cardiovascular;  Laterality: N/A;   CLIPPING OF ATRIAL APPENDAGE  03/21/2019   Procedure: Clipping Of Atrial Appendage using 58m Atricure Pro2 Clip;  Surgeon: ORexene Alberts MD;  [place]  Service: Open Heart Surgery;;   COLONOSCOPY     x2   COLONOSCOPY N/A 01/30/2022   Procedure: COLONOSCOPY;  Surgeon: MIrving Copas, MD;  [facility];  Service: Gastroenterology;  Laterality: N/A;   ESOPHAGOGASTRODUODENOSCOPY N/A 01/30/2022   Procedure: ESOPHAGOGASTRODUODENOSCOPY (EGD);  Surgeon: MIrving Copas, MD;  [facility];  Service: Gastroenterology;  Laterality: N/A;   FOOT ARTHRODESIS  06/23/2012   Procedure: ARTHRODESIS FOOT;  Surgeon: JWylene Simmer MD;  [place]  Service: Orthopedics;  Laterality: Left;  Left Subtalar and Talonavicular Joint Revision Arthrodesis  Aspiration of Bone Marrow from Left Hip    HAIR TRANSPLANT  HARDWARE REMOVAL  06/23/2012   Procedure: HARDWARE REMOVAL;  Surgeon: Wylene Simmer, MD;  Location: [place];  Service: Orthopedics;  Laterality: Left;  Removal of Deep Implant  X's 3   I & D EXTREMITY Left 09/23/2021   Procedure: IRRIGATION AND DEBRIDEMENT OF LEG;  Surgeon: Mcarthur Rossetti, MD;  Location: [place];  Service: Orthopedics;  Laterality: Left;   I & D EXTREMITY Left 09/26/2021   Procedure:  REPEAT IRRIGATION AND DEBRIDEMENT LEFT LEG, POSSIBLE WOUND CLOSURE, POSSIBLE VAC CHANGE, POSSIBLE SKIN GRAFT;  Surgeon: Mcarthur Rossetti, MD;  Location: [place];  Service: Orthopedics;  Laterality: Left;   I & D EXTREMITY Left 12/30/2021   Procedure: IRRIGATION AND DEBRIDEMENT LEFT LOWER LEG WOUND;  Surgeon: Mcarthur Rossetti, MD;  Location: [place];  Service: Orthopedics;  Laterality: Left;   I & D EXTREMITY Left 01/02/2022   Procedure: LEFT LEG DEBRIDEMENT AND TISSUE GRAFT;  Surgeon: Newt Minion, MD;  Location: [place];  Service: Orthopedics;  Laterality: Left;   INCISION AND DRAINAGE WOUND WITH FASCIOTOMY Left 09/22/2021   Procedure: INCISION AND DRAINAGE WOUND WITH FASCIOTOMY;  Surgeon: Mcarthur Rossetti, MD;  Location: [place];  Service: Orthopedics;  Laterality: Left;   INSERTION OF MESH N/A 07/10/2016   Procedure: INSERTION OF MESH;  Surgeon: Coralie Keens, MD;  Location: [place];  Service: General;  Laterality: N/A;   JOINT REPLACEMENT     right hip  01-2011   LEFT HEART CATH AND CORONARY ANGIOGRAPHY N/A 03/16/2019   Procedure: LEFT HEART CATH AND CORONARY ANGIOGRAPHY;  Surgeon: Sherren Mocha, MD;  Location: [place] [facility];  Service: Cardiovascular;  Laterality: N/A;   LIMB SPARING RESECTION HIP W/ SADDLE JOINT REPLACEMENT Right    MINIMALLY INVASIVE MAZE PROCEDURE N/A 03/21/2019   Procedure: MINIMALLY INVASIVE MAZE PROCEDURE;  Surgeon: Rexene Alberts, MD;  Location: [place];  Service: Open Heart Surgery;  Laterality: N/A;   MITRAL VALVE REPAIR Right 03/21/2019   Procedure: MINIMALLY INVASIVE MITRAL VALVE REPAIR (MVR) using Memo 4D ring size 36;  Surgeon: Rexene Alberts, MD;  Location: [place];  Service: Open Heart Surgery;  Laterality: Right;   POLYPECTOMY  01/30/2022   Procedure: POLYPECTOMY;  Surgeon: Mansouraty, Telford Nab., MD;  Location: [facility];  Service: Gastroenterology;;   TEE WITHOUT CARDIOVERSION N/A 01/03/2019   Procedure: TRANSESOPHAGEAL  ECHOCARDIOGRAM (TEE);  Surgeon: Sueanne Margarita, MD;  Location: [facility] ENDOSCOPY;  Service: Cardiovascular;  Laterality: N/A;   TEE WITHOUT CARDIOVERSION N/A 03/21/2019   Procedure: TRANSESOPHAGEAL ECHOCARDIOGRAM (TEE);  Surgeon: Rexene Alberts, MD;  Location: [place];  Service: Open Heart Surgery;  Laterality: N/A;   TEMPORARY PACEMAKER N/A 03/22/2019   Procedure: TEMPORARY PACEMAKER;  Surgeon: Belva Crome, MD;  Location: [place] [facility];  Service: Cardiovascular;  Laterality: N/A;   TOTAL KNEE ARTHROPLASTY Right 01/19/2014   Procedure: RIGHT TOTAL KNEE ARTHROPLASTY, Steroid injection left knee;  Surgeon: Mcarthur Rossetti, MD;  Location: [facility];  Service: Orthopedics;  Laterality: Right;   TOTAL KNEE ARTHROPLASTY Left 10/23/2014   Procedure: LEFT TOTAL KNEE ARTHROPLASTY;  Surgeon: Mcarthur Rossetti, MD;  Location: [facility];  Service: Orthopedics;  Laterality: Left;   UMBILICAL HERNIA REPAIR N/A 07/10/2016   Procedure: UMBILICAL HERNIA REPAIR;  Surgeon: Coralie Keens, MD;  Location: [place];  Service: General;  Laterality: N/A;    Family History  Problem Relation Age of Onset   Hypertension Father    Colon cancer Father  dx in his late 20's   Diabetes Mother    Stomach cancer Neg Hx    Esophageal cancer Neg Hx    Pancreatic cancer Neg Hx    Rectal cancer Neg Hx    Social History:  reports that he has never smoked. He has never used smokeless tobacco. He reports that he does not currently use alcohol after a past usage of about 21.0 standard drinks of alcohol per week. He reports that he does not use drugs.  Allergies:  Allergies  Allergen Reactions   Fentanyl Itching    Medications Prior to Admission  Medication Sig Dispense Refill   ALPRAZolam (XANAX) 0.5 MG tablet Take 0.5 mg by mouth 2 (two) times daily.     atorvastatin (LIPITOR) 20 MG tablet Take 20 mg by mouth every morning.     diphenhydrAMINE (BENADRYL) 2 % cream Apply 1 Application  topically 3 (three) times daily as needed (seasonal allergies.).     diphenhydrAMINE (BENADRYL) 25 mg capsule Take 25 mg by mouth every 6 (six) hours as needed (seasonal allergies.).     docusate sodium (COLACE) 100 MG capsule Take 300 mg by mouth in the morning.     ferrous gluconate (FERGON) 324 MG tablet Take 1 tablet (324 mg total) by mouth daily with breakfast. 30 tablet 3   HYDROcodone-acetaminophen (NORCO/VICODIN) 5-325 MG tablet Take 1 tablet by mouth every 6 (six) hours as needed for moderate pain. 60 tablet 0   losartan (COZAAR) 50 MG tablet Take 1 tablet by mouth once daily 30 tablet 0   meclizine (ANTIVERT) 25 MG tablet Take 50 mg by mouth in the morning.     Melatonin 10 MG TABS Take 10 mg by mouth at bedtime.     metoprolol tartrate (LOPRESSOR) 25 MG tablet Take 1/2 (one-half) tablet by mouth twice daily 30 tablet 0   Multiple Vitamin (MULTIVITAMIN WITH MINERALS) TABS tablet Take 1 tablet by mouth every morning.     naloxone (NARCAN) nasal spray 4 mg/0.1 mL Place 1 spray into the nose once as needed (opioid overdose).     pantoprazole (PROTONIX) 40 MG tablet Take 1 tablet (40 mg total) by mouth daily. 30 tablet 3   potassium chloride (KLOR-CON M) 10 MEQ tablet TAKE 1 TABLET BY MOUTH TWICE DAILY . APPOINTMENT REQUIRED FOR FUTURE REFILLS 90 tablet 0   torsemide (DEMADEX) 20 MG tablet Take 1 tablet by mouth once daily 90 tablet 0   warfarin (COUMADIN) 5 MG tablet Take 1-2 tablets Daily or as prescribed by [facility]. (Patient taking differently: Take 5-7.5 mg by mouth See admin instructions. Take 1.5 tablets (7.5 mg) by mouth on Wednesday in the evening & take 1 tablet (5 mg) by mouth in the evening on all other days.) 90 tablet 0    No results found for this or any previous visit (from the past 48 hour(s)). No results found.  Review of Systems  All other systems reviewed and are negative.   Height '6\' 3"'$  (1.905 m), weight 105.2 kg. Physical Exam  Patient is alert, oriented,  no adenopathy, well-dressed, normal affect, normal respiratory effort. Examination the wound bed has healthy granulation tissue there is superficial epithelialization around the wound edges there is still some swelling.  No cellulitis no drainage no signs of infection. Assessment/Plan 1. Leg wound, left, subsequent encounter       Plan: We will plan for return to the operating room for debridement application of Kerecis tissue graft and a wound VAC outpatient  surgery.  Follow-up in the office 1 week to remove the VAC dressing.  Patient may return to work on Monday.  Newt Minion, MD 04/10/2022, 7:33 AM

## 2022-04-10 NOTE — Anesthesia Procedure Notes (Signed)
Procedure Name: LMA Insertion Date/Time: 04/10/2022 10:24 AM  Performed by: Maude Leriche, CRNAPre-anesthesia Checklist: Patient identified, Emergency Drugs available, Suction available, Patient being monitored and Timeout performed Patient Re-evaluated:Patient Re-evaluated prior to induction Oxygen Delivery Method: Circle system utilized Preoxygenation: Pre-oxygenation with 100% oxygen Induction Type: IV induction Ventilation: Mask ventilation without difficulty LMA: LMA inserted LMA Size: 5.0 Number of attempts: 1 Placement Confirmation: breath sounds checked- equal and bilateral and positive ETCO2 Tube secured with: Tape Dental Injury: Teeth and Oropharynx as per pre-operative assessment

## 2022-04-10 NOTE — Anesthesia Postprocedure Evaluation (Signed)
Anesthesia Post Note  Patient: [name]  Procedure(s) Performed: APPLY SKIN GRAFT TO LEFT LEG WOUND (Left) APPLICATION OF WOUND VAC (Left: Leg Lower)     Patient location during evaluation: PACU Anesthesia Type: General Level of consciousness: awake and alert Pain management: pain level controlled Vital Signs Assessment: post-procedure vital signs reviewed and stable Respiratory status: spontaneous breathing, nonlabored ventilation and respiratory function stable Cardiovascular status: blood pressure returned to baseline and stable Postop Assessment: no apparent nausea or vomiting Anesthetic complications: no   No notable events documented.  Last Vitals:  Vitals:   04/10/22 1110 04/10/22 1125  BP: 133/80 134/78  Pulse: 60 64  Resp: 18 20  Temp:  36.7 C  SpO2: 100% 100%    Last Pain:  Vitals:   04/10/22 1125  TempSrc:   PainSc: 0-No pain                 Lidia Collum

## 2022-04-12 ENCOUNTER — Encounter (HOSPITAL_COMMUNITY): Payer: Self-pay | Admitting: Orthopedic Surgery

## 2022-04-16 ENCOUNTER — Ambulatory Visit (INDEPENDENT_AMBULATORY_CARE_PROVIDER_SITE_OTHER): Payer: Medicare HMO | Admitting: Orthopedic Surgery

## 2022-04-16 DIAGNOSIS — S81802D Unspecified open wound, left lower leg, subsequent encounter: Secondary | ICD-10-CM

## 2022-04-16 MED ORDER — HYDROCODONE-ACETAMINOPHEN 5-325 MG PO TABS: 1.0000 | ORAL_TABLET | Freq: Four times a day (QID) | ORAL | 0 refills | Status: DC | PRN

## 2022-04-19 ENCOUNTER — Encounter: Payer: Self-pay | Admitting: Orthopedic Surgery

## 2022-04-19 NOTE — Progress Notes (Signed)
Office Visit Note   Patient: [name]           Date of Birth: April 03, 1956           MRN: [id number] Visit Date: 04/16/2022              Requested by: [name], MD [address] [place],  [place] [id number] PCP: [name], MD  Chief Complaint  Patient presents with   Left Leg - Routine Post Op    04/10/2022 LLE kerecis graft       HPI: Patient is a 66 year old gentleman status post Kerecis tissue graft left lower extremity the wound VAC is removed.  Assessment & Plan: Visit Diagnoses:  1. Leg wound, left, subsequent encounter     Plan: Start Dial soap cleansing dry dressing changes.  Follow-Up Instructions: Return in about 2 weeks (around 04/30/2022).   Ortho Exam  Patient is alert, oriented, no adenopathy, well-dressed, normal affect, normal respiratory effort. Examination patient has 100% granulation tissue in the wound bed.  Imaging: No results found. No images are attached to the encounter.  Labs: Lab Results  Component Value Date   HGBA1C 5.4 01/29/2022   HGBA1C 5.2 03/16/2019   REPTSTATUS 01/07/2022 FINAL 01/02/2022   REPTSTATUS 01/08/2022 FINAL 01/02/2022   GRAMSTAIN  01/02/2022    FEW WBC PRESENT,BOTH PMN AND MONONUCLEAR RARE GRAM POSITIVE COCCI RARE GRAM NEGATIVE RODS Performed at [facility], [address], [place], [place] [address]    GRAMSTAIN  01/02/2022    FEW WBC PRESENT,BOTH PMN AND MONONUCLEAR NO ORGANISMS SEEN    CULT  01/02/2022    FEW STREPTOCOCCUS GROUP C Beta hemolytic streptococci are predictably susceptible to penicillin and other beta lactams. Susceptibility testing not routinely performed. ABUNDANT KLEBSIELLA OXYTOCA ABUNDANT STENOTROPHOMONAS MALTOPHILIA    CULT  01/02/2022    NO ANAEROBES ISOLATED Performed at [facility], [place] [address] [place], [place] [address]    [place] OXYTOCA 01/02/2022   LABORGA STENOTROPHOMONAS MALTOPHILIA 01/02/2022     Lab Results   Component Value Date   ALBUMIN 2.5 (L) 01/30/2022   ALBUMIN 3.1 (L) 01/29/2022   ALBUMIN 4.1 09/22/2021    Lab Results  Component Value Date   MG 2.1 01/29/2022   MG 2.1 06/19/2019   MG 2.2 03/25/2019   No results found for: "VD25OH"  No results found for: "PREALBUMIN"    Latest Ref Rng & Units 04/10/2022    8:06 AM 03/18/2022   11:08 AM 02/04/2022    8:39 AM  CBC EXTENDED  WBC 4.0 - 10.5 K/uL 6.0  6.5  8.0   RBC 4.22 - 5.81 MIL/uL 4.02  3.80  3.02   Hemoglobin 13.0 - 17.0 g/dL 9.7  9.1 Repeated and verified X2.  8.4 Repeated and verified X2.   HCT 39.0 - 52.0 % 34.4  30.0  25.8 Repeated and verified X2.   Platelets 150 - 400 K/uL 242  315.0  270.0   NEUT# 1.4 - 7.7 K/uL  4.8    Lymph# 0.7 - 4.0 K/uL  0.9       There is no height or weight on file to calculate BMI.  Orders:  No orders of the defined types were placed in this encounter.  Meds ordered this encounter  Medications   HYDROcodone-acetaminophen (NORCO/VICODIN) 5-325 MG tablet    Sig: Take 1-2 tablets by mouth every 6 (six) hours as needed.    Dispense:  30 tablet  Refill:  0     Procedures: No procedures performed  Clinical Data: No additional findings.  ROS:  All other systems negative, except as noted in the HPI. Review of Systems  Objective: Vital Signs: There were no vitals taken for this visit.  Specialty Comments:  No specialty comments available.  PMFS History: Patient Active Problem List   Diagnosis Date Noted   Rectal bleeding 01/30/2022   Abnormal finding on imaging 01/30/2022   GIB (gastrointestinal bleeding) 01/29/2022   Hyponatremia 01/29/2022   Hypocalcemia 01/29/2022   Alcohol abuse 01/29/2022   AKI (acute kidney injury) ([place]) 01/29/2022   Symptomatic anemia 01/29/2022   Venous stasis dermatitis of both lower extremities    Bleeding on Coumadin    Leg wound, left, subsequent encounter 12/30/2021   Compartment syndrome of left lower extremity ([place]) 09/22/2021   Status  post surgery 09/22/2021   Compartment syndrome of left upper extremity ([place]) 09/22/2021   Hyperlipidemia 12/13/2020   Atrial fibrillation ([place]) 06/10/2020   Long term (current) use of anticoagulants 06/10/2020   Secondary hypercoagulable state ([place]) 04/24/2020   AV junctional bradycardia 03/22/2019   S/P minimally invasive mitral valve repair  03/21/2019   S/P Maze operation for atrial fibrillation 03/21/2019   Dilated aortic root ([place]) 01/04/2019   Dilated cardiomyopathy ([place]) 01/04/2019   Atrial flutter with rapid ventricular response ([place])    Mitral valve prolapse    Hypertensive urgency 12/30/2018   Acute on chronic systolic (congestive) heart failure ([place]) 72/07/4708   Acute systolic heart failure ([place]) 62/83/6629   Chronic systolic (congestive) heart failure (H[facility])    Persistent atrial fibrillation ([place]) 12/02/2018   Right hamstring muscle strain 07/20/2018   Essential hypertension 02/07/2016   Severe mitral regurgitation    Annual physical exam 11/16/2014   Arthritis of left knee 10/23/2014   Status post total left knee replacement 10/23/2014   Arthritis of knee, right 01/19/2014   Status post total knee replacement 01/19/2014   Benign paroxysmal positional vertigo 10/30/2013   Testicular hypofunction 03/24/2012   Past Medical History:  Diagnosis Date   Acute on chronic systolic (congestive) heart failure ([place]) 12/30/2018   Allergy    Arthritis    Atrial flutter with rapid ventricular response (H[facility])    Chronic systolic (congestive) heart failure ([place])    COVID 10/2021   mild   Dilated aortic root ([place]) 01/04/2019   Dilated cardiomyopathy ([place]) 01/04/2019   Dysrhythmia    Heart murmur    History of blood transfusion    01/27/22   History of colon polyps    Hypertension    Insomnia    Mitral valve prolapse    Mitral valve regurgitation    Persistent atrial fibrillation ([place]) 12/02/2018   S/P Maze operation for atrial fibrillation 03/21/2019   Complete bilateral  atrial lesion set using cryothermy and bipolar radiofrequency ablation with clipping of LA appendage via right mini thoracotomy approach   S/P minimally invasive mitral valve repair 03/21/2019   Complex valvuloplasty including triangular resection of posterior leaflet, artificial Gore-tex neochord placement x4 and 36 mm Sorin Memo 4D ring annuloplasty via right mini thoracotomy approach   Seizures ([place])    had one approx. 30 yrs ago,has not had any since; due to alchol and no sleep   Severe mitral regurgitation     Family History  Problem Relation Age of Onset   Hypertension Father    Colon cancer Father        dx in his late 51's  Diabetes Mother    Stomach cancer Neg Hx    Esophageal cancer Neg Hx    Pancreatic cancer Neg Hx    Rectal cancer Neg Hx     Past Surgical History:  Procedure Laterality Date   ANKLE FUSION  left   Dec. 8850   APPLICATION OF WOUND VAC Left 09/22/2021   Procedure: APPLICATION OF WOUND VAC;  Surgeon: Mcarthur Rossetti, MD;  Location: [employer] [facility];  Service: Orthopedics;  Laterality: Left;   APPLICATION OF WOUND VAC Left 12/30/2021   Procedure: APPLICATION OF WOUND VAC;  Surgeon: Mcarthur Rossetti, MD;  Location: [place];  Service: Orthopedics;  Laterality: Left;   APPLICATION OF WOUND VAC Left 04/10/2022   Procedure: APPLICATION OF WOUND VAC;  Surgeon: Newt Minion, MD;  Location: [place];  Service: Orthopedics;  Laterality: Left;   ARTHROSCOPY KNEE W/ DRILLING  bilateral   2012   BIOPSY  01/30/2022   Procedure: BIOPSY;  Surgeon: Rush Landmark Telford Nab., MD;  Location: [facility];  Service: Gastroenterology;;   CARDIAC VALVE REPLACEMENT     CARDIOVERSION N/A 01/03/2019   Procedure: CARDIOVERSION;  Surgeon: Sueanne Margarita, MD;  Location: [facility]COPY;  Service: Cardiovascular;  Laterality: N/A;   CARDIOVERSION N/A 06/23/2019   Procedure: CARDIOVERSION;  Surgeon: Fay Records, MD;  Location: [place];  Service: Cardiovascular;  Laterality: N/A;    CLIPPING OF ATRIAL APPENDAGE  03/21/2019   Procedure: Clipping Of Atrial Appendage using 103m Atricure Pro2 Clip;  Surgeon: ORexene Alberts MD;  [place] [facility]  Service: Open Heart Surgery;;   COLONOSCOPY     x2   COLONOSCOPY N/A 01/30/2022   Procedure: COLONOSCOPY;  Surgeon: MIrving Copas, MD;  [facility];  Service: Gastroenterology;  Laterality: N/A;   ESOPHAGOGASTRODUODENOSCOPY N/A 01/30/2022   Procedure: ESOPHAGOGASTRODUODENOSCOPY (EGD);  Surgeon: MIrving Copas, MD;  [facility];  Service: Gastroenterology;  Laterality: N/A;   FOOT ARTHRODESIS  06/23/2012   Procedure: ARTHRODESIS FOOT;  Surgeon: JWylene Simmer MD;  [facility] [facility]  Service: Orthopedics;  Laterality: Left;  Left Subtalar and Talonavicular Joint Revision Arthrodesis  Aspiration of Bone Marrow from Left Hip    HAIR TRANSPLANT     HARDWARE REMOVAL  06/23/2012   Procedure: HARDWARE REMOVAL;  Surgeon: JWylene Simmer MD;  [facility] [facility]  Service: Orthopedics;  Laterality: Left;  Removal of Deep Implant  X's 3   I & D EXTREMITY Left 09/23/2021   Procedure: IRRIGATION AND DEBRIDEMENT OF LEG;  Surgeon: BMcarthur Rossetti MD;  [facility]  Service: Orthopedics;  Laterality: Left;   I & D EXTREMITY Left 09/26/2021   Procedure: REPEAT IRRIGATION AND DEBRIDEMENT LEFT LEG, POSSIBLE WOUND CLOSURE, POSSIBLE VAC CHANGE, POSSIBLE SKIN GRAFT;  Surgeon: BMcarthur Rossetti MD;  [place] [facility]  Service: Orthopedics;  Laterality: Left;   I & D EXTREMITY Left 12/30/2021   Procedure: IRRIGATION AND DEBRIDEMENT LEFT LOWER LEG WOUND;  Surgeon: BMcarthur Rossetti MD;  [place] [facility]  Service: Orthopedics;  Laterality: Left;   I & D EXTREMITY Left 01/02/2022   Procedure: LEFT LEG DEBRIDEMENT AND TISSUE GRAFT;  Surgeon: DNewt Minion MD;  [place] [facility]  Service: Orthopedics;  Laterality: Left;   INCISION AND DRAINAGE WOUND WITH FASCIOTOMY Left 09/22/2021   Procedure: INCISION AND  DRAINAGE WOUND WITH FASCIOTOMY;  Surgeon: BMcarthur Rossetti MD;  [place] [facility]  Service: Orthopedics;  Laterality: Left;   INSERTION OF MESH N/A 07/10/2016   Procedure: INSERTION  OF MESH;  Surgeon: Coralie Keens, MD;  Location: [place];  Service: General;  Laterality: N/A;   JOINT REPLACEMENT     right hip  01-2011   LEFT HEART CATH AND CORONARY ANGIOGRAPHY N/A 03/16/2019   Procedure: LEFT HEART CATH AND CORONARY ANGIOGRAPHY;  Surgeon: Sherren Mocha, MD;  Location: [facility];  Service: Cardiovascular;  Laterality: N/A;   LIMB SPARING RESECTION HIP W/ SADDLE JOINT REPLACEMENT Right    MINIMALLY INVASIVE MAZE PROCEDURE N/A 03/21/2019   Procedure: MINIMALLY INVASIVE MAZE PROCEDURE;  Surgeon: Rexene Alberts, MD;  Location: [place];  Service: Open Heart Surgery;  Laterality: N/A;   MITRAL VALVE REPAIR Right 03/21/2019   Procedure: MINIMALLY INVASIVE MITRAL VALVE REPAIR (MVR) using Memo 4D ring size 36;  Surgeon: Rexene Alberts, MD;  Location: [place];  Service: Open Heart Surgery;  Laterality: Right;   POLYPECTOMY  01/30/2022   Procedure: POLYPECTOMY;  Surgeon: Mansouraty, Telford Nab., MD;  Location: [facility];  Service: Gastroenterology;;   SKIN SPLIT GRAFT Left 04/10/2022   Procedure: APPLY SKIN GRAFT TO LEFT LEG WOUND;  Surgeon: Newt Minion, MD;  Location: [place];  Service: Orthopedics;  Laterality: Left;   TEE WITHOUT CARDIOVERSION N/A 01/03/2019   Procedure: TRANSESOPHAGEAL ECHOCARDIOGRAM (TEE);  Surgeon: Sueanne Margarita, MD;  Location: [facility];  Service: Cardiovascular;  Laterality: N/A;   TEE WITHOUT CARDIOVERSION N/A 03/21/2019   Procedure: TRANSESOPHAGEAL ECHOCARDIOGRAM (TEE);  Surgeon: Rexene Alberts, MD;  Location: [place];  Service: Open Heart Surgery;  Laterality: N/A;   TEMPORARY PACEMAKER N/A 03/22/2019   Procedure: TEMPORARY PACEMAKER;  Surgeon: Belva Crome, MD;  Location: [place] [facility];  Service: Cardiovascular;  Laterality: N/A;    TOTAL KNEE ARTHROPLASTY Right 01/19/2014   Procedure: RIGHT TOTAL KNEE ARTHROPLASTY, Steroid injection left knee;  Surgeon: Mcarthur Rossetti, MD;  Location: [facility];  Service: Orthopedics;  Laterality: Right;   TOTAL KNEE ARTHROPLASTY Left 10/23/2014   Procedure: LEFT TOTAL KNEE ARTHROPLASTY;  Surgeon: Mcarthur Rossetti, MD;  [facility]: [facility];  Service: Orthopedics;  Laterality: Left;   UMBILICAL HERNIA REPAIR N/A 07/10/2016   Procedure: UMBILICAL HERNIA REPAIR;  Surgeon: Coralie Keens, MD;  Location: [place];  Service: General;  Laterality: N/A;   Social History   Occupational History   Occupation: Pension scheme manager  Tobacco Use   Smoking status: Never   Smokeless tobacco: Never  Vaping Use   Vaping Use: Never used  Substance and Sexual Activity   Alcohol use: Not Currently    Alcohol/week: 21.0 standard drinks of alcohol    Types: 21 Shots of liquor per week   Drug use: No   Sexual activity: Yes

## 2022-04-21 ENCOUNTER — Other Ambulatory Visit: Payer: Self-pay | Admitting: Orthopedic Surgery

## 2022-04-22 ENCOUNTER — Encounter: Payer: Self-pay | Admitting: Gastroenterology

## 2022-04-22 MED ORDER — HYDROCODONE-ACETAMINOPHEN 5-325 MG PO TABS: 1.0000 | ORAL_TABLET | Freq: Four times a day (QID) | ORAL | 0 refills | Status: DC | PRN

## 2022-04-24 ENCOUNTER — Ambulatory Visit: Payer: Medicare HMO | Admitting: Cardiovascular Disease

## 2022-04-27 ENCOUNTER — Other Ambulatory Visit: Payer: Self-pay | Admitting: Cardiovascular Disease

## 2022-04-30 ENCOUNTER — Ambulatory Visit (INDEPENDENT_AMBULATORY_CARE_PROVIDER_SITE_OTHER): Payer: Medicare HMO | Admitting: Orthopedic Surgery

## 2022-04-30 DIAGNOSIS — S81802D Unspecified open wound, left lower leg, subsequent encounter: Secondary | ICD-10-CM | POA: Diagnosis not present

## 2022-05-01 ENCOUNTER — Other Ambulatory Visit: Payer: Self-pay | Admitting: Family

## 2022-05-01 ENCOUNTER — Encounter: Payer: Self-pay | Admitting: Orthopedic Surgery

## 2022-05-01 MED ORDER — HYDROCODONE-ACETAMINOPHEN 5-325 MG PO TABS: 1.0000 | ORAL_TABLET | Freq: Four times a day (QID) | ORAL | 0 refills | Status: DC | PRN

## 2022-05-01 NOTE — Progress Notes (Signed)
Office Visit Note   Patient: [name]           Date of Birth: 06/03/1956           MRN: [id number] Visit Date: 04/30/2022              Requested by: [name], MD [place],  [place] [id number] PCP: [name], MD  Chief Complaint  Patient presents with   Left Leg - Routine Post Op    04/10/2022 LLE kerecis graft       HPI: Patient is a 66 year old gentleman who is seen 3 weeks status post application of Kerecis left lower extremity.  Assessment & Plan: Visit Diagnoses:  1. Leg wound, left, subsequent encounter     Plan: Continue with wound care and compression.  Follow-Up Instructions: Return in about 3 weeks (around 05/21/2022).   Ortho Exam  Patient is alert, oriented, no adenopathy, well-dressed, normal affect, normal respiratory effort. Examination there is improved epithelialization healthy granulation tissue there is still some swelling.  There are a few islands of epithelialization.  Imaging: No results found.     Labs: Lab Results  Component Value Date   HGBA1C 5.4 01/29/2022   HGBA1C 5.2 03/16/2019   REPTSTATUS 01/07/2022 FINAL 01/02/2022   REPTSTATUS 01/08/2022 FINAL 01/02/2022   GRAMSTAIN  01/02/2022    FEW WBC PRESENT,BOTH PMN AND MONONUCLEAR RARE GRAM POSITIVE COCCI RARE GRAM NEGATIVE RODS Performed at [facility], [address], [place], [place] [address]    GRAMSTAIN  01/02/2022    FEW WBC PRESENT,BOTH PMN AND MONONUCLEAR NO ORGANISMS SEEN    CULT  01/02/2022    FEW STREPTOCOCCUS GROUP C Beta hemolytic streptococci are predictably susceptible to penicillin and other beta lactams. Susceptibility testing not routinely performed. ABUNDANT KLEBSIELLA OXYTOCA ABUNDANT STENOTROPHOMONAS MALTOPHILIA    CULT  01/02/2022    NO ANAEROBES ISOLATED Performed at [facility], [place] [address] [place], [place] [address]    [place] OXYTOCA 01/02/2022   LABORGA STENOTROPHOMONAS  MALTOPHILIA 01/02/2022     Lab Results  Component Value Date   ALBUMIN 2.5 (L) 01/30/2022   ALBUMIN 3.1 (L) 01/29/2022   ALBUMIN 4.1 09/22/2021    Lab Results  Component Value Date   MG 2.1 01/29/2022   MG 2.1 06/19/2019   MG 2.2 03/25/2019   No results found for: "VD25OH"  No results found for: "PREALBUMIN"    Latest Ref Rng & Units 04/10/2022    8:06 AM 03/18/2022   11:08 AM 02/04/2022    8:39 AM  CBC EXTENDED  WBC 4.0 - 10.5 K/uL 6.0  6.5  8.0   RBC 4.22 - 5.81 MIL/uL 4.02  3.80  3.02   Hemoglobin 13.0 - 17.0 g/dL 9.7  9.1 Repeated and verified X2.  8.4 Repeated and verified X2.   HCT 39.0 - 52.0 % 34.4  30.0  25.8 Repeated and verified X2.   Platelets 150 - 400 K/uL 242  315.0  270.0   NEUT# 1.4 - 7.7 K/uL  4.8    Lymph# 0.7 - 4.0 K/uL  0.9       There is no height or weight on file to calculate BMI.  Orders:  No orders of the defined types were placed in this encounter.  No orders of the defined types were placed in this encounter.    Procedures: No procedures performed  Clinical Data: No additional findings.  ROS:  All other systems  negative, except as noted in the HPI. Review of Systems  Objective: Vital Signs: There were no vitals taken for this visit.  Specialty Comments:  No specialty comments available.  PMFS History: Patient Active Problem List   Diagnosis Date Noted   Rectal bleeding 01/30/2022   Abnormal finding on imaging 01/30/2022   GIB (gastrointestinal bleeding) 01/29/2022   Hyponatremia 01/29/2022   Hypocalcemia 01/29/2022   Alcohol abuse 01/29/2022   AKI (acute kidney injury) ([place]) 01/29/2022   Symptomatic anemia 01/29/2022   Venous stasis dermatitis of both lower extremities    Bleeding on Coumadin    Leg wound, left, subsequent encounter 12/30/2021   Compartment syndrome of left lower extremity ([place]) 09/22/2021   Status post surgery 09/22/2021   Compartment syndrome of left upper extremity ([place]) 09/22/2021    Hyperlipidemia 12/13/2020   Atrial fibrillation ([place]) 06/10/2020   Long term (current) use of anticoagulants 06/10/2020   Secondary hypercoagulable state ([place]) 04/24/2020   AV junctional bradycardia 03/22/2019   S/P minimally invasive mitral valve repair  03/21/2019   S/P Maze operation for atrial fibrillation 03/21/2019   Dilated aortic root ([place]) 01/04/2019   Dilated cardiomyopathy ([place]) 01/04/2019   Atrial flutter with rapid ventricular response ([place])    Mitral valve prolapse    Hypertensive urgency 12/30/2018   Acute on chronic systolic (congestive) heart failure ([place]) 82/50/5397   Acute systolic heart failure ([place]) 67/34/1937   Chronic systolic (congestive) heart failure (HCC)    Persistent atrial fibrillation ([place]) 12/02/2018   Right hamstring muscle strain 07/20/2018   Essential hypertension 02/07/2016   Severe mitral regurgitation    Annual physical exam 11/16/2014   Arthritis of left knee 10/23/2014   Status post total left knee replacement 10/23/2014   Arthritis of knee, right 01/19/2014   Status post total knee replacement 01/19/2014   Benign paroxysmal positional vertigo 10/30/2013   Testicular hypofunction 03/24/2012   Past Medical History:  Diagnosis Date   Acute on chronic systolic (congestive) heart failure ([place]) 12/30/2018   Allergy    Arthritis    Atrial flutter with rapid ventricular response (HCC)    Chronic systolic (congestive) heart failure ([place])    COVID 10/2021   mild   Dilated aortic root ([place]) 01/04/2019   Dilated cardiomyopathy ([place]) 01/04/2019   Dysrhythmia    Heart murmur    History of blood transfusion    01/27/22   History of colon polyps    Hypertension    Insomnia    Mitral valve prolapse    Mitral valve regurgitation    Persistent atrial fibrillation ([place]) 12/02/2018   S/P Maze operation for atrial fibrillation 03/21/2019   Complete bilateral atrial lesion set using cryothermy and bipolar radiofrequency ablation with clipping of LA  appendage via right mini thoracotomy approach   S/P minimally invasive mitral valve repair 03/21/2019   Complex valvuloplasty including triangular resection of posterior leaflet, artificial Gore-tex neochord placement x4 and 36 mm Sorin Memo 4D ring annuloplasty via right mini thoracotomy approach   Seizures ([place])    had one approx. 30 yrs ago,has not had any since; due to alchol and no sleep   Severe mitral regurgitation     Family History  Problem Relation Age of Onset   Hypertension Father    Colon cancer Father        dx in his late 4's   Diabetes Mother    Stomach cancer Neg Hx    Esophageal cancer Neg Hx    Pancreatic cancer Neg Hx  Rectal cancer Neg Hx     Past Surgical History:  Procedure Laterality Date   ANKLE FUSION  left   Dec. 4098   APPLICATION OF WOUND VAC Left 09/22/2021   Procedure: APPLICATION OF WOUND VAC;  Surgeon: Mcarthur Rossetti, MD;  Location: [facility];  Service: Orthopedics;  Laterality: Left;   APPLICATION OF WOUND VAC Left 12/30/2021   Procedure: APPLICATION OF WOUND VAC;  Surgeon: Mcarthur Rossetti, MD;  Location: [place];  Service: Orthopedics;  Laterality: Left;   APPLICATION OF WOUND VAC Left 04/10/2022   Procedure: APPLICATION OF WOUND VAC;  Surgeon: Newt Minion, MD;  Location: [place] [place];  Service: Orthopedics;  Laterality: Left;   ARTHROSCOPY KNEE W/ DRILLING  bilateral   2012   BIOPSY  01/30/2022   Procedure: BIOPSY;  Surgeon: Rush Landmark Telford Nab., MD;  Location: [facility];  Service: Gastroenterology;;   CARDIAC VALVE REPLACEMENT     CARDIOVERSION N/A 01/03/2019   Procedure: CARDIOVERSION;  Surgeon: Sueanne Margarita, MD;  Location: [facility];  Service: Cardiovascular;  Laterality: N/A;   CARDIOVERSION N/A 06/23/2019   Procedure: CARDIOVERSION;  Surgeon: Fay Records, MD;  Location: [facility];  Service: Cardiovascular;  Laterality: N/A;   CLIPPING OF ATRIAL APPENDAGE  03/21/2019   Procedure: Clipping Of Atrial Appendage using 82m  Atricure Pro2 Clip;  Surgeon: ORexene Alberts MD;  [facility]  Service: Open Heart Surgery;;   COLONOSCOPY     x2   COLONOSCOPY N/A 01/30/2022   Procedure: COLONOSCOPY;  Surgeon: MIrving Copas, MD;  [facility];  Service: Gastroenterology;  Laterality: N/A;   ESOPHAGOGASTRODUODENOSCOPY N/A 01/30/2022   Procedure: ESOPHAGOGASTRODUODENOSCOPY (EGD);  Surgeon: MIrving Copas, MD;  [facility];  Service: Gastroenterology;  Laterality: N/A;   FOOT ARTHRODESIS  06/23/2012   Procedure: ARTHRODESIS FOOT;  Surgeon: JWylene Simmer MD;  [facility]  Service: Orthopedics;  Laterality: Left;  Left Subtalar and Talonavicular Joint Revision Arthrodesis  Aspiration of Bone Marrow from Left Hip    HAIR TRANSPLANT     HARDWARE REMOVAL  06/23/2012   Procedure: HARDWARE REMOVAL;  Surgeon: JWylene Simmer MD;  [facility] [facility]  Service: Orthopedics;  Laterality: Left;  Removal of Deep Implant  X's 3   I & D EXTREMITY Left 09/23/2021   Procedure: IRRIGATION AND DEBRIDEMENT OF LEG;  Surgeon: BMcarthur Rossetti MD;  [place] [facility]  Service: Orthopedics;  Laterality: Left;   I & D EXTREMITY Left 09/26/2021   Procedure: REPEAT IRRIGATION AND DEBRIDEMENT LEFT LEG, POSSIBLE WOUND CLOSURE, POSSIBLE VAC CHANGE, POSSIBLE SKIN GRAFT;  Surgeon: BMcarthur Rossetti MD;  [place] [facility]  Service: Orthopedics;  Laterality: Left;   I & D EXTREMITY Left 12/30/2021   Procedure: IRRIGATION AND DEBRIDEMENT LEFT LOWER LEG WOUND;  Surgeon: BMcarthur Rossetti MD;  [place] [facility]  Service: Orthopedics;  Laterality: Left;   I & D EXTREMITY Left 01/02/2022   Procedure: LEFT LEG DEBRIDEMENT AND TISSUE GRAFT;  Surgeon: DNewt Minion MD;  [place] [facility]  Service: Orthopedics;  Laterality: Left;   INCISION AND DRAINAGE WOUND WITH FASCIOTOMY Left 09/22/2021   Procedure: INCISION AND DRAINAGE WOUND WITH FASCIOTOMY;  Surgeon: BMcarthur Rossetti MD;  [facility] [facility]  Service:  Orthopedics;  Laterality: Left;   INSERTION OF MESH N/A 07/10/2016   Procedure: INSERTION OF MESH;  Surgeon: DCoralie Keens MD;  [place] [facility]  Service: General;  Laterality: N/A;   JOINT REPLACEMENT  right hip  01-2011   LEFT HEART CATH AND CORONARY ANGIOGRAPHY N/A 03/16/2019   Procedure: LEFT HEART CATH AND CORONARY ANGIOGRAPHY;  Surgeon: Sherren Mocha, MD;  Location: [facility];  Service: Cardiovascular;  Laterality: N/A;   LIMB SPARING RESECTION HIP W/ SADDLE JOINT REPLACEMENT Right    MINIMALLY INVASIVE MAZE PROCEDURE N/A 03/21/2019   Procedure: MINIMALLY INVASIVE MAZE PROCEDURE;  Surgeon: Rexene Alberts, MD;  Location: [place];  Service: Open Heart Surgery;  Laterality: N/A;   MITRAL VALVE REPAIR Right 03/21/2019   Procedure: MINIMALLY INVASIVE MITRAL VALVE REPAIR (MVR) using Memo 4D ring size 36;  Surgeon: Rexene Alberts, MD;  Location: [facility] [place];  Service: Open Heart Surgery;  Laterality: Right;   POLYPECTOMY  01/30/2022   Procedure: POLYPECTOMY;  Surgeon: Mansouraty, Telford Nab., MD;  Location[facility];  Service: Gastroenterology;;   SKIN SPLIT GRAFT Left 04/10/2022   Procedure: APPLY SKIN GRAFT TO LEFT LEG WOUND;  Surgeon: Newt Minion, MD;  Location: [place];  Service: Orthopedics;  Laterality: Left;   TEE WITHOUT CARDIOVERSION N/A 01/03/2019   Procedure: TRANSESOPHAGEAL ECHOCARDIOGRAM (TEE);  Surgeon: Sueanne Margarita, MD;  Location: [facility];  Service: Cardiovascular;  Laterality: N/A;   TEE WITHOUT CARDIOVERSION N/A 03/21/2019   Procedure: TRANSESOPHAGEAL ECHOCARDIOGRAM (TEE);  Surgeon: Rexene Alberts, MD;  Location: [facility];  Service: Open Heart Surgery;  Laterality: N/A;   TEMPORARY PACEMAKER N/A 03/22/2019   Procedure: TEMPORARY PACEMAKER;  Surgeon: Belva Crome, MD;  Location: [facility];  Service: Cardiovascular;  Laterality: N/A;   TOTAL KNEE ARTHROPLASTY Right 01/19/2014   Procedure: RIGHT TOTAL KNEE ARTHROPLASTY, Steroid injection  left knee;  Surgeon: Mcarthur Rossetti, MD;  [facility];  Service: Orthopedics;  Laterality: Right;   TOTAL KNEE ARTHROPLASTY Left 10/23/2014   Procedure: LEFT TOTAL KNEE ARTHROPLASTY;  Surgeon: Mcarthur Rossetti, MD;  [facility];  Service: Orthopedics;  Laterality: Left;   UMBILICAL HERNIA REPAIR N/A 07/10/2016   Procedure: UMBILICAL HERNIA REPAIR;  Surgeon: Coralie Keens, MD;  Location: [facility];  Service: General;  Laterality: N/A;   Social History   Occupational History   Occupation: Pension scheme manager  Tobacco Use   Smoking status: Never   Smokeless tobacco: Never  Vaping Use   Vaping Use: Never used  Substance and Sexual Activity   Alcohol use: Not Currently    Alcohol/week: 21.0 standard drinks of alcohol    Types: 21 Shots of liquor per week   Drug use: No   Sexual activity: Yes

## 2022-05-10 ENCOUNTER — Other Ambulatory Visit: Payer: Self-pay | Admitting: Cardiovascular Disease

## 2022-05-10 DIAGNOSIS — I4819 Other persistent atrial fibrillation: Secondary | ICD-10-CM

## 2022-05-11 NOTE — Telephone Encounter (Signed)
Prescription refill request received for warfarin Lov: 12/13/20 Gwenlyn Found)  Next INR check: 05/21/22 Warfarin tablet strength: '5mg'$    Overdue for office visit. Pt has schedule appt on 07/14/22 with Dr Gwenlyn Found. Appropriate dose and refill sent to requested pharmacy.

## 2022-05-13 ENCOUNTER — Other Ambulatory Visit: Payer: Self-pay | Admitting: Family

## 2022-05-13 MED ORDER — HYDROCODONE-ACETAMINOPHEN 5-325 MG PO TABS: 1.0000 | ORAL_TABLET | Freq: Four times a day (QID) | ORAL | 0 refills | Status: DC | PRN

## 2022-05-21 ENCOUNTER — Ambulatory Visit (INDEPENDENT_AMBULATORY_CARE_PROVIDER_SITE_OTHER): Payer: Medicare HMO

## 2022-05-21 DIAGNOSIS — I4891 Unspecified atrial fibrillation: Secondary | ICD-10-CM | POA: Diagnosis not present

## 2022-05-21 DIAGNOSIS — Z5181 Encounter for therapeutic drug level monitoring: Secondary | ICD-10-CM | POA: Diagnosis not present

## 2022-05-21 DIAGNOSIS — Z7901 Long term (current) use of anticoagulants: Secondary | ICD-10-CM | POA: Diagnosis not present

## 2022-05-21 DIAGNOSIS — I4892 Unspecified atrial flutter: Secondary | ICD-10-CM | POA: Diagnosis not present

## 2022-05-21 DIAGNOSIS — I4819 Other persistent atrial fibrillation: Secondary | ICD-10-CM | POA: Diagnosis not present

## 2022-05-21 LAB — POCT INR: INR: 2.6 (ref 2.0–3.0)

## 2022-05-21 NOTE — Patient Instructions (Signed)
Description   Continue taking 1 tablet daily except 1.5 tablets on Wednesdays.  [phone]. INR 8 weeks

## 2022-05-22 ENCOUNTER — Other Ambulatory Visit: Payer: Self-pay | Admitting: Family

## 2022-05-22 MED ORDER — HYDROCODONE-ACETAMINOPHEN 5-325 MG PO TABS
1.0000 | ORAL_TABLET | Freq: Three times a day (TID) | ORAL | 0 refills | Status: DC | PRN
Start: 2022-05-22 — End: 2022-06-09

## 2022-05-25 ENCOUNTER — Encounter: Payer: BLUE CROSS/BLUE SHIELD | Admitting: Orthopedic Surgery

## 2022-05-28 ENCOUNTER — Other Ambulatory Visit: Payer: Self-pay | Admitting: Cardiovascular Disease

## 2022-05-29 ENCOUNTER — Encounter: Payer: Self-pay | Admitting: Gastroenterology

## 2022-06-02 ENCOUNTER — Encounter: Payer: BLUE CROSS/BLUE SHIELD | Admitting: Family

## 2022-06-03 ENCOUNTER — Encounter: Payer: Self-pay | Admitting: Family

## 2022-06-03 ENCOUNTER — Ambulatory Visit (INDEPENDENT_AMBULATORY_CARE_PROVIDER_SITE_OTHER): Payer: Medicare HMO | Admitting: Family

## 2022-06-03 DIAGNOSIS — T79A22D Traumatic compartment syndrome of left lower extremity, subsequent encounter: Secondary | ICD-10-CM | POA: Diagnosis not present

## 2022-06-03 NOTE — Progress Notes (Addendum)
Office Visit Note   Patient: [name]           Date of Birth: November 14, 1955           MRN: [id number] Visit Date: 06/03/2022              Requested by: [name], MD [place],  [place] [id number] PCP: [name], MD  Chief Complaint  Patient presents with   Left Leg - Routine Post Op    04/10/2022 LLE kerecis graft       HPI: Patient is a 66 year old gentleman who is 7 weeks status post application of Kerecis left lower extremity.  He is pleased with the improvement in his wound.  He continues with daily Dial soap cleansing ABD pads Ace wrap and then a medical compression stocking over this.  He has been using some Benadryl cream for surrounding itching.  Assessment & Plan: Visit Diagnoses:  No diagnosis found.   Plan: Continue with wound care and compression. Discussed using hydrocortisone as well as moisturizing creams for the lower extremity dry skin and itching.  Follow-Up Instructions: Return in about 2 weeks (around 06/17/2022).   Ortho Exam  Patient is alert, oriented, no adenopathy, well-dressed, normal affect, normal respiratory effort. Examination there is improved epithelialization healthy granulation tissue.  Minimal edema.  There is still some swelling. Improved in size, is now 17 cm x 15 cm. There is near full circumferential epithelialization.  No active drainage.  No maceration.  There is dermatitis and dry flaking skin abrasions from scratching  Imaging: No results found.     Labs: Lab Results  Component Value Date   HGBA1C 5.4 01/29/2022   HGBA1C 5.2 03/16/2019   REPTSTATUS 01/07/2022 FINAL 01/02/2022   REPTSTATUS 01/08/2022 FINAL 01/02/2022   GRAMSTAIN  01/02/2022    FEW WBC PRESENT,BOTH PMN AND MONONUCLEAR RARE GRAM POSITIVE COCCI RARE GRAM NEGATIVE RODS Performed at [facility], [address] [place], [place] [address]    GRAMSTAIN  01/02/2022    FEW WBC PRESENT,BOTH PMN AND MONONUCLEAR NO  ORGANISMS SEEN    CULT  01/02/2022    FEW STREPTOCOCCUS GROUP C Beta hemolytic streptococci are predictably susceptible to penicillin and other beta lactams. Susceptibility testing not routinely performed. ABUNDANT KLEBSIELLA OXYTOCA ABUNDANT STENOTROPHOMONAS MALTOPHILIA    CULT  01/02/2022    NO ANAEROBES ISOLATED Performed at [facility], [place] [address], [place], [place] [address]    [place] OXYTOCA 01/02/2022   LABORGA STENOTROPHOMONAS MALTOPHILIA 01/02/2022     Lab Results  Component Value Date   ALBUMIN 2.5 (L) 01/30/2022   ALBUMIN 3.1 (L) 01/29/2022   ALBUMIN 4.1 09/22/2021    Lab Results  Component Value Date   MG 2.1 01/29/2022   MG 2.1 06/19/2019   MG 2.2 03/25/2019   No results found for: "VD25OH"  No results found for: "PREALBUMIN"    Latest Ref Rng & Units 04/10/2022    8:06 AM 03/18/2022   11:08 AM 02/04/2022    8:39 AM  CBC EXTENDED  WBC 4.0 - 10.5 K/uL 6.0  6.5  8.0   RBC 4.22 - 5.81 MIL/uL 4.02  3.80  3.02   Hemoglobin 13.0 - 17.0 g/dL 9.7  9.1 Repeated and verified X2.  8.4 Repeated and verified X2.   HCT 39.0 - 52.0 % 34.4  30.0  25.8 Repeated and verified X2.   Platelets 150 - 400 K/uL 242  315.0  270.0  NEUT# 1.4 - 7.7 K/uL  4.8    Lymph# 0.7 - 4.0 K/uL  0.9       There is no height or weight on file to calculate BMI.  Orders:  No orders of the defined types were placed in this encounter.  No orders of the defined types were placed in this encounter.    Procedures: No procedures performed  Clinical Data: No additional findings.  ROS:  All other systems negative, except as noted in the HPI. Review of Systems  Objective: Vital Signs: There were no vitals taken for this visit.  Specialty Comments:  No specialty comments available.  PMFS History: Patient Active Problem List   Diagnosis Date Noted   Rectal bleeding 01/30/2022   Abnormal finding on imaging 01/30/2022   GIB (gastrointestinal bleeding) 01/29/2022    Hyponatremia 01/29/2022   Hypocalcemia 01/29/2022   Alcohol abuse 01/29/2022   AKI (acute kidney injury) ([place]) 01/29/2022   Symptomatic anemia 01/29/2022   Venous stasis dermatitis of both lower extremities    Bleeding on Coumadin    Leg wound, left, subsequent encounter 12/30/2021   Compartment syndrome of left lower extremity ([place]) 09/22/2021   Status post surgery 09/22/2021   Compartment syndrome of left upper extremity ([place]) 09/22/2021   Hyperlipidemia 12/13/2020   Atrial fibrillation ([place]) 06/10/2020   Long term (current) use of anticoagulants 06/10/2020   Secondary hypercoagulable state ([place]) 04/24/2020   AV junctional bradycardia 03/22/2019   S/P minimally invasive mitral valve repair  03/21/2019   S/P Maze operation for atrial fibrillation 03/21/2019   Dilated aortic root ([place]) 01/04/2019   Dilated cardiomyopathy ([place]) 01/04/2019   Atrial flutter with rapid ventricular response ([place])    Mitral valve prolapse    Hypertensive urgency 12/30/2018   Acute on chronic systolic (congestive) heart failure ([place]) 62/95/2841   Acute systolic heart failure ([place]) 32/44/0102   Chronic systolic (congestive) heart failure (H[facility])    Persistent atrial fibrillation ([place]) 12/02/2018   Right hamstring muscle strain 07/20/2018   Essential hypertension 02/07/2016   Severe mitral regurgitation    Annual physical exam 11/16/2014   Arthritis of left knee 10/23/2014   Status post total left knee replacement 10/23/2014   Arthritis of knee, right 01/19/2014   Status post total knee replacement 01/19/2014   Benign paroxysmal positional vertigo 10/30/2013   Testicular hypofunction 03/24/2012   Past Medical History:  Diagnosis Date   Acute on chronic systolic (congestive) heart failure ([place]) 12/30/2018   Allergy    Arthritis    Atrial flutter with rapid ventricular response (HCC)    Chronic systolic (congestive) heart failure ([place])    COVID 10/2021   mild   Dilated aortic root ([place])  01/04/2019   Dilated cardiomyopathy ([place]) 01/04/2019   Dysrhythmia    Heart murmur    History of blood transfusion    01/27/22   History of colon polyps    Hypertension    Insomnia    Mitral valve prolapse    Mitral valve regurgitation    Persistent atrial fibrillation ([place]) 12/02/2018   S/P Maze operation for atrial fibrillation 03/21/2019   Complete bilateral atrial lesion set using cryothermy and bipolar radiofrequency ablation with clipping of LA appendage via right mini thoracotomy approach   S/P minimally invasive mitral valve repair 03/21/2019   Complex valvuloplasty including triangular resection of posterior leaflet, artificial Gore-tex neochord placement x4 and 36 mm Sorin Memo 4D ring annuloplasty via right mini thoracotomy approach   Seizures ([place])    had  one approx. 30 yrs ago,has not had any since; due to alchol and no sleep   Severe mitral regurgitation     Family History  Problem Relation Age of Onset   Hypertension Father    Colon cancer Father        dx in his late 52's   Diabetes Mother    Stomach cancer Neg Hx    Esophageal cancer Neg Hx    Pancreatic cancer Neg Hx    Rectal cancer Neg Hx     Past Surgical History:  Procedure Laterality Date   ANKLE FUSION  left   Dec. 1610   APPLICATION OF WOUND VAC Left 09/22/2021   Procedure: APPLICATION OF WOUND VAC;  Surgeon: Mcarthur Rossetti, MD;  Location: [facility];  Service: Orthopedics;  Laterality: Left;   APPLICATION OF WOUND VAC Left 12/30/2021   Procedure: APPLICATION OF WOUND VAC;  Surgeon: Mcarthur Rossetti, MD;  Location: [facility];  Service: Orthopedics;  Laterality: Left;   APPLICATION OF WOUND VAC Left 04/10/2022   Procedure: APPLICATION OF WOUND VAC;  Surgeon: Newt Minion, MD;  Location: [facility];  Service: Orthopedics;  Laterality: Left;   ARTHROSCOPY KNEE W/ DRILLING  bilateral   2012   BIOPSY  01/30/2022   Procedure: BIOPSY;  Surgeon: Rush Landmark Telford Nab., MD;  Location: [facility];   Service: Gastroenterology;;   CARDIAC VALVE REPLACEMENT     CARDIOVERSION N/A 01/03/2019   Procedure: CARDIOVERSION;  Surgeon: Sueanne Margarita, MD;  [facility];  Service: Cardiovascular;  Laterality: N/A;   CARDIOVERSION N/A 06/23/2019   Procedure: CARDIOVERSION;  Surgeon: Fay Records, MD;  Location: [facility];  Service: Cardiovascular;  Laterality: N/A;   CLIPPING OF ATRIAL APPENDAGE  03/21/2019   Procedure: Clipping Of Atrial Appendage using 26m Atricure Pro2 Clip;  Surgeon: ORexene Alberts MD;  [facility]  Service: Open Heart Surgery;;   COLONOSCOPY     x2   COLONOSCOPY N/A 01/30/2022   Procedure: COLONOSCOPY;  Surgeon: MIrving Copas, MD;  [facility];  Service: Gastroenterology;  Laterality: N/A;   ESOPHAGOGASTRODUODENOSCOPY N/A 01/30/2022   Procedure: ESOPHAGOGASTRODUODENOSCOPY (EGD);  Surgeon: MIrving Copas, MD;  [facility];  Service: Gastroenterology;  Laterality: N/A;   FOOT ARTHRODESIS  06/23/2012   Procedure: ARTHRODESIS FOOT;  Surgeon: JWylene Simmer MD;  [place] [facility]  Service: Orthopedics;  Laterality: Left;  Left Subtalar and Talonavicular Joint Revision Arthrodesis  Aspiration of Bone Marrow from Left Hip    HAIR TRANSPLANT     HARDWARE REMOVAL  06/23/2012   Procedure: HARDWARE REMOVAL;  Surgeon: JWylene Simmer MD;  [place] [facility]  Service: Orthopedics;  Laterality: Left;  Removal of Deep Implant  X's 3   I & D EXTREMITY Left 09/23/2021   Procedure: IRRIGATION AND DEBRIDEMENT OF LEG;  Surgeon: BMcarthur Rossetti MD;  [place] [facility]  Service: Orthopedics;  Laterality: Left;   I & D EXTREMITY Left 09/26/2021   Procedure: REPEAT IRRIGATION AND DEBRIDEMENT LEFT LEG, POSSIBLE WOUND CLOSURE, POSSIBLE VAC CHANGE, POSSIBLE SKIN GRAFT;  Surgeon: BMcarthur Rossetti MD;  [place] [facility]  Service: Orthopedics;  Laterality: Left;   I & D EXTREMITY Left 12/30/2021   Procedure: IRRIGATION AND DEBRIDEMENT LEFT LOWER  LEG WOUND;  Surgeon: BMcarthur Rossetti MD;  [place] [facility]  Service: Orthopedics;  Laterality: Left;   I & D EXTREMITY Left 01/02/2022   Procedure: LEFT LEG DEBRIDEMENT AND TISSUE GRAFT;  Surgeon: DNewt Minion MD;  Location: [facility];  Service: Orthopedics;  Laterality: Left;   INCISION AND DRAINAGE WOUND WITH FASCIOTOMY Left 09/22/2021   Procedure: INCISION AND DRAINAGE WOUND WITH FASCIOTOMY;  Surgeon: Mcarthur Rossetti, MD;  Location: [facility];  Service: Orthopedics;  Laterality: Left;   INSERTION OF MESH N/A 07/10/2016   Procedure: INSERTION OF MESH;  Surgeon: Coralie Keens, MD;  Location: [facility];  Service: General;  Laterality: N/A;   JOINT REPLACEMENT     right hip  01-2011   LEFT HEART CATH AND CORONARY ANGIOGRAPHY N/A 03/16/2019   Procedure: LEFT HEART CATH AND CORONARY ANGIOGRAPHY;  Surgeon: Sherren Mocha, MD;  Location: [facility];  Service: Cardiovascular;  Laterality: N/A;   LIMB SPARING RESECTION HIP W/ SADDLE JOINT REPLACEMENT Right    MINIMALLY INVASIVE MAZE PROCEDURE N/A 03/21/2019   Procedure: MINIMALLY INVASIVE MAZE PROCEDURE;  Surgeon: Rexene Alberts, MD;  Location: [facility];  Service: Open Heart Surgery;  Laterality: N/A;   MITRAL VALVE REPAIR Right 03/21/2019   Procedure: MINIMALLY INVASIVE MITRAL VALVE REPAIR (MVR) using Memo 4D ring size 36;  Surgeon: Rexene Alberts, MD;  Location: [facility];  Service: Open Heart Surgery;  Laterality: Right;   POLYPECTOMY  01/30/2022   Procedure: POLYPECTOMY;  Surgeon: Mansouraty, Telford Nab., MD;  Location: [facility];  Service: Gastroenterology;;   SKIN SPLIT GRAFT Left 04/10/2022   Procedure: APPLY SKIN GRAFT TO LEFT LEG WOUND;  Surgeon: Newt Minion, MD;  Location: [facility];  Service: Orthopedics;  Laterality: Left;   TEE WITHOUT CARDIOVERSION N/A 01/03/2019   Procedure: TRANSESOPHAGEAL ECHOCARDIOGRAM (TEE);  Surgeon: Sueanne Margarita, MD;  Location: [facility];  Service: Cardiovascular;  Laterality: N/A;    TEE WITHOUT CARDIOVERSION N/A 03/21/2019   Procedure: TRANSESOPHAGEAL ECHOCARDIOGRAM (TEE);  Surgeon: Rexene Alberts, MD;  Location: [facility];  Service: Open Heart Surgery;  Laterality: N/A;   TEMPORARY PACEMAKER N/A 03/22/2019   Procedure: TEMPORARY PACEMAKER;  Surgeon: Belva Crome, MD;  Location: [facility];  Service: Cardiovascular;  Laterality: N/A;   TOTAL KNEE ARTHROPLASTY Right 01/19/2014   Procedure: RIGHT TOTAL KNEE ARTHROPLASTY, Steroid injection left knee;  Surgeon: Mcarthur Rossetti, MD;  [facility];  Service: Orthopedics;  Laterality: Right;   TOTAL KNEE ARTHROPLASTY Left 10/23/2014   Procedure: LEFT TOTAL KNEE ARTHROPLASTY;  Surgeon: Mcarthur Rossetti, MD;  [facility];  Service: Orthopedics;  Laterality: Left;   UMBILICAL HERNIA REPAIR N/A 07/10/2016   Procedure: UMBILICAL HERNIA REPAIR;  Surgeon: Coralie Keens, MD;  Location: [facility];  Service: General;  Laterality: N/A;   Social History   Occupational History   Occupation: Pension scheme manager  Tobacco Use   Smoking status: Never   Smokeless tobacco: Never  Vaping Use   Vaping Use: Never used  Substance and Sexual Activity   Alcohol use: Not Currently    Alcohol/week: 21.0 standard drinks of alcohol    Types: 21 Shots of liquor per week   Drug use: No   Sexual activity: Yes

## 2022-06-09 ENCOUNTER — Other Ambulatory Visit: Payer: Self-pay | Admitting: Family

## 2022-06-09 MED ORDER — HYDROCODONE-ACETAMINOPHEN 5-325 MG PO TABS: 1.0000 | ORAL_TABLET | Freq: Three times a day (TID) | ORAL | 0 refills | Status: DC | PRN

## 2022-06-10 ENCOUNTER — Encounter: Payer: Self-pay | Admitting: Cardiovascular Disease

## 2022-06-10 ENCOUNTER — Other Ambulatory Visit: Payer: Self-pay | Admitting: Cardiovascular Disease

## 2022-06-11 ENCOUNTER — Other Ambulatory Visit: Payer: Self-pay

## 2022-06-11 MED ORDER — LOSARTAN POTASSIUM 50 MG PO TABS: 50.0000 mg | ORAL_TABLET | Freq: Every day | ORAL | 0 refills | Status: DC

## 2022-06-16 ENCOUNTER — Other Ambulatory Visit: Payer: Self-pay | Admitting: Cardiovascular Disease

## 2022-06-16 ENCOUNTER — Telehealth: Payer: Self-pay | Admitting: Cardiovascular Disease

## 2022-06-16 DIAGNOSIS — I4819 Other persistent atrial fibrillation: Secondary | ICD-10-CM

## 2022-06-16 MED ORDER — TORSEMIDE 20 MG PO TABS: 20.0000 mg | ORAL_TABLET | Freq: Every day | ORAL | 0 refills | Status: DC

## 2022-06-16 MED ORDER — WARFARIN SODIUM 5 MG PO TABS: ORAL_TABLET | ORAL | 0 refills | Status: DC

## 2022-06-16 NOTE — Telephone Encounter (Signed)
*  STAT* If patient is at the pharmacy, call can be transferred to refill team.   1. Which medications need to be refilled? (please list name of each medication and dose if known)   warfarin (COUMADIN) 5 MG tablet  torsemide (DEMADEX) 20 MG tablet  2. Which pharmacy/location (including street and city if local pharmacy) is medication to be sent to?  [facility], [address] [place] [address]  3. Do they need a 30 day or 90 day supply? 90 day

## 2022-06-17 ENCOUNTER — Encounter: Payer: Self-pay | Admitting: Cardiovascular Disease

## 2022-06-19 ENCOUNTER — Other Ambulatory Visit: Payer: Self-pay | Admitting: Family

## 2022-06-19 MED ORDER — HYDROCODONE-ACETAMINOPHEN 5-325 MG PO TABS: 1.0000 | ORAL_TABLET | Freq: Three times a day (TID) | ORAL | 0 refills | Status: DC | PRN

## 2022-06-30 ENCOUNTER — Encounter: Payer: Self-pay | Admitting: Family

## 2022-06-30 ENCOUNTER — Ambulatory Visit (INDEPENDENT_AMBULATORY_CARE_PROVIDER_SITE_OTHER): Payer: Medicare HMO | Admitting: Orthopedic Surgery

## 2022-06-30 ENCOUNTER — Other Ambulatory Visit: Payer: Self-pay | Admitting: Family

## 2022-06-30 DIAGNOSIS — S81802D Unspecified open wound, left lower leg, subsequent encounter: Secondary | ICD-10-CM

## 2022-06-30 MED ORDER — HYDROCODONE-ACETAMINOPHEN 5-325 MG PO TABS: 1.0000 | ORAL_TABLET | Freq: Three times a day (TID) | ORAL | 0 refills | Status: DC | PRN

## 2022-06-30 NOTE — Progress Notes (Signed)
Office Visit Note   Patient: [name]           Date of Birth: Jun 08, 1956           MRN: [id number] Visit Date: 06/30/2022              Requested by: [name], MD [address] [place],  [place] [id number] PCP: [name], MD  Chief Complaint  Patient presents with   Left Leg - Routine Post Op    04/10/2022 LLE kerecis graft In office kerecis application with wound vac placement      HPI: Patient is a 66 year old gentleman who has had stalled healing of the wound left leg.  Patient presents at this time for Kerecis micro and Kerecis sheet tissue graft.  Assessment & Plan: Visit Diagnoses:  1. Leg wound, left, subsequent encounter     Plan: The wound VAC had a good suction fit there is no drainage.  Patient will follow-up in 1 week to change the wound VAC.  Anticipate Silvadene dressing changes after follow-up until the wound bed is stable and then advance to the compression socks.  Follow-Up Instructions: Return in about 1 week (around 07/07/2022).   Ortho Exam  Patient is alert, oriented, no adenopathy, well-dressed, normal affect, normal respiratory effort. Preoperative photos were obtained.  Measurements preoperatively show a wound that is 10 x 10 cm for 100 cm.  The wound was debrided with a 10 blade knife of fibrinous tissue back to bleeding viable granulation tissue.  After debridement the Kerecis tissue graft was applied for a total of 108 cm of tissue graft which included a 7 x 10 cm sheet and 38 cm2 micro powder.  There was no waste.  The the remaining 8 cm of tissue graft was used to fill in the depth of the wound that at places was 5 mm deep.  The wound VAC was secured with derma tack Ioban and the clear drape.  This was then covered with an Ace wrap.  For compression.  Imaging: No results found.    Labs: Lab Results  Component Value Date   HGBA1C 5.4 01/29/2022   HGBA1C 5.2 03/16/2019   REPTSTATUS 01/07/2022 FINAL 01/02/2022    REPTSTATUS 01/08/2022 FINAL 01/02/2022   GRAMSTAIN  01/02/2022    FEW WBC PRESENT,BOTH PMN AND MONONUCLEAR RARE GRAM POSITIVE COCCI RARE GRAM NEGATIVE RODS Performed at [facility], [address] [place], [place] [address]    GRAMSTAIN  01/02/2022    FEW WBC PRESENT,BOTH PMN AND MONONUCLEAR NO ORGANISMS SEEN    CULT  01/02/2022    FEW STREPTOCOCCUS GROUP C Beta hemolytic streptococci are predictably susceptible to penicillin and other beta lactams. Susceptibility testing not routinely performed. ABUNDANT KLEBSIELLA OXYTOCA ABUNDANT STENOTROPHOMONAS MALTOPHILIA    CULT  01/02/2022    NO ANAEROBES ISOLATED Performed at [facility], [address], [place],  [address]    [place] OXYTOCA 01/02/2022   LABORGA STENOTROPHOMONAS MALTOPHILIA 01/02/2022     Lab Results  Component Value Date   ALBUMIN 2.5 (L) 01/30/2022   ALBUMIN 3.1 (L) 01/29/2022   ALBUMIN 4.1 09/22/2021    Lab Results  Component Value Date   MG 2.1 01/29/2022   MG 2.1 06/19/2019   MG 2.2 03/25/2019   No results found for: "VD25OH"  No results found for: "PREALBUMIN"    Latest Ref Rng & Units 04/10/2022    8:06 AM 03/18/2022   11:08 AM 02/04/2022  8:39 AM  CBC EXTENDED  WBC 4.0 - 10.5 K/uL 6.0  6.5  8.0   RBC 4.22 - 5.81 MIL/uL 4.02  3.80  3.02   Hemoglobin 13.0 - 17.0 g/dL 9.7  9.1 Repeated and verified X2.  8.4 Repeated and verified X2.   HCT 39.0 - 52.0 % 34.4  30.0  25.8 Repeated and verified X2.   Platelets 150 - 400 K/uL 242  315.0  270.0   NEUT# 1.4 - 7.7 K/uL  4.8    Lymph# 0.7 - 4.0 K/uL  0.9       There is no height or weight on file to calculate BMI.  Orders:  No orders of the defined types were placed in this encounter.  No orders of the defined types were placed in this encounter.    Procedures: No procedures performed  Clinical Data: No additional findings.  ROS:  All other systems negative, except as noted in the HPI. Review of  Systems  Objective: Vital Signs: There were no vitals taken for this visit.  Specialty Comments:  No specialty comments available.  PMFS History: Patient Active Problem List   Diagnosis Date Noted   Rectal bleeding 01/30/2022   Abnormal finding on imaging 01/30/2022   GIB (gastrointestinal bleeding) 01/29/2022   Hyponatremia 01/29/2022   Hypocalcemia 01/29/2022   Alcohol abuse 01/29/2022   AKI (acute kidney injury) ([place]) 01/29/2022   Symptomatic anemia 01/29/2022   Venous stasis dermatitis of both lower extremities    Bleeding on Coumadin    Leg wound, left, subsequent encounter 12/30/2021   Compartment syndrome of left lower extremity ([place]) 09/22/2021   Status post surgery 09/22/2021   Compartment syndrome of left upper extremity ([place]) 09/22/2021   Hyperlipidemia 12/13/2020   Atrial fibrillation ([place]) 06/10/2020   Long term (current) use of anticoagulants 06/10/2020   Secondary hypercoagulable state ([place]) 04/24/2020   AV junctional bradycardia 03/22/2019   S/P minimally invasive mitral valve repair  03/21/2019   S/P Maze operation for atrial fibrillation 03/21/2019   Dilated aortic root ([place]) 01/04/2019   Dilated cardiomyopathy ([place]) 01/04/2019   Atrial flutter with rapid ventricular response ([place])    Mitral valve prolapse    Hypertensive urgency 12/30/2018   Acute on chronic systolic (congestive) heart failure ([place]) 66/59/9357   Acute systolic heart failure ([place]) 01/77/9390   Chronic systolic (congestive) heart failure ([facility])    Persistent atrial fibrillation ([place]) 12/02/2018   Right hamstring muscle strain 07/20/2018   Essential hypertension 02/07/2016   Severe mitral regurgitation    Annual physical exam 11/16/2014   Arthritis of left knee 10/23/2014   Status post total left knee replacement 10/23/2014   Arthritis of knee, right 01/19/2014   Status post total knee replacement 01/19/2014   Benign paroxysmal positional vertigo 10/30/2013   Testicular hypofunction  03/24/2012   Past Medical History:  Diagnosis Date   Acute on chronic systolic (congestive) heart failure ([place]) 12/30/2018   Allergy    Arthritis    Atrial flutter with rapid ventricular response ([facility])    Chronic systolic (congestive) heart failure ([place])    COVID 10/2021   mild   Dilated aortic root ([place]) 01/04/2019   Dilated cardiomyopathy ([place]) 01/04/2019   Dysrhythmia    Heart murmur    History of blood transfusion    01/27/22   History of colon polyps    Hypertension    Insomnia    Mitral valve prolapse    Mitral valve regurgitation    Persistent atrial fibrillation ([place])  12/02/2018   S/P Maze operation for atrial fibrillation 03/21/2019   Complete bilateral atrial lesion set using cryothermy and bipolar radiofrequency ablation with clipping of LA appendage via right mini thoracotomy approach   S/P minimally invasive mitral valve repair 03/21/2019   Complex valvuloplasty including triangular resection of posterior leaflet, artificial Gore-tex neochord placement x4 and 36 mm Sorin Memo 4D ring annuloplasty via right mini thoracotomy approach   Seizures ([place])    had one approx. 30 yrs ago,has not had any since; due to alchol and no sleep   Severe mitral regurgitation     Family History  Problem Relation Age of Onset   Hypertension Father    Colon cancer Father        dx in his late 22's   Diabetes Mother    Stomach cancer Neg Hx    Esophageal cancer Neg Hx    Pancreatic cancer Neg Hx    Rectal cancer Neg Hx     Past Surgical History:  Procedure Laterality Date   ANKLE FUSION  left   Dec. 8416   APPLICATION OF WOUND VAC Left 09/22/2021   Procedure: APPLICATION OF WOUND VAC;  Surgeon: Mcarthur Rossetti, MD;  Location: [facility];  Service: Orthopedics;  Laterality: Left;   APPLICATION OF WOUND VAC Left 12/30/2021   Procedure: APPLICATION OF WOUND VAC;  Surgeon: Mcarthur Rossetti, MD;  Location: [place] [facility];  Service: Orthopedics;  Laterality: Left;   APPLICATION OF  WOUND VAC Left 04/10/2022   Procedure: APPLICATION OF WOUND VAC;  Surgeon: Newt Minion, MD;  Location: [facility];  Service: Orthopedics;  Laterality: Left;   ARTHROSCOPY KNEE W/ DRILLING  bilateral   2012   BIOPSY  01/30/2022   Procedure: BIOPSY;  Surgeon: Rush Landmark Telford Nab., MD;  Location[facility];  Service: Gastroenterology;;   CARDIAC VALVE REPLACEMENT     CARDIOVERSION N/A 01/03/2019   Procedure: CARDIOVERSION;  Surgeon: Sueanne Margarita, MD;  Location[facility];  Service: Cardiovascular;  Laterality: N/A;   CARDIOVERSION N/A 06/23/2019   Procedure: CARDIOVERSION;  Surgeon: Fay Records, MD;  Location: [place];  Service: Cardiovascular;  Laterality: N/A;   CLIPPING OF ATRIAL APPENDAGE  03/21/2019   Procedure: Clipping Of Atrial Appendage using 51m Atricure Pro2 Clip;  Surgeon: ORexene Alberts MD;  [place] [facility]  Service: Open Heart Surgery;;   COLONOSCOPY     x2   COLONOSCOPY N/A 01/30/2022   Procedure: COLONOSCOPY;  Surgeon: MIrving Copas, MD;  [facility];  Service: Gastroenterology;  Laterality: N/A;   ESOPHAGOGASTRODUODENOSCOPY N/A 01/30/2022   Procedure: ESOPHAGOGASTRODUODENOSCOPY (EGD);  Surgeon: MIrving Copas, MD;  [facility];  Service: Gastroenterology;  Laterality: N/A;   FOOT ARTHRODESIS  06/23/2012   Procedure: ARTHRODESIS FOOT;  Surgeon: JWylene Simmer MD;  [place] [facility]  Service: Orthopedics;  Laterality: Left;  Left Subtalar and Talonavicular Joint Revision Arthrodesis  Aspiration of Bone Marrow from Left Hip    HAIR TRANSPLANT     HARDWARE REMOVAL  06/23/2012   Procedure: HARDWARE REMOVAL;  Surgeon: JWylene Simmer MD;  [place] [facility]  Service: Orthopedics;  Laterality: Left;  Removal of Deep Implant  X's 3   I & D EXTREMITY Left 09/23/2021   Procedure: IRRIGATION AND DEBRIDEMENT OF LEG;  Surgeon: BMcarthur Rossetti MD;  [place] [facility]  Service: Orthopedics;  Laterality: Left;   I & D EXTREMITY Left  09/26/2021   Procedure: REPEAT IRRIGATION AND DEBRIDEMENT LEFT LEG, POSSIBLE WOUND CLOSURE, POSSIBLE  VAC CHANGE, POSSIBLE SKIN GRAFT;  Surgeon: Mcarthur Rossetti, MD;  Location: [facility];  Service: Orthopedics;  Laterality: Left;   I & D EXTREMITY Left 12/30/2021   Procedure: IRRIGATION AND DEBRIDEMENT LEFT LOWER LEG WOUND;  Surgeon: Mcarthur Rossetti, MD;  Location: [facility];  Service: Orthopedics;  Laterality: Left;   I & D EXTREMITY Left 01/02/2022   Procedure: LEFT LEG DEBRIDEMENT AND TISSUE GRAFT;  Surgeon: Newt Minion, MD;  Location: [place] [place];  Service: Orthopedics;  Laterality: Left;   INCISION AND DRAINAGE WOUND WITH FASCIOTOMY Left 09/22/2021   Procedure: INCISION AND DRAINAGE WOUND WITH FASCIOTOMY;  Surgeon: Mcarthur Rossetti, MD;  Location: [facility];  Service: Orthopedics;  Laterality: Left;   INSERTION OF MESH N/A 07/10/2016   Procedure: INSERTION OF MESH;  Surgeon: Coralie Keens, MD;  Location: [facility];  Service: General;  Laterality: N/A;   JOINT REPLACEMENT     right hip  01-2011   LEFT HEART CATH AND CORONARY ANGIOGRAPHY N/A 03/16/2019   Procedure: LEFT HEART CATH AND CORONARY ANGIOGRAPHY;  Surgeon: Sherren Mocha, MD;  Location: [facility];  Service: Cardiovascular;  Laterality: N/A;   LIMB SPARING RESECTION HIP W/ SADDLE JOINT REPLACEMENT Right    MINIMALLY INVASIVE MAZE PROCEDURE N/A 03/21/2019   Procedure: MINIMALLY INVASIVE MAZE PROCEDURE;  Surgeon: Rexene Alberts, MD;  Location: [facility];  Service: Open Heart Surgery;  Laterality: N/A;   MITRAL VALVE REPAIR Right 03/21/2019   Procedure: MINIMALLY INVASIVE MITRAL VALVE REPAIR (MVR) using Memo 4D ring size 36;  Surgeon: Rexene Alberts, MD;  Location: [facility];  Service: Open Heart Surgery;  Laterality: Right;   POLYPECTOMY  01/30/2022   Procedure: POLYPECTOMY;  Surgeon: Mansouraty, Telford Nab., MD;  Location[facility];  Service: Gastroenterology;;   SKIN SPLIT GRAFT Left 04/10/2022   Procedure:  APPLY SKIN GRAFT TO LEFT LEG WOUND;  Surgeon: Newt Minion, MD;  Location: [facility];  Service: Orthopedics;  Laterality: Left;   TEE WITHOUT CARDIOVERSION N/A 01/03/2019   Procedure: TRANSESOPHAGEAL ECHOCARDIOGRAM (TEE);  Surgeon: Sueanne Margarita, MD;  Location[facility];  Service: Cardiovascular;  Laterality: N/A;   TEE WITHOUT CARDIOVERSION N/A 03/21/2019   Procedure: TRANSESOPHAGEAL ECHOCARDIOGRAM (TEE);  Surgeon: Rexene Alberts, MD;  Location: [facility];  Service: Open Heart Surgery;  Laterality: N/A;   TEMPORARY PACEMAKER N/A 03/22/2019   Procedure: TEMPORARY PACEMAKER;  Surgeon: Belva Crome, MD;  Location: [facility];  Service: Cardiovascular;  Laterality: N/A;   TOTAL KNEE ARTHROPLASTY Right 01/19/2014   Procedure: RIGHT TOTAL KNEE ARTHROPLASTY, Steroid injection left knee;  Surgeon: Mcarthur Rossetti, MD;  [facility];  Service: Orthopedics;  Laterality: Right;   TOTAL KNEE ARTHROPLASTY Left 10/23/2014   Procedure: LEFT TOTAL KNEE ARTHROPLASTY;  Surgeon: Mcarthur Rossetti, MD;  [facility];  Service: Orthopedics;  Laterality: Left;   UMBILICAL HERNIA REPAIR N/A 07/10/2016   Procedure: UMBILICAL HERNIA REPAIR;  Surgeon: Coralie Keens, MD;  Location: [facility];  Service: General;  Laterality: N/A;   Social History   Occupational History   Occupation: Pension scheme manager  Tobacco Use   Smoking status: Never   Smokeless tobacco: Never  Vaping Use   Vaping Use: Never used  Substance and Sexual Activity   Alcohol use: Not Currently    Alcohol/week: 21.0 standard drinks of alcohol    Types: 21 Shots of liquor per week   Drug use: No   Sexual activity: Yes

## 2022-07-02 ENCOUNTER — Telehealth: Payer: Self-pay | Admitting: Orthopedic Surgery

## 2022-07-02 NOTE — Telephone Encounter (Signed)
My chart message was sent to pt and he replied will pick up canister tomorrow.

## 2022-07-02 NOTE — Telephone Encounter (Signed)
I called pt again at the number that was provided by his wife and the line remains busy. His wife ask that I not call her cell phone to reach the pt even though this is the number that he left to get in touch with him. I will continue to hold this message and try to reach the pt at the number provided by his wife.

## 2022-07-02 NOTE — Telephone Encounter (Signed)
I called the number that was listed in this message. It was the pt's wife cell phone number. She advised that we should not call this number and gave the pt's work number and advised to call him at [phone]. I advised his wife that this was the number the pt gave as a contact in his message and she again advised to call him at [facility]. I called that number x 3 and the line was busy. I will hold message and try again.

## 2022-07-02 NOTE — Telephone Encounter (Signed)
Pt called stating he need another cartridge for wound vac until his next appt. Please call if he can pick one up from our office. Pt phone number is [phone]

## 2022-07-03 NOTE — Telephone Encounter (Signed)
Given pt two canisters this morning

## 2022-07-07 ENCOUNTER — Ambulatory Visit: Payer: Medicare HMO | Admitting: Orthopedic Surgery

## 2022-07-07 ENCOUNTER — Encounter: Payer: Self-pay | Admitting: Orthopedic Surgery

## 2022-07-07 DIAGNOSIS — S81802D Unspecified open wound, left lower leg, subsequent encounter: Secondary | ICD-10-CM

## 2022-07-07 MED ORDER — SILVER SULFADIAZINE 1 % EX CREA: 1.0000 | TOPICAL_CREAM | Freq: Every day | CUTANEOUS | 3 refills | Status: AC

## 2022-07-07 NOTE — Progress Notes (Signed)
Office Visit Note   Patient: [name]           Date of Birth: 10-Oct-1956           MRN: [id number] Visit Date: 07/07/2022              Requested by: [name], MD [place],  [place] [id number] PCP: [name], MD  Chief Complaint  Patient presents with   Left Leg - Routine Post Op    04/10/22 LLE kerecis graft 06/30/22 in office kerecis application micro 38 cm sq and 7 x 10 sheet      HPI: Patient is a 66 year old gentleman who presents 1 week status post application of Kerecis tissue graft in the office on 06/30/2022.  Patient states he used up for of the Praveena plus canisters.  Assessment & Plan: Visit Diagnoses:  1. Leg wound, left, subsequent encounter     Plan: We will start Dial soap cleansing Silvadene 4 x 4 gauze plus his compression sock dressing changes daily.  Follow-up in 1 week and evaluate for placement of of the Kerecis micro graft.  Follow-Up Instructions: Return in about 1 week (around 07/14/2022).   Ortho Exam  Patient is alert, oriented, no adenopathy, well-dressed, normal affect, normal respiratory effort. Examination patient does have some maceration around the wound.  Most likely secondary to increased drainage.  The wound bed has excellent granulation tissue is smaller.  Imaging: No results found.   Labs: Lab Results  Component Value Date   HGBA1C 5.4 01/29/2022   HGBA1C 5.2 03/16/2019   REPTSTATUS 01/07/2022 FINAL 01/02/2022   REPTSTATUS 01/08/2022 FINAL 01/02/2022   GRAMSTAIN  01/02/2022    FEW WBC PRESENT,BOTH PMN AND MONONUCLEAR RARE GRAM POSITIVE COCCI RARE GRAM NEGATIVE RODS Performed at [facility], [address] [place], [place] [address]    GRAMSTAIN  01/02/2022    FEW WBC PRESENT,BOTH PMN AND MONONUCLEAR NO ORGANISMS SEEN    CULT  01/02/2022    FEW STREPTOCOCCUS GROUP C Beta hemolytic streptococci are predictably susceptible to penicillin and other beta lactams.  Susceptibility testing not routinely performed. ABUNDANT KLEBSIELLA OXYTOCA ABUNDANT STENOTROPHOMONAS MALTOPHILIA    CULT  01/02/2022    NO ANAEROBES ISOLATED Performed at [facility], [place] [address] [place], [place] [address]    [place] OXYTOCA 01/02/2022   LABORGA STENOTROPHOMONAS MALTOPHILIA 01/02/2022     Lab Results  Component Value Date   ALBUMIN 2.5 (L) 01/30/2022   ALBUMIN 3.1 (L) 01/29/2022   ALBUMIN 4.1 09/22/2021    Lab Results  Component Value Date   MG 2.1 01/29/2022   MG 2.1 06/19/2019   MG 2.2 03/25/2019   No results found for: "VD25OH"  No results found for: "PREALBUMIN"    Latest Ref Rng & Units 04/10/2022    8:06 AM 03/18/2022   11:08 AM 02/04/2022    8:39 AM  CBC EXTENDED  WBC 4.0 - 10.5 K/uL 6.0  6.5  8.0   RBC 4.22 - 5.81 MIL/uL 4.02  3.80  3.02   Hemoglobin 13.0 - 17.0 g/dL 9.7  9.1 Repeated and verified X2.  8.4 Repeated and verified X2.   HCT 39.0 - 52.0 % 34.4  30.0  25.8 Repeated and verified X2.   Platelets 150 - 400 K/uL 242  315.0  270.0   NEUT# 1.4 - 7.7 K/uL  4.8    Lymph# 0.7 - 4.0 K/uL  0.9  There is no height or weight on file to calculate BMI.  Orders:  No orders of the defined types were placed in this encounter.  Meds ordered this encounter  Medications   silver sulfADIAZINE (SILVADENE) 1 % cream    Sig: Apply 1 Application topically daily. Apply to affected area daily plus dry dressing    Dispense:  400 g    Refill:  3     Procedures: No procedures performed  Clinical Data: No additional findings.  ROS:  All other systems negative, except as noted in the HPI. Review of Systems  Objective: Vital Signs: There were no vitals taken for this visit.  Specialty Comments:  No specialty comments available.  PMFS History: Patient Active Problem List   Diagnosis Date Noted   Rectal bleeding 01/30/2022   Abnormal finding on imaging 01/30/2022   GIB (gastrointestinal bleeding) 01/29/2022    Hyponatremia 01/29/2022   Hypocalcemia 01/29/2022   Alcohol abuse 01/29/2022   AKI (acute kidney injury) ([place]) 01/29/2022   Symptomatic anemia 01/29/2022   Venous stasis dermatitis of both lower extremities    Bleeding on Coumadin    Leg wound, left, subsequent encounter 12/30/2021   Compartment syndrome of left lower extremity ([place]) 09/22/2021   Status post surgery 09/22/2021   Compartment syndrome of left upper extremity ([place]) 09/22/2021   Hyperlipidemia 12/13/2020   Atrial fibrillation ([place]) 06/10/2020   Long term (current) use of anticoagulants 06/10/2020   Secondary hypercoagulable state ([place]) 04/24/2020   AV junctional bradycardia 03/22/2019   S/P minimally invasive mitral valve repair  03/21/2019   S/P Maze operation for atrial fibrillation 03/21/2019   Dilated aortic root ([place]) 01/04/2019   Dilated cardiomyopathy ([place]) 01/04/2019   Atrial flutter with rapid ventricular response ([place])    Mitral valve prolapse    Hypertensive urgency 12/30/2018   Acute on chronic systolic (congestive) heart failure ([place]) 62/26/3335   Acute systolic heart failure ([place]) 45/62/5638   Chronic systolic (congestive) heart failure (HCC)    Persistent atrial fibrillation ([place]) 12/02/2018   Right hamstring muscle strain 07/20/2018   Essential hypertension 02/07/2016   Severe mitral regurgitation    Annual physical exam 11/16/2014   Arthritis of left knee 10/23/2014   Status post total left knee replacement 10/23/2014   Arthritis of knee, right 01/19/2014   Status post total knee replacement 01/19/2014   Benign paroxysmal positional vertigo 10/30/2013   Testicular hypofunction 03/24/2012   Past Medical History:  Diagnosis Date   Acute on chronic systolic (congestive) heart failure ([place]) 12/30/2018   Allergy    Arthritis    Atrial flutter with rapid ventricular response (HCC)    Chronic systolic (congestive) heart failure ([place])    COVID 10/2021   mild   Dilated aortic root ([place]) 01/04/2019    Dilated cardiomyopathy ([place]) 01/04/2019   Dysrhythmia    Heart murmur    History of blood transfusion    01/27/22   History of colon polyps    Hypertension    Insomnia    Mitral valve prolapse    Mitral valve regurgitation    Persistent atrial fibrillation ([place]) 12/02/2018   S/P Maze operation for atrial fibrillation 03/21/2019   Complete bilateral atrial lesion set using cryothermy and bipolar radiofrequency ablation with clipping of LA appendage via right mini thoracotomy approach   S/P minimally invasive mitral valve repair 03/21/2019   Complex valvuloplasty including triangular resection of posterior leaflet, artificial Gore-tex neochord placement x4 and 36 mm Sorin Memo 4D ring annuloplasty via right  mini thoracotomy approach   Seizures ([place])    had one approx. 30 yrs ago,has not had any since; due to alchol and no sleep   Severe mitral regurgitation     Family History  Problem Relation Age of Onset   Hypertension Father    Colon cancer Father        dx in his late 46's   Diabetes Mother    Stomach cancer Neg Hx    Esophageal cancer Neg Hx    Pancreatic cancer Neg Hx    Rectal cancer Neg Hx     Past Surgical History:  Procedure Laterality Date   ANKLE FUSION  left   Dec. 7353   APPLICATION OF WOUND VAC Left 09/22/2021   Procedure: APPLICATION OF WOUND VAC;  Surgeon: Mcarthur Rossetti, MD;  Location: [facility];  Service: Orthopedics;  Laterality: Left;   APPLICATION OF WOUND VAC Left 12/30/2021   Procedure: APPLICATION OF WOUND VAC;  Surgeon: Mcarthur Rossetti, MD;  Location: [facility];  Service: Orthopedics;  Laterality: Left;   APPLICATION OF WOUND VAC Left 04/10/2022   Procedure: APPLICATION OF WOUND VAC;  Surgeon: Newt Minion, MD;  Location: [facility];  Service: Orthopedics;  Laterality: Left;   ARTHROSCOPY KNEE W/ DRILLING  bilateral   2012   BIOPSY  01/30/2022   Procedure: BIOPSY;  Surgeon: Rush Landmark Telford Nab., MD;  Location[facility];  Service:  Gastroenterology;;   CARDIAC VALVE REPLACEMENT     CARDIOVERSION N/A 01/03/2019   Procedure: CARDIOVERSION;  Surgeon: Sueanne Margarita, MD;  Location: [facility];  Service: Cardiovascular;  Laterality: N/A;   CARDIOVERSION N/A 06/23/2019   Procedure: CARDIOVERSION;  Surgeon: Fay Records, MD;  Location: [facility];  Service: Cardiovascular;  Laterality: N/A;   CLIPPING OF ATRIAL APPENDAGE  03/21/2019   Procedure: Clipping Of Atrial Appendage using 61m Atricure Pro2 Clip;  Surgeon: ORexene Alberts MD;  [place] [facility]  Service: Open Heart Surgery;;   COLONOSCOPY     x2   COLONOSCOPY N/A 01/30/2022   Procedure: COLONOSCOPY;  Surgeon: MIrving Copas, MD;  [facility];  Service: Gastroenterology;  Laterality: N/A;   ESOPHAGOGASTRODUODENOSCOPY N/A 01/30/2022   Procedure: ESOPHAGOGASTRODUODENOSCOPY (EGD);  Surgeon: MIrving Copas, MD;  [facility];  Service: Gastroenterology;  Laterality: N/A;   FOOT ARTHRODESIS  06/23/2012   Procedure: ARTHRODESIS FOOT;  Surgeon: JWylene Simmer MD;  [place] [facility]  Service: Orthopedics;  Laterality: Left;  Left Subtalar and Talonavicular Joint Revision Arthrodesis  Aspiration of Bone Marrow from Left Hip    HAIR TRANSPLANT     HARDWARE REMOVAL  06/23/2012   Procedure: HARDWARE REMOVAL;  Surgeon: JWylene Simmer MD;  Location[place] [facility]  Service: Orthopedics;  Laterality: Left;  Removal of Deep Implant  X's 3   I & D EXTREMITY Left 09/23/2021   Procedure: IRRIGATION AND DEBRIDEMENT OF LEG;  Surgeon: BMcarthur Rossetti MD;  [place] [facility]  Service: Orthopedics;  Laterality: Left;   I & D EXTREMITY Left 09/26/2021   Procedure: REPEAT IRRIGATION AND DEBRIDEMENT LEFT LEG, POSSIBLE WOUND CLOSURE, POSSIBLE VAC CHANGE, POSSIBLE SKIN GRAFT;  Surgeon: BMcarthur Rossetti MD;  [place] [facility]  Service: Orthopedics;  Laterality: Left;   I & D EXTREMITY Left 12/30/2021   Procedure: IRRIGATION AND DEBRIDEMENT LEFT LOWER LEG WOUND;   Surgeon: BMcarthur Rossetti MD;  [place] [facility]  Service: Orthopedics;  Laterality: Left;   I & D EXTREMITY Left 01/02/2022   Procedure: LEFT  LEG DEBRIDEMENT AND TISSUE GRAFT;  Surgeon: Newt Minion, MD;  Location: [facility];  Service: Orthopedics;  Laterality: Left;   INCISION AND DRAINAGE WOUND WITH FASCIOTOMY Left 09/22/2021   Procedure: INCISION AND DRAINAGE WOUND WITH FASCIOTOMY;  Surgeon: Mcarthur Rossetti, MD;  Location: [facility];  Service: Orthopedics;  Laterality: Left;   INSERTION OF MESH N/A 07/10/2016   Procedure: INSERTION OF MESH;  Surgeon: Coralie Keens, MD;  Location: [facility];  Service: General;  Laterality: N/A;   JOINT REPLACEMENT     right hip  01-2011   LEFT HEART CATH AND CORONARY ANGIOGRAPHY N/A 03/16/2019   Procedure: LEFT HEART CATH AND CORONARY ANGIOGRAPHY;  Surgeon: Sherren Mocha, MD;  Location: [facility];  Service: Cardiovascular;  Laterality: N/A;   LIMB SPARING RESECTION HIP W/ SADDLE JOINT REPLACEMENT Right    MINIMALLY INVASIVE MAZE PROCEDURE N/A 03/21/2019   Procedure: MINIMALLY INVASIVE MAZE PROCEDURE;  Surgeon: Rexene Alberts, MD;  Location: [facility];  Service: Open Heart Surgery;  Laterality: N/A;   MITRAL VALVE REPAIR Right 03/21/2019   Procedure: MINIMALLY INVASIVE MITRAL VALVE REPAIR (MVR) using Memo 4D ring size 36;  Surgeon: Rexene Alberts, MD;  Location: [facility];  Service: Open Heart Surgery;  Laterality: Right;   POLYPECTOMY  01/30/2022   Procedure: POLYPECTOMY;  Surgeon: Mansouraty, Telford Nab., MD;  Location[facility];  Service: Gastroenterology;;   SKIN SPLIT GRAFT Left 04/10/2022   Procedure: APPLY SKIN GRAFT TO LEFT LEG WOUND;  Surgeon: Newt Minion, MD;  Location: [facility];  Service: Orthopedics;  Laterality: Left;   TEE WITHOUT CARDIOVERSION N/A 01/03/2019   Procedure: TRANSESOPHAGEAL ECHOCARDIOGRAM (TEE);  Surgeon: Sueanne Margarita, MD;  Location: [facility] ENDOSCOPY;  Service: Cardiovascular;  Laterality: N/A;   TEE  WITHOUT CARDIOVERSION N/A 03/21/2019   Procedure: TRANSESOPHAGEAL ECHOCARDIOGRAM (TEE);  Surgeon: Rexene Alberts, MD;  Location: [place];  Service: Open Heart Surgery;  Laterality: N/A;   TEMPORARY PACEMAKER N/A 03/22/2019   Procedure: TEMPORARY PACEMAKER;  Surgeon: Belva Crome, MD;  Location: [facility];  Service: Cardiovascular;  Laterality: N/A;   TOTAL KNEE ARTHROPLASTY Right 01/19/2014   Procedure: RIGHT TOTAL KNEE ARTHROPLASTY, Steroid injection left knee;  Surgeon: Mcarthur Rossetti, MD;  [facility];  Service: Orthopedics;  Laterality: Right;   TOTAL KNEE ARTHROPLASTY Left 10/23/2014   Procedure: LEFT TOTAL KNEE ARTHROPLASTY;  Surgeon: Mcarthur Rossetti, MD;  [facility];  Service: Orthopedics;  Laterality: Left;   UMBILICAL HERNIA REPAIR N/A 07/10/2016   Procedure: UMBILICAL HERNIA REPAIR;  Surgeon: Coralie Keens, MD;  Location: [facility];  Service: General;  Laterality: N/A;   Social History   Occupational History   Occupation: Pension scheme manager  Tobacco Use   Smoking status: Never   Smokeless tobacco: Never  Vaping Use   Vaping Use: Never used  Substance and Sexual Activity   Alcohol use: Not Currently    Alcohol/week: 21.0 standard drinks of alcohol    Types: 21 Shots of liquor per week   Drug use: No   Sexual activity: Yes

## 2022-07-10 ENCOUNTER — Other Ambulatory Visit: Payer: Self-pay | Admitting: Family

## 2022-07-10 MED ORDER — HYDROCODONE-ACETAMINOPHEN 5-325 MG PO TABS: 1.0000 | ORAL_TABLET | Freq: Three times a day (TID) | ORAL | 0 refills | Status: DC | PRN

## 2022-07-14 ENCOUNTER — Ambulatory Visit (INDEPENDENT_AMBULATORY_CARE_PROVIDER_SITE_OTHER): Payer: Medicare HMO

## 2022-07-14 ENCOUNTER — Ambulatory Visit: Payer: Medicare HMO | Attending: Cardiovascular Disease | Admitting: Cardiovascular Disease

## 2022-07-14 ENCOUNTER — Encounter: Payer: Self-pay | Admitting: Cardiovascular Disease

## 2022-07-14 VITALS — BP 136/90 | HR 115 | Ht 75.0 in | Wt 249.6 lb

## 2022-07-14 DIAGNOSIS — Z5181 Encounter for therapeutic drug level monitoring: Secondary | ICD-10-CM

## 2022-07-14 DIAGNOSIS — E782 Mixed hyperlipidemia: Secondary | ICD-10-CM

## 2022-07-14 DIAGNOSIS — I5022 Chronic systolic (congestive) heart failure: Secondary | ICD-10-CM | POA: Diagnosis not present

## 2022-07-14 DIAGNOSIS — I4892 Unspecified atrial flutter: Secondary | ICD-10-CM

## 2022-07-14 DIAGNOSIS — I4819 Other persistent atrial fibrillation: Secondary | ICD-10-CM | POA: Diagnosis not present

## 2022-07-14 DIAGNOSIS — I340 Nonrheumatic mitral (valve) insufficiency: Secondary | ICD-10-CM | POA: Diagnosis not present

## 2022-07-14 DIAGNOSIS — Z7901 Long term (current) use of anticoagulants: Secondary | ICD-10-CM

## 2022-07-14 DIAGNOSIS — I10 Essential (primary) hypertension: Secondary | ICD-10-CM

## 2022-07-14 LAB — POCT INR: INR: 1.8 — AB (ref 2.0–3.0)

## 2022-07-14 NOTE — Progress Notes (Signed)
07/14/2022 [name]   1955/11/12  [id number]  Primary Physician [name], MD Primary Cardiologist: Lorretta Harp MD Lupe Car[place], [place]  HPI:  [name] is a 66 y.o.  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 He was referred by Dr. Coletta Memos for cardiovascular evaluation because of hypertension and mitral regurgitation.  I last saw him in the office 12/13/2020.  His only cardiac risk factor is treated hypertension. He does not smoke. He's never had a heart attack or stroke. He is on 3 antihypertensive medications and is aware of some restriction. His last 2-D echocardiogram performed 11/25/12 revealed mildly dilated left ventricle with normal systolic function, moderate to severe MR and severe left atrial enlargement. He remains in sinus rhythm and otherwise is asymptomatic.     When I saw him in the office last he was in A. fib with RVR   He does complain of some increasing shortness of breath as well.  He appeared to be volume overloaded, and and heart failure.  I arrange for him to be admitted to the hospital where he was diuresed and underwent TEE guided DC cardioversion back to sinus rhythm.  He is on Eliquis.  He was seen by Dr. Roxy Manns who agreed that he needed mitral valve repair.  His EF was 35 to 40% with severe MR and a flail P2 segment of the posterior leaflet with a ruptured chordae.  He is lost from 287 down to 241 pounds.  He is in sinus rhythm clinically and is improved.   He ultimately underwent cardiac catheterization by Dr. Burt Knack 03/16/2019 revealing normal coronary arteries.  5 days later on 03/21/2019 he underwent minimally invasive mitral valve repair along with a surgical Maze procedure and left atrial clipping.  He was discharged home on 03/28/2019 on Coumadin anticoagulation.  He was seen by Kerin Ransom in the office 05/29/2019 and was found to be in atrial fibrillation.  He was on  amiodarone.  He underwent outpatient cardioversion successfully on 06/22/2019 by Dr. Dorris Carnes.  A 2D echo performed 05/26/2019 revealed improvement in his EF to normal with no evidence of mitral regurgitation.   Since I saw him in the office a year and a half ago he continues to do well.  He is completely asymptomatic.  He remains on warfarin.  His last 2D echo performed 08/08/2021 revealed mild decline in LV function to 45 to 50%.   Current Meds  Medication Sig   ALPRAZolam (XANAX) 0.5 MG tablet Take 0.5 mg by mouth 2 (two) times daily.   atorvastatin (LIPITOR) 20 MG tablet Take 20 mg by mouth every morning.   diphenhydrAMINE (BENADRYL) 2 % cream Apply 1 Application topically 3 (three) times daily as needed (seasonal allergies.).   diphenhydrAMINE (BENADRYL) 25 mg capsule Take 25 mg by mouth every 6 (six) hours as needed (seasonal allergies.).   docusate sodium (COLACE) 100 MG capsule Take 300 mg by mouth in the morning.   HYDROcodone-acetaminophen (NORCO/VICODIN) 5-325 MG tablet Take 1 tablet by mouth every 8 (eight) hours as needed.   losartan (COZAAR) 50 MG tablet Take 1 tablet (50 mg total) by mouth daily.   meclizine (ANTIVERT) 25 MG tablet Take 50 mg by mouth in the morning.   Melatonin 10 MG TABS Take 10 mg by mouth at bedtime.   metoprolol tartrate (LOPRESSOR) 25 MG tablet TAKE 1/2 (ONE-HALF) TABLET BY MOUTH TWICE DAILY .  APPOINTMENT MUST KEEP SCHEDULED APPOINTMENT FOR FUTURE REFILLS   Multiple Vitamin (MULTIVITAMIN WITH MINERALS) TABS tablet Take 1 tablet by mouth every morning.   naloxone (NARCAN) nasal spray 4 mg/0.1 mL Place 1 spray into the nose once as needed (opioid overdose).   potassium chloride (KLOR-CON M) 10 MEQ tablet TAKE 1 TABLET BY MOUTH TWICE DAILY . APPOINTMENT REQUIRED FOR FUTURE REFILLS   silver sulfADIAZINE (SILVADENE) 1 % cream Apply 1 Application topically daily. Apply to affected area daily plus dry dressing   torsemide (DEMADEX) 20 MG tablet Take 1 tablet (20 mg  total) by mouth daily.   warfarin (COUMADIN) 5 MG tablet TAKE AS INSTRUCTED -TAKE 1 TABLET (5 MG) BY MOUTH EVERYDAY EXCEPT WEDNESDAY - TAKE 1.5 TABLET (7.5 MG) BY MOUTH ON WEDNESDAY     Allergies  Allergen Reactions   Fentanyl Itching    Social History   Socioeconomic History   Marital status: Married    Spouse name: Not on file   Number of children: 1   Years of education: Not on file   Highest education level: Not on file  Occupational History   Occupation: Pension scheme manager  Tobacco Use   Smoking status: Never   Smokeless tobacco: Never  Vaping Use   Vaping Use: Never used  Substance and Sexual Activity   Alcohol use: Not Currently    Alcohol/week: 21.0 standard drinks of alcohol    Types: 21 Shots of liquor per week   Drug use: No   Sexual activity: Yes  Other Topics Concern   Not on file  Social History Narrative   Not on file   Social Determinants of Health   Financial Resource Strain: Not on file  Food Insecurity: Not on file  Transportation Needs: Not on file  Physical Activity: Not on file  Stress: Not on file  Social Connections: Not on file  Intimate Partner Violence: Not on file     Review of Systems: General: negative for chills, fever, night sweats or weight changes.  Cardiovascular: negative for chest pain, dyspnea on exertion, edema, orthopnea, palpitations, paroxysmal nocturnal dyspnea or shortness of breath Dermatological: negative for rash Respiratory: negative for cough or wheezing Urologic: negative for hematuria Abdominal: negative for nausea, vomiting, diarrhea, bright red blood per rectum, melena, or hematemesis Neurologic: negative for visual changes, syncope, or dizziness All other systems reviewed and are otherwise negative except as noted above.    Blood pressure (!) 136/90, pulse (!) 115, height '6\' 3"'$  (1.905 m), weight 249 lb 9.6 oz (113.2 kg), SpO2 98 %.  General appearance: alert and no distress Neck: no adenopathy, no carotid  bruit, no JVD, supple, symmetrical, trachea midline, and thyroid not enlarged, symmetric, no tenderness/mass/nodules Lungs: clear to auscultation bilaterally Heart: irregularly irregular rhythm Extremities: extremities normal, atraumatic, no cyanosis or edema Pulses: 2+ and symmetric Skin: Skin color, texture, turgor normal. No rashes or lesions Neurologic: Grossly normal  EKG a fib with ventricular sponsor 115 left axis deviation with right bundle branch block.  Personally reviewed this EKG.  ASSESSMENT AND PLAN:   Essential hypertension History of essential hypertension a blood pressure measured today at 136/90.  He is on losartan and metoprolol.  Severe mitral regurgitation History of severe MR with heart failure that is nonischemic in nature.  He ultimately underwent minimally invasive mitral valve repair by Dr. Roxy Manns with surgical Maze procedure and left atrial clipping May 2020.  His follow-up echo a year ago revealed mild LV dysfunction.  He is asymptomatic.  Persistent  atrial fibrillation (HCC) Persistent atrial fib with rapid ventricular response today 115 on warfarin anticoagulation.  Hyperlipidemia History of hyperlipidemia on statin therapy followed by his PCP.  His most recent lipid profile performed 04/14/2022 revealing total cholesterol 156, LDL 66 and HDL of 78.     Lorretta Harp MD FACP,FACC,FAHA, [facility] 07/14/2022 12:00 PM

## 2022-07-14 NOTE — Assessment & Plan Note (Signed)
Persistent atrial fib with rapid ventricular response today 115 on warfarin anticoagulation.

## 2022-07-14 NOTE — Assessment & Plan Note (Addendum)
History of essential hypertension a blood pressure measured today at 136/90.  He is on losartan and metoprolol.

## 2022-07-14 NOTE — Patient Instructions (Signed)
Medication Instructions:  Your physician recommends that you continue on your current medications as directed. Please refer to the Current Medication list given to you today.  *If you need a refill on your cardiac medications before your next appointment, please call your pharmacy*   Testing/Procedures: Your physician has requested that you have an echocardiogram. Echocardiography is a painless test that uses sound waves to create images of your heart. It provides your doctor with information about the size and shape of your heart and how well your heart's chambers and valves are working. This procedure takes approximately one hour. There are no restrictions for this procedure. To be done in October. This procedure will be done at [address]    Follow-Up: At [facility], you and your health needs are our priority.  As part of our continuing mission to provide you with exceptional heart care, we have created designated Provider Care Teams.  These Care Teams include your primary Cardiologist (physician) and Advanced Practice Providers (APPs -  Physician Assistants and Nurse Practitioners) who all work together to provide you with the care you need, when you need it.  We recommend signing up for the patient portal called "MyChart".  Sign up information is provided on this After Visit Summary.  MyChart is used to connect with patients for Virtual Visits (Telemedicine).  Patients are able to view lab/test results, encounter notes, upcoming appointments, etc.  Non-urgent messages can be sent to your provider as well.   To learn more about what you can do with MyChart, go to [email or web address].    Your next appointment:   12 month(s)  The format for your next appointment:   In Person  Provider:   Quay Burow, MD

## 2022-07-14 NOTE — Assessment & Plan Note (Signed)
History of hyperlipidemia on statin therapy followed by his PCP.  His most recent lipid profile performed 04/14/2022 revealing total cholesterol 156, LDL 66 and HDL of 78.

## 2022-07-14 NOTE — Assessment & Plan Note (Signed)
History of severe MR with heart failure that is nonischemic in nature.  He ultimately underwent minimally invasive mitral valve repair by Dr. Roxy Manns with surgical Maze procedure and left atrial clipping May 2020.  His follow-up echo a year ago revealed mild LV dysfunction.  He is asymptomatic.

## 2022-07-14 NOTE — Patient Instructions (Signed)
TAKE 2 TABLETS TODAY and then Continue taking 1 tablet daily except 1.5 tablets on Wednesdays.  [phone]. INR 8 weeks

## 2022-07-16 ENCOUNTER — Encounter: Payer: Self-pay | Admitting: Orthopedic Surgery

## 2022-07-16 ENCOUNTER — Ambulatory Visit: Payer: Medicare HMO | Admitting: Orthopedic Surgery

## 2022-07-16 DIAGNOSIS — S81802D Unspecified open wound, left lower leg, subsequent encounter: Secondary | ICD-10-CM

## 2022-07-16 NOTE — Progress Notes (Signed)
Office Visit Note   Patient: [name]           Date of Birth: 1955-12-06           MRN: [id number] Visit Date: 07/16/2022              Requested by: [name], MD [place],  [place] [id number] PCP: [name], MD  Chief Complaint  Patient presents with   Left Leg - Follow-up    04/09/1274 LLE application skin graft on OR 1/70/0174 in office application Kerecis graft       HPI: Patient is a 66 year old gentleman who is 5 weeks status post application of Kerecis tissue graft left lower extremity.  Assessment & Plan: Visit Diagnoses:  1. Leg wound, left, subsequent encounter     Plan: Continue with Silvadene dry dressing and compression at follow-up in 2 weeks plan for application of Kerecis micro graft.  Follow-Up Instructions: Return in about 2 weeks (around 07/30/2022).   Ortho Exam  Patient is alert, oriented, no adenopathy, well-dressed, normal affect, normal respiratory effort. Examination the wound is healthy viable there is some hypergranulation tissue there is interval healing around the wound edges.  The hypergranulation tissue was touched with silver nitrate.  There is no maceration the drainage has decreased the wound is smaller  Imaging: No results found.     Labs: Lab Results  Component Value Date   HGBA1C 5.4 01/29/2022   HGBA1C 5.2 03/16/2019   REPTSTATUS 01/07/2022 FINAL 01/02/2022   REPTSTATUS 01/08/2022 FINAL 01/02/2022   GRAMSTAIN  01/02/2022    FEW WBC PRESENT,BOTH PMN AND MONONUCLEAR RARE GRAM POSITIVE COCCI RARE GRAM NEGATIVE RODS Performed at [facility], [address], [place], [place] [address]    GRAMSTAIN  01/02/2022    FEW WBC PRESENT,BOTH PMN AND MONONUCLEAR NO ORGANISMS SEEN    CULT  01/02/2022    FEW STREPTOCOCCUS GROUP C Beta hemolytic streptococci are predictably susceptible to penicillin and other beta lactams. Susceptibility testing not routinely performed. ABUNDANT  KLEBSIELLA OXYTOCA ABUNDANT STENOTROPHOMONAS MALTOPHILIA    CULT  01/02/2022    NO ANAEROBES ISOLATED Performed at [facility], [place] [address], [place], [place] [address]    [place] OXYTOCA 01/02/2022   LABORGA STENOTROPHOMONAS MALTOPHILIA 01/02/2022     Lab Results  Component Value Date   ALBUMIN 2.5 (L) 01/30/2022   ALBUMIN 3.1 (L) 01/29/2022   ALBUMIN 4.1 09/22/2021    Lab Results  Component Value Date   MG 2.1 01/29/2022   MG 2.1 06/19/2019   MG 2.2 03/25/2019   No results found for: "VD25OH"  No results found for: "PREALBUMIN"    Latest Ref Rng & Units 04/10/2022    8:06 AM 03/18/2022   11:08 AM 02/04/2022    8:39 AM  CBC EXTENDED  WBC 4.0 - 10.5 K/uL 6.0  6.5  8.0   RBC 4.22 - 5.81 MIL/uL 4.02  3.80  3.02   Hemoglobin 13.0 - 17.0 g/dL 9.7  9.1 Repeated and verified X2.  8.4 Repeated and verified X2.   HCT 39.0 - 52.0 % 34.4  30.0  25.8 Repeated and verified X2.   Platelets 150 - 400 K/uL 242  315.0  270.0   NEUT# 1.4 - 7.7 K/uL  4.8    Lymph# 0.7 - 4.0 K/uL  0.9       There is no height or weight on file to calculate BMI.  Orders:  No  orders of the defined types were placed in this encounter.  No orders of the defined types were placed in this encounter.    Procedures: No procedures performed  Clinical Data: No additional findings.  ROS:  All other systems negative, except as noted in the HPI. Review of Systems  Objective: Vital Signs: There were no vitals taken for this visit.  Specialty Comments:  No specialty comments available.  PMFS History: Patient Active Problem List   Diagnosis Date Noted   Rectal bleeding 01/30/2022   Abnormal finding on imaging 01/30/2022   GIB (gastrointestinal bleeding) 01/29/2022   Hyponatremia 01/29/2022   Hypocalcemia 01/29/2022   Alcohol abuse 01/29/2022   AKI (acute kidney injury) ([place]) 01/29/2022   Symptomatic anemia 01/29/2022   Venous stasis dermatitis of both lower extremities     Bleeding on Coumadin    Leg wound, left, subsequent encounter 12/30/2021   Compartment syndrome of left lower extremity ([place]) 09/22/2021   Status post surgery 09/22/2021   Compartment syndrome of left upper extremity ([place]) 09/22/2021   Hyperlipidemia 12/13/2020   Atrial fibrillation ([place]) 06/10/2020   Long term (current) use of anticoagulants 06/10/2020   Secondary hypercoagulable state ([place]) 04/24/2020   AV junctional bradycardia 03/22/2019   S/P minimally invasive mitral valve repair  03/21/2019   S/P Maze operation for atrial fibrillation 03/21/2019   Dilated aortic root ([place]) 01/04/2019   Dilated cardiomyopathy ([place]) 01/04/2019   Atrial flutter with rapid ventricular response ([place])    Mitral valve prolapse    Hypertensive urgency 12/30/2018   Acute on chronic systolic (congestive) heart failure ([place]) 40/98/1191   Acute systolic heart failure ([place]) 47/82/9562   Chronic systolic (congestive) heart failure (H[facility])    Persistent atrial fibrillation ([place]) 12/02/2018   Right hamstring muscle strain 07/20/2018   Essential hypertension 02/07/2016   Severe mitral regurgitation    Annual physical exam 11/16/2014   Arthritis of left knee 10/23/2014   Status post total left knee replacement 10/23/2014   Arthritis of knee, right 01/19/2014   Status post total knee replacement 01/19/2014   Benign paroxysmal positional vertigo 10/30/2013   Testicular hypofunction 03/24/2012   Past Medical History:  Diagnosis Date   Acute on chronic systolic (congestive) heart failure ([place]) 12/30/2018   Allergy    Arthritis    Atrial flutter with rapid ventricular response (H[facility])    Chronic systolic (congestive) heart failure ([place])    COVID 10/2021   mild   Dilated aortic root ([place]) 01/04/2019   Dilated cardiomyopathy ([place]) 01/04/2019   Dysrhythmia    Heart murmur    History of blood transfusion    01/27/22   History of colon polyps    Hypertension    Insomnia    Mitral valve prolapse    Mitral  valve regurgitation    Persistent atrial fibrillation ([place]) 12/02/2018   S/P Maze operation for atrial fibrillation 03/21/2019   Complete bilateral atrial lesion set using cryothermy and bipolar radiofrequency ablation with clipping of LA appendage via right mini thoracotomy approach   S/P minimally invasive mitral valve repair 03/21/2019   Complex valvuloplasty including triangular resection of posterior leaflet, artificial Gore-tex neochord placement x4 and 36 mm Sorin Memo 4D ring annuloplasty via right mini thoracotomy approach   Seizures ([place])    had one approx. 30 yrs ago,has not had any since; due to alchol and no sleep   Severe mitral regurgitation     Family History  Problem Relation Age of Onset   Hypertension Father  Colon cancer Father        dx in his late 57's   Diabetes Mother    Stomach cancer Neg Hx    Esophageal cancer Neg Hx    Pancreatic cancer Neg Hx    Rectal cancer Neg Hx     Past Surgical History:  Procedure Laterality Date   ANKLE FUSION  left   Dec. 9449   APPLICATION OF WOUND VAC Left 09/22/2021   Procedure: APPLICATION OF WOUND VAC;  Surgeon: Mcarthur Rossetti, MD;  Location: [place];  Service: Orthopedics;  Laterality: Left;   APPLICATION OF WOUND VAC Left 12/30/2021   Procedure: APPLICATION OF WOUND VAC;  Surgeon: Mcarthur Rossetti, MD;  Location: [place];  Service: Orthopedics;  Laterality: Left;   APPLICATION OF WOUND VAC Left 04/10/2022   Procedure: APPLICATION OF WOUND VAC;  Surgeon: Newt Minion, MD;  Location: [place];  Service: Orthopedics;  Laterality: Left;   ARTHROSCOPY KNEE W/ DRILLING  bilateral   2012   BIOPSY  01/30/2022   Procedure: BIOPSY;  Surgeon: Rush Landmark Telford Nab., MD;  Location: [facility];  Service: Gastroenterology;;   CARDIAC VALVE REPLACEMENT     CARDIOVERSION N/A 01/03/2019   Procedure: CARDIOVERSION;  Surgeon: Sueanne Margarita, MD;  [facility];  Service: Cardiovascular;  Laterality: N/A;    CARDIOVERSION N/A 06/23/2019   Procedure: CARDIOVERSION;  Surgeon: Fay Records, MD;  Location: [place];  Service: Cardiovascular;  Laterality: N/A;   CLIPPING OF ATRIAL APPENDAGE  03/21/2019   Procedure: Clipping Of Atrial Appendage using 66m Atricure Pro2 Clip;  Surgeon: ORexene Alberts MD;  [place]  Service: Open Heart Surgery;;   COLONOSCOPY     x2   COLONOSCOPY N/A 01/30/2022   Procedure: COLONOSCOPY;  Surgeon: MIrving Copas, MD;  [facility];  Service: Gastroenterology;  Laterality: N/A;   ESOPHAGOGASTRODUODENOSCOPY N/A 01/30/2022   Procedure: ESOPHAGOGASTRODUODENOSCOPY (EGD);  Surgeon: MIrving Copas, MD;  [facility];  Service: Gastroenterology;  Laterality: N/A;   FOOT ARTHRODESIS  06/23/2012   Procedure: ARTHRODESIS FOOT;  Surgeon: JWylene Simmer MD;  [place] [facility]  Service: Orthopedics;  Laterality: Left;  Left Subtalar and Talonavicular Joint Revision Arthrodesis  Aspiration of Bone Marrow from Left Hip    HAIR TRANSPLANT     HARDWARE REMOVAL  06/23/2012   Procedure: HARDWARE REMOVAL;  Surgeon: JWylene Simmer MD;  [place]  Service: Orthopedics;  Laterality: Left;  Removal of Deep Implant  X's 3   I & D EXTREMITY Left 09/23/2021   Procedure: IRRIGATION AND DEBRIDEMENT OF LEG;  Surgeon: BMcarthur Rossetti MD;  [place] [facility]  Service: Orthopedics;  Laterality: Left;   I & D EXTREMITY Left 09/26/2021   Procedure: REPEAT IRRIGATION AND DEBRIDEMENT LEFT LEG, POSSIBLE WOUND CLOSURE, POSSIBLE VAC CHANGE, POSSIBLE SKIN GRAFT;  Surgeon: BMcarthur Rossetti MD;  [place] [facility]  Service: Orthopedics;  Laterality: Left;   I & D EXTREMITY Left 12/30/2021   Procedure: IRRIGATION AND DEBRIDEMENT LEFT LOWER LEG WOUND;  Surgeon: BMcarthur Rossetti MD;  [place] [facility]  Service: Orthopedics;  Laterality: Left;   I & D EXTREMITY Left 01/02/2022   Procedure: LEFT LEG DEBRIDEMENT AND TISSUE GRAFT;  Surgeon: DNewt Minion  MD;  Location: [facility]  Service: Orthopedics;  Laterality: Left;   INCISION AND DRAINAGE WOUND WITH FASCIOTOMY Left 09/22/2021   Procedure: INCISION AND DRAINAGE WOUND WITH FASCIOTOMY;  Surgeon: BMcarthur Rossetti MD;  [place]  Service: Orthopedics;  Laterality: Left;   INSERTION OF MESH N/A 07/10/2016   Procedure: INSERTION OF MESH;  Surgeon: Coralie Keens, MD;  Location: [place];  Service: General;  Laterality: N/A;   JOINT REPLACEMENT     right hip  01-2011   LEFT HEART CATH AND CORONARY ANGIOGRAPHY N/A 03/16/2019   Procedure: LEFT HEART CATH AND CORONARY ANGIOGRAPHY;  Surgeon: Sherren Mocha, MD;  Location: [place] [facility];  Service: Cardiovascular;  Laterality: N/A;   LIMB SPARING RESECTION HIP W/ SADDLE JOINT REPLACEMENT Right    MINIMALLY INVASIVE MAZE PROCEDURE N/A 03/21/2019   Procedure: MINIMALLY INVASIVE MAZE PROCEDURE;  Surgeon: Rexene Alberts, MD;  Location: [place];  Service: Open Heart Surgery;  Laterality: N/A;   MITRAL VALVE REPAIR Right 03/21/2019   Procedure: MINIMALLY INVASIVE MITRAL VALVE REPAIR (MVR) using Memo 4D ring size 36;  Surgeon: Rexene Alberts, MD;  Location: [place];  Service: Open Heart Surgery;  Laterality: Right;   POLYPECTOMY  01/30/2022   Procedure: POLYPECTOMY;  Surgeon: Mansouraty, Telford Nab., MD;  Location: [facility];  Service: Gastroenterology;;   SKIN SPLIT GRAFT Left 04/10/2022   Procedure: APPLY SKIN GRAFT TO LEFT LEG WOUND;  Surgeon: Newt Minion, MD;  Location: [name];  Service: Orthopedics;  Laterality: Left;   TEE WITHOUT CARDIOVERSION N/A 01/03/2019   Procedure: TRANSESOPHAGEAL ECHOCARDIOGRAM (TEE);  Surgeon: Sueanne Margarita, MD;  Location[facility];  Service: Cardiovascular;  Laterality: N/A;   TEE WITHOUT CARDIOVERSION N/A 03/21/2019   Procedure: TRANSESOPHAGEAL ECHOCARDIOGRAM (TEE);  Surgeon: Rexene Alberts, MD;  Location: [place];  Service: Open Heart Surgery;  Laterality: N/A;   TEMPORARY PACEMAKER N/A  03/22/2019   Procedure: TEMPORARY PACEMAKER;  Surgeon: Belva Crome, MD;  Location: [place] [facility];  Service: Cardiovascular;  Laterality: N/A;   TOTAL KNEE ARTHROPLASTY Right 01/19/2014   Procedure: RIGHT TOTAL KNEE ARTHROPLASTY, Steroid injection left knee;  Surgeon: Mcarthur Rossetti, MD;  [facility];  Service: Orthopedics;  Laterality: Right;   TOTAL KNEE ARTHROPLASTY Left 10/23/2014   Procedure: LEFT TOTAL KNEE ARTHROPLASTY;  Surgeon: Mcarthur Rossetti, MD;  [facility];  Service: Orthopedics;  Laterality: Left;   UMBILICAL HERNIA REPAIR N/A 07/10/2016   Procedure: UMBILICAL HERNIA REPAIR;  Surgeon: Coralie Keens, MD;  Location: [place];  Service: General;  Laterality: N/A;   Social History   Occupational History   Occupation: Pension scheme manager  Tobacco Use   Smoking status: Never   Smokeless tobacco: Never  Vaping Use   Vaping Use: Never used  Substance and Sexual Activity   Alcohol use: Not Currently    Alcohol/week: 21.0 standard drinks of alcohol    Types: 21 Shots of liquor per week   Drug use: No   Sexual activity: Yes

## 2022-07-21 ENCOUNTER — Other Ambulatory Visit: Payer: Self-pay | Admitting: Family

## 2022-07-21 MED ORDER — HYDROCODONE-ACETAMINOPHEN 5-325 MG PO TABS: 1.0000 | ORAL_TABLET | Freq: Two times a day (BID) | ORAL | 0 refills | Status: DC | PRN

## 2022-07-24 ENCOUNTER — Other Ambulatory Visit: Payer: Self-pay | Admitting: Cardiovascular Disease

## 2022-07-24 MED ORDER — LOSARTAN POTASSIUM 50 MG PO TABS: 50.0000 mg | ORAL_TABLET | Freq: Every day | ORAL | 0 refills | Status: DC

## 2022-07-30 ENCOUNTER — Ambulatory Visit: Payer: Medicare HMO | Admitting: Orthopedic Surgery

## 2022-07-30 DIAGNOSIS — S81802D Unspecified open wound, left lower leg, subsequent encounter: Secondary | ICD-10-CM

## 2022-07-31 ENCOUNTER — Other Ambulatory Visit: Payer: Self-pay | Admitting: Family

## 2022-07-31 ENCOUNTER — Other Ambulatory Visit (HOSPITAL_COMMUNITY): Payer: Medicare HMO

## 2022-07-31 MED ORDER — HYDROCODONE-ACETAMINOPHEN 5-325 MG PO TABS: 1.0000 | ORAL_TABLET | Freq: Two times a day (BID) | ORAL | 0 refills | Status: DC | PRN

## 2022-08-05 ENCOUNTER — Encounter: Payer: Self-pay | Admitting: Family

## 2022-08-05 ENCOUNTER — Ambulatory Visit: Payer: Medicare HMO | Admitting: Family

## 2022-08-05 DIAGNOSIS — S81802D Unspecified open wound, left lower leg, subsequent encounter: Secondary | ICD-10-CM | POA: Diagnosis not present

## 2022-08-05 DIAGNOSIS — T79A22D Traumatic compartment syndrome of left lower extremity, subsequent encounter: Secondary | ICD-10-CM

## 2022-08-05 NOTE — Progress Notes (Signed)
Post-Op Visit Note   Patient: [name]           Date of Birth: 01-02-1956           MRN: [id number] Visit Date: 08/05/2022 PCP: [name], MD  Chief Complaint:  Chief Complaint  Patient presents with   Left Leg - Follow-up    04/10/2022 surgery kerecis application In office application 02/09/80 and 07/30/22    HPI:  HPI The patient is a 66 year old gentleman who is a in follow-up for ulceration to his left lower extremity.  Last had in office application of Kerecis on 928 Ortho Exam On examination of the left lower leg there is a wound which is currently measuring 10-1/2 cm wide by 8.2 cm long. his is filled in with the 100% bleeding granulation after debridement.  There is no surrounding erythema.  There is circumferential epithelialization.  There is no maceration or epiboly.  The wound healing has stalled, the wound bed has healthy granulation tissue, and patient presents for evaluation and application of [place] Micro graft. After informed consent a 10 blade knife was used to debride the skin and soft tissue to healthy viable bleeding granulation tissue.  Silver nitrate was used for hemostasis. The wound measures: 8.2 cm in length, 10.5 cm  in width, 1 mm in depth, wound location left lower leg. Kerecis MariGen micro tissue graft 10 cm2 was applied, and there was no wastage.  Please see the photo below of the Lot number and expiration date. The micro tissue graft was covered with a nonadherent Adaptic dressing, bolstered with 4 x 4 gauze and secured with a compression wrap.      Visit Diagnoses:  1. Compartment syndrome of left lower extremity, subsequent encounter   2. Leg wound, left, subsequent encounter     Plan: The patient will change the outer gauze and Ace wrap daily.  Leaving the graft material and the Adaptic in place.  He will follow-up in 1 week for reevaluation.  Follow-Up Instructions: Return in about 1 week (around 08/12/2022).    Imaging: No results found.  Orders:  No orders of the defined types were placed in this encounter.  No orders of the defined types were placed in this encounter.    PMFS History: Patient Active Problem List   Diagnosis Date Noted   Rectal bleeding 01/30/2022   Abnormal finding on imaging 01/30/2022   GIB (gastrointestinal bleeding) 01/29/2022   Hyponatremia 01/29/2022   Hypocalcemia 01/29/2022   Alcohol abuse 01/29/2022   AKI (acute kidney injury) ([place]) 01/29/2022   Symptomatic anemia 01/29/2022   Venous stasis dermatitis of both lower extremities    Bleeding on Coumadin    Leg wound, left, subsequent encounter 12/30/2021   Compartment syndrome of left lower extremity ([place]) 09/22/2021   Status post surgery 09/22/2021   Compartment syndrome of left upper extremity ([place]) 09/22/2021   Hyperlipidemia 12/13/2020   Atrial fibrillation ([place]) 06/10/2020   Long term (current) use of anticoagulants 06/10/2020   Secondary hypercoagulable state ([place]) 04/24/2020   AV junctional bradycardia 03/22/2019   S/P minimally invasive mitral valve repair  03/21/2019   S/P Maze operation for atrial fibrillation 03/21/2019   Dilated aortic root ([place]) 01/04/2019   Dilated cardiomyopathy ([place]) 01/04/2019   Atrial flutter with rapid ventricular response (HCC)    Mitral valve prolapse    Hypertensive urgency 12/30/2018   Acute on chronic systolic (congestive) heart failure ([place]) 19/14/7829   Acute systolic heart failure ([place]) 12/30/2018  Chronic systolic (congestive) heart failure (HCC)    Persistent atrial fibrillation ([place]) 12/02/2018   Right hamstring muscle strain 07/20/2018   Essential hypertension 02/07/2016   Severe mitral regurgitation    Annual physical exam 11/16/2014   Arthritis of left knee 10/23/2014   Status post total left knee replacement 10/23/2014   Arthritis of knee, right 01/19/2014   Status post total knee replacement 01/19/2014   Benign paroxysmal positional vertigo  10/30/2013   Testicular hypofunction 03/24/2012   Past Medical History:  Diagnosis Date   Acute on chronic systolic (congestive) heart failure ([place]) 12/30/2018   Allergy    Arthritis    Atrial flutter with rapid ventricular response (HCC)    Chronic systolic (congestive) heart failure ([place])    COVID 10/2021   mild   Dilated aortic root ([place]) 01/04/2019   Dilated cardiomyopathy ([place]) 01/04/2019   Dysrhythmia    Heart murmur    History of blood transfusion    01/27/22   History of colon polyps    Hypertension    Insomnia    Mitral valve prolapse    Mitral valve regurgitation    Persistent atrial fibrillation ([place]) 12/02/2018   S/P Maze operation for atrial fibrillation 03/21/2019   Complete bilateral atrial lesion set using cryothermy and bipolar radiofrequency ablation with clipping of LA appendage via right mini thoracotomy approach   S/P minimally invasive mitral valve repair 03/21/2019   Complex valvuloplasty including triangular resection of posterior leaflet, artificial Gore-tex neochord placement x4 and 36 mm Sorin Memo 4D ring annuloplasty via right mini thoracotomy approach   Seizures ([place])    had one approx. 30 yrs ago,has not had any since; due to alchol and no sleep   Severe mitral regurgitation     Family History  Problem Relation Age of Onset   Hypertension Father    Colon cancer Father        dx in his late 55's   Diabetes Mother    Stomach cancer Neg Hx    Esophageal cancer Neg Hx    Pancreatic cancer Neg Hx    Rectal cancer Neg Hx     Past Surgical History:  Procedure Laterality Date   ANKLE FUSION  left   Dec. 0254   APPLICATION OF WOUND VAC Left 09/22/2021   Procedure: APPLICATION OF WOUND VAC;  Surgeon: Mcarthur Rossetti, MD;  Location: [facility];  Service: Orthopedics;  Laterality: Left;   APPLICATION OF WOUND VAC Left 12/30/2021   Procedure: APPLICATION OF WOUND VAC;  Surgeon: Mcarthur Rossetti, MD;  Location: [facility];  Service: Orthopedics;   Laterality: Left;   APPLICATION OF WOUND VAC Left 04/10/2022   Procedure: APPLICATION OF WOUND VAC;  Surgeon: Newt Minion, MD;  Location: [facility];  Service: Orthopedics;  Laterality: Left;   ARTHROSCOPY KNEE W/ DRILLING  bilateral   2012   BIOPSY  01/30/2022   Procedure: BIOPSY;  Surgeon: Rush Landmark Telford Nab., MD;  Location: [facility];  Service: Gastroenterology;;   CARDIAC VALVE REPLACEMENT     CARDIOVERSION N/A 01/03/2019   Procedure: CARDIOVERSION;  Surgeon: Sueanne Margarita, MD;  [facility];  Service: Cardiovascular;  Laterality: N/A;   CARDIOVERSION N/A 06/23/2019   Procedure: CARDIOVERSION;  Surgeon: Fay Records, MD;  Location: [facility]COPY;  Service: Cardiovascular;  Laterality: N/A;   CLIPPING OF ATRIAL APPENDAGE  03/21/2019   Procedure: Clipping Of Atrial Appendage using 45m Atricure Pro2 Clip;  Surgeon: ORexene Alberts MD;  Location[place]  Service: Open Heart Surgery;;   COLONOSCOPY     x2   COLONOSCOPY N/A 01/30/2022   Procedure: COLONOSCOPY;  Surgeon: Mansouraty, Telford Nab., MD;  Location: [facility];  Service: Gastroenterology;  Laterality: N/A;   ESOPHAGOGASTRODUODENOSCOPY N/A 01/30/2022   Procedure: ESOPHAGOGASTRODUODENOSCOPY (EGD);  Surgeon: Irving Copas., MD;  Location: [facility];  Service: Gastroenterology;  Laterality: N/A;   FOOT ARTHRODESIS  06/23/2012   Procedure: ARTHRODESIS FOOT;  Surgeon: Wylene Simmer, MD;  Location: [place];  Service: Orthopedics;  Laterality: Left;  Left Subtalar and Talonavicular Joint Revision Arthrodesis  Aspiration of Bone Marrow from Left Hip    HAIR TRANSPLANT     HARDWARE REMOVAL  06/23/2012   Procedure: HARDWARE REMOVAL;  Surgeon: Wylene Simmer, MD;  Location: [place];  Service: Orthopedics;  Laterality: Left;  Removal of Deep Implant  X's 3   I & D EXTREMITY Left 09/23/2021   Procedure: IRRIGATION AND DEBRIDEMENT OF LEG;  Surgeon: Mcarthur Rossetti, MD;  Location: [place];  Service: Orthopedics;  Laterality:  Left;   I & D EXTREMITY Left 09/26/2021   Procedure: REPEAT IRRIGATION AND DEBRIDEMENT LEFT LEG, POSSIBLE WOUND CLOSURE, POSSIBLE VAC CHANGE, POSSIBLE SKIN GRAFT;  Surgeon: Mcarthur Rossetti, MD;  Location: [place];  Service: Orthopedics;  Laterality: Left;   I & D EXTREMITY Left 12/30/2021   Procedure: IRRIGATION AND DEBRIDEMENT LEFT LOWER LEG WOUND;  Surgeon: Mcarthur Rossetti, MD;  Location: [place];  Service: Orthopedics;  Laterality: Left;   I & D EXTREMITY Left 01/02/2022   Procedure: LEFT LEG DEBRIDEMENT AND TISSUE GRAFT;  Surgeon: Newt Minion, MD;  Location: [place];  Service: Orthopedics;  Laterality: Left;   INCISION AND DRAINAGE WOUND WITH FASCIOTOMY Left 09/22/2021   Procedure: INCISION AND DRAINAGE WOUND WITH FASCIOTOMY;  Surgeon: Mcarthur Rossetti, MD;  Location: [place];  Service: Orthopedics;  Laterality: Left;   INSERTION OF MESH N/A 07/10/2016   Procedure: INSERTION OF MESH;  Surgeon: Coralie Keens, MD;  Location: [facility];  Service: General;  Laterality: N/A;   JOINT REPLACEMENT     right hip  01-2011   LEFT HEART CATH AND CORONARY ANGIOGRAPHY N/A 03/16/2019   Procedure: LEFT HEART CATH AND CORONARY ANGIOGRAPHY;  Surgeon: Sherren Mocha, MD;  Location: [facility] [facility] CV [facility];  Service: Cardiovascular;  Laterality: N/A;   LIMB SPARING RESECTION HIP W/ SADDLE JOINT REPLACEMENT Right    MINIMALLY INVASIVE MAZE PROCEDURE N/A 03/21/2019   Procedure: MINIMALLY INVASIVE MAZE PROCEDURE;  Surgeon: Rexene Alberts, MD;  Location: [facility] [place];  Service: Open Heart Surgery;  Laterality: N/A;   MITRAL VALVE REPAIR Right 03/21/2019   Procedure: MINIMALLY INVASIVE MITRAL VALVE REPAIR (MVR) using Memo 4D ring size 36;  Surgeon: Rexene Alberts, MD;  Location: [place];  Service: Open Heart Surgery;  Laterality: Right;   POLYPECTOMY  01/30/2022   Procedure: POLYPECTOMY;  Surgeon: Mansouraty, Telford Nab., MD;  Location: [facility];  Service: Gastroenterology;;   SKIN SPLIT GRAFT  Left 04/10/2022   Procedure: APPLY SKIN GRAFT TO LEFT LEG WOUND;  Surgeon: Newt Minion, MD;  Location: [place];  Service: Orthopedics;  Laterality: Left;   TEE WITHOUT CARDIOVERSION N/A 01/03/2019   Procedure: TRANSESOPHAGEAL ECHOCARDIOGRAM (TEE);  Surgeon: Sueanne Margarita, MD;  Location: [facility] ENDOSCOPY;  Service: Cardiovascular;  Laterality: N/A;   TEE WITHOUT CARDIOVERSION N/A 03/21/2019   Procedure: TRANSESOPHAGEAL ECHOCARDIOGRAM (TEE);  Surgeon: Rexene Alberts, MD;  Location: [place];  Service: Open Heart Surgery;  Laterality: N/A;   TEMPORARY PACEMAKER N/A 03/22/2019   Procedure: TEMPORARY PACEMAKER;  Surgeon: Belva Crome, MD;  Location: [place] [facility];  Service: Cardiovascular;  Laterality: N/A;   TOTAL KNEE ARTHROPLASTY Right 01/19/2014   Procedure: RIGHT TOTAL KNEE ARTHROPLASTY, Steroid injection left knee;  Surgeon: Mcarthur Rossetti, MD;  Location: [facility];  Service: Orthopedics;  Laterality: Right;   TOTAL KNEE ARTHROPLASTY Left 10/23/2014   Procedure: LEFT TOTAL KNEE ARTHROPLASTY;  Surgeon: Mcarthur Rossetti, MD;  Location: [facility];  Service: Orthopedics;  Laterality: Left;   UMBILICAL HERNIA REPAIR N/A 07/10/2016   Procedure: UMBILICAL HERNIA REPAIR;  Surgeon: Coralie Keens, MD;  Location: [place];  Service: General;  Laterality: N/A;   Social History   Occupational History   Occupation: Pension scheme manager  Tobacco Use   Smoking status: Never   Smokeless tobacco: Never  Vaping Use   Vaping Use: Never used  Substance and Sexual Activity   Alcohol use: Not Currently    Alcohol/week: 21.0 standard drinks of alcohol    Types: 21 Shots of liquor per week   Drug use: No   Sexual activity: Yes

## 2022-08-07 ENCOUNTER — Ambulatory Visit (HOSPITAL_COMMUNITY): Payer: Medicare HMO | Attending: Cardiology

## 2022-08-07 DIAGNOSIS — I5022 Chronic systolic (congestive) heart failure: Secondary | ICD-10-CM | POA: Diagnosis not present

## 2022-08-07 DIAGNOSIS — I10 Essential (primary) hypertension: Secondary | ICD-10-CM

## 2022-08-11 ENCOUNTER — Ambulatory Visit: Payer: Medicare HMO

## 2022-08-11 ENCOUNTER — Encounter: Payer: Self-pay | Admitting: Orthopedic Surgery

## 2022-08-11 LAB — ECHOCARDIOGRAM COMPLETE
Area-P 1/2: 3.11 cm2
S' Lateral: 4.7 cm

## 2022-08-11 NOTE — Progress Notes (Signed)
Office Visit Note   Patient: [name]           Date of Birth: April 13, 1956           MRN: [id number] Visit Date: 07/30/2022              Requested by: [name], MD [address] [place],  [place] [id number] PCP: [name], MD  Chief Complaint  Patient presents with   Left Leg - Follow-up    4/0/3474 LLE application skin graft on OR 2/59/5638 in office application Kerecis graft      HPI: Patient is a 66 year old gentleman who presents in follow-up status post Kerecis tissue graft to the left lower extremity.  Patient most recently had an in office application on 7/56/4332.  Assessment & Plan: Visit Diagnoses:  1. Leg wound, left, subsequent encounter     Plan: Kerecis micro powder reapplied patient is quite pleased with the improvement in the wound healing.  Follow-Up Instructions: Return in about 1 week (around 08/06/2022).   Ortho Exam  Patient is alert, oriented, no adenopathy, well-dressed, normal affect, normal respiratory effort. Examination the wound continues to decrease in size the leg swelling has decreased there is decreased brawny edema.  There is healthy granulation tissue in the wound bed.  The wound healing has stalled, the wound bed has healthy granulation tissue, and patient presents for evaluation and application of Acton Micro graft. After informed consent a 10 blade knife was used to debride the skin and soft tissue to healthy viable bleeding granulation tissue.  Silver nitrate was used for hemostasis. The wound measures: 10 cm in length, 9cm  in width, 3 mm in depth, wound location left leg Kerecis MariGen micro tissue graft 38 cm2 was applied, and there was no wastage.  Please see the photo below of the Lot number and expiration date. The micro tissue graft was covered with a nonadherent Adaptic dressing, bolstered with 4 x 4 gauze and secured with a compression wrap.     Imaging: No results  found.     Labs: Lab Results  Component Value Date   HGBA1C 5.4 01/29/2022   HGBA1C 5.2 03/16/2019   REPTSTATUS 01/07/2022 FINAL 01/02/2022   REPTSTATUS 01/08/2022 FINAL 01/02/2022   GRAMSTAIN  01/02/2022    FEW WBC PRESENT,BOTH PMN AND MONONUCLEAR RARE GRAM POSITIVE COCCI RARE GRAM NEGATIVE RODS Performed at [facility], [address] [place], [place] [address]    GRAMSTAIN  01/02/2022    FEW WBC PRESENT,BOTH PMN AND MONONUCLEAR NO ORGANISMS SEEN    CULT  01/02/2022    FEW STREPTOCOCCUS GROUP C Beta hemolytic streptococci are predictably susceptible to penicillin and other beta lactams. Susceptibility testing not routinely performed. ABUNDANT KLEBSIELLA OXYTOCA ABUNDANT STENOTROPHOMONAS MALTOPHILIA    CULT  01/02/2022    NO ANAEROBES ISOLATED Performed at [facility], [place] [address] [place], [place] [address]    [place] OXYTOCA 01/02/2022   LABORGA STENOTROPHOMONAS MALTOPHILIA 01/02/2022     Lab Results  Component Value Date   ALBUMIN 2.5 (L) 01/30/2022   ALBUMIN 3.1 (L) 01/29/2022   ALBUMIN 4.1 09/22/2021    Lab Results  Component Value Date   MG 2.1 01/29/2022   MG 2.1 06/19/2019   MG 2.2 03/25/2019   No results found for: "VD25OH"  No results found for: "PREALBUMIN"    Latest Ref Rng & Units 04/10/2022    8:06 AM 03/18/2022   11:08 AM 02/04/2022  8:39 AM  CBC EXTENDED  WBC 4.0 - 10.5 K/uL 6.0  6.5  8.0   RBC 4.22 - 5.81 MIL/uL 4.02  3.80  3.02   Hemoglobin 13.0 - 17.0 g/dL 9.7  9.1 Repeated and verified X2.  8.4 Repeated and verified X2.   HCT 39.0 - 52.0 % 34.4  30.0  25.8 Repeated and verified X2.   Platelets 150 - 400 K/uL 242  315.0  270.0   NEUT# 1.4 - 7.7 K/uL  4.8    Lymph# 0.7 - 4.0 K/uL  0.9       There is no height or weight on file to calculate BMI.  Orders:  No orders of the defined types were placed in this encounter.  No orders of the defined types were placed in this encounter.    Procedures: No  procedures performed  Clinical Data: No additional findings.  ROS:  All other systems negative, except as noted in the HPI. Review of Systems  Objective: Vital Signs: There were no vitals taken for this visit.  Specialty Comments:  No specialty comments available.  PMFS History: Patient Active Problem List   Diagnosis Date Noted   Rectal bleeding 01/30/2022   Abnormal finding on imaging 01/30/2022   GIB (gastrointestinal bleeding) 01/29/2022   Hyponatremia 01/29/2022   Hypocalcemia 01/29/2022   Alcohol abuse 01/29/2022   AKI (acute kidney injury) ([place]) 01/29/2022   Symptomatic anemia 01/29/2022   Venous stasis dermatitis of both lower extremities    Bleeding on Coumadin    Leg wound, left, subsequent encounter 12/30/2021   Compartment syndrome of left lower extremity ([place]) 09/22/2021   Status post surgery 09/22/2021   Compartment syndrome of left upper extremity ([place]) 09/22/2021   Hyperlipidemia 12/13/2020   Atrial fibrillation ([place]) 06/10/2020   Long term (current) use of anticoagulants 06/10/2020   Secondary hypercoagulable state ([place]) 04/24/2020   AV junctional bradycardia 03/22/2019   S/P minimally invasive mitral valve repair  03/21/2019   S/P Maze operation for atrial fibrillation 03/21/2019   Dilated aortic root ([place]) 01/04/2019   Dilated cardiomyopathy ([place]) 01/04/2019   Atrial flutter with rapid ventricular response ([place])    Mitral valve prolapse    Hypertensive urgency 12/30/2018   Acute on chronic systolic (congestive) heart failure ([place]) 54/27/0623   Acute systolic heart failure ([place]) 76/28/3151   Chronic systolic (congestive) heart failure ([facility])    Persistent atrial fibrillation ([place]) 12/02/2018   Right hamstring muscle strain 07/20/2018   Essential hypertension 02/07/2016   Severe mitral regurgitation    Annual physical exam 11/16/2014   Arthritis of left knee 10/23/2014   Status post total left knee replacement 10/23/2014   Arthritis of knee,  right 01/19/2014   Status post total knee replacement 01/19/2014   Benign paroxysmal positional vertigo 10/30/2013   Testicular hypofunction 03/24/2012   Past Medical History:  Diagnosis Date   Acute on chronic systolic (congestive) heart failure ([place]) 12/30/2018   Allergy    Arthritis    Atrial flutter with rapid ventricular response (H[facility])    Chronic systolic (congestive) heart failure ([place])    COVID 10/2021   mild   Dilated aortic root ([place]) 01/04/2019   Dilated cardiomyopathy ([place]) 01/04/2019   Dysrhythmia    Heart murmur    History of blood transfusion    01/27/22   History of colon polyps    Hypertension    Insomnia    Mitral valve prolapse    Mitral valve regurgitation    Persistent atrial fibrillation ([place])  12/02/2018   S/P Maze operation for atrial fibrillation 03/21/2019   Complete bilateral atrial lesion set using cryothermy and bipolar radiofrequency ablation with clipping of LA appendage via right mini thoracotomy approach   S/P minimally invasive mitral valve repair 03/21/2019   Complex valvuloplasty including triangular resection of posterior leaflet, artificial Gore-tex neochord placement x4 and 36 mm Sorin Memo 4D ring annuloplasty via right mini thoracotomy approach   Seizures ([place])    had one approx. 30 yrs ago,has not had any since; due to alchol and no sleep   Severe mitral regurgitation     Family History  Problem Relation Age of Onset   Hypertension Father    Colon cancer Father        dx in his late 67's   Diabetes Mother    Stomach cancer Neg Hx    Esophageal cancer Neg Hx    Pancreatic cancer Neg Hx    Rectal cancer Neg Hx     Past Surgical History:  Procedure Laterality Date   ANKLE FUSION  left   Dec. 7510   APPLICATION OF WOUND VAC Left 09/22/2021   Procedure: APPLICATION OF WOUND VAC;  Surgeon: Mcarthur Rossetti, MD;  Location: [facility];  Service: Orthopedics;  Laterality: Left;   APPLICATION OF WOUND VAC Left 12/30/2021   Procedure:  APPLICATION OF WOUND VAC;  Surgeon: Mcarthur Rossetti, MD;  Location: [facility];  Service: Orthopedics;  Laterality: Left;   APPLICATION OF WOUND VAC Left 04/10/2022   Procedure: APPLICATION OF WOUND VAC;  Surgeon: Newt Minion, MD;  Location: [place];  Service: Orthopedics;  Laterality: Left;   ARTHROSCOPY KNEE W/ DRILLING  bilateral   2012   BIOPSY  01/30/2022   Procedure: BIOPSY;  Surgeon: Rush Landmark Telford Nab., MD;  Location: [facility];  Service: Gastroenterology;;   CARDIAC VALVE REPLACEMENT     CARDIOVERSION N/A 01/03/2019   Procedure: CARDIOVERSION;  Surgeon: Sueanne Margarita, MD;  Location[facility];  Service: Cardiovascular;  Laterality: N/A;   CARDIOVERSION N/A 06/23/2019   Procedure: CARDIOVERSION;  Surgeon: Fay Records, MD;  Location: [place];  Service: Cardiovascular;  Laterality: N/A;   CLIPPING OF ATRIAL APPENDAGE  03/21/2019   Procedure: Clipping Of Atrial Appendage using 66m Atricure Pro2 Clip;  Surgeon: ORexene Alberts MD;  [place]: [place]  Service: Open Heart Surgery;;   COLONOSCOPY     x2   COLONOSCOPY N/A 01/30/2022   Procedure: COLONOSCOPY;  Surgeon: MIrving Copas, MD;  [facility];  Service: Gastroenterology;  Laterality: N/A;   ESOPHAGOGASTRODUODENOSCOPY N/A 01/30/2022   Procedure: ESOPHAGOGASTRODUODENOSCOPY (EGD);  Surgeon: MIrving Copas, MD;  [facility];  Service: Gastroenterology;  Laterality: N/A;   FOOT ARTHRODESIS  06/23/2012   Procedure: ARTHRODESIS FOOT;  Surgeon: JWylene Simmer MD;  [place] [place]  Service: Orthopedics;  Laterality: Left;  Left Subtalar and Talonavicular Joint Revision Arthrodesis  Aspiration of Bone Marrow from Left Hip    HAIR TRANSPLANT     HARDWARE REMOVAL  06/23/2012   Procedure: HARDWARE REMOVAL;  Surgeon: JWylene Simmer MD;  Location: [place]  Service: Orthopedics;  Laterality: Left;  Removal of Deep Implant  X's 3   I & D EXTREMITY Left 09/23/2021   Procedure: IRRIGATION AND  DEBRIDEMENT OF LEG;  Surgeon: BMcarthur Rossetti MD;  [place]  Service: Orthopedics;  Laterality: Left;   I & D EXTREMITY Left 09/26/2021   Procedure: REPEAT IRRIGATION AND DEBRIDEMENT LEFT LEG, POSSIBLE WOUND CLOSURE, POSSIBLE  VAC CHANGE, POSSIBLE SKIN GRAFT;  Surgeon: Mcarthur Rossetti, MD;  Location: [place];  Service: Orthopedics;  Laterality: Left;   I & D EXTREMITY Left 12/30/2021   Procedure: IRRIGATION AND DEBRIDEMENT LEFT LOWER LEG WOUND;  Surgeon: Mcarthur Rossetti, MD;  Location: [place];  Service: Orthopedics;  Laterality: Left;   I & D EXTREMITY Left 01/02/2022   Procedure: LEFT LEG DEBRIDEMENT AND TISSUE GRAFT;  Surgeon: Newt Minion, MD;  Location: [facility] [place];  Service: Orthopedics;  Laterality: Left;   INCISION AND DRAINAGE WOUND WITH FASCIOTOMY Left 09/22/2021   Procedure: INCISION AND DRAINAGE WOUND WITH FASCIOTOMY;  Surgeon: Mcarthur Rossetti, MD;  Location: [place];  Service: Orthopedics;  Laterality: Left;   INSERTION OF MESH N/A 07/10/2016   Procedure: INSERTION OF MESH;  Surgeon: Coralie Keens, MD;  Location: [place];  Service: General;  Laterality: N/A;   JOINT REPLACEMENT     right hip  01-2011   LEFT HEART CATH AND CORONARY ANGIOGRAPHY N/A 03/16/2019   Procedure: LEFT HEART CATH AND CORONARY ANGIOGRAPHY;  Surgeon: Sherren Mocha, MD;  Location: [facility];  Service: Cardiovascular;  Laterality: N/A;   LIMB SPARING RESECTION HIP W/ SADDLE JOINT REPLACEMENT Right    MINIMALLY INVASIVE MAZE PROCEDURE N/A 03/21/2019   Procedure: MINIMALLY INVASIVE MAZE PROCEDURE;  Surgeon: Rexene Alberts, MD;  Location: [place];  Service: Open Heart Surgery;  Laterality: N/A;   MITRAL VALVE REPAIR Right 03/21/2019   Procedure: MINIMALLY INVASIVE MITRAL VALVE REPAIR (MVR) using Memo 4D ring size 36;  Surgeon: Rexene Alberts, MD;  Location: [place];  Service: Open Heart Surgery;  Laterality: Right;   POLYPECTOMY  01/30/2022   Procedure: POLYPECTOMY;   Surgeon: Mansouraty, Telford Nab., MD;  Location: [facility];  Service: Gastroenterology;;   SKIN SPLIT GRAFT Left 04/10/2022   Procedure: APPLY SKIN GRAFT TO LEFT LEG WOUND;  Surgeon: Newt Minion, MD;  Location: [place];  Service: Orthopedics;  Laterality: Left;   TEE WITHOUT CARDIOVERSION N/A 01/03/2019   Procedure: TRANSESOPHAGEAL ECHOCARDIOGRAM (TEE);  Surgeon: Sueanne Margarita, MD;  Location[facility];  Service: Cardiovascular;  Laterality: N/A;   TEE WITHOUT CARDIOVERSION N/A 03/21/2019   Procedure: TRANSESOPHAGEAL ECHOCARDIOGRAM (TEE);  Surgeon: Rexene Alberts, MD;  Location: [place];  Service: Open Heart Surgery;  Laterality: N/A;   TEMPORARY PACEMAKER N/A 03/22/2019   Procedure: TEMPORARY PACEMAKER;  Surgeon: Belva Crome, MD;  Location: [place] [facility];  Service: Cardiovascular;  Laterality: N/A;   TOTAL KNEE ARTHROPLASTY Right 01/19/2014   Procedure: RIGHT TOTAL KNEE ARTHROPLASTY, Steroid injection left knee;  Surgeon: Mcarthur Rossetti, MD;  [facility];  Service: Orthopedics;  Laterality: Right;   TOTAL KNEE ARTHROPLASTY Left 10/23/2014   Procedure: LEFT TOTAL KNEE ARTHROPLASTY;  Surgeon: Mcarthur Rossetti, MD;  [facility]: [facility];  Service: Orthopedics;  Laterality: Left;   UMBILICAL HERNIA REPAIR N/A 07/10/2016   Procedure: UMBILICAL HERNIA REPAIR;  Surgeon: Coralie Keens, MD;  Location: [place];  Service: General;  Laterality: N/A;   Social History   Occupational History   Occupation: Pension scheme manager  Tobacco Use   Smoking status: Never   Smokeless tobacco: Never  Vaping Use   Vaping Use: Never used  Substance and Sexual Activity   Alcohol use: Not Currently    Alcohol/week: 21.0 standard drinks of alcohol    Types: 21 Shots of liquor per week   Drug use: No   Sexual activity: Yes

## 2022-08-12 ENCOUNTER — Ambulatory Visit: Payer: Medicare HMO | Admitting: Family

## 2022-08-12 DIAGNOSIS — S81802D Unspecified open wound, left lower leg, subsequent encounter: Secondary | ICD-10-CM

## 2022-08-13 ENCOUNTER — Ambulatory Visit: Payer: Medicare HMO | Admitting: Orthopedic Surgery

## 2022-08-18 ENCOUNTER — Ambulatory Visit: Payer: Medicare HMO | Attending: Cardiovascular Disease

## 2022-08-18 DIAGNOSIS — I4819 Other persistent atrial fibrillation: Secondary | ICD-10-CM | POA: Diagnosis not present

## 2022-08-18 DIAGNOSIS — Z5181 Encounter for therapeutic drug level monitoring: Secondary | ICD-10-CM

## 2022-08-18 DIAGNOSIS — I4892 Unspecified atrial flutter: Secondary | ICD-10-CM | POA: Diagnosis not present

## 2022-08-18 LAB — POCT INR: INR: 1.6 — AB (ref 2.0–3.0)

## 2022-08-18 NOTE — Patient Instructions (Signed)
TAKE 2 TABLETS TODAY and then Continue taking 1 tablet daily except 1.5 tablets on Wednesdays.  [phone]. INR 8 weeks

## 2022-08-26 ENCOUNTER — Ambulatory Visit: Payer: Medicare HMO | Admitting: Family

## 2022-08-26 ENCOUNTER — Encounter: Payer: Self-pay | Admitting: Family

## 2022-08-26 DIAGNOSIS — T79A22D Traumatic compartment syndrome of left lower extremity, subsequent encounter: Secondary | ICD-10-CM

## 2022-08-26 NOTE — Progress Notes (Signed)
Post-Op Visit Note   Patient: [name]           Date of Birth: Jul 10, 1956           MRN: [id number] Visit Date: 08/12/2022 PCP: [name], MD  Chief Complaint:  Chief Complaint  Patient presents with   Left Leg - Follow-up    12/10/3660 surgical application kerecis graft 06/30/22, 07/30/22 and 94/05/6545 in office application of graft     HPI:  HPI The patient is a 66 year old gentleman who is a in follow-up for ulceration to his left lower extremity.  Last had in office application of Kerecis on 9/28  Ortho Exam On examination of the left lower leg there is a wound which is currently measuring 10 cm wide by 8.5 cm long. his is filled in with the 100% bleeding granulation after debridement.  There is no surrounding erythema.  There is circumferential epithelialization.  There is no maceration or epiboly.  Visit Diagnoses:  No diagnosis found.   Plan: The patient will resume daily dial soap cleansing. Apply dry dressing. Ace for compression. follow-up in 2 weeks for reevaluation.  Follow-Up Instructions: No follow-ups on file.   Imaging: No results found.  Orders:  No orders of the defined types were placed in this encounter.  No orders of the defined types were placed in this encounter.    PMFS History: Patient Active Problem List   Diagnosis Date Noted   Rectal bleeding 01/30/2022   Abnormal finding on imaging 01/30/2022   GIB (gastrointestinal bleeding) 01/29/2022   Hyponatremia 01/29/2022   Hypocalcemia 01/29/2022   Alcohol abuse 01/29/2022   AKI (acute kidney injury) ([place]) 01/29/2022   Symptomatic anemia 01/29/2022   Venous stasis dermatitis of both lower extremities    Bleeding on Coumadin    Leg wound, left, subsequent encounter 12/30/2021   Compartment syndrome of left lower extremity ([place]) 09/22/2021   Status post surgery 09/22/2021   Compartment syndrome of left upper extremity ([place]) 09/22/2021   Hyperlipidemia 12/13/2020   Atrial  fibrillation ([place]) 06/10/2020   Long term (current) use of anticoagulants 06/10/2020   Secondary hypercoagulable state ([place]) 04/24/2020   AV junctional bradycardia 03/22/2019   S/P minimally invasive mitral valve repair  03/21/2019   S/P Maze operation for atrial fibrillation 03/21/2019   Dilated aortic root ([place]) 01/04/2019   Dilated cardiomyopathy ([place]) 01/04/2019   Atrial flutter with rapid ventricular response ([place])    Mitral valve prolapse    Hypertensive urgency 12/30/2018   Acute on chronic systolic (congestive) heart failure ([place]) 50/35/4656   Acute systolic heart failure ([place]) 81/27/5170   Chronic systolic (congestive) heart failure (HCC)    Persistent atrial fibrillation ([place]) 12/02/2018   Right hamstring muscle strain 07/20/2018   Essential hypertension 02/07/2016   Severe mitral regurgitation    Annual physical exam 11/16/2014   Arthritis of left knee 10/23/2014   Status post total left knee replacement 10/23/2014   Arthritis of knee, right 01/19/2014   Status post total knee replacement 01/19/2014   Benign paroxysmal positional vertigo 10/30/2013   Testicular hypofunction 03/24/2012   Past Medical History:  Diagnosis Date   Acute on chronic systolic (congestive) heart failure ([place]) 12/30/2018   Allergy    Arthritis    Atrial flutter with rapid ventricular response (HCC)    Chronic systolic (congestive) heart failure ([place])    COVID 10/2021   mild   Dilated aortic root ([place]) 01/04/2019   Dilated cardiomyopathy ([place]) 01/04/2019   Dysrhythmia  Heart murmur    History of blood transfusion    01/27/22   History of colon polyps    Hypertension    Insomnia    Mitral valve prolapse    Mitral valve regurgitation    Persistent atrial fibrillation ([place]) 12/02/2018   S/P Maze operation for atrial fibrillation 03/21/2019   Complete bilateral atrial lesion set using cryothermy and bipolar radiofrequency ablation with clipping of LA appendage via right mini thoracotomy  approach   S/P minimally invasive mitral valve repair 03/21/2019   Complex valvuloplasty including triangular resection of posterior leaflet, artificial Gore-tex neochord placement x4 and 36 mm Sorin Memo 4D ring annuloplasty via right mini thoracotomy approach   Seizures ([place])    had one approx. 30 yrs ago,has not had any since; due to alchol and no sleep   Severe mitral regurgitation     Family History  Problem Relation Age of Onset   Hypertension Father    Colon cancer Father        dx in his late 74's   Diabetes Mother    Stomach cancer Neg Hx    Esophageal cancer Neg Hx    Pancreatic cancer Neg Hx    Rectal cancer Neg Hx     Past Surgical History:  Procedure Laterality Date   ANKLE FUSION  left   Dec. 1540   APPLICATION OF WOUND VAC Left 09/22/2021   Procedure: APPLICATION OF WOUND VAC;  Surgeon: Mcarthur Rossetti, MD;  Location: [place];  Service: Orthopedics;  Laterality: Left;   APPLICATION OF WOUND VAC Left 12/30/2021   Procedure: APPLICATION OF WOUND VAC;  Surgeon: Mcarthur Rossetti, MD;  Location: [place];  Service: Orthopedics;  Laterality: Left;   APPLICATION OF WOUND VAC Left 04/10/2022   Procedure: APPLICATION OF WOUND VAC;  Surgeon: Newt Minion, MD;  Location: [place];  Service: Orthopedics;  Laterality: Left;   ARTHROSCOPY KNEE W/ DRILLING  bilateral   2012   BIOPSY  01/30/2022   Procedure: BIOPSY;  Surgeon: Rush Landmark Telford Nab., MD;  [facility];  Service: Gastroenterology;;   CARDIAC VALVE REPLACEMENT     CARDIOVERSION N/A 01/03/2019   Procedure: CARDIOVERSION;  Surgeon: Sueanne Margarita, MD;  [facility];  Service: Cardiovascular;  Laterality: N/A;   CARDIOVERSION N/A 06/23/2019   Procedure: CARDIOVERSION;  Surgeon: Fay Records, MD;  Location: [place];  Service: Cardiovascular;  Laterality: N/A;   CLIPPING OF ATRIAL APPENDAGE  03/21/2019   Procedure: Clipping Of Atrial Appendage using 32m Atricure Pro2 Clip;  Surgeon: ORexene Alberts MD;  [place]  Service: Open Heart Surgery;;   COLONOSCOPY     x2   COLONOSCOPY N/A 01/30/2022   Procedure: COLONOSCOPY;  Surgeon: MIrving Copas, MD;  [facility];  Service: Gastroenterology;  Laterality: N/A;   ESOPHAGOGASTRODUODENOSCOPY N/A 01/30/2022   Procedure: ESOPHAGOGASTRODUODENOSCOPY (EGD);  Surgeon: MIrving Copas, MD;  [facility];  Service: Gastroenterology;  Laterality: N/A;   FOOT ARTHRODESIS  06/23/2012   Procedure: ARTHRODESIS FOOT;  Surgeon: JWylene Simmer MD;  Location: [place]  Service: Orthopedics;  Laterality: Left;  Left Subtalar and Talonavicular Joint Revision Arthrodesis  Aspiration of Bone Marrow from Left Hip    HAIR TRANSPLANT     HARDWARE REMOVAL  06/23/2012   Procedure: HARDWARE REMOVAL;  Surgeon: JWylene Simmer MD;  [place]  Service: Orthopedics;  Laterality: Left;  Removal of Deep Implant  X's 3   I & D EXTREMITY Left  09/23/2021   Procedure: IRRIGATION AND DEBRIDEMENT OF LEG;  Surgeon: Mcarthur Rossetti, MD;  Location: [place];  Service: Orthopedics;  Laterality: Left;   I & D EXTREMITY Left 09/26/2021   Procedure: REPEAT IRRIGATION AND DEBRIDEMENT LEFT LEG, POSSIBLE WOUND CLOSURE, POSSIBLE VAC CHANGE, POSSIBLE SKIN GRAFT;  Surgeon: Mcarthur Rossetti, MD;  Location: [place];  Service: Orthopedics;  Laterality: Left;   I & D EXTREMITY Left 12/30/2021   Procedure: IRRIGATION AND DEBRIDEMENT LEFT LOWER LEG WOUND;  Surgeon: Mcarthur Rossetti, MD;  Location: [place];  Service: Orthopedics;  Laterality: Left;   I & D EXTREMITY Left 01/02/2022   Procedure: LEFT LEG DEBRIDEMENT AND TISSUE GRAFT;  Surgeon: Newt Minion, MD;  Location: [place];  Service: Orthopedics;  Laterality: Left;   INCISION AND DRAINAGE WOUND WITH FASCIOTOMY Left 09/22/2021   Procedure: INCISION AND DRAINAGE WOUND WITH FASCIOTOMY;  Surgeon: Mcarthur Rossetti, MD;  Location: [place];  Service: Orthopedics;  Laterality: Left;    INSERTION OF MESH N/A 07/10/2016   Procedure: INSERTION OF MESH;  Surgeon: Coralie Keens, MD;  Location: [place];  Service: General;  Laterality: N/A;   JOINT REPLACEMENT     right hip  01-2011   LEFT HEART CATH AND CORONARY ANGIOGRAPHY N/A 03/16/2019   Procedure: LEFT HEART CATH AND CORONARY ANGIOGRAPHY;  Surgeon: Sherren Mocha, MD;  Location: [place] CV LAB;  Service: Cardiovascular;  Laterality: N/A;   LIMB SPARING RESECTION HIP W/ SADDLE JOINT REPLACEMENT Right    MINIMALLY INVASIVE MAZE PROCEDURE N/A 03/21/2019   Procedure: MINIMALLY INVASIVE MAZE PROCEDURE;  Surgeon: Rexene Alberts, MD;  Location: [place];  Service: Open Heart Surgery;  Laterality: N/A;   MITRAL VALVE REPAIR Right 03/21/2019   Procedure: MINIMALLY INVASIVE MITRAL VALVE REPAIR (MVR) using Memo 4D ring size 36;  Surgeon: Rexene Alberts, MD;  Location: [place];  Service: Open Heart Surgery;  Laterality: Right;   POLYPECTOMY  01/30/2022   Procedure: POLYPECTOMY;  Surgeon: Mansouraty, Telford Nab., MD;  [facility];  Service: Gastroenterology;;   SKIN SPLIT GRAFT Left 04/10/2022   Procedure: APPLY SKIN GRAFT TO LEFT LEG WOUND;  Surgeon: Newt Minion, MD;  Location: [place];  Service: Orthopedics;  Laterality: Left;   TEE WITHOUT CARDIOVERSION N/A 01/03/2019   Procedure: TRANSESOPHAGEAL ECHOCARDIOGRAM (TEE);  Surgeon: Sueanne Margarita, MD;  Location: [facility] ENDOSCOPY;  Service: Cardiovascular;  Laterality: N/A;   TEE WITHOUT CARDIOVERSION N/A 03/21/2019   Procedure: TRANSESOPHAGEAL ECHOCARDIOGRAM (TEE);  Surgeon: Rexene Alberts, MD;  Location: [place];  Service: Open Heart Surgery;  Laterality: N/A;   TEMPORARY PACEMAKER N/A 03/22/2019   Procedure: TEMPORARY PACEMAKER;  Surgeon: Belva Crome, MD;  Location: [place] CV LAB;  Service: Cardiovascular;  Laterality: N/A;   TOTAL KNEE ARTHROPLASTY Right 01/19/2014   Procedure: RIGHT TOTAL KNEE ARTHROPLASTY, Steroid injection left knee;  Surgeon: Mcarthur Rossetti, MD;  [facility];  Service: Orthopedics;  Laterality: Right;   TOTAL KNEE ARTHROPLASTY Left 10/23/2014   Procedure: LEFT TOTAL KNEE ARTHROPLASTY;  Surgeon: Mcarthur Rossetti, MD;  [facility];  Service: Orthopedics;  Laterality: Left;   UMBILICAL HERNIA REPAIR N/A 07/10/2016   Procedure: UMBILICAL HERNIA REPAIR;  Surgeon: Coralie Keens, MD;  Location: [place];  Service: General;  Laterality: N/A;   Social History   Occupational History   Occupation: Pension scheme manager  Tobacco Use   Smoking status: Never   Smokeless tobacco: Never  Vaping  Use   Vaping Use: Never used  Substance and Sexual Activity   Alcohol use: Not Currently    Alcohol/week: 21.0 standard drinks of alcohol    Types: 21 Shots of liquor per week   Drug use: No   Sexual activity: Yes

## 2022-08-28 ENCOUNTER — Other Ambulatory Visit: Payer: Self-pay | Admitting: Cardiovascular Disease

## 2022-08-28 NOTE — Progress Notes (Signed)
Office Visit Note   Patient: [name]           Date of Birth: 09-09-1956           MRN: [id number] Visit Date: 08/26/2022              Requested by: [name], MD [address] [place],  [place] [id number] PCP: [name], MD  Chief Complaint  Patient presents with   Left Leg - Follow-up    04/09/6719 surgical application kerecis graft 06/30/22, 07/30/22, 08/05/2022, and 94/70/96 in office application of graft       HPI: The patient is a 66 year old gentleman seen in follow-up for ulcer to the left lower extremity after traumatic hematoma.  This has been gradually improving last Kerecis applied in the office on October 4  Assessment & Plan: Visit Diagnoses:  1. Compartment syndrome of left lower extremity, subsequent encounter     Plan: He will continue with daily Dial soap cleansing dry dressings compression with stocking and Ace wrap follow-up in the office in 3 weeks  Follow-Up Instructions: Return in about 3 weeks (around 09/16/2022).   Ortho Exam  Patient is alert, oriented, no adenopathy, well-dressed, normal affect, normal respiratory effort. On examination of the left lower extremity the ulcer continues to improve this is 10 cm wide 8 cm in length with no depth filled in with the 100% bleeding granulation there is no surrounding erythema there is circumferential epithelialization.  Imaging: No results found.    Labs: Lab Results  Component Value Date   HGBA1C 5.4 01/29/2022   HGBA1C 5.2 03/16/2019   REPTSTATUS 01/07/2022 FINAL 01/02/2022   REPTSTATUS 01/08/2022 FINAL 01/02/2022   GRAMSTAIN  01/02/2022    FEW WBC PRESENT,BOTH PMN AND MONONUCLEAR RARE GRAM POSITIVE COCCI RARE GRAM NEGATIVE RODS Performed at [facility], [address] [place], [place] [address]    GRAMSTAIN  01/02/2022    FEW WBC PRESENT,BOTH PMN AND MONONUCLEAR NO ORGANISMS SEEN    CULT  01/02/2022    FEW STREPTOCOCCUS GROUP C Beta hemolytic  streptococci are predictably susceptible to penicillin and other beta lactams. Susceptibility testing not routinely performed. ABUNDANT KLEBSIELLA OXYTOCA ABUNDANT STENOTROPHOMONAS MALTOPHILIA    CULT  01/02/2022    NO ANAEROBES ISOLATED Performed at [facility], [address] [place], [place] [address]    [place] OXYTOCA 01/02/2022   LABORGA STENOTROPHOMONAS MALTOPHILIA 01/02/2022     Lab Results  Component Value Date   ALBUMIN 2.5 (L) 01/30/2022   ALBUMIN 3.1 (L) 01/29/2022   ALBUMIN 4.1 09/22/2021    Lab Results  Component Value Date   MG 2.1 01/29/2022   MG 2.1 06/19/2019   MG 2.2 03/25/2019   No results found for: "VD25OH"  No results found for: "PREALBUMIN"    Latest Ref Rng & Units 04/10/2022    8:06 AM 03/18/2022   11:08 AM 02/04/2022    8:39 AM  CBC EXTENDED  WBC 4.0 - 10.5 K/uL 6.0  6.5  8.0   RBC 4.22 - 5.81 MIL/uL 4.02  3.80  3.02   Hemoglobin 13.0 - 17.0 g/dL 9.7  9.1 Repeated and verified X2.  8.4 Repeated and verified X2.   HCT 39.0 - 52.0 % 34.4  30.0  25.8 Repeated and verified X2.   Platelets 150 - 400 K/uL 242  315.0  270.0   NEUT# 1.4 - 7.7 K/uL  4.8    Lymph# 0.7 - 4.0 K/uL  0.9       There is no height or weight on file to calculate BMI.  Orders:  No orders of the defined types were placed in this encounter.  No orders of the defined types were placed in this encounter.    Procedures: No procedures performed  Clinical Data: No additional findings.  ROS:  All other systems negative, except as noted in the HPI. Review of Systems  Objective: Vital Signs: There were no vitals taken for this visit.  Specialty Comments:  No specialty comments available.  PMFS History: Patient Active Problem List   Diagnosis Date Noted   Rectal bleeding 01/30/2022   Abnormal finding on imaging 01/30/2022   GIB (gastrointestinal bleeding) 01/29/2022   Hyponatremia 01/29/2022   Hypocalcemia 01/29/2022   Alcohol abuse 01/29/2022    AKI (acute kidney injury) ([place]) 01/29/2022   Symptomatic anemia 01/29/2022   Venous stasis dermatitis of both lower extremities    Bleeding on Coumadin    Leg wound, left, subsequent encounter 12/30/2021   Compartment syndrome of left lower extremity ([place]) 09/22/2021   Status post surgery 09/22/2021   Compartment syndrome of left upper extremity ([place]) 09/22/2021   Hyperlipidemia 12/13/2020   Atrial fibrillation ([place]) 06/10/2020   Long term (current) use of anticoagulants 06/10/2020   Secondary hypercoagulable state ([place]) 04/24/2020   AV junctional bradycardia 03/22/2019   S/P minimally invasive mitral valve repair  03/21/2019   S/P Maze operation for atrial fibrillation 03/21/2019   Dilated aortic root ([place]) 01/04/2019   Dilated cardiomyopathy ([place]) 01/04/2019   Atrial flutter with rapid ventricular response ([place])    Mitral valve prolapse    Hypertensive urgency 12/30/2018   Acute on chronic systolic (congestive) heart failure ([place]) 56/81/2751   Acute systolic heart failure ([place]) 70/11/7492   Chronic systolic (congestive) heart failure (HCC)    Persistent atrial fibrillation ([place]) 12/02/2018   Right hamstring muscle strain 07/20/2018   Essential hypertension 02/07/2016   Severe mitral regurgitation    Annual physical exam 11/16/2014   Arthritis of left knee 10/23/2014   Status post total left knee replacement 10/23/2014   Arthritis of knee, right 01/19/2014   Status post total knee replacement 01/19/2014   Benign paroxysmal positional vertigo 10/30/2013   Testicular hypofunction 03/24/2012   Past Medical History:  Diagnosis Date   Acute on chronic systolic (congestive) heart failure ([place]) 12/30/2018   Allergy    Arthritis    Atrial flutter with rapid ventricular response (HCC)    Chronic systolic (congestive) heart failure ([place])    COVID 10/2021   mild   Dilated aortic root ([place]) 01/04/2019   Dilated cardiomyopathy ([place]) 01/04/2019   Dysrhythmia    Heart murmur     History of blood transfusion    01/27/22   History of colon polyps    Hypertension    Insomnia    Mitral valve prolapse    Mitral valve regurgitation    Persistent atrial fibrillation ([place]) 12/02/2018   S/P Maze operation for atrial fibrillation 03/21/2019   Complete bilateral atrial lesion set using cryothermy and bipolar radiofrequency ablation with clipping of LA appendage via right mini thoracotomy approach   S/P minimally invasive mitral valve repair 03/21/2019   Complex valvuloplasty including triangular resection of posterior leaflet, artificial Gore-tex neochord placement x4 and 36 mm Sorin Memo 4D ring annuloplasty via right mini thoracotomy approach   Seizures ([place])    had one approx. 30 yrs ago,has not had any since; due to alchol and no sleep  Severe mitral regurgitation     Family History  Problem Relation Age of Onset   Hypertension Father    Colon cancer Father        dx in his late 11's   Diabetes Mother    Stomach cancer Neg Hx    Esophageal cancer Neg Hx    Pancreatic cancer Neg Hx    Rectal cancer Neg Hx     Past Surgical History:  Procedure Laterality Date   ANKLE FUSION  left   Dec. 6314   APPLICATION OF WOUND VAC Left 09/22/2021   Procedure: APPLICATION OF WOUND VAC;  Surgeon: Mcarthur Rossetti, MD;  Location: [place];  Service: Orthopedics;  Laterality: Left;   APPLICATION OF WOUND VAC Left 12/30/2021   Procedure: APPLICATION OF WOUND VAC;  Surgeon: Mcarthur Rossetti, MD;  Location: [place];  Service: Orthopedics;  Laterality: Left;   APPLICATION OF WOUND VAC Left 04/10/2022   Procedure: APPLICATION OF WOUND VAC;  Surgeon: Newt Minion, MD;  Location: [place];  Service: Orthopedics;  Laterality: Left;   ARTHROSCOPY KNEE W/ DRILLING  bilateral   2012   BIOPSY  01/30/2022   Procedure: BIOPSY;  Surgeon: Rush Landmark Telford Nab., MD;  [facility];  Service: Gastroenterology;;   CARDIAC VALVE REPLACEMENT     CARDIOVERSION N/A 01/03/2019    Procedure: CARDIOVERSION;  Surgeon: Sueanne Margarita, MD;  Location: [facility] ENDOSCOPY;  Service: Cardiovascular;  Laterality: N/A;   CARDIOVERSION N/A 06/23/2019   Procedure: CARDIOVERSION;  Surgeon: Fay Records, MD;  Location: [place];  Service: Cardiovascular;  Laterality: N/A;   CLIPPING OF ATRIAL APPENDAGE  03/21/2019   Procedure: Clipping Of Atrial Appendage using 60m Atricure Pro2 Clip;  Surgeon: ORexene Alberts MD;  Location: [place]  Service: Open Heart Surgery;;   COLONOSCOPY     x2   COLONOSCOPY N/A 01/30/2022   Procedure: COLONOSCOPY;  Surgeon: MIrving Copas, MD;  [facility];  Service: Gastroenterology;  Laterality: N/A;   ESOPHAGOGASTRODUODENOSCOPY N/A 01/30/2022   Procedure: ESOPHAGOGASTRODUODENOSCOPY (EGD);  Surgeon: MIrving Copas, MD;  [facility];  Service: Gastroenterology;  Laterality: N/A;   FOOT ARTHRODESIS  06/23/2012   Procedure: ARTHRODESIS FOOT;  Surgeon: JWylene Simmer MD;  [place]  Service: Orthopedics;  Laterality: Left;  Left Subtalar and Talonavicular Joint Revision Arthrodesis  Aspiration of Bone Marrow from Left Hip    HAIR TRANSPLANT     HARDWARE REMOVAL  06/23/2012   Procedure: HARDWARE REMOVAL;  Surgeon: JWylene Simmer MD;  Location[place]  Service: Orthopedics;  Laterality: Left;  Removal of Deep Implant  X's 3   I & D EXTREMITY Left 09/23/2021   Procedure: IRRIGATION AND DEBRIDEMENT OF LEG;  Surgeon: BMcarthur Rossetti MD;  [place]  Service: Orthopedics;  Laterality: Left;   I & D EXTREMITY Left 09/26/2021   Procedure: REPEAT IRRIGATION AND DEBRIDEMENT LEFT LEG, POSSIBLE WOUND CLOSURE, POSSIBLE VAC CHANGE, POSSIBLE SKIN GRAFT;  Surgeon: BMcarthur Rossetti MD;  [place]  Service: Orthopedics;  Laterality: Left;   I & D EXTREMITY Left 12/30/2021   Procedure: IRRIGATION AND DEBRIDEMENT LEFT LOWER LEG WOUND;  Surgeon: BMcarthur Rossetti MD;  [place]  Service: Orthopedics;   Laterality: Left;   I & D EXTREMITY Left 01/02/2022   Procedure: LEFT LEG DEBRIDEMENT AND TISSUE GRAFT;  Surgeon: DNewt Minion MD;  Location[place]  Service: Orthopedics;  Laterality: Left;   INCISION AND DRAINAGE WOUND WITH  FASCIOTOMY Left 09/22/2021   Procedure: INCISION AND DRAINAGE WOUND WITH FASCIOTOMY;  Surgeon: Mcarthur Rossetti, MD;  Location: [place];  Service: Orthopedics;  Laterality: Left;   INSERTION OF MESH N/A 07/10/2016   Procedure: INSERTION OF MESH;  Surgeon: Coralie Keens, MD;  Location: [place];  Service: General;  Laterality: N/A;   JOINT REPLACEMENT     right hip  01-2011   LEFT HEART CATH AND CORONARY ANGIOGRAPHY N/A 03/16/2019   Procedure: LEFT HEART CATH AND CORONARY ANGIOGRAPHY;  Surgeon: Sherren Mocha, MD;  Location: [place] [facility];  Service: Cardiovascular;  Laterality: N/A;   LIMB SPARING RESECTION HIP W/ SADDLE JOINT REPLACEMENT Right    MINIMALLY INVASIVE MAZE PROCEDURE N/A 03/21/2019   Procedure: MINIMALLY INVASIVE MAZE PROCEDURE;  Surgeon: Rexene Alberts, MD;  Location: [place];  Service: Open Heart Surgery;  Laterality: N/A;   MITRAL VALVE REPAIR Right 03/21/2019   Procedure: MINIMALLY INVASIVE MITRAL VALVE REPAIR (MVR) using Memo 4D ring size 36;  Surgeon: Rexene Alberts, MD;  Location: [place];  Service: Open Heart Surgery;  Laterality: Right;   POLYPECTOMY  01/30/2022   Procedure: POLYPECTOMY;  Surgeon: Mansouraty, Telford Nab., MD;  [facility];  Service: Gastroenterology;;   SKIN SPLIT GRAFT Left 04/10/2022   Procedure: APPLY SKIN GRAFT TO LEFT LEG WOUND;  Surgeon: Newt Minion, MD;  Location: [place];  Service: Orthopedics;  Laterality: Left;   TEE WITHOUT CARDIOVERSION N/A 01/03/2019   Procedure: TRANSESOPHAGEAL ECHOCARDIOGRAM (TEE);  Surgeon: Sueanne Margarita, MD;  Location: [facility] ENDOSCOPY;  Service: Cardiovascular;  Laterality: N/A;   TEE WITHOUT CARDIOVERSION N/A 03/21/2019   Procedure: TRANSESOPHAGEAL ECHOCARDIOGRAM  (TEE);  Surgeon: Rexene Alberts, MD;  Location: [place];  Service: Open Heart Surgery;  Laterality: N/A;   TEMPORARY PACEMAKER N/A 03/22/2019   Procedure: TEMPORARY PACEMAKER;  Surgeon: Belva Crome, MD;  Location: [place] [facility];  Service: Cardiovascular;  Laterality: N/A;   TOTAL KNEE ARTHROPLASTY Right 01/19/2014   Procedure: RIGHT TOTAL KNEE ARTHROPLASTY, Steroid injection left knee;  Surgeon: Mcarthur Rossetti, MD;  [facility];  Service: Orthopedics;  Laterality: Right;   TOTAL KNEE ARTHROPLASTY Left 10/23/2014   Procedure: LEFT TOTAL KNEE ARTHROPLASTY;  Surgeon: Mcarthur Rossetti, MD;  [facility];  Service: Orthopedics;  Laterality: Left;   UMBILICAL HERNIA REPAIR N/A 07/10/2016   Procedure: UMBILICAL HERNIA REPAIR;  Surgeon: Coralie Keens, MD;  Location: [place];  Service: General;  Laterality: N/A;   Social History   Occupational History   Occupation: Pension scheme manager  Tobacco Use   Smoking status: Never   Smokeless tobacco: Never  Vaping Use   Vaping Use: Never used  Substance and Sexual Activity   Alcohol use: Not Currently    Alcohol/week: 21.0 standard drinks of alcohol    Types: 21 Shots of liquor per week   Drug use: No   Sexual activity: Yes

## 2022-09-09 ENCOUNTER — Ambulatory Visit: Payer: Medicare HMO | Admitting: Family

## 2022-09-09 ENCOUNTER — Encounter: Payer: Self-pay | Admitting: Family

## 2022-09-09 DIAGNOSIS — S81802D Unspecified open wound, left lower leg, subsequent encounter: Secondary | ICD-10-CM

## 2022-09-09 NOTE — Progress Notes (Signed)
Office Visit Note   Patient: [name]           Date of Birth: 01/27/1956           MRN: [id number] Visit Date: 09/09/2022              Requested by: [name], MD [address] [place],  [place] [id number] PCP: [name], MD  Chief Complaint  Patient presents with   Left Leg - Wound Check    12/07/8525 surgical application kerecis graft 06/30/22, 07/30/22, 78/12/4233 in office application of graft        HPI: The patient is a 66 year old gentleman seen in routine follow-up for ulcer to the left lower extremity.  This continues to gradually improve.  Has been doing Silvadene dressing changes daily wearing a compression stocking over a dressing  Assessment & Plan: Visit Diagnoses: No diagnosis found.  Plan: Continue with daily Dial soap cleansing and Silvadene dressings compression follow-up in 4 weeks sooner should any concerns arise  Follow-Up Instructions: No follow-ups on file.   Ortho Exam  Patient is alert, oriented, no adenopathy, well-dressed, normal affect, normal respiratory effort. Please see attached image On examination left lower extremity there is good control of the edema from compression the ulcer continues to improve this is filling in circumferentially.  There is no depth filled in with 100% bleeding granulation no surrounding erythema or drainage Imaging: No results found.    Labs: Lab Results  Component Value Date   HGBA1C 5.4 01/29/2022   HGBA1C 5.2 03/16/2019   REPTSTATUS 01/07/2022 FINAL 01/02/2022   REPTSTATUS 01/08/2022 FINAL 01/02/2022   GRAMSTAIN  01/02/2022    FEW WBC PRESENT,BOTH PMN AND MONONUCLEAR RARE GRAM POSITIVE COCCI RARE GRAM NEGATIVE RODS Performed at [facility], [address], [place], [place] [address]    GRAMSTAIN  01/02/2022    FEW WBC PRESENT,BOTH PMN AND MONONUCLEAR NO ORGANISMS SEEN    CULT  01/02/2022    FEW STREPTOCOCCUS GROUP C Beta hemolytic streptococci are predictably  susceptible to penicillin and other beta lactams. Susceptibility testing not routinely performed. ABUNDANT KLEBSIELLA OXYTOCA ABUNDANT STENOTROPHOMONAS MALTOPHILIA    CULT  01/02/2022    NO ANAEROBES ISOLATED Performed at [facility], [place] [address], [place], [place] [address]    [place] OXYTOCA 01/02/2022   LABORGA STENOTROPHOMONAS MALTOPHILIA 01/02/2022     Lab Results  Component Value Date   ALBUMIN 2.5 (L) 01/30/2022   ALBUMIN 3.1 (L) 01/29/2022   ALBUMIN 4.1 09/22/2021    Lab Results  Component Value Date   MG 2.1 01/29/2022   MG 2.1 06/19/2019   MG 2.2 03/25/2019   No results found for: "VD25OH"  No results found for: "PREALBUMIN"    Latest Ref Rng & Units 04/10/2022    8:06 AM 03/18/2022   11:08 AM 02/04/2022    8:39 AM  CBC EXTENDED  WBC 4.0 - 10.5 K/uL 6.0  6.5  8.0   RBC 4.22 - 5.81 MIL/uL 4.02  3.80  3.02   Hemoglobin 13.0 - 17.0 g/dL 9.7  9.1 Repeated and verified X2.  8.4 Repeated and verified X2.   HCT 39.0 - 52.0 % 34.4  30.0  25.8 Repeated and verified X2.   Platelets 150 - 400 K/uL 242  315.0  270.0   NEUT# 1.4 - 7.7 K/uL  4.8    Lymph# 0.7 - 4.0 K/uL  0.9       There is no  height or weight on file to calculate BMI.  Orders:  No orders of the defined types were placed in this encounter.  No orders of the defined types were placed in this encounter.    Procedures: No procedures performed  Clinical Data: No additional findings.  ROS:  All other systems negative, except as noted in the HPI. Review of Systems  Objective: Vital Signs: There were no vitals taken for this visit.  Specialty Comments:  No specialty comments available.  PMFS History: Patient Active Problem List   Diagnosis Date Noted   Rectal bleeding 01/30/2022   Abnormal finding on imaging 01/30/2022   GIB (gastrointestinal bleeding) 01/29/2022   Hyponatremia 01/29/2022   Hypocalcemia 01/29/2022   Alcohol abuse 01/29/2022   AKI (acute kidney injury)  ([place]) 01/29/2022   Symptomatic anemia 01/29/2022   Venous stasis dermatitis of both lower extremities    Bleeding on Coumadin    Leg wound, left, subsequent encounter 12/30/2021   Compartment syndrome of left lower extremity ([place]) 09/22/2021   Status post surgery 09/22/2021   Compartment syndrome of left upper extremity ([place]) 09/22/2021   Hyperlipidemia 12/13/2020   Atrial fibrillation ([place]) 06/10/2020   Long term (current) use of anticoagulants 06/10/2020   Secondary hypercoagulable state ([place]) 04/24/2020   AV junctional bradycardia 03/22/2019   S/P minimally invasive mitral valve repair  03/21/2019   S/P Maze operation for atrial fibrillation 03/21/2019   Dilated aortic root ([place]) 01/04/2019   Dilated cardiomyopathy ([place]) 01/04/2019   Atrial flutter with rapid ventricular response ([place])    Mitral valve prolapse    Hypertensive urgency 12/30/2018   Acute on chronic systolic (congestive) heart failure ([place]) 33/54/5625   Acute systolic heart failure ([place]) 63/89/3734   Chronic systolic (congestive) heart failure (H[facility])    Persistent atrial fibrillation ([place]) 12/02/2018   Right hamstring muscle strain 07/20/2018   Essential hypertension 02/07/2016   Severe mitral regurgitation    Annual physical exam 11/16/2014   Arthritis of left knee 10/23/2014   Status post total left knee replacement 10/23/2014   Arthritis of knee, right 01/19/2014   Status post total knee replacement 01/19/2014   Benign paroxysmal positional vertigo 10/30/2013   Testicular hypofunction 03/24/2012   Past Medical History:  Diagnosis Date   Acute on chronic systolic (congestive) heart failure ([place]) 12/30/2018   Allergy    Arthritis    Atrial flutter with rapid ventricular response (H[facility])    Chronic systolic (congestive) heart failure ([place])    COVID 10/2021   mild   Dilated aortic root ([place]) 01/04/2019   Dilated cardiomyopathy ([place]) 01/04/2019   Dysrhythmia    Heart murmur    History of blood transfusion     01/27/22   History of colon polyps    Hypertension    Insomnia    Mitral valve prolapse    Mitral valve regurgitation    Persistent atrial fibrillation ([place]) 12/02/2018   S/P Maze operation for atrial fibrillation 03/21/2019   Complete bilateral atrial lesion set using cryothermy and bipolar radiofrequency ablation with clipping of LA appendage via right mini thoracotomy approach   S/P minimally invasive mitral valve repair 03/21/2019   Complex valvuloplasty including triangular resection of posterior leaflet, artificial Gore-tex neochord placement x4 and 36 mm Sorin Memo 4D ring annuloplasty via right mini thoracotomy approach   Seizures ([place])    had one approx. 30 yrs ago,has not had any since; due to alchol and no sleep   Severe mitral regurgitation     Family History  Problem Relation Age of Onset   Hypertension Father    Colon cancer Father        dx in his late 53's   Diabetes Mother    Stomach cancer Neg Hx    Esophageal cancer Neg Hx    Pancreatic cancer Neg Hx    Rectal cancer Neg Hx     Past Surgical History:  Procedure Laterality Date   ANKLE FUSION  left   Dec. 4270   APPLICATION OF WOUND VAC Left 09/22/2021   Procedure: APPLICATION OF WOUND VAC;  Surgeon: Mcarthur Rossetti, MD;  Location: [facility];  Service: Orthopedics;  Laterality: Left;   APPLICATION OF WOUND VAC Left 12/30/2021   Procedure: APPLICATION OF WOUND VAC;  Surgeon: Mcarthur Rossetti, MD;  Location: [facility];  Service: Orthopedics;  Laterality: Left;   APPLICATION OF WOUND VAC Left 04/10/2022   Procedure: APPLICATION OF WOUND VAC;  Surgeon: Newt Minion, MD;  Location: [place];  Service: Orthopedics;  Laterality: Left;   ARTHROSCOPY KNEE W/ DRILLING  bilateral   2012   BIOPSY  01/30/2022   Procedure: BIOPSY;  Surgeon: Rush Landmark Telford Nab., MD;  Location: [facility];  Service: Gastroenterology;;   CARDIAC VALVE REPLACEMENT     CARDIOVERSION N/A 01/03/2019   Procedure: CARDIOVERSION;  Surgeon:  Sueanne Margarita, MD;  Location: [facility];  Service: Cardiovascular;  Laterality: N/A;   CARDIOVERSION N/A 06/23/2019   Procedure: CARDIOVERSION;  Surgeon: Fay Records, MD;  Location: [facility];  Service: Cardiovascular;  Laterality: N/A;   CLIPPING OF ATRIAL APPENDAGE  03/21/2019   Procedure: Clipping Of Atrial Appendage using 78m Atricure Pro2 Clip;  Surgeon: ORexene Alberts MD;  Location: [facility]  Service: Open Heart Surgery;;   COLONOSCOPY     x2   COLONOSCOPY N/A 01/30/2022   Procedure: COLONOSCOPY;  Surgeon: MIrving Copas, MD;  [facility];  Service: Gastroenterology;  Laterality: N/A;   ESOPHAGOGASTRODUODENOSCOPY N/A 01/30/2022   Procedure: ESOPHAGOGASTRODUODENOSCOPY (EGD);  Surgeon: MIrving Copas, MD;  [facility];  Service: Gastroenterology;  Laterality: N/A;   FOOT ARTHRODESIS  06/23/2012   Procedure: ARTHRODESIS FOOT;  Surgeon: JWylene Simmer MD;  [place] [facility]  Service: Orthopedics;  Laterality: Left;  Left Subtalar and Talonavicular Joint Revision Arthrodesis  Aspiration of Bone Marrow from Left Hip    HAIR TRANSPLANT     HARDWARE REMOVAL  06/23/2012   Procedure: HARDWARE REMOVAL;  Surgeon: JWylene Simmer MD;  Location[place] [facility]  Service: Orthopedics;  Laterality: Left;  Removal of Deep Implant  X's 3   I & D EXTREMITY Left 09/23/2021   Procedure: IRRIGATION AND DEBRIDEMENT OF LEG;  Surgeon: BMcarthur Rossetti MD;  Location[place] [facility]  Service: Orthopedics;  Laterality: Left;   I & D EXTREMITY Left 09/26/2021   Procedure: REPEAT IRRIGATION AND DEBRIDEMENT LEFT LEG, POSSIBLE WOUND CLOSURE, POSSIBLE VAC CHANGE, POSSIBLE SKIN GRAFT;  Surgeon: BMcarthur Rossetti MD;  Location: [facility]  Service: Orthopedics;  Laterality: Left;   I & D EXTREMITY Left 12/30/2021   Procedure: IRRIGATION AND DEBRIDEMENT LEFT LOWER LEG WOUND;  Surgeon: BMcarthur Rossetti MD;  [place] [facility]  Service: Orthopedics;  Laterality: Left;   I & D EXTREMITY  Left 01/02/2022   Procedure: LEFT LEG DEBRIDEMENT AND TISSUE GRAFT;  Surgeon: DNewt Minion MD;  [place] [facility]  Service: Orthopedics;  Laterality: Left;   INCISION AND DRAINAGE WOUND WITH FASCIOTOMY Left 09/22/2021   Procedure: INCISION AND DRAINAGE WOUND  WITH FASCIOTOMY;  Surgeon: Mcarthur Rossetti, MD;  Location: [place];  Service: Orthopedics;  Laterality: Left;   INSERTION OF MESH N/A 07/10/2016   Procedure: INSERTION OF MESH;  Surgeon: Coralie Keens, MD;  Location: [facility];  Service: General;  Laterality: N/A;   JOINT REPLACEMENT     right hip  01-2011   LEFT HEART CATH AND CORONARY ANGIOGRAPHY N/A 03/16/2019   Procedure: LEFT HEART CATH AND CORONARY ANGIOGRAPHY;  Surgeon: Sherren Mocha, MD;  Location: [facility];  Service: Cardiovascular;  Laterality: N/A;   LIMB SPARING RESECTION HIP W/ SADDLE JOINT REPLACEMENT Right    MINIMALLY INVASIVE MAZE PROCEDURE N/A 03/21/2019   Procedure: MINIMALLY INVASIVE MAZE PROCEDURE;  Surgeon: Rexene Alberts, MD;  Location: [place];  Service: Open Heart Surgery;  Laterality: N/A;   MITRAL VALVE REPAIR Right 03/21/2019   Procedure: MINIMALLY INVASIVE MITRAL VALVE REPAIR (MVR) using Memo 4D ring size 36;  Surgeon: Rexene Alberts, MD;  Location: [place];  Service: Open Heart Surgery;  Laterality: Right;   POLYPECTOMY  01/30/2022   Procedure: POLYPECTOMY;  Surgeon: Mansouraty, Telford Nab., MD;  Location: [facility];  Service: Gastroenterology;;   SKIN SPLIT GRAFT Left 04/10/2022   Procedure: APPLY SKIN GRAFT TO LEFT LEG WOUND;  Surgeon: Newt Minion, MD;  Location: [place];  Service: Orthopedics;  Laterality: Left;   TEE WITHOUT CARDIOVERSION N/A 01/03/2019   Procedure: TRANSESOPHAGEAL ECHOCARDIOGRAM (TEE);  Surgeon: Sueanne Margarita, MD;  [facility];  Service: Cardiovascular;  Laterality: N/A;   TEE WITHOUT CARDIOVERSION N/A 03/21/2019   Procedure: TRANSESOPHAGEAL ECHOCARDIOGRAM (TEE);  Surgeon: Rexene Alberts, MD;   Location: [place];  Service: Open Heart Surgery;  Laterality: N/A;   TEMPORARY PACEMAKER N/A 03/22/2019   Procedure: TEMPORARY PACEMAKER;  Surgeon: Belva Crome, MD;  Location: [place] [facility];  Service: Cardiovascular;  Laterality: N/A;   TOTAL KNEE ARTHROPLASTY Right 01/19/2014   Procedure: RIGHT TOTAL KNEE ARTHROPLASTY, Steroid injection left knee;  Surgeon: Mcarthur Rossetti, MD;  [facility];  Service: Orthopedics;  Laterality: Right;   TOTAL KNEE ARTHROPLASTY Left 10/23/2014   Procedure: LEFT TOTAL KNEE ARTHROPLASTY;  Surgeon: Mcarthur Rossetti, MD;  [facility];  Service: Orthopedics;  Laterality: Left;   UMBILICAL HERNIA REPAIR N/A 07/10/2016   Procedure: UMBILICAL HERNIA REPAIR;  Surgeon: Coralie Keens, MD;  Location: [facility];  Service: General;  Laterality: N/A;   Social History   Occupational History   Occupation: Pension scheme manager  Tobacco Use   Smoking status: Never   Smokeless tobacco: Never  Vaping Use   Vaping Use: Never used  Substance and Sexual Activity   Alcohol use: Not Currently    Alcohol/week: 21.0 standard drinks of alcohol    Types: 21 Shots of liquor per week   Drug use: No   Sexual activity: Yes

## 2022-09-30 ENCOUNTER — Other Ambulatory Visit: Payer: Self-pay | Admitting: Cardiovascular Disease

## 2022-09-30 DIAGNOSIS — I4819 Other persistent atrial fibrillation: Secondary | ICD-10-CM

## 2022-09-30 NOTE — Telephone Encounter (Signed)
Last INR 08/18/22 Last OV 07/14/22

## 2022-10-07 ENCOUNTER — Encounter: Payer: Self-pay | Admitting: Family

## 2022-10-07 ENCOUNTER — Ambulatory Visit (INDEPENDENT_AMBULATORY_CARE_PROVIDER_SITE_OTHER): Payer: Medicare HMO | Admitting: Family

## 2022-10-07 DIAGNOSIS — S81802D Unspecified open wound, left lower leg, subsequent encounter: Secondary | ICD-10-CM | POA: Diagnosis not present

## 2022-10-07 NOTE — Progress Notes (Signed)
Office Visit Note   Patient: [name]           Date of Birth: 20-Feb-1956           MRN: [id number] Visit Date: 10/07/2022              Requested by: [name], MD [address] [place],  [place] [id number] PCP: [name], MD  Chief Complaint  Patient presents with   Left Leg - Follow-up    01/07/8587 surgical application kerecis graft 06/30/22, 07/30/22, 50/12/7739 in office application of graft       HPI: The patient is a 66 year old gentleman seen in routine follow-up care for ulcer to the left lower extremity after a hematoma.  This continues to improve he has been doing daily dry dressing changes using compression over the dressing.  Assessment & Plan: Visit Diagnoses: No diagnosis found.  Plan: Pleased with improvement in the wound he will continue with current wound care follow-up in 4 weeks  Follow-Up Instructions: No follow-ups on file.   Ortho Exam  Patient is alert, oriented, no adenopathy, well-dressed, normal affect, normal respiratory effort. On examination of the left lower extremity there continues to be control of the edema from compression.  The ulcer is filling in quite well this is now measuring 6 cm x 7-1/2 cm there is scant fibrinous tissue this was debrided with gauze there is bleeding granulation tissue no surrounding erythema or sign of infection  Imaging: No results found.    Labs: Lab Results  Component Value Date   HGBA1C 5.4 01/29/2022   HGBA1C 5.2 03/16/2019   REPTSTATUS 01/07/2022 FINAL 01/02/2022   REPTSTATUS 01/08/2022 FINAL 01/02/2022   GRAMSTAIN  01/02/2022    FEW WBC PRESENT,BOTH PMN AND MONONUCLEAR RARE GRAM POSITIVE COCCI RARE GRAM NEGATIVE RODS Performed at [facility], [address] [place], [place] [address]    GRAMSTAIN  01/02/2022    FEW WBC PRESENT,BOTH PMN AND MONONUCLEAR NO ORGANISMS SEEN    CULT  01/02/2022    FEW STREPTOCOCCUS GROUP C Beta hemolytic streptococci are  predictably susceptible to penicillin and other beta lactams. Susceptibility testing not routinely performed. ABUNDANT KLEBSIELLA OXYTOCA ABUNDANT STENOTROPHOMONAS MALTOPHILIA    CULT  01/02/2022    NO ANAEROBES ISOLATED Performed at [facility], [place] [address], [place], [place] [address]    [place]enson OXYTOCA 01/02/2022   LABORGA STENOTROPHOMONAS MALTOPHILIA 01/02/2022     Lab Results  Component Value Date   ALBUMIN 2.5 (L) 01/30/2022   ALBUMIN 3.1 (L) 01/29/2022   ALBUMIN 4.1 09/22/2021    Lab Results  Component Value Date   MG 2.1 01/29/2022   MG 2.1 06/19/2019   MG 2.2 03/25/2019   No results found for: "VD25OH"  No results found for: "PREALBUMIN"    Latest Ref Rng & Units 04/10/2022    8:06 AM 03/18/2022   11:08 AM 02/04/2022    8:39 AM  CBC EXTENDED  WBC 4.0 - 10.5 K/uL 6.0  6.5  8.0   RBC 4.22 - 5.81 MIL/uL 4.02  3.80  3.02   Hemoglobin 13.0 - 17.0 g/dL 9.7  9.1 Repeated and verified X2.  8.4 Repeated and verified X2.   HCT 39.0 - 52.0 % 34.4  30.0  25.8 Repeated and verified X2.   Platelets 150 - 400 K/uL 242  315.0  270.0   NEUT# 1.4 - 7.7 K/uL  4.8    Lymph# 0.7 - 4.0 K/uL  0.9       There is no height or weight on file to calculate BMI.  Orders:  No orders of the defined types were placed in this encounter.  No orders of the defined types were placed in this encounter.    Procedures: No procedures performed  Clinical Data: No additional findings.  ROS:  All other systems negative, except as noted in the HPI. Review of Systems  Objective: Vital Signs: There were no vitals taken for this visit.  Specialty Comments:  No specialty comments available.  PMFS History: Patient Active Problem List   Diagnosis Date Noted   Rectal bleeding 01/30/2022   Abnormal finding on imaging 01/30/2022   GIB (gastrointestinal bleeding) 01/29/2022   Hyponatremia 01/29/2022   Hypocalcemia 01/29/2022   Alcohol abuse 01/29/2022   AKI (acute  kidney injury) ([place]) 01/29/2022   Symptomatic anemia 01/29/2022   Venous stasis dermatitis of both lower extremities    Bleeding on Coumadin    Leg wound, left, subsequent encounter 12/30/2021   Compartment syndrome of left lower extremity ([place]) 09/22/2021   Status post surgery 09/22/2021   Compartment syndrome of left upper extremity ([place]) 09/22/2021   Hyperlipidemia 12/13/2020   Atrial fibrillation ([place]) 06/10/2020   Long term (current) use of anticoagulants 06/10/2020   Secondary hypercoagulable state ([place]) 04/24/2020   AV junctional bradycardia 03/22/2019   S/P minimally invasive mitral valve repair  03/21/2019   S/P Maze operation for atrial fibrillation 03/21/2019   Dilated aortic root ([place]) 01/04/2019   Dilated cardiomyopathy ([place]) 01/04/2019   Atrial flutter with rapid ventricular response ([place])    Mitral valve prolapse    Hypertensive urgency 12/30/2018   Acute on chronic systolic (congestive) heart failure ([place]) 94/70/9628   Acute systolic heart failure ([place]) 36/62/9476   Chronic systolic (congestive) heart failure (HCC)    Persistent atrial fibrillation ([place]) 12/02/2018   Right hamstring muscle strain 07/20/2018   Essential hypertension 02/07/2016   Severe mitral regurgitation    Annual physical exam 11/16/2014   Arthritis of left knee 10/23/2014   Status post total left knee replacement 10/23/2014   Arthritis of knee, right 01/19/2014   Status post total knee replacement 01/19/2014   Benign paroxysmal positional vertigo 10/30/2013   Testicular hypofunction 03/24/2012   Past Medical History:  Diagnosis Date   Acute on chronic systolic (congestive) heart failure ([place]) 12/30/2018   Allergy    Arthritis    Atrial flutter with rapid ventricular response (HCC)    Chronic systolic (congestive) heart failure ([place])    COVID 10/2021   mild   Dilated aortic root ([place]) 01/04/2019   Dilated cardiomyopathy ([place]) 01/04/2019   Dysrhythmia    Heart murmur    History of  blood transfusion    01/27/22   History of colon polyps    Hypertension    Insomnia    Mitral valve prolapse    Mitral valve regurgitation    Persistent atrial fibrillation ([place]) 12/02/2018   S/P Maze operation for atrial fibrillation 03/21/2019   Complete bilateral atrial lesion set using cryothermy and bipolar radiofrequency ablation with clipping of LA appendage via right mini thoracotomy approach   S/P minimally invasive mitral valve repair 03/21/2019   Complex valvuloplasty including triangular resection of posterior leaflet, artificial Gore-tex neochord placement x4 and 36 mm Sorin Memo 4D ring annuloplasty via right mini thoracotomy approach   Seizures ([place])    had one approx. 30 yrs ago,has not had any since; due to alchol and no sleep  Severe mitral regurgitation     Family History  Problem Relation Age of Onset   Hypertension Father    Colon cancer Father        dx in his late 28's   Diabetes Mother    Stomach cancer Neg Hx    Esophageal cancer Neg Hx    Pancreatic cancer Neg Hx    Rectal cancer Neg Hx     Past Surgical History:  Procedure Laterality Date   ANKLE FUSION  left   Dec. 9629   APPLICATION OF WOUND VAC Left 09/22/2021   Procedure: APPLICATION OF WOUND VAC;  Surgeon: Mcarthur Rossetti, MD;  Location: [facility];  Service: Orthopedics;  Laterality: Left;   APPLICATION OF WOUND VAC Left 12/30/2021   Procedure: APPLICATION OF WOUND VAC;  Surgeon: Mcarthur Rossetti, MD;  Location: [facility];  Service: Orthopedics;  Laterality: Left;   APPLICATION OF WOUND VAC Left 04/10/2022   Procedure: APPLICATION OF WOUND VAC;  Surgeon: Newt Minion, MD;  Location: [facility];  Service: Orthopedics;  Laterality: Left;   ARTHROSCOPY KNEE W/ DRILLING  bilateral   2012   BIOPSY  01/30/2022   Procedure: BIOPSY;  Surgeon: Rush Landmark Telford Nab., MD;  [facility];  Service: Gastroenterology;;   CARDIAC VALVE REPLACEMENT     CARDIOVERSION N/A 01/03/2019   Procedure:  CARDIOVERSION;  Surgeon: Sueanne Margarita, MD;  Location: [facility];  Service: Cardiovascular;  Laterality: N/A;   CARDIOVERSION N/A 06/23/2019   Procedure: CARDIOVERSION;  Surgeon: Fay Records, MD;  Location: [facility];  Service: Cardiovascular;  Laterality: N/A;   CLIPPING OF ATRIAL APPENDAGE  03/21/2019   Procedure: Clipping Of Atrial Appendage using 45m Atricure Pro2 Clip;  Surgeon: ORexene Alberts MD;  [facility]  Service: Open Heart Surgery;;   COLONOSCOPY     x2   COLONOSCOPY N/A 01/30/2022   Procedure: COLONOSCOPY;  Surgeon: MIrving Copas, MD;  [facility];  Service: Gastroenterology;  Laterality: N/A;   ESOPHAGOGASTRODUODENOSCOPY N/A 01/30/2022   Procedure: ESOPHAGOGASTRODUODENOSCOPY (EGD);  Surgeon: MIrving Copas, MD;  [facility];  Service: Gastroenterology;  Laterality: N/A;   FOOT ARTHRODESIS  06/23/2012   Procedure: ARTHRODESIS FOOT;  Surgeon: JWylene Simmer MD;  [facility]  Service: Orthopedics;  Laterality: Left;  Left Subtalar and Talonavicular Joint Revision Arthrodesis  Aspiration of Bone Marrow from Left Hip    HAIR TRANSPLANT     HARDWARE REMOVAL  06/23/2012   Procedure: HARDWARE REMOVAL;  Surgeon: JWylene Simmer MD;  [facility]  Service: Orthopedics;  Laterality: Left;  Removal of Deep Implant  X's 3   I & D EXTREMITY Left 09/23/2021   Procedure: IRRIGATION AND DEBRIDEMENT OF LEG;  Surgeon: BMcarthur Rossetti MD;  [facility]  Service: Orthopedics;  Laterality: Left;   I & D EXTREMITY Left 09/26/2021   Procedure: REPEAT IRRIGATION AND DEBRIDEMENT LEFT LEG, POSSIBLE WOUND CLOSURE, POSSIBLE VAC CHANGE, POSSIBLE SKIN GRAFT;  Surgeon: BMcarthur Rossetti MD;  [facility]  Service: Orthopedics;  Laterality: Left;   I & D EXTREMITY Left 12/30/2021   Procedure: IRRIGATION AND DEBRIDEMENT LEFT LOWER LEG WOUND;  Surgeon: BMcarthur Rossetti MD;  [facility]  Service: Orthopedics;  Laterality:  Left;   I & D EXTREMITY Left 01/02/2022   Procedure: LEFT LEG DEBRIDEMENT AND TISSUE GRAFT;  Surgeon: DNewt Minion MD;  [facility]  Service: Orthopedics;  Laterality: Left;   INCISION AND DRAINAGE WOUND WITH  FASCIOTOMY Left 09/22/2021   Procedure: INCISION AND DRAINAGE WOUND WITH FASCIOTOMY;  Surgeon: Mcarthur Rossetti, MD;  Location: [facility];  Service: Orthopedics;  Laterality: Left;   INSERTION OF MESH N/A 07/10/2016   Procedure: INSERTION OF MESH;  Surgeon: Coralie Keens, MD;  Location: [facility];  Service: General;  Laterality: N/A;   JOINT REPLACEMENT     right hip  01-2011   LEFT HEART CATH AND CORONARY ANGIOGRAPHY N/A 03/16/2019   Procedure: LEFT HEART CATH AND CORONARY ANGIOGRAPHY;  Surgeon: Sherren Mocha, MD;  Location: [facility];  Service: Cardiovascular;  Laterality: N/A;   LIMB SPARING RESECTION HIP W/ SADDLE JOINT REPLACEMENT Right    MINIMALLY INVASIVE MAZE PROCEDURE N/A 03/21/2019   Procedure: MINIMALLY INVASIVE MAZE PROCEDURE;  Surgeon: Rexene Alberts, MD;  Location: [facility];  Service: Open Heart Surgery;  Laterality: N/A;   MITRAL VALVE REPAIR Right 03/21/2019   Procedure: MINIMALLY INVASIVE MITRAL VALVE REPAIR (MVR) using Memo 4D ring size 36;  Surgeon: Rexene Alberts, MD;  Location: [facility];  Service: Open Heart Surgery;  Laterality: Right;   POLYPECTOMY  01/30/2022   Procedure: POLYPECTOMY;  Surgeon: Mansouraty, Telford Nab., MD;  Location[facility];  Service: Gastroenterology;;   SKIN SPLIT GRAFT Left 04/10/2022   Procedure: APPLY SKIN GRAFT TO LEFT LEG WOUND;  Surgeon: Newt Minion, MD;  Location: [facility];  Service: Orthopedics;  Laterality: Left;   TEE WITHOUT CARDIOVERSION N/A 01/03/2019   Procedure: TRANSESOPHAGEAL ECHOCARDIOGRAM (TEE);  Surgeon: Sueanne Margarita, MD;  Location: [facility];  Service: Cardiovascular;  Laterality: N/A;   TEE WITHOUT CARDIOVERSION N/A 03/21/2019   Procedure: TRANSESOPHAGEAL ECHOCARDIOGRAM (TEE);  Surgeon:  Rexene Alberts, MD;  Location: [facility];  Service: Open Heart Surgery;  Laterality: N/A;   TEMPORARY PACEMAKER N/A 03/22/2019   Procedure: TEMPORARY PACEMAKER;  Surgeon: Belva Crome, MD;  Location: [facility];  Service: Cardiovascular;  Laterality: N/A;   TOTAL KNEE ARTHROPLASTY Right 01/19/2014   Procedure: RIGHT TOTAL KNEE ARTHROPLASTY, Steroid injection left knee;  Surgeon: Mcarthur Rossetti, MD;  [facility];  Service: Orthopedics;  Laterality: Right;   TOTAL KNEE ARTHROPLASTY Left 10/23/2014   Procedure: LEFT TOTAL KNEE ARTHROPLASTY;  Surgeon: Mcarthur Rossetti, MD;  [facility];  Service: Orthopedics;  Laterality: Left;   UMBILICAL HERNIA REPAIR N/A 07/10/2016   Procedure: UMBILICAL HERNIA REPAIR;  Surgeon: Coralie Keens, MD;  Location: [facility];  Service: General;  Laterality: N/A;   Social History   Occupational History   Occupation: Pension scheme manager  Tobacco Use   Smoking status: Never   Smokeless tobacco: Never  Vaping Use   Vaping Use: Never used  Substance and Sexual Activity   Alcohol use: Not Currently    Alcohol/week: 21.0 standard drinks of alcohol    Types: 21 Shots of liquor per week   Drug use: No   Sexual activity: Yes

## 2022-10-13 ENCOUNTER — Ambulatory Visit: Payer: Medicare HMO | Admitting: Cardiovascular Disease

## 2022-10-13 ENCOUNTER — Ambulatory Visit: Payer: Medicare HMO | Attending: Cardiovascular Disease

## 2022-10-13 ENCOUNTER — Encounter: Payer: Self-pay | Admitting: Cardiovascular Disease

## 2022-10-13 VITALS — BP 120/78 | HR 83 | Ht 75.0 in | Wt 252.0 lb

## 2022-10-13 DIAGNOSIS — I519 Heart disease, unspecified: Secondary | ICD-10-CM | POA: Diagnosis not present

## 2022-10-13 DIAGNOSIS — Z5181 Encounter for therapeutic drug level monitoring: Secondary | ICD-10-CM

## 2022-10-13 DIAGNOSIS — I10 Essential (primary) hypertension: Secondary | ICD-10-CM | POA: Diagnosis not present

## 2022-10-13 DIAGNOSIS — I4819 Other persistent atrial fibrillation: Secondary | ICD-10-CM | POA: Diagnosis not present

## 2022-10-13 DIAGNOSIS — I4892 Unspecified atrial flutter: Secondary | ICD-10-CM

## 2022-10-13 DIAGNOSIS — Z7901 Long term (current) use of anticoagulants: Secondary | ICD-10-CM

## 2022-10-13 LAB — POCT INR: INR: 1.6 — AB (ref 2.0–3.0)

## 2022-10-13 NOTE — Progress Notes (Signed)
10/13/2022 [name]   June 16, 1956  [id number]  Primary Physician [name], MD Primary Cardiologist: Lorretta Harp MD Lupe Carney, [place]  HPI:  [name] is a 66 y.o.  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 He was referred by Dr. Coletta Memos for cardiovascular evaluation because of hypertension and mitral regurgitation.  I last saw him in the office 07/14/2022.  His only cardiac risk factor is treated hypertension. He does not smoke. He's never had a heart attack or stroke. He is on 3 antihypertensive medications and is aware of some restriction. His last 2-D echocardiogram performed 11/25/12 revealed mildly dilated left ventricle with normal systolic function, moderate to severe MR and severe left atrial enlargement. He remains in sinus rhythm and otherwise is asymptomatic.     When I saw him in the office last he was in A. fib with RVR   He does complain of some increasing shortness of breath as well.  He appeared to be volume overloaded, and and heart failure.  I arrange for him to be admitted to the hospital where he was diuresed and underwent TEE guided DC cardioversion back to sinus rhythm.  He is on Eliquis.  He was seen by Dr. Roxy Manns who agreed that he needed mitral valve repair.  His EF was 35 to 40% with severe MR and a flail P2 segment of the posterior leaflet with a ruptured chordae.  He is lost from 287 down to 241 pounds.  He is in sinus rhythm clinically and is improved.   He ultimately underwent cardiac catheterization by Dr. Burt Knack 03/16/2019 revealing normal coronary arteries.  5 days later on 03/21/2019 he underwent minimally invasive mitral valve repair along with a surgical Maze procedure and left atrial clipping.  He was discharged home on 03/28/2019 on Coumadin anticoagulation.  He was seen by Kerin Ransom in the office 05/29/2019 and was found to be in atrial fibrillation.  He was on  amiodarone.  He underwent outpatient cardioversion successfully on 06/22/2019 by Dr. Dorris Carnes.  A 2D echo performed 05/26/2019 revealed improvement in his EF to normal with no evidence of mitral regurgitation.   Since I saw him in the office 3 months ago he did have a 2D echocardiogram performed in follow-up of his mitral valve repair 08/07/2022 that showed a reduction in his EF from 45 to 50% down to 25% although he remains asymptomatic.   Current Meds  Medication Sig   ALPRAZolam (XANAX) 0.5 MG tablet Take 0.5 mg by mouth 2 (two) times daily.   atorvastatin (LIPITOR) 20 MG tablet Take 20 mg by mouth every morning.   diphenhydrAMINE (BENADRYL) 2 % cream Apply 1 Application topically 3 (three) times daily as needed (seasonal allergies.).   diphenhydrAMINE (BENADRYL) 25 mg capsule Take 25 mg by mouth every 6 (six) hours as needed (seasonal allergies.).   docusate sodium (COLACE) 100 MG capsule Take 300 mg by mouth in the morning.   ferrous gluconate (FERGON) 324 MG tablet Take 1 tablet (324 mg total) by mouth daily with breakfast.   HYDROcodone-acetaminophen (NORCO/VICODIN) 5-325 MG tablet Take 1 tablet by mouth every 12 (twelve) hours as needed.   losartan (COZAAR) 50 MG tablet Take 1 tablet (50 mg total) by mouth daily.   meclizine (ANTIVERT) 25 MG tablet Take 50 mg by mouth in the morning.   Melatonin 10 MG TABS Take 10 mg by mouth at bedtime.  metoprolol tartrate (LOPRESSOR) 25 MG tablet TAKE 1/2 (ONE-HALF) TABLET BY MOUTH TWICE DAILY . APPOINTMENT MUST KEEP SCHEDULED APPOINTMENT FOR FUTURE REFILLS   Multiple Vitamin (MULTIVITAMIN WITH MINERALS) TABS tablet Take 1 tablet by mouth every morning.   naloxone (NARCAN) nasal spray 4 mg/0.1 mL Place 1 spray into the nose once as needed (opioid overdose).   pantoprazole (PROTONIX) 40 MG tablet Take 1 tablet (40 mg total) by mouth daily.   potassium chloride (KLOR-CON M) 10 MEQ tablet TAKE 1 TABLET BY MOUTH TWICE DAILY . APPOINTMENT REQUIRED FOR  FUTURE REFILLS   silver sulfADIAZINE (SILVADENE) 1 % cream Apply 1 Application topically daily. Apply to affected area daily plus dry dressing   torsemide (DEMADEX) 20 MG tablet TAKE 1 TABLET EVERY DAY   warfarin (COUMADIN) 5 MG tablet TAKE 1 TABLET EVERY DAY EXCEPT TAKE 1 AND 1/2 TABLETS ON WEDNESDAY AS INSTRUCTED     Allergies  Allergen Reactions   Fentanyl Itching    Social History   Socioeconomic History   Marital status: Married    Spouse name: Not on file   Number of children: 1   Years of education: Not on file   Highest education level: Not on file  Occupational History   Occupation: Pension scheme manager  Tobacco Use   Smoking status: Never   Smokeless tobacco: Never  Vaping Use   Vaping Use: Never used  Substance and Sexual Activity   Alcohol use: Not Currently    Alcohol/week: 21.0 standard drinks of alcohol    Types: 21 Shots of liquor per week   Drug use: No   Sexual activity: Yes  Other Topics Concern   Not on file  Social History Narrative   Not on file   Social Determinants of Health   Financial Resource Strain: Not on file  Food Insecurity: Not on file  Transportation Needs: Not on file  Physical Activity: Not on file  Stress: Not on file  Social Connections: Not on file  Intimate Partner Violence: Not on file     Review of Systems: General: negative for chills, fever, night sweats or weight changes.  Cardiovascular: negative for chest pain, dyspnea on exertion, edema, orthopnea, palpitations, paroxysmal nocturnal dyspnea or shortness of breath Dermatological: negative for rash Respiratory: negative for cough or wheezing Urologic: negative for hematuria Abdominal: negative for nausea, vomiting, diarrhea, bright red blood per rectum, melena, or hematemesis Neurologic: negative for visual changes, syncope, or dizziness All other systems reviewed and are otherwise negative except as noted above.    Blood pressure 120/78, pulse 83, height '6\' 3"'$  (1.905  m), weight 252 lb (114.3 kg).  General appearance: alert and no distress Neck: no adenopathy, no carotid bruit, no JVD, supple, symmetrical, trachea midline, and thyroid not enlarged, symmetric, no tenderness/mass/nodules Lungs: clear to auscultation bilaterally Heart: regular rate and rhythm, S1, S2 normal, no murmur, click, rub or gallop Extremities: extremities normal, atraumatic, no cyanosis or edema Pulses: 2+ and symmetric Skin: Skin color, texture, turgor normal. No rashes or lesions Neurologic: Grossly normal  EKG atrial fibrillation with a ventricular sponsor of 83 and right bundle branch block.  I personally reviewed this EKG.  ASSESSMENT AND PLAN:   Left ventricular dysfunction [name] returns today for early follow-up after his recent 2D echo performed 08/07/2022 revealed a dilated LV with an EF of 25%.  He had biatrial enlargement and trivial MR status post minimally invasive mitral valve repair.  He is on metoprolol and losartan as well as torsemide.  He  is completely asymptomatic.  I am referring him to the advanced heart failure clinic for medication optimization including transitioning to Entresto, carvedilol, spironolactone and an SGLT2 inhibitor.  If his EF does not improve over the next several months he may need to be referred for ICD implantation for primary prevention.     Lorretta Harp MD FACP,FACC,FAHA, [facility] 10/13/2022 2:41 PM

## 2022-10-13 NOTE — Patient Instructions (Signed)
TAKE 2 TABLETS TODAY and then INCREASE TO 1 tablet daily except 1.5 tablets on Wednesdays and Fridays.  [phone]. INR 6 weeks

## 2022-10-13 NOTE — Patient Instructions (Signed)
Medication Instructions:  Your physician recommends that you continue on your current medications as directed. Please refer to the Current Medication list given to you today.  *If you need a refill on your cardiac medications before your next appointment, please call your pharmacy*   Follow-Up: At [facility], you and your health needs are our priority.  As part of our continuing mission to provide you with exceptional heart care, we have created designated Provider Care Teams.  These Care Teams include your primary Cardiologist (physician) and Advanced Practice Providers (APPs -  Physician Assistants and Nurse Practitioners) who all work together to provide you with the care you need, when you need it.  We recommend signing up for the patient portal called "MyChart".  Sign up information is provided on this After Visit Summary.  MyChart is used to connect with patients for Virtual Visits (Telemedicine).  Patients are able to view lab/test results, encounter notes, upcoming appointments, etc.  Non-urgent messages can be sent to your provider as well.   To learn more about what you can do with MyChart, go to [email or web address].    Your next appointment:   6 month(s)  The format for your next appointment:   In Person  Provider:   Jonathan Berry, MD  

## 2022-10-13 NOTE — Assessment & Plan Note (Signed)
[name] returns today for early follow-up after his recent 2D echo performed 08/07/2022 revealed a dilated LV with an EF of 25%.  He had biatrial enlargement and trivial MR status post minimally invasive mitral valve repair.  He is on metoprolol and losartan as well as torsemide.  He is completely asymptomatic.  I am referring him to the advanced heart failure clinic for medication optimization including transitioning to Entresto, carvedilol, spironolactone and an SGLT2 inhibitor.  If his EF does not improve over the next several months he may need to be referred for ICD implantation for primary prevention.

## 2022-10-24 ENCOUNTER — Emergency Department (HOSPITAL_COMMUNITY): Payer: Medicare HMO

## 2022-10-24 ENCOUNTER — Other Ambulatory Visit: Payer: Self-pay

## 2022-10-24 ENCOUNTER — Emergency Department (HOSPITAL_COMMUNITY)
Admission: EM | Admit: 2022-10-24 | Discharge: 2022-10-24 | Disposition: A | Discharge status: Home / Self Care | Payer: Medicare HMO | Attending: Emergency Medicine | Admitting: Emergency Medicine

## 2022-10-24 DIAGNOSIS — M436 Torticollis: Secondary | ICD-10-CM | POA: Diagnosis not present

## 2022-10-24 DIAGNOSIS — I10 Essential (primary) hypertension: Secondary | ICD-10-CM | POA: Diagnosis not present

## 2022-10-24 DIAGNOSIS — Z79899 Other long term (current) drug therapy: Secondary | ICD-10-CM | POA: Diagnosis not present

## 2022-10-24 MED ORDER — KETOROLAC TROMETHAMINE 15 MG/ML IJ SOLN
15.0000 mg | Freq: Once | INTRAMUSCULAR | Status: AC
  Administered 2022-10-24: 15 mg via INTRAMUSCULAR
  Filled 2022-10-24: qty 1

## 2022-10-24 MED ORDER — LIDOCAINE 5 % EX PTCH
1.0000 | MEDICATED_PATCH | CUTANEOUS | Status: DC
  Administered 2022-10-24: 1 via TRANSDERMAL
  Filled 2022-10-24: qty 1

## 2022-10-24 MED ORDER — NAPROXEN 500 MG PO TABS: 500.0000 mg | ORAL_TABLET | Freq: Two times a day (BID) | ORAL | 0 refills | Status: DC

## 2022-10-24 MED ORDER — METHOCARBAMOL 500 MG PO TABS: 500.0000 mg | ORAL_TABLET | Freq: Two times a day (BID) | ORAL | 0 refills | Status: DC

## 2022-10-24 MED ORDER — LIDOCAINE 5 % EX PTCH: 1.0000 | MEDICATED_PATCH | CUTANEOUS | 0 refills | Status: DC

## 2022-10-24 NOTE — Discharge Instructions (Signed)
You came to the emergency department with neck stiffness and pain.  This is called torticollis, read the information about this attached to the discharge papers.  I have sent the following medications to your pharmacy:  Lidocaine patches.  Remember you can only wear the patch for 12 hours at a time Naproxen.  This is a twice daily NSAID medication.  It is not supposed to be taken with Coumadin so be sure to stop taking this around when you get your tooth pulled Methocarbamol.  This is a muscle relaxant.  Please do not drive on this medication as it may make you drowsy.  You also should not drink alcohol with this.  Be careful taking it with your hydrocodone as they both may make you drowsy   Do not hesitate to return to the emergency department with any worsening symptoms.  If you are still having symptoms after the holiday you should get in touch with your orthopedic or neurosurgery offices for reevaluation and potential steroid injections.  It was a pleasure to meet you and we hope you feel better!

## 2022-10-24 NOTE — ED Triage Notes (Signed)
Pt reports neck stiffness radiating to shoulders states can't turn head either way. Denies injury or trauma

## 2022-10-24 NOTE — ED Provider Notes (Signed)
[facility] DEPT Provider Note   CSN: [id number] Arrival date & time: 10/24/22  1327     History  Chief Complaint  Patient presents with   Torticollis    [name] is a 66 y.o. male with a past medical history of hypertension, mitral regurgitation, A-fib, heart failure on Coumadin presenting today with neck stiffness.  He reports that this morning he woke up and can barely turn his head from side-to-side.  He was completely normal when he went to bed last night.  He tried his hydrocodone which did not help his symptoms this morning.  He says that he did not sleep any differently than usual the only thing that he may have done differently is exchanged to car batteries in 1 day however this is not completely out of the norm.  No fever, chills, visual disturbance.  No risk factors for epidural abscess.  HPI     Home Medications Prior to Admission medications   Medication Sig Start Date End Date Taking? Authorizing Provider  ALPRAZolam Duanne Moron) 0.5 MG tablet Take 0.5 mg by mouth 2 (two) times daily. 12/16/21   [provider]  atorvastatin (LIPITOR) 20 MG tablet Take 20 mg by mouth every morning.    [provider]  diphenhydrAMINE (BENADRYL) 2 % cream Apply 1 Application topically 3 (three) times daily as needed (seasonal allergies.).    [provider]  diphenhydrAMINE (BENADRYL) 25 mg capsule Take 25 mg by mouth every 6 (six) hours as needed (seasonal allergies.).    [provider]  docusate sodium (COLACE) 100 MG capsule Take 300 mg by mouth in the morning.    [provider]  ferrous gluconate (FERGON) 324 MG tablet Take 1 tablet (324 mg total) by mouth daily with breakfast. 01/31/22   Oswald Hillock, MD  HYDROcodone-acetaminophen (NORCO/VICODIN) 5-325 MG tablet Take 1 tablet by mouth every 12 (twelve) hours as needed. 07/31/22   Suzan Slick, NP  lidocaine (LIDODERM) 5 % Place 1 patch onto the skin daily.  Remove & Discard patch within 12 hours or as directed by MD 10/24/22  Yes Jaren Vanetten A, PA-C  losartan (COZAAR) 50 MG tablet Take 1 tablet (50 mg total) by mouth daily. 07/24/22   Lorretta Harp, MD  meclizine (ANTIVERT) 25 MG tablet Take 50 mg by mouth in the morning.    [provider]  Melatonin 10 MG TABS Take 10 mg by mouth at bedtime.    [provider]  methocarbamol (ROBAXIN) 500 MG tablet Take 1 tablet (500 mg total) by mouth 2 (two) times daily. 10/24/22  Yes Adanya Sosinski A, PA-C  metoprolol tartrate (LOPRESSOR) 25 MG tablet TAKE 1/2 (ONE-HALF) TABLET BY MOUTH TWICE DAILY . APPOINTMENT MUST KEEP SCHEDULED APPOINTMENT FOR FUTURE REFILLS 05/28/22   Lorretta Harp, MD  Multiple Vitamin (MULTIVITAMIN WITH MINERALS) TABS tablet Take 1 tablet by mouth every morning.    [provider]  naloxone [facility]) nasal spray 4 mg/0.1 mL Place 1 spray into the nose once as needed (opioid overdose). 10/09/21   [provider]  naproxen (NAPROSYN) 500 MG tablet Take 1 tablet (500 mg total) by mouth 2 (two) times daily. 10/24/22  Yes Bejamin Hackbart A, PA-C  pantoprazole (PROTONIX) 40 MG tablet Take 1 tablet (40 mg total) by mouth daily. 02/01/22   Oswald Hillock, MD  potassium chloride (KLOR-CON M) 10 MEQ tablet TAKE 1 TABLET BY MOUTH TWICE DAILY . APPOINTMENT REQUIRED FOR FUTURE REFILLS  04/06/22   Lorretta Harp, MD  silver sulfADIAZINE (SILVADENE) 1 % cream Apply 1 Application topically daily. Apply to affected area daily plus dry dressing 07/07/22   Newt Minion, MD  torsemide (DEMADEX) 20 MG tablet TAKE 1 TABLET EVERY DAY 08/28/22   Lorretta Harp, MD  warfarin (COUMADIN) 5 MG tablet TAKE 1 TABLET EVERY DAY EXCEPT TAKE 1 AND 1/2 TABLETS ON WEDNESDAY AS INSTRUCTED 09/30/22   Lorretta Harp, MD      Allergies    Fentanyl    Review of Systems   Review of Systems  Physical Exam Updated Vital Signs BP (!) 147/105 (BP Location: Right Arm)   Pulse  (!) 101   Temp 99.3 F (37.4 C) (Oral)   Resp 17   Ht '6\' 3"'$  (1.905 m)   Wt 114 kg   SpO2 95%   BMI 31.41 kg/m  Physical Exam Vitals and nursing note reviewed.  Constitutional:      Appearance: Normal appearance.  HENT:     Head: Normocephalic and atraumatic.  Eyes:     General: No scleral icterus.    Conjunctiva/sclera: Conjunctivae normal.  Pulmonary:     Effort: Pulmonary effort is normal. No respiratory distress.  Musculoskeletal:     Cervical back: Tenderness present.     Comments: Full range of motion of patient's bilateral upper extremities.  Sensation intact.  Strong radial pulses and normal strength to finger grip.  Able to rotate his neck from side-to-side to about 30 degrees.  Palpable muscle spasm over the left trapezius and tenderness along patient's left-sided paraspinals  Skin:    Findings: No rash.  Neurological:     Mental Status: He is alert.  Psychiatric:        Mood and Affect: Mood normal.     ED Results / Procedures / Treatments   Labs (all labs ordered are listed, but only abnormal results are displayed) Labs Reviewed - No data to display  EKG None  Radiology CT Cervical Spine Wo Contrast  Result Date: 10/24/2022 CLINICAL DATA:  Pain EXAM: CT CERVICAL SPINE WITHOUT CONTRAST TECHNIQUE: Multidetector CT imaging of the cervical spine was performed without intravenous contrast. Multiplanar CT image reconstructions were also generated. RADIATION DOSE REDUCTION: This exam was performed according to the departmental dose-optimization program which includes automated exposure control, adjustment of the mA and/or kV according to patient size and/or use of iterative reconstruction technique. COMPARISON:  None Available. FINDINGS: Alignment: Normal. Skull base and vertebrae: No acute fracture. No primary bone lesion or focal pathologic process. Soft tissues and spinal canal: No prevertebral fluid or swelling. No visible canal hematoma. Disc levels: Degenerative  disc disease most severely involving C5-6 and C6-7 with loss of disc space and marginal osteophytes. Osteoarthritis identified at C1-C2. Upper chest: Negative. Other: None. IMPRESSION: Degenerative changes.  No acute osseous abnormalities. Electronically Signed   By: Sammie Bench M.D.   On: 10/24/2022 14:35    Procedures Procedures   Medications Ordered in ED Medications  lidocaine (LIDODERM) 5 % 1 patch (has no administration in time range)  ketorolac (TORADOL) 15 MG/ML injection 15 mg (has no administration in time range)    ED Course/ Medical Decision Making/ A&P Clinical Course as of 10/24/22 1543  Sat Oct 24, 2022  1534 CT Cervical Spine Wo Contrast [MR]    Clinical Course User Index [MR] Edouard Gikas A, PA-C  Medical Decision Making Amount and/or Complexity of Data Reviewed Radiology:  Decision-making details documented in ED Course.  Risk Prescription drug management.   66 year old male presenting today with neck pain.  Differential includes but is not limited to torticollis, muscle spasm, vertebral fracture, spondylolithiasis, osteomyelitis, meningitis and demyelinating disease.  This is not exhaustive  Physical exam: Palpable muscle spasm to left trapezius and tenderness to the left cervical paraspinals.  Able to rotate his head to about 30 degrees and limits the remainder secondary to pain.  Passive range of motion is intact.  No midline tenderness.  Normal strength in bilateral upper extremities.  Imaging: CT ordered in triage, viewed and interpreted by me.  There are no abnormalities  Treatment: Lidocaine patch and Toradol shot.  Patient drove to the emergency department and is unable to be given anything stronger  MDM/disposition: 66 year old male presenting with neck stiffness.  Started acutely overnight.  He does not have any fevers or photophobia nor any symptoms or risk factors for meningitis.  He also has not sustained any trauma  that would make me concerned for any spinal cord injuries.  No risk factors for osteo-/discitis.  At this time I suspect patient is suffering from a torticollis that is secondary to his physical activity yesterday and potentially abnormal sleeping position.  He will be given muscle relaxants, NSAIDs which she can only use for the 5 days that he is not on his blood thinner for his upcoming tooth extraction, lidocaine patches and instructions to continue with his at home Vicodin.  He is agreeable to this.  He originally requested intramuscular steroid shots and he was informed that this is something that we will need to be done via his orthopedics or neurosurgeon.  He voiced understanding.  Was discharged in stable condition  Final Clinical Impression(s) / ED Diagnoses Final diagnoses:  Torticollis    Rx / DC Orders ED Discharge Orders          Ordered    lidocaine (LIDODERM) 5 %  Every 24 hours        10/24/22 1540    methocarbamol (ROBAXIN) 500 MG tablet  2 times daily        10/24/22 1540    naproxen (NAPROSYN) 500 MG tablet  2 times daily        10/24/22 1540           Results and diagnoses were explained to the patient. Return precautions discussed in full. Patient had no additional questions and expressed complete understanding.   This chart was dictated using voice recognition software.  Despite best efforts to proofread,  errors can occur which can change the documentation meaning.     Rhae Hammock, PA-C 10/24/22 [place], [place], DO 10/24/22 1846

## 2022-10-24 NOTE — ED Provider Triage Note (Signed)
Emergency Medicine Provider Triage Evaluation Note  [name] , a 66 y.o. male  was evaluated in triage.  Pt complains of neck pain.  States he woke up with severe neck pain.  Is unable to rotate his head to the right or left.  Is able to flex and extend but with significant pain.  States he was lifting some car batteries yesterday which is not abnormal for him.  Slept on his right side like he normally does.  Denies fevers, chills, headaches, vision changes, nausea, vomiting.  Took a hydrocodone approximately 1 hour prior to arrival with no relief  Review of Systems  Positive: As above Negative: As above  Physical Exam  BP (!) 147/105 (BP Location: Right Arm)   Pulse (!) 101   Temp 99.3 F (37.4 C) (Oral)   Resp 17   Ht '6\' 3"'$  (1.905 m)   Wt 114 kg   SpO2 95%   BMI 31.41 kg/m  Gen:   Awake, no distress   Resp:  Normal effort  MSK:   Moves extremities without difficulty  Other:  Exquisite tenderness to palpation of midline C-spine  Medical Decision Making  Medically screening exam initiated at 1:55 PM.  Appropriate orders placed.  Shary Decamp was informed that the remainder of the evaluation will be completed by another provider, this initial triage assessment does not replace that evaluation, and the importance of remaining in the ED until their evaluation is complete.  CT C-spine ordered due to severe tenderness   Roylene Reason, PA-C 10/24/22 1357

## 2022-11-04 ENCOUNTER — Ambulatory Visit: Payer: Medicare HMO | Admitting: Family

## 2022-11-05 ENCOUNTER — Ambulatory Visit (INDEPENDENT_AMBULATORY_CARE_PROVIDER_SITE_OTHER): Payer: Medicare HMO | Admitting: Orthopedic Surgery

## 2022-11-05 DIAGNOSIS — S81802A Unspecified open wound, left lower leg, initial encounter: Secondary | ICD-10-CM | POA: Diagnosis not present

## 2022-11-05 DIAGNOSIS — S81802D Unspecified open wound, left lower leg, subsequent encounter: Secondary | ICD-10-CM

## 2022-11-06 ENCOUNTER — Encounter: Payer: Self-pay | Admitting: Orthopedic Surgery

## 2022-11-06 NOTE — Progress Notes (Signed)
Office Visit Note   Patient: [name]           Date of Birth: 1956-03-25           MRN: [id number] Visit Date: 11/05/2022              Requested by: [name], MD [address] [place],  [place] [id number] PCP: [name], MD  Chief Complaint  Patient presents with   Left Leg - Follow-up    LLE wound       HPI: Patient is a 67 year old gentleman who presents in follow-up for chronic wound left leg status post compartment syndrome.  Patient is currently cleansing with soap and water and using compression sock.  Assessment & Plan: Visit Diagnoses:  1. Leg wound, left, subsequent encounter     Plan: Patient has shown excellent improvement the wound was further debrided reevaluate in 4 weeks for application of Kerecis micro  Follow-Up Instructions: Return in about 4 weeks (around 12/03/2022).   Ortho Exam  Patient is alert, oriented, no adenopathy, well-dressed, normal affect, normal respiratory effort. Examination there is a thin fibrinous exudate over the wound.  The wound has significantly decreased in size.  A dermal Brader pad was used to debride the wound back to bleeding viable granulation tissue the fibrinous tissue was debrided.  4 x 4 and an Ace wrap was applied.  Patient has excellent granulation tissue around the wound edges and good epithelialization of the previous wound.  Imaging: No results found.    Labs: Lab Results  Component Value Date   HGBA1C 5.4 01/29/2022   HGBA1C 5.2 03/16/2019   REPTSTATUS 01/07/2022 FINAL 01/02/2022   REPTSTATUS 01/08/2022 FINAL 01/02/2022   GRAMSTAIN  01/02/2022    FEW WBC PRESENT,BOTH PMN AND MONONUCLEAR RARE GRAM POSITIVE COCCI RARE GRAM NEGATIVE RODS Performed at [facility], [address] [place], [place] [address]    GRAMSTAIN  01/02/2022    FEW WBC PRESENT,BOTH PMN AND MONONUCLEAR NO ORGANISMS SEEN    CULT  01/02/2022    FEW STREPTOCOCCUS GROUP C Beta hemolytic streptococci  are predictably susceptible to penicillin and other beta lactams. Susceptibility testing not routinely performed. ABUNDANT KLEBSIELLA OXYTOCA ABUNDANT STENOTROPHOMONAS MALTOPHILIA    CULT  01/02/2022    NO ANAEROBES ISOLATED Performed at [facility], [place] [address], [place], [place] [address]    [place] OXYTOCA 01/02/2022   LABORGA STENOTROPHOMONAS MALTOPHILIA 01/02/2022     Lab Results  Component Value Date   ALBUMIN 2.5 (L) 01/30/2022   ALBUMIN 3.1 (L) 01/29/2022   ALBUMIN 4.1 09/22/2021    Lab Results  Component Value Date   MG 2.1 01/29/2022   MG 2.1 06/19/2019   MG 2.2 03/25/2019   No results found for: "VD25OH"  No results found for: "PREALBUMIN"    Latest Ref Rng & Units 04/10/2022    8:06 AM 03/18/2022   11:08 AM 02/04/2022    8:39 AM  CBC EXTENDED  WBC 4.0 - 10.5 K/uL 6.0  6.5  8.0   RBC 4.22 - 5.81 MIL/uL 4.02  3.80  3.02   Hemoglobin 13.0 - 17.0 g/dL 9.7  9.1 Repeated and verified X2.  8.4 Repeated and verified X2.   HCT 39.0 - 52.0 % 34.4  30.0  25.8 Repeated and verified X2.   Platelets 150 - 400 K/uL 242  315.0  270.0   NEUT# 1.4 - 7.7 K/uL  4.8    Lymph# 0.7 -  4.0 K/uL  0.9       There is no height or weight on file to calculate BMI.  Orders:  No orders of the defined types were placed in this encounter.  No orders of the defined types were placed in this encounter.    Procedures: No procedures performed  Clinical Data: No additional findings.  ROS:  All other systems negative, except as noted in the HPI. Review of Systems  Objective: Vital Signs: There were no vitals taken for this visit.  Specialty Comments:  No specialty comments available.  PMFS History: Patient Active Problem List   Diagnosis Date Noted   Left ventricular dysfunction 10/13/2022   Rectal bleeding 01/30/2022   Abnormal finding on imaging 01/30/2022   GIB (gastrointestinal bleeding) 01/29/2022   Hyponatremia 01/29/2022   Hypocalcemia  01/29/2022   Alcohol abuse 01/29/2022   AKI (acute kidney injury) ([facility]) 01/29/2022   Symptomatic anemia 01/29/2022   Venous stasis dermatitis of both lower extremities    Bleeding on Coumadin    Leg wound, left, subsequent encounter 12/30/2021   Compartment syndrome of left lower extremity ([facility]) 09/22/2021   Status post surgery 09/22/2021   Compartment syndrome of left upper extremity ([place]) 09/22/2021   Hyperlipidemia 12/13/2020   Atrial fibrillation ([place]) 06/10/2020   Long term (current) use of anticoagulants 06/10/2020   Secondary hypercoagulable state ([place]) 04/24/2020   AV junctional bradycardia 03/22/2019   S/P minimally invasive mitral valve repair  03/21/2019   S/P Maze operation for atrial fibrillation 03/21/2019   Dilated aortic root ([place]) 01/04/2019   Dilated cardiomyopathy ([facility]) 01/04/2019   Atrial flutter with rapid ventricular response ([place])    Mitral valve prolapse    Hypertensive urgency 12/30/2018   Acute on chronic systolic (congestive) heart failure ([place]) 60/45/4098   Acute systolic heart failure ([facility]) 11/91/4782   Chronic systolic (congestive) heart failure ([facility])    Persistent atrial fibrillation ([place]) 12/02/2018   Right hamstring muscle strain 07/20/2018   Essential hypertension 02/07/2016   Severe mitral regurgitation    Annual physical exam 11/16/2014   Arthritis of left knee 10/23/2014   Status post total left knee replacement 10/23/2014   Arthritis of knee, right 01/19/2014   Status post total knee replacement 01/19/2014   Benign paroxysmal positional vertigo 10/30/2013   Testicular hypofunction 03/24/2012   Past Medical History:  Diagnosis Date   Acute on chronic systolic (congestive) heart failure ([place] [place]) 12/30/2018   Allergy    Arthritis    Atrial flutter with rapid ventricular response (H[facility])    Chronic systolic (congestive) heart failure ([place])    COVID 10/2021   mild   Dilated aortic root ([place]) 01/04/2019   Dilated cardiomyopathy ([place])  01/04/2019   Dysrhythmia    Heart murmur    History of blood transfusion    01/27/22   History of colon polyps    Hypertension    Insomnia    Mitral valve prolapse    Mitral valve regurgitation    Persistent atrial fibrillation ([place]) 12/02/2018   S/P Maze operation for atrial fibrillation 03/21/2019   Complete bilateral atrial lesion set using cryothermy and bipolar radiofrequency ablation with clipping of LA appendage via right mini thoracotomy approach   S/P minimally invasive mitral valve repair 03/21/2019   Complex valvuloplasty including triangular resection of posterior leaflet, artificial Gore-tex neochord placement x4 and 36 mm Sorin Memo 4D ring annuloplasty via right mini thoracotomy approach   Seizures ([place])    had one approx. 30 yrs ago,has not had  any since; due to alchol and no sleep   Severe mitral regurgitation     Family History  Problem Relation Age of Onset   Hypertension Father    Colon cancer Father        dx in his late 81's   Diabetes Mother    Stomach cancer Neg Hx    Esophageal cancer Neg Hx    Pancreatic cancer Neg Hx    Rectal cancer Neg Hx     Past Surgical History:  Procedure Laterality Date   ANKLE FUSION  left   Dec. 4098   APPLICATION OF WOUND VAC Left 09/22/2021   Procedure: APPLICATION OF WOUND VAC;  Surgeon: Mcarthur Rossetti, MD;  Location: [facility];  Service: Orthopedics;  Laterality: Left;   APPLICATION OF WOUND VAC Left 12/30/2021   Procedure: APPLICATION OF WOUND VAC;  Surgeon: Mcarthur Rossetti, MD;  Location: [facility];  Service: Orthopedics;  Laterality: Left;   APPLICATION OF WOUND VAC Left 04/10/2022   Procedure: APPLICATION OF WOUND VAC;  Surgeon: Newt Minion, MD;  Location: [facility];  Service: Orthopedics;  Laterality: Left;   ARTHROSCOPY KNEE W/ DRILLING  bilateral   2012   BIOPSY  01/30/2022   Procedure: BIOPSY;  Surgeon: Rush Landmark Telford Nab., MD;  Location: [facility];  Service: Gastroenterology;;   CARDIAC VALVE  REPLACEMENT     CARDIOVERSION N/A 01/03/2019   Procedure: CARDIOVERSION;  Surgeon: Sueanne Margarita, MD;  Location: [facility];  Service: Cardiovascular;  Laterality: N/A;   CARDIOVERSION N/A 06/23/2019   Procedure: CARDIOVERSION;  Surgeon: Fay Records, MD;  Location: [facility];  Service: Cardiovascular;  Laterality: N/A;   CLIPPING OF ATRIAL APPENDAGE  03/21/2019   Procedure: Clipping Of Atrial Appendage using 60m Atricure Pro2 Clip;  Surgeon: ORexene Alberts MD;  [place] [facility]  Service: Open Heart Surgery;;   COLONOSCOPY     x2   COLONOSCOPY N/A 01/30/2022   Procedure: COLONOSCOPY;  Surgeon: MIrving Copas, MD;  [facility];  Service: Gastroenterology;  Laterality: N/A;   ESOPHAGOGASTRODUODENOSCOPY N/A 01/30/2022   Procedure: ESOPHAGOGASTRODUODENOSCOPY (EGD);  Surgeon: MIrving Copas, MD;  [facility];  Service: Gastroenterology;  Laterality: N/A;   FOOT ARTHRODESIS  06/23/2012   Procedure: ARTHRODESIS FOOT;  Surgeon: JWylene Simmer MD;  [place] [facility]  Service: Orthopedics;  Laterality: Left;  Left Subtalar and Talonavicular Joint Revision Arthrodesis  Aspiration of Bone Marrow from Left Hip    HAIR TRANSPLANT     HARDWARE REMOVAL  06/23/2012   Procedure: HARDWARE REMOVAL;  Surgeon: JWylene Simmer MD;  [place] [facility]  Service: Orthopedics;  Laterality: Left;  Removal of Deep Implant  X's 3   I & D EXTREMITY Left 09/23/2021   Procedure: IRRIGATION AND DEBRIDEMENT OF LEG;  Surgeon: BMcarthur Rossetti MD;  [place] [facility]  Service: Orthopedics;  Laterality: Left;   I & D EXTREMITY Left 09/26/2021   Procedure: REPEAT IRRIGATION AND DEBRIDEMENT LEFT LEG, POSSIBLE WOUND CLOSURE, POSSIBLE VAC CHANGE, POSSIBLE SKIN GRAFT;  Surgeon: BMcarthur Rossetti MD;  [place] [facility]  Service: Orthopedics;  Laterality: Left;   I & D EXTREMITY Left 12/30/2021   Procedure: IRRIGATION AND DEBRIDEMENT LEFT LOWER LEG WOUND;  Surgeon: BMcarthur Rossetti  MD;  [place] [facility]  Service: Orthopedics;  Laterality: Left;   I & D EXTREMITY Left 01/02/2022   Procedure: LEFT LEG DEBRIDEMENT AND TISSUE GRAFT;  Surgeon: DNewt Minion MD;  [place] [facility]  Service: Orthopedics;  Laterality: Left;   INCISION AND DRAINAGE WOUND WITH FASCIOTOMY Left 09/22/2021   Procedure: INCISION AND DRAINAGE WOUND WITH FASCIOTOMY;  Surgeon: Mcarthur Rossetti, MD;  [place] [facility];  Service: Orthopedics;  Laterality: Left;   INSERTION OF MESH N/A 07/10/2016   Procedure: INSERTION OF MESH;  Surgeon: Coralie Keens, MD;  Location: [facility];  Service: General;  Laterality: N/A;   JOINT REPLACEMENT     right hip  01-2011   LEFT HEART CATH AND CORONARY ANGIOGRAPHY N/A 03/16/2019   Procedure: LEFT HEART CATH AND CORONARY ANGIOGRAPHY;  Surgeon: Sherren Mocha, MD;  Location: [facility];  Service: Cardiovascular;  Laterality: N/A;   LIMB SPARING RESECTION HIP W/ SADDLE JOINT REPLACEMENT Right    MINIMALLY INVASIVE MAZE PROCEDURE N/A 03/21/2019   Procedure: MINIMALLY INVASIVE MAZE PROCEDURE;  Surgeon: Rexene Alberts, MD;  Location: [facility];  Service: Open Heart Surgery;  Laterality: N/A;   MITRAL VALVE REPAIR Right 03/21/2019   Procedure: MINIMALLY INVASIVE MITRAL VALVE REPAIR (MVR) using Memo 4D ring size 36;  Surgeon: Rexene Alberts, MD;  Location: [facility];  Service: Open Heart Surgery;  Laterality: Right;   POLYPECTOMY  01/30/2022   Procedure: POLYPECTOMY;  Surgeon: Mansouraty, Telford Nab., MD;  Location[facility];  Service: Gastroenterology;;   SKIN SPLIT GRAFT Left 04/10/2022   Procedure: APPLY SKIN GRAFT TO LEFT LEG WOUND;  Surgeon: Newt Minion, MD;  Location: [facility];  Service: Orthopedics;  Laterality: Left;   TEE WITHOUT CARDIOVERSION N/A 01/03/2019   Procedure: TRANSESOPHAGEAL ECHOCARDIOGRAM (TEE);  Surgeon: Sueanne Margarita, MD;  Location: [facility];  Service: Cardiovascular;  Laterality: N/A;   TEE WITHOUT CARDIOVERSION N/A 03/21/2019    Procedure: TRANSESOPHAGEAL ECHOCARDIOGRAM (TEE);  Surgeon: Rexene Alberts, MD;  Location: [facility];  Service: Open Heart Surgery;  Laterality: N/A;   TEMPORARY PACEMAKER N/A 03/22/2019   Procedure: TEMPORARY PACEMAKER;  Surgeon: Belva Crome, MD;  Location: [facility];  Service: Cardiovascular;  Laterality: N/A;   TOTAL KNEE ARTHROPLASTY Right 01/19/2014   Procedure: RIGHT TOTAL KNEE ARTHROPLASTY, Steroid injection left knee;  Surgeon: Mcarthur Rossetti, MD;  [facility];  Service: Orthopedics;  Laterality: Right;   TOTAL KNEE ARTHROPLASTY Left 10/23/2014   Procedure: LEFT TOTAL KNEE ARTHROPLASTY;  Surgeon: Mcarthur Rossetti, MD;  [facility];  Service: Orthopedics;  Laterality: Left;   UMBILICAL HERNIA REPAIR N/A 07/10/2016   Procedure: UMBILICAL HERNIA REPAIR;  Surgeon: Coralie Keens, MD;  Location: [facility];  Service: General;  Laterality: N/A;   Social History   Occupational History   Occupation: Pension scheme manager  Tobacco Use   Smoking status: Never   Smokeless tobacco: Never  Vaping Use   Vaping Use: Never used  Substance and Sexual Activity   Alcohol use: Not Currently    Alcohol/week: 21.0 standard drinks of alcohol    Types: 21 Shots of liquor per week   Drug use: No   Sexual activity: Yes

## 2022-11-20 ENCOUNTER — Other Ambulatory Visit (HOSPITAL_COMMUNITY): Payer: Self-pay

## 2022-11-20 ENCOUNTER — Telehealth (HOSPITAL_COMMUNITY): Payer: Self-pay

## 2022-11-20 ENCOUNTER — Ambulatory Visit (HOSPITAL_COMMUNITY)
Admission: RE | Admit: 2022-11-20 | Discharge: 2022-11-20 | Disposition: A | Discharge status: Home / Self Care | Payer: Medicare HMO | Source: Ambulatory Visit | Attending: Cardiology | Admitting: Cardiology

## 2022-11-20 VITALS — BP 110/70 | HR 117 | Ht 75.0 in | Wt 257.6 lb

## 2022-11-20 DIAGNOSIS — Z7901 Long term (current) use of anticoagulants: Secondary | ICD-10-CM | POA: Insufficient documentation

## 2022-11-20 DIAGNOSIS — I110 Hypertensive heart disease with heart failure: Secondary | ICD-10-CM | POA: Diagnosis present

## 2022-11-20 DIAGNOSIS — I4891 Unspecified atrial fibrillation: Secondary | ICD-10-CM

## 2022-11-20 DIAGNOSIS — Z79899 Other long term (current) drug therapy: Secondary | ICD-10-CM | POA: Diagnosis not present

## 2022-11-20 DIAGNOSIS — I4892 Unspecified atrial flutter: Secondary | ICD-10-CM | POA: Diagnosis not present

## 2022-11-20 DIAGNOSIS — I4819 Other persistent atrial fibrillation: Secondary | ICD-10-CM | POA: Diagnosis not present

## 2022-11-20 DIAGNOSIS — I5022 Chronic systolic (congestive) heart failure: Secondary | ICD-10-CM

## 2022-11-20 LAB — CBC
HCT: 46.7 % (ref 39.0–52.0)
Hemoglobin: 15.1 g/dL (ref 13.0–17.0)
MCH: 31.5 pg (ref 26.0–34.0)
MCHC: 32.3 g/dL (ref 30.0–36.0)
MCV: 97.3 fL (ref 80.0–100.0)
Platelets: 187 10*3/uL (ref 150–400)
RBC: 4.8 MIL/uL (ref 4.22–5.81)
RDW: 13.4 % (ref 11.5–15.5)
WBC: 6.7 10*3/uL (ref 4.0–10.5)
nRBC: 0 % (ref 0.0–0.2)

## 2022-11-20 LAB — COMPREHENSIVE METABOLIC PANEL
ALT: 24 U/L (ref 0–44)
AST: 19 U/L (ref 15–41)
Albumin: 4 g/dL (ref 3.5–5.0)
Alkaline Phosphatase: 44 U/L (ref 38–126)
Anion gap: 9 (ref 5–15)
BUN: 22 mg/dL (ref 8–23)
CO2: 28 mmol/L (ref 22–32)
Calcium: 8.9 mg/dL (ref 8.9–10.3)
Chloride: 102 mmol/L (ref 98–111)
Creatinine, Ser: 1.08 mg/dL (ref 0.61–1.24)
GFR, Estimated: >60 mL/min (ref >60)
Glucose, Bld: 96 mg/dL (ref 70–99)
Potassium: 3.9 mmol/L (ref 3.5–5.1)
Sodium: 139 mmol/L (ref 135–145)
Total Bilirubin: 1.2 mg/dL (ref 0.3–1.2)
Total Protein: 6.7 g/dL (ref 6.5–8.1)

## 2022-11-20 LAB — TSH: TSH: 1.111 u[IU]/mL (ref 0.350–4.500)

## 2022-11-20 LAB — PROTIME-INR
INR: 2.1 — ABNORMAL HIGH (ref 0.8–1.2)
Prothrombin Time: 23.2 seconds — ABNORMAL HIGH (ref 11.4–15.2)

## 2022-11-20 LAB — BRAIN NATRIURETIC PEPTIDE: B Natriuretic Peptide: 388.9 pg/mL — ABNORMAL HIGH (ref 0.0–100.0)

## 2022-11-20 MED ORDER — METOPROLOL SUCCINATE ER 25 MG PO TB24: 25.0000 mg | ORAL_TABLET | Freq: Every day | ORAL | 3 refills | Status: DC

## 2022-11-20 MED ORDER — AMIODARONE HCL 200 MG PO TABS: ORAL_TABLET | ORAL | 3 refills | Status: DC

## 2022-11-20 MED ORDER — ENTRESTO 24-26 MG PO TABS: 1.0000 | ORAL_TABLET | Freq: Two times a day (BID) | ORAL | 11 refills | Status: DC

## 2022-11-20 NOTE — Progress Notes (Signed)
Medication Samples have been provided to the patient.  Drug name: Delene Loll        Strength: 24/26 mg        Qty: 2  LOT: [id number]  Exp.Date: 01/2025  Dosing instructions: Take 1 tablet Twice daily   The patient has been instructed regarding the correct time, dose, and frequency of taking this medication, including desired effects and most common side effects.   [name] [name] 12:16 PM 11/20/2022

## 2022-11-20 NOTE — Telephone Encounter (Signed)
Advanced Heart Failure Patient Advocate Encounter  The patient was approved for a [place] that will help cover the cost of Entresto.  Total amount awarded, $10,000.  Effective: 10/21/22 - 10/21/23.  BIN [id number] PCN [id number] Group [id number] ID [id number]  Patient provided with approval and processing information in office.  Clista Bernhardt, CPhT Rx Patient Advocate Phone: [phone]

## 2022-11-20 NOTE — Patient Instructions (Signed)
STOP Losartan.  STOP Metoprolol  START Toprol XL 25 mg daily.  START Amiodarone '200mg'$  Twice daily for 2 weeks, then go to 200 mg daily.  START Entresto 24/26 mg Twice daily  Labs done today, your results will be available in MyChart, we will contact you for abnormal readings.  You are scheduled for a TEE (Transesophageal Echocardiogram) Guided Cardioversion on Friday, February 9 with Dr. Aundra Dubin.  Please arrive at the [facility] (Main Entrance A) at [facility]: [address] [place], [place] [address] at 11:00 AM.   DIET:  Nothing to eat or drink after midnight except a sip of water with medications (see medication instructions below)  MEDICATION INSTRUCTIONS: HOLD TORSEMIDE THE MORNING OF YOUR PROCEDURE 12/11/22       Continue taking your anticoagulant (blood thinner): Warfarin (Coumadin).  You will need to continue this after your procedure until you are told by your provider that it is safe to stop.     FYI:  For your safety, and to allow [place] to monitor your vital signs accurately during the surgery/procedure we request: If you have artificial nails, gel coating, SNS etc, please have those removed prior to your surgery/procedure. Not having the nail coverings /polish removed may result in cancellation or delay of your surgery/procedure.  You must have a responsible person to drive you home and stay in the waiting area during your procedure. Failure to do so could result in cancellation.  Bring your insurance cards.  *Special Note: Every effort is made to have your procedure done on time. Occasionally there are emergencies that occur at the hospital that may cause delays. Please be patient if a delay does occur.    Your physician recommends that you schedule a follow-up appointment in: 1 Month  If you have any questions or concerns before your next appointment please send [place] a message through [place] or call our office at [phone].    TO LEAVE A MESSAGE FOR THE  NURSE SELECT OPTION 2, PLEASE LEAVE A MESSAGE INCLUDING: YOUR NAME DATE OF BIRTH CALL BACK NUMBER REASON FOR CALL**this is important as we prioritize the call backs  YOU WILL RECEIVE A CALL BACK THE SAME DAY AS LONG AS YOU CALL BEFORE 4:00 PM  At the [facility], you and your health needs are our priority. As part of our continuing mission to provide you with exceptional heart care, we have created designated Provider Care Teams. These Care Teams include your primary Cardiologist (physician) and Advanced Practice Providers (APPs- Physician Assistants and Nurse Practitioners) who all work together to provide you with the care you need, when you need it.   You may see any of the following providers on your designated Care Team at your next follow up: Dr Glori Bickers Dr [name] Dr. Roxana Hires, NP Lyda Jester, [place] [facility] [place], [place] Forestine Na, NP Audry Riles, PharmD   Please be sure to bring in all your medications bottles to every appointment.

## 2022-11-21 NOTE — H&P (View-Only) (Signed)
PCP: [name], MD Cardiology: Dr. Gwenlyn Found HF Cardiology: Dr. Aundra Dubin   67 y.o. with history of persistent atrial fibrillation and CHF was referred by Dr. Gwenlyn Found for evaluation of CHF.  Patient was noted in 2020 to be in atrial fibrillation.  Echo showed EF 35-40% with severe MR in the setting of partial flail posterior leaflet and ruptured chord.  In 5/20, he had minimally invasive mitral valve repair with Maze and LA appendage clipping.  Pre-op cath had shown no significant coronary disease.  Post-op, he remained in atrial fibrillation and had DCCV to NSR in 8/20. Echoes in 7/20 and 9/21 showed EF improved to 55-60%.  However, echo in 10/22 showed EF down to 45-50%.  The patient was noted to be in atrial fibrillation with RVR at that time. Reviewing ECGs, it looks like he has been persistently in atrial fibrillation or atypical flutter with RVR in the 110s since at least 10/22.  Most recent echo in 10/23 showed EF 25%, global hypokinesis, moderate RV dysfunction, s/p MV repair with no MR and no significant stenosis. The patient was still in atrial fibrillation.   Patient is in atypical atrial flutter today with rate 110s.  His HR appears to have been chronically > 100.  He feels like he has less energy than in the past but is not particularly symptomatic. He does not feel palpitations. No significant exertional dyspnea. No lightheadedness.  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   ECG (personally reviewed): atypical atrial flutter rate 116, RBBB, LAFB  Labs (6/23): K 4.2, creatinine 0.94  PMH: 1. HTN 2. Atrial fibrillation: Persistent. Noted initially in setting of severe MR.  Had Maze with MV repair in 5/20.  - DCCV to NSR in 8/20.  - Noted to be in atrial fibrillation on 10/22 echo.  3. Mitral regurgitation: Partial flail posterior leaflet with ruptured chord.  - Minimally invasive MV repair with maze and LA appendage clipping in  5/20.  - Echo in 10/23 with stable MV repair with no MR or MS.   4. LHC (5/20) with normal coronaries.  5. Chronic systolic CHF: Suspect tachycardia-mediated. - Echo (2/20) pre-MV repair in setting of AF: EF 35-40%, moderate RV dysfunction, severe MR.  - Echo (7/20) post-MV repair: EF 55-60%, normal RV, s/p MV repair with mild MR.  - Echo (9/21): EF 55-60%, s/p MV repair with no MR, mean gradient 5.  - Echo (10/22): EF 45-50%, patient was in atrial fibrillation.  - Echo (10/23): EF 25%, global hypokinesis, moderate RV dysfunction, s/p MV repair with no MR and no significant stenosis. The patient was in atrial fibrillation.  6. Chronic left leg wound: s/p skin grafts.   Social History   Socioeconomic History   Marital status: Married    Spouse name: Not on file   Number of children: 1   Years of education: Not on file   Highest education level: Not on file  Occupational History   Occupation: Pension scheme manager  Tobacco Use   Smoking status: Never   Smokeless tobacco: Never  Vaping Use   Vaping Use: Never used  Substance and Sexual Activity   Alcohol use: Not Currently    Alcohol/week: 21.0 standard drinks of alcohol    Types: 21 Shots of liquor per week   Drug use: No   Sexual activity: Yes  Other Topics Concern   Not on file  Social History Narrative   Not on file  Social Determinants of Health   Financial Resource Strain: Not on file  Food Insecurity: Not on file  Transportation Needs: Not on file  Physical Activity: Not on file  Stress: Not on file  Social Connections: Not on file  Intimate Partner Violence: Not on file   Family History  Problem Relation Age of Onset   Hypertension Father    Colon cancer Father        dx in his late 63's   Diabetes Mother    Stomach cancer Neg Hx    Esophageal cancer Neg Hx    Pancreatic cancer Neg Hx    Rectal cancer Neg Hx    ROS: All systems reviewed and negative except as per HPI.  Current Outpatient Medications   Medication Sig Dispense Refill   ALPRAZolam (XANAX) 0.5 MG tablet Take 0.5 mg by mouth 2 (two) times daily.     amiodarone (PACERONE) 200 MG tablet Take 1 tablet (200 mg total) by mouth 2 (two) times daily for 14 days, THEN 1 tablet (200 mg total) daily. 90 tablet 3   atorvastatin (LIPITOR) 20 MG tablet Take 20 mg by mouth every morning.     diphenhydrAMINE (BENADRYL) 2 % cream Apply 1 Application topically 3 (three) times daily as needed (seasonal allergies.).     diphenhydrAMINE (BENADRYL) 25 mg capsule Take 25 mg by mouth every 6 (six) hours as needed (seasonal allergies.).     Docusate Sodium (DSS) 100 MG CAPS Take 3 capsules by mouth every morning.     ferrous gluconate (FERGON) 324 MG tablet Take 1 tablet (324 mg total) by mouth daily with breakfast. 30 tablet 3   HYDROcodone-acetaminophen (NORCO) 10-325 MG tablet Take 1 tablet by mouth every 8 (eight) hours.     HYDROcodone-acetaminophen (NORCO/VICODIN) 5-325 MG tablet Take 1 tablet by mouth every 12 (twelve) hours as needed. 20 tablet 0   lidocaine (LIDODERM) 5 % Place 1 patch onto the skin daily. Remove & Discard patch within 12 hours or as directed by MD 30 patch 0   meclizine (ANTIVERT) 25 MG tablet Take 50 mg by mouth as needed.     Melatonin 10 MG TABS Take 10 mg by mouth at bedtime.     metoprolol succinate (TOPROL XL) 25 MG 24 hr tablet Take 1 tablet (25 mg total) by mouth daily. 90 tablet 3   Multiple Vitamin (MULTIVITAMIN WITH MINERALS) TABS tablet Take 1 tablet by mouth every morning.     naloxone (NARCAN) nasal spray 4 mg/0.1 mL Place 1 spray into the nose once as needed (opioid overdose).     potassium chloride (KLOR-CON M) 10 MEQ tablet TAKE 1 TABLET BY MOUTH TWICE DAILY . APPOINTMENT REQUIRED FOR FUTURE REFILLS 90 tablet 0   sacubitril-valsartan (ENTRESTO) 24-26 MG Take 1 tablet by mouth 2 (two) times daily. 60 tablet 11   silver sulfADIAZINE (SILVADENE) 1 % cream Apply 1 Application topically daily. Apply to affected area  daily plus dry dressing 400 g 3   torsemide (DEMADEX) 20 MG tablet TAKE 1 TABLET EVERY DAY 90 tablet 3   warfarin (COUMADIN) 5 MG tablet TAKE 1 TABLET EVERY DAY EXCEPT TAKE 1 AND 1/2 TABLETS ON WEDNESDAY AS INSTRUCTED 100 tablet 1   No current facility-administered medications for this encounter.   BP 110/70   Pulse (!) 117   Ht 6' 3"$  (1.905 m)   Wt 116.8 kg (257 lb 9.6 oz)   SpO2 96%   BMI 32.20 kg/m  General: NAD Neck: JVP  8 cm with HJR, no thyromegaly or thyroid nodule.  Lungs: Clear to auscultation bilaterally with normal respiratory effort. CV: Nondisplaced PMI.  Heart tachy, regular S1/S2, no S3/S4, no murmur.  1+ edema 1/2 to knees bilaterally.  No carotid bruit.  Normal pedal pulses.  Abdomen: Soft, nontender, no hepatosplenomegaly, no distention.  Skin: Intact without lesions or rashes.  Neurologic: Alert and oriented x 3.  Psych: Normal affect. Extremities: No clubbing or cyanosis.  HEENT: Normal.   Assessment/Plan: 1. Chronic systolic CHF:  Echoes in [id number] and 9/21, presumably in NSR, showed EF 55-60%.  Echo in 10/22 showed EF down to 45-50%, he was in atrial fibrillation at that time.  Echo in 10/23, also in AF or AFL, showed EF 25%, global hypokinesis, moderate RV dysfunction, s/p MV repair with no MR and no significant stenosis. Patient had a cath with no significant coronary disease in 5/20.  His mitral valve repair appeared intact on last echo.  I suspect that the fall in EF may be due to persistent AF/AFL with RVR (tachycardia-mediated cardiomyopathy); it appears his HR has been > 100 chronically.  He is not particularly symptomatic, NYHA class II, but he does appear to have mild volume overload on exam.  - We need to get him back into NSR, see below.  - Stop losartan, start Entresto 24/26 bid.  This should allow some additional diuresis.  BMET/BNP today and again in 10 days.  - Stop metoprolol tartrate and start Toprol XL 25 mg daily.  - Continue torsemide 20 mg daily  and KCl 10 bid.  2. Atrial fibrillation/atypical AFL: H/o Maze and LA appendage ligation with MV repair in 2020.  Patient has had recurrent atrial arrhythmias.  Patient has been noted to have both AF and atypical AFL, he is in atypical AFL today. HR has been chronically > 100, concern for tachycardia-mediated cardiomyopathy.  We need to get him back into NSR and keep him in NSR.  - Start amiodarone 200 mg bid x 2 wks then 200 mg daily.  - Continue warfarin.  - I will arrange for TEE-guided DCCV after he has been on amiodarone for 10-14 days.  His INR has not been therapeutic recently, will need to check today.  We discussed risks/benefits of procedure and he agrees to it.  - Will need to eventually refer to EP for ablation consideration for long-term management.  3. H/o MV repair: Mitral valve repair appeared stable on 10/23 echo with no significant MR or MS.  - Needs antibiotic prophylaxis with dental work.   Followup in 1 month.   [name] 11/22/2022

## 2022-11-21 NOTE — Progress Notes (Signed)
PCP: Bouska, David, MD Cardiology: Dr. Berry HF Cardiology: Dr. Gamble Enderle   67 y.o. with history of persistent atrial fibrillation and CHF was referred by Dr. Berry for evaluation of CHF.  Patient was noted in 2020 to be in atrial fibrillation.  Echo showed EF 35-40% with severe MR in the setting of partial flail posterior leaflet and ruptured chord.  In 5/20, he had minimally invasive mitral valve repair with Maze and LA appendage clipping.  Pre-op cath had shown no significant coronary disease.  Post-op, he remained in atrial fibrillation and had DCCV to NSR in 8/20. Echoes in 7/20 and 9/21 showed EF improved to 55-60%.  However, echo in 10/22 showed EF down to 45-50%.  The patient was noted to be in atrial fibrillation with RVR at that time. Reviewing ECGs, it looks like he has been persistently in atrial fibrillation or atypical flutter with RVR in the 110s since at least 10/22.  Most recent echo in 10/23 showed EF 25%, global hypokinesis, moderate RV dysfunction, s/p MV repair with no MR and no significant stenosis. The patient was still in atrial fibrillation.   Patient is in atypical atrial flutter today with rate 110s.  His HR appears to have been chronically > 100.  He feels like he has less energy than in the past but is not particularly symptomatic. He does not feel palpitations. No significant exertional dyspnea. No lightheadedness.  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   ECG (personally reviewed): atypical atrial flutter rate 116, RBBB, LAFB  Labs (6/23): K 4.2, creatinine 0.94  PMH: 1. HTN 2. Atrial fibrillation: Persistent. Noted initially in setting of severe MR.  Had Maze with MV repair in 5/20.  - DCCV to NSR in 8/20.  - Noted to be in atrial fibrillation on 10/22 echo.  3. Mitral regurgitation: Partial flail posterior leaflet with ruptured chord.  - Minimally invasive MV repair with maze and LA appendage clipping in  5/20.  - Echo in 10/23 with stable MV repair with no MR or MS.   4. LHC (5/20) with normal coronaries.  5. Chronic systolic CHF: Suspect tachycardia-mediated. - Echo (2/20) pre-MV repair in setting of AF: EF 35-40%, moderate RV dysfunction, severe MR.  - Echo (7/20) post-MV repair: EF 55-60%, normal RV, s/p MV repair with mild MR.  - Echo (9/21): EF 55-60%, s/p MV repair with no MR, mean gradient 5.  - Echo (10/22): EF 45-50%, patient was in atrial fibrillation.  - Echo (10/23): EF 25%, global hypokinesis, moderate RV dysfunction, s/p MV repair with no MR and no significant stenosis. The patient was in atrial fibrillation.  6. Chronic left leg wound: s/p skin grafts.   Social History   Socioeconomic History   Marital status: Married    Spouse name: Not on file   Number of children: 1   Years of education: Not on file   Highest education level: Not on file  Occupational History   Occupation: car mechanic  Tobacco Use   Smoking status: Never   Smokeless tobacco: Never  Vaping Use   Vaping Use: Never used  Substance and Sexual Activity   Alcohol use: Not Currently    Alcohol/week: 21.0 standard drinks of alcohol    Types: 21 Shots of liquor per week   Drug use: No   Sexual activity: Yes  Other Topics Concern   Not on file  Social History Narrative   Not on file     Social Determinants of Health   Financial Resource Strain: Not on file  Food Insecurity: Not on file  Transportation Needs: Not on file  Physical Activity: Not on file  Stress: Not on file  Social Connections: Not on file  Intimate Partner Violence: Not on file   Family History  Problem Relation Age of Onset   Hypertension Father    Colon cancer Father        dx in his late 70's   Diabetes Mother    Stomach cancer Neg Hx    Esophageal cancer Neg Hx    Pancreatic cancer Neg Hx    Rectal cancer Neg Hx    ROS: All systems reviewed and negative except as per HPI.  Current Outpatient Medications   Medication Sig Dispense Refill   ALPRAZolam (XANAX) 0.5 MG tablet Take 0.5 mg by mouth 2 (two) times daily.     amiodarone (PACERONE) 200 MG tablet Take 1 tablet (200 mg total) by mouth 2 (two) times daily for 14 days, THEN 1 tablet (200 mg total) daily. 90 tablet 3   atorvastatin (LIPITOR) 20 MG tablet Take 20 mg by mouth every morning.     diphenhydrAMINE (BENADRYL) 2 % cream Apply 1 Application topically 3 (three) times daily as needed (seasonal allergies.).     diphenhydrAMINE (BENADRYL) 25 mg capsule Take 25 mg by mouth every 6 (six) hours as needed (seasonal allergies.).     Docusate Sodium (DSS) 100 MG CAPS Take 3 capsules by mouth every morning.     ferrous gluconate (FERGON) 324 MG tablet Take 1 tablet (324 mg total) by mouth daily with breakfast. 30 tablet 3   HYDROcodone-acetaminophen (NORCO) 10-325 MG tablet Take 1 tablet by mouth every 8 (eight) hours.     HYDROcodone-acetaminophen (NORCO/VICODIN) 5-325 MG tablet Take 1 tablet by mouth every 12 (twelve) hours as needed. 20 tablet 0   lidocaine (LIDODERM) 5 % Place 1 patch onto the skin daily. Remove & Discard patch within 12 hours or as directed by MD 30 patch 0   meclizine (ANTIVERT) 25 MG tablet Take 50 mg by mouth as needed.     Melatonin 10 MG TABS Take 10 mg by mouth at bedtime.     metoprolol succinate (TOPROL XL) 25 MG 24 hr tablet Take 1 tablet (25 mg total) by mouth daily. 90 tablet 3   Multiple Vitamin (MULTIVITAMIN WITH MINERALS) TABS tablet Take 1 tablet by mouth every morning.     naloxone (NARCAN) nasal spray 4 mg/0.1 mL Place 1 spray into the nose once as needed (opioid overdose).     potassium chloride (KLOR-CON M) 10 MEQ tablet TAKE 1 TABLET BY MOUTH TWICE DAILY . APPOINTMENT REQUIRED FOR FUTURE REFILLS 90 tablet 0   sacubitril-valsartan (ENTRESTO) 24-26 MG Take 1 tablet by mouth 2 (two) times daily. 60 tablet 11   silver sulfADIAZINE (SILVADENE) 1 % cream Apply 1 Application topically daily. Apply to affected area  daily plus dry dressing 400 g 3   torsemide (DEMADEX) 20 MG tablet TAKE 1 TABLET EVERY DAY 90 tablet 3   warfarin (COUMADIN) 5 MG tablet TAKE 1 TABLET EVERY DAY EXCEPT TAKE 1 AND 1/2 TABLETS ON WEDNESDAY AS INSTRUCTED 100 tablet 1   No current facility-administered medications for this encounter.   BP 110/70   Pulse (!) 117   Ht 6' 3" (1.905 m)   Wt 116.8 kg (257 lb 9.6 oz)   SpO2 96%   BMI 32.20 kg/m  General: NAD Neck: JVP   8 cm with HJR, no thyromegaly or thyroid nodule.  Lungs: Clear to auscultation bilaterally with normal respiratory effort. CV: Nondisplaced PMI.  Heart tachy, regular S1/S2, no S3/S4, no murmur.  1+ edema 1/2 to knees bilaterally.  No carotid bruit.  Normal pedal pulses.  Abdomen: Soft, nontender, no hepatosplenomegaly, no distention.  Skin: Intact without lesions or rashes.  Neurologic: Alert and oriented x 3.  Psych: Normal affect. Extremities: No clubbing or cyanosis.  HEENT: Normal.   Assessment/Plan: 1. Chronic systolic CHF:  Echoes in 7/20 and 9/21, presumably in NSR, showed EF 55-60%.  Echo in 10/22 showed EF down to 45-50%, he was in atrial fibrillation at that time.  Echo in 10/23, also in AF or AFL, showed EF 25%, global hypokinesis, moderate RV dysfunction, s/p MV repair with no MR and no significant stenosis. Patient had a cath with no significant coronary disease in 5/20.  His mitral valve repair appeared intact on last echo.  I suspect that the fall in EF may be due to persistent AF/AFL with RVR (tachycardia-mediated cardiomyopathy); it appears his HR has been > 100 chronically.  He is not particularly symptomatic, NYHA class II, but he does appear to have mild volume overload on exam.  - We need to get him back into NSR, see below.  - Stop losartan, start Entresto 24/26 bid.  This should allow some additional diuresis.  BMET/BNP today and again in 10 days.  - Stop metoprolol tartrate and start Toprol XL 25 mg daily.  - Continue torsemide 20 mg daily  and KCl 10 bid.  2. Atrial fibrillation/atypical AFL: H/o Maze and LA appendage ligation with MV repair in 2020.  Patient has had recurrent atrial arrhythmias.  Patient has been noted to have both AF and atypical AFL, he is in atypical AFL today. HR has been chronically > 100, concern for tachycardia-mediated cardiomyopathy.  We need to get him back into NSR and keep him in NSR.  - Start amiodarone 200 mg bid x 2 wks then 200 mg daily.  - Continue warfarin.  - I will arrange for TEE-guided DCCV after he has been on amiodarone for 10-14 days.  His INR has not been therapeutic recently, will need to check today.  We discussed risks/benefits of procedure and he agrees to it.  - Will need to eventually refer to EP for ablation consideration for long-term management.  3. H/o MV repair: Mitral valve repair appeared stable on 10/23 echo with no significant MR or MS.  - Needs antibiotic prophylaxis with dental work.   Followup in 1 month.   Garrin Kirwan 11/22/2022   

## 2022-11-23 ENCOUNTER — Ambulatory Visit (INDEPENDENT_AMBULATORY_CARE_PROVIDER_SITE_OTHER): Payer: Medicare HMO | Admitting: Internal Medicine

## 2022-11-23 DIAGNOSIS — Z5181 Encounter for therapeutic drug level monitoring: Secondary | ICD-10-CM | POA: Diagnosis not present

## 2022-11-23 DIAGNOSIS — Z7901 Long term (current) use of anticoagulants: Secondary | ICD-10-CM

## 2022-11-23 DIAGNOSIS — I4891 Unspecified atrial fibrillation: Secondary | ICD-10-CM

## 2022-11-23 NOTE — Patient Instructions (Addendum)
Description   Called and spoke to pt and instructed him to continue to take warfarin 1 tablet daily except 1.5 tablets on Wednesdays and Fridays.  Recheck INR in 1 week. Pt Started Amio (1/19), TEE/DCCV on 2/9 [facility] [phone].   Per Dr. Aundra Dubin on 1/22: he is getting a TEE. INR last week was > 2. Needs point-of-care INR morning of procedure to make sure > 2. Would be reasonable do one this week to make sure we don't need to increase his coumadin and have best chance of INR > 2 day of procedure.

## 2022-11-24 ENCOUNTER — Ambulatory Visit: Payer: Medicare HMO

## 2022-11-30 ENCOUNTER — Ambulatory Visit: Payer: Medicare HMO | Attending: Cardiovascular Disease

## 2022-11-30 DIAGNOSIS — Z5181 Encounter for therapeutic drug level monitoring: Secondary | ICD-10-CM | POA: Diagnosis not present

## 2022-11-30 DIAGNOSIS — Z7901 Long term (current) use of anticoagulants: Secondary | ICD-10-CM | POA: Diagnosis not present

## 2022-11-30 DIAGNOSIS — I4892 Unspecified atrial flutter: Secondary | ICD-10-CM

## 2022-11-30 DIAGNOSIS — I4819 Other persistent atrial fibrillation: Secondary | ICD-10-CM | POA: Diagnosis not present

## 2022-11-30 LAB — POCT INR: INR: 3.1 — AB (ref 2.0–3.0)

## 2022-11-30 NOTE — Patient Instructions (Signed)
continue to take warfarin 1 tablet daily except 1.5 tablets on Wednesdays and Fridays.  Recheck INR in 1 week. Pt Started Amio (1/19), TEE/DCCV on 2/9 [facility] [phone].   Per Dr. Aundra Dubin on 1/22: he is getting a TEE. INR last week was > 2. Needs point-of-care INR morning of procedure to make sure > 2. Would be reasonable do one this week to make sure we don't need to increase his coumadin and have best chance of INR > 2 day of procedure.

## 2022-12-03 ENCOUNTER — Ambulatory Visit (INDEPENDENT_AMBULATORY_CARE_PROVIDER_SITE_OTHER): Payer: Medicare HMO | Admitting: Orthopedic Surgery

## 2022-12-03 DIAGNOSIS — S81802D Unspecified open wound, left lower leg, subsequent encounter: Secondary | ICD-10-CM

## 2022-12-04 ENCOUNTER — Encounter: Payer: Self-pay | Admitting: Orthopedic Surgery

## 2022-12-04 NOTE — Progress Notes (Signed)
Office Visit Note   Patient: [name]           Date of Birth: 03/13/1956           MRN: [id number] Visit Date: 12/03/2022              Requested by: [name], MD [address] [place],  [place] [id number] PCP: [name], MD  Chief Complaint  Patient presents with   Left Leg - Follow-up      HPI: Patient is a 67 year old gentleman who presents in follow-up for wound of the left lower extremity.  Assessment & Plan: Visit Diagnoses:  1. Leg wound, left, subsequent encounter     Plan: Wound was debrided donated Kerecis tissue graft applied compression wrap applied.  Patient will begin dressing changes in 3 days.  Follow-Up Instructions: Return in about 4 weeks (around 12/31/2022).   Ortho Exam  Patient is alert, oriented, no adenopathy, well-dressed, normal affect, normal respiratory effort. Examination the wound bed has fibrinous exudative tissue.  There is no ascending cellulitis.  No odor no drainage.  After informed consent a debris soft was used to debride back to healthy viable bleeding granulation tissue.  The wounds largest dimensions were 5 x 5 cm.  Kerecis micro graft was applied to Adaptic 4 x 4's and a compression wrap.  Imaging: No results found.   Labs: Lab Results  Component Value Date   HGBA1C 5.4 01/29/2022   HGBA1C 5.2 03/16/2019   REPTSTATUS 01/07/2022 FINAL 01/02/2022   REPTSTATUS 01/08/2022 FINAL 01/02/2022   GRAMSTAIN  01/02/2022    FEW WBC PRESENT,BOTH PMN AND MONONUCLEAR RARE GRAM POSITIVE COCCI RARE GRAM NEGATIVE RODS Performed at [facility], [address], [place], [place] [address]    GRAMSTAIN  01/02/2022    FEW WBC PRESENT,BOTH PMN AND MONONUCLEAR NO ORGANISMS SEEN    CULT  01/02/2022    FEW STREPTOCOCCUS GROUP C Beta hemolytic streptococci are predictably susceptible to penicillin and other beta lactams. Susceptibility testing not routinely performed. ABUNDANT KLEBSIELLA OXYTOCA ABUNDANT  STENOTROPHOMONAS MALTOPHILIA    CULT  01/02/2022    NO ANAEROBES ISOLATED Performed at [facility], [place] [address] [place],  [address]    [place] OXYTOCA 01/02/2022   LABORGA STENOTROPHOMONAS MALTOPHILIA 01/02/2022     Lab Results  Component Value Date   ALBUMIN 4.0 11/20/2022   ALBUMIN 2.5 (L) 01/30/2022   ALBUMIN 3.1 (L) 01/29/2022    Lab Results  Component Value Date   MG 2.1 01/29/2022   MG 2.1 06/19/2019   MG 2.2 03/25/2019   No results found for: "VD25OH"  No results found for: "PREALBUMIN"    Latest Ref Rng & Units 11/20/2022   12:12 PM 04/10/2022    8:06 AM 03/18/2022   11:08 AM  CBC EXTENDED  WBC 4.0 - 10.5 K/uL 6.7  6.0  6.5   RBC 4.22 - 5.81 MIL/uL 4.80  4.02  3.80   Hemoglobin 13.0 - 17.0 g/dL 15.1  9.7  9.1 Repeated and verified X2.   HCT 39.0 - 52.0 % 46.7  34.4  30.0   Platelets 150 - 400 K/uL 187  242  315.0   NEUT# 1.4 - 7.7 K/uL   4.8   Lymph# 0.7 - 4.0 K/uL   0.9      There is no height or weight on file to calculate BMI.  Orders:  No orders of the defined types were placed in this  encounter.  No orders of the defined types were placed in this encounter.    Procedures: No procedures performed  Clinical Data: No additional findings.  ROS:  All other systems negative, except as noted in the HPI. Review of Systems  Objective: Vital Signs: There were no vitals taken for this visit.  Specialty Comments:  No specialty comments available.  PMFS History: Patient Active Problem List   Diagnosis Date Noted   Left ventricular dysfunction 10/13/2022   Rectal bleeding 01/30/2022   Abnormal finding on imaging 01/30/2022   GIB (gastrointestinal bleeding) 01/29/2022   Hyponatremia 01/29/2022   Hypocalcemia 01/29/2022   Alcohol abuse 01/29/2022   AKI (acute kidney injury) ([place]) 01/29/2022   Symptomatic anemia 01/29/2022   Venous stasis dermatitis of both lower extremities    Bleeding on Coumadin    Leg wound,  left, subsequent encounter 12/30/2021   Compartment syndrome of left lower extremity ([place]) 09/22/2021   Status post surgery 09/22/2021   Compartment syndrome of left upper extremity ([place]) 09/22/2021   Hyperlipidemia 12/13/2020   Atrial fibrillation ([place]) 06/10/2020   Long term (current) use of anticoagulants 06/10/2020   Secondary hypercoagulable state ([employer]) 04/24/2020   AV junctional bradycardia 03/22/2019   S/P minimally invasive mitral valve repair  03/21/2019   S/P Maze operation for atrial fibrillation 03/21/2019   Dilated aortic root ([place]) 01/04/2019   Dilated cardiomyopathy ([place]) 01/04/2019   Atrial flutter with rapid ventricular response ([place])    Mitral valve prolapse    Hypertensive urgency 12/30/2018   Acute on chronic systolic (congestive) heart failure ([place]) 54/07/8118   Acute systolic heart failure ([place]) 14/78/2956   Chronic systolic (congestive) heart failure (HCC)    Persistent atrial fibrillation ([place]) 12/02/2018   Right hamstring muscle strain 07/20/2018   Essential hypertension 02/07/2016   Severe mitral regurgitation    Annual physical exam 11/16/2014   Arthritis of left knee 10/23/2014   Status post total left knee replacement 10/23/2014   Arthritis of knee, right 01/19/2014   Status post total knee replacement 01/19/2014   Benign paroxysmal positional vertigo 10/30/2013   Testicular hypofunction 03/24/2012   Past Medical History:  Diagnosis Date   Acute on chronic systolic (congestive) heart failure ([place]) 12/30/2018   Allergy    Arthritis    Atrial flutter with rapid ventricular response (HCC)    Chronic systolic (congestive) heart failure ([place])    COVID 10/2021   mild   Dilated aortic root ([place]) 01/04/2019   Dilated cardiomyopathy ([place]) 01/04/2019   Dysrhythmia    Heart murmur    History of blood transfusion    01/27/22   History of colon polyps    Hypertension    Insomnia    Mitral valve prolapse    Mitral valve regurgitation    Persistent  atrial fibrillation ([place]) 12/02/2018   S/P Maze operation for atrial fibrillation 03/21/2019   Complete bilateral atrial lesion set using cryothermy and bipolar radiofrequency ablation with clipping of LA appendage via right mini thoracotomy approach   S/P minimally invasive mitral valve repair 03/21/2019   Complex valvuloplasty including triangular resection of posterior leaflet, artificial Gore-tex neochord placement x4 and 36 mm Sorin Memo 4D ring annuloplasty via right mini thoracotomy approach   Seizures ([place])    had one approx. 30 yrs ago,has not had any since; due to alchol and no sleep   Severe mitral regurgitation     Family History  Problem Relation Age of Onset   Hypertension Father    Colon cancer  Father        dx in his late 38's   Diabetes Mother    Stomach cancer Neg Hx    Esophageal cancer Neg Hx    Pancreatic cancer Neg Hx    Rectal cancer Neg Hx     Past Surgical History:  Procedure Laterality Date   ANKLE FUSION  left   Dec. 2409   APPLICATION OF WOUND VAC Left 09/22/2021   Procedure: APPLICATION OF WOUND VAC;  Surgeon: Mcarthur Rossetti, MD;  Location: [facility];  Service: Orthopedics;  Laterality: Left;   APPLICATION OF WOUND VAC Left 12/30/2021   Procedure: APPLICATION OF WOUND VAC;  Surgeon: Mcarthur Rossetti, MD;  Location: [facility];  Service: Orthopedics;  Laterality: Left;   APPLICATION OF WOUND VAC Left 04/10/2022   Procedure: APPLICATION OF WOUND VAC;  Surgeon: Newt Minion, MD;  Location: [facility];  Service: Orthopedics;  Laterality: Left;   ARTHROSCOPY KNEE W/ DRILLING  bilateral   2012   BIOPSY  01/30/2022   Procedure: BIOPSY;  Surgeon: Rush Landmark Telford Nab., MD;  Location[facility];  Service: Gastroenterology;;   CARDIAC VALVE REPLACEMENT     CARDIOVERSION N/A 01/03/2019   Procedure: CARDIOVERSION;  Surgeon: Sueanne Margarita, MD;  Location: [facility]COPY;  Service: Cardiovascular;  Laterality: N/A;   CARDIOVERSION N/A 06/23/2019   Procedure:  CARDIOVERSION;  Surgeon: Fay Records, MD;  Location: [facility];  Service: Cardiovascular;  Laterality: N/A;   CLIPPING OF ATRIAL APPENDAGE  03/21/2019   Procedure: Clipping Of Atrial Appendage using 56m Atricure Pro2 Clip;  Surgeon: ORexene Alberts MD;  [place] [facility]  Service: Open Heart Surgery;;   COLONOSCOPY     x2   COLONOSCOPY N/A 01/30/2022   Procedure: COLONOSCOPY;  Surgeon: MIrving Copas, MD;  [facility];  Service: Gastroenterology;  Laterality: N/A;   ESOPHAGOGASTRODUODENOSCOPY N/A 01/30/2022   Procedure: ESOPHAGOGASTRODUODENOSCOPY (EGD);  Surgeon: MIrving Copas, MD;  [facility];  Service: Gastroenterology;  Laterality: N/A;   FOOT ARTHRODESIS  06/23/2012   Procedure: ARTHRODESIS FOOT;  Surgeon: JWylene Simmer MD;  [place] [facility]  Service: Orthopedics;  Laterality: Left;  Left Subtalar and Talonavicular Joint Revision Arthrodesis  Aspiration of Bone Marrow from Left Hip    HAIR TRANSPLANT     HARDWARE REMOVAL  06/23/2012   Procedure: HARDWARE REMOVAL;  Surgeon: JWylene Simmer MD;  [place]  Service: Orthopedics;  Laterality: Left;  Removal of Deep Implant  X's 3   I & D EXTREMITY Left 09/23/2021   Procedure: IRRIGATION AND DEBRIDEMENT OF LEG;  Surgeon: BMcarthur Rossetti MD;  [place]  Service: Orthopedics;  Laterality: Left;   I & D EXTREMITY Left 09/26/2021   Procedure: REPEAT IRRIGATION AND DEBRIDEMENT LEFT LEG, POSSIBLE WOUND CLOSURE, POSSIBLE VAC CHANGE, POSSIBLE SKIN GRAFT;  Surgeon: BMcarthur Rossetti MD;  [place]  Service: Orthopedics;  Laterality: Left;   I & D EXTREMITY Left 12/30/2021   Procedure: IRRIGATION AND DEBRIDEMENT LEFT LOWER LEG WOUND;  Surgeon: BMcarthur Rossetti MD;  [place] [facility]  Service: Orthopedics;  Laterality: Left;   I & D EXTREMITY Left 01/02/2022   Procedure: LEFT LEG DEBRIDEMENT AND TISSUE GRAFT;  Surgeon: DNewt Minion MD;  [place] [facility]  Service:  Orthopedics;  Laterality: Left;   INCISION AND DRAINAGE WOUND WITH FASCIOTOMY Left 09/22/2021   Procedure: INCISION AND DRAINAGE WOUND WITH FASCIOTOMY;  Surgeon: BMcarthur Rossetti MD;  [place] [facility]  Service:  Orthopedics;  Laterality: Left;   INSERTION OF MESH N/A 07/10/2016   Procedure: INSERTION OF MESH;  Surgeon: Coralie Keens, MD;  Location: [facility];  Service: General;  Laterality: N/A;   JOINT REPLACEMENT     right hip  01-2011   LEFT HEART CATH AND CORONARY ANGIOGRAPHY N/A 03/16/2019   Procedure: LEFT HEART CATH AND CORONARY ANGIOGRAPHY;  Surgeon: Sherren Mocha, MD;  Location: [facility];  Service: Cardiovascular;  Laterality: N/A;   LIMB SPARING RESECTION HIP W/ SADDLE JOINT REPLACEMENT Right    MINIMALLY INVASIVE MAZE PROCEDURE N/A 03/21/2019   Procedure: MINIMALLY INVASIVE MAZE PROCEDURE;  Surgeon: Rexene Alberts, MD;  Location: [facility];  Service: Open Heart Surgery;  Laterality: N/A;   MITRAL VALVE REPAIR Right 03/21/2019   Procedure: MINIMALLY INVASIVE MITRAL VALVE REPAIR (MVR) using Memo 4D ring size 36;  Surgeon: Rexene Alberts, MD;  Location: [facility];  Service: Open Heart Surgery;  Laterality: Right;   POLYPECTOMY  01/30/2022   Procedure: POLYPECTOMY;  Surgeon: Mansouraty, Telford Nab., MD;  Location[facility];  Service: Gastroenterology;;   SKIN SPLIT GRAFT Left 04/10/2022   Procedure: APPLY SKIN GRAFT TO LEFT LEG WOUND;  Surgeon: Newt Minion, MD;  Location: [facility];  Service: Orthopedics;  Laterality: Left;   TEE WITHOUT CARDIOVERSION N/A 01/03/2019   Procedure: TRANSESOPHAGEAL ECHOCARDIOGRAM (TEE);  Surgeon: Sueanne Margarita, MD;  Location: [facility];  Service: Cardiovascular;  Laterality: N/A;   TEE WITHOUT CARDIOVERSION N/A 03/21/2019   Procedure: TRANSESOPHAGEAL ECHOCARDIOGRAM (TEE);  Surgeon: Rexene Alberts, MD;  Location: [facility];  Service: Open Heart Surgery;  Laterality: N/A;   TEMPORARY PACEMAKER N/A 03/22/2019   Procedure: TEMPORARY  PACEMAKER;  Surgeon: Belva Crome, MD;  Location: [facility];  Service: Cardiovascular;  Laterality: N/A;   TOTAL KNEE ARTHROPLASTY Right 01/19/2014   Procedure: RIGHT TOTAL KNEE ARTHROPLASTY, Steroid injection left knee;  Surgeon: Mcarthur Rossetti, MD;  [facility];  Service: Orthopedics;  Laterality: Right;   TOTAL KNEE ARTHROPLASTY Left 10/23/2014   Procedure: LEFT TOTAL KNEE ARTHROPLASTY;  Surgeon: Mcarthur Rossetti, MD;  [facility];  Service: Orthopedics;  Laterality: Left;   UMBILICAL HERNIA REPAIR N/A 07/10/2016   Procedure: UMBILICAL HERNIA REPAIR;  Surgeon: Coralie Keens, MD;  Location: [facility];  Service: General;  Laterality: N/A;   Social History   Occupational History   Occupation: Pension scheme manager  Tobacco Use   Smoking status: Never   Smokeless tobacco: Never  Vaping Use   Vaping Use: Never used  Substance and Sexual Activity   Alcohol use: Not Currently    Alcohol/week: 21.0 standard drinks of alcohol    Types: 21 Shots of liquor per week   Drug use: No   Sexual activity: Yes

## 2022-12-10 ENCOUNTER — Telehealth (HOSPITAL_COMMUNITY): Payer: Self-pay

## 2022-12-10 NOTE — Telephone Encounter (Signed)
Spoke to patient to remind him of procedure scheduled to tomorrow.Patient aware of time,holding torsemide am of procedure.Nothing to eat or drink after midnight. Has transportation to and from procedure.

## 2022-12-11 ENCOUNTER — Encounter (HOSPITAL_COMMUNITY): Payer: Self-pay | Admitting: Cardiology

## 2022-12-11 ENCOUNTER — Ambulatory Visit (INDEPENDENT_AMBULATORY_CARE_PROVIDER_SITE_OTHER): Payer: Medicare HMO

## 2022-12-11 ENCOUNTER — Encounter (HOSPITAL_COMMUNITY)
Admission: RE | Disposition: A | Discharge status: Home / Self Care | Payer: Self-pay | Source: Ambulatory Visit | Attending: Cardiology

## 2022-12-11 ENCOUNTER — Ambulatory Visit (HOSPITAL_BASED_OUTPATIENT_CLINIC_OR_DEPARTMENT_OTHER): Payer: Medicare HMO | Admitting: Anesthesiology

## 2022-12-11 ENCOUNTER — Ambulatory Visit (HOSPITAL_BASED_OUTPATIENT_CLINIC_OR_DEPARTMENT_OTHER): Payer: Medicare HMO

## 2022-12-11 ENCOUNTER — Ambulatory Visit (HOSPITAL_COMMUNITY): Payer: Medicare HMO | Admitting: Anesthesiology

## 2022-12-11 ENCOUNTER — Other Ambulatory Visit: Payer: Self-pay

## 2022-12-11 ENCOUNTER — Ambulatory Visit (HOSPITAL_COMMUNITY)
Admission: RE | Admit: 2022-12-11 | Discharge: 2022-12-11 | Disposition: A | Discharge status: Home / Self Care | Payer: Medicare HMO | Source: Ambulatory Visit | Attending: Cardiology | Admitting: Cardiology

## 2022-12-11 DIAGNOSIS — I484 Atypical atrial flutter: Secondary | ICD-10-CM

## 2022-12-11 DIAGNOSIS — I110 Hypertensive heart disease with heart failure: Secondary | ICD-10-CM | POA: Insufficient documentation

## 2022-12-11 DIAGNOSIS — I4819 Other persistent atrial fibrillation: Secondary | ICD-10-CM | POA: Insufficient documentation

## 2022-12-11 DIAGNOSIS — I4891 Unspecified atrial fibrillation: Secondary | ICD-10-CM

## 2022-12-11 DIAGNOSIS — I340 Nonrheumatic mitral (valve) insufficiency: Secondary | ICD-10-CM

## 2022-12-11 DIAGNOSIS — I509 Heart failure, unspecified: Secondary | ICD-10-CM

## 2022-12-11 DIAGNOSIS — Z5181 Encounter for therapeutic drug level monitoring: Secondary | ICD-10-CM

## 2022-12-11 DIAGNOSIS — D649 Anemia, unspecified: Secondary | ICD-10-CM | POA: Diagnosis not present

## 2022-12-11 DIAGNOSIS — I5022 Chronic systolic (congestive) heart failure: Secondary | ICD-10-CM | POA: Diagnosis not present

## 2022-12-11 DIAGNOSIS — Z7901 Long term (current) use of anticoagulants: Secondary | ICD-10-CM | POA: Diagnosis not present

## 2022-12-11 DIAGNOSIS — I4892 Unspecified atrial flutter: Secondary | ICD-10-CM | POA: Diagnosis not present

## 2022-12-11 DIAGNOSIS — Z79899 Other long term (current) drug therapy: Secondary | ICD-10-CM | POA: Insufficient documentation

## 2022-12-11 HISTORY — PX: CARDIOVERSION: SHX1299

## 2022-12-11 HISTORY — PX: TEE WITHOUT CARDIOVERSION: SHX5443

## 2022-12-11 LAB — POCT I-STAT, CHEM 8
BUN: 27 mg/dL — ABNORMAL HIGH (ref 8–23)
Calcium, Ion: 1.22 mmol/L (ref 1.15–1.40)
Chloride: 103 mmol/L (ref 98–111)
Creatinine, Ser: 1.1 mg/dL (ref 0.61–1.24)
Glucose, Bld: 95 mg/dL (ref 70–99)
HCT: 50 % (ref 39.0–52.0)
Hemoglobin: 17 g/dL (ref 13.0–17.0)
Potassium: 4.5 mmol/L (ref 3.5–5.1)
Sodium: 142 mmol/L (ref 135–145)
TCO2: 30 mmol/L (ref 22–32)

## 2022-12-11 LAB — POCT INR: INR: 3.2 — AB (ref 2.0–3.0)

## 2022-12-11 LAB — ECHO TEE

## 2022-12-11 SURGERY — ECHOCARDIOGRAM, TRANSESOPHAGEAL
Anesthesia: Monitor Anesthesia Care

## 2022-12-11 MED ORDER — PROPOFOL 500 MG/50ML IV EMUL
INTRAVENOUS | Status: DC | PRN
  Administered 2022-12-11: 125 ug/kg/min via INTRAVENOUS

## 2022-12-11 MED ORDER — PROPOFOL 10 MG/ML IV BOLUS
INTRAVENOUS | Status: DC | PRN
  Administered 2022-12-11 (×3): 20 mg via INTRAVENOUS

## 2022-12-11 MED ORDER — SODIUM CHLORIDE 0.9 % IV SOLN
INTRAVENOUS | Status: AC | PRN
  Administered 2022-12-11: 500 mL via INTRAVENOUS

## 2022-12-11 MED ORDER — SODIUM CHLORIDE 0.9 % IV SOLN: INTRAVENOUS | Status: DC

## 2022-12-11 MED ORDER — PHENYLEPHRINE 80 MCG/ML (10ML) SYRINGE FOR IV PUSH (FOR BLOOD PRESSURE SUPPORT)
PREFILLED_SYRINGE | INTRAVENOUS | Status: DC | PRN
  Administered 2022-12-11: 80 ug via INTRAVENOUS

## 2022-12-11 NOTE — Procedures (Signed)
Electrical Cardioversion Procedure Note [name] IS:[id number] 1956-06-20  Procedure: Electrical Cardioversion Indications:  Atrial Flutter  Procedure Details Consent: Risks of procedure as well as the alternatives and risks of each were explained to the (patient/caregiver).  Consent for procedure obtained. Time Out: Verified patient identification, verified procedure, site/side was marked, verified correct patient position, special equipment/implants available, medications/allergies/relevent history reviewed, required imaging and test results available.  Performed  Patient placed on cardiac monitor, pulse oximetry, supplemental oxygen as necessary.  Sedation given:  propofol per anesthesiology Pacer pads placed anterior and posterior chest.  Cardioverted 1 time(s).  Cardioverted at [place].  Evaluation Findings: Post procedure EKG shows: NSR Complications: None Patient did tolerate procedure well.   [name] 12/11/2022, 12:42 PM

## 2022-12-11 NOTE — CV Procedure (Signed)
Procedure: TEE  Indication: Atrial flutter  Sedation: Per anesthesiology  Findings: Please see echo section for full report.  No thrombus in left atrium or the residual LA appendage (was clipped at time of surgery).    Proceeding with DCCV.   [name] 12/11/2022 12:42 PM

## 2022-12-11 NOTE — Anesthesia Preprocedure Evaluation (Signed)
Anesthesia Evaluation  Patient identified by MRN, date of birth, ID band Patient awake    Reviewed: Allergy & Precautions, H&P , NPO status , Patient's Chart, lab work & pertinent test results, reviewed documented beta blocker date and time   History of Anesthesia Complications Negative for: history of anesthetic complications  Airway Mallampati: II  TM Distance: >3 FB Neck ROM: Full    Dental  (+) Dental Advisory Given, Chipped,    Pulmonary neg pulmonary ROS   Pulmonary exam normal        Cardiovascular hypertension, Pt. on medications and Pt. on home beta blockers +CHF  Normal cardiovascular exam+ dysrhythmias Atrial Fibrillation + Valvular Problems/Murmurs (s/p MVR) MR    Echo 08/2021: EF 45-50%, no RWMA, mild LVH, mildly reduced RVSF, mod LAE/RAE, no AR/AS   Neuro/Psych Seizures -, Well Controlled,   Anxiety        GI/Hepatic Neg liver ROS,GERD  Medicated and Controlled,,  Endo/Other  negative endocrine ROS    Renal/GU negative Renal ROS  negative genitourinary   Musculoskeletal  (+) Arthritis , Osteoarthritis,  Chronic left leg wound   Abdominal   Peds negative pediatric ROS (+)  Hematology  (+) Blood dyscrasia, anemia Coumadin therapy- last dose 2/23   Anesthesia Other Findings   Reproductive/Obstetrics negative OB ROS                             Anesthesia Physical Anesthesia Plan  ASA: 3  Anesthesia Plan: MAC   Post-op Pain Management: Minimal or no pain anticipated   Induction: Intravenous  PONV Risk Score and Plan: 2 and Ondansetron, Midazolam and Treatment may vary due to age or medical condition  Airway Management Planned: Nasal Cannula  Additional Equipment: None  Intra-op Plan:   Post-operative Plan:   Informed Consent: I have reviewed the patients History and Physical, chart, labs and discussed the procedure including the risks, benefits and alternatives for  the proposed anesthesia with the patient or authorized representative who has indicated his/her understanding and acceptance.     Dental advisory given  Plan Discussed with:   Anesthesia Plan Comments:         Anesthesia Quick Evaluation

## 2022-12-11 NOTE — Anesthesia Procedure Notes (Signed)
Procedure Name: MAC Date/Time: 12/11/2022 12:24 PM  Performed by: Amadeo Garnet, CRNAPre-anesthesia Checklist: Emergency Drugs available, Suction available, Patient being monitored and Patient identified Patient Re-evaluated:Patient Re-evaluated prior to induction Oxygen Delivery Method: Nasal cannula Preoxygenation: Pre-oxygenation with 100% oxygen Induction Type: IV induction Placement Confirmation: positive ETCO2 Dental Injury: Teeth and Oropharynx as per pre-operative assessment

## 2022-12-11 NOTE — Transfer of Care (Signed)
Immediate Anesthesia Transfer of Care Note  Patient: [name]  Procedure(s) Performed: TRANSESOPHAGEAL ECHOCARDIOGRAM (TEE) CARDIOVERSION  Patient Location: Endoscopy Unit  Anesthesia Type:MAC  Level of Consciousness: drowsy and patient cooperative  Airway & Oxygen Therapy: Patient Spontanous Breathing and Patient connected to nasal cannula oxygen  Post-op Assessment: Report given to RN, Post -op Vital signs reviewed and stable, and Patient moving all extremities  Post vital signs: Reviewed and stable  Last Vitals:  Vitals Value Taken Time  BP    Temp    Pulse    Resp    SpO2      Last Pain:  Vitals:   12/11/22 1158  TempSrc: Temporal  PainSc: 0-No pain         Complications: No notable events documented.

## 2022-12-11 NOTE — Discharge Instructions (Signed)

## 2022-12-11 NOTE — Interval H&P Note (Signed)
History and Physical Interval Note:  12/11/2022 12:05 PM  [name]  has presented today for surgery, with the diagnosis of AFIB.  The various methods of treatment have been discussed with the patient and family. After consideration of risks, benefits and other options for treatment, the patient has consented to  Procedure(s): TRANSESOPHAGEAL ECHOCARDIOGRAM (TEE) (N/A) CARDIOVERSION (N/A) as a surgical intervention.  The patient's history has been reviewed, patient examined, no change in status, stable for surgery.  I have reviewed the patient's chart and labs.  Questions were answered to the patient's satisfaction.     Fujie Dickison [employer]

## 2022-12-11 NOTE — Progress Notes (Signed)
  Echocardiogram Echocardiogram Transesophageal has been performed.  [name] 12/11/2022, 12:56 PM

## 2022-12-11 NOTE — Patient Instructions (Signed)
Description   Continue to take warfarin 1 tablet daily except 1.5 tablets on Wednesdays and Fridays.  Recheck INR in 1 week post procedure.   Pt Started [facility] (1/19) [facility] [phone].

## 2022-12-14 ENCOUNTER — Encounter (HOSPITAL_COMMUNITY): Payer: Self-pay | Admitting: Cardiology

## 2022-12-14 NOTE — Anesthesia Postprocedure Evaluation (Signed)
Anesthesia Post Note  Patient: DELOIS EPP  Procedure(s) Performed: TRANSESOPHAGEAL ECHOCARDIOGRAM (TEE) CARDIOVERSION     Patient location during evaluation: PACU Anesthesia Type: MAC Level of consciousness: awake and alert Pain management: pain level controlled Vital Signs Assessment: post-procedure vital signs reviewed and stable Respiratory status: spontaneous breathing, nonlabored ventilation and respiratory function stable Cardiovascular status: blood pressure returned to baseline and stable Postop Assessment: no apparent nausea or vomiting Anesthetic complications: no   No notable events documented.  Last Vitals:  Vitals:   12/11/22 1257 12/11/22 1305  BP: 126/88 130/87  Pulse: (!) 58 (!) 58  Resp: 14 12  Temp:    SpO2: 96% 97%    Last Pain:  Vitals:   12/14/22 1242  TempSrc:   PainSc: 0-No pain   Pain Goal:                   Lynda Rainwater

## 2022-12-18 ENCOUNTER — Ambulatory Visit: Payer: Medicare HMO | Attending: Cardiovascular Disease

## 2022-12-21 ENCOUNTER — Telehealth: Payer: Self-pay | Admitting: Orthopedic Surgery

## 2022-12-21 NOTE — Telephone Encounter (Signed)
Pt called stating he need a call from [place] states he received a call from [facility] stating they denied not going to pay for an additional skin graft that Dr Sharol Given asked for. Pt need someone to call back him back about this matter. Pt wasn't really clear and stated he really didn't understand the call. Pt phone number is [phone].

## 2022-12-21 NOTE — Telephone Encounter (Signed)
I called the number that is listed on this message and the pt's wife states that he is at work and that this was her cell number to call [phone] to speak with the pt.

## 2022-12-22 ENCOUNTER — Ambulatory Visit: Payer: Medicare HMO | Admitting: Orthopedic Surgery

## 2022-12-22 ENCOUNTER — Telehealth: Payer: Self-pay | Admitting: Orthopedic Surgery

## 2022-12-22 NOTE — Telephone Encounter (Signed)
Duplicate see previous message. This has been addressed.

## 2022-12-22 NOTE — Telephone Encounter (Signed)
Pt has an appt in office this afternoon

## 2022-12-22 NOTE — Telephone Encounter (Signed)
Pt returned call to Autumn F. Pt is asking for a return call to [phone]. Pt states he will be in office all morning.

## 2022-12-22 NOTE — Telephone Encounter (Signed)
I called and advised pt that we are in the process of an appeal foe the in office graft. He states that that the wound is getting better everyday and he does not want to come in the office unless he absolutely has to. I advised that we could cancel the appt for today and I will keep him posted on the approval process of the Kerecis graft. We made an appt for him to come in the office in 2 weeks if the graft is approved or not to make sure that the wound is continuing in the right direction. Pt was agreeable to this and will call with any questions. Will continue to work on appeal for the graft for possible in office application.

## 2022-12-23 ENCOUNTER — Other Ambulatory Visit: Payer: Self-pay

## 2022-12-23 ENCOUNTER — Encounter: Payer: Self-pay | Admitting: Orthopedic Surgery

## 2022-12-23 NOTE — Telephone Encounter (Signed)
LOMN was uploaded to [facility] page. Will monitor for approval.

## 2022-12-31 ENCOUNTER — Encounter (HOSPITAL_COMMUNITY): Payer: Medicare HMO | Admitting: Cardiology

## 2023-01-01 ENCOUNTER — Encounter (HOSPITAL_COMMUNITY): Payer: Self-pay | Admitting: Cardiology

## 2023-01-01 ENCOUNTER — Ambulatory Visit (HOSPITAL_COMMUNITY)
Admission: RE | Admit: 2023-01-01 | Discharge: 2023-01-01 | Disposition: A | Discharge status: Home / Self Care | Payer: Medicare HMO | Source: Ambulatory Visit | Attending: Cardiology | Admitting: Cardiology

## 2023-01-01 VITALS — BP 146/86 | HR 75 | Wt 259.4 lb

## 2023-01-01 DIAGNOSIS — Z7901 Long term (current) use of anticoagulants: Secondary | ICD-10-CM | POA: Insufficient documentation

## 2023-01-01 DIAGNOSIS — I110 Hypertensive heart disease with heart failure: Secondary | ICD-10-CM | POA: Insufficient documentation

## 2023-01-01 DIAGNOSIS — I4819 Other persistent atrial fibrillation: Secondary | ICD-10-CM | POA: Insufficient documentation

## 2023-01-01 DIAGNOSIS — I5022 Chronic systolic (congestive) heart failure: Secondary | ICD-10-CM | POA: Diagnosis not present

## 2023-01-01 DIAGNOSIS — Z79899 Other long term (current) drug therapy: Secondary | ICD-10-CM | POA: Diagnosis not present

## 2023-01-01 LAB — COMPREHENSIVE METABOLIC PANEL
ALT: 33 U/L (ref 0–44)
AST: 23 U/L (ref 15–41)
Albumin: 4 g/dL (ref 3.5–5.0)
Alkaline Phosphatase: 55 U/L (ref 38–126)
Anion gap: 11 (ref 5–15)
BUN: 28 mg/dL — ABNORMAL HIGH (ref 8–23)
CO2: 26 mmol/L (ref 22–32)
Calcium: 8.9 mg/dL (ref 8.9–10.3)
Chloride: 99 mmol/L (ref 98–111)
Creatinine, Ser: 1.26 mg/dL — ABNORMAL HIGH (ref 0.61–1.24)
GFR, Estimated: >60 mL/min (ref >60)
Glucose, Bld: 98 mg/dL (ref 70–99)
Potassium: 4 mmol/L (ref 3.5–5.1)
Sodium: 136 mmol/L (ref 135–145)
Total Bilirubin: 0.9 mg/dL (ref 0.3–1.2)
Total Protein: 6.7 g/dL (ref 6.5–8.1)

## 2023-01-01 LAB — TSH: TSH: 2.335 u[IU]/mL (ref 0.350–4.500)

## 2023-01-01 MED ORDER — SPIRONOLACTONE 25 MG PO TABS: 12.5000 mg | ORAL_TABLET | Freq: Every day | ORAL | 3 refills | Status: DC

## 2023-01-01 NOTE — Patient Instructions (Addendum)
START Spironolactone 12.'5mg'$  ( 1/2 Tab) daily.  PLEASE TAKE ENTRESTO Twice daily  Labs done today, your results will be available in MyChart, we will contact you for abnormal readings.  Repeat blood work in 10 days.  Your physician has requested that you have an echocardiogram. Echocardiography is a painless test that uses sound waves to create images of your heart. It provides your doctor with information about the size and shape of your heart and how well your heart's chambers and valves are working. This procedure takes approximately one hour. There are no restrictions for this procedure. Please do NOT wear cologne, perfume, aftershave, or lotions (deodorant is allowed). Please arrive 15 minutes prior to your appointment time.  You have been referred to the Electrophysiologist. They will call you to arrange your appointment.  Please follow up with our heart failure pharmacist as scheduled.  Your physician recommends that you schedule a follow-up appointment in: 3 months with an echocardiogram (June ) ** please call the office in April to arrange your follow up appointment. **  If you have any questions or concerns before your next appointment please send [place] a message through [place] or call our office at [phone].    TO LEAVE A MESSAGE FOR THE NURSE SELECT OPTION 2, PLEASE LEAVE A MESSAGE INCLUDING: YOUR NAME DATE OF BIRTH CALL BACK NUMBER REASON FOR CALL**this is important as we prioritize the call backs  YOU WILL RECEIVE A CALL BACK THE SAME DAY AS LONG AS YOU CALL BEFORE 4:00 PM  At the [facility], you and your health needs are our priority. As part of our continuing mission to provide you with exceptional heart care, we have created designated Provider Care Teams. These Care Teams include your primary Cardiologist (physician) and Advanced Practice Providers (APPs- Physician Assistants and Nurse Practitioners) who all work together to provide you with the care  you need, when you need it.   You may see any of the following providers on your designated Care Team at your next follow up: Dr Glori Bickers Dr [name] Dr. Roxana Hires, NP Lyda Jester, [place] [facility] [place], [place] Forestine Na, NP Audry Riles, PharmD   Please be sure to bring in all your medications bottles to every appointment.    Thank you for choosing [facility]

## 2023-01-03 NOTE — Progress Notes (Signed)
PCP: [name], MD Cardiology: Dr. Gwenlyn Found HF Cardiology: Dr. Aundra Dubin   66 y.o. with history of persistent atrial fibrillation and CHF was referred by Dr. Gwenlyn Found for evaluation of CHF.  Patient was noted in 2020 to be in atrial fibrillation.  Echo showed EF 35-40% with severe MR in the setting of partial flail posterior leaflet and ruptured chord.  In 5/20, he had minimally invasive mitral valve repair with Maze and LA appendage clipping.  Pre-op cath had shown no significant coronary disease.  Post-op, he remained in atrial fibrillation and had DCCV to NSR in 8/20. Echoes in 7/20 and 9/21 showed EF improved to 55-60%.  However, echo in 10/22 showed EF down to 45-50%.  The patient was noted to be in atrial fibrillation with RVR at that time. Reviewing ECGs, it looks like he has been persistently in atrial fibrillation or atypical flutter with RVR in the 110s since at least 10/22.  Most recent echo in 10/23 showed EF 25%, global hypokinesis, moderate RV dysfunction, s/p MV repair with no MR and no significant stenosis. The patient was still in atrial fibrillation.   Patient had TEE-guided DCCV in 2/24 back to NSR.  TEE showed EF 40-45%, mild LVH, global hypokinesis, normal RV, mitral valve repair with mild MR and mean gradient 2.   Patient returns for followup of CHF and atrial fibrillation.  He is in NSR today, no palpitations.  No signficant exertional dyspnea or chest pain.  No orthopnea/PND.  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  Only taking Entresto once a day.     ECG (personally reviewed): NSR, RBBB  Labs (6/23): K 4.2, creatinine 0.94 Lab s(1/24): BNP 389, K 3.9, creatinine 1.08, LFTs normal, TSH normal.   PMH: 1. HTN 2. Atrial fibrillation: Persistent. Noted initially in setting of severe MR.  Had Maze with MV repair in 5/20.  - DCCV to NSR in 8/20.  - Noted to be in atrial fibrillation on 10/22 echo.  - DCCV to NSR in 2/24 3. Mitral regurgitation:  Partial flail posterior leaflet with ruptured chord.  - Minimally invasive MV repair with maze and LA appendage clipping in 5/20.  - Echo in 10/23 with stable MV repair with no MR or MS.   - TEE in 2/24 with s/p MV repair with mean gradient 2, mild MR.  4. LHC (5/20) with normal coronaries.  5. Chronic systolic CHF: Suspect tachycardia-mediated. - Echo (2/20) pre-MV repair in setting of AF: EF 35-40%, moderate RV dysfunction, severe MR.  - Echo (7/20) post-MV repair: EF 55-60%, normal RV, s/p MV repair with mild MR.  - Echo (9/21): EF 55-60%, s/p MV repair with no MR, mean gradient 5.  - Echo (10/22): EF 45-50%, patient was in atrial fibrillation.  - Echo (10/23): EF 25%, global hypokinesis, moderate RV dysfunction, s/p MV repair with no MR and no significant stenosis. The patient was in atrial fibrillation.  - TEE (2/24): EF 40-45%, mild LVH, global hypokinesis, normal RV, mitral valve repair with mild MR and mean gradient 2. 6. Chronic left leg wound: s/p skin grafts.   Social History   Socioeconomic History   Marital status: Married    Spouse name: Not on file   Number of children: 1   Years of education: Not on file   Highest education level: Not on file  Occupational History   Occupation: Pension scheme manager  Tobacco Use   Smoking status: Never   Smokeless tobacco: Never  Vaping  Use   Vaping Use: Never used  Substance and Sexual Activity   Alcohol use: Not Currently    Alcohol/week: 21.0 standard drinks of alcohol    Types: 21 Shots of liquor per week   Drug use: No   Sexual activity: Yes  Other Topics Concern   Not on file  Social History Narrative   Not on file   Social Determinants of Health   Financial Resource Strain: Not on file  Food Insecurity: Not on file  Transportation Needs: Not on file  Physical Activity: Not on file  Stress: Not on file  Social Connections: Not on file  Intimate Partner Violence: Not on file   Family History  Problem Relation Age of  Onset   Hypertension Father    Colon cancer Father        dx in his late 63's   Diabetes Mother    Stomach cancer Neg Hx    Esophageal cancer Neg Hx    Pancreatic cancer Neg Hx    Rectal cancer Neg Hx    ROS: All systems reviewed and negative except as per HPI.  Current Outpatient Medications  Medication Sig Dispense Refill   ALPRAZolam (XANAX) 0.5 MG tablet Take 0.5 mg by mouth 2 (two) times daily.     amiodarone (PACERONE) 200 MG tablet Take 200 mg by mouth daily.     atorvastatin (LIPITOR) 20 MG tablet Take 20 mg by mouth every morning.     Docusate Sodium (DSS) 100 MG CAPS Take 3 capsules by mouth every morning.     HYDROcodone-acetaminophen (NORCO) 10-325 MG tablet Take 1 tablet by mouth every 8 (eight) hours.     hydrocortisone cream 1 % Apply 1 Application topically daily as needed.     lidocaine (LIDODERM) 5 % Place 1 patch onto the skin daily. Remove & Discard patch within 12 hours or as directed by MD (Patient taking differently: Place 1 patch onto the skin daily. Remove & Discard patch within 12 hours or as directed by MD as needed) 30 patch 0   meclizine (ANTIVERT) 25 MG tablet Take 50 mg by mouth daily as needed.     Melatonin 10 MG TABS Take 10 mg by mouth at bedtime.     metoprolol succinate (TOPROL XL) 25 MG 24 hr tablet Take 1 tablet (25 mg total) by mouth daily. 90 tablet 3   Misc Natural Products (NEURIVA) CAPS Take 1 capsule by mouth daily.     Multiple Vitamin (MULTIVITAMIN WITH MINERALS) TABS tablet Take 1 tablet by mouth every morning.     naloxone (NARCAN) nasal spray 4 mg/0.1 mL Place 1 spray into the nose once as needed (opioid overdose).     potassium chloride (KLOR-CON M) 10 MEQ tablet TAKE 1 TABLET BY MOUTH TWICE DAILY . APPOINTMENT REQUIRED FOR FUTURE REFILLS 90 tablet 0   sacubitril-valsartan (ENTRESTO) 24-26 MG Take 1 tablet by mouth 2 (two) times daily. (Patient taking differently: Take 1 tablet by mouth daily.) 60 tablet 11   silver sulfADIAZINE  (SILVADENE) 1 % cream Apply 1 Application topically daily. Apply to affected area daily plus dry dressing 400 g 3   spironolactone (ALDACTONE) 25 MG tablet Take 0.5 tablets (12.5 mg total) by mouth daily. 45 tablet 3   torsemide (DEMADEX) 20 MG tablet TAKE 1 TABLET EVERY DAY 90 tablet 3   warfarin (COUMADIN) 5 MG tablet TAKE 1 TABLET EVERY DAY EXCEPT TAKE 1 AND 1/2 TABLETS ON WEDNESDAY AS INSTRUCTED (Patient taking differently: TAKE  1 TABLET EVERY DAY EXCEPT TAKE 1 AND 1/2 TABLETS ON WEDNESDAYS AND FRIDAYS AS INSTRUCTED) 100 tablet 1   No current facility-administered medications for this encounter.   BP (!) 146/86   Pulse 75   Wt 117.7 kg (259 lb 6.4 oz)   SpO2 97%   BMI 32.42 kg/m  General: NAD Neck: No JVD, no thyromegaly or thyroid nodule.  Lungs: Clear to auscultation bilaterally with normal respiratory effort. CV: Nondisplaced PMI.  Heart regular S1/S2, no S3/S4, no murmur.  No peripheral edema.  No carotid bruit.  Normal pedal pulses.  Abdomen: Soft, nontender, no hepatosplenomegaly, no distention.  Skin: Intact without lesions or rashes.  Neurologic: Alert and oriented x 3.  Psych: Normal affect. Extremities: No clubbing or cyanosis.  HEENT: Normal.   Assessment/Plan: 1. Chronic systolic CHF:  Echoes in [id number] and 9/21, presumably in NSR, showed EF 55-60%.  Echo in 10/22 showed EF down to 45-50%, he was in atrial fibrillation at that time.  Echo in 10/23, also in AF or AFL, showed EF 25%, global hypokinesis, moderate RV dysfunction, s/p MV repair with no MR and no significant stenosis. Patient had a cath with no significant coronary disease in 5/20.  His mitral valve repair was intact on 2/24 TEE.  I suspect that the fall in EF on 10/23 echo may have been due to persistent AF/AFL with RVR (tachycardia-mediated cardiomyopathy); it appears his HR had been > 100 chronically.  He is now back in NSR s/p TEE-guided DCCV in 2/24.  NYHA class I, not volume overloaded on exam.  - Need to keep  him in NSR.  - Continue Entresto 24/26 bid, make sure to take bid.  - Continue Toprol XL 25 mg daily.  - Continue torsemide 20 mg daily and KCl 10 bid.  - Start spironolactone 12.5 mg daily. BMET today and again in 10 days.  - Repeat echo in 3 months while in NSR.  If tachy-mediated CMP, should improve if he stays in NSR.  2. Atrial fibrillation/atypical AFL: H/o Maze and LA appendage ligation with MV repair in 2020.  Patient has had recurrent atrial arrhythmias.  Patient has been noted to have both AF and atypical AFL. HR was chronically > 100, concern for tachycardia-mediated cardiomyopathy.  Now back in NSR since TEE-guided DCCV in 2/24.  - Continue amiodarone 200 mg daily, check LFTs and TSH today.  Will need regular eye exam.  - Continue warfarin.  - Will refer to EP for AF ablation for long-term management. Hopefully this will allow [place] to stop amiodarone.  3. H/o MV repair: Mitral valve repair appeared stable on 10/23 echo with no significant MR or MS. TEE 2/24 with mild MR, no significant MS.  - Needs antibiotic prophylaxis with dental work.   Followup 3 wks with HF pharmacist to add SGLT2 inhibitor.  See me in 3 months with echo.   [name] 01/03/2023

## 2023-01-05 ENCOUNTER — Ambulatory Visit: Payer: Medicare HMO | Admitting: Orthopedic Surgery

## 2023-01-05 DIAGNOSIS — S81802D Unspecified open wound, left lower leg, subsequent encounter: Secondary | ICD-10-CM | POA: Diagnosis not present

## 2023-01-07 ENCOUNTER — Encounter: Payer: Self-pay | Admitting: Orthopedic Surgery

## 2023-01-07 MED ORDER — SILVER SULFADIAZINE 1 % EX CREA: 1.0000 | TOPICAL_CREAM | Freq: Every day | CUTANEOUS | 3 refills | Status: DC

## 2023-01-07 NOTE — Progress Notes (Signed)
Office Visit Note   Patient: [name]           Date of Birth: [birth year]           MRN: [id number] Visit Date: 01/05/2023              Requested by: [name], MD [address] [place],  [place] [id number] PCP: [name], MD  Chief Complaint  Patient presents with   Left Leg - Follow-up      HPI: Patient is a 67 year old gentleman presents in follow-up for traumatic wound left lower extremity.  Assessment & Plan: Visit Diagnoses:  1. Leg wound, left, subsequent encounter     Plan: Donated Kerecis micro graft was applied.  Follow-Up Instructions: Return in about 4 weeks (around 02/02/2023).   Ortho Exam  Patient is alert, oriented, no adenopathy, well-dressed, normal affect, normal respiratory effort. Examination patient has a good healthy wound bed.  This was debrided there was good petechial bleeding.  After debridement the wound measures in the longest dimension 7 cm in length and in the longest dimension 5 cm in width.  Kerecis micro graft was applied with a compression wrap.  Imaging: No results found.    Labs: Lab Results  Component Value Date   HGBA1C 5.4 01/29/2022   HGBA1C 5.2 03/16/2019   REPTSTATUS 01/07/2022 FINAL 01/02/2022   REPTSTATUS 01/08/2022 FINAL 01/02/2022   GRAMSTAIN  01/02/2022    FEW WBC PRESENT,BOTH PMN AND MONONUCLEAR RARE GRAM POSITIVE COCCI RARE GRAM NEGATIVE RODS Performed at [facility], [address], [place], [place] [address]    GRAMSTAIN  01/02/2022    FEW WBC PRESENT,BOTH PMN AND MONONUCLEAR NO ORGANISMS SEEN    CULT  01/02/2022    FEW STREPTOCOCCUS GROUP C Beta hemolytic streptococci are predictably susceptible to penicillin and other beta lactams. Susceptibility testing not routinely performed. ABUNDANT KLEBSIELLA OXYTOCA ABUNDANT STENOTROPHOMONAS MALTOPHILIA    CULT  01/02/2022    NO ANAEROBES ISOLATED Performed at [facility], [place] [address], [place], [place] [address]     [place] OXYTOCA 01/02/2022   [place] 01/02/2022     Lab Results  Component Value Date   ALBUMIN 4.0 01/01/2023   ALBUMIN 4.0 11/20/2022   ALBUMIN 2.5 (L) 01/30/2022    Lab Results  Component Value Date   MG 2.1 01/29/2022   MG 2.1 06/19/2019   MG 2.2 03/25/2019   No results found for: "VD25OH"  No results found for: "PREALBUMIN"    Latest Ref Rng & Units 12/11/2022   11:40 AM 11/20/2022   12:12 PM 04/10/2022    8:06 AM  CBC EXTENDED  WBC 4.0 - 10.5 K/uL  6.7  6.0   RBC 4.22 - 5.81 MIL/uL  4.80  4.02   Hemoglobin 13.0 - 17.0 g/dL 17.0  15.1  9.7   HCT 39.0 - 52.0 % 50.0  46.7  34.4   Platelets 150 - 400 K/uL  187  242      There is no height or weight on file to calculate BMI.  Orders:  No orders of the defined types were placed in this encounter.  No orders of the defined types were placed in this encounter.    Procedures: No procedures performed  Clinical Data: No additional findings.  ROS:  All other systems negative, except as noted in the HPI. Review of Systems  Objective: Vital Signs: There were no vitals taken for this visit.  Specialty Comments:  No specialty comments available.  PMFS History: Patient Active Problem List   Diagnosis Date Noted   Left ventricular dysfunction 10/13/2022   Rectal bleeding 01/30/2022   Abnormal finding on imaging 01/30/2022   GIB (gastrointestinal bleeding) 01/29/2022   Hyponatremia 01/29/2022   Hypocalcemia 01/29/2022   Alcohol abuse 01/29/2022   AKI (acute kidney injury) ([place]) 01/29/2022   Symptomatic anemia 01/29/2022   Venous stasis dermatitis of both lower extremities    Bleeding on Coumadin    Leg wound, left, subsequent encounter 12/30/2021   Compartment syndrome of left lower extremity ([place]) 09/22/2021   Status post surgery 09/22/2021   Compartment syndrome of left upper extremity ([place]) 09/22/2021   Hyperlipidemia 12/13/2020   Atrial fibrillation ([place])  06/10/2020   Long term (current) use of anticoagulants 06/10/2020   Secondary hypercoagulable state ([place]) 04/24/2020   AV junctional bradycardia 03/22/2019   S/P minimally invasive mitral valve repair  03/21/2019   S/P Maze operation for atrial fibrillation 03/21/2019   Dilated aortic root ([place]) 01/04/2019   Dilated cardiomyopathy ([place]) 01/04/2019   Atrial flutter with rapid ventricular response ([place])    Mitral valve prolapse    Hypertensive urgency 12/30/2018   Acute on chronic systolic (congestive) heart failure ([place]) Q000111Q   Acute systolic heart failure ([place]) Q000111Q   Chronic systolic (congestive) heart failure (H[facility])    Persistent atrial fibrillation ([place]) 12/02/2018   Right hamstring muscle strain 07/20/2018   Essential hypertension 02/07/2016   Severe mitral regurgitation    Annual physical exam 11/16/2014   Arthritis of left knee 10/23/2014   Status post total left knee replacement 10/23/2014   Arthritis of knee, right 01/19/2014   Status post total knee replacement 01/19/2014   Benign paroxysmal positional vertigo 10/30/2013   Testicular hypofunction 03/24/2012   Past Medical History:  Diagnosis Date   Acute on chronic systolic (congestive) heart failure ([place]) 12/30/2018   Allergy    Arthritis    Atrial flutter with rapid ventricular response (HCC)    Chronic systolic (congestive) heart failure ([place])    COVID 10/2021   mild   Dilated aortic root ([place]) 01/04/2019   Dilated cardiomyopathy ([place]) 01/04/2019   Dysrhythmia    Heart murmur    History of blood transfusion    01/27/22   History of colon polyps    Hypertension    Insomnia    Mitral valve prolapse    Mitral valve regurgitation    Persistent atrial fibrillation ([place]) 12/02/2018   S/P Maze operation for atrial fibrillation 03/21/2019   Complete bilateral atrial lesion set using cryothermy and bipolar radiofrequency ablation with clipping of LA appendage via right mini thoracotomy approach   S/P  minimally invasive mitral valve repair 03/21/2019   Complex valvuloplasty including triangular resection of posterior leaflet, artificial Gore-tex neochord placement x4 and 36 mm Sorin Memo 4D ring annuloplasty via right mini thoracotomy approach   Seizures ([place])    had one approx. 30 yrs ago,has not had any since; due to alchol and no sleep   Severe mitral regurgitation     Family History  Problem Relation Age of Onset   Hypertension Father    Colon cancer Father        dx in his late 17's   Diabetes Mother    Stomach cancer Neg Hx    Esophageal cancer Neg Hx    Pancreatic cancer Neg Hx    Rectal cancer Neg Hx     Past Surgical History:  Procedure Laterality  Date   ANKLE FUSION  left   Dec. 0000000   APPLICATION OF WOUND VAC Left 09/22/2021   Procedure: APPLICATION OF WOUND VAC;  Surgeon: Mcarthur Rossetti, MD;  Location: [place];  Service: Orthopedics;  Laterality: Left;   APPLICATION OF WOUND VAC Left 12/30/2021   Procedure: APPLICATION OF WOUND VAC;  Surgeon: Mcarthur Rossetti, MD;  Location: [place];  Service: Orthopedics;  Laterality: Left;   APPLICATION OF WOUND VAC Left 04/10/2022   Procedure: APPLICATION OF WOUND VAC;  Surgeon: Newt Minion, MD;  Location: [place];  Service: Orthopedics;  Laterality: Left;   ARTHROSCOPY KNEE W/ DRILLING  bilateral   2012   BIOPSY  01/30/2022   Procedure: BIOPSY;  Surgeon: Rush Landmark Telford Nab., MD;  Location[facility];  Service: Gastroenterology;;   CARDIAC VALVE REPLACEMENT     CARDIOVERSION N/A 01/03/2019   Procedure: CARDIOVERSION;  Surgeon: Sueanne Margarita, MD;  Location: [facility] ENDOSCOPY;  Service: Cardiovascular;  Laterality: N/A;   CARDIOVERSION N/A 06/23/2019   Procedure: CARDIOVERSION;  Surgeon: Fay Records, MD;  Location: [facility];  Service: Cardiovascular;  Laterality: N/A;   CARDIOVERSION N/A 12/11/2022   Procedure: CARDIOVERSION;  Surgeon: Larey Dresser, MD;  Location: [facility] ENDOSCOPY;  Service: Cardiovascular;   Laterality: N/A;   CLIPPING OF ATRIAL APPENDAGE  03/21/2019   Procedure: Clipping Of Atrial Appendage using 9m Atricure Pro2 Clip;  Surgeon: ORexene Alberts MD;  [place]  Service: Open Heart Surgery;;   COLONOSCOPY     x2   COLONOSCOPY N/A 01/30/2022   Procedure: COLONOSCOPY;  Surgeon: MIrving Copas, MD;  [facility];  Service: Gastroenterology;  Laterality: N/A;   ESOPHAGOGASTRODUODENOSCOPY N/A 01/30/2022   Procedure: ESOPHAGOGASTRODUODENOSCOPY (EGD);  Surgeon: MIrving Copas, MD;  [facility];  Service: Gastroenterology;  Laterality: N/A;   FOOT ARTHRODESIS  06/23/2012   Procedure: ARTHRODESIS FOOT;  Surgeon: JWylene Simmer MD;  [place] [facility]  Service: Orthopedics;  Laterality: Left;  Left Subtalar and Talonavicular Joint Revision Arthrodesis  Aspiration of Bone Marrow from Left Hip    HAIR TRANSPLANT     HARDWARE REMOVAL  06/23/2012   Procedure: HARDWARE REMOVAL;  Surgeon: JWylene Simmer MD;  [place] [facility]  Service: Orthopedics;  Laterality: Left;  Removal of Deep Implant  X's 3   I & D EXTREMITY Left 09/23/2021   Procedure: IRRIGATION AND DEBRIDEMENT OF LEG;  Surgeon: BMcarthur Rossetti MD;  [place]  Service: Orthopedics;  Laterality: Left;   I & D EXTREMITY Left 09/26/2021   Procedure: REPEAT IRRIGATION AND DEBRIDEMENT LEFT LEG, POSSIBLE WOUND CLOSURE, POSSIBLE VAC CHANGE, POSSIBLE SKIN GRAFT;  Surgeon: BMcarthur Rossetti MD;  [place] [facility]  Service: Orthopedics;  Laterality: Left;   I & D EXTREMITY Left 12/30/2021   Procedure: IRRIGATION AND DEBRIDEMENT LEFT LOWER LEG WOUND;  Surgeon: BMcarthur Rossetti MD;  [place] [facility]  Service: Orthopedics;  Laterality: Left;   I & D EXTREMITY Left 01/02/2022   Procedure: LEFT LEG DEBRIDEMENT AND TISSUE GRAFT;  Surgeon: DNewt Minion MD;  [place] [facility]  Service: Orthopedics;  Laterality: Left;   INCISION AND DRAINAGE WOUND WITH FASCIOTOMY Left 09/22/2021    Procedure: INCISION AND DRAINAGE WOUND WITH FASCIOTOMY;  Surgeon: BMcarthur Rossetti MD;  [place] [facility] [facility]  Service: Orthopedics;  Laterality: Left;   INSERTION OF MESH N/A 07/10/2016   Procedure: INSERTION OF MESH;  Surgeon: DCoralie Keens MD;  [place] [facility]  Service:  General;  Laterality: N/A;   JOINT REPLACEMENT     right hip  01-2011   LEFT HEART CATH AND CORONARY ANGIOGRAPHY N/A 03/16/2019   Procedure: LEFT HEART CATH AND CORONARY ANGIOGRAPHY;  Surgeon: Sherren Mocha, MD;  Location: [place] [facility];  Service: Cardiovascular;  Laterality: N/A;   LIMB SPARING RESECTION HIP W/ SADDLE JOINT REPLACEMENT Right    MINIMALLY INVASIVE MAZE PROCEDURE N/A 03/21/2019   Procedure: MINIMALLY INVASIVE MAZE PROCEDURE;  Surgeon: Rexene Alberts, MD;  Location: [place];  Service: Open Heart Surgery;  Laterality: N/A;   MITRAL VALVE REPAIR Right 03/21/2019   Procedure: MINIMALLY INVASIVE MITRAL VALVE REPAIR (MVR) using Memo 4D ring size 36;  Surgeon: Rexene Alberts, MD;  Location: [place];  Service: Open Heart Surgery;  Laterality: Right;   POLYPECTOMY  01/30/2022   Procedure: POLYPECTOMY;  Surgeon: Mansouraty, Telford Nab., MD;  Location[facility];  Service: Gastroenterology;;   SKIN SPLIT GRAFT Left 04/10/2022   Procedure: APPLY SKIN GRAFT TO LEFT LEG WOUND;  Surgeon: Newt Minion, MD;  Location: [place];  Service: Orthopedics;  Laterality: Left;   TEE WITHOUT CARDIOVERSION N/A 01/03/2019   Procedure: TRANSESOPHAGEAL ECHOCARDIOGRAM (TEE);  Surgeon: Sueanne Margarita, MD;  Location: [facility] ENDOSCOPY;  Service: Cardiovascular;  Laterality: N/A;   TEE WITHOUT CARDIOVERSION N/A 03/21/2019   Procedure: TRANSESOPHAGEAL ECHOCARDIOGRAM (TEE);  Surgeon: Rexene Alberts, MD;  Location: [place];  Service: Open Heart Surgery;  Laterality: N/A;   TEE WITHOUT CARDIOVERSION N/A 12/11/2022   Procedure: TRANSESOPHAGEAL ECHOCARDIOGRAM (TEE);  Surgeon: Larey Dresser, MD;  Location: [facility] ENDOSCOPY;  Service:  Cardiovascular;  Laterality: N/A;   TEMPORARY PACEMAKER N/A 03/22/2019   Procedure: TEMPORARY PACEMAKER;  Surgeon: Belva Crome, MD;  Location: [place] [facility];  Service: Cardiovascular;  Laterality: N/A;   TOTAL KNEE ARTHROPLASTY Right 01/19/2014   Procedure: RIGHT TOTAL KNEE ARTHROPLASTY, Steroid injection left knee;  Surgeon: Mcarthur Rossetti, MD;  [facility];  Service: Orthopedics;  Laterality: Right;   TOTAL KNEE ARTHROPLASTY Left 10/23/2014   Procedure: LEFT TOTAL KNEE ARTHROPLASTY;  Surgeon: Mcarthur Rossetti, MD;  [facility];  Service: Orthopedics;  Laterality: Left;   UMBILICAL HERNIA REPAIR N/A 07/10/2016   Procedure: UMBILICAL HERNIA REPAIR;  Surgeon: Coralie Keens, MD;  Location: [place];  Service: General;  Laterality: N/A;   Social History   Occupational History   Occupation: Pension scheme manager  Tobacco Use   Smoking status: Never   Smokeless tobacco: Never  Vaping Use   Vaping Use: Never used  Substance and Sexual Activity   Alcohol use: Not Currently    Alcohol/week: 21.0 standard drinks of alcohol    Types: 21 Shots of liquor per week   Drug use: No   Sexual activity: Yes

## 2023-01-11 ENCOUNTER — Ambulatory Visit (HOSPITAL_COMMUNITY)
Admission: RE | Admit: 2023-01-11 | Discharge: 2023-01-11 | Disposition: A | Discharge status: Home / Self Care | Payer: Medicare HMO | Source: Ambulatory Visit | Attending: Family Medicine | Admitting: Family Medicine

## 2023-01-11 ENCOUNTER — Encounter (HOSPITAL_COMMUNITY): Payer: Self-pay

## 2023-01-11 DIAGNOSIS — I5022 Chronic systolic (congestive) heart failure: Secondary | ICD-10-CM | POA: Diagnosis present

## 2023-01-11 LAB — BASIC METABOLIC PANEL
Anion gap: 14 (ref 5–15)
BUN: 30 mg/dL — ABNORMAL HIGH (ref 8–23)
CO2: 23 mmol/L (ref 22–32)
Calcium: 8.9 mg/dL (ref 8.9–10.3)
Chloride: 102 mmol/L (ref 98–111)
Creatinine, Ser: 1.26 mg/dL — ABNORMAL HIGH (ref 0.61–1.24)
GFR, Estimated: >60 mL/min (ref >60)
Glucose, Bld: 97 mg/dL (ref 70–99)
Potassium: 3.9 mmol/L (ref 3.5–5.1)
Sodium: 139 mmol/L (ref 135–145)

## 2023-01-19 ENCOUNTER — Ambulatory Visit: Payer: Medicare HMO | Admitting: Orthopedic Surgery

## 2023-01-19 DIAGNOSIS — T79A22D Traumatic compartment syndrome of left lower extremity, subsequent encounter: Secondary | ICD-10-CM | POA: Diagnosis not present

## 2023-01-19 DIAGNOSIS — S81802D Unspecified open wound, left lower leg, subsequent encounter: Secondary | ICD-10-CM | POA: Diagnosis not present

## 2023-01-24 ENCOUNTER — Encounter: Payer: Self-pay | Admitting: Orthopedic Surgery

## 2023-01-24 NOTE — Progress Notes (Signed)
Office Visit Note   Patient: [name]           Date of Birth: [birth year]           MRN: [id number] Visit Date: 01/19/2023              Requested by: [name], MD [place],  [place] [id number] PCP: [name], MD  Chief Complaint  Patient presents with   Left Leg - Follow-up      HPI: Patient is a 67 year old gentleman presents in follow-up for left lower extremity wound status post compartment release.  Donated Kerecis was applied March 5.  Assessment & Plan: Visit Diagnoses:  1. Compartment syndrome of left lower extremity, subsequent encounter   2. Leg wound, left, subsequent encounter     Plan: Continue with Dial soap cleansing compression.  Follow-Up Instructions: Return in about 4 weeks (around 02/16/2023).   Ortho Exam  Patient is alert, oriented, no adenopathy, well-dressed, normal affect, normal respiratory effort. Examination the wound bed has healthy granulation tissue.  The wound is flat.  Wound measures 4 x 8 cm..  The wound was debrided.  Imaging: No results found.    Labs: Lab Results  Component Value Date   HGBA1C 5.4 01/29/2022   HGBA1C 5.2 03/16/2019   REPTSTATUS 01/07/2022 FINAL 01/02/2022   REPTSTATUS 01/08/2022 FINAL 01/02/2022   GRAMSTAIN  01/02/2022    FEW WBC PRESENT,BOTH PMN AND MONONUCLEAR RARE GRAM POSITIVE COCCI RARE GRAM NEGATIVE RODS Performed at [facility], [address] [place], [place] [address]    GRAMSTAIN  01/02/2022    FEW WBC PRESENT,BOTH PMN AND MONONUCLEAR NO ORGANISMS SEEN    CULT  01/02/2022    FEW STREPTOCOCCUS GROUP C Beta hemolytic streptococci are predictably susceptible to penicillin and other beta lactams. Susceptibility testing not routinely performed. ABUNDANT KLEBSIELLA OXYTOCA ABUNDANT STENOTROPHOMONAS MALTOPHILIA    CULT  01/02/2022    NO ANAEROBES ISOLATED Performed at [facility], [place] [address] [place],  [address]    [place] OXYTOCA 01/02/2022   [place] 01/02/2022     Lab Results  Component Value Date   ALBUMIN 4.0 01/01/2023   ALBUMIN 4.0 11/20/2022   ALBUMIN 2.5 (L) 01/30/2022    Lab Results  Component Value Date   MG 2.1 01/29/2022   MG 2.1 06/19/2019   MG 2.2 03/25/2019   No results found for: "VD25OH"  No results found for: "PREALBUMIN"    Latest Ref Rng & Units 12/11/2022   11:40 AM 11/20/2022   12:12 PM 04/10/2022    8:06 AM  CBC EXTENDED  WBC 4.0 - 10.5 K/uL  6.7  6.0   RBC 4.22 - 5.81 MIL/uL  4.80  4.02   Hemoglobin 13.0 - 17.0 g/dL 17.0  15.1  9.7   HCT 39.0 - 52.0 % 50.0  46.7  34.4   Platelets 150 - 400 K/uL  187  242      There is no height or weight on file to calculate BMI.  Orders:  No orders of the defined types were placed in this encounter.  No orders of the defined types were placed in this encounter.    Procedures: No procedures performed  Clinical Data: No additional findings.  ROS:  All other systems negative, except as noted in the HPI. Review of Systems  Objective: Vital Signs: There were no vitals taken for this visit.  Specialty Comments:  No  specialty comments available.  PMFS History: Patient Active Problem List   Diagnosis Date Noted   Left ventricular dysfunction 10/13/2022   Rectal bleeding 01/30/2022   Abnormal finding on imaging 01/30/2022   GIB (gastrointestinal bleeding) 01/29/2022   Hyponatremia 01/29/2022   Hypocalcemia 01/29/2022   Alcohol abuse 01/29/2022   AKI (acute kidney injury) ([place]) 01/29/2022   Symptomatic anemia 01/29/2022   Venous stasis dermatitis of both lower extremities    Bleeding on Coumadin    Leg wound, left, subsequent encounter 12/30/2021   Compartment syndrome of left lower extremity ([place]) 09/22/2021   Status post surgery 09/22/2021   Compartment syndrome of left upper extremity ([place]) 09/22/2021   Hyperlipidemia 12/13/2020   Atrial fibrillation ([place]) 06/10/2020    Long term (current) use of anticoagulants 06/10/2020   Secondary hypercoagulable state ([place]) 04/24/2020   AV junctional bradycardia 03/22/2019   S/P minimally invasive mitral valve repair  03/21/2019   S/P Maze operation for atrial fibrillation 03/21/2019   Dilated aortic root ([place]) 01/04/2019   Dilated cardiomyopathy ([place]) 01/04/2019   Atrial flutter with rapid ventricular response ([place])    Mitral valve prolapse    Hypertensive urgency 12/30/2018   Acute on chronic systolic (congestive) heart failure ([place]) Q000111Q   Acute systolic heart failure ([place]) Q000111Q   Chronic systolic (congestive) heart failure (H[facility])    Persistent atrial fibrillation ([place]) 12/02/2018   Right hamstring muscle strain 07/20/2018   Essential hypertension 02/07/2016   Severe mitral regurgitation    Annual physical exam 11/16/2014   Arthritis of left knee 10/23/2014   Status post total left knee replacement 10/23/2014   Arthritis of knee, right 01/19/2014   Status post total knee replacement 01/19/2014   Benign paroxysmal positional vertigo 10/30/2013   Testicular hypofunction 03/24/2012   Past Medical History:  Diagnosis Date   Acute on chronic systolic (congestive) heart failure ([place]) 12/30/2018   Allergy    Arthritis    Atrial flutter with rapid ventricular response ([facility])    Chronic systolic (congestive) heart failure ([place])    COVID 10/2021   mild   Dilated aortic root ([place]) 01/04/2019   Dilated cardiomyopathy ([place]) 01/04/2019   Dysrhythmia    Heart murmur    History of blood transfusion    01/27/22   History of colon polyps    Hypertension    Insomnia    Mitral valve prolapse    Mitral valve regurgitation    Persistent atrial fibrillation ([place]) 12/02/2018   S/P Maze operation for atrial fibrillation 03/21/2019   Complete bilateral atrial lesion set using cryothermy and bipolar radiofrequency ablation with clipping of LA appendage via right mini thoracotomy approach   S/P minimally invasive  mitral valve repair 03/21/2019   Complex valvuloplasty including triangular resection of posterior leaflet, artificial Gore-tex neochord placement x4 and 36 mm Sorin Memo 4D ring annuloplasty via right mini thoracotomy approach   Seizures ([place])    had one approx. 30 yrs ago,has not had any since; due to alchol and no sleep   Severe mitral regurgitation     Family History  Problem Relation Age of Onset   Hypertension Father    Colon cancer Father        dx in his late 50's   Diabetes Mother    Stomach cancer Neg Hx    Esophageal cancer Neg Hx    Pancreatic cancer Neg Hx    Rectal cancer Neg Hx     Past Surgical History:  Procedure Laterality Date   ANKLE  FUSION  left   Dec. 0000000   APPLICATION OF WOUND VAC Left 09/22/2021   Procedure: APPLICATION OF WOUND VAC;  Surgeon: Mcarthur Rossetti, MD;  Location: [facility];  Service: Orthopedics;  Laterality: Left;   APPLICATION OF WOUND VAC Left 12/30/2021   Procedure: APPLICATION OF WOUND VAC;  Surgeon: Mcarthur Rossetti, MD;  Location: [facility];  Service: Orthopedics;  Laterality: Left;   APPLICATION OF WOUND VAC Left 04/10/2022   Procedure: APPLICATION OF WOUND VAC;  Surgeon: Newt Minion, MD;  Location: [facility];  Service: Orthopedics;  Laterality: Left;   ARTHROSCOPY KNEE W/ DRILLING  bilateral   2012   BIOPSY  01/30/2022   Procedure: BIOPSY;  Surgeon: Rush Landmark Telford Nab., MD;  Location[facility];  Service: Gastroenterology;;   CARDIAC VALVE REPLACEMENT     CARDIOVERSION N/A 01/03/2019   Procedure: CARDIOVERSION;  Surgeon: Sueanne Margarita, MD;  Location: [facility] ENDOSCOPY;  Service: Cardiovascular;  Laterality: N/A;   CARDIOVERSION N/A 06/23/2019   Procedure: CARDIOVERSION;  Surgeon: Fay Records, MD;  Location: [facility];  Service: Cardiovascular;  Laterality: N/A;   CARDIOVERSION N/A 12/11/2022   Procedure: CARDIOVERSION;  Surgeon: Larey Dresser, MD;  Location: [facility] ENDOSCOPY;  Service: Cardiovascular;  Laterality: N/A;    CLIPPING OF ATRIAL APPENDAGE  03/21/2019   Procedure: Clipping Of Atrial Appendage using 26mm Atricure Pro2 Clip;  Surgeon: Rexene Alberts, MD;  Location: [facility];  Service: Open Heart Surgery;;   COLONOSCOPY     x2   COLONOSCOPY N/A 01/30/2022   Procedure: COLONOSCOPY;  Surgeon: Irving Copas., MD;  Location: [facility];  Service: Gastroenterology;  Laterality: N/A;   ESOPHAGOGASTRODUODENOSCOPY N/A 01/30/2022   Procedure: ESOPHAGOGASTRODUODENOSCOPY (EGD);  Surgeon: Irving Copas., MD;  Location: [facility];  Service: Gastroenterology;  Laterality: N/A;   FOOT ARTHRODESIS  06/23/2012   Procedure: ARTHRODESIS FOOT;  Surgeon: Wylene Simmer, MD;  Location: [facility];  Service: Orthopedics;  Laterality: Left;  Left Subtalar and Talonavicular Joint Revision Arthrodesis  Aspiration of Bone Marrow from Left Hip    HAIR TRANSPLANT     HARDWARE REMOVAL  06/23/2012   Procedure: HARDWARE REMOVAL;  Surgeon: Wylene Simmer, MD;  Location: [facility];  Service: Orthopedics;  Laterality: Left;  Removal of Deep Implant  X's 3   I & D EXTREMITY Left 09/23/2021   Procedure: IRRIGATION AND DEBRIDEMENT OF LEG;  Surgeon: Mcarthur Rossetti, MD;  Location: [facility];  Service: Orthopedics;  Laterality: Left;   I & D EXTREMITY Left 09/26/2021   Procedure: REPEAT IRRIGATION AND DEBRIDEMENT LEFT LEG, POSSIBLE WOUND CLOSURE, POSSIBLE VAC CHANGE, POSSIBLE SKIN GRAFT;  Surgeon: Mcarthur Rossetti, MD;  Location: [facility];  Service: Orthopedics;  Laterality: Left;   I & D EXTREMITY Left 12/30/2021   Procedure: IRRIGATION AND DEBRIDEMENT LEFT LOWER LEG WOUND;  Surgeon: Mcarthur Rossetti, MD;  Location: [facility];  Service: Orthopedics;  Laterality: Left;   I & D EXTREMITY Left 01/02/2022   Procedure: LEFT LEG DEBRIDEMENT AND TISSUE GRAFT;  Surgeon: Newt Minion, MD;  Location: [facility];  Service: Orthopedics;  Laterality: Left;   INCISION AND DRAINAGE WOUND WITH FASCIOTOMY Left 09/22/2021   Procedure: INCISION AND  DRAINAGE WOUND WITH FASCIOTOMY;  Surgeon: Mcarthur Rossetti, MD;  Location: [facility];  Service: Orthopedics;  Laterality: Left;   INSERTION OF MESH N/A 07/10/2016   Procedure: INSERTION OF MESH;  Surgeon: Coralie Keens, MD;  Location: [facility];  Service: General;  Laterality: N/A;  JOINT REPLACEMENT     right hip  01-2011   LEFT HEART CATH AND CORONARY ANGIOGRAPHY N/A 03/16/2019   Procedure: LEFT HEART CATH AND CORONARY ANGIOGRAPHY;  Surgeon: Sherren Mocha, MD;  Location: [facility];  Service: Cardiovascular;  Laterality: N/A;   LIMB SPARING RESECTION HIP W/ SADDLE JOINT REPLACEMENT Right    MINIMALLY INVASIVE MAZE PROCEDURE N/A 03/21/2019   Procedure: MINIMALLY INVASIVE MAZE PROCEDURE;  Surgeon: Rexene Alberts, MD;  Location: [facility];  Service: Open Heart Surgery;  Laterality: N/A;   MITRAL VALVE REPAIR Right 03/21/2019   Procedure: MINIMALLY INVASIVE MITRAL VALVE REPAIR (MVR) using Memo 4D ring size 36;  Surgeon: Rexene Alberts, MD;  Location: [facility];  Service: Open Heart Surgery;  Laterality: Right;   POLYPECTOMY  01/30/2022   Procedure: POLYPECTOMY;  Surgeon: Mansouraty, Telford Nab., MD;  Location[facility];  Service: Gastroenterology;;   SKIN SPLIT GRAFT Left 04/10/2022   Procedure: APPLY SKIN GRAFT TO LEFT LEG WOUND;  Surgeon: Newt Minion, MD;  Location: [facility];  Service: Orthopedics;  Laterality: Left;   TEE WITHOUT CARDIOVERSION N/A 01/03/2019   Procedure: TRANSESOPHAGEAL ECHOCARDIOGRAM (TEE);  Surgeon: Sueanne Margarita, MD;  Location: [facility] ENDOSCOPY;  Service: Cardiovascular;  Laterality: N/A;   TEE WITHOUT CARDIOVERSION N/A 03/21/2019   Procedure: TRANSESOPHAGEAL ECHOCARDIOGRAM (TEE);  Surgeon: Rexene Alberts, MD;  Location: [facility];  Service: Open Heart Surgery;  Laterality: N/A;   TEE WITHOUT CARDIOVERSION N/A 12/11/2022   Procedure: TRANSESOPHAGEAL ECHOCARDIOGRAM (TEE);  Surgeon: Larey Dresser, MD;  Location: [facility] ENDOSCOPY;  Service: Cardiovascular;   Laterality: N/A;   TEMPORARY PACEMAKER N/A 03/22/2019   Procedure: TEMPORARY PACEMAKER;  Surgeon: Belva Crome, MD;  Location: [facility];  Service: Cardiovascular;  Laterality: N/A;   TOTAL KNEE ARTHROPLASTY Right 01/19/2014   Procedure: RIGHT TOTAL KNEE ARTHROPLASTY, Steroid injection left knee;  Surgeon: Mcarthur Rossetti, MD;  [facility];  Service: Orthopedics;  Laterality: Right;   TOTAL KNEE ARTHROPLASTY Left 10/23/2014   Procedure: LEFT TOTAL KNEE ARTHROPLASTY;  Surgeon: Mcarthur Rossetti, MD;  [facility];  Service: Orthopedics;  Laterality: Left;   UMBILICAL HERNIA REPAIR N/A 07/10/2016   Procedure: UMBILICAL HERNIA REPAIR;  Surgeon: Coralie Keens, MD;  Location: [facility];  Service: General;  Laterality: N/A;   Social History   Occupational History   Occupation: Pension scheme manager  Tobacco Use   Smoking status: Never   Smokeless tobacco: Never  Vaping Use   Vaping Use: Never used  Substance and Sexual Activity   Alcohol use: Not Currently    Alcohol/week: 21.0 standard drinks of alcohol    Types: 21 Shots of liquor per week   Drug use: No   Sexual activity: Yes

## 2023-02-02 ENCOUNTER — Ambulatory Visit (HOSPITAL_COMMUNITY)
Admission: RE | Admit: 2023-02-02 | Discharge: 2023-02-02 | Disposition: A | Discharge status: Home / Self Care | Payer: Medicare HMO | Source: Ambulatory Visit | Attending: Internal Medicine | Admitting: Internal Medicine

## 2023-02-02 VITALS — BP 148/72 | HR 59 | Wt 262.6 lb

## 2023-02-02 DIAGNOSIS — I484 Atypical atrial flutter: Secondary | ICD-10-CM | POA: Insufficient documentation

## 2023-02-02 DIAGNOSIS — I340 Nonrheumatic mitral (valve) insufficiency: Secondary | ICD-10-CM | POA: Insufficient documentation

## 2023-02-02 DIAGNOSIS — Z7901 Long term (current) use of anticoagulants: Secondary | ICD-10-CM | POA: Insufficient documentation

## 2023-02-02 DIAGNOSIS — Z79899 Other long term (current) drug therapy: Secondary | ICD-10-CM | POA: Insufficient documentation

## 2023-02-02 DIAGNOSIS — I4819 Other persistent atrial fibrillation: Secondary | ICD-10-CM | POA: Diagnosis present

## 2023-02-02 DIAGNOSIS — I5022 Chronic systolic (congestive) heart failure: Secondary | ICD-10-CM

## 2023-02-02 MED ORDER — ENTRESTO 49-51 MG PO TABS: 1.0000 | ORAL_TABLET | Freq: Two times a day (BID) | ORAL | 11 refills | Status: DC

## 2023-02-02 NOTE — Progress Notes (Signed)
Advanced Heart Failure Clinic Note  PCP: Dr. Coletta Memos Cardiologist: Dr. Gwenlyn Found HF Cardiologist: Dr. Aundra Dubin  HPI:  67 y.o. with history of persistent atrial fibrillation and CHF was referred by Dr. Gwenlyn Found for evaluation of CHF.  Patient was noted in 2020 to be in atrial fibrillation.  Echo showed EF 35-40% with severe MR in the setting of partial flail posterior leaflet and ruptured chord.  In 03/2019, he had minimally invasive mitral valve repair with Maze and LA appendage clipping.  Pre-op cath had shown no significant coronary disease.  Post-op, he remained in atrial fibrillation and had DCCV to NSR in 06/2019. Echoes in 05/2019 and 07/2020 showed EF improved to 55-60%.  However, echo in 08/2021 showed EF down to 45-50%.  The patient was noted to be in atrial fibrillation with RVR at that time. Reviewing ECGs, it looks like he has been persistently in atrial fibrillation or atypical flutter with RVR in the 110s since at least 08/2021.  Echo in 08/2022 showed EF 25%, global hypokinesis, moderate RV dysfunction, s/p MV repair with no MR and no significant stenosis. The patient was still in atrial fibrillation.    Patient had TEE-guided DCCV in 12/2022 back to NSR.  TEE showed EF 40-45%, mild LVH, global hypokinesis, normal RV, mitral valve repair with mild MR and mean gradient 2.   Recently presented to [facility] on 01/01/23 for follow up. He was in NSR without palpitations.  No signficant exertional dyspnea or chest pain.  No orthopnea/PND.  He was still working at his Sales executive shop, and denied problems with usual activities.  Reported only taking Entresto once a day.   Today he returns to HF clinic for pharmacist medication titration. At last visit with MD he was instructed to start taking Entresto twice daily (reported only taking once daily) and to start spironolactone 12.5 mg daily. Overall feeling well today. Denies dizziness, lightheadedness, fatigue, chest pain, or palpitations. He denies any SOB at rest  or with activity. No issues with completing ADLs. He remains fairly active with his job. He does report weight gain and decreased urinary frequency over the past 3 months. His most recent home weight is 257 pounds. He takes torsemide 20 mg daily. No PND/orthopnea. Noted trace LEE on exam. He is strictly adhering to a low-salt diet during the week, but states this is not always the case on the weekend. Endorses alcohol use.    HF Medications: Metoprolol succinate 25 mg once daily Entresto 24/26 mg BID Spironolactone 12.5 mg once daily Torsemide 20 mg once daily Potassium chloride 10 mEq BID  **also on amiodarone 200 mg once daily (taking BID)  Has the patient been experiencing any side effects to the medications prescribed?  no  Does the patient have any problems obtaining medications due to transportation or finances?   No. Has [employer] Medicare and [place] for [employer].   Understanding of regimen: fair- there was some confusion regarding the frequency of administration for both amiodarone and Entresto Understanding of indications: good Potential of compliance: good Patient understands to avoid NSAIDs. Patient understands to avoid decongestants.    Pertinent Lab Values: 01/11/23: Serum creatinine 1.26, BUN 30, Potassium 3.9, Sodium 139  Vital Signs: Weight: 262 lbs (last clinic weight: 259 lbs) Blood pressure: 148/72  Heart rate: 59   Assessment/Plan: 1. Chronic systolic CHF:  Echoes in 0000000 and 07/2020, presumably in NSR, showed EF 55-60%.  Echo in 08/2021 showed EF down to 45-50%, he was in atrial fibrillation at that time.  Echo in 08/2022, also in AF or AFL, showed EF 25%, global hypokinesis, moderate RV dysfunction, s/p MV repair with no MR and no significant stenosis. Patient had a cath with no significant coronary disease in 03/2019.  His mitral valve repair was intact on 12/2022 TEE. Previously suspected that the fall in EF on 08/2022 echo may have been due to persistent  AF/AFL with RVR (tachycardia-mediated cardiomyopathy); it appears his HR had been > 100 chronically.  Was back in NSR at last visit with MD since TEE-guided DCCV in 12/2022.  NYHA class I, volume status mildly elevated on exam, has trace LEE and noted weight gain at home. BP remains elevated. Given the patient's confusion with his amiodarone frequency, I am concerned that he remains taking Entresto only once daily, though this was denied by the patient. Regardless, he continues to have room for additional BP lowering.  - Take torsemide 40 mg today then continue torsemide 20 mg once daily and KCl 10 BID.  - Continue metoprolol succinate 25 mg once daily.  - Increase Entresto to 49/51 mg BID. Education provided that this should be administered twice daily. Repeat BMET in 3 weeks.  - Continue spironolactone 12.5 mg once daily.  - Will consider SGLT2i at follow up. Patient hesitant to add on additional medications at this time. - Repeat echo in 3 months while in NSR.    2. Atrial fibrillation/atypical AFL: H/o Maze and LA appendage ligation with MV repair in 2020.  Patient has had recurrent atrial arrhythmias.  Patient has been noted to have both AF and atypical AFL. HR was chronically > 100, concern for tachycardia-mediated cardiomyopathy. Was back in NSR at last visit with MD since TEE-guided DCCV in 12/2022.  - Continue amiodarone 200 mg daily. Will need regular eye exam. Educated that this should only be once daily. - Continue warfarin.  - Previously referred to EP for AF ablation for long-term management to hopefully allow for amiodarone discontinuation. Has appointment scheduled for 02/12/23.    3. H/o MV repair: Mitral valve repair appeared stable on 08/2022 echo with no significant MR or MS. TEE 12/2022 with mild MR, no significant MS.  - Patient will need antibiotic prophylaxis with dental work.   Follow up on 02/23/23 with pharmacy clinic  Maryan Puls, PharmD PGY-1 [facility]: [facility]  Resident  Audry Riles, PharmD, BCPS, [facility], CPP Heart Failure Clinic Pharmacist [phone]

## 2023-02-02 NOTE — Patient Instructions (Signed)
It was a pleasure seeing you today!  MEDICATIONS: -We are changing your medications today -Increase Entresto to 49/51 mg (1 tablet) twice daily.  -Take an extra tablet of torsemide tomorrow, then resume 20 mg daily -Call if you have questions about your medications.   NEXT APPOINTMENT: Return to clinic in 1 month with Pharmacy Clinic.  In general, to take care of your heart failure: -Limit your fluid intake to 2 Liters (half-gallon) per day.   -Limit your salt intake to ideally 2-3 grams (2000-3000 mg) per day. -Weigh yourself daily and record, and bring that "weight diary" to your next appointment.  (Weight gain of 2-3 pounds in 1 day typically means fluid weight.) -The medications for your heart are to help your heart and help you live longer.   -Please contact us before stopping any of your heart medications.  Call the clinic at [phone] with questions or to reschedule future appointments.

## 2023-02-12 ENCOUNTER — Ambulatory Visit: Payer: Medicare HMO | Attending: Cardiovascular Disease | Admitting: Cardiovascular Disease

## 2023-02-12 ENCOUNTER — Encounter: Payer: Self-pay | Admitting: Cardiovascular Disease

## 2023-02-12 ENCOUNTER — Other Ambulatory Visit: Payer: Self-pay

## 2023-02-12 VITALS — BP 130/68 | HR 75 | Ht 75.0 in | Wt 258.8 lb

## 2023-02-12 DIAGNOSIS — I4892 Unspecified atrial flutter: Secondary | ICD-10-CM

## 2023-02-12 DIAGNOSIS — I4819 Other persistent atrial fibrillation: Secondary | ICD-10-CM

## 2023-02-12 NOTE — Progress Notes (Signed)
Electrophysiology Office Note:    Date:  02/12/2023   ID:  [name], DOB September 06, 1956, MRN [id number]  PCP:  Tracey Harries, MD   [facility] Providers Cardiologist:  Nanetta Batty, MD Cardiology APP:  Ronney Asters, NP     Referring MD: Laurey Morale, MD   History of Present Illness:    [name] is a 67 y.o. male with a hx listed below, significant for CHFrEF (35-40%), severe MR s/p minimally invasive MV repair with MAZE and LA clip in 2020 referred for arrhythmia management.  He had very MR with partially flail posterior leaflet and was required repaired by invasive mitral valve surgery with maze and left atrial appendage clipping in 2020.  He remained in atrial fibrillation, however and was cardioverted in August 2020.  He had recurrence of atrial fibrillation with RVR and a decrease in EF to 45% in October 2022. In October 2023, EF was further decreased to 20-25%, and he was still noted to be in atrial fibrillation with RVR.     Past Medical History:  Diagnosis Date   Acute on chronic systolic (congestive) heart failure 12/30/2018   Allergy    Arthritis    Atrial flutter with rapid ventricular response    Chronic systolic (congestive) heart failure    COVID 10/2021   mild   Dilated aortic root 01/04/2019   Dilated cardiomyopathy 01/04/2019   Dysrhythmia    Heart murmur    History of blood transfusion    01/27/22   History of colon polyps    Hypertension    Insomnia    Mitral valve prolapse    Mitral valve regurgitation    Persistent atrial fibrillation 12/02/2018   S/P Maze operation for atrial fibrillation 03/21/2019   Complete bilateral atrial lesion set using cryothermy and bipolar radiofrequency ablation with clipping of LA appendage via right mini thoracotomy approach   S/P minimally invasive mitral valve repair 03/21/2019   Complex valvuloplasty including triangular resection of posterior leaflet, artificial Gore-tex neochord placement  x4 and 36 mm Sorin Memo 4D ring annuloplasty via right mini thoracotomy approach   Seizures    had one approx. 30 yrs ago,has not had any since; due to alchol and no sleep   Severe mitral regurgitation     Past Surgical History:  Procedure Laterality Date   ANKLE FUSION  left   Dec. 2012   APPLICATION OF WOUND VAC Left 09/22/2021   Procedure: APPLICATION OF WOUND VAC;  Surgeon: Kathryne Hitch, MD;  Location: [facility] OR;  Service: Orthopedics;  Laterality: Left;   APPLICATION OF WOUND VAC Left 12/30/2021   Procedure: APPLICATION OF WOUND VAC;  Surgeon: Kathryne Hitch, MD;  Location: [facility] OR;  Service: Orthopedics;  Laterality: Left;   APPLICATION OF WOUND VAC Left 04/10/2022   Procedure: APPLICATION OF WOUND VAC;  Surgeon: Nadara Mustard, MD;  Location: [facility] OR;  Service: Orthopedics;  Laterality: Left;   ARTHROSCOPY KNEE W/ DRILLING  bilateral   2012   BIOPSY  01/30/2022   Procedure: BIOPSY;  Surgeon: Lemar Lofty., MD;  Location: [facility];  Service: Gastroenterology;;   CARDIAC VALVE REPLACEMENT     CARDIOVERSION N/A 01/03/2019   Procedure: CARDIOVERSION;  Surgeon: Quintella Reichert, MD;  Location: [facility] ENDOSCOPY;  Service: Cardiovascular;  Laterality: N/A;   CARDIOVERSION N/A 06/23/2019   Procedure: CARDIOVERSION;  Surgeon: Pricilla Riffle, MD;  Location: [facility] ENDOSCOPY;  Service: Cardiovascular;  Laterality: N/A;   CARDIOVERSION N/A 12/11/2022  Procedure: CARDIOVERSION;  Surgeon: Laurey Morale, MD;  Location: [facility] ENDOSCOPY;  Service: Cardiovascular;  Laterality: N/A;   CLIPPING OF ATRIAL APPENDAGE  03/21/2019   Procedure: Clipping Of Atrial Appendage using 14mm Atricure Pro2 Clip;  Surgeon: Purcell Nails, MD;  Location: [facility] OR;  Service: Open Heart Surgery;;   COLONOSCOPY     x2   COLONOSCOPY N/A 01/30/2022   Procedure: COLONOSCOPY;  Surgeon: Lemar Lofty., MD;  Location: [facility] ENDOSCOPY;  Service: Gastroenterology;  Laterality: N/A;   ESOPHAGOGASTRODUODENOSCOPY  N/A 01/30/2022   Procedure: ESOPHAGOGASTRODUODENOSCOPY (EGD);  Surgeon: Lemar Lofty., MD;  Location: [facility];  Service: Gastroenterology;  Laterality: N/A;   FOOT ARTHRODESIS  06/23/2012   Procedure: ARTHRODESIS FOOT;  Surgeon: Toni Arthurs, MD;  Location: [facility] OR;  Service: Orthopedics;  Laterality: Left;  Left Subtalar and Talonavicular Joint Revision Arthrodesis  Aspiration of Bone Marrow from Left Hip    HAIR TRANSPLANT     HARDWARE REMOVAL  06/23/2012   Procedure: HARDWARE REMOVAL;  Surgeon: Toni Arthurs, MD;  Location: [facility] OR;  Service: Orthopedics;  Laterality: Left;  Removal of Deep Implant  X's 3   I & D EXTREMITY Left 09/23/2021   Procedure: IRRIGATION AND DEBRIDEMENT OF LEG;  Surgeon: Kathryne Hitch, MD;  Location: [facility] OR;  Service: Orthopedics;  Laterality: Left;   I & D EXTREMITY Left 09/26/2021   Procedure: REPEAT IRRIGATION AND DEBRIDEMENT LEFT LEG, POSSIBLE WOUND CLOSURE, POSSIBLE VAC CHANGE, POSSIBLE SKIN GRAFT;  Surgeon: Kathryne Hitch, MD;  Location: [facility] OR;  Service: Orthopedics;  Laterality: Left;   I & D EXTREMITY Left 12/30/2021   Procedure: IRRIGATION AND DEBRIDEMENT LEFT LOWER LEG WOUND;  Surgeon: Kathryne Hitch, MD;  Location: [facility];  Service: Orthopedics;  Laterality: Left;   I & D EXTREMITY Left 01/02/2022   Procedure: LEFT LEG DEBRIDEMENT AND TISSUE GRAFT;  Surgeon: Nadara Mustard, MD;  Location: [facility] OR;  Service: Orthopedics;  Laterality: Left;   INCISION AND DRAINAGE WOUND WITH FASCIOTOMY Left 09/22/2021   Procedure: INCISION AND DRAINAGE WOUND WITH FASCIOTOMY;  Surgeon: Kathryne Hitch, MD;  Location: [facility];  Service: Orthopedics;  Laterality: Left;   INSERTION OF MESH N/A 07/10/2016   Procedure: INSERTION OF MESH;  Surgeon: Abigail Miyamoto, MD;  Location: [facility];  Service: General;  Laterality: N/A;   JOINT REPLACEMENT     right hip  01-2011   LEFT HEART CATH AND CORONARY ANGIOGRAPHY N/A 03/16/2019    Procedure: LEFT HEART CATH AND CORONARY ANGIOGRAPHY;  Surgeon: Tonny Bollman, MD;  Location: [facility] INVASIVE CV LAB;  Service: Cardiovascular;  Laterality: N/A;   LIMB SPARING RESECTION HIP W/ SADDLE JOINT REPLACEMENT Right    MINIMALLY INVASIVE MAZE PROCEDURE N/A 03/21/2019   Procedure: MINIMALLY INVASIVE MAZE PROCEDURE;  Surgeon: Purcell Nails, MD;  Location: [facility] OR;  Service: Open Heart Surgery;  Laterality: N/A;   MITRAL VALVE REPAIR Right 03/21/2019   Procedure: MINIMALLY INVASIVE MITRAL VALVE REPAIR (MVR) using Memo 4D ring size 36;  Surgeon: Purcell Nails, MD;  Location: [facility] OR;  Service: Open Heart Surgery;  Laterality: Right;   POLYPECTOMY  01/30/2022   Procedure: POLYPECTOMY;  Surgeon: Mansouraty, Netty Starring., MD;  Location: [facility];  Service: Gastroenterology;;   SKIN SPLIT GRAFT Left 04/10/2022   Procedure: APPLY SKIN GRAFT TO LEFT LEG WOUND;  Surgeon: Nadara Mustard, MD;  Location: [facility] OR;  Service: Orthopedics;  Laterality: Left;   TEE WITHOUT CARDIOVERSION N/A 01/03/2019  Procedure: TRANSESOPHAGEAL ECHOCARDIOGRAM (TEE);  Surgeon: Quintella Reichert, MD;  Location: [facility] ENDOSCOPY;  Service: Cardiovascular;  Laterality: N/A;   TEE WITHOUT CARDIOVERSION N/A 03/21/2019   Procedure: TRANSESOPHAGEAL ECHOCARDIOGRAM (TEE);  Surgeon: Purcell Nails, MD;  Location: [facility] OR;  Service: Open Heart Surgery;  Laterality: N/A;   TEE WITHOUT CARDIOVERSION N/A 12/11/2022   Procedure: TRANSESOPHAGEAL ECHOCARDIOGRAM (TEE);  Surgeon: Laurey Morale, MD;  Location: [facility] ENDOSCOPY;  Service: Cardiovascular;  Laterality: N/A;   TEMPORARY PACEMAKER N/A 03/22/2019   Procedure: TEMPORARY PACEMAKER;  Surgeon: Lyn Records, MD;  Location: [facility] INVASIVE CV LAB;  Service: Cardiovascular;  Laterality: N/A;   TOTAL KNEE ARTHROPLASTY Right 01/19/2014   Procedure: RIGHT TOTAL KNEE ARTHROPLASTY, Steroid injection left knee;  Surgeon: Kathryne Hitch, MD;  Location: [facility] ORS;  Service: Orthopedics;  Laterality: Right;    TOTAL KNEE ARTHROPLASTY Left 10/23/2014   Procedure: LEFT TOTAL KNEE ARTHROPLASTY;  Surgeon: Kathryne Hitch, MD;  Location: [facility] ORS;  Service: Orthopedics;  Laterality: Left;   UMBILICAL HERNIA REPAIR N/A 07/10/2016   Procedure: UMBILICAL HERNIA REPAIR;  Surgeon: Abigail Miyamoto, MD;  Location: [facility];  Service: General;  Laterality: N/A;    Current Medications: Current Meds  Medication Sig   warfarin (COUMADIN) 5 MG tablet TAKE 1 TABLET EVERY DAY EXCEPT TAKE 1 AND 1/2 TABLETS ON WEDNESDAY AS INSTRUCTED     Allergies:   Fentanyl   Social and Family History: Reviewed in Epic  ROS:   Please see the history of present illness.    All other systems reviewed and are negative.  EKGs/Labs/Other Studies Reviewed Today:    Echocardiogram:  TEE 2/24 1. Left ventricular ejection fraction, by estimation, is 40 to 45% . The left ventricle has mildly decreased function. The left ventricle demonstrates global hypokinesis. There is mild concentric left ventricular hypertrophy. 2. Right ventricular systolic function is mildly reduced. The right ventricular size is normal. 3. The atrial appendage has been surgically clipped, no thrombus in residual appendage or in LA. Left atrial size was mildly dilated. 4. The mitral valve has been repaired. Mild mitral valve regurgitation. No evidence of mitral stenosis. The mean mitral valve gradient is 2. 0 mmHg. There is a prosthetic annuloplasty ring present in the mitral position. Procedure Date: 5/ 19/ 20. 5. The aortic valve is tricuspid. Aortic valve regurgitation is not visualized. No aortic stenosis is present. 6. No ASD or PFO by color doppler.  - Echo (2/20) pre-MV repair in setting of AF: EF 35-40%, moderate RV dysfunction, severe MR.  - Echo (7/20) post-MV repair: EF 55-60%, normal RV, s/p MV repair with mild MR.  - Echo (9/21): EF 55-60%, s/p MV repair with no MR, mean gradient 5.  - Echo (10/22): EF 45-50%, patient was in  atrial fibrillation.  - Echo (10/23): EF 25%, global hypokinesis, moderate RV dysfunction, s/p MV repair with no MR and no significant stenosis. The patient was in atrial fibrillation.    Monitors:    Stress testing:   Advanced imaging:    EKG:  Last EKG results: today -- sinus rhythm with PACs,  RBBB, LAFB 12/23 - atrial fibrillation with PVCs  Recent Labs: 11/20/2022: B Natriuretic Peptide 388.9; Platelets 187 12/11/2022: Hemoglobin 17.0 01/01/2023: ALT 33; TSH 2.335 01/11/2023: BUN 30; Creatinine, Ser 1.26; Potassium 3.9; Sodium 139     Physical Exam:    VS:  BP 130/68   Pulse 75   Ht 6\' 3"  (1.905 m)   Wt 258 lb  12.8 oz (117.4 kg)   SpO2 95%   BMI 32.35 kg/m     Wt Readings from Last 3 Encounters:  02/12/23 258 lb 12.8 oz (117.4 kg)  02/02/23 262 lb 9.6 oz (119.1 kg)  01/01/23 259 lb 6.4 oz (117.7 kg)     GEN:  Well nourished, well developed in no acute distress CARDIAC: RRR, no murmurs, rubs, gallops RESPIRATORY:  Normal work of breathing MUSCULOSKELETAL: no edema    ASSESSMENT & PLAN:    Atrial fibrillation and atypical atrial flutter S/p MAZE procedure and LAA clip S/p DCCV x2 (8/20 and 2/24) With multiple recurrences, complicated by CHF with severely reduced EF Maintaining sinus rhythm on amiodarone I think EP study, attempted induction of atypical flutter, mapping and potential ablation or warranted.  We will see if there is any anatomical substrate or gaps from his prior ablation --additional substrate for A-fib ablation, and also see if we can induce and ablate his arrhythmia.  This will be a difficult and relatively long procedure.  I explained that there is certainly risk that we would not be able to induce or identify his rhythm. Continue amiodarone for now, stop amiodarone 1 month prior to the ablation Continue warfarin  We discussed the indication, rationale, logistics, anticipated benefits, and potential risks of the ablation procedure including  but not limited to -- bleed at the groin access site, chest pain, damage to nearby organs such as the diaphragm, lungs, or esophagus, need for a drainage tube, or prolonged hospitalization. I explained that the risk for stroke, heart attack, need for open chest surgery, or even death is very low but not zero. he  expressed understanding and wishes to proceed.   Chronic systolic CHF Likely tachycardia-mediated Continue metoprolol XL 25, Entresto 49-51, Aldactone 25            Medication Adjustments/Labs and Tests Ordered: Current medicines are reviewed at length with the patient today.  Concerns regarding medicines are outlined above.  Orders Placed This Encounter  Procedures   EKG 12-Lead   No orders of the defined types were placed in this encounter.    Signed, Maurice Small, MD  02/12/2023 2:01 PM    [facility]

## 2023-02-12 NOTE — Patient Instructions (Addendum)
Medication Instructions:  Your physician recommends that you continue on your current medications as directed. Please refer to the Current Medication list given to you today.  STOP Amiodarone 1 month prior to ablation - - LAST DOSE ON 06/14/23 *If you need a refill on your cardiac medications before your next appointment, please call your pharmacy*  Testing/Procedures: Ablation Your physician has recommended that you have an ablation. Catheter ablation is a medical procedure used to treat some cardiac arrhythmias (irregular heartbeats). During catheter ablation, a long, thin, flexible tube is put into a blood vessel in your groin (upper thigh), or neck. This tube is called an ablation catheter. It is then guided to your heart through the blood vessel. Radio frequency waves destroy small areas of heart tissue where abnormal heartbeats may cause an arrhythmia to start. Please see the instruction sheet given to you today. You are scheduled for  Atrial Flutter Ablation  on Thursday, September 12 with Dr. Halford Chessman.Please arrive at the Main Entrance A at [facility]: [address] [place], [place] [address] at 5:30 AM    Follow-Up: At [facility], you and your health needs are our priority.  As part of our continuing mission to provide you with exceptional heart care, we have created designated Provider Care Teams.  These Care Teams include your primary Cardiologist (physician) and Advanced Practice Providers (APPs -  Physician Assistants and Nurse Practitioners) who all work together to provide you with the care you need, when you need it.  We recommend signing up for the patient portal called "MyChart".  Sign up information is provided on this After Visit Summary.  MyChart is used to connect with patients for Virtual Visits (Telemedicine).  Patients are able to view lab/test results, encounter notes, upcoming appointments, etc.  Non-urgent messages can be sent to your provider as well.    To learn more about what you can do with MyChart, go to [email or web address].    Your next appointment:   Tuesday, 06/29/23 9:30am  Provider:   York Pellant, MD

## 2023-02-15 ENCOUNTER — Telehealth: Payer: Self-pay | Admitting: *Deleted

## 2023-02-15 NOTE — Telephone Encounter (Signed)
Called pt since he is overdue and has AFL ablation coming up 07/15/23. The primary number rings busy and unable to leave a message on that line.   Pt was last seen in the Anticoagulation Clinic on 12/11/22 and was due back on 12/18/22 and he did not show up according to appointment tab.

## 2023-02-15 NOTE — Telephone Encounter (Signed)
-----   Message from Festus Holts, RN sent at 02/12/2023  5:22 PM EDT ----- Regarding: AFL Ablation Patient is scheduled for an AFL ablation on 07/15/23 at 0730 with Dr Nelly Laurence. Labs to be completed at 06/29/23 appointment.  Can you please ensure that this patient has weekly INR checks for the 3 weeks prior. Patient does have an appointment on 06/29/23, lab work is to be completed then so we can add an INR on for that week.  Thanks, Brayton Caves, RN

## 2023-02-16 ENCOUNTER — Ambulatory Visit: Payer: Medicare HMO | Admitting: Orthopedic Surgery

## 2023-02-16 DIAGNOSIS — S81802D Unspecified open wound, left lower leg, subsequent encounter: Secondary | ICD-10-CM

## 2023-02-16 DIAGNOSIS — T79A22D Traumatic compartment syndrome of left lower extremity, subsequent encounter: Secondary | ICD-10-CM

## 2023-02-16 NOTE — Telephone Encounter (Signed)
Called pt today to schedule an Anticoagulation Appt; there was no answer so left a message to call back.

## 2023-02-18 ENCOUNTER — Other Ambulatory Visit: Payer: Self-pay

## 2023-02-18 DIAGNOSIS — I4892 Unspecified atrial flutter: Secondary | ICD-10-CM

## 2023-02-18 DIAGNOSIS — I4819 Other persistent atrial fibrillation: Secondary | ICD-10-CM

## 2023-02-21 ENCOUNTER — Encounter: Payer: Self-pay | Admitting: Orthopedic Surgery

## 2023-02-21 NOTE — Progress Notes (Signed)
Office Visit Note   Patient: [name]           Date of Birth: 1955-12-29           MRN: [id number] Visit Date: 02/16/2023              Requested by: Tracey Harries, MD [address] [place],  [place] [address] PCP: Tracey Harries, MD  Chief Complaint  Patient presents with   Left Leg - Follow-up      HPI: Patient is status post tissue grafting for compartment release left lower extremity he is using Dial soap cleansing.  Kerecis donated graft was applied on March 5.  Assessment & Plan: Visit Diagnoses:  1. Compartment syndrome of left lower extremity, subsequent encounter   2. Leg wound, left, subsequent encounter     Plan: Continue with current wound care.  Follow-Up Instructions: Return in about 2 months (around 04/18/2023).   Ortho Exam  Patient is alert, oriented, no adenopathy, well-dressed, normal affect, normal respiratory effort. Examination patient has healthy granulation tissue.  The wound and its largest dimensions measures 4 x 7 cm.  There is still some venous swelling and dermatitis but no cellulitis no drainage  Imaging: No results found.   Labs: Lab Results  Component Value Date   HGBA1C 5.4 01/29/2022   HGBA1C 5.2 03/16/2019   REPTSTATUS 01/07/2022 FINAL 01/02/2022   REPTSTATUS 01/08/2022 FINAL 01/02/2022   GRAMSTAIN  01/02/2022    FEW WBC PRESENT,BOTH PMN AND MONONUCLEAR RARE GRAM POSITIVE COCCI RARE GRAM NEGATIVE RODS Performed at [facility], [address] [place], [place] [address]    GRAMSTAIN  01/02/2022    FEW WBC PRESENT,BOTH PMN AND MONONUCLEAR NO ORGANISMS SEEN    CULT  01/02/2022    FEW STREPTOCOCCUS GROUP C Beta hemolytic streptococci are predictably susceptible to penicillin and other beta lactams. Susceptibility testing not routinely performed. ABUNDANT KLEBSIELLA OXYTOCA ABUNDANT STENOTROPHOMONAS MALTOPHILIA    CULT  01/02/2022    NO ANAEROBES ISOLATED Performed at [facility],  [address], [place], [place] [address]    LABORGA KLEBSIELLA OXYTOCA 01/02/2022   LABORGA STENOTROPHOMONAS MALTOPHILIA 01/02/2022     Lab Results  Component Value Date   ALBUMIN 4.0 01/01/2023   ALBUMIN 4.0 11/20/2022   ALBUMIN 2.5 (L) 01/30/2022    Lab Results  Component Value Date   MG 2.1 01/29/2022   MG 2.1 06/19/2019   MG 2.2 03/25/2019   No results found for: "VD25OH"  No results found for: "PREALBUMIN"    Latest Ref Rng & Units 12/11/2022   11:40 AM 11/20/2022   12:12 PM 04/10/2022    8:06 AM  CBC EXTENDED  WBC 4.0 - 10.5 K/uL  6.7  6.0   RBC 4.22 - 5.81 MIL/uL  4.80  4.02   Hemoglobin 13.0 - 17.0 g/dL 86.5  78.4  9.7   HCT 69.6 - 52.0 % 50.0  46.7  34.4   Platelets 150 - 400 K/uL  187  242      There is no height or weight on file to calculate BMI.  Orders:  No orders of the defined types were placed in this encounter.  No orders of the defined types were placed in this encounter.    Procedures: No procedures performed  Clinical Data: No additional findings.  ROS:  All other systems negative, except as noted in the HPI. Review of Systems  Objective: Vital Signs: There were no vitals taken  for this visit.  Specialty Comments:  No specialty comments available.  PMFS History: Patient Active Problem List   Diagnosis Date Noted   Left ventricular dysfunction 10/13/2022   Rectal bleeding 01/30/2022   Abnormal finding on imaging 01/30/2022   GIB (gastrointestinal bleeding) 01/29/2022   Hyponatremia 01/29/2022   Hypocalcemia 01/29/2022   Alcohol abuse 01/29/2022   AKI (acute kidney injury) 01/29/2022   Symptomatic anemia 01/29/2022   Venous stasis dermatitis of both lower extremities    Bleeding on Coumadin    Leg wound, left, subsequent encounter 12/30/2021   Compartment syndrome of left lower extremity 09/22/2021   Status post surgery 09/22/2021   Compartment syndrome of left upper extremity 09/22/2021   Hyperlipidemia 12/13/2020   Atrial  fibrillation 06/10/2020   Long term (current) use of anticoagulants 06/10/2020   Secondary hypercoagulable state 04/24/2020   AV junctional bradycardia 03/22/2019   S/P minimally invasive mitral valve repair  03/21/2019   S/P Maze operation for atrial fibrillation 03/21/2019   Dilated aortic root 01/04/2019   Dilated cardiomyopathy 01/04/2019   Atrial flutter with rapid ventricular response    Mitral valve prolapse    Hypertensive urgency 12/30/2018   Acute on chronic systolic (congestive) heart failure 12/30/2018   Acute systolic heart failure 12/30/2018   Chronic systolic (congestive) heart failure    Persistent atrial fibrillation 12/02/2018   Right hamstring muscle strain 07/20/2018   Essential hypertension 02/07/2016   Severe mitral regurgitation    Annual physical exam 11/16/2014   Arthritis of left knee 10/23/2014   Status post total left knee replacement 10/23/2014   Arthritis of knee, right 01/19/2014   Status post total knee replacement 01/19/2014   Benign paroxysmal positional vertigo 10/30/2013   Testicular hypofunction 03/24/2012   Past Medical History:  Diagnosis Date   Acute on chronic systolic (congestive) heart failure 12/30/2018   Allergy    Arthritis    Atrial flutter with rapid ventricular response    Chronic systolic (congestive) heart failure    COVID 10/2021   mild   Dilated aortic root 01/04/2019   Dilated cardiomyopathy 01/04/2019   Dysrhythmia    Heart murmur    History of blood transfusion    01/27/22   History of colon polyps    Hypertension    Insomnia    Mitral valve prolapse    Mitral valve regurgitation    Persistent atrial fibrillation 12/02/2018   S/P Maze operation for atrial fibrillation 03/21/2019   Complete bilateral atrial lesion set using cryothermy and bipolar radiofrequency ablation with clipping of LA appendage via right mini thoracotomy approach   S/P minimally invasive mitral valve repair 03/21/2019   Complex valvuloplasty  including triangular resection of posterior leaflet, artificial Gore-tex neochord placement x4 and 36 mm Sorin Memo 4D ring annuloplasty via right mini thoracotomy approach   Seizures    had one approx. 30 yrs ago,has not had any since; due to alchol and no sleep   Severe mitral regurgitation     Family History  Problem Relation Age of Onset   Hypertension Father    Colon cancer Father        dx in his late 40's   Diabetes Mother    Stomach cancer Neg Hx    Esophageal cancer Neg Hx    Pancreatic cancer Neg Hx    Rectal cancer Neg Hx     Past Surgical History:  Procedure Laterality Date   ANKLE FUSION  left   Dec. 2012   APPLICATION OF  WOUND VAC Left 09/22/2021   Procedure: APPLICATION OF WOUND VAC;  Surgeon: Kathryne Hitch, MD;  Location: [facility] OR;  Service: Orthopedics;  Laterality: Left;   APPLICATION OF WOUND VAC Left 12/30/2021   Procedure: APPLICATION OF WOUND VAC;  Surgeon: Kathryne Hitch, MD;  Location: [facility] OR;  Service: Orthopedics;  Laterality: Left;   APPLICATION OF WOUND VAC Left 04/10/2022   Procedure: APPLICATION OF WOUND VAC;  Surgeon: Nadara Mustard, MD;  Location: [facility] OR;  Service: Orthopedics;  Laterality: Left;   ARTHROSCOPY KNEE W/ DRILLING  bilateral   2012   BIOPSY  01/30/2022   Procedure: BIOPSY;  Surgeon: Meridee Score Netty Starring., MD;  Location: [facility];  Service: Gastroenterology;;   CARDIAC VALVE REPLACEMENT     CARDIOVERSION N/A 01/03/2019   Procedure: CARDIOVERSION;  Surgeon: Quintella Reichert, MD;  Location: [facility] ENDOSCOPY;  Service: Cardiovascular;  Laterality: N/A;   CARDIOVERSION N/A 06/23/2019   Procedure: CARDIOVERSION;  Surgeon: Pricilla Riffle, MD;  Location: [facility] ENDOSCOPY;  Service: Cardiovascular;  Laterality: N/A;   CARDIOVERSION N/A 12/11/2022   Procedure: CARDIOVERSION;  Surgeon: Laurey Morale, MD;  Location: [facility] ENDOSCOPY;  Service: Cardiovascular;  Laterality: N/A;   CLIPPING OF ATRIAL APPENDAGE  03/21/2019   Procedure: Clipping Of  Atrial Appendage using 45mm Atricure Pro2 Clip;  Surgeon: Purcell Nails, MD;  Location: [facility] OR;  Service: Open Heart Surgery;;   COLONOSCOPY     x2   COLONOSCOPY N/A 01/30/2022   Procedure: COLONOSCOPY;  Surgeon: Lemar Lofty., MD;  Location: [facility] ENDOSCOPY;  Service: Gastroenterology;  Laterality: N/A;   ESOPHAGOGASTRODUODENOSCOPY N/A 01/30/2022   Procedure: ESOPHAGOGASTRODUODENOSCOPY (EGD);  Surgeon: Lemar Lofty., MD;  Location: [facility];  Service: Gastroenterology;  Laterality: N/A;   FOOT ARTHRODESIS  06/23/2012   Procedure: ARTHRODESIS FOOT;  Surgeon: Toni Arthurs, MD;  Location: [facility] OR;  Service: Orthopedics;  Laterality: Left;  Left Subtalar and Talonavicular Joint Revision Arthrodesis  Aspiration of Bone Marrow from Left Hip    HAIR TRANSPLANT     HARDWARE REMOVAL  06/23/2012   Procedure: HARDWARE REMOVAL;  Surgeon: Toni Arthurs, MD;  Location: [facility] OR;  Service: Orthopedics;  Laterality: Left;  Removal of Deep Implant  X's 3   I & D EXTREMITY Left 09/23/2021   Procedure: IRRIGATION AND DEBRIDEMENT OF LEG;  Surgeon: Kathryne Hitch, MD;  Location: [facility] OR;  Service: Orthopedics;  Laterality: Left;   I & D EXTREMITY Left 09/26/2021   Procedure: REPEAT IRRIGATION AND DEBRIDEMENT LEFT LEG, POSSIBLE WOUND CLOSURE, POSSIBLE VAC CHANGE, POSSIBLE SKIN GRAFT;  Surgeon: Kathryne Hitch, MD;  Location: [facility] OR;  Service: Orthopedics;  Laterality: Left;   I & D EXTREMITY Left 12/30/2021   Procedure: IRRIGATION AND DEBRIDEMENT LEFT LOWER LEG WOUND;  Surgeon: Kathryne Hitch, MD;  Location: [facility] OR;  Service: Orthopedics;  Laterality: Left;   I & D EXTREMITY Left 01/02/2022   Procedure: LEFT LEG DEBRIDEMENT AND TISSUE GRAFT;  Surgeon: Nadara Mustard, MD;  Location: [facility] OR;  Service: Orthopedics;  Laterality: Left;   INCISION AND DRAINAGE WOUND WITH FASCIOTOMY Left 09/22/2021   Procedure: INCISION AND DRAINAGE WOUND WITH FASCIOTOMY;  Surgeon: Kathryne Hitch, MD;   Location: [facility] OR;  Service: Orthopedics;  Laterality: Left;   INSERTION OF MESH N/A 07/10/2016   Procedure: INSERTION OF MESH;  Surgeon: Abigail Miyamoto, MD;  Location: [facility];  Service: General;  Laterality: N/A;   JOINT REPLACEMENT     right hip  01-2011   LEFT HEART CATH AND CORONARY ANGIOGRAPHY N/A 03/16/2019   Procedure: LEFT HEART CATH AND CORONARY ANGIOGRAPHY;  Surgeon: Tonny Bollman, MD;  Location: [facility] INVASIVE CV LAB;  Service: Cardiovascular;  Laterality: N/A;   LIMB SPARING RESECTION HIP W/ SADDLE JOINT REPLACEMENT Right    MINIMALLY INVASIVE MAZE PROCEDURE N/A 03/21/2019   Procedure: MINIMALLY INVASIVE MAZE PROCEDURE;  Surgeon: Purcell Nails, MD;  Location: [facility] OR;  Service: Open Heart Surgery;  Laterality: N/A;   MITRAL VALVE REPAIR Right 03/21/2019   Procedure: MINIMALLY INVASIVE MITRAL VALVE REPAIR (MVR) using Memo 4D ring size 36;  Surgeon: Purcell Nails, MD;  Location: [facility] OR;  Service: Open Heart Surgery;  Laterality: Right;   POLYPECTOMY  01/30/2022   Procedure: POLYPECTOMY;  Surgeon: Mansouraty, Netty Starring., MD;  Location: [facility];  Service: Gastroenterology;;   SKIN SPLIT GRAFT Left 04/10/2022   Procedure: APPLY SKIN GRAFT TO LEFT LEG WOUND;  Surgeon: Nadara Mustard, MD;  Location: [facility] OR;  Service: Orthopedics;  Laterality: Left;   TEE WITHOUT CARDIOVERSION N/A 01/03/2019   Procedure: TRANSESOPHAGEAL ECHOCARDIOGRAM (TEE);  Surgeon: Quintella Reichert, MD;  Location: [facility] ENDOSCOPY;  Service: Cardiovascular;  Laterality: N/A;   TEE WITHOUT CARDIOVERSION N/A 03/21/2019   Procedure: TRANSESOPHAGEAL ECHOCARDIOGRAM (TEE);  Surgeon: Purcell Nails, MD;  Location: [facility] OR;  Service: Open Heart Surgery;  Laterality: N/A;   TEE WITHOUT CARDIOVERSION N/A 12/11/2022   Procedure: TRANSESOPHAGEAL ECHOCARDIOGRAM (TEE);  Surgeon: Laurey Morale, MD;  Location: [facility] ENDOSCOPY;  Service: Cardiovascular;  Laterality: N/A;   TEMPORARY PACEMAKER N/A 03/22/2019   Procedure: TEMPORARY  PACEMAKER;  Surgeon: Lyn Records, MD;  Location: [facility] INVASIVE CV LAB;  Service: Cardiovascular;  Laterality: N/A;   TOTAL KNEE ARTHROPLASTY Right 01/19/2014   Procedure: RIGHT TOTAL KNEE ARTHROPLASTY, Steroid injection left knee;  Surgeon: Kathryne Hitch, MD;  Location: [facility] ORS;  Service: Orthopedics;  Laterality: Right;   TOTAL KNEE ARTHROPLASTY Left 10/23/2014   Procedure: LEFT TOTAL KNEE ARTHROPLASTY;  Surgeon: Kathryne Hitch, MD;  Location: [facility] ORS;  Service: Orthopedics;  Laterality: Left;   UMBILICAL HERNIA REPAIR N/A 07/10/2016   Procedure: UMBILICAL HERNIA REPAIR;  Surgeon: Abigail Miyamoto, MD;  Location:  SURGERY CENTER;  Service: General;  Laterality: N/A;   Social History   Occupational History   Occupation: Teaching laboratory technician  Tobacco Use   Smoking status: Never   Smokeless tobacco: Never  Vaping Use   Vaping Use: Never used  Substance and Sexual Activity   Alcohol use: Not Currently    Alcohol/week: 21.0 standard drinks of alcohol    Types: 21 Shots of liquor per week   Drug use: No   Sexual activity: Yes

## 2023-02-22 ENCOUNTER — Other Ambulatory Visit (HOSPITAL_COMMUNITY): Payer: Self-pay

## 2023-02-23 ENCOUNTER — Ambulatory Visit (HOSPITAL_COMMUNITY)
Admission: RE | Admit: 2023-02-23 | Discharge: 2023-02-23 | Disposition: A | Discharge status: Home / Self Care | Payer: Medicare HMO | Source: Ambulatory Visit | Attending: Cardiology | Admitting: Cardiology

## 2023-02-23 VITALS — BP 144/82 | HR 72 | Wt 258.6 lb

## 2023-02-23 DIAGNOSIS — Z7901 Long term (current) use of anticoagulants: Secondary | ICD-10-CM | POA: Diagnosis not present

## 2023-02-23 DIAGNOSIS — I484 Atypical atrial flutter: Secondary | ICD-10-CM | POA: Diagnosis not present

## 2023-02-23 DIAGNOSIS — I519 Heart disease, unspecified: Secondary | ICD-10-CM

## 2023-02-23 DIAGNOSIS — I5022 Chronic systolic (congestive) heart failure: Secondary | ICD-10-CM | POA: Insufficient documentation

## 2023-02-23 DIAGNOSIS — Z79899 Other long term (current) drug therapy: Secondary | ICD-10-CM | POA: Insufficient documentation

## 2023-02-23 DIAGNOSIS — I4819 Other persistent atrial fibrillation: Secondary | ICD-10-CM | POA: Insufficient documentation

## 2023-02-23 LAB — BASIC METABOLIC PANEL
Anion gap: 9 (ref 5–15)
BUN: 32 mg/dL — ABNORMAL HIGH (ref 8–23)
CO2: 29 mmol/L (ref 22–32)
Calcium: 9 mg/dL (ref 8.9–10.3)
Chloride: 100 mmol/L (ref 98–111)
Creatinine, Ser: 1.37 mg/dL — ABNORMAL HIGH (ref 0.61–1.24)
GFR, Estimated: 56 mL/min — ABNORMAL LOW (ref >60)
Glucose, Bld: 101 mg/dL — ABNORMAL HIGH (ref 70–99)
Potassium: 4.8 mmol/L (ref 3.5–5.1)
Sodium: 138 mmol/L (ref 135–145)

## 2023-02-23 MED ORDER — SPIRONOLACTONE 25 MG PO TABS: 25.0000 mg | ORAL_TABLET | Freq: Every day | ORAL | 6 refills | Status: DC

## 2023-02-23 NOTE — Progress Notes (Signed)
Advanced Heart Failure Clinic Note  PCP: Dr. Everlene Other Cardiologist: Dr. Allyson Sabal HF Cardiologist: Dr. Shirlee Latch  HPI:  67 y.o. with history of persistent atrial fibrillation and CHF was referred by Dr. Allyson Sabal for evaluation of CHF.  Patient was noted in 2020 to be in atrial fibrillation.  Echo showed EF 35-40% with severe MR in the setting of partial flail posterior leaflet and ruptured chord.  In 03/2019, he had minimally invasive mitral valve repair with Maze and LA appendage clipping.  Pre-op cath had shown no significant coronary disease.  Post-op, he remained in atrial fibrillation and had DCCV to NSR in 06/2019. Echoes in 05/2019 and 07/2020 showed EF improved to 55-60%.  However, echo in 08/2021 showed EF down to 45-50%.  The patient was noted to be in atrial fibrillation with RVR at that time. Reviewing ECGs, it looks like he has been persistently in atrial fibrillation or atypical flutter with RVR in the 110s since at least 08/2021.  Echo in 08/2022 showed EF 25%, global hypokinesis, moderate RV dysfunction, s/p MV repair with no MR and no significant stenosis. The patient was still in atrial fibrillation.    Patient had TEE-guided DCCV in 12/2022 back to NSR. TEE showed EF 40-45%, mild LVH, global hypokinesis, normal RV, mitral valve repair with mild MR and mean gradient 2.    Recently presented to [facility] on 01/01/23 for follow up. He was in NSR without palpitations.  No signficant exertional dyspnea or chest pain.  No orthopnea/PND.  He was still working at his [employer] occupational [employer], and denied problems with usual activities. Reported only taking Entresto once a day.    He presented to ADHF pharmacy clinic on 02/02/23. Denied dizziness, lightheadedness, fatigue, chest pain, or palpitations. Denied SOB at rest or with activity. Reported weight gain and decreased urinary frequency over the past 3 months. Most recent home weight was 257 pounds. He was taking torsemide 20 mg daily. No PND/orthopnea. Trace LEE on  exam. He was strictly adhering to a low-salt diet during the week, but stated this is not always the case on the weekend. Endorsed alcohol use.  He was seen by EP on 02/12/23. Plan to induce arrhythmia and then move forward with ablation.  Today he returns to HF clinic for pharmacist medication titration. At last visit with pharmacy clinic, Sherryll Burger was increased to 49/51 mg BID. Overall feeling fine. Denies dizziness, lightheadedness, fatigue, chest pain or palpitations. Not regularly checking blood pressure at home. Reports home BP readings are inconsistent when he does check. He endorses medication adherence. Denies SOB. Able to complete all ADLs. Weight at home 250 lbs. Takes torsemide 20 mg daily. Has not required any additional doses. No LEE on exam. He wears a compression sock on the left leg. Denies PND/Orthopnea. Appetite is good. Endorses a low-salt diet:   HF Medications: Metoprolol succinate 25 mg once daily Entresto 49/51 mg BID Spironolactone 12.5 mg once daily Torsemide 20 mg once daily Potassium chloride 10 mEq BID   Has the patient been experiencing any side effects to the medications prescribed?  No  Does the patient have any problems obtaining medications due to transportation or finances?   No. Has [employer] and [employer] grant for [employer].   Understanding of regimen: good Understanding of indications: good Potential of compliance: good Patient understands to avoid NSAIDs. Patient understands to avoid decongestants.    Pertinent Lab Values: 02/23/23: Serum creatinine 1.37, BUN 32, Potassium 4.8, Sodium 138  Vital Signs: Weight: 258 lbs (  last clinic weight: 262 lbs) Blood pressure: 144/82  Heart rate: 72   Assessment/Plan: 1. Chronic systolic CHF:  Echoes in 05/2019 and 07/2020, presumably in NSR, showed EF 55-60%.  Echo in 08/2021 showed EF down to 45-50%, he was in atrial fibrillation at that time.  Echo in 08/2022, also in AF or AFL, showed EF 25%, global  hypokinesis, moderate RV dysfunction, s/p MV repair with no MR and no significant stenosis. Patient had a cath with no significant coronary disease in 03/2019.  His mitral valve repair was intact on 12/2022 TEE. Previously suspected that the fall in EF on 08/2022 echo may have been due to persistent AF/AFL with RVR (tachycardia-mediated cardiomyopathy); it appears his HR had been > 100 chronically.  Was back in NSR at last visit with MD since TEE-guided DCCV in 12/2022.  NYHA class I, euvolemic on exam. BP remains elevated. Patient does not want to add on additional medication therapy at this time. He was willing to increase spironolactone to a full tablet, however. Labs returned after visit today. Because potassium was 4.8 mEq/L today and spironolactone dose was increased, will discontinue potassium supplementation at this time.  - Continue torsemide 20 mg once daily  - Discontinue potassium chloride. Patient was called and vocalized understanding.  - Continue metoprolol succinate 25 mg once daily.  - Continue Entresto 49/51 mg BID. - Increase spironolactone to 25 mg once daily.  - Consider SGLT2i at follow up. Patient does not want to add on additional therapy at this time. - Repeat echo in 3 months while in NSR.    2. Atrial fibrillation/atypical AFL: H/o Maze and LA appendage ligation with MV repair in 2020.  Patient has had recurrent atrial arrhythmias.  Patient has been noted to have both AF and atypical AFL. HR was chronically > 100, concern for tachycardia-mediated cardiomyopathy. Was back in NSR at last visit with MD since TEE-guided DCCV in 12/2022.  - Continue amiodarone 200 mg daily. Will need regular eye exam.  - Continue warfarin.  - Previously referred to EP for AF ablation. Planned on 07/15/23   3. H/o MV repair: Mitral valve repair appeared stable on 08/2022 echo with no significant MR or MS. TEE 12/2022 with mild MR, no significant MS.  - Patient will need antibiotic prophylaxis with  dental work.   Follow up in June with Dr. Rayburn Felt, PharmD PGY-1 [facility] Pharmacy Resident  Karle Plumber, PharmD, BCPS, [facility], CPP Heart Failure Clinic Pharmacist [phone]

## 2023-02-23 NOTE — Patient Instructions (Signed)
It was a pleasure seeing you today!  MEDICATIONS: -We are changing your medications today -Increase spironolactone to 25 mg once daily  -Call if you have questions about your medications.  LABS: -We will call you if your labs need attention.  NEXT APPOINTMENT: Return to clinic in June 2024 with Dr. Shirlee Latch.  In general, to take care of your heart failure: -Limit your fluid intake to 2 Liters (half-gallon) per day.   -Limit your salt intake to ideally 2-3 grams (2000-3000 mg) per day. -Weigh yourself daily and record, and bring that "weight diary" to your next appointment.  (Weight gain of 2-3 pounds in 1 day typically means fluid weight.) -The medications for your heart are to help your heart and help you live longer.   -Please contact us before stopping any of your heart medications.  Call the clinic at [phone] with questions or to reschedule future appointments.

## 2023-02-24 ENCOUNTER — Other Ambulatory Visit: Payer: Self-pay | Admitting: Cardiovascular Disease

## 2023-02-24 DIAGNOSIS — I4819 Other persistent atrial fibrillation: Secondary | ICD-10-CM

## 2023-02-24 NOTE — Telephone Encounter (Addendum)
Warfarin  refill Afib Last INR 12/11/22 & have called pt to set an appt without success.  Last OV 01/01/23 with Dr. Shirlee Latch  Will try to call pt again.  Called pt at 1030am; there was no answer therefore left a message for him to call back.  Called pt on another phone line and he answered then placed me on hold, he came back to the phone stating that he has been at every [facility] office and he don't believe that he has not been here. Advised he was last seen 12/11/22 and was due back on 12/18/22 and that I needed to set an appointment. He set an appointment for Tuesday, 03/02/23 at 2pm. I asked if her has enough warfarin to last and he states he does. Will deny refill until pt comes to appointment.

## 2023-03-02 ENCOUNTER — Ambulatory Visit: Payer: Medicare HMO | Attending: Cardiovascular Disease | Admitting: *Deleted

## 2023-03-02 DIAGNOSIS — I4892 Unspecified atrial flutter: Secondary | ICD-10-CM | POA: Diagnosis not present

## 2023-03-02 DIAGNOSIS — I4891 Unspecified atrial fibrillation: Secondary | ICD-10-CM | POA: Diagnosis not present

## 2023-03-02 DIAGNOSIS — I4819 Other persistent atrial fibrillation: Secondary | ICD-10-CM

## 2023-03-02 DIAGNOSIS — Z7901 Long term (current) use of anticoagulants: Secondary | ICD-10-CM

## 2023-03-02 DIAGNOSIS — Z5181 Encounter for therapeutic drug level monitoring: Secondary | ICD-10-CM

## 2023-03-02 LAB — POCT INR: INR: 2.8 (ref 2.0–3.0)

## 2023-03-02 NOTE — Patient Instructions (Addendum)
Description   Continue taking warfarin 1 tablet daily except 1.5 tablets on Wednesdays and Fridays.  Recheck INR in 6 weeks.  Pt Started [facility] [phone].

## 2023-04-13 ENCOUNTER — Ambulatory Visit: Payer: Medicare HMO | Attending: Internal Medicine | Admitting: *Deleted

## 2023-04-13 DIAGNOSIS — I4819 Other persistent atrial fibrillation: Secondary | ICD-10-CM

## 2023-04-13 DIAGNOSIS — Z7901 Long term (current) use of anticoagulants: Secondary | ICD-10-CM

## 2023-04-13 DIAGNOSIS — I4892 Unspecified atrial flutter: Secondary | ICD-10-CM

## 2023-04-13 DIAGNOSIS — Z5181 Encounter for therapeutic drug level monitoring: Secondary | ICD-10-CM | POA: Diagnosis not present

## 2023-04-13 DIAGNOSIS — I4891 Unspecified atrial fibrillation: Secondary | ICD-10-CM

## 2023-04-13 LAB — POCT INR: INR: 2.6 (ref 2.0–3.0)

## 2023-04-13 NOTE — Patient Instructions (Addendum)
Description   Continue taking warfarin 1 tablet daily except 1.5 tablets on Wednesdays and Fridays.  Recheck INR in 8 weeks. ABLATION 07/15/23 Pt Started [facility]1/19[facility] [phone].  PER DR. Nelly Laurence 02/12/23 OV NOTE STOP Amiodarone 1 month prior to ablation - - LAST DOSE ON 06/14/23

## 2023-04-23 ENCOUNTER — Encounter: Payer: Self-pay | Admitting: Cardiovascular Disease

## 2023-04-23 ENCOUNTER — Ambulatory Visit: Payer: Medicare HMO | Attending: Cardiovascular Disease | Admitting: Cardiovascular Disease

## 2023-04-23 VITALS — BP 118/74 | HR 74 | Ht 75.0 in | Wt 260.5 lb

## 2023-04-23 DIAGNOSIS — I10 Essential (primary) hypertension: Secondary | ICD-10-CM

## 2023-04-23 DIAGNOSIS — I340 Nonrheumatic mitral (valve) insufficiency: Secondary | ICD-10-CM

## 2023-04-23 DIAGNOSIS — I4811 Longstanding persistent atrial fibrillation: Secondary | ICD-10-CM | POA: Diagnosis not present

## 2023-04-23 DIAGNOSIS — I5022 Chronic systolic (congestive) heart failure: Secondary | ICD-10-CM | POA: Diagnosis not present

## 2023-04-23 NOTE — Assessment & Plan Note (Signed)
EF initially was 35 to 40% with severe MR.  Post mitral valve repair his EF normalized.  Recent echo performed 08/07/2022 revealed a decline in his EF down to 25% thought related to tachycardia.  His most recent transesophageal echo performed 12/11/2022 revealed slight increase in his EF up to 40 to 45%.  He is completely asymptomatic.

## 2023-04-23 NOTE — Patient Instructions (Signed)
Medication Instructions:  Your physician recommends that you continue on your current medications as directed. Please refer to the Current Medication list given to you today.  *If you need a refill on your cardiac medications before your next appointment, please call your pharmacy*   Follow-Up: At [facility], you and your health needs are our priority.  As part of our continuing mission to provide you with exceptional heart care, we have created designated Provider Care Teams.  These Care Teams include your primary Cardiologist (physician) and Advanced Practice Providers (APPs -  Physician Assistants and Nurse Practitioners) who all work together to provide you with the care you need, when you need it.  We recommend signing up for the patient portal called "MyChart".  Sign up information is provided on this After Visit Summary.  MyChart is used to connect with patients for Virtual Visits (Telemedicine).  Patients are able to view lab/test results, encounter notes, upcoming appointments, etc.  Non-urgent messages can be sent to your provider as well.   To learn more about what you can do with MyChart, go to [email or web address].    Your next appointment:   6 month(s)  Provider:   Jonathan Berry, MD    

## 2023-04-23 NOTE — Assessment & Plan Note (Signed)
Status postsurgical maze and left atrial clipping.  He had DC cardioversion by Dr. Dietrich Pates 06/22/2019 and again by Dr. Shirlee Latch 12/11/2022.  He is on amiodarone and Coumadin.  His last EKG in April showed A-fib.  He saw Dr. Nelly Laurence who has scheduled him for A-fib ablation in September.  Hopefully after that with restoration of sinus rhythm and GDMT his EF will improve.

## 2023-04-23 NOTE — Progress Notes (Signed)
04/23/2023 [name]   09/25/1956  [id number]  Primary Physician Tracey Harries, MD Primary Cardiologist: Runell Gess MD Nicholes Calamity, [place]  HPI:  [name] is a 67 y.o.  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 He was referred by Dr. Everlene Other for cardiovascular evaluation because of hypertension and mitral regurgitation.  I last saw him in the office 10/13/2022.  His only cardiac risk factor is treated hypertension. He does not smoke. He's never had a heart attack or stroke. He is on 3 antihypertensive medications and is aware of some restriction. His last 2-D echocardiogram performed 11/25/12 revealed mildly dilated left ventricle with normal systolic function, moderate to severe MR and severe left atrial enlargement. He remains in sinus rhythm and otherwise is asymptomatic.     When I saw him in the office last he was in A. fib with RVR   He does complain of some increasing shortness of breath as well.  He appeared to be volume overloaded, and and heart failure.  I arrange for him to be admitted to the hospital where he was diuresed and underwent TEE guided DC cardioversion back to sinus rhythm.  He is on Eliquis.  He was seen by Dr. Cornelius Moras who agreed that he needed mitral valve repair.  His EF was 35 to 40% with severe MR and a flail P2 segment of the posterior leaflet with a ruptured chordae.  He is lost from 287 down to 241 pounds.  He is in sinus rhythm clinically and is improved.   He ultimately underwent cardiac catheterization by Dr. Excell Seltzer 03/16/2019 revealing normal coronary arteries.  5 days later on 03/21/2019 he underwent minimally invasive mitral valve repair along with a surgical Maze procedure and left atrial clipping.  He was discharged home on 03/28/2019 on Coumadin anticoagulation.  He was seen by Corine Shelter in the office 05/29/2019 and was found to be in atrial fibrillation.  He was on  amiodarone.  He underwent outpatient cardioversion successfully on 06/22/2019 by Dr. Dietrich Pates.  A 2D echo performed 05/26/2019 revealed improvement in his EF to normal with no evidence of mitral regurgitation.   Since I saw him in the office 6 months ago his EF at that time was approximately 25% by echo in October.  He was in A-fib at the time.  I referred him to Dr. Shirlee Latch who performed outpatient DC cardioversion 12/11/2022.  At that time his EF was 40 to 45%.  He has been referred to Dr. Nelly Laurence  for EP evaluation who has tentatively scheduled him for A-fib ablation in September.  He currently is asymptomatic.  He is on Coumadin anticoagulation and GDMT.   Current Meds  Medication Sig   ALPRAZolam (XANAX) 0.5 MG tablet Take 0.5 mg by mouth 2 (two) times daily.   amiodarone (PACERONE) 200 MG tablet Take 200 mg by mouth daily.   atorvastatin (LIPITOR) 20 MG tablet Take 20 mg by mouth every morning.   Docusate Sodium (DSS) 100 MG CAPS Take 3 capsules by mouth every morning.   HYDROcodone-acetaminophen (NORCO) 10-325 MG tablet Take 1 tablet by mouth every 8 (eight) hours.   hydrocortisone cream 1 % Apply 1 Application topically daily as needed.   meclizine (ANTIVERT) 25 MG tablet Take 50 mg by mouth daily as needed.   Melatonin 10 MG TABS Take 10 mg by mouth at bedtime.   metoprolol succinate (TOPROL XL)  25 MG 24 hr tablet Take 1 tablet (25 mg total) by mouth daily.   Misc Natural Products (NEURIVA) CAPS Take 1 capsule by mouth daily.   Multiple Vitamin (MULTIVITAMIN WITH MINERALS) TABS tablet Take 1 tablet by mouth every morning.   sacubitril-valsartan (ENTRESTO) 49-51 MG Take 1 tablet by mouth 2 (two) times daily.   silver sulfADIAZINE (SILVADENE) 1 % cream Apply 1 Application topically daily. Apply to affected area daily plus dry dressing   spironolactone (ALDACTONE) 25 MG tablet Take 1 tablet (25 mg total) by mouth daily.   torsemide (DEMADEX) 20 MG tablet TAKE 1 TABLET EVERY DAY   warfarin  (COUMADIN) 5 MG tablet TAKE 1 TABLET EVERY DAY EXCEPT TAKE 1 AND 1/2 TABLETS ON WEDNESDAY AS INSTRUCTED     Allergies  Allergen Reactions   Fentanyl Itching    Social History   Socioeconomic History   Marital status: Married    Spouse name: Not on file   Number of children: 1   Years of education: Not on file   Highest education level: Not on file  Occupational History   Occupation: Teaching laboratory technician  Tobacco Use   Smoking status: Never   Smokeless tobacco: Never  Vaping Use   Vaping Use: Never used  Substance and Sexual Activity   Alcohol use: Not Currently    Alcohol/week: 21.0 standard drinks of alcohol    Types: 21 Shots of liquor per week   Drug use: No   Sexual activity: Yes  Other Topics Concern   Not on file  Social History Narrative   Not on file   Social Determinants of Health   Financial Resource Strain: Not on file  Food Insecurity: Not on file  Transportation Needs: Not on file  Physical Activity: Not on file  Stress: Not on file  Social Connections: Not on file  Intimate Partner Violence: Not on file     Review of Systems: General: negative for chills, fever, night sweats or weight changes.  Cardiovascular: negative for chest pain, dyspnea on exertion, edema, orthopnea, palpitations, paroxysmal nocturnal dyspnea or shortness of breath Dermatological: negative for rash Respiratory: negative for cough or wheezing Urologic: negative for hematuria Abdominal: negative for nausea, vomiting, diarrhea, bright red blood per rectum, melena, or hematemesis Neurologic: negative for visual changes, syncope, or dizziness All other systems reviewed and are otherwise negative except as noted above.    Blood pressure 118/74, pulse 74, height 6\' 3"  (1.905 m), weight 260 lb 8 oz (118.2 kg), SpO2 94 %.  General appearance: alert and no distress Neck: no adenopathy, no carotid bruit, no JVD, supple, symmetrical, trachea midline, and thyroid not enlarged, symmetric, no  tenderness/mass/nodules Lungs: clear to auscultation bilaterally Heart: irregularly irregular rhythm Extremities: extremities normal, atraumatic, no cyanosis or edema Pulses: 2+ and symmetric Skin: Skin color, texture, turgor normal. No rashes or lesions Neurologic: Grossly normal  EKG not performed today      ASSESSMENT AND PLAN:   Essential hypertension History of essential hypertension blood pressure measured today 118/74.  He is on metoprolol, Entresto and spironolactone.  Severe mitral regurgitation History of severe mitral regurgitation status post cardiac catheterization by Dr. Excell Seltzer 03/16/2019 revealing normal coronary arteries.  5 days later on 03/21/2019 he underwent minimally invasive mitral valve repair along with surgical maze and left atrial clipping by Dr. Cornelius Moras .  Echo performed 05/26/2019 revealed improvement in his EF without evidence of much regurgitation  Chronic systolic (congestive) heart failure (HCC) EF initially was 35 to 40% with  severe MR.  Post mitral valve repair his EF normalized.  Recent echo performed 08/07/2022 revealed a decline in his EF down to 25% thought related to tachycardia.  His most recent transesophageal echo performed 12/11/2022 revealed slight increase in his EF up to 40 to 45%.  He is completely asymptomatic.  Atrial fibrillation (HCC) Status postsurgical maze and left atrial clipping.  He had DC cardioversion by Dr. Dietrich Pates 06/22/2019 and again by Dr. Shirlee Latch 12/11/2022.  He is on amiodarone and Coumadin.  His last EKG in April showed A-fib.  He saw Dr. Nelly Laurence who has scheduled him for A-fib ablation in September.  Hopefully after that with restoration of sinus rhythm and GDMT his EF will improve.     Runell Gess MD FACP,FACC,FAHA, [facility] 04/23/2023 2:18 PM

## 2023-04-23 NOTE — Assessment & Plan Note (Signed)
History of essential hypertension blood pressure measured today 118/74.  He is on metoprolol, Entresto and spironolactone.

## 2023-04-23 NOTE — Assessment & Plan Note (Signed)
History of severe mitral regurgitation status post cardiac catheterization by Dr. Excell Seltzer 03/16/2019 revealing normal coronary arteries.  5 days later on 03/21/2019 he underwent minimally invasive mitral valve repair along with surgical maze and left atrial clipping by Dr. Cornelius Moras .  Echo performed 05/26/2019 revealed improvement in his EF without evidence of much regurgitation

## 2023-05-07 ENCOUNTER — Encounter: Payer: Self-pay | Admitting: Cardiovascular Disease

## 2023-05-07 ENCOUNTER — Telehealth: Payer: Self-pay | Admitting: Pharmacist

## 2023-05-07 DIAGNOSIS — I4819 Other persistent atrial fibrillation: Secondary | ICD-10-CM

## 2023-05-07 MED ORDER — WARFARIN SODIUM 5 MG PO TABS
ORAL_TABLET | ORAL | 0 refills | Status: DC
Start: 2023-05-07 — End: 2023-05-11

## 2023-05-07 NOTE — Telephone Encounter (Signed)
Warfarin refill sent to pharmacy. 

## 2023-05-11 ENCOUNTER — Other Ambulatory Visit: Payer: Self-pay

## 2023-05-11 DIAGNOSIS — I4819 Other persistent atrial fibrillation: Secondary | ICD-10-CM

## 2023-05-11 MED ORDER — WARFARIN SODIUM 5 MG PO TABS
ORAL_TABLET | ORAL | 0 refills | Status: DC
Start: 2023-05-11 — End: 2023-10-25

## 2023-05-25 ENCOUNTER — Ambulatory Visit: Payer: Medicare HMO | Admitting: Orthopedic Surgery

## 2023-05-25 DIAGNOSIS — S81802D Unspecified open wound, left lower leg, subsequent encounter: Secondary | ICD-10-CM | POA: Diagnosis not present

## 2023-05-25 DIAGNOSIS — T79A22D Traumatic compartment syndrome of left lower extremity, subsequent encounter: Secondary | ICD-10-CM | POA: Diagnosis not present

## 2023-05-27 ENCOUNTER — Encounter: Payer: Self-pay | Admitting: Orthopedic Surgery

## 2023-05-27 NOTE — Progress Notes (Signed)
Office Visit Note   Patient: [name]           Date of Birth: [birth year]           MRN: [id number] Visit Date: 05/25/2023              Requested by: Tracey Harries, MD [address] [place],  [place] [address] PCP: Tracey Harries, MD  Chief Complaint  Patient presents with   Left Leg - Follow-up      HPI: Patient is a 67 year old gentleman presents in follow-up for ulceration left lower extremity.  Patient has undergone serial application of Kerecis micro graft.  Patient states he had cardioversion a few months ago.  He states he is scheduled for an ablation in September.  Assessment & Plan: Visit Diagnoses:  1. Compartment syndrome of left lower extremity, subsequent encounter   2. Leg wound, left, subsequent encounter     Plan: Donated Kerecis micro graft was applied.  Continue wound care.  Follow-Up Instructions: Return in about 2 months (around 07/26/2023).   Ortho Exam  Patient is alert, oriented, no adenopathy, well-dressed, normal affect, normal respiratory effort. Examination the wound continues to heal slowly.  The wound is a shape above the and the length of the sides is 5 cm and the width is 1 cm.  After debridement there is healthy granulation tissue.  Donated Kerecis micro graft was applied this was covered with a sterile dressing he will leave the Adaptic in place and change the 4 x 4 gauze for a week and then will resume with his compression sock.  Imaging: No results found.   Labs: Lab Results  Component Value Date   HGBA1C 5.4 01/29/2022   HGBA1C 5.2 03/16/2019   REPTSTATUS 01/07/2022 FINAL 01/02/2022   REPTSTATUS 01/08/2022 FINAL 01/02/2022   GRAMSTAIN  01/02/2022    FEW WBC PRESENT,BOTH PMN AND MONONUCLEAR RARE GRAM POSITIVE COCCI RARE GRAM NEGATIVE RODS Performed at [facility], [address] [place], [place] [address]    GRAMSTAIN  01/02/2022    FEW WBC PRESENT,BOTH PMN AND MONONUCLEAR NO ORGANISMS SEEN     CULT  01/02/2022    FEW STREPTOCOCCUS GROUP C Beta hemolytic streptococci are predictably susceptible to penicillin and other beta lactams. Susceptibility testing not routinely performed. ABUNDANT KLEBSIELLA OXYTOCA ABUNDANT STENOTROPHOMONAS MALTOPHILIA    CULT  01/02/2022    NO ANAEROBES ISOLATED Performed at [facility], [address], [place], [place] [address]    LABORGA KLEBSIELLA OXYTOCA 01/02/2022   LABORGA STENOTROPHOMONAS MALTOPHILIA 01/02/2022     Lab Results  Component Value Date   ALBUMIN 4.0 01/01/2023   ALBUMIN 4.0 11/20/2022   ALBUMIN 2.5 (L) 01/30/2022    Lab Results  Component Value Date   MG 2.1 01/29/2022   MG 2.1 06/19/2019   MG 2.2 03/25/2019   No results found for: "VD25OH"  No results found for: "PREALBUMIN"    Latest Ref Rng & Units 12/11/2022   11:40 AM 11/20/2022   12:12 PM 04/10/2022    8:06 AM  CBC EXTENDED  WBC 4.0 - 10.5 K/uL  6.7  6.0   RBC 4.22 - 5.81 MIL/uL  4.80  4.02   Hemoglobin 13.0 - 17.0 g/dL 86.5  78.4  9.7   HCT 69.6 - 52.0 % 50.0  46.7  34.4   Platelets 150 - 400 K/uL  187  242      There is no height or weight on file  to calculate BMI.  Orders:  No orders of the defined types were placed in this encounter.  No orders of the defined types were placed in this encounter.    Procedures: No procedures performed  Clinical Data: No additional findings.  ROS:  All other systems negative, except as noted in the HPI. Review of Systems  Objective: Vital Signs: There were no vitals taken for this visit.  Specialty Comments:  No specialty comments available.  PMFS History: Patient Active Problem List   Diagnosis Date Noted   Left ventricular dysfunction 10/13/2022   Rectal bleeding 01/30/2022   Abnormal finding on imaging 01/30/2022   GIB (gastrointestinal bleeding) 01/29/2022   Hyponatremia 01/29/2022   Hypocalcemia 01/29/2022   Alcohol abuse 01/29/2022   AKI (acute kidney injury) (HCC) 01/29/2022    Symptomatic anemia 01/29/2022   Venous stasis dermatitis of both lower extremities    Bleeding on Coumadin    Leg wound, left, subsequent encounter 12/30/2021   Compartment syndrome of left lower extremity (HCC) 09/22/2021   Status post surgery 09/22/2021   Compartment syndrome of left upper extremity (HCC) 09/22/2021   Hyperlipidemia 12/13/2020   Atrial fibrillation (HCC) 06/10/2020   Long term (current) use of anticoagulants 06/10/2020   Secondary hypercoagulable state (HCC) 04/24/2020   AV junctional bradycardia 03/22/2019   S/P minimally invasive mitral valve repair  03/21/2019   S/P Maze operation for atrial fibrillation 03/21/2019   Dilated aortic root (HCC) 01/04/2019   Dilated cardiomyopathy (HCC) 01/04/2019   Atrial flutter with rapid ventricular response (HCC)    Mitral valve prolapse    Hypertensive urgency 12/30/2018   Acute on chronic systolic (congestive) heart failure (HCC) 12/30/2018   Acute systolic heart failure (HCC) 12/30/2018   Chronic systolic (congestive) heart failure (HCC)    Persistent atrial fibrillation (HCC) 12/02/2018   Right hamstring muscle strain 07/20/2018   Essential hypertension 02/07/2016   Severe mitral regurgitation    Annual physical exam 11/16/2014   Arthritis of left knee 10/23/2014   Status post total left knee replacement 10/23/2014   Arthritis of knee, right 01/19/2014   Status post total knee replacement 01/19/2014   Benign paroxysmal positional vertigo 10/30/2013   Testicular hypofunction 03/24/2012   Past Medical History:  Diagnosis Date   Acute on chronic systolic (congestive) heart failure (HCC) 12/30/2018   Allergy    Arthritis    Atrial flutter with rapid ventricular response (HCC)    Chronic systolic (congestive) heart failure (HCC)    COVID 10/2021   mild   Dilated aortic root (HCC) 01/04/2019   Dilated cardiomyopathy (HCC) 01/04/2019   Dysrhythmia    Heart murmur    History of blood transfusion    01/27/22    History of colon polyps    Hypertension    Insomnia    Mitral valve prolapse    Mitral valve regurgitation    Persistent atrial fibrillation (HCC) 12/02/2018   S/P Maze operation for atrial fibrillation 03/21/2019   Complete bilateral atrial lesion set using cryothermy and bipolar radiofrequency ablation with clipping of LA appendage via right mini thoracotomy approach   S/P minimally invasive mitral valve repair 03/21/2019   Complex valvuloplasty including triangular resection of posterior leaflet, artificial Gore-tex neochord placement x4 and 36 mm Sorin Memo 4D ring annuloplasty via right mini thoracotomy approach   Seizures (HCC)    had one approx. 30 yrs ago,has not had any since; due to alchol and no sleep   Severe mitral regurgitation     Family  History  Problem Relation Age of Onset   Hypertension Father    Colon cancer Father        dx in his late 13's   Diabetes Mother    Stomach cancer Neg Hx    Esophageal cancer Neg Hx    Pancreatic cancer Neg Hx    Rectal cancer Neg Hx     Past Surgical History:  Procedure Laterality Date   ANKLE FUSION  left   Dec. 2012   APPLICATION OF WOUND VAC Left 09/22/2021   Procedure: APPLICATION OF WOUND VAC;  Surgeon: Kathryne Hitch, MD;  Location: [facility] OR;  Service: Orthopedics;  Laterality: Left;   APPLICATION OF WOUND VAC Left 12/30/2021   Procedure: APPLICATION OF WOUND VAC;  Surgeon: Kathryne Hitch, MD;  Location: [facility] OR;  Service: Orthopedics;  Laterality: Left;   APPLICATION OF WOUND VAC Left 04/10/2022   Procedure: APPLICATION OF WOUND VAC;  Surgeon: Nadara Mustard, MD;  Location: [facility] OR;  Service: Orthopedics;  Laterality: Left;   ARTHROSCOPY KNEE W/ DRILLING  bilateral   2012   BIOPSY  01/30/2022   Procedure: BIOPSY;  Surgeon: Meridee Score Netty Starring., MD;  Location: [facility];  Service: Gastroenterology;;   CARDIAC VALVE REPLACEMENT     CARDIOVERSION N/A 01/03/2019   Procedure: CARDIOVERSION;  Surgeon: Quintella Reichert, MD;  Location: [facility] ENDOSCOPY;  Service: Cardiovascular;  Laterality: N/A;   CARDIOVERSION N/A 06/23/2019   Procedure: CARDIOVERSION;  Surgeon: Pricilla Riffle, MD;  Location: [facility] ENDOSCOPY;  Service: Cardiovascular;  Laterality: N/A;   CARDIOVERSION N/A 12/11/2022   Procedure: CARDIOVERSION;  Surgeon: Laurey Morale, MD;  Location: [facility] ENDOSCOPY;  Service: Cardiovascular;  Laterality: N/A;   CLIPPING OF ATRIAL APPENDAGE  03/21/2019   Procedure: Clipping Of Atrial Appendage using 45mm Atricure Pro2 Clip;  Surgeon: Purcell Nails, MD;  Location: [facility] OR;  Service: Open Heart Surgery;;   COLONOSCOPY     x2   COLONOSCOPY N/A 01/30/2022   Procedure: COLONOSCOPY;  Surgeon: Lemar Lofty., MD;  Location: [facility] ENDOSCOPY;  Service: Gastroenterology;  Laterality: N/A;   ESOPHAGOGASTRODUODENOSCOPY N/A 01/30/2022   Procedure: ESOPHAGOGASTRODUODENOSCOPY (EGD);  Surgeon: Lemar Lofty., MD;  Location: [facility];  Service: Gastroenterology;  Laterality: N/A;   FOOT ARTHRODESIS  06/23/2012   Procedure: ARTHRODESIS FOOT;  Surgeon: Toni Arthurs, MD;  Location: [facility] OR;  Service: Orthopedics;  Laterality: Left;  Left Subtalar and Talonavicular Joint Revision Arthrodesis  Aspiration of Bone Marrow from Left Hip    HAIR TRANSPLANT     HARDWARE REMOVAL  06/23/2012   Procedure: HARDWARE REMOVAL;  Surgeon: Toni Arthurs, MD;  Location: [facility] OR;  Service: Orthopedics;  Laterality: Left;  Removal of Deep Implant  X's 3   I & D EXTREMITY Left 09/23/2021   Procedure: IRRIGATION AND DEBRIDEMENT OF LEG;  Surgeon: Kathryne Hitch, MD;  Location: [facility] OR;  Service: Orthopedics;  Laterality: Left;   I & D EXTREMITY Left 09/26/2021   Procedure: REPEAT IRRIGATION AND DEBRIDEMENT LEFT LEG, POSSIBLE WOUND CLOSURE, POSSIBLE VAC CHANGE, POSSIBLE SKIN GRAFT;  Surgeon: Kathryne Hitch, MD;  Location: [facility] OR;  Service: Orthopedics;  Laterality: Left;   I & D EXTREMITY Left 12/30/2021   Procedure: IRRIGATION AND  DEBRIDEMENT LEFT LOWER LEG WOUND;  Surgeon: Kathryne Hitch, MD;  Location: [facility] OR;  Service: Orthopedics;  Laterality: Left;   I & D EXTREMITY Left 01/02/2022   Procedure: LEFT LEG DEBRIDEMENT AND TISSUE GRAFT;  Surgeon: Nadara Mustard, MD;  Location: [facility] OR;  Service: Orthopedics;  Laterality: Left;   INCISION AND DRAINAGE WOUND WITH FASCIOTOMY Left 09/22/2021   Procedure: INCISION AND DRAINAGE WOUND WITH FASCIOTOMY;  Surgeon: Kathryne Hitch, MD;  Location: [facility] OR;  Service: Orthopedics;  Laterality: Left;   INSERTION OF MESH N/A 07/10/2016   Procedure: INSERTION OF MESH;  Surgeon: Abigail Miyamoto, MD;  Location: [facility];  Service: General;  Laterality: N/A;   JOINT REPLACEMENT     right hip  01-2011   LEFT HEART CATH AND CORONARY ANGIOGRAPHY N/A 03/16/2019   Procedure: LEFT HEART CATH AND CORONARY ANGIOGRAPHY;  Surgeon: Tonny Bollman, MD;  Location: [facility] INVASIVE CV LAB;  Service: Cardiovascular;  Laterality: N/A;   LIMB SPARING RESECTION HIP W/ SADDLE JOINT REPLACEMENT Right    MINIMALLY INVASIVE MAZE PROCEDURE N/A 03/21/2019   Procedure: MINIMALLY INVASIVE MAZE PROCEDURE;  Surgeon: Purcell Nails, MD;  Location: [facility] OR;  Service: Open Heart Surgery;  Laterality: N/A;   MITRAL VALVE REPAIR Right 03/21/2019   Procedure: MINIMALLY INVASIVE MITRAL VALVE REPAIR (MVR) using Memo 4D ring size 36;  Surgeon: Purcell Nails, MD;  Location: [facility] OR;  Service: Open Heart Surgery;  Laterality: Right;   POLYPECTOMY  01/30/2022   Procedure: POLYPECTOMY;  Surgeon: Mansouraty, Netty Starring., MD;  Location: [facility];  Service: Gastroenterology;;   SKIN SPLIT GRAFT Left 04/10/2022   Procedure: APPLY SKIN GRAFT TO LEFT LEG WOUND;  Surgeon: Nadara Mustard, MD;  Location: [facility] OR;  Service: Orthopedics;  Laterality: Left;   TEE WITHOUT CARDIOVERSION N/A 01/03/2019   Procedure: TRANSESOPHAGEAL ECHOCARDIOGRAM (TEE);  Surgeon: Quintella Reichert, MD;  Location: [facility] ENDOSCOPY;  Service:  Cardiovascular;  Laterality: N/A;   TEE WITHOUT CARDIOVERSION N/A 03/21/2019   Procedure: TRANSESOPHAGEAL ECHOCARDIOGRAM (TEE);  Surgeon: Purcell Nails, MD;  Location: [facility] OR;  Service: Open Heart Surgery;  Laterality: N/A;   TEE WITHOUT CARDIOVERSION N/A 12/11/2022   Procedure: TRANSESOPHAGEAL ECHOCARDIOGRAM (TEE);  Surgeon: Laurey Morale, MD;  Location: [facility] ENDOSCOPY;  Service: Cardiovascular;  Laterality: N/A;   TEMPORARY PACEMAKER N/A 03/22/2019   Procedure: TEMPORARY PACEMAKER;  Surgeon: Lyn Records, MD;  Location: [facility] INVASIVE CV LAB;  Service: Cardiovascular;  Laterality: N/A;   TOTAL KNEE ARTHROPLASTY Right 01/19/2014   Procedure: RIGHT TOTAL KNEE ARTHROPLASTY, Steroid injection left knee;  Surgeon: Kathryne Hitch, MD;  Location: [facility] ORS;  Service: Orthopedics;  Laterality: Right;   TOTAL KNEE ARTHROPLASTY Left 10/23/2014   Procedure: LEFT TOTAL KNEE ARTHROPLASTY;  Surgeon: Kathryne Hitch, MD;  Location: [facility] ORS;  Service: Orthopedics;  Laterality: Left;   UMBILICAL HERNIA REPAIR N/A 07/10/2016   Procedure: UMBILICAL HERNIA REPAIR;  Surgeon: Abigail Miyamoto, MD;  Location: [facility];  Service: General;  Laterality: N/A;   Social History   Occupational History   Occupation: Teaching laboratory technician  Tobacco Use   Smoking status: Never   Smokeless tobacco: Never  Vaping Use   Vaping status: Never Used  Substance and Sexual Activity   Alcohol use: Not Currently    Alcohol/week: 21.0 standard drinks of alcohol    Types: 21 Shots of liquor per week   Drug use: No   Sexual activity: Yes

## 2023-06-08 ENCOUNTER — Ambulatory Visit: Payer: Medicare HMO

## 2023-06-08 DIAGNOSIS — Z5181 Encounter for therapeutic drug level monitoring: Secondary | ICD-10-CM | POA: Diagnosis not present

## 2023-06-08 DIAGNOSIS — Z7901 Long term (current) use of anticoagulants: Secondary | ICD-10-CM

## 2023-06-08 DIAGNOSIS — I4819 Other persistent atrial fibrillation: Secondary | ICD-10-CM

## 2023-06-08 DIAGNOSIS — I4892 Unspecified atrial flutter: Secondary | ICD-10-CM | POA: Diagnosis not present

## 2023-06-08 LAB — POCT INR: INR: 2.6 (ref 2.0–3.0)

## 2023-06-08 NOTE — Patient Instructions (Signed)
Continue taking warfarin 1 tablet daily except 1.5 tablets on Wednesdays and Fridays.  Recheck INR in 1 week. ABLATION 07/15/23-NEEDS WEEKLY CHECKS Pt Started [facility] (1/19) [facility] [phone].  PER DR. Nelly Laurence 02/12/23 OV NOTE STOP Amiodarone 1 month prior to ablation - - LAST DOSE ON 06/14/23

## 2023-06-15 ENCOUNTER — Ambulatory Visit: Payer: Medicare HMO

## 2023-06-15 DIAGNOSIS — I4819 Other persistent atrial fibrillation: Secondary | ICD-10-CM | POA: Diagnosis not present

## 2023-06-15 DIAGNOSIS — Z5181 Encounter for therapeutic drug level monitoring: Secondary | ICD-10-CM | POA: Diagnosis not present

## 2023-06-15 DIAGNOSIS — Z7901 Long term (current) use of anticoagulants: Secondary | ICD-10-CM

## 2023-06-15 DIAGNOSIS — I4892 Unspecified atrial flutter: Secondary | ICD-10-CM | POA: Diagnosis not present

## 2023-06-15 LAB — POCT INR: INR: 3.7 — AB (ref 2.0–3.0)

## 2023-06-15 NOTE — Patient Instructions (Signed)
HOLD TONIGHT THEN Continue taking warfarin 1 tablet daily except 1.5 tablets on Wednesdays and Fridays.  Recheck INR in 1 week. ABLATION 07/15/23-NEEDS WEEKLY CHECKS [facility] [phone].

## 2023-06-20 ENCOUNTER — Other Ambulatory Visit: Payer: Self-pay | Admitting: Cardiovascular Disease

## 2023-06-22 ENCOUNTER — Ambulatory Visit: Payer: Medicare HMO | Attending: Cardiology

## 2023-06-22 ENCOUNTER — Ambulatory Visit: Payer: Medicare HMO | Admitting: Orthopedic Surgery

## 2023-06-22 DIAGNOSIS — Z5181 Encounter for therapeutic drug level monitoring: Secondary | ICD-10-CM

## 2023-06-22 DIAGNOSIS — S81802D Unspecified open wound, left lower leg, subsequent encounter: Secondary | ICD-10-CM | POA: Diagnosis not present

## 2023-06-22 DIAGNOSIS — I4819 Other persistent atrial fibrillation: Secondary | ICD-10-CM | POA: Diagnosis not present

## 2023-06-22 DIAGNOSIS — Z7901 Long term (current) use of anticoagulants: Secondary | ICD-10-CM | POA: Diagnosis not present

## 2023-06-22 DIAGNOSIS — I4892 Unspecified atrial flutter: Secondary | ICD-10-CM

## 2023-06-22 LAB — POCT INR: INR: 2.6 (ref 2.0–3.0)

## 2023-06-22 NOTE — Patient Instructions (Signed)
Continue taking warfarin 1 tablet daily except 1.5 tablets on Wednesdays and Fridays.  Recheck INR in 1 week. ABLATION 07/15/23-NEEDS WEEKLY CHECKS [facility] [phone].

## 2023-06-29 ENCOUNTER — Ambulatory Visit: Payer: Medicare HMO

## 2023-06-29 ENCOUNTER — Encounter: Payer: Self-pay | Admitting: Cardiovascular Disease

## 2023-06-29 ENCOUNTER — Ambulatory Visit: Payer: Medicare HMO | Attending: Cardiovascular Disease | Admitting: Cardiovascular Disease

## 2023-06-29 VITALS — BP 120/84 | HR 67 | Ht 75.0 in | Wt 261.8 lb

## 2023-06-29 DIAGNOSIS — I4892 Unspecified atrial flutter: Secondary | ICD-10-CM | POA: Diagnosis not present

## 2023-06-29 DIAGNOSIS — I4819 Other persistent atrial fibrillation: Secondary | ICD-10-CM

## 2023-06-29 NOTE — Patient Instructions (Signed)
Medication Instructions:  Your physician recommends that you continue on your current medications as directed. Please refer to the Current Medication list given to you today. *If you need a refill on your cardiac medications before your next appointment, please call your pharmacy*   Testing/Procedures: Cardiac Ablation Your physician has recommended that you have an ablation. Catheter ablation is a medical procedure used to treat some cardiac arrhythmias (irregular heartbeats). During catheter ablation, a long, thin, flexible tube is put into a blood vessel in your groin (upper thigh), or neck. This tube is called an ablation catheter. It is then guided to your heart through the blood vessel. Radio frequency waves destroy small areas of heart tissue where abnormal heartbeats may cause an arrhythmia to start. Please see the instruction sheet given to you today.   Follow-Up: At [facility], you and your health needs are our priority.  As part of our continuing mission to provide you with exceptional heart care, we have created designated Provider Care Teams.  These Care Teams include your primary Cardiologist (physician) and Advanced Practice Providers (APPs -  Physician Assistants and Nurse Practitioners) who all work together to provide you with the care you need, when you need it.  We recommend signing up for the patient portal called "MyChart".  Sign up information is provided on this After Visit Summary.  MyChart is used to connect with patients for Virtual Visits (Telemedicine).  Patients are able to view lab/test results, encounter notes, upcoming appointments, etc.  Non-urgent messages can be sent to your provider as well.   To learn more about what you can do with MyChart, go to [email or web address].    Your next appointment:   We will schedule follow up after your ablation  Provider:   York Pellant, MD

## 2023-06-29 NOTE — Progress Notes (Signed)
Electrophysiology Office Note:    Date:  06/29/2023   ID:  YITZCHAK ALDAZ, DOB 06-23-1956, MRN [id number]  PCP:  Tracey Harries, MD   [facility] Providers Cardiologist:  Nanetta Batty, MD Cardiology APP:  Ronney Asters, NP     Referring MD: Tracey Harries, MD   History of Present Illness:    [name] is a 67 y.o. male with a hx listed below, significant for CHFrEF (35-40%), severe MR s/p minimally invasive MV repair with MAZE and LA clip in 2020 referred for arrhythmia management.     He had very severe MR with partially flail posterior leaflet and required repair by invasive mitral valve surgery with maze and left atrial appendage clipping in 2020.  He remained in atrial fibrillation, however and was cardioverted in August 2020.  He had recurrence of atrial fibrillation with RVR and a decrease in EF to 45% in October 2022. In October 2023, EF was further decreased to 20-25%, and he was still noted to be in atrial fibrillation with RVR.       He presents today for follow-up prior to ablation of atrial fibrillation and flutter scheduled for September 12.  He reports that he has not had any recent changes in his condition.  He has felt reasonably well with amiodarone.     EKGs/Labs/Other Studies Reviewed Today:    Echocardiogram:  TEE 2/24 1. Left ventricular ejection fraction, by estimation, is 40 to 45% . The left ventricle has mildly decreased function. The left ventricle demonstrates global hypokinesis. There is mild concentric left ventricular hypertrophy. 2. Right ventricular systolic function is mildly reduced. The right ventricular size is normal. 3. The atrial appendage has been surgically clipped, no thrombus in residual appendage or in LA. Left atrial size was mildly dilated. 4. The mitral valve has been repaired. Mild mitral valve regurgitation. No evidence of mitral stenosis. The mean mitral valve gradient is 2. 0 mmHg. There is a prosthetic annuloplasty  ring present in the mitral position. Procedure Date: 5/ 19/ 20. 5. The aortic valve is tricuspid. Aortic valve regurgitation is not visualized. No aortic stenosis is present. 6. No ASD or PFO by color doppler.  - Echo (2/20) pre-MV repair in setting of AF: EF 35-40%, moderate RV dysfunction, severe MR.  - Echo (7/20) post-MV repair: EF 55-60%, normal RV, s/p MV repair with mild MR.  - Echo (9/21): EF 55-60%, s/p MV repair with no MR, mean gradient 5.  - Echo (10/22): EF 45-50%, patient was in atrial fibrillation.  - Echo (10/23): EF 25%, global hypokinesis, moderate RV dysfunction, s/p MV repair with no MR and no significant stenosis. The patient was in atrial fibrillation.    Monitors:    Stress testing:   Advanced imaging:    EKG:   EKG Interpretation Date/Time:  Tuesday June 29 2023 09:36:51 EDT Ventricular Rate:  67 PR Interval:  172 QRS Duration:  170 QT Interval:  460 QTC Calculation: 486 R Axis:   -65  Text Interpretation: Normal sinus rhythm Left axis deviation Right bundle branch block When compared with ECG of 01-Jan-2023 09:15, No significant change was found Confirmed by York Pellant [phone]) on 06/29/2023 10:09:32 AM    Recent Labs: 11/20/2022: B Natriuretic Peptide 388.9; Platelets 187 12/11/2022: Hemoglobin 17.0 01/01/2023: ALT 33; TSH 2.335 02/23/2023: BUN 32; Creatinine, Ser 1.37; Potassium 4.8; Sodium 138     Physical Exam:    VS:  BP 120/84 (BP Location: Left Arm, Patient Position: Sitting, Cuff  Size: Large)   Pulse 67   Ht 6\' 3"  (1.905 m)   Wt 261 lb 12.8 oz (118.8 kg)   SpO2 95%   BMI 32.72 kg/m     Wt Readings from Last 3 Encounters:  06/29/23 261 lb 12.8 oz (118.8 kg)  04/23/23 260 lb 8 oz (118.2 kg)  02/23/23 258 lb 9.6 oz (117.3 kg)     GEN:  Well nourished, well developed in no acute distress CARDIAC: RRR, no murmurs, rubs, gallops RESPIRATORY:  Normal work of breathing MUSCULOSKELETAL: no edema    ASSESSMENT & PLAN:    Atrial  fibrillation and atypical atrial flutter S/p MAZE procedure and LAA clip S/p DCCV x2 (8/20 and 2/24) With multiple recurrences, complicated by CHF with severely reduced EF Maintaining sinus rhythm on amiodarone I think EP study, attempted induction of atypical flutter, mapping and potential ablation are warranted.  We will see if there is any anatomical substrate or gaps from his prior ablation --additional substrate for A-fib ablation, and also see if we can induce and ablate his arrhythmia.  This will be a difficult and relatively long procedure.  I explained that there is certainly risk that we would not be able to induce or identify his rhythm. We will begin mapping with the Trula Slade since we will need high-definition mapping in case we induce a flutter.  Based upon the results of our maps and EP study, we may choose either pulsed field or radiofrequency for ablation. Continue to hold amiodarone until ablation Continue warfarin  We discussed the indication, rationale, logistics, anticipated benefits, and potential risks of the ablation procedure including but not limited to -- bleed at the groin access site, chest pain, damage to nearby organs such as the diaphragm, lungs, or esophagus, need for a drainage tube, or prolonged hospitalization. I explained that the risk for stroke, heart attack, need for open chest surgery, or even death is very low but not zero. he  expressed understanding and wishes to proceed.   Chronic systolic CHF Likely tachycardia-mediated Continue metoprolol XL 25, Entresto 49-51, Aldactone 25            Medication Adjustments/Labs and Tests Ordered: Current medicines are reviewed at length with the patient today.  Concerns regarding medicines are outlined above.  Orders Placed This Encounter  Procedures   EKG 12-Lead   No orders of the defined types were placed in this encounter.    Signed, Maurice Small, MD  06/29/2023 10:08 AM    [facility]

## 2023-06-29 NOTE — H&P (View-Only) (Signed)
 Electrophysiology Office Note:    Date:  06/29/2023   ID:  [name], DOB [birth year], MRN [id number]  PCP:  Pura Lenis, MD   [facility] Providers Cardiologist:  Dorn Lesches, MD Cardiology APP:  Emelia Josefa HERO, NP     Referring MD: Pura Lenis, MD   History of Present Illness:    [name] is a 67 y.o. male with a hx listed below, significant for CHFrEF (35-40%), severe MR s/p minimally invasive MV repair with MAZE and LA clip in 2020 referred for arrhythmia management.     He had very severe MR with partially flail posterior leaflet and required repair by invasive mitral valve surgery with maze and left atrial appendage clipping in 2020.  He remained in atrial fibrillation, however and was cardioverted in August 2020.  He had recurrence of atrial fibrillation with RVR and a decrease in EF to 45% in October 2022. In October 2023, EF was further decreased to 20-25%, and he was still noted to be in atrial fibrillation with RVR.       He presents today for follow-up prior to ablation of atrial fibrillation and flutter scheduled for September 12.  He reports that he has not had any recent changes in his condition.  He has felt reasonably well with amiodarone .     EKGs/Labs/Other Studies Reviewed Today:    Echocardiogram:  TEE 2/24 1. Left ventricular ejection fraction, by estimation, is 40 to 45% . The left ventricle has mildly decreased function. The left ventricle demonstrates global hypokinesis. There is mild concentric left ventricular hypertrophy. 2. Right ventricular systolic function is mildly reduced. The right ventricular size is normal. 3. The atrial appendage has been surgically clipped, no thrombus in residual appendage or in LA. Left atrial size was mildly dilated. 4. The mitral valve has been repaired. Mild mitral valve regurgitation. No evidence of mitral stenosis. The mean mitral valve gradient is 2. 0 mmHg. There is a prosthetic annuloplasty  ring present in the mitral position. Procedure Date: 5/ 19/ 20. 5. The aortic valve is tricuspid. Aortic valve regurgitation is not visualized. No aortic stenosis is present. 6. No ASD or PFO by color doppler.  - Echo (2/20) pre-MV repair in setting of AF: EF 35-40%, moderate RV dysfunction, severe MR.  - Echo (7/20) post-MV repair: EF 55-60%, normal RV, s/p MV repair with mild MR.  - Echo (9/21): EF 55-60%, s/p MV repair with no MR, mean gradient 5.  - Echo (10/22): EF 45-50%, patient was in atrial fibrillation.  - Echo (10/23): EF 25%, global hypokinesis, moderate RV dysfunction, s/p MV repair with no MR and no significant stenosis. The patient was in atrial fibrillation.    Monitors:    Stress testing:   Advanced imaging:    EKG:   EKG Interpretation Date/Time:  Tuesday June 29 2023 09:36:51 EDT Ventricular Rate:  67 PR Interval:  172 QRS Duration:  170 QT Interval:  460 QTC Calculation: 486 R Axis:   -65  Text Interpretation: Normal sinus rhythm Left axis deviation Right bundle branch block When compared with ECG of 01-Jan-2023 09:15, No significant change was found Confirmed by Nancey Scotts [phone]) on 06/29/2023 10:09:32 AM    Recent Labs: 11/20/2022: B Natriuretic Peptide 388.9; Platelets 187 12/11/2022: Hemoglobin 17.0 01/01/2023: ALT 33; TSH 2.335 02/23/2023: BUN 32; Creatinine, Ser 1.37; Potassium 4.8; Sodium 138     Physical Exam:    VS:  BP 120/84 (BP Location: Left Arm, Patient Position: Sitting, Cuff  Size: Large)   Pulse 67   Ht 6\' 3"  (1.905 m)   Wt 261 lb 12.8 oz (118.8 kg)   SpO2 95%   BMI 32.72 kg/m     Wt Readings from Last 3 Encounters:  06/29/23 261 lb 12.8 oz (118.8 kg)  04/23/23 260 lb 8 oz (118.2 kg)  02/23/23 258 lb 9.6 oz (117.3 kg)     GEN:  Well nourished, well developed in no acute distress CARDIAC: RRR, no murmurs, rubs, gallops RESPIRATORY:  Normal work of breathing MUSCULOSKELETAL: no edema    ASSESSMENT & PLAN:    Atrial  fibrillation and atypical atrial flutter S/p MAZE procedure and LAA clip S/p DCCV x2 (8/20 and 2/24) With multiple recurrences, complicated by CHF with severely reduced EF Maintaining sinus rhythm on amiodarone  I think EP study, attempted induction of atypical flutter, mapping and potential ablation are warranted.  We will see if there is any anatomical substrate or gaps from his prior ablation --additional substrate for A-fib ablation, and also see if we can induce and ablate his arrhythmia.  This will be a difficult and relatively long procedure.  I explained that there is certainly risk that we would not be able to induce or identify his rhythm. We will begin mapping with the May since we will need high-definition mapping in case we induce a flutter.  Based upon the results of our maps and EP study, we may choose either pulsed field or radiofrequency for ablation. Continue to hold amiodarone  until ablation Continue warfarin  We discussed the indication, rationale, logistics, anticipated benefits, and potential risks of the ablation procedure including but not limited to -- bleed at the groin access site, chest pain, damage to nearby organs such as the diaphragm, lungs, or esophagus, need for a drainage tube, or prolonged hospitalization. I explained that the risk for stroke, heart attack, need for open chest surgery, or even death is very low but not zero. he  expressed understanding and wishes to proceed.   Chronic systolic CHF Likely tachycardia-mediated Continue metoprolol  XL 25, Entresto  49-51, Aldactone  25            Medication Adjustments/Labs and Tests Ordered: Current medicines are reviewed at length with the patient today.  Concerns regarding medicines are outlined above.  Orders Placed This Encounter  Procedures   EKG 12-Lead   No orders of the defined types were placed in this encounter.    Signed, Eulas FORBES Furbish, MD  06/29/2023 10:08 AM    [facility]

## 2023-06-30 LAB — BASIC METABOLIC PANEL
BUN/Creatinine Ratio: 27 — ABNORMAL HIGH (ref 10–24)
BUN: 29 mg/dL — ABNORMAL HIGH (ref 8–27)
CO2: 29 mmol/L (ref 20–29)
Calcium: 9.3 mg/dL (ref 8.6–10.2)
Chloride: 99 mmol/L (ref 96–106)
Creatinine, Ser: 1.09 mg/dL (ref 0.76–1.27)
Glucose: 104 mg/dL — ABNORMAL HIGH (ref 70–99)
Potassium: 4.3 mmol/L (ref 3.5–5.2)
Sodium: 139 mmol/L (ref 134–144)
eGFR: 74 mL/min/{1.73_m2} (ref >59)

## 2023-06-30 LAB — PROTIME-INR
INR: 2.7 — ABNORMAL HIGH (ref 0.9–1.2)
Prothrombin Time: 27.6 s — ABNORMAL HIGH (ref 9.1–12.0)

## 2023-06-30 LAB — CBC
Hematocrit: 43.7 % (ref 37.5–51.0)
Hemoglobin: 14.5 g/dL (ref 13.0–17.7)
MCH: 31.7 pg (ref 26.6–33.0)
MCHC: 33.2 g/dL (ref 31.5–35.7)
MCV: 95 fL (ref 79–97)
Platelets: 184 10*3/uL (ref 150–450)
RBC: 4.58 x10E6/uL (ref 4.14–5.80)
RDW: 12.8 % (ref 11.6–15.4)
WBC: 4.3 10*3/uL (ref 3.4–10.8)

## 2023-07-02 ENCOUNTER — Encounter: Payer: Self-pay | Admitting: Orthopedic Surgery

## 2023-07-02 NOTE — Progress Notes (Signed)
Office Visit Note   Patient: [name]           Date of Birth: 01-09-1956           MRN: [id number] Visit Date: 06/22/2023              Requested by: Tracey Harries, MD [address] [place],  [place] [address] PCP: Tracey Harries, MD  Chief Complaint  Patient presents with   Left Leg - Follow-up      HPI: Patient is a 67 year old gentleman status post ulceration left leg from remote compartment syndrome.  Patient is currently using Silvadene dressing changes.  Assessment & Plan: Visit Diagnoses:  1. Leg wound, left, subsequent encounter     Plan: Kerecis micro graft was applied with a compression wrap.  Patient will change the outside compression and then begin routine wound care in 1 week.  Follow-Up Instructions: Return in about 4 weeks (around 07/20/2023).   Ortho Exam  Patient is alert, oriented, no adenopathy, well-dressed, normal affect, normal respiratory effort. Examination the wound bed continues to improve its healthy and viable.  There is a V-type incision at this time with each arm being 7 cm and 6 cm with the largest width of 2 cm.  There is healthy granulation tissue.  Continues to show slow healthy improvement.  Imaging: No results found.   Labs: Lab Results  Component Value Date   HGBA1C 5.4 01/29/2022   HGBA1C 5.2 03/16/2019   REPTSTATUS 01/07/2022 FINAL 01/02/2022   REPTSTATUS 01/08/2022 FINAL 01/02/2022   GRAMSTAIN  01/02/2022    FEW WBC PRESENT,BOTH PMN AND MONONUCLEAR RARE GRAM POSITIVE COCCI RARE GRAM NEGATIVE RODS Performed at [facility], [address], [place], [place] [address]    GRAMSTAIN  01/02/2022    FEW WBC PRESENT,BOTH PMN AND MONONUCLEAR NO ORGANISMS SEEN    CULT  01/02/2022    FEW STREPTOCOCCUS GROUP C Beta hemolytic streptococci are predictably susceptible to penicillin and other beta lactams. Susceptibility testing not routinely performed. ABUNDANT KLEBSIELLA OXYTOCA ABUNDANT  STENOTROPHOMONAS MALTOPHILIA    CULT  01/02/2022    NO ANAEROBES ISOLATED Performed at [facility], [address], [place], [place] [address]    LABORGA KLEBSIELLA OXYTOCA 01/02/2022   LABORGA STENOTROPHOMONAS MALTOPHILIA 01/02/2022     Lab Results  Component Value Date   ALBUMIN 4.0 01/01/2023   ALBUMIN 4.0 11/20/2022   ALBUMIN 2.5 (L) 01/30/2022    Lab Results  Component Value Date   MG 2.1 01/29/2022   MG 2.1 06/19/2019   MG 2.2 03/25/2019   No results found for: "VD25OH"  No results found for: "PREALBUMIN"    Latest Ref Rng & Units 06/29/2023    7:43 AM 12/11/2022   11:40 AM 11/20/2022   12:12 PM  CBC EXTENDED  WBC 3.4 - 10.8 x10E3/uL 4.3   6.7   RBC 4.14 - 5.80 x10E6/uL 4.58   4.80   Hemoglobin 13.0 - 17.7 g/dL 34.7  42.5  95.6   HCT 37.5 - 51.0 % 43.7  50.0  46.7   Platelets 150 - 450 x10E3/uL 184   187      There is no height or weight on file to calculate BMI.  Orders:  No orders of the defined types were placed in this encounter.  No orders of the defined types were placed in this encounter.    Procedures: No procedures performed  Clinical Data: No additional findings.  ROS:  All  other systems negative, except as noted in the HPI. Review of Systems  Objective: Vital Signs: There were no vitals taken for this visit.  Specialty Comments:  No specialty comments available.  PMFS History: Patient Active Problem List   Diagnosis Date Noted   Left ventricular dysfunction 10/13/2022   Rectal bleeding 01/30/2022   Abnormal finding on imaging 01/30/2022   GIB (gastrointestinal bleeding) 01/29/2022   Hyponatremia 01/29/2022   Hypocalcemia 01/29/2022   Alcohol abuse 01/29/2022   AKI (acute kidney injury) (HCC) 01/29/2022   Symptomatic anemia 01/29/2022   Venous stasis dermatitis of both lower extremities    Bleeding on Coumadin    Leg wound, left, subsequent encounter 12/30/2021   Compartment syndrome of left lower extremity (HCC)  09/22/2021   Status post surgery 09/22/2021   Compartment syndrome of left upper extremity (HCC) 09/22/2021   Hyperlipidemia 12/13/2020   Atrial fibrillation (HCC) 06/10/2020   Long term (current) use of anticoagulants 06/10/2020   Secondary hypercoagulable state (HCC) 04/24/2020   AV junctional bradycardia 03/22/2019   S/P minimally invasive mitral valve repair  03/21/2019   S/P Maze operation for atrial fibrillation 03/21/2019   Dilated aortic root (HCC) 01/04/2019   Dilated cardiomyopathy (HCC) 01/04/2019   Atrial flutter with rapid ventricular response (HCC)    Mitral valve prolapse    Hypertensive urgency 12/30/2018   Acute on chronic systolic (congestive) heart failure (HCC) 12/30/2018   Acute systolic heart failure (HCC) 12/30/2018   Chronic systolic (congestive) heart failure (HCC)    Persistent atrial fibrillation (HCC) 12/02/2018   Right hamstring muscle strain 07/20/2018   Essential hypertension 02/07/2016   Severe mitral regurgitation    Annual physical exam 11/16/2014   Arthritis of left knee 10/23/2014   Status post total left knee replacement 10/23/2014   Arthritis of knee, right 01/19/2014   Status post total knee replacement 01/19/2014   Benign paroxysmal positional vertigo 10/30/2013   Testicular hypofunction 03/24/2012   Past Medical History:  Diagnosis Date   Acute on chronic systolic (congestive) heart failure (HCC) 12/30/2018   Allergy    Arthritis    Atrial flutter with rapid ventricular response (HCC)    Chronic systolic (congestive) heart failure (HCC)    COVID 10/2021   mild   Dilated aortic root (HCC) 01/04/2019   Dilated cardiomyopathy (HCC) 01/04/2019   Dysrhythmia    Heart murmur    History of blood transfusion    01/27/22   History of colon polyps    Hypertension    Insomnia    Mitral valve prolapse    Mitral valve regurgitation    Persistent atrial fibrillation (HCC) 12/02/2018   S/P Maze operation for atrial fibrillation 03/21/2019    Complete bilateral atrial lesion set using cryothermy and bipolar radiofrequency ablation with clipping of LA appendage via right mini thoracotomy approach   S/P minimally invasive mitral valve repair 03/21/2019   Complex valvuloplasty including triangular resection of posterior leaflet, artificial Gore-tex neochord placement x4 and 36 mm Sorin Memo 4D ring annuloplasty via right mini thoracotomy approach   Seizures (HCC)    had one approx. 30 yrs ago,has not had any since; due to alchol and no sleep   Severe mitral regurgitation     Family History  Problem Relation Age of Onset   Hypertension Father    Colon cancer Father        dx in his late 23's   Diabetes Mother    Stomach cancer Neg Hx    Esophageal cancer Neg  Hx    Pancreatic cancer Neg Hx    Rectal cancer Neg Hx     Past Surgical History:  Procedure Laterality Date   ANKLE FUSION  left   Dec. 2012   APPLICATION OF WOUND VAC Left 09/22/2021   Procedure: APPLICATION OF WOUND VAC;  Surgeon: Kathryne Hitch, MD;  Location: [facility] OR;  Service: Orthopedics;  Laterality: Left;   APPLICATION OF WOUND VAC Left 12/30/2021   Procedure: APPLICATION OF WOUND VAC;  Surgeon: Kathryne Hitch, MD;  Location: [facility] OR;  Service: Orthopedics;  Laterality: Left;   APPLICATION OF WOUND VAC Left 04/10/2022   Procedure: APPLICATION OF WOUND VAC;  Surgeon: Nadara Mustard, MD;  Location: [facility] OR;  Service: Orthopedics;  Laterality: Left;   ARTHROSCOPY KNEE W/ DRILLING  bilateral   2012   BIOPSY  01/30/2022   Procedure: BIOPSY;  Surgeon: Meridee Score Netty Starring., MD;  Location: [facility];  Service: Gastroenterology;;   CARDIAC VALVE REPLACEMENT     CARDIOVERSION N/A 01/03/2019   Procedure: CARDIOVERSION;  Surgeon: Quintella Reichert, MD;  Location: [facility] ENDOSCOPY;  Service: Cardiovascular;  Laterality: N/A;   CARDIOVERSION N/A 06/23/2019   Procedure: CARDIOVERSION;  Surgeon: Pricilla Riffle, MD;  Location: [facility] ENDOSCOPY;  Service: Cardiovascular;   Laterality: N/A;   CARDIOVERSION N/A 12/11/2022   Procedure: CARDIOVERSION;  Surgeon: Laurey Morale, MD;  Location: [facility] ENDOSCOPY;  Service: Cardiovascular;  Laterality: N/A;   CLIPPING OF ATRIAL APPENDAGE  03/21/2019   Procedure: Clipping Of Atrial Appendage using 45mm Atricure Pro2 Clip;  Surgeon: Purcell Nails, MD;  Location: [facility] OR;  Service: Open Heart Surgery;;   COLONOSCOPY     x2   COLONOSCOPY N/A 01/30/2022   Procedure: COLONOSCOPY;  Surgeon: Lemar Lofty., MD;  Location: [facility] ENDOSCOPY;  Service: Gastroenterology;  Laterality: N/A;   ESOPHAGOGASTRODUODENOSCOPY N/A 01/30/2022   Procedure: ESOPHAGOGASTRODUODENOSCOPY (EGD);  Surgeon: Lemar Lofty., MD;  Location: [facility]OSCOPY;  Service: Gastroenterology;  Laterality: N/A;   FOOT ARTHRODESIS  06/23/2012   Procedure: ARTHRODESIS FOOT;  Surgeon: Toni Arthurs, MD;  Location: [facility] OR;  Service: Orthopedics;  Laterality: Left;  Left Subtalar and Talonavicular Joint Revision Arthrodesis  Aspiration of Bone Marrow from Left Hip    HAIR TRANSPLANT     HARDWARE REMOVAL  06/23/2012   Procedure: HARDWARE REMOVAL;  Surgeon: Toni Arthurs, MD;  Location: [facility] OR;  Service: Orthopedics;  Laterality: Left;  Removal of Deep Implant  X's 3   I & D EXTREMITY Left 09/23/2021   Procedure: IRRIGATION AND DEBRIDEMENT OF LEG;  Surgeon: Kathryne Hitch, MD;  Location: [facility] OR;  Service: Orthopedics;  Laterality: Left;   I & D EXTREMITY Left 09/26/2021   Procedure: REPEAT IRRIGATION AND DEBRIDEMENT LEFT LEG, POSSIBLE WOUND CLOSURE, POSSIBLE VAC CHANGE, POSSIBLE SKIN GRAFT;  Surgeon: Kathryne Hitch, MD;  Location: [facility] OR;  Service: Orthopedics;  Laterality: Left;   I & D EXTREMITY Left 12/30/2021   Procedure: IRRIGATION AND DEBRIDEMENT LEFT LOWER LEG WOUND;  Surgeon: Kathryne Hitch, MD;  Location: [facility] OR;  Service: Orthopedics;  Laterality: Left;   I & D EXTREMITY Left 01/02/2022   Procedure: LEFT LEG DEBRIDEMENT AND TISSUE GRAFT;   Surgeon: Nadara Mustard, MD;  Location: [facility] OR;  Service: Orthopedics;  Laterality: Left;   INCISION AND DRAINAGE WOUND WITH FASCIOTOMY Left 09/22/2021   Procedure: INCISION AND DRAINAGE WOUND WITH FASCIOTOMY;  Surgeon: Kathryne Hitch, MD;  Location: [facility] OR;  Service: Orthopedics;  Laterality: Left;  INSERTION OF MESH N/A 07/10/2016   Procedure: INSERTION OF MESH;  Surgeon: Abigail Miyamoto, MD;  [facility];  Service: General;  Laterality: N/A;   JOINT REPLACEMENT     right hip  01-2011   LEFT HEART CATH AND CORONARY ANGIOGRAPHY N/A 03/16/2019   Procedure: LEFT HEART CATH AND CORONARY ANGIOGRAPHY;  Surgeon: Tonny Bollman, MD;  Location: [facility] INVASIVE CV LAB;  Service: Cardiovascular;  Laterality: N/A;   LIMB SPARING RESECTION HIP W/ SADDLE JOINT REPLACEMENT Right    MINIMALLY INVASIVE MAZE PROCEDURE N/A 03/21/2019   Procedure: MINIMALLY INVASIVE MAZE PROCEDURE;  Surgeon: Purcell Nails, MD;  Location: [facility] OR;  Service: Open Heart Surgery;  Laterality: N/A;   MITRAL VALVE REPAIR Right 03/21/2019   Procedure: MINIMALLY INVASIVE MITRAL VALVE REPAIR (MVR) using Memo 4D ring size 36;  Surgeon: Purcell Nails, MD;  Location: [facility] OR;  Service: Open Heart Surgery;  Laterality: Right;   POLYPECTOMY  01/30/2022   Procedure: POLYPECTOMY;  Surgeon: Mansouraty, Netty Starring., MD;  Location: [facility];  Service: Gastroenterology;;   SKIN SPLIT GRAFT Left 04/10/2022   Procedure: APPLY SKIN GRAFT TO LEFT LEG WOUND;  Surgeon: Nadara Mustard, MD;  Location: [facility] OR;  Service: Orthopedics;  Laterality: Left;   TEE WITHOUT CARDIOVERSION N/A 01/03/2019   Procedure: TRANSESOPHAGEAL ECHOCARDIOGRAM (TEE);  Surgeon: Quintella Reichert, MD;  Location: [facility] ENDOSCOPY;  Service: Cardiovascular;  Laterality: N/A;   TEE WITHOUT CARDIOVERSION N/A 03/21/2019   Procedure: TRANSESOPHAGEAL ECHOCARDIOGRAM (TEE);  Surgeon: Purcell Nails, MD;  Location: [facility] OR;  Service: Open Heart Surgery;  Laterality: N/A;    TEE WITHOUT CARDIOVERSION N/A 12/11/2022   Procedure: TRANSESOPHAGEAL ECHOCARDIOGRAM (TEE);  Surgeon: Laurey Morale, MD;  Location: [facility] ENDOSCOPY;  Service: Cardiovascular;  Laterality: N/A;   TEMPORARY PACEMAKER N/A 03/22/2019   Procedure: TEMPORARY PACEMAKER;  Surgeon: Lyn Records, MD;  Location: [facility] INVASIVE CV LAB;  Service: Cardiovascular;  Laterality: N/A;   TOTAL KNEE ARTHROPLASTY Right 01/19/2014   Procedure: RIGHT TOTAL KNEE ARTHROPLASTY, Steroid injection left knee;  Surgeon: Kathryne Hitch, MD;  Location: [facility] ORS;  Service: Orthopedics;  Laterality: Right;   TOTAL KNEE ARTHROPLASTY Left 10/23/2014   Procedure: LEFT TOTAL KNEE ARTHROPLASTY;  Surgeon: Kathryne Hitch, MD;  Location: [facility] ORS;  Service: Orthopedics;  Laterality: Left;   UMBILICAL HERNIA REPAIR N/A 07/10/2016   Procedure: UMBILICAL HERNIA REPAIR;  Surgeon: Abigail Miyamoto, MD;  Location:  SURGERY CENTER;  Service: General;  Laterality: N/A;   Social History   Occupational History   Occupation: Teaching laboratory technician  Tobacco Use   Smoking status: Never   Smokeless tobacco: Never  Vaping Use   Vaping status: Never Used  Substance and Sexual Activity   Alcohol use: Not Currently    Alcohol/week: 21.0 standard drinks of alcohol    Types: 21 Shots of liquor per week   Drug use: No   Sexual activity: Yes

## 2023-07-06 ENCOUNTER — Ambulatory Visit: Payer: Medicare HMO | Attending: Cardiovascular Disease

## 2023-07-06 DIAGNOSIS — I4819 Other persistent atrial fibrillation: Secondary | ICD-10-CM | POA: Diagnosis not present

## 2023-07-06 DIAGNOSIS — Z5181 Encounter for therapeutic drug level monitoring: Secondary | ICD-10-CM

## 2023-07-06 DIAGNOSIS — Z7901 Long term (current) use of anticoagulants: Secondary | ICD-10-CM | POA: Diagnosis not present

## 2023-07-06 DIAGNOSIS — I4892 Unspecified atrial flutter: Secondary | ICD-10-CM

## 2023-07-06 LAB — POCT INR: INR: 3.5 — AB (ref 2.0–3.0)

## 2023-07-06 NOTE — Patient Instructions (Signed)
TAKE 0.5 TABLET TODAY ONLY THEN Continue taking warfarin 1 tablet daily except 1.5 tablets on Wednesdays and Fridays.  Recheck INR in 1 week. ABLATION 07/15/23-NEEDS WEEKLY CHECKS [facility] [phone].

## 2023-07-08 ENCOUNTER — Ambulatory Visit (HOSPITAL_COMMUNITY)
Admission: RE | Admit: 2023-07-08 | Discharge: 2023-07-08 | Disposition: A | Discharge status: Home / Self Care | Payer: Medicare HMO | Source: Ambulatory Visit | Attending: Cardiovascular Disease | Admitting: Cardiovascular Disease

## 2023-07-08 DIAGNOSIS — I4819 Other persistent atrial fibrillation: Secondary | ICD-10-CM | POA: Insufficient documentation

## 2023-07-08 DIAGNOSIS — I4892 Unspecified atrial flutter: Secondary | ICD-10-CM | POA: Insufficient documentation

## 2023-07-08 MED ORDER — IOHEXOL 350 MG/ML SOLN
100.0000 mL | Freq: Once | INTRAVENOUS | Status: AC | PRN
  Administered 2023-07-08: 100 mL via INTRAVENOUS

## 2023-07-14 ENCOUNTER — Encounter (HOSPITAL_COMMUNITY): Payer: Self-pay | Admitting: Cardiovascular Disease

## 2023-07-14 ENCOUNTER — Ambulatory Visit: Payer: Medicare HMO | Attending: Internal Medicine

## 2023-07-14 DIAGNOSIS — I4892 Unspecified atrial flutter: Secondary | ICD-10-CM

## 2023-07-14 DIAGNOSIS — Z5181 Encounter for therapeutic drug level monitoring: Secondary | ICD-10-CM

## 2023-07-14 DIAGNOSIS — I4819 Other persistent atrial fibrillation: Secondary | ICD-10-CM

## 2023-07-14 LAB — POCT INR: INR: 2.4 (ref 2.0–3.0)

## 2023-07-14 NOTE — Patient Instructions (Signed)
Description   Continue taking warfarin 1 tablet daily except 1.5 tablets on Wednesdays and Fridays.   Recheck INR in 1 week. ABLATION 07/15/23 [facility] [phone].

## 2023-07-14 NOTE — Pre-Procedure Instructions (Signed)
Attempted to call patient regarding procedure instructions.  Left voicemail on the following items: Arrival time 5:15 Nothing to eat or drink after midnight No meds AM of procedure Responsible person to drive you home and stay with you for 24 hrs  Have you missed any doses of anti-coagulant on Coumadin- if you have missed any doses please let us know.

## 2023-07-14 NOTE — Anesthesia Preprocedure Evaluation (Addendum)
Anesthesia Evaluation  Patient identified by MRN, date of birth, ID band Patient awake    Reviewed: Allergy & Precautions, NPO status , Patient's Chart, lab work & pertinent test results, reviewed documented beta blocker date and time   Airway Mallampati: III  TM Distance: >3 FB Neck ROM: Full    Dental  (+) Chipped, Poor Dentition,    Pulmonary neg pulmonary ROS   Pulmonary exam normal breath sounds clear to auscultation       Cardiovascular hypertension, Pt. on medications and Pt. on home beta blockers +CHF  Normal cardiovascular exam+ dysrhythmias Atrial Fibrillation + Valvular Problems/Murmurs MR  Rhythm:Regular Rate:Normal  S/P Mitral valve repair S/P Maze procedure 05/2020  EKG 06/29/23 NSR, RBBB pattern, LAD  Echo 12/11/22  1. Left ventricular ejection fraction, by estimation, is 40 to 45%. The  left ventricle has mildly decreased function. The left ventricle  demonstrates global hypokinesis. There is mild concentric left ventricular  hypertrophy.   2. Right ventricular systolic function is mildly reduced. The right  ventricular size is normal.   3. The atrial appendage has been surgically clipped, no thrombus in  residual appendage or in LA. Left atrial size was mildly dilated.   4. The mitral valve has been repaired. Mild mitral valve regurgitation.  No evidence of mitral stenosis. The mean mitral valve gradient is 2.0  mmHg. There is a prosthetic annuloplasty ring present in the mitral  position. Procedure Date: 03/21/19.   5. The aortic valve is tricuspid. Aortic valve regurgitation is not  visualized. No aortic stenosis is present.   6. No ASD or PFO by color doppler.     Neuro/Psych Seizures -, Well Controlled,   Neuromuscular disease  negative psych ROS   GI/Hepatic ,,,(+)     substance abuse  alcohol use  Endo/Other    Renal/GU Renal InsufficiencyRenal disease  negative genitourinary    Musculoskeletal  (+) Arthritis , Osteoarthritis,    Abdominal  (+) + obese  Peds  Hematology  (+) Blood dyscrasia, anemia Coumadin therapy INR 3.5 on 9/3 2.2 at 0601 this am   Anesthesia Other Findings   Reproductive/Obstetrics                             Anesthesia Physical Anesthesia Plan  ASA: 3  Anesthesia Plan: General   Post-op Pain Management: Minimal or no pain anticipated   Induction: Intravenous  PONV Risk Score and Plan: 2 and Treatment may vary due to age or medical condition and Ondansetron  Airway Management Planned: Oral ETT  Additional Equipment: Arterial line  Intra-op Plan:   Post-operative Plan: Extubation in OR  Informed Consent: I have reviewed the patients History and Physical, chart, labs and discussed the procedure including the risks, benefits and alternatives for the proposed anesthesia with the patient or authorized representative who has indicated his/her understanding and acceptance.     Dental advisory given  Plan Discussed with: CRNA and Anesthesiologist  Anesthesia Plan Comments:        Anesthesia Quick Evaluation

## 2023-07-15 ENCOUNTER — Other Ambulatory Visit (HOSPITAL_COMMUNITY): Payer: Self-pay

## 2023-07-15 ENCOUNTER — Ambulatory Visit (HOSPITAL_COMMUNITY): Payer: Medicare HMO | Admitting: Anesthesiology

## 2023-07-15 ENCOUNTER — Ambulatory Visit (HOSPITAL_COMMUNITY)
Admission: RE | Admit: 2023-07-15 | Discharge: 2023-07-15 | Disposition: A | Discharge status: Home / Self Care | Payer: Medicare HMO | Attending: Cardiovascular Disease | Admitting: Cardiovascular Disease

## 2023-07-15 ENCOUNTER — Encounter (HOSPITAL_COMMUNITY)
Admission: RE | Disposition: A | Discharge status: Home / Self Care | Payer: Medicare HMO | Source: Home / Self Care | Attending: Cardiovascular Disease

## 2023-07-15 ENCOUNTER — Other Ambulatory Visit: Payer: Self-pay

## 2023-07-15 DIAGNOSIS — I4891 Unspecified atrial fibrillation: Secondary | ICD-10-CM | POA: Insufficient documentation

## 2023-07-15 DIAGNOSIS — I5022 Chronic systolic (congestive) heart failure: Secondary | ICD-10-CM | POA: Insufficient documentation

## 2023-07-15 DIAGNOSIS — I484 Atypical atrial flutter: Secondary | ICD-10-CM | POA: Diagnosis not present

## 2023-07-15 DIAGNOSIS — I4892 Unspecified atrial flutter: Secondary | ICD-10-CM

## 2023-07-15 DIAGNOSIS — I4811 Longstanding persistent atrial fibrillation: Secondary | ICD-10-CM

## 2023-07-15 DIAGNOSIS — Z7901 Long term (current) use of anticoagulants: Secondary | ICD-10-CM | POA: Insufficient documentation

## 2023-07-15 DIAGNOSIS — Z6832 Body mass index (BMI) 32.0-32.9, adult: Secondary | ICD-10-CM | POA: Insufficient documentation

## 2023-07-15 DIAGNOSIS — E669 Obesity, unspecified: Secondary | ICD-10-CM | POA: Diagnosis not present

## 2023-07-15 DIAGNOSIS — Z8669 Personal history of other diseases of the nervous system and sense organs: Secondary | ICD-10-CM | POA: Diagnosis not present

## 2023-07-15 HISTORY — PX: ATRIAL FIBRILLATION ABLATION: EP1191

## 2023-07-15 LAB — PROTIME-INR
INR: 2.2 — ABNORMAL HIGH (ref 0.8–1.2)
Prothrombin Time: 24.6 s — ABNORMAL HIGH (ref 11.4–15.2)

## 2023-07-15 SURGERY — ATRIAL FIBRILLATION ABLATION
Anesthesia: General

## 2023-07-15 MED ORDER — COLCHICINE 0.6 MG PO TABS
0.6000 mg | ORAL_TABLET | Freq: Two times a day (BID) | ORAL | 0 refills | Status: DC
Start: 2023-07-15 — End: 2023-08-12
  Filled 2023-07-15: qty 10, 5d supply, fill #0

## 2023-07-15 MED ORDER — SUGAMMADEX SODIUM 200 MG/2ML IV SOLN
INTRAVENOUS | Status: DC | PRN
Start: 2023-07-15 — End: 2023-07-15
  Administered 2023-07-15: 200 mg via INTRAVENOUS

## 2023-07-15 MED ORDER — PROTAMINE SULFATE 10 MG/ML IV SOLN
INTRAVENOUS | Status: DC | PRN
Start: 2023-07-15 — End: 2023-07-15
  Administered 2023-07-15: 40 mg via INTRAVENOUS

## 2023-07-15 MED ORDER — LIDOCAINE 2% (20 MG/ML) 5 ML SYRINGE
INTRAMUSCULAR | Status: DC | PRN
  Administered 2023-07-15: 80 mg via INTRAVENOUS

## 2023-07-15 MED ORDER — SODIUM CHLORIDE 0.9 % IV SOLN: INTRAVENOUS | Status: DC

## 2023-07-15 MED ORDER — FENTANYL CITRATE (PF) 100 MCG/2ML IJ SOLN
INTRAMUSCULAR | Status: AC
  Filled 2023-07-15: qty 2

## 2023-07-15 MED ORDER — DEXAMETHASONE SODIUM PHOSPHATE 10 MG/ML IJ SOLN
INTRAMUSCULAR | Status: DC | PRN
  Administered 2023-07-15: 10 mg via INTRAVENOUS

## 2023-07-15 MED ORDER — ATROPINE SULFATE 1 MG/10ML IJ SOSY
PREFILLED_SYRINGE | INTRAMUSCULAR | Status: AC
  Filled 2023-07-15: qty 10

## 2023-07-15 MED ORDER — OXYCODONE HCL 5 MG/5ML PO SOLN: 5.0000 mg | Freq: Once | ORAL | Status: AC | PRN

## 2023-07-15 MED ORDER — HEPARIN SODIUM (PORCINE) 1000 UNIT/ML IJ SOLN
INTRAMUSCULAR | Status: AC
  Filled 2023-07-15: qty 10

## 2023-07-15 MED ORDER — PHENYLEPHRINE HCL-NACL 20-0.9 MG/250ML-% IV SOLN
INTRAVENOUS | Status: DC | PRN
  Administered 2023-07-15: 50 ug/min via INTRAVENOUS

## 2023-07-15 MED ORDER — PROPOFOL 10 MG/ML IV BOLUS
INTRAVENOUS | Status: DC | PRN
  Administered 2023-07-15: 160 mg via INTRAVENOUS

## 2023-07-15 MED ORDER — PHENYLEPHRINE 80 MCG/ML (10ML) SYRINGE FOR IV PUSH (FOR BLOOD PRESSURE SUPPORT)
PREFILLED_SYRINGE | INTRAVENOUS | Status: DC | PRN
  Administered 2023-07-15: 160 ug via INTRAVENOUS

## 2023-07-15 MED ORDER — HEPARIN SODIUM (PORCINE) 1000 UNIT/ML IJ SOLN
INTRAMUSCULAR | Status: DC | PRN
Start: 2023-07-15 — End: 2023-07-15
  Administered 2023-07-15: 20000 [IU] via INTRAVENOUS

## 2023-07-15 MED ORDER — ONDANSETRON HCL 4 MG/2ML IJ SOLN: 4.0000 mg | Freq: Four times a day (QID) | INTRAMUSCULAR | Status: DC | PRN

## 2023-07-15 MED ORDER — HEPARIN (PORCINE) IN NACL 1000-0.9 UT/500ML-% IV SOLN
INTRAVENOUS | Status: DC | PRN
  Administered 2023-07-15 (×4): 500 mL

## 2023-07-15 MED ORDER — HYDROMORPHONE HCL 1 MG/ML IJ SOLN: 0.2500 mg | INTRAMUSCULAR | Status: DC | PRN

## 2023-07-15 MED ORDER — ROCURONIUM BROMIDE 10 MG/ML (PF) SYRINGE
PREFILLED_SYRINGE | INTRAVENOUS | Status: DC | PRN
  Administered 2023-07-15: 30 mg via INTRAVENOUS
  Administered 2023-07-15: 20 mg via INTRAVENOUS
  Administered 2023-07-15: 60 mg via INTRAVENOUS

## 2023-07-15 MED ORDER — MIDAZOLAM HCL 5 MG/5ML IJ SOLN
INTRAMUSCULAR | Status: AC
  Filled 2023-07-15: qty 5

## 2023-07-15 MED ORDER — OXYCODONE HCL 5 MG PO TABS
5.0000 mg | ORAL_TABLET | Freq: Once | ORAL | Status: AC | PRN
  Administered 2023-07-15: 5 mg via ORAL
  Filled 2023-07-15: qty 1

## 2023-07-15 MED ORDER — FENTANYL CITRATE (PF) 250 MCG/5ML IJ SOLN
INTRAMUSCULAR | Status: DC | PRN
  Administered 2023-07-15 (×2): 100 ug via INTRAVENOUS

## 2023-07-15 MED ORDER — ONDANSETRON HCL 4 MG/2ML IJ SOLN: 4.0000 mg | Freq: Once | INTRAMUSCULAR | Status: DC | PRN

## 2023-07-15 MED ORDER — ONDANSETRON HCL 4 MG/2ML IJ SOLN
INTRAMUSCULAR | Status: DC | PRN
  Administered 2023-07-15: 4 mg via INTRAVENOUS

## 2023-07-15 MED ORDER — MIDAZOLAM HCL 2 MG/2ML IJ SOLN
INTRAMUSCULAR | Status: DC | PRN
  Administered 2023-07-15: 1 mg via INTRAVENOUS
  Administered 2023-07-15: 2 mg via INTRAVENOUS
  Administered 2023-07-15 (×2): 1 mg via INTRAVENOUS

## 2023-07-15 SURGICAL SUPPLY — 23 items
BAG SNAP BAND KOVER 36X36 (MISCELLANEOUS) IMPLANT
BLANKET WARM UNDERBOD FULL ACC (MISCELLANEOUS) ×1 IMPLANT
CATH 8FR REPROCESSED SOUNDSTAR (CATHETERS) ×1 IMPLANT
CATH 8FR SOUNDSTAR REPROCESSED (CATHETERS) IMPLANT
CATH OCTARAY 2.0 F 3-3-3-3-3 (CATHETERS) IMPLANT
CATH SMTCH THERMOCOOL SF DF (CATHETERS) IMPLANT
CATH SMTCH THERMOCOOL SF FJ (CATHETERS) IMPLANT
CATH WEB BI DIR CSDF CRV REPRO (CATHETERS) IMPLANT
CLOSURE PERCLOSE PROSTYLE (VASCULAR PRODUCTS) IMPLANT
COVER SWIFTLINK CONNECTOR (BAG) ×1 IMPLANT
DEVICE CLOSURE MYNXGRIP 6/7F (Vascular Products) IMPLANT
DILATOR VESSEL 38 20CM 16FR (INTRODUCER) IMPLANT
GUIDEWIRE INQWIRE 1.5J.035X260 (WIRE) IMPLANT
INQWIRE 1.5J .035X260CM (WIRE) ×1
PACK EP LATEX FREE (CUSTOM PROCEDURE TRAY) ×1
PACK EP LF (CUSTOM PROCEDURE TRAY) ×1 IMPLANT
PAD DEFIB RADIO PHYSIO CONN (PAD) ×1 IMPLANT
PATCH CARTO3 (PAD) IMPLANT
SHEATH PINNACLE 8F 10CM (SHEATH) IMPLANT
SHEATH PINNACLE 9F 10CM (SHEATH) IMPLANT
SHEATH PROBE COVER 6X72 (BAG) IMPLANT
SHEATH WIRE KIT BAYLIS SL1 (KITS) IMPLANT
TUBING SMART ABLATE COOLFLOW (TUBING) IMPLANT

## 2023-07-15 NOTE — Anesthesia Procedure Notes (Signed)
Procedure Name: Intubation Date/Time: 07/15/2023 7:54 AM  Performed by: Mal Amabile, MDPre-anesthesia Checklist: Patient identified, Emergency Drugs available, Suction available, Patient being monitored and Timeout performed Patient Re-evaluated:Patient Re-evaluated prior to induction Oxygen Delivery Method: Circle system utilized and Simple face mask Preoxygenation: Pre-oxygenation with 100% oxygen Induction Type: IV induction Ventilation: Mask ventilation without difficulty Laryngoscope Size: Mac and 3 Grade View: Grade II Tube type: Oral Tube size: 7.5 mm Number of attempts: 1 Airway Equipment and Method: Stylet Placement Confirmation: ETT inserted through vocal cords under direct vision, positive ETCO2 and breath sounds checked- equal and bilateral Secured at: 22 cm Dental Injury: Teeth and Oropharynx as per pre-operative assessment

## 2023-07-15 NOTE — Progress Notes (Signed)
Pt ambulated to and from bathroom to void with no signs of oozing from bilateral groin sites  

## 2023-07-15 NOTE — Anesthesia Postprocedure Evaluation (Signed)
Anesthesia Post Note  Patient: [name]  Procedure(s) Performed: ATRIAL FIBRILLATION ABLATION     Patient location during evaluation: PACU Anesthesia Type: General Level of consciousness: awake and alert and oriented Pain management: pain level controlled Vital Signs Assessment: post-procedure vital signs reviewed and stable Respiratory status: spontaneous breathing, nonlabored ventilation and respiratory function stable Cardiovascular status: blood pressure returned to baseline and stable Postop Assessment: no apparent nausea or vomiting Anesthetic complications: no   There were no known notable events for this encounter.  Last Vitals:  Vitals:   07/15/23 1200 07/15/23 1230  BP: 131/80 121/71  Pulse: 69 76  Resp: 16 (!) 22  Temp:    SpO2: 92% 94%    Last Pain:  Vitals:   07/15/23 1109  TempSrc:   PainSc: 8                  Adryanna Friedt A.

## 2023-07-15 NOTE — Discharge Instructions (Signed)

## 2023-07-15 NOTE — Transfer of Care (Signed)
Immediate Anesthesia Transfer of Care Note  Patient: [name]  Procedure(s) Performed: ATRIAL FIBRILLATION ABLATION  Patient Location: PACU  Anesthesia Type:General  Level of Consciousness: awake, alert , and oriented  Airway & Oxygen Therapy: Patient Spontanous Breathing and Patient connected to nasal cannula oxygen  Post-op Assessment: Report given to RN and Post -op Vital signs reviewed and stable  Post vital signs: Reviewed and stable  Last Vitals:  Vitals Value Taken Time  BP    Temp    Pulse    Resp    SpO2      Last Pain:  Vitals:   07/15/23 0652  PainSc: 0-No pain         Complications: No notable events documented.

## 2023-07-15 NOTE — Interval H&P Note (Signed)
History and Physical Interval Note:  07/15/2023 7:24 AM  [name]  has presented today for surgery, with the diagnosis of afib.  The various methods of treatment have been discussed with the patient and family. After consideration of risks, benefits and other options for treatment, the patient has consented to  Procedure(s): ATRIAL FIBRILLATION ABLATION (N/A) as a surgical intervention.  The patient's history has been reviewed, patient examined, no change in status, stable for surgery.  I have reviewed the patient's chart and labs.  Questions were answered to the patient's satisfaction.     [name] [name]

## 2023-07-16 ENCOUNTER — Encounter (HOSPITAL_COMMUNITY): Payer: Self-pay | Admitting: Cardiovascular Disease

## 2023-07-20 ENCOUNTER — Ambulatory Visit: Payer: Medicare HMO | Admitting: Orthopedic Surgery

## 2023-07-20 DIAGNOSIS — T79A22D Traumatic compartment syndrome of left lower extremity, subsequent encounter: Secondary | ICD-10-CM

## 2023-07-20 DIAGNOSIS — S81802D Unspecified open wound, left lower leg, subsequent encounter: Secondary | ICD-10-CM | POA: Diagnosis not present

## 2023-07-21 ENCOUNTER — Ambulatory Visit: Payer: Medicare HMO | Attending: Cardiovascular Disease | Admitting: *Deleted

## 2023-07-21 ENCOUNTER — Encounter: Payer: Self-pay | Admitting: Orthopedic Surgery

## 2023-07-21 DIAGNOSIS — Z5181 Encounter for therapeutic drug level monitoring: Secondary | ICD-10-CM | POA: Diagnosis not present

## 2023-07-21 DIAGNOSIS — I4819 Other persistent atrial fibrillation: Secondary | ICD-10-CM | POA: Diagnosis not present

## 2023-07-21 DIAGNOSIS — I4892 Unspecified atrial flutter: Secondary | ICD-10-CM | POA: Diagnosis not present

## 2023-07-21 DIAGNOSIS — Z7901 Long term (current) use of anticoagulants: Secondary | ICD-10-CM

## 2023-07-21 DIAGNOSIS — I4811 Longstanding persistent atrial fibrillation: Secondary | ICD-10-CM

## 2023-07-21 LAB — POCT INR: INR: 2.1 (ref 2.0–3.0)

## 2023-07-21 NOTE — Progress Notes (Signed)
Office Visit Note   Patient: [name]           Date of Birth: [birth year]           MRN: [id number] Visit Date: 07/20/2023              Requested by: Tracey Harries, MD [address] [place],  [place] [address] PCP: Tracey Harries, MD  Chief Complaint  Patient presents with   Left Leg - Follow-up      HPI: Patient is a 67 year old gentleman who is status post chronic wound left leg status post compartment syndrome.  Tissue graft most recently applied August 20.  Assessment & Plan: Visit Diagnoses:  1. Leg wound, left, subsequent encounter   2. Compartment syndrome of left lower extremity, subsequent encounter     Plan: Patient shows continued slow improvement in wound healing.  Continue with the compression sock.  Follow-Up Instructions: Return in about 2 months (around 09/19/2023).   Ortho Exam  Patient is alert, oriented, no adenopathy, well-dressed, normal affect, normal respiratory effort. Examination the wound has superficial epithelialization the wound measures 3 x 6 cm with healthy granulation tissue after debridement.  The wound is flat.  Imaging: No results found.   Labs: Lab Results  Component Value Date   HGBA1C 5.4 01/29/2022   HGBA1C 5.2 03/16/2019   REPTSTATUS 01/07/2022 FINAL 01/02/2022   REPTSTATUS 01/08/2022 FINAL 01/02/2022   GRAMSTAIN  01/02/2022    FEW WBC PRESENT,BOTH PMN AND MONONUCLEAR RARE GRAM POSITIVE COCCI RARE GRAM NEGATIVE RODS Performed at [facility], [address] [place], [place] [address]    GRAMSTAIN  01/02/2022    FEW WBC PRESENT,BOTH PMN AND MONONUCLEAR NO ORGANISMS SEEN    CULT  01/02/2022    FEW STREPTOCOCCUS GROUP C Beta hemolytic streptococci are predictably susceptible to penicillin and other beta lactams. Susceptibility testing not routinely performed. ABUNDANT KLEBSIELLA OXYTOCA ABUNDANT STENOTROPHOMONAS MALTOPHILIA    CULT  01/02/2022    NO ANAEROBES ISOLATED Performed at [facility], [address], [place], [place] [address]    LABORGA KLEBSIELLA OXYTOCA 01/02/2022   LABORGA STENOTROPHOMONAS MALTOPHILIA 01/02/2022     Lab Results  Component Value Date   ALBUMIN 4.0 01/01/2023   ALBUMIN 4.0 11/20/2022   ALBUMIN 2.5 (L) 01/30/2022    Lab Results  Component Value Date   MG 2.1 01/29/2022   MG 2.1 06/19/2019   MG 2.2 03/25/2019   No results found for: "VD25OH"  No results found for: "PREALBUMIN"    Latest Ref Rng & Units 06/29/2023    7:43 AM 12/11/2022   11:40 AM 11/20/2022   12:12 PM  CBC EXTENDED  WBC 3.4 - 10.8 x10E3/uL 4.3   6.7   RBC 4.14 - 5.80 x10E6/uL 4.58   4.80   Hemoglobin 13.0 - 17.7 g/dL 32.2  02.5  42.7   HCT 37.5 - 51.0 % 43.7  50.0  46.7   Platelets 150 - 450 x10E3/uL 184   187      There is no height or weight on file to calculate BMI.  Orders:  No orders of the defined types were placed in this encounter.  No orders of the defined types were placed in this encounter.    Procedures: No procedures performed  Clinical Data: No additional findings.  ROS:  All other systems negative, except as noted in the HPI. Review of Systems  Objective: Vital Signs: There were no vitals taken for  this visit.  Specialty Comments:  No specialty comments available.  PMFS History: Patient Active Problem List   Diagnosis Date Noted   Left ventricular dysfunction 10/13/2022   Rectal bleeding 01/30/2022   Abnormal finding on imaging 01/30/2022   GIB (gastrointestinal bleeding) 01/29/2022   Hyponatremia 01/29/2022   Hypocalcemia 01/29/2022   Alcohol abuse 01/29/2022   AKI (acute kidney injury) (HCC) 01/29/2022   Symptomatic anemia 01/29/2022   Venous stasis dermatitis of both lower extremities    Bleeding on Coumadin    Leg wound, left, subsequent encounter 12/30/2021   Compartment syndrome of left lower extremity (HCC) 09/22/2021   Status post surgery 09/22/2021   Compartment syndrome of left upper extremity (HCC)  09/22/2021   Hyperlipidemia 12/13/2020   Atrial fibrillation (HCC) 06/10/2020   Long term (current) use of anticoagulants 06/10/2020   Secondary hypercoagulable state (HCC) 04/24/2020   AV junctional bradycardia 03/22/2019   S/P minimally invasive mitral valve repair  03/21/2019   S/P Maze operation for atrial fibrillation 03/21/2019   Dilated aortic root (HCC) 01/04/2019   Dilated cardiomyopathy (HCC) 01/04/2019   Atrial flutter with rapid ventricular response (HCC)    Mitral valve prolapse    Hypertensive urgency 12/30/2018   Acute on chronic systolic (congestive) heart failure (HCC) 12/30/2018   Acute systolic heart failure (HCC) 12/30/2018   Chronic systolic (congestive) heart failure (HCC)    Persistent atrial fibrillation (HCC) 12/02/2018   Right hamstring muscle strain 07/20/2018   Essential hypertension 02/07/2016   Severe mitral regurgitation    Annual physical exam 11/16/2014   Arthritis of left knee 10/23/2014   Status post total left knee replacement 10/23/2014   Arthritis of knee, right 01/19/2014   Status post total knee replacement 01/19/2014   Benign paroxysmal positional vertigo 10/30/2013   Testicular hypofunction 03/24/2012   Past Medical History:  Diagnosis Date   Acute on chronic systolic (congestive) heart failure (HCC) 12/30/2018   Allergy    Arthritis    Atrial flutter with rapid ventricular response (HCC)    Chronic systolic (congestive) heart failure (HCC)    COVID 10/2021   mild   Dilated aortic root (HCC) 01/04/2019   Dilated cardiomyopathy (HCC) 01/04/2019   Dysrhythmia    Heart murmur    History of blood transfusion    01/27/22   History of colon polyps    Hypertension    Insomnia    Mitral valve prolapse    Mitral valve regurgitation    Persistent atrial fibrillation (HCC) 12/02/2018   S/P Maze operation for atrial fibrillation 03/21/2019   Complete bilateral atrial lesion set using cryothermy and bipolar radiofrequency ablation with  clipping of LA appendage via right mini thoracotomy approach   S/P minimally invasive mitral valve repair 03/21/2019   Complex valvuloplasty including triangular resection of posterior leaflet, artificial Gore-tex neochord placement x4 and 36 mm Sorin Memo 4D ring annuloplasty via right mini thoracotomy approach   Seizures (HCC)    had one approx. 30 yrs ago,has not had any since; due to alchol and no sleep   Severe mitral regurgitation     Family History  Problem Relation Age of Onset   Hypertension Father    Colon cancer Father        dx in his late 63's   Diabetes Mother    Stomach cancer Neg Hx    Esophageal cancer Neg Hx    Pancreatic cancer Neg Hx    Rectal cancer Neg Hx     Past Surgical History:  Procedure Laterality Date   ANKLE FUSION  left   Dec. 2012   APPLICATION OF WOUND VAC Left 09/22/2021   Procedure: APPLICATION OF WOUND VAC;  Surgeon: Kathryne Hitch, MD;  Location: [facility] OR;  Service: Orthopedics;  Laterality: Left;   APPLICATION OF WOUND VAC Left 12/30/2021   Procedure: APPLICATION OF WOUND VAC;  Surgeon: Kathryne Hitch, MD;  Location: [facility] OR;  Service: Orthopedics;  Laterality: Left;   APPLICATION OF WOUND VAC Left 04/10/2022   Procedure: APPLICATION OF WOUND VAC;  Surgeon: Nadara Mustard, MD;  Location: [facility] OR;  Service: Orthopedics;  Laterality: Left;   ARTHROSCOPY KNEE W/ DRILLING  bilateral   2012   ATRIAL FIBRILLATION ABLATION N/A 07/15/2023   Procedure: ATRIAL FIBRILLATION ABLATION;  Surgeon: Maurice Small, MD;  Location: [facility] INVASIVE CV LAB;  Service: Cardiovascular;  Laterality: N/A;   BIOPSY  01/30/2022   Procedure: BIOPSY;  Surgeon: Meridee Score Netty Starring., MD;  Location: [facility];  Service: Gastroenterology;;   CARDIAC VALVE REPLACEMENT     CARDIOVERSION N/A 01/03/2019   Procedure: CARDIOVERSION;  Surgeon: Quintella Reichert, MD;  Location: [facility] ENDOSCOPY;  Service: Cardiovascular;  Laterality: N/A;   CARDIOVERSION N/A 06/23/2019    Procedure: CARDIOVERSION;  Surgeon: Pricilla Riffle, MD;  Location: [facility] ENDOSCOPY;  Service: Cardiovascular;  Laterality: N/A;   CARDIOVERSION N/A 12/11/2022   Procedure: CARDIOVERSION;  Surgeon: Laurey Morale, MD;  Location: [facility] ENDOSCOPY;  Service: Cardiovascular;  Laterality: N/A;   CLIPPING OF ATRIAL APPENDAGE  03/21/2019   Procedure: Clipping Of Atrial Appendage using 45mm Atricure Pro2 Clip;  Surgeon: Purcell Nails, MD;  Location: [facility] OR;  Service: Open Heart Surgery;;   COLONOSCOPY     x2   COLONOSCOPY N/A 01/30/2022   Procedure: COLONOSCOPY;  Surgeon: Lemar Lofty., MD;  Location: [facility] ENDOSCOPY;  Service: Gastroenterology;  Laterality: N/A;   ESOPHAGOGASTRODUODENOSCOPY N/A 01/30/2022   Procedure: ESOPHAGOGASTRODUODENOSCOPY (EGD);  Surgeon: Lemar Lofty., MD;  Location: [facility];  Service: Gastroenterology;  Laterality: N/A;   FOOT ARTHRODESIS  06/23/2012   Procedure: ARTHRODESIS FOOT;  Surgeon: Toni Arthurs, MD;  Location: [facility] OR;  Service: Orthopedics;  Laterality: Left;  Left Subtalar and Talonavicular Joint Revision Arthrodesis  Aspiration of Bone Marrow from Left Hip    HAIR TRANSPLANT     HARDWARE REMOVAL  06/23/2012   Procedure: HARDWARE REMOVAL;  Surgeon: Toni Arthurs, MD;  Location: [facility] OR;  Service: Orthopedics;  Laterality: Left;  Removal of Deep Implant  X's 3   I & D EXTREMITY Left 09/23/2021   Procedure: IRRIGATION AND DEBRIDEMENT OF LEG;  Surgeon: Kathryne Hitch, MD;  Location: [facility] OR;  Service: Orthopedics;  Laterality: Left;   I & D EXTREMITY Left 09/26/2021   Procedure: REPEAT IRRIGATION AND DEBRIDEMENT LEFT LEG, POSSIBLE WOUND CLOSURE, POSSIBLE VAC CHANGE, POSSIBLE SKIN GRAFT;  Surgeon: Kathryne Hitch, MD;  Location: [facility] OR;  Service: Orthopedics;  Laterality: Left;   I & D EXTREMITY Left 12/30/2021   Procedure: IRRIGATION AND DEBRIDEMENT LEFT LOWER LEG WOUND;  Surgeon: Kathryne Hitch, MD;  Location: [facility] OR;  Service: Orthopedics;   Laterality: Left;   I & D EXTREMITY Left 01/02/2022   Procedure: LEFT LEG DEBRIDEMENT AND TISSUE GRAFT;  Surgeon: Nadara Mustard, MD;  Location: [facility] OR;  Service: Orthopedics;  Laterality: Left;   INCISION AND DRAINAGE WOUND WITH FASCIOTOMY Left 09/22/2021   Procedure: INCISION AND DRAINAGE WOUND WITH FASCIOTOMY;  Surgeon: Kathryne Hitch, MD;  Location: [facility]  OR;  Service: Orthopedics;  Laterality: Left;   INSERTION OF MESH N/A 07/10/2016   Procedure: INSERTION OF MESH;  Surgeon: Abigail Miyamoto, MD;  Location: [facility];  Service: General;  Laterality: N/A;   JOINT REPLACEMENT     right hip  01-2011   LEFT HEART CATH AND CORONARY ANGIOGRAPHY N/A 03/16/2019   Procedure: LEFT HEART CATH AND CORONARY ANGIOGRAPHY;  Surgeon: Tonny Bollman, MD;  Location: [facility] INVASIVE CV LAB;  Service: Cardiovascular;  Laterality: N/A;   LIMB SPARING RESECTION HIP W/ SADDLE JOINT REPLACEMENT Right    MINIMALLY INVASIVE MAZE PROCEDURE N/A 03/21/2019   Procedure: MINIMALLY INVASIVE MAZE PROCEDURE;  Surgeon: Purcell Nails, MD;  Location: [facility] OR;  Service: Open Heart Surgery;  Laterality: N/A;   MITRAL VALVE REPAIR Right 03/21/2019   Procedure: MINIMALLY INVASIVE MITRAL VALVE REPAIR (MVR) using Memo 4D ring size 36;  Surgeon: Purcell Nails, MD;  Location: [facility] OR;  Service: Open Heart Surgery;  Laterality: Right;   POLYPECTOMY  01/30/2022   Procedure: POLYPECTOMY;  Surgeon: Mansouraty, Netty Starring., MD;  Location: [facility];  Service: Gastroenterology;;   SKIN SPLIT GRAFT Left 04/10/2022   Procedure: APPLY SKIN GRAFT TO LEFT LEG WOUND;  Surgeon: Nadara Mustard, MD;  Location: [facility] OR;  Service: Orthopedics;  Laterality: Left;   TEE WITHOUT CARDIOVERSION N/A 01/03/2019   Procedure: TRANSESOPHAGEAL ECHOCARDIOGRAM (TEE);  Surgeon: Quintella Reichert, MD;  Location: [facility] ENDOSCOPY;  Service: Cardiovascular;  Laterality: N/A;   TEE WITHOUT CARDIOVERSION N/A 03/21/2019   Procedure: TRANSESOPHAGEAL ECHOCARDIOGRAM  (TEE);  Surgeon: Purcell Nails, MD;  Location: [facility] OR;  Service: Open Heart Surgery;  Laterality: N/A;   TEE WITHOUT CARDIOVERSION N/A 12/11/2022   Procedure: TRANSESOPHAGEAL ECHOCARDIOGRAM (TEE);  Surgeon: Laurey Morale, MD;  Location: [facility] ENDOSCOPY;  Service: Cardiovascular;  Laterality: N/A;   TEMPORARY PACEMAKER N/A 03/22/2019   Procedure: TEMPORARY PACEMAKER;  Surgeon: Lyn Records, MD;  Location: [facility] INVASIVE CV LAB;  Service: Cardiovascular;  Laterality: N/A;   TOTAL KNEE ARTHROPLASTY Right 01/19/2014   Procedure: RIGHT TOTAL KNEE ARTHROPLASTY, Steroid injection left knee;  Surgeon: Kathryne Hitch, MD;  Location: [facility] ORS;  Service: Orthopedics;  Laterality: Right;   TOTAL KNEE ARTHROPLASTY Left 10/23/2014   Procedure: LEFT TOTAL KNEE ARTHROPLASTY;  Surgeon: Kathryne Hitch, MD;  Location: [facility] ORS;  Service: Orthopedics;  Laterality: Left;   UMBILICAL HERNIA REPAIR N/A 07/10/2016   Procedure: UMBILICAL HERNIA REPAIR;  Surgeon: Abigail Miyamoto, MD;  Location: [facility];  Service: General;  Laterality: N/A;   Social History   Occupational History   Occupation: Teaching laboratory technician  Tobacco Use   Smoking status: Never   Smokeless tobacco: Never  Vaping Use   Vaping status: Never Used  Substance and Sexual Activity   Alcohol use: Not Currently    Alcohol/week: 21.0 standard drinks of alcohol    Types: 21 Shots of liquor per week   Drug use: No   Sexual activity: Yes

## 2023-07-21 NOTE — Patient Instructions (Signed)
Description   Continue taking warfarin 1 tablet daily except 1.5 tablets on Wednesdays and Fridays.   Recheck INR in 4 weeks. [facility] [phone].

## 2023-07-29 ENCOUNTER — Other Ambulatory Visit (HOSPITAL_COMMUNITY): Payer: Self-pay

## 2023-07-29 MED ORDER — SPIRONOLACTONE 25 MG PO TABS: 25.0000 mg | ORAL_TABLET | Freq: Every day | ORAL | 6 refills | Status: DC

## 2023-08-12 ENCOUNTER — Ambulatory Visit (HOSPITAL_COMMUNITY)
Admission: RE | Admit: 2023-08-12 | Discharge: 2023-08-12 | Disposition: A | Discharge status: Home / Self Care | Payer: Medicare HMO | Source: Ambulatory Visit | Attending: Internal Medicine | Admitting: Internal Medicine

## 2023-08-12 VITALS — BP 124/72 | HR 79 | Ht 75.0 in | Wt 262.0 lb

## 2023-08-12 DIAGNOSIS — I340 Nonrheumatic mitral (valve) insufficiency: Secondary | ICD-10-CM | POA: Diagnosis not present

## 2023-08-12 DIAGNOSIS — D6869 Other thrombophilia: Secondary | ICD-10-CM | POA: Diagnosis not present

## 2023-08-12 DIAGNOSIS — I5022 Chronic systolic (congestive) heart failure: Secondary | ICD-10-CM | POA: Insufficient documentation

## 2023-08-12 DIAGNOSIS — I4892 Unspecified atrial flutter: Secondary | ICD-10-CM | POA: Diagnosis not present

## 2023-08-12 DIAGNOSIS — Z7901 Long term (current) use of anticoagulants: Secondary | ICD-10-CM | POA: Insufficient documentation

## 2023-08-12 DIAGNOSIS — E669 Obesity, unspecified: Secondary | ICD-10-CM | POA: Diagnosis not present

## 2023-08-12 DIAGNOSIS — I110 Hypertensive heart disease with heart failure: Secondary | ICD-10-CM | POA: Insufficient documentation

## 2023-08-12 DIAGNOSIS — M1990 Unspecified osteoarthritis, unspecified site: Secondary | ICD-10-CM | POA: Diagnosis not present

## 2023-08-12 DIAGNOSIS — Z6832 Body mass index (BMI) 32.0-32.9, adult: Secondary | ICD-10-CM | POA: Diagnosis not present

## 2023-08-12 DIAGNOSIS — I4819 Other persistent atrial fibrillation: Secondary | ICD-10-CM | POA: Insufficient documentation

## 2023-08-12 DIAGNOSIS — Z7182 Exercise counseling: Secondary | ICD-10-CM | POA: Insufficient documentation

## 2023-08-12 NOTE — Progress Notes (Signed)
Primary Care Physician: Tracey Harries, MD Primary Cardiologist: Dr Allyson Sabal Primary Electrophysiologist: none Referring Physician: Dr Orvilla Cornwall is a 67 y.o. male with a history of mitral valve prolapse and severe mitral regurgitation, persistent atrial fibrillation and atrial flutter, chronic combined systolic and diastoliccongestive heart failure, hypertension, and degenerative arthritis who presents for follow up in the [facility]. Patient underwent minimally invasive MVR and MAZE with Dr Cornelius Moras 03/2019. He did have some post operative afib and was maintained on amiodarone. Patient is on warfarin for a CHADS2VASC score of 3.   On follow up today, patient reports that he has done well from a cardiac standpoint. He denies any heart racing or palpitations. His primary concern today is the cost of his medications.   On evaluation today, he is currently in NSR. He does not have cardiac awareness of Afib. S/p Afib ablation on 07/15/23 by Dr. Nelly Laurence. Amiodarone was held prior to ablation for purposes of mapping. No episodes of Afib since ablation. No chest pain, SOB, or trouble swallowing. Leg sites healed without issue. No missed doses of anticoagulant.  Today, he denies symptoms of orthopnea, PND, lower extremity edema, dizziness, presyncope, syncope, snoring, daytime somnolence, bleeding, or neurologic sequela. The patient is tolerating medications without difficulties and is otherwise without complaint today.    Atrial Fibrillation Risk Factors:  he does not have symptoms or diagnosis of sleep apnea. he does not have a history of rheumatic fever.   he has a BMI of Body mass index is 32.75 kg/m.Marland Kitchen Filed Weights   08/12/23 1348  Weight: 118.8 kg     Family History  Problem Relation Age of Onset   Hypertension Father    Colon cancer Father        dx in his late 40's   Diabetes Mother    Stomach cancer Neg Hx    Esophageal cancer Neg Hx     Pancreatic cancer Neg Hx    Rectal cancer Neg Hx    Atrial Fibrillation Management history:  Previous antiarrhythmic drugs: amiodarone Previous cardioversions: 01/2019, 06/2019 Previous ablations: none, MAZE 03/2019, 07/15/23 CHADS2VASC score: 3 Anticoagulation history: Eliquis, warfarin, Xarelto   Past Medical History:  Diagnosis Date   Acute on chronic systolic (congestive) heart failure (HCC) 12/30/2018   Allergy    Arthritis    Atrial flutter with rapid ventricular response (HCC)    Chronic systolic (congestive) heart failure (HCC)    COVID 10/2021   mild   Dilated aortic root (HCC) 01/04/2019   Dilated cardiomyopathy (HCC) 01/04/2019   Dysrhythmia    Heart murmur    History of blood transfusion    01/27/22   History of colon polyps    Hypertension    Insomnia    Mitral valve prolapse    Mitral valve regurgitation    Persistent atrial fibrillation (HCC) 12/02/2018   S/P Maze operation for atrial fibrillation 03/21/2019   Complete bilateral atrial lesion set using cryothermy and bipolar radiofrequency ablation with clipping of LA appendage via right mini thoracotomy approach   S/P minimally invasive mitral valve repair 03/21/2019   Complex valvuloplasty including triangular resection of posterior leaflet, artificial Gore-tex neochord placement x4 and 36 mm Sorin Memo 4D ring annuloplasty via right mini thoracotomy approach   Seizures (HCC)    had one approx. 30 yrs ago,has not had any since; due to alchol and no sleep   Severe mitral regurgitation    Past Surgical History:  Procedure  Laterality Date   ANKLE FUSION  left   Dec. 2012   APPLICATION OF WOUND VAC Left 09/22/2021   Procedure: APPLICATION OF WOUND VAC;  Surgeon: Kathryne Hitch, MD;  Location: [facility] OR;  Service: Orthopedics;  Laterality: Left;   APPLICATION OF WOUND VAC Left 12/30/2021   Procedure: APPLICATION OF WOUND VAC;  Surgeon: Kathryne Hitch, MD;  Location: [facility] OR;  Service: Orthopedics;   Laterality: Left;   APPLICATION OF WOUND VAC Left 04/10/2022   Procedure: APPLICATION OF WOUND VAC;  Surgeon: Nadara Mustard, MD;  Location: [facility] OR;  Service: Orthopedics;  Laterality: Left;   ARTHROSCOPY KNEE W/ DRILLING  bilateral   2012   ATRIAL FIBRILLATION ABLATION N/A 07/15/2023   Procedure: ATRIAL FIBRILLATION ABLATION;  Surgeon: Maurice Small, MD;  Location: [facility] INVASIVE CV LAB;  Service: Cardiovascular;  Laterality: N/A;   BIOPSY  01/30/2022   Procedure: BIOPSY;  Surgeon: Meridee Score Netty Starring., MD;  Location: [facility]COPY;  Service: Gastroenterology;;   CARDIAC VALVE REPLACEMENT     CARDIOVERSION N/A 01/03/2019   Procedure: CARDIOVERSION;  Surgeon: Quintella Reichert, MD;  Location: [facility] ENDOSCOPY;  Service: Cardiovascular;  Laterality: N/A;   CARDIOVERSION N/A 06/23/2019   Procedure: CARDIOVERSION;  Surgeon: Pricilla Riffle, MD;  Location: [facility] ENDOSCOPY;  Service: Cardiovascular;  Laterality: N/A;   CARDIOVERSION N/A 12/11/2022   Procedure: CARDIOVERSION;  Surgeon: Laurey Morale, MD;  Location[facility] END[facility]COPY;  Service: Cardiovascular;  Laterality: N/A;   CLIPPING OF ATRIAL APPENDAGE  03/21/2019   Procedure: Clipping Of Atrial Appendage using 45mm Atricure Pro2 Clip;  Surgeon: Purcell Nails, MD;  Location: [facility] OR;  Service: Open Heart Surgery;;   COLONOSCOPY     x2   COLONOSCOPY N/A 01/30/2022   Procedure: COLONOSCOPY;  Surgeon: Lemar Lofty., MD;  Location: [facility] ENDOSCOPY;  Service: Gastroenterology;  Laterality: N/A;   ESOPHAGOGASTRODUODENOSCOPY N/A 01/30/2022   Procedure: ESOPHAGOGASTRODUODENOSCOPY (EGD);  Surgeon: Lemar Lofty., MD;  Location: [facility] ENDOSCOPY;  Service: Gastroenterology;  Laterality: N/A;   FOOT ARTHRODESIS  06/23/2012   Procedure: ARTHRODESIS FOOT;  Surgeon: Toni Arthurs, MD;  Location: [facility] OR;  Service: Orthopedics;  Laterality: Left;  Left Subtalar and Talonavicular Joint Revision Arthrodesis  Aspiration of Bone Marrow from Left Hip    HAIR TRANSPLANT      HARDWARE REMOVAL  06/23/2012   Procedure: HARDWARE REMOVAL;  Surgeon: Toni Arthurs, MD;  Location: [facility] OR;  Service: Orthopedics;  Laterality: Left;  Removal of Deep Implant  X's 3   I & D EXTREMITY Left 09/23/2021   Procedure: IRRIGATION AND DEBRIDEMENT OF LEG;  Surgeon: Kathryne Hitch, MD;  Location: [facility] OR;  Service: Orthopedics;  Laterality: Left;   I & D EXTREMITY Left 09/26/2021   Procedure: REPEAT IRRIGATION AND DEBRIDEMENT LEFT LEG, POSSIBLE WOUND CLOSURE, POSSIBLE VAC CHANGE, POSSIBLE SKIN GRAFT;  Surgeon: Kathryne Hitch, MD;  Location: [facility] OR;  Service: Orthopedics;  Laterality: Left;   I & D EXTREMITY Left 12/30/2021   Procedure: IRRIGATION AND DEBRIDEMENT LEFT LOWER LEG WOUND;  Surgeon: Kathryne Hitch, MD;  Location: [facility] OR;  Service: Orthopedics;  Laterality: Left;   I & D EXTREMITY Left 01/02/2022   Procedure: LEFT LEG DEBRIDEMENT AND TISSUE GRAFT;  Surgeon: Nadara Mustard, MD;  Location: [facility] OR;  Service: Orthopedics;  Laterality: Left;   INCISION AND DRAINAGE WOUND WITH FASCIOTOMY Left 09/22/2021   Procedure: INCISION AND DRAINAGE WOUND WITH FASCIOTOMY;  Surgeon: Kathryne Hitch, MD;  Location: [facility] OR;  Service: Orthopedics;  Laterality: Left;   INSERTION OF MESH N/A 07/10/2016   Procedure: INSERTION OF MESH;  Surgeon: Abigail Miyamoto, MD;  Location: [facility];  Service: General;  Laterality: N/A;   JOINT REPLACEMENT     right hip  01-2011   LEFT HEART CATH AND CORONARY ANGIOGRAPHY N/A 03/16/2019   Procedure: LEFT HEART CATH AND CORONARY ANGIOGRAPHY;  Surgeon: Tonny Bollman, MD;  Location: [facility] INVASIVE CV LAB;  Service: Cardiovascular;  Laterality: N/A;   LIMB SPARING RESECTION HIP W/ SADDLE JOINT REPLACEMENT Right    MINIMALLY INVASIVE MAZE PROCEDURE N/A 03/21/2019   Procedure: MINIMALLY INVASIVE MAZE PROCEDURE;  Surgeon: Purcell Nails, MD;  Location: [facility] OR;  Service: Open Heart Surgery;  Laterality: N/A;   MITRAL VALVE REPAIR Right  03/21/2019   Procedure: MINIMALLY INVASIVE MITRAL VALVE REPAIR (MVR) using Memo 4D ring size 36;  Surgeon: Purcell Nails, MD;  Location: [facility] OR;  Service: Open Heart Surgery;  Laterality: Right;   POLYPECTOMY  01/30/2022   Procedure: POLYPECTOMY;  Surgeon: Mansouraty, Netty Starring., MD;  Location: [facility];  Service: Gastroenterology;;   SKIN SPLIT GRAFT Left 04/10/2022   Procedure: APPLY SKIN GRAFT TO LEFT LEG WOUND;  Surgeon: Nadara Mustard, MD;  Location: [facility] OR;  Service: Orthopedics;  Laterality: Left;   TEE WITHOUT CARDIOVERSION N/A 01/03/2019   Procedure: TRANSESOPHAGEAL ECHOCARDIOGRAM (TEE);  Surgeon: Quintella Reichert, MD;  Location: [facility] ENDOSCOPY;  Service: Cardiovascular;  Laterality: N/A;   TEE WITHOUT CARDIOVERSION N/A 03/21/2019   Procedure: TRANSESOPHAGEAL ECHOCARDIOGRAM (TEE);  Surgeon: Purcell Nails, MD;  Location: [facility] OR;  Service: Open Heart Surgery;  Laterality: N/A;   TEE WITHOUT CARDIOVERSION N/A 12/11/2022   Procedure: TRANSESOPHAGEAL ECHOCARDIOGRAM (TEE);  Surgeon: Laurey Morale, MD;  Location: [facility] ENDOSCOPY;  Service: Cardiovascular;  Laterality: N/A;   TEMPORARY PACEMAKER N/A 03/22/2019   Procedure: TEMPORARY PACEMAKER;  Surgeon: Lyn Records, MD;  Location: [facility] INVASIVE CV LAB;  Service: Cardiovascular;  Laterality: N/A;   TOTAL KNEE ARTHROPLASTY Right 01/19/2014   Procedure: RIGHT TOTAL KNEE ARTHROPLASTY, Steroid injection left knee;  Surgeon: Kathryne Hitch, MD;  Location: [facility] ORS;  Service: Orthopedics;  Laterality: Right;   TOTAL KNEE ARTHROPLASTY Left 10/23/2014   Procedure: LEFT TOTAL KNEE ARTHROPLASTY;  Surgeon: Kathryne Hitch, MD;  Location: [facility] ORS;  Service: Orthopedics;  Laterality: Left;   UMBILICAL HERNIA REPAIR N/A 07/10/2016   Procedure: UMBILICAL HERNIA REPAIR;  Surgeon: Abigail Miyamoto, MD;  Location: [facility];  Service: General;  Laterality: N/A;    Current Outpatient Medications  Medication Sig Dispense Refill   ALPRAZolam  (XANAX) 0.5 MG tablet Take 0.5 mg by mouth 2 (two) times daily.     atorvastatin (LIPITOR) 20 MG tablet Take 20 mg by mouth every morning.     bismuth subsalicylate (PEPTO BISMOL) 262 MG/15ML suspension Take 30 mLs by mouth every 6 (six) hours as needed for indigestion or diarrhea or loose stools.     Docusate Sodium (DSS) 100 MG CAPS Take 3 capsules by mouth every morning.     meclizine (ANTIVERT) 25 MG tablet Take 50 mg by mouth daily as needed for dizziness.     Melatonin 10 MG TABS Take 10 mg by mouth at bedtime.     metoprolol tartrate (LOPRESSOR) 25 MG tablet Take 25 mg by mouth daily.     Multiple Vitamin (MULTIVITAMIN WITH MINERALS) TABS tablet Take 1 tablet by mouth every morning.     OVER THE  COUNTER MEDICATION Take 1 capsule by mouth daily. Brilliant Brain supplement     oxyCODONE-acetaminophen (PERCOCET) 10-325 MG tablet Take 1-1.5 tablets by mouth every 8 (eight) hours as needed for pain.     sacubitril-valsartan (ENTRESTO) 49-51 MG Take 1 tablet by mouth 2 (two) times daily. 60 tablet 11   silver sulfADIAZINE (SILVADENE) 1 % cream Apply 1 Application topically daily. Apply to affected area daily plus dry dressing (Patient taking differently: Apply 1 Application topically every other day.) 400 g 3   spironolactone (ALDACTONE) 25 MG tablet Take 1 tablet (25 mg total) by mouth daily. 30 tablet 6   torsemide (DEMADEX) 20 MG tablet TAKE 1 TABLET EVERY DAY 90 tablet 3   warfarin (COUMADIN) 5 MG tablet TAKE 1 TABLET EVERY DAY EXCEPT TAKE 1 AND 1/2 TABLETS ON WEDNESDAY AS INSTRUCTED (Patient taking differently: TAKE 1 TABLET EVERY DAY EXCEPT TAKE 1 AND 1/2 TABLETS ON WEDNESDAY AND FRIDAYS) 100 tablet 0   No current facility-administered medications for this encounter.    No Known Allergies  ROS- All systems are reviewed and negative except as per the HPI above.  Physical Exam: Vitals:   08/12/23 1348  BP: 124/72  Pulse: 79  Weight: 118.8 kg  Height: 6\' 3"  (1.905 m)    GEN- The  patient is well appearing, alert and oriented x 3 today.   Neck - no JVD or carotid bruit noted Lungs- Clear to ausculation bilaterally, normal work of breathing Heart- Regular rate and rhythm, no murmurs, rubs or gallops, PMI not laterally displaced Extremities- no clubbing, cyanosis, or edema Skin - no rash or ecchymosis noted   Wt Readings from Last 3 Encounters:  08/12/23 118.8 kg  07/15/23 115.7 kg  06/29/23 118.8 kg    EKG- Vent. rate 79 BPM PR interval 208 ms QRS duration 162 ms QT/QTcB 444/509 ms P-R-T axes 2 -72 28 Normal sinus rhythm Left axis deviation Right bundle branch block Abnormal ECG When compared with ECG of 15-Jul-2023 10:20, PREVIOUS ECG IS PRESENT  Echo TEE 12/11/22 demonstrated: 1. Left ventricular ejection fraction, by estimation, is 40 to 45%. The  left ventricle has mildly decreased function. The left ventricle  demonstrates global hypokinesis. There is mild concentric left ventricular  hypertrophy.   2. Right ventricular systolic function is mildly reduced. The right  ventricular size is normal.   3. The atrial appendage has been surgically clipped, no thrombus in  residual appendage or in LA. Left atrial size was mildly dilated.   4. The mitral valve has been repaired. Mild mitral valve regurgitation.  No evidence of mitral stenosis. The mean mitral valve gradient is 2.0  mmHg. There is a prosthetic annuloplasty ring present in the mitral  position. Procedure Date: 03/21/19.   5. The aortic valve is tricuspid. Aortic valve regurgitation is not  visualized. No aortic stenosis is present.   6. No ASD or PFO by color doppler.     Epic records are reviewed at length today  CHA2DS2-VASc Score =   3 The patient's score is based upon:        ASSESSMENT AND PLAN: 1. Persistent Atrial Fibrillation (ICD10:  I48.19) The patient's CHA2DS2-VASc score is 3 , indicating a  3.2% annual risk of stroke.   S/p MAZE 03/2019. S/p Afib ablation on 07/15/23  by Dr. Nelly Laurence.  Patient appears to be maintaining SR. He is not on amiodarone.  2. Secondary Hypercoagulable State (ICD10:  D68.69) The patient is at significant risk for stroke/thromboembolism based upon  his CHA2DS2-VASc Score of3  .  Continue coumadin as directed.   3. Obesity Body mass index is 32.75 kg/m. Lifestyle modification was discussed and encouraged including regular physical activity and weight reduction.  4. Mitral valve regurgitation S/p minimally invasive repair 03/2019.  5. HTN Stable, no change today.   Follow up as scheduled with Dr. Nelly Laurence    Justin Mend, PA-C Afib [facility] [address] [place], [place] [address] [phone] 08/12/2023 2:20 PM

## 2023-08-18 ENCOUNTER — Ambulatory Visit: Payer: Medicare HMO | Attending: Cardiovascular Disease

## 2023-08-18 DIAGNOSIS — Z5181 Encounter for therapeutic drug level monitoring: Secondary | ICD-10-CM

## 2023-08-18 DIAGNOSIS — I4819 Other persistent atrial fibrillation: Secondary | ICD-10-CM

## 2023-08-18 DIAGNOSIS — I4892 Unspecified atrial flutter: Secondary | ICD-10-CM | POA: Diagnosis not present

## 2023-08-18 LAB — POCT INR: INR: 2 (ref 2.0–3.0)

## 2023-08-18 NOTE — Patient Instructions (Signed)
Description   Take 2 tablets today and then continue taking warfarin 1 tablet daily except 1.5 tablets on Wednesdays and Fridays.   Recheck INR in 7 weeks.  Coumadin Clinic [phone].

## 2023-08-25 IMAGING — CT CT HEAD W/O CM
3 series · 14 of 47 positions shown, 16 images · non-contrast
Comparison: 08/12/2019

CLINICAL DATA: Fall. Trauma. Evaluate for intracranial hemorrhage.

EXAM:
CT HEAD WITHOUT CONTRAST
TECHNIQUE: Contiguous axial images were obtained from the base of the skull
through the vertex without intravenous contrast.

[Series 2: head wo · axial · 0.47mm/px · z∈[-174,-49]mm · 8 of 30 slices shown, 10 images]
[im 3/30  brain]
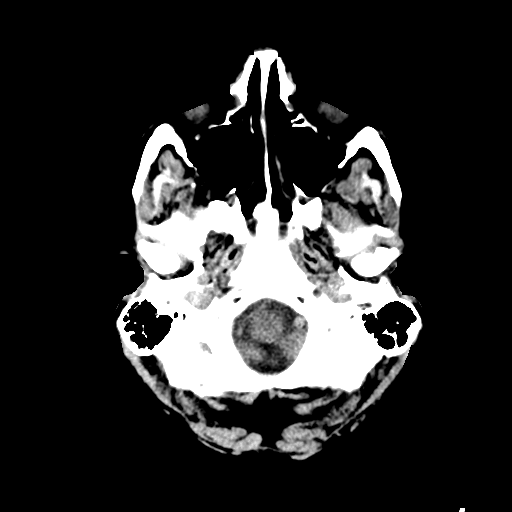
[im 3/30  bone]
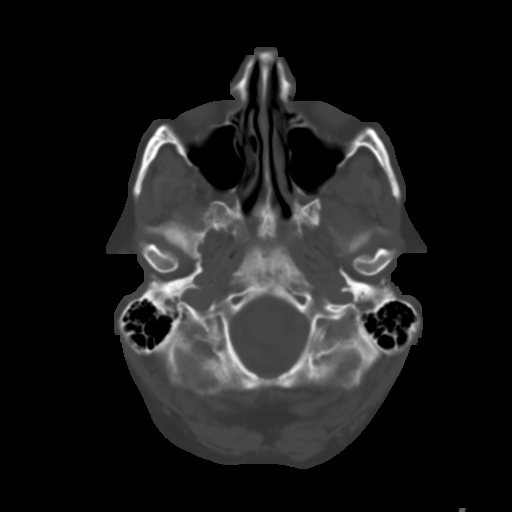
[im 7/30  brain]
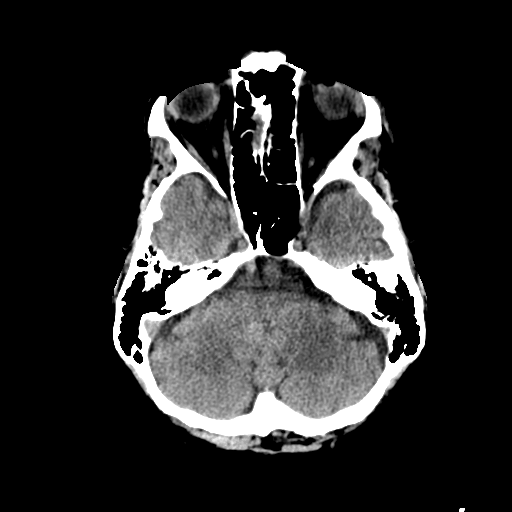
[im 10/30  brain]
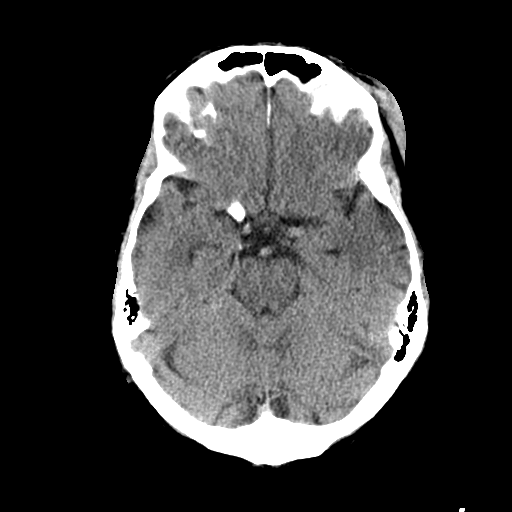
[im 14/30  brain]
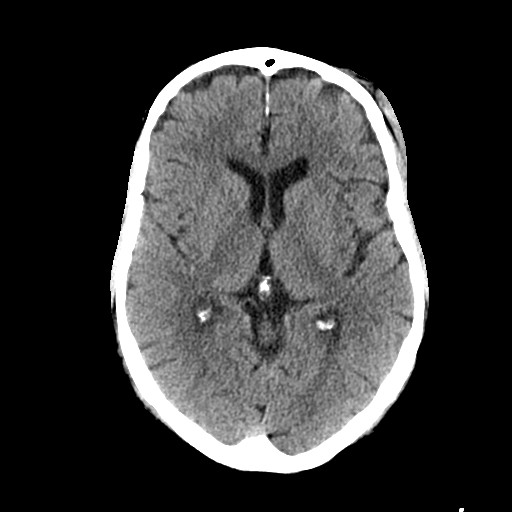
[im 17/30  brain]
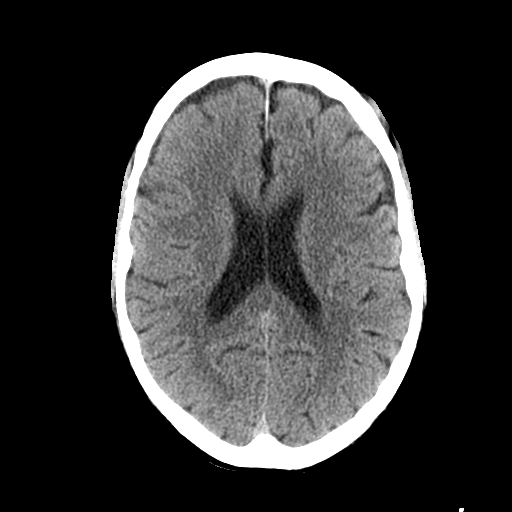
[im 17/30  bone]
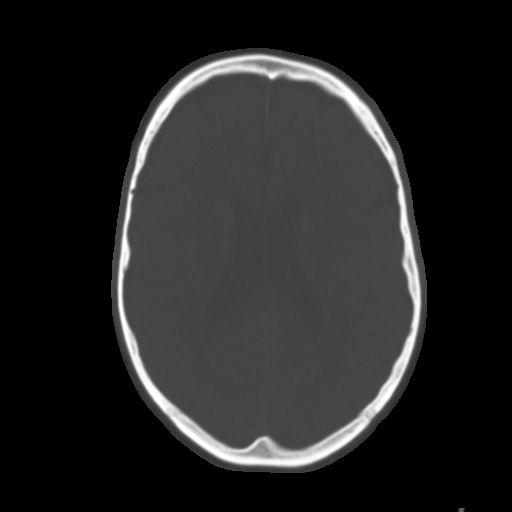
[im 21/30  brain]
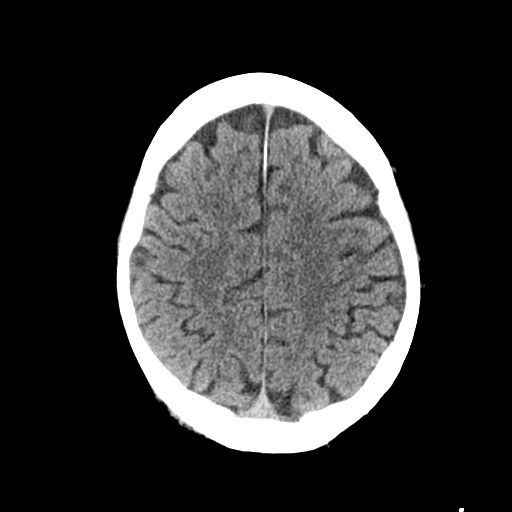
[im 24/30  brain]
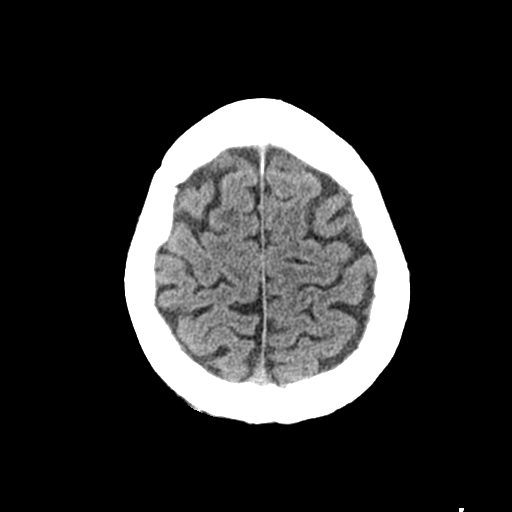
[im 28/30  brain]
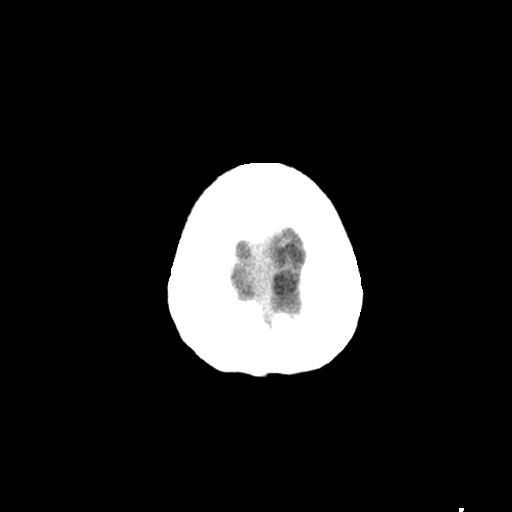

[Series 5: coronal soft tissue · coronal · 0.30mm/px · 3 of 74 slices shown]
[im 25/74  brain]
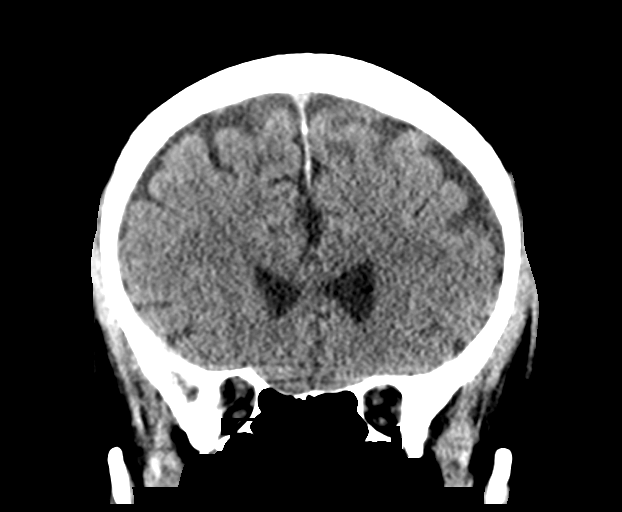
[im 33/74  brain]
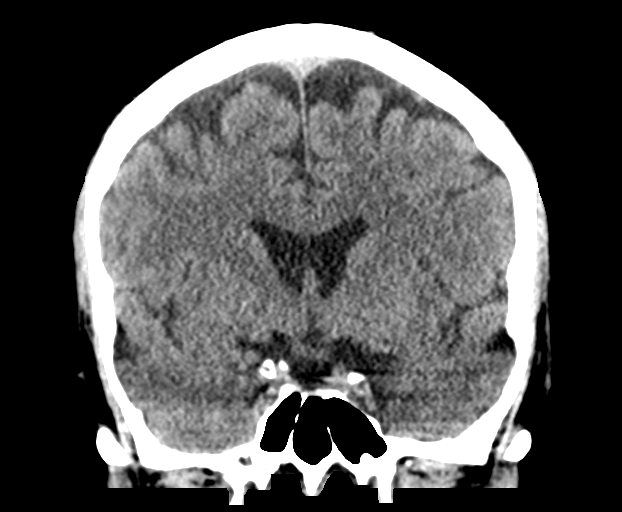
[im 41/74  brain]
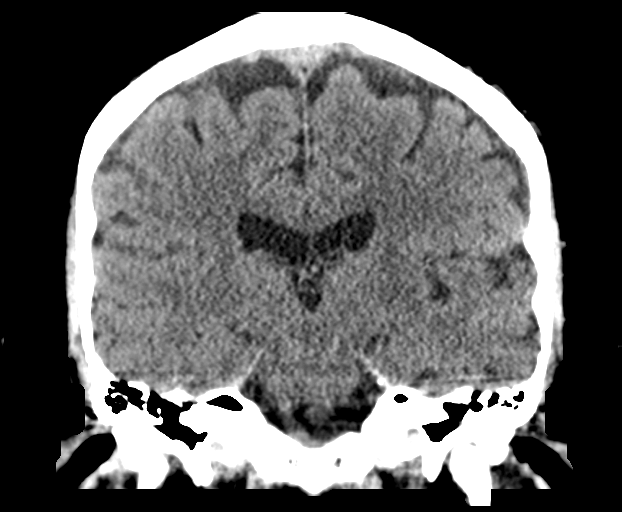

[Series 6: sagittal soft tissue · sagittal · 0.29mm/px · 3 of 64 slices shown]
[im 22/64  brain]
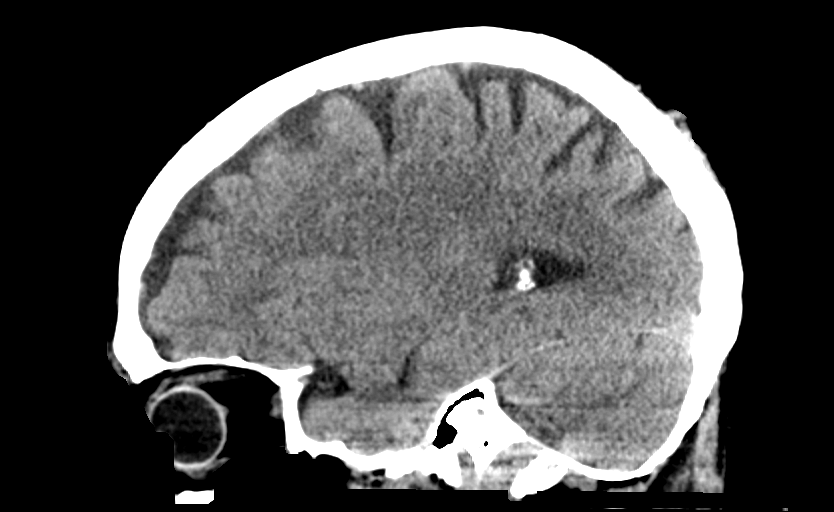
[im 32/64  brain]
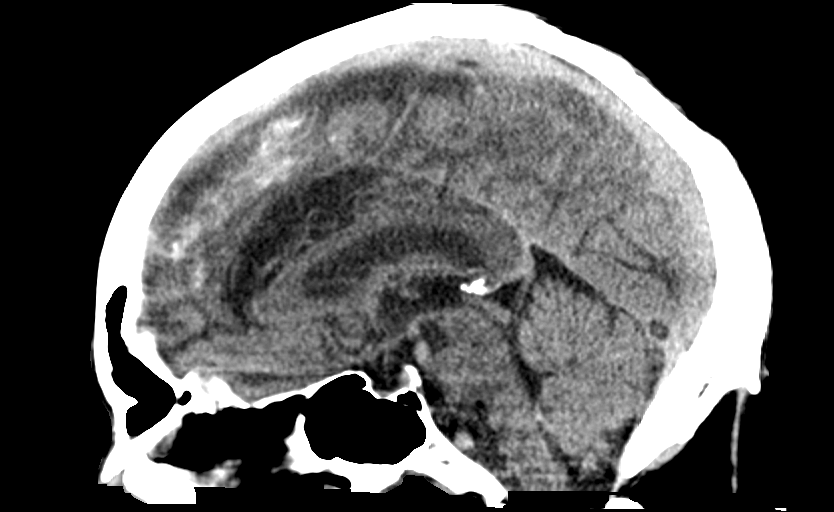
[im 43/64  brain]
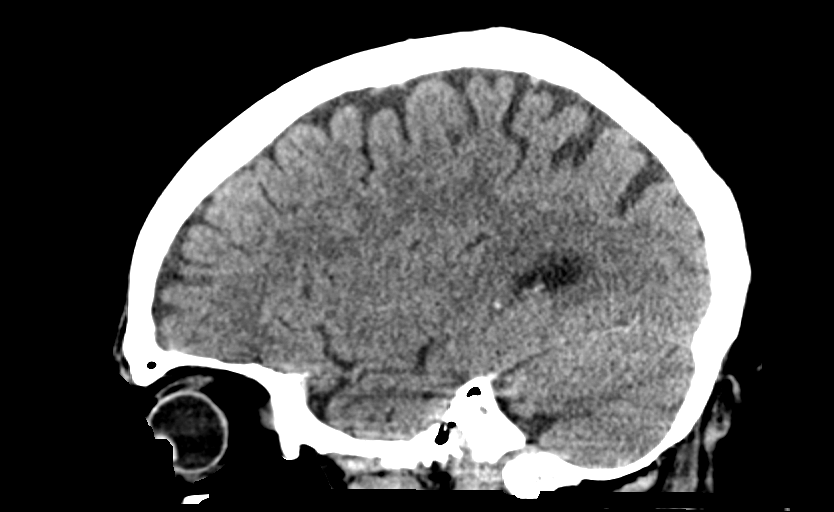

[14 of 47 positions shown; findings below may reference images not displayed]

FINDINGS: Brain: Mild cerebral atrophy for age with resultant prominence of
the extra-axial spaces adjacent the frontal lobes. Cerebellar
atrophy is also age advanced. No mass lesion, hemorrhage,
hydrocephalus, acute infarct, intra-axial, or extra-axial fluid
collection.

Vascular: No hyperdense vessel or unexpected calcification.

Skull: Left frontal scalp soft tissue swelling is moderate. No skull
fracture.

Sinuses/Orbits: Normal imaged portions of the orbits and globes.
Clear paranasal sinuses and mastoid air cells.

Other: None.
IMPRESSION: 1. No acute intracranial abnormality.
2. Left frontal scalp soft tissue swelling, without acute fracture.
3. Cerebral and cerebellar atrophy.

## 2023-09-16 ENCOUNTER — Ambulatory Visit: Payer: Medicare HMO | Admitting: Orthopedic Surgery

## 2023-09-23 ENCOUNTER — Ambulatory Visit: Payer: Medicare HMO | Admitting: Orthopedic Surgery

## 2023-09-23 DIAGNOSIS — S81802D Unspecified open wound, left lower leg, subsequent encounter: Secondary | ICD-10-CM

## 2023-10-04 ENCOUNTER — Encounter: Payer: Self-pay | Admitting: Orthopedic Surgery

## 2023-10-04 NOTE — Progress Notes (Signed)
Office Visit Note   Patient: [name]           Date of Birth: [birth year]           MRN: [id number] Visit Date: 09/23/2023              Requested by: Tracey Harries, MD [address] [place],  [place] [address] PCP: Tracey Harries, MD  Chief Complaint  Patient presents with   Left Leg - Follow-up      HPI: Patient is a 67 year old gentleman who presents in follow-up for left leg wound status post compartment release.  Patient is 2 years out from his compartment syndrome.  Assessment & Plan: Visit Diagnoses:  1. Leg wound, left, subsequent encounter     Plan: Samples of Kerecis micro was applied.  Follow-Up Instructions: Return in about 4 weeks (around 10/21/2023).   Ortho Exam  Patient is alert, oriented, no adenopathy, well-dressed, normal affect, normal respiratory effort. Examination the wound continues to improve.  There is still venous and lymphatic swelling.  The wound bed has healthy granulation tissue this measures 2.5 x 5 cm.  This was debrided back to flat bleeding granulation tissue and Kerecis micro graft was applied.  Imaging: No results found.   Labs: Lab Results  Component Value Date   HGBA1C 5.4 01/29/2022   HGBA1C 5.2 03/16/2019   REPTSTATUS 01/07/2022 FINAL 01/02/2022   REPTSTATUS 01/08/2022 FINAL 01/02/2022   GRAMSTAIN  01/02/2022    FEW WBC PRESENT,BOTH PMN AND MONONUCLEAR RARE GRAM POSITIVE COCCI RARE GRAM NEGATIVE RODS Performed at [facility], [address], [place], [place] [address]    GRAMSTAIN  01/02/2022    FEW WBC PRESENT,BOTH PMN AND MONONUCLEAR NO ORGANISMS SEEN    CULT  01/02/2022    FEW STREPTOCOCCUS GROUP C Beta hemolytic streptococci are predictably susceptible to penicillin and other beta lactams. Susceptibility testing not routinely performed. ABUNDANT KLEBSIELLA OXYTOCA ABUNDANT STENOTROPHOMONAS MALTOPHILIA    CULT  01/02/2022    NO ANAEROBES ISOLATED Performed at [facility]  Lab, [address], [place], [place] [address]    LABORGA KLEBSIELLA OXYTOCA 01/02/2022   LABORGA STENOTROPHOMONAS MALTOPHILIA 01/02/2022     Lab Results  Component Value Date   ALBUMIN 4.0 01/01/2023   ALBUMIN 4.0 11/20/2022   ALBUMIN 2.5 (L) 01/30/2022    Lab Results  Component Value Date   MG 2.1 01/29/2022   MG 2.1 06/19/2019   MG 2.2 03/25/2019   No results found for: "VD25OH"  No results found for: "PREALBUMIN"    Latest Ref Rng & Units 06/29/2023    7:43 AM 12/11/2022   11:40 AM 11/20/2022   12:12 PM  CBC EXTENDED  WBC 3.4 - 10.8 x10E3/uL 4.3   6.7   RBC 4.14 - 5.80 x10E6/uL 4.58   4.80   Hemoglobin 13.0 - 17.7 g/dL 86.5  78.4  69.6   HCT 37.5 - 51.0 % 43.7  50.0  46.7   Platelets 150 - 450 x10E3/uL 184   187      There is no height or weight on file to calculate BMI.  Orders:  No orders of the defined types were placed in this encounter.  No orders of the defined types were placed in this encounter.    Procedures: No procedures performed  Clinical Data: No additional findings.  ROS:  All other systems negative, except as noted in the HPI. Review of Systems  Objective: Vital Signs: There were no  vitals taken for this visit.  Specialty Comments:  No specialty comments available.  PMFS History: Patient Active Problem List   Diagnosis Date Noted   Left ventricular dysfunction 10/13/2022   Rectal bleeding 01/30/2022   Abnormal finding on imaging 01/30/2022   GIB (gastrointestinal bleeding) 01/29/2022   Hyponatremia 01/29/2022   Hypocalcemia 01/29/2022   Alcohol abuse 01/29/2022   AKI (acute kidney injury) (HCC) 01/29/2022   Symptomatic anemia 01/29/2022   Venous stasis dermatitis of both lower extremities    Bleeding on Coumadin    Leg wound, left, subsequent encounter 12/30/2021   Compartment syndrome of left lower extremity (HCC) 09/22/2021   Status post surgery 09/22/2021   Compartment syndrome of left upper extremity (HCC) 09/22/2021    Hyperlipidemia 12/13/2020   Atrial fibrillation (HCC) 06/10/2020   Long term (current) use of anticoagulants 06/10/2020   Secondary hypercoagulable state (HCC) 04/24/2020   AV junctional bradycardia 03/22/2019   S/P minimally invasive mitral valve repair  03/21/2019   S/P Maze operation for atrial fibrillation 03/21/2019   Dilated aortic root (HCC) 01/04/2019   Dilated cardiomyopathy (HCC) 01/04/2019   Atrial flutter with rapid ventricular response (HCC)    Mitral valve prolapse    Hypertensive urgency 12/30/2018   Acute on chronic systolic (congestive) heart failure (HCC) 12/30/2018   Acute systolic heart failure (HCC) 12/30/2018   Chronic systolic (congestive) heart failure (HCC)    Persistent atrial fibrillation (HCC) 12/02/2018   Right hamstring muscle strain 07/20/2018   Essential hypertension 02/07/2016   Severe mitral regurgitation    Annual physical exam 11/16/2014   Arthritis of left knee 10/23/2014   Status post total left knee replacement 10/23/2014   Arthritis of knee, right 01/19/2014   Status post total knee replacement 01/19/2014   Benign paroxysmal positional vertigo 10/30/2013   Testicular hypofunction 03/24/2012   Past Medical History:  Diagnosis Date   Acute on chronic systolic (congestive) heart failure (HCC) 12/30/2018   Allergy    Arthritis    Atrial flutter with rapid ventricular response (HCC)    Chronic systolic (congestive) heart failure (HCC)    COVID 10/2021   mild   Dilated aortic root (HCC) 01/04/2019   Dilated cardiomyopathy (HCC) 01/04/2019   Dysrhythmia    Heart murmur    History of blood transfusion    01/27/22   History of colon polyps    Hypertension    Insomnia    Mitral valve prolapse    Mitral valve regurgitation    Persistent atrial fibrillation (HCC) 12/02/2018   S/P Maze operation for atrial fibrillation 03/21/2019   Complete bilateral atrial lesion set using cryothermy and bipolar radiofrequency ablation with clipping of LA  appendage via right mini thoracotomy approach   S/P minimally invasive mitral valve repair 03/21/2019   Complex valvuloplasty including triangular resection of posterior leaflet, artificial Gore-tex neochord placement x4 and 36 mm Sorin Memo 4D ring annuloplasty via right mini thoracotomy approach   Seizures (HCC)    had one approx. 30 yrs ago,has not had any since; due to alchol and no sleep   Severe mitral regurgitation     Family History  Problem Relation Age of Onset   Hypertension Father    Colon cancer Father        dx in his late 18's   Diabetes Mother    Stomach cancer Neg Hx    Esophageal cancer Neg Hx    Pancreatic cancer Neg Hx    Rectal cancer Neg Hx  Past Surgical History:  Procedure Laterality Date   ANKLE FUSION  left   Dec. 2012   APPLICATION OF WOUND VAC Left 09/22/2021   Procedure: APPLICATION OF WOUND VAC;  Surgeon: Kathryne Hitch, MD;  Location: [facility] OR;  Service: Orthopedics;  Laterality: Left;   APPLICATION OF WOUND VAC Left 12/30/2021   Procedure: APPLICATION OF WOUND VAC;  Surgeon: Kathryne Hitch, MD;  Location: [facility] OR;  Service: Orthopedics;  Laterality: Left;   APPLICATION OF WOUND VAC Left 04/10/2022   Procedure: APPLICATION OF WOUND VAC;  Surgeon: Nadara Mustard, MD;  Location: [facility] OR;  Service: Orthopedics;  Laterality: Left;   ARTHROSCOPY KNEE W/ DRILLING  bilateral   2012   ATRIAL FIBRILLATION ABLATION N/A 07/15/2023   Procedure: ATRIAL FIBRILLATION ABLATION;  Surgeon: Maurice Small, MD;  Location: [facility] INVASIVE CV LAB;  Service: Cardiovascular;  Laterality: N/A;   BIOPSY  01/30/2022   Procedure: BIOPSY;  Surgeon: Meridee Score Netty Starring., MD;  Location: [facility];  Service: Gastroenterology;;   CARDIAC VALVE REPLACEMENT     CARDIOVERSION N/A 01/03/2019   Procedure: CARDIOVERSION;  Surgeon: Quintella Reichert, MD;  Location: [facility] ENDOSCOPY;  Service: Cardiovascular;  Laterality: N/A;   CARDIOVERSION N/A 06/23/2019   Procedure:  CARDIOVERSION;  Surgeon: Pricilla Riffle, MD;  Location: [facility] ENDOSCOPY;  Service: Cardiovascular;  Laterality: N/A;   CARDIOVERSION N/A 12/11/2022   Procedure: CARDIOVERSION;  Surgeon: Laurey Morale, MD;  Location: [facility] ENDOSCOPY;  Service: Cardiovascular;  Laterality: N/A;   CLIPPING OF ATRIAL APPENDAGE  03/21/2019   Procedure: Clipping Of Atrial Appendage using 45mm Atricure Pro2 Clip;  Surgeon: Purcell Nails, MD;  Location: [facility] OR;  Service: Open Heart Surgery;;   COLONOSCOPY     x2   COLONOSCOPY N/A 01/30/2022   Procedure: COLONOSCOPY;  Surgeon: Lemar Lofty., MD;  Location: [facility] ENDOSCOPY;  Service: Gastroenterology;  Laterality: N/A;   ESOPHAGOGASTRODUODENOSCOPY N/A 01/30/2022   Procedure: ESOPHAGOGASTRODUODENOSCOPY (EGD);  Surgeon: Lemar Lofty., MD;  Location: [facility];  Service: Gastroenterology;  Laterality: N/A;   FOOT ARTHRODESIS  06/23/2012   Procedure: ARTHRODESIS FOOT;  Surgeon: Toni Arthurs, MD;  Location: [facility] OR;  Service: Orthopedics;  Laterality: Left;  Left Subtalar and Talonavicular Joint Revision Arthrodesis  Aspiration of Bone Marrow from Left Hip    HAIR TRANSPLANT     HARDWARE REMOVAL  06/23/2012   Procedure: HARDWARE REMOVAL;  Surgeon: Toni Arthurs, MD;  Location: [facility] OR;  Service: Orthopedics;  Laterality: Left;  Removal of Deep Implant  X's 3   I & D EXTREMITY Left 09/23/2021   Procedure: IRRIGATION AND DEBRIDEMENT OF LEG;  Surgeon: Kathryne Hitch, MD;  Location: [facility] OR;  Service: Orthopedics;  Laterality: Left;   I & D EXTREMITY Left 09/26/2021   Procedure: REPEAT IRRIGATION AND DEBRIDEMENT LEFT LEG, POSSIBLE WOUND CLOSURE, POSSIBLE VAC CHANGE, POSSIBLE SKIN GRAFT;  Surgeon: Kathryne Hitch, MD;  Location: [facility] OR;  Service: Orthopedics;  Laterality: Left;   I & D EXTREMITY Left 12/30/2021   Procedure: IRRIGATION AND DEBRIDEMENT LEFT LOWER LEG WOUND;  Surgeon: Kathryne Hitch, MD;  Location: [facility] OR;  Service: Orthopedics;  Laterality:  Left;   I & D EXTREMITY Left 01/02/2022   Procedure: LEFT LEG DEBRIDEMENT AND TISSUE GRAFT;  Surgeon: Nadara Mustard, MD;  Location: [facility] OR;  Service: Orthopedics;  Laterality: Left;   INCISION AND DRAINAGE WOUND WITH FASCIOTOMY Left 09/22/2021   Procedure: INCISION AND DRAINAGE WOUND WITH FASCIOTOMY;  Surgeon: Kathryne Hitch,  MD;  Location: [facility] OR;  Service: Orthopedics;  Laterality: Left;   INSERTION OF MESH N/A 07/10/2016   Procedure: INSERTION OF MESH;  Surgeon: Abigail Miyamoto, MD;  Location: [facility];  Service: General;  Laterality: N/A;   JOINT REPLACEMENT     right hip  01-2011   LEFT HEART CATH AND CORONARY ANGIOGRAPHY N/A 03/16/2019   Procedure: LEFT HEART CATH AND CORONARY ANGIOGRAPHY;  Surgeon: Tonny Bollman, MD;  Location: [facility] INVASIVE CV LAB;  Service: Cardiovascular;  Laterality: N/A;   LIMB SPARING RESECTION HIP W/ SADDLE JOINT REPLACEMENT Right    MINIMALLY INVASIVE MAZE PROCEDURE N/A 03/21/2019   Procedure: MINIMALLY INVASIVE MAZE PROCEDURE;  Surgeon: Purcell Nails, MD;  Location: [facility] OR;  Service: Open Heart Surgery;  Laterality: N/A;   MITRAL VALVE REPAIR Right 03/21/2019   Procedure: MINIMALLY INVASIVE MITRAL VALVE REPAIR (MVR) using Memo 4D ring size 36;  Surgeon: Purcell Nails, MD;  Location: [facility] OR;  Service: Open Heart Surgery;  Laterality: Right;   POLYPECTOMY  01/30/2022   Procedure: POLYPECTOMY;  Surgeon: Mansouraty, Netty Starring., MD;  Location: [facility];  Service: Gastroenterology;;   SKIN SPLIT GRAFT Left 04/10/2022   Procedure: APPLY SKIN GRAFT TO LEFT LEG WOUND;  Surgeon: Nadara Mustard, MD;  Location: [facility] OR;  Service: Orthopedics;  Laterality: Left;   TEE WITHOUT CARDIOVERSION N/A 01/03/2019   Procedure: TRANSESOPHAGEAL ECHOCARDIOGRAM (TEE);  Surgeon: Quintella Reichert, MD;  Location: [facility] ENDOSCOPY;  Service: Cardiovascular;  Laterality: N/A;   TEE WITHOUT CARDIOVERSION N/A 03/21/2019   Procedure: TRANSESOPHAGEAL ECHOCARDIOGRAM (TEE);  Surgeon:  Purcell Nails, MD;  Location: [facility] OR;  Service: Open Heart Surgery;  Laterality: N/A;   TEE WITHOUT CARDIOVERSION N/A 12/11/2022   Procedure: TRANSESOPHAGEAL ECHOCARDIOGRAM (TEE);  Surgeon: Laurey Morale, MD;  Location: [facility] ENDOSCOPY;  Service: Cardiovascular;  Laterality: N/A;   TEMPORARY PACEMAKER N/A 03/22/2019   Procedure: TEMPORARY PACEMAKER;  Surgeon: Lyn Records, MD;  Location: [facility] INVASIVE CV LAB;  Service: Cardiovascular;  Laterality: N/A;   TOTAL KNEE ARTHROPLASTY Right 01/19/2014   Procedure: RIGHT TOTAL KNEE ARTHROPLASTY, Steroid injection left knee;  Surgeon: Kathryne Hitch, MD;  Location: [facility] ORS;  Service: Orthopedics;  Laterality: Right;   TOTAL KNEE ARTHROPLASTY Left 10/23/2014   Procedure: LEFT TOTAL KNEE ARTHROPLASTY;  Surgeon: Kathryne Hitch, MD;  Location: [facility] ORS;  Service: Orthopedics;  Laterality: Left;   UMBILICAL HERNIA REPAIR N/A 07/10/2016   Procedure: UMBILICAL HERNIA REPAIR;  Surgeon: Abigail Miyamoto, MD;  Location: [facility];  Service: General;  Laterality: N/A;   Social History   Occupational History   Occupation: Teaching laboratory technician  Tobacco Use   Smoking status: Never   Smokeless tobacco: Never  Vaping Use   Vaping status: Never Used  Substance and Sexual Activity   Alcohol use: Not Currently    Alcohol/week: 21.0 standard drinks of alcohol    Types: 21 Shots of liquor per week   Drug use: No   Sexual activity: Yes

## 2023-10-07 ENCOUNTER — Ambulatory Visit: Payer: Medicare HMO | Attending: Internal Medicine | Admitting: *Deleted

## 2023-10-07 DIAGNOSIS — I4819 Other persistent atrial fibrillation: Secondary | ICD-10-CM | POA: Diagnosis not present

## 2023-10-07 DIAGNOSIS — I4892 Unspecified atrial flutter: Secondary | ICD-10-CM | POA: Diagnosis not present

## 2023-10-07 DIAGNOSIS — Z5181 Encounter for therapeutic drug level monitoring: Secondary | ICD-10-CM | POA: Diagnosis not present

## 2023-10-07 DIAGNOSIS — I4811 Longstanding persistent atrial fibrillation: Secondary | ICD-10-CM

## 2023-10-07 DIAGNOSIS — Z7901 Long term (current) use of anticoagulants: Secondary | ICD-10-CM

## 2023-10-07 LAB — POCT INR: INR: 1.7 — AB (ref 2.0–3.0)

## 2023-10-07 NOTE — Patient Instructions (Signed)
Description   Take 2 tablets today and then continue taking warfarin 1 tablet daily except 1.5 tablets on Wednesdays and Fridays.   Recheck INR in 7 weeks.  Coumadin Clinic [phone].

## 2023-10-13 ENCOUNTER — Ambulatory Visit: Payer: Medicare HMO | Admitting: Cardiovascular Disease

## 2023-10-17 ENCOUNTER — Emergency Department (HOSPITAL_COMMUNITY): Payer: Medicare HMO

## 2023-10-17 ENCOUNTER — Other Ambulatory Visit: Payer: Self-pay

## 2023-10-17 ENCOUNTER — Emergency Department (HOSPITAL_COMMUNITY)
Admission: EM | Admit: 2023-10-17 | Discharge: 2023-10-17 | Disposition: A | Discharge status: Home / Self Care | Payer: Medicare HMO | Attending: Emergency Medicine | Admitting: Emergency Medicine

## 2023-10-17 DIAGNOSIS — Z7901 Long term (current) use of anticoagulants: Secondary | ICD-10-CM | POA: Diagnosis not present

## 2023-10-17 DIAGNOSIS — W010XXA Fall on same level from slipping, tripping and stumbling without subsequent striking against object, initial encounter: Secondary | ICD-10-CM | POA: Diagnosis not present

## 2023-10-17 DIAGNOSIS — S73014A Posterior dislocation of right hip, initial encounter: Secondary | ICD-10-CM

## 2023-10-17 DIAGNOSIS — I509 Heart failure, unspecified: Secondary | ICD-10-CM | POA: Insufficient documentation

## 2023-10-17 DIAGNOSIS — S0003XA Contusion of scalp, initial encounter: Secondary | ICD-10-CM | POA: Insufficient documentation

## 2023-10-17 DIAGNOSIS — S73004A Unspecified dislocation of right hip, initial encounter: Secondary | ICD-10-CM | POA: Insufficient documentation

## 2023-10-17 DIAGNOSIS — M25551 Pain in right hip: Secondary | ICD-10-CM | POA: Diagnosis present

## 2023-10-17 LAB — BASIC METABOLIC PANEL
Anion gap: 8 (ref 5–15)
BUN: 23 mg/dL (ref 8–23)
CO2: 24 mmol/L (ref 22–32)
Calcium: 8.2 mg/dL — ABNORMAL LOW (ref 8.9–10.3)
Chloride: 101 mmol/L (ref 98–111)
Creatinine, Ser: 0.95 mg/dL (ref 0.61–1.24)
GFR, Estimated: >60 mL/min (ref >60)
Glucose, Bld: 117 mg/dL — ABNORMAL HIGH (ref 70–99)
Potassium: 3.6 mmol/L (ref 3.5–5.1)
Sodium: 133 mmol/L — ABNORMAL LOW (ref 135–145)

## 2023-10-17 LAB — CBC WITH DIFFERENTIAL/PLATELET
Abs Immature Granulocytes: 0.06 10*3/uL (ref 0.00–0.07)
Basophils Absolute: 0 10*3/uL (ref 0.0–0.1)
Basophils Relative: 0 %
Eosinophils Absolute: 0 10*3/uL (ref 0.0–0.5)
Eosinophils Relative: 0 %
HCT: 41.8 % (ref 39.0–52.0)
Hemoglobin: 13.9 g/dL (ref 13.0–17.0)
Immature Granulocytes: 1 %
Lymphocytes Relative: 5 %
Lymphs Abs: 0.5 10*3/uL — ABNORMAL LOW (ref 0.7–4.0)
MCH: 32.2 pg (ref 26.0–34.0)
MCHC: 33.3 g/dL (ref 30.0–36.0)
MCV: 96.8 fL (ref 80.0–100.0)
Monocytes Absolute: 0.5 10*3/uL (ref 0.1–1.0)
Monocytes Relative: 5 %
Neutro Abs: 8.9 10*3/uL — ABNORMAL HIGH (ref 1.7–7.7)
Neutrophils Relative %: 89 %
Platelets: 169 10*3/uL (ref 150–400)
RBC: 4.32 MIL/uL (ref 4.22–5.81)
RDW: 13.4 % (ref 11.5–15.5)
WBC: 10 10*3/uL (ref 4.0–10.5)
nRBC: 0 % (ref 0.0–0.2)

## 2023-10-17 LAB — PROTIME-INR
INR: 1.9 — ABNORMAL HIGH (ref 0.8–1.2)
Prothrombin Time: 22 s — ABNORMAL HIGH (ref 11.4–15.2)

## 2023-10-17 MED ORDER — ONDANSETRON HCL 4 MG/2ML IJ SOLN
4.0000 mg | Freq: Once | INTRAMUSCULAR | Status: AC
  Administered 2023-10-17: 4 mg via INTRAVENOUS
  Filled 2023-10-17: qty 2

## 2023-10-17 MED ORDER — OXYCODONE-ACETAMINOPHEN 5-325 MG PO TABS
1.0000 | ORAL_TABLET | Freq: Once | ORAL | Status: AC
  Administered 2023-10-17: 1 via ORAL
  Filled 2023-10-17: qty 1

## 2023-10-17 MED ORDER — OXYCODONE HCL 5 MG PO TABS
5.0000 mg | ORAL_TABLET | Freq: Once | ORAL | Status: AC
Start: 2023-10-17 — End: 2023-10-17
  Administered 2023-10-17: 5 mg via ORAL
  Filled 2023-10-17: qty 1

## 2023-10-17 MED ORDER — FENTANYL CITRATE PF 50 MCG/ML IJ SOSY
50.0000 ug | PREFILLED_SYRINGE | Freq: Once | INTRAMUSCULAR | Status: AC
  Administered 2023-10-17: 50 ug via INTRAVENOUS
  Filled 2023-10-17: qty 1

## 2023-10-17 MED ORDER — SODIUM CHLORIDE 0.9 % IV BOLUS
500.0000 mL | Freq: Once | INTRAVENOUS | Status: AC
  Administered 2023-10-17: 500 mL via INTRAVENOUS

## 2023-10-17 MED ORDER — FENTANYL CITRATE PF 50 MCG/ML IJ SOSY
50.0000 ug | PREFILLED_SYRINGE | Freq: Once | INTRAMUSCULAR | Status: AC
Start: 2023-10-17 — End: 2023-10-17
  Administered 2023-10-17: 50 ug via INTRAVENOUS
  Filled 2023-10-17: qty 1

## 2023-10-17 MED ORDER — PROPOFOL 10 MG/ML IV BOLUS
1.0000 mg/kg | Freq: Once | INTRAVENOUS | Status: AC
  Administered 2023-10-17: 113.4 mg via INTRAVENOUS
  Filled 2023-10-17: qty 20

## 2023-10-17 NOTE — ED Provider Notes (Signed)
[facility] AT [facility] Provider Note   CSN: [id number] Arrival date & time: 10/17/23  0411     History  Chief Complaint  Patient presents with   [name] is a 67 y.o. male.   Fall  67 year old male history of atrial fibrillation on Coumadin, congestive heart failure, seizures presenting for fall.  Patient states around 630 last night he tripped and fell.  Denies hitting his head but landed on his right hip and has had severe right hip pain since.  He laid on the ground until early this morning, he was hoping his pain would go away but it did not so he called EMS.  Denies any headache, neck pain, chest pain, back pain, Donnell pain.  No pain outside of his right hip.  No numbness or tingling.     Home Medications Prior to Admission medications   Medication Sig Start Date End Date Taking? Authorizing Provider  ALPRAZolam Prudy Feeler) 0.5 MG tablet Take 0.5 mg by mouth 2 (two) times daily. 12/16/21  Yes [provider]  atorvastatin (LIPITOR) 20 MG tablet Take 20 mg by mouth every morning.   Yes [provider]  bismuth subsalicylate (PEPTO BISMOL) 262 MG/15ML suspension Take 30 mLs by mouth every 6 (six) hours as needed for indigestion or diarrhea or loose stools.   Yes [provider]  Docusate Sodium (DSS) 100 MG CAPS Take 3 capsules by mouth every morning.   Yes [provider]  meclizine (ANTIVERT) 25 MG tablet Take 50 mg by mouth daily as needed for dizziness.   Yes [provider]  Melatonin 10 MG TABS Take 10 mg by mouth at bedtime.   Yes [provider]  metoprolol tartrate (LOPRESSOR) 25 MG tablet Take 25 mg by mouth daily. 06/22/23  Yes [provider]  Multiple Vitamin (MULTIVITAMIN WITH MINERALS) TABS tablet Take 1 tablet by mouth every morning.   Yes [provider]  oxyCODONE-acetaminophen (PERCOCET) 10-325 MG tablet Take 1-1.5 tablets by mouth every 8  (eight) hours as needed for pain. 06/16/23  Yes [provider]  sacubitril-valsartan (ENTRESTO) 49-51 MG Take 1 tablet by mouth 2 (two) times daily. 02/02/23  Yes Laurey Morale, MD  silver sulfADIAZINE (SILVADENE) 1 % cream Apply 1 Application topically daily. Apply to affected area daily plus dry dressing Patient taking differently: Apply 1 Application topically every other day. 07/07/22  Yes Nadara Mustard, MD  spironolactone (ALDACTONE) 25 MG tablet Take 1 tablet (25 mg total) by mouth daily. 07/29/23  Yes Laurey Morale, MD  torsemide (DEMADEX) 20 MG tablet TAKE 1 TABLET EVERY DAY 06/22/23  Yes Runell Gess, MD  warfarin (COUMADIN) 5 MG tablet TAKE 1 TABLET EVERY DAY EXCEPT TAKE 1 AND 1/2 TABLETS ON WEDNESDAY AS INSTRUCTED Patient taking differently: TAKE 1 TABLET EVERY DAY EXCEPT TAKE 1 AND 1/2 TABLETS ON [facility] AND FRIDAYS 05/11/23  Yes Runell Gess, MD  OVER THE COUNTER MEDICATION Take 1 capsule by mouth daily. Brilliant Brain supplement Patient not taking: Reported on 10/17/2023    [provider]  predniSONE (STERAPRED UNI-PAK 21 TAB) 10 MG (21) TBPK tablet Take by mouth as directed. Patient not taking: Reported on 10/17/2023 06/09/23   [provider]      Allergies    Patient has no known allergies.    Review of Systems   Review of Systems Review of systems completed and notable as per HPI.  ROS  otherwise negative.   Physical Exam Updated Vital Signs BP (!) 156/84   Pulse 91   Temp 97.8 F (36.6 C)   Resp 20   Ht 6\' 3"  (1.905 m)   Wt 113.4 kg   SpO2 100%   BMI 31.25 kg/m  Physical Exam Vitals and nursing note reviewed.  Constitutional:      General: He is not in acute distress.    Appearance: He is well-developed.  HENT:     Head: Normocephalic and atraumatic.     Nose: Nose normal.     Mouth/Throat:     Mouth: Mucous membranes are moist.     Pharynx: Oropharynx is clear.  Eyes:     Extraocular Movements: Extraocular  movements intact.     Conjunctiva/sclera: Conjunctivae normal.     Pupils: Pupils are equal, round, and reactive to light.  Cardiovascular:     Rate and Rhythm: Normal rate and regular rhythm.     Heart sounds: No murmur heard. Pulmonary:     Effort: Pulmonary effort is normal. No respiratory distress.     Breath sounds: Normal breath sounds.  Abdominal:     Palpations: Abdomen is soft.     Tenderness: There is no abdominal tenderness. There is no guarding or rebound.  Musculoskeletal:        General: No swelling.     Cervical back: Normal range of motion and neck supple. No rigidity or tenderness.     Right lower leg: No edema.     Left lower leg: No edema.     Comments: Mild tenderness around the right hip.  Range of motion of right hip is limited due to pain.  He has no tenderness along the femur, knee, leg or foot.  He has palpable DP and PT pulse.  Normal strength and sensation.  Skin:    General: Skin is warm and dry.     Capillary Refill: Capillary refill takes less than 2 seconds.  Neurological:     General: No focal deficit present.     Mental Status: He is alert and oriented to person, place, and time. Mental status is at baseline.  Psychiatric:        Mood and Affect: Mood normal.     ED Results / Procedures / Treatments   Labs (all labs ordered are listed, but only abnormal results are displayed) Labs Reviewed  BASIC METABOLIC PANEL - Abnormal; Notable for the following components:      Result Value   Sodium 133 (*)    Glucose, Bld 117 (*)    Calcium 8.2 (*)    All other components within normal limits  CBC WITH DIFFERENTIAL/PLATELET - Abnormal; Notable for the following components:   Neutro Abs 8.9 (*)    Lymphs Abs 0.5 (*)    All other components within normal limits  PROTIME-INR - Abnormal; Notable for the following components:   Prothrombin Time 22.0 (*)    INR 1.9 (*)    All other components within normal limits    EKG None  Radiology DG Pelvis  Portable Result Date: 10/17/2023 CLINICAL DATA:  Right hip prosthesis dislocation. Re-evaluate following dislocation reduction. EXAM: PORTABLE PELVIS 1-2 VIEWS COMPARISON:  AP pelvis and right hip views today at 5:23 a.m. FINDINGS: Only a single AP pelvis is provided. The right femoral head prosthesis however, appears to now be normally located in the acetabular cup. No fracture is evident. There is mild arthrosis at the left hip. Old vasectomy clips. IMPRESSION:  The right femoral head prosthesis appears to now be normally located in the acetabular cup prosthesis by the AP projection. No fracture is evident. Electronically Signed   By: Almira Bar M.D.   On: 10/17/2023 06:54   CT Cervical Spine Wo Contrast Result Date: 10/17/2023 CLINICAL DATA:  67 year old male status post fall at 1830 hours yesterday. Pain. EXAM: CT CERVICAL SPINE WITHOUT CONTRAST TECHNIQUE: Multidetector CT imaging of the cervical spine was performed without intravenous contrast. Multiplanar CT image reconstructions were also generated. RADIATION DOSE REDUCTION: This exam was performed according to the departmental dose-optimization program which includes automated exposure control, adjustment of the mA and/or kV according to patient size and/or use of iterative reconstruction technique. COMPARISON:  Head CT today.  Prior cervical spine CT 10/24/2022. FINDINGS: Alignment: Chronic straightening but mildly improved cervical lordosis from last year. Cervicothoracic junction alignment is within normal limits. Bilateral posterior element alignment is within normal limits. Skull base and vertebrae: Bone mineralization is within normal limits for age. Visualized skull base is intact. No atlanto-occipital dissociation. C1 and C2 appear chronically degenerated anteriorly, but intact and aligned. No acute osseous abnormality identified. Soft tissues and spinal canal: No prevertebral fluid or swelling. No visible canal hematoma. Calcified cervical  carotid atherosclerosis, partially retropharyngeal right carotid. Pharynx and larynx motion artifact. Otherwise negative visible noncontrast neck soft tissues. Disc levels: Chronic cervical spine degeneration. Subtle anterolisthesis of C4 on C5 is chronic with chronic left side facet arthropathy there. Disc and endplate degeneration maximal at C5-C6 and C6-C7. Mild chronic spinal stenosis suspected at the former. Upper chest: Visible upper thoracic levels appear intact. Negative lung apices. Mild Calcified aortic atherosclerosis. IMPRESSION: 1. No acute traumatic injury identified in the cervical spine. 2. Chronic cervical spine degeneration appears stable from last year. Electronically Signed   By: Odessa Fleming M.D.   On: 10/17/2023 06:03   DG Hip Unilat With Pelvis 2-3 Views Right Result Date: 10/17/2023 CLINICAL DATA:  67 year old male status post fall at 1830 hours yesterday. Pain. EXAM: DG HIP (WITH OR WITHOUT PELVIS) 2-3V RIGHT COMPARISON:  CT Abdomen and Pelvis 01/30/2022 and earlier. FINDINGS: Three views at 0526 hours including AP and cross-table lateral. Dislocated bipolar right hip arthroplasty. The femoral head is displaced posteriorly, superiorly, and slightly lateral from the acetabular cup. No periprosthetic fracture is identified. Nonobstructed bowel-gas pattern. Negative. IMPRESSION: Posterosuperior dislocated bipolar right hip arthroplasty. No acute fracture identified. Electronically Signed   By: Odessa Fleming M.D.   On: 10/17/2023 06:00   DG Chest Port 1 View Result Date: 10/17/2023 CLINICAL DATA:  67 year old male status post fall at 1830 hours yesterday. Pain. EXAM: PORTABLE CHEST 1 VIEW COMPARISON:  Chest radiograph 05/22/2019 and earlier. FINDINGS: Portable AP semi upright views at 0520 hours. Cardiac valve prosthesis which was present on cardiac CT in September this year, new since 2020. Stable cardiac size and mediastinal contours. Chronic mild tortuosity of the thoracic aorta. Stable lung  volumes. Allowing for portable technique the lungs are clear. No pneumothorax or pleural effusion identified. Visualized tracheal air column is within normal limits. Negative visible bowel gas. No acute osseous abnormality identified. IMPRESSION: 1. No acute cardiopulmonary abnormality or acute traumatic injury identified. 2. Cardiac valve replacement since 2020. Electronically Signed   By: Odessa Fleming M.D.   On: 10/17/2023 05:58   CT Head Wo Contrast Result Date: 10/17/2023 CLINICAL DATA:  67 year old male status post fall at 1830 hours yesterday. Pain. EXAM: CT HEAD WITHOUT CONTRAST TECHNIQUE: Contiguous axial images were obtained from the  base of the skull through the vertex without intravenous contrast. RADIATION DOSE REDUCTION: This exam was performed according to the departmental dose-optimization program which includes automated exposure control, adjustment of the mA and/or kV according to patient size and/or use of iterative reconstruction technique. COMPARISON:  Head CT 09/21/2021. FINDINGS: Brain: Cerebral volume remains normal for age. No midline shift, ventriculomegaly, mass effect, evidence of mass lesion, intracranial hemorrhage or evidence of cortically based acute infarction. Gray-white differentiation appears stable and within normal limits for age. Vascular: No suspicious intracranial vascular hyperdensity. Skull: Stable and intact.  No acute osseous abnormality identified. Sinuses/Orbits: Visualized paranasal sinuses and mastoids are stable and well aerated. Other: Right posterior scalp hematoma/contusion on series 4, image 40. Other orbit and scalp soft tissues appears stable. Underlying calvarium appears stable and intact. No scalp soft tissue gas. IMPRESSION: 1. Right posterior scalp hematoma/contusion.  No skull fracture. 2. Stable and negative for age noncontrast CT appearance of the brain. Electronically Signed   By: Odessa Fleming M.D.   On: 10/17/2023 05:57     Procedures .Sedation  Date/Time: 10/17/2023 7:01 AM  Performed by: Laurence Spates, MD Authorized by: Laurence Spates, MD   Consent:    Consent obtained:  Verbal   Consent given by:  Patient   Risks discussed:  Allergic reaction, dysrhythmia, inadequate sedation, nausea, prolonged hypoxia resulting in organ damage, prolonged sedation necessitating reversal, respiratory compromise necessitating ventilatory assistance and intubation and vomiting   Alternatives discussed:  Analgesia without sedation, anxiolysis and regional anesthesia Universal protocol:    Procedure explained and questions answered to patient or proxy's satisfaction: yes     Relevant documents present and verified: yes     Test results available: yes     Imaging studies available: yes     Required blood products, implants, devices, and special equipment available: yes     Site/side marked: yes     Immediately prior to procedure, a time out was called: yes     Patient identity confirmed:  Verbally with patient Indications:    Procedure performed:  Dislocation reduction   Procedure necessitating sedation performed by:  Physician performing sedation Pre-sedation assessment:    Time since last food or drink:  12 hours   ASA classification: class 3 - patient with severe systemic disease     Mouth opening:  3 or more finger widths   Thyromental distance:  4 finger widths   Mallampati score:  II - soft palate, uvula, fauces visible   Neck mobility: normal     Pre-sedation assessments completed and reviewed: airway patency, cardiovascular function, hydration status, mental status, nausea/vomiting, pain level, respiratory function and temperature   A pre-sedation assessment was completed prior to the start of the procedure Immediate pre-procedure details:    Reassessment: Patient reassessed immediately prior to procedure     Reviewed: vital signs, relevant labs/tests and NPO status     Verified: bag valve mask  available, emergency equipment available, intubation equipment available, IV patency confirmed, oxygen available and suction available   Procedure details (see MAR for exact dosages):    Preoxygenation:  Nasal cannula   Sedation:  Propofol   Intended level of sedation: deep   Analgesia:  Fentanyl   Intra-procedure monitoring:  Blood pressure monitoring, cardiac monitor, continuous pulse oximetry, frequent LOC assessments, frequent vital sign checks and continuous capnometry   Intra-procedure events: none     Total Provider sedation time (minutes):  20 Post-procedure details:   A post-sedation assessment was completed  following the completion of the procedure.   Attendance: Constant attendance by certified staff until patient recovered     Recovery: Patient returned to pre-procedure baseline     Post-sedation assessments completed and reviewed: airway patency, cardiovascular function, hydration status, mental status, nausea/vomiting, pain level, respiratory function and temperature     Patient is stable for discharge or admission: yes     Procedure completion:  Tolerated well, no immediate complications .Ortho Injury Treatment  Date/Time: 10/17/2023 7:01 AM  Performed by: Laurence Spates, MD Authorized by: Laurence Spates, MD   Consent:    Consent obtained:  Verbal   Consent given by:  Patient   Risks discussed:  Irreducible dislocation   Alternatives discussed:  No treatmentInjury location: hip Location details: right hip Injury type: dislocation Dislocation type: posterior Spontaneous dislocation: yes Prosthesis: yes Pre-procedure neurovascular assessment: neurovascularly intact  Anesthesia: Local anesthesia used: no  Patient sedated: Yes. Refer to sedation procedure documentation for details of sedation. Manipulation performed: yes Reduction method: Allis maneuver Reduction successful: yes X-ray confirmed reduction: yes Immobilization: brace Post-procedure  neurovascular assessment: post-procedure neurovascularly intact       Medications Ordered in ED Medications  fentaNYL (SUBLIMAZE) injection 50 mcg (50 mcg Intravenous Given 10/17/23 0456)  sodium chloride 0.9 % bolus 500 mL (500 mLs Intravenous Bolus 10/17/23 0544)  ondansetron (ZOFRAN) injection 4 mg (4 mg Intravenous Given 10/17/23 0543)  propofol (DIPRIVAN) 10 mg/mL bolus/IV push 113.4 mg (113.4 mg Intravenous Given 10/17/23 0612)  fentaNYL (SUBLIMAZE) injection 50 mcg (50 mcg Intravenous Given 10/17/23 4098)    ED Course/ Medical Decision Making/ A&P                                 Medical Decision Making Amount and/or Complexity of Data Reviewed Labs: ordered. Radiology: ordered.  Risk Prescription drug management.   Medical Decision Making:   DAIVEN WINTERROWD is a 67 y.o. male who presented to the ED today with mechanical fall and right hip pain.  No signs reviewed.  Exam is neurovascular tact with significantly limited range of motion of the right hip.  Concern for fracture or dislocation.  Lab work here is overall unremarkable, sodium slightly low 133.  INR is elevated but he is on Coumadin.  CT scans of the head and neck obtained without signs of acute injury.  No pain to the belly, chest, back.  Chest x-ray is unremarkable.  Hip x-ray is consistent with posterior dislocation of his hip, no fracture.   Patient placed on continuous vitals and telemetry monitoring while in ED which was reviewed periodically.  Reviewed and confirmed nursing documentation for past medical history, family history, social history.  Reassessment and Plan:   Patient sedated and hip dislocation reduced as above.  Pain is markedly improved.  Plan for knee immobilizer, ambulation trial.  Handoff was given to Dr. Particia Nearing will plan for ambulation trial to determine disposition.   Patient's presentation is most consistent with acute complicated illness / injury requiring diagnostic workup.            Final Clinical Impression(s) / ED Diagnoses Final diagnoses:  Closed posterior dislocation of right hip, initial encounter [facility])    Rx / DC Orders ED Discharge Orders     None         Laurence Spates, MD 10/17/23 [phone]

## 2023-10-17 NOTE — ED Triage Notes (Signed)
Pt brought by ems from home with complaints of mechanical fall happened at 6:30pm yesterday. As per pt he tripped over and landed to his right hip. Denied LOC, Denied hitting his head. With thinners. With right hip replacement 10 years ago. No pain when not moving. Ems gave fentanyl.

## 2023-10-17 NOTE — ED Provider Notes (Signed)
.  Reduction of dislocation  Date/Time: 10/17/2023 6:27 AM  Performed by: Fayrene Helper, PA-C Authorized by: Fayrene Helper, PA-C  Consent: Verbal consent obtained. Written consent obtained. Risks and benefits: risks, benefits and alternatives were discussed Consent given by: patient Patient understanding: patient states understanding of the procedure being performed Patient consent: the patient's understanding of the procedure matches consent given Procedure consent: procedure consent matches procedure scheduled Relevant documents: relevant documents present and verified Imaging studies: imaging studies available Required items: required blood products, implants, devices, and special equipment available Patient identity confirmed: verbally with patient and arm band Time out: Immediately prior to procedure a "time out" was called to verify the correct patient, procedure, equipment, support staff and site/side marked as required.  Sedation: Patient sedated: yes  Patient tolerance: patient tolerated the procedure well with no immediate complications Comments: Reduction of R hip by manipulation which includes hip flexion and forward traction along with medial/lateral rotation.  Successful reduction confirmed on repeat xray       Fayrene Helper, PA-C 10/17/23 [id number]    Laurence Spates, MD 10/17/23 [phone]

## 2023-10-17 NOTE — ED Provider Notes (Signed)
Pt signed out by Dr. Earlene Plater.  Pt is feeling better and is able to ambulate with a knee immobilizer and walker.  He has a walker and wheelchair at home.  He is stable for d/c. He has pain meds at home.  He is to f/u with Dr. Magnus Ivan.  Return if worse.   Jacalyn Lefevre, MD 10/17/23 [phone]

## 2023-10-17 NOTE — ED Notes (Signed)
Pt consented for procedure done Connected Pt to cardiac monitor and hooked to 02 per Starr at 3 lpm 06:10 Time out. Respiratory,RN,MD at bedside 06:14 Sedation Started  Vitals stable and recorded 06:15 Procedure Started 06:20 right hip reduction done by MD Vitals been stable all thru out the procedure Pt for Xray.

## 2023-10-18 ENCOUNTER — Telehealth: Payer: Self-pay | Admitting: Orthopaedic Surgery

## 2023-10-18 ENCOUNTER — Encounter: Payer: Self-pay | Admitting: Cardiovascular Disease

## 2023-10-18 ENCOUNTER — Ambulatory Visit: Payer: Medicare HMO | Attending: Cardiovascular Disease | Admitting: Cardiovascular Disease

## 2023-10-18 VITALS — BP 108/74 | HR 95 | Ht 75.0 in | Wt 260.0 lb

## 2023-10-18 DIAGNOSIS — I4819 Other persistent atrial fibrillation: Secondary | ICD-10-CM

## 2023-10-18 DIAGNOSIS — I4892 Unspecified atrial flutter: Secondary | ICD-10-CM

## 2023-10-18 NOTE — Progress Notes (Signed)
Electrophysiology Office Note:    Date:  10/18/2023   ID:  [name], DOB 1956-08-09, MRN [id number]  PCP:  Tracey Harries, MD   [facility] Providers Cardiologist:  Nanetta Batty, MD Cardiology APP:  Ronney Asters, NP  Electrophysiologist:  Maurice Small, MD     Referring MD: Tracey Harries, MD   History of Present Illness:    [name] is a 67 y.o. male with a hx listed below, significant for CHFrEF (35-40%), severe MR s/p minimally invasive MV repair with MAZE and LA clip in 2020 referred for arrhythmia management.     He had very severe MR with partially flail posterior leaflet and required repair by invasive mitral valve surgery with maze and left atrial appendage clipping in 2020.  He remained in atrial fibrillation, however and was cardioverted in August 2020.  He had recurrence of atrial fibrillation with RVR and a decrease in EF to 45% in October 2022. In October 2023, EF was further decreased to 20-25%, and he was still noted to be in atrial fibrillation with RVR.   He underwent EP study and arrhythmia mapping on July 15, 2023.  This showed that the pulmonary veins and posterior wall remained electrically silent from the prior maze procedure.  We were able to induce an atrial flutter with a cycle length of 280 ms.  Unfortunately, the flutter was not stable enough to map fully and was not easily reinduced.  It did appear to be right sided based upon coronary sinus activation.  There was intact conduction across the CTI line, so a CTI ablation was performed.      He reports that he is doing well today from a rhythm perspective.  He did recently have a fall and displaced his artificial hip which was treated in the ER, but he continues to have some residual hip pain and is in a wheelchair today.   EKGs/Labs/Other Studies Reviewed Today:    Echocardiogram:  TEE 2/24 1. Left ventricular ejection fraction, by estimation, is 40 to 45% . The left  ventricle has mildly decreased function. The left ventricle demonstrates global hypokinesis. There is mild concentric left ventricular hypertrophy. 2. Right ventricular systolic function is mildly reduced. The right ventricular size is normal. 3. The atrial appendage has been surgically clipped, no thrombus in residual appendage or in LA. Left atrial size was mildly dilated. 4. The mitral valve has been repaired. Mild mitral valve regurgitation. No evidence of mitral stenosis. The mean mitral valve gradient is 2. 0 mmHg. There is a prosthetic annuloplasty ring present in the mitral position. Procedure Date: 5/ 19/ 20. 5. The aortic valve is tricuspid. Aortic valve regurgitation is not visualized. No aortic stenosis is present. 6. No ASD or PFO by color doppler.  - Echo (2/20) pre-MV repair in setting of AF: EF 35-40%, moderate RV dysfunction, severe MR.  - Echo (7/20) post-MV repair: EF 55-60%, normal RV, s/p MV repair with mild MR.  - Echo (9/21): EF 55-60%, s/p MV repair with no MR, mean gradient 5.  - Echo (10/22): EF 45-50%, patient was in atrial fibrillation.  - Echo (10/23): EF 25%, global hypokinesis, moderate RV dysfunction, s/p MV repair with no MR and no significant stenosis. The patient was in atrial fibrillation.    Monitors:    Stress testing:   Advanced imaging:    EKG:   EKG Interpretation Date/Time:  Monday October 18 2023 15:38:03 EST Ventricular Rate:  95 PR Interval:  160  QRS Duration:  158 QT Interval:  396 QTC Calculation: 497 R Axis:   -72  Text Interpretation: Sinus rhythm with occasional Premature ventricular complexes Left axis deviation Right bundle branch block When compared with ECG of 12-Aug-2023 13:54, Premature ventricular complexes are now Present Confirmed by York Pellant [phone]) on 10/18/2023 3:41:30 PM    Recent Labs: 11/20/2022: B Natriuretic Peptide 388.9 01/01/2023: ALT 33; TSH 2.335 10/17/2023: BUN 23; Creatinine, Ser 0.95; Hemoglobin 13.9;  Platelets 169; Potassium 3.6; Sodium 133     Physical Exam:    VS:  BP 108/74 (BP Location: Right Arm, Patient Position: Sitting, Cuff Size: Large)   Pulse 95   Ht 6\' 3"  (1.905 m)   Wt 260 lb (117.9 kg)   SpO2 94%   BMI 32.50 kg/m     Wt Readings from Last 3 Encounters:  10/18/23 260 lb (117.9 kg)  10/17/23 250 lb (113.4 kg)  08/12/23 262 lb (118.8 kg)     GEN:  Well nourished, well developed in no acute distress CARDIAC: RRR, no murmurs, rubs, gallops RESPIRATORY:  Normal work of breathing MUSCULOSKELETAL: no edema    ASSESSMENT & PLAN:    Atrial fibrillation and atrial flutter S/p MAZE procedure and LAA clip S/p DCCV x2 (8/20 and 2/24) With multiple recurrences, complicated by CHF with severely reduced EF Maintaining sinus rhythm  S/p ablation of typical atrial flutter  Secondary hypercoagulable state CT showed LAA has been successfully occluded S/p MVR Continue warfarin   Chronic systolic CHF Likely tachycardia-mediated Continue metoprolol XL 25, Entresto 49-51, Aldactone 25            Medication Adjustments/Labs and Tests Ordered: Current medicines are reviewed at length with the patient today.  Concerns regarding medicines are outlined above.  Orders Placed This Encounter  Procedures   EKG 12-Lead   No orders of the defined types were placed in this encounter.    Signed, Maurice Small, MD  10/18/2023 4:01 PM    [facility]

## 2023-10-18 NOTE — Telephone Encounter (Signed)
Patient called and wanted to know what should he do, he fell and the ball came out of the socket of his hip and the ER put back in and now he can barely walk. CB#[phone]

## 2023-10-18 NOTE — Patient Instructions (Signed)

## 2023-10-21 ENCOUNTER — Ambulatory Visit: Payer: Medicare HMO | Admitting: Orthopedic Surgery

## 2023-10-21 DIAGNOSIS — S81802D Unspecified open wound, left lower leg, subsequent encounter: Secondary | ICD-10-CM

## 2023-10-24 ENCOUNTER — Encounter: Payer: Self-pay | Admitting: Orthopedic Surgery

## 2023-10-24 NOTE — Progress Notes (Signed)
Office Visit Note   Patient: [name]           Date of Birth: 1956/06/04           MRN: [id number] Visit Date: 10/21/2023              Requested by: Tracey Harries, MD [address] [place],  [place] [address] PCP: Tracey Harries, MD  Chief Complaint  Patient presents with   Left Leg - Follow-up      HPI: Patient is a 38 of gentleman who is status post treatment for chronic wound left leg.  Patient is most recently status post dislocation of his left hip that was reduced in the emergency room.  Assessment & Plan: Visit Diagnoses:  1. Leg wound, left, subsequent encounter     Plan: Patient will follow-up with Dr. Magnus Ivan for the hip.  He will follow-up as needed for the leg wound.  Follow-Up Instructions: Return if symptoms worsen or fail to improve.   Ortho Exam  Patient is alert, oriented, no adenopathy, well-dressed, normal affect, normal respiratory effort. Examination the wound continues to show slow steady improvement.  The wound is 4 x 2 cm with healthy granulation tissue.  Imaging: No results found. No images are attached to the encounter.  Labs: Lab Results  Component Value Date   HGBA1C 5.4 01/29/2022   HGBA1C 5.2 03/16/2019   REPTSTATUS 01/07/2022 FINAL 01/02/2022   REPTSTATUS 01/08/2022 FINAL 01/02/2022   GRAMSTAIN  01/02/2022    FEW WBC PRESENT,BOTH PMN AND MONONUCLEAR RARE GRAM POSITIVE COCCI RARE GRAM NEGATIVE RODS Performed at [facility], [address] [place], [place] [address]    GRAMSTAIN  01/02/2022    FEW WBC PRESENT,BOTH PMN AND MONONUCLEAR NO ORGANISMS SEEN    CULT  01/02/2022    FEW STREPTOCOCCUS GROUP C Beta hemolytic streptococci are predictably susceptible to penicillin and other beta lactams. Susceptibility testing not routinely performed. ABUNDANT KLEBSIELLA OXYTOCA ABUNDANT STENOTROPHOMONAS MALTOPHILIA    CULT  01/02/2022    NO ANAEROBES ISOLATED Performed at [facility], [address], [place], [place] [address]    LABORGA KLEBSIELLA OXYTOCA 01/02/2022   LABORGA STENOTROPHOMONAS MALTOPHILIA 01/02/2022     Lab Results  Component Value Date   ALBUMIN 4.0 01/01/2023   ALBUMIN 4.0 11/20/2022   ALBUMIN 2.5 (L) 01/30/2022    Lab Results  Component Value Date   MG 2.1 01/29/2022   MG 2.1 06/19/2019   MG 2.2 03/25/2019   No results found for: "VD25OH"  No results found for: "PREALBUMIN"    Latest Ref Rng & Units 10/17/2023    4:53 AM 06/29/2023    7:43 AM 12/11/2022   11:40 AM  CBC EXTENDED  WBC 4.0 - 10.5 K/uL 10.0  4.3    RBC 4.22 - 5.81 MIL/uL 4.32  4.58    Hemoglobin 13.0 - 17.0 g/dL 86.5  78.4  69.6   HCT 39.0 - 52.0 % 41.8  43.7  50.0   Platelets 150 - 400 K/uL 169  184    NEUT# 1.7 - 7.7 K/uL 8.9     Lymph# 0.7 - 4.0 K/uL 0.5        There is no height or weight on file to calculate BMI.  Orders:  No orders of the defined types were placed in this encounter.  No orders of the defined types were placed in this encounter.    Procedures: No procedures performed  Clinical Data:  No additional findings.  ROS:  All other systems negative, except as noted in the HPI. Review of Systems  Objective: Vital Signs: There were no vitals taken for this visit.  Specialty Comments:  No specialty comments available.  PMFS History: Patient Active Problem List   Diagnosis Date Noted   Left ventricular dysfunction 10/13/2022   Rectal bleeding 01/30/2022   Abnormal finding on imaging 01/30/2022   GIB (gastrointestinal bleeding) 01/29/2022   Hyponatremia 01/29/2022   Hypocalcemia 01/29/2022   Alcohol abuse 01/29/2022   AKI (acute kidney injury) (HCC) 01/29/2022   Symptomatic anemia 01/29/2022   Venous stasis dermatitis of both lower extremities    Bleeding on Coumadin    Leg wound, left, subsequent encounter 12/30/2021   Compartment syndrome of left lower extremity (HCC) 09/22/2021   Status post surgery 09/22/2021   Compartment syndrome of  left upper extremity (HCC) 09/22/2021   Hyperlipidemia 12/13/2020   Atrial fibrillation (HCC) 06/10/2020   Long term (current) use of anticoagulants 06/10/2020   Secondary hypercoagulable state (HCC) 04/24/2020   AV junctional bradycardia 03/22/2019   S/P minimally invasive mitral valve repair  03/21/2019   S/P Maze operation for atrial fibrillation 03/21/2019   Dilated aortic root (HCC) 01/04/2019   Dilated cardiomyopathy (HCC) 01/04/2019   Atrial flutter with rapid ventricular response (HCC)    Mitral valve prolapse    Hypertensive urgency 12/30/2018   Acute on chronic systolic (congestive) heart failure (HCC) 12/30/2018   Acute systolic heart failure (HCC) 12/30/2018   Chronic systolic (congestive) heart failure (HCC)    Persistent atrial fibrillation (HCC) 12/02/2018   Right hamstring muscle strain 07/20/2018   Essential hypertension 02/07/2016   Severe mitral regurgitation    Annual physical exam 11/16/2014   Arthritis of left knee 10/23/2014   Status post total left knee replacement 10/23/2014   Arthritis of knee, right 01/19/2014   Status post total knee replacement 01/19/2014   Benign paroxysmal positional vertigo 10/30/2013   Testicular hypofunction 03/24/2012   Past Medical History:  Diagnosis Date   Acute on chronic systolic (congestive) heart failure (HCC) 12/30/2018   Allergy    Arthritis    Atrial flutter with rapid ventricular response (HCC)    Chronic systolic (congestive) heart failure (HCC)    COVID 10/2021   mild   Dilated aortic root (HCC) 01/04/2019   Dilated cardiomyopathy (HCC) 01/04/2019   Dysrhythmia    Heart murmur    History of blood transfusion    01/27/22   History of colon polyps    Hypertension    Insomnia    Mitral valve prolapse    Mitral valve regurgitation    Persistent atrial fibrillation (HCC) 12/02/2018   S/P Maze operation for atrial fibrillation 03/21/2019   Complete bilateral atrial lesion set using cryothermy and bipolar  radiofrequency ablation with clipping of LA appendage via right mini thoracotomy approach   S/P minimally invasive mitral valve repair 03/21/2019   Complex valvuloplasty including triangular resection of posterior leaflet, artificial Gore-tex neochord placement x4 and 36 mm Sorin Memo 4D ring annuloplasty via right mini thoracotomy approach   Seizures (HCC)    had one approx. 30 yrs ago,has not had any since; due to alchol and no sleep   Severe mitral regurgitation     Family History  Problem Relation Age of Onset   Hypertension Father    Colon cancer Father        dx in his late 2's   Diabetes Mother    Stomach cancer Neg  Hx    Esophageal cancer Neg Hx    Pancreatic cancer Neg Hx    Rectal cancer Neg Hx     Past Surgical History:  Procedure Laterality Date   ANKLE FUSION  left   Dec. 2012   APPLICATION OF WOUND VAC Left 09/22/2021   Procedure: APPLICATION OF WOUND VAC;  Surgeon: Kathryne Hitch, MD;  Location: [facility] OR;  Service: Orthopedics;  Laterality: Left;   APPLICATION OF WOUND VAC Left 12/30/2021   Procedure: APPLICATION OF WOUND VAC;  Surgeon: Kathryne Hitch, MD;  Location: [facility] OR;  Service: Orthopedics;  Laterality: Left;   APPLICATION OF WOUND VAC Left 04/10/2022   Procedure: APPLICATION OF WOUND VAC;  Surgeon: Nadara Mustard, MD;  Location: [facility] OR;  Service: Orthopedics;  Laterality: Left;   ARTHROSCOPY KNEE W/ DRILLING  bilateral   2012   ATRIAL FIBRILLATION ABLATION N/A 07/15/2023   Procedure: ATRIAL FIBRILLATION ABLATION;  Surgeon: Maurice Small, MD;  Location: [facility] INVASIVE CV LAB;  Service: Cardiovascular;  Laterality: N/A;   BIOPSY  01/30/2022   Procedure: BIOPSY;  Surgeon: Meridee Score Netty Starring., MD;  Location: [facility];  Service: Gastroenterology;;   CARDIAC VALVE REPLACEMENT     CARDIOVERSION N/A 01/03/2019   Procedure: CARDIOVERSION;  Surgeon: Quintella Reichert, MD;  Location: [facility] ENDOSCOPY;  Service: Cardiovascular;  Laterality: N/A;    CARDIOVERSION N/A 06/23/2019   Procedure: CARDIOVERSION;  Surgeon: Pricilla Riffle, MD;  Location: [facility] ENDOSCOPY;  Service: Cardiovascular;  Laterality: N/A;   CARDIOVERSION N/A 12/11/2022   Procedure: CARDIOVERSION;  Surgeon: Laurey Morale, MD;  Location: [facility] ENDOSCOPY;  Service: Cardiovascular;  Laterality: N/A;   CLIPPING OF ATRIAL APPENDAGE  03/21/2019   Procedure: Clipping Of Atrial Appendage using 45mm Atricure Pro2 Clip;  Surgeon: Purcell Nails, MD;  Location: [facility] OR;  Service: Open Heart Surgery;;   COLONOSCOPY     x2   COLONOSCOPY N/A 01/30/2022   Procedure: COLONOSCOPY;  Surgeon: Lemar Lofty., MD;  Location: [facility] ENDOSCOPY;  Service: Gastroenterology;  Laterality: N/A;   ESOPHAGOGASTRODUODENOSCOPY N/A 01/30/2022   Procedure: ESOPHAGOGASTRODUODENOSCOPY (EGD);  Surgeon: Lemar Lofty., MD;  Location: [facility];  Service: Gastroenterology;  Laterality: N/A;   FOOT ARTHRODESIS  06/23/2012   Procedure: ARTHRODESIS FOOT;  Surgeon: Toni Arthurs, MD;  Location: [facility] OR;  Service: Orthopedics;  Laterality: Left;  Left Subtalar and Talonavicular Joint Revision Arthrodesis  Aspiration of Bone Marrow from Left Hip    HAIR TRANSPLANT     HARDWARE REMOVAL  06/23/2012   Procedure: HARDWARE REMOVAL;  Surgeon: Toni Arthurs, MD;  Location: [facility] OR;  Service: Orthopedics;  Laterality: Left;  Removal of Deep Implant  X's 3   I & D EXTREMITY Left 09/23/2021   Procedure: IRRIGATION AND DEBRIDEMENT OF LEG;  Surgeon: Kathryne Hitch, MD;  Location: [facility] OR;  Service: Orthopedics;  Laterality: Left;   I & D EXTREMITY Left 09/26/2021   Procedure: REPEAT IRRIGATION AND DEBRIDEMENT LEFT LEG, POSSIBLE WOUND CLOSURE, POSSIBLE VAC CHANGE, POSSIBLE SKIN GRAFT;  Surgeon: Kathryne Hitch, MD;  Location: [facility] OR;  Service: Orthopedics;  Laterality: Left;   I & D EXTREMITY Left 12/30/2021   Procedure: IRRIGATION AND DEBRIDEMENT LEFT LOWER LEG WOUND;  Surgeon: Kathryne Hitch, MD;  Location:  [facility] OR;  Service: Orthopedics;  Laterality: Left;   I & D EXTREMITY Left 01/02/2022   Procedure: LEFT LEG DEBRIDEMENT AND TISSUE GRAFT;  Surgeon: Nadara Mustard, MD;  Location[facility] OR;  Service: Orthopedics;  Laterality: Left;   INCISION AND DRAINAGE WOUND WITH FASCIOTOMY Left 09/22/2021   Procedure: INCISION AND DRAINAGE WOUND WITH FASCIOTOMY;  Surgeon: Kathryne Hitch, MD;  Location: [facility] OR;  Service: Orthopedics;  Laterality: Left;   INSERTION OF MESH N/A 07/10/2016   Procedure: INSERTION OF MESH;  Surgeon: Abigail Miyamoto, MD;  Location: [facility];  Service: General;  Laterality: N/A;   JOINT REPLACEMENT     right hip  01-2011   LEFT HEART CATH AND CORONARY ANGIOGRAPHY N/A 03/16/2019   Procedure: LEFT HEART CATH AND CORONARY ANGIOGRAPHY;  Surgeon: Tonny Bollman, MD;  Location: [facility] INVASIVE CV LAB;  Service: Cardiovascular;  Laterality: N/A;   LIMB SPARING RESECTION HIP W/ SADDLE JOINT REPLACEMENT Right    MINIMALLY INVASIVE MAZE PROCEDURE N/A 03/21/2019   Procedure: MINIMALLY INVASIVE MAZE PROCEDURE;  Surgeon: Purcell Nails, MD;  Location: [facility] OR;  Service: Open Heart Surgery;  Laterality: N/A;   MITRAL VALVE REPAIR Right 03/21/2019   Procedure: MINIMALLY INVASIVE MITRAL VALVE REPAIR (MVR) using Memo 4D ring size 36;  Surgeon: Purcell Nails, MD;  Location: [facility] OR;  Service: Open Heart Surgery;  Laterality: Right;   POLYPECTOMY  01/30/2022   Procedure: POLYPECTOMY;  Surgeon: Mansouraty, Netty Starring., MD;  Location: [facility];  Service: Gastroenterology;;   SKIN SPLIT GRAFT Left 04/10/2022   Procedure: APPLY SKIN GRAFT TO LEFT LEG WOUND;  Surgeon: Nadara Mustard, MD;  Location: [facility] OR;  Service: Orthopedics;  Laterality: Left;   TEE WITHOUT CARDIOVERSION N/A 01/03/2019   Procedure: TRANSESOPHAGEAL ECHOCARDIOGRAM (TEE);  Surgeon: Quintella Reichert, MD;  Location: [facility] ENDOSCOPY;  Service: Cardiovascular;  Laterality: N/A;   TEE WITHOUT CARDIOVERSION N/A 03/21/2019   Procedure:  TRANSESOPHAGEAL ECHOCARDIOGRAM (TEE);  Surgeon: Purcell Nails, MD;  Location: [facility] OR;  Service: Open Heart Surgery;  Laterality: N/A;   TEE WITHOUT CARDIOVERSION N/A 12/11/2022   Procedure: TRANSESOPHAGEAL ECHOCARDIOGRAM (TEE);  Surgeon: Laurey Morale, MD;  Location: [facility] ENDOSCOPY;  Service: Cardiovascular;  Laterality: N/A;   TEMPORARY PACEMAKER N/A 03/22/2019   Procedure: TEMPORARY PACEMAKER;  Surgeon: Lyn Records, MD;  Location: [facility] INVASIVE CV LAB;  Service: Cardiovascular;  Laterality: N/A;   TOTAL KNEE ARTHROPLASTY Right 01/19/2014   Procedure: RIGHT TOTAL KNEE ARTHROPLASTY, Steroid injection left knee;  Surgeon: Kathryne Hitch, MD;  Location: [facility] ORS;  Service: Orthopedics;  Laterality: Right;   TOTAL KNEE ARTHROPLASTY Left 10/23/2014   Procedure: LEFT TOTAL KNEE ARTHROPLASTY;  Surgeon: Kathryne Hitch, MD;  Location: [facility] ORS;  Service: Orthopedics;  Laterality: Left;   UMBILICAL HERNIA REPAIR N/A 07/10/2016   Procedure: UMBILICAL HERNIA REPAIR;  Surgeon: Abigail Miyamoto, MD;  Location: [facility];  Service: General;  Laterality: N/A;   Social History   Occupational History   Occupation: Teaching laboratory technician  Tobacco Use   Smoking status: Never   Smokeless tobacco: Never  Vaping Use   Vaping status: Never Used  Substance and Sexual Activity   Alcohol use: Not Currently    Alcohol/week: 21.0 standard drinks of alcohol    Types: 21 Shots of liquor per week   Drug use: No   Sexual activity: Yes

## 2023-10-25 ENCOUNTER — Other Ambulatory Visit: Payer: Self-pay | Admitting: Cardiovascular Disease

## 2023-10-25 DIAGNOSIS — I4819 Other persistent atrial fibrillation: Secondary | ICD-10-CM

## 2023-10-25 MED ORDER — WARFARIN SODIUM 5 MG PO TABS: ORAL_TABLET | ORAL | 0 refills | Status: DC

## 2023-10-29 ENCOUNTER — Other Ambulatory Visit: Payer: Self-pay

## 2023-10-29 ENCOUNTER — Emergency Department (HOSPITAL_COMMUNITY): Payer: Medicare HMO

## 2023-10-29 ENCOUNTER — Emergency Department (HOSPITAL_COMMUNITY)
Admission: EM | Admit: 2023-10-29 | Discharge: 2023-10-29 | Disposition: A | Discharge status: Home / Self Care | Payer: Medicare HMO

## 2023-10-29 DIAGNOSIS — E785 Hyperlipidemia, unspecified: Secondary | ICD-10-CM | POA: Diagnosis not present

## 2023-10-29 DIAGNOSIS — I4891 Unspecified atrial fibrillation: Secondary | ICD-10-CM | POA: Insufficient documentation

## 2023-10-29 DIAGNOSIS — I10 Essential (primary) hypertension: Secondary | ICD-10-CM | POA: Diagnosis not present

## 2023-10-29 DIAGNOSIS — S79911A Unspecified injury of right hip, initial encounter: Secondary | ICD-10-CM | POA: Diagnosis present

## 2023-10-29 DIAGNOSIS — Z79899 Other long term (current) drug therapy: Secondary | ICD-10-CM | POA: Insufficient documentation

## 2023-10-29 DIAGNOSIS — I110 Hypertensive heart disease with heart failure: Secondary | ICD-10-CM | POA: Insufficient documentation

## 2023-10-29 DIAGNOSIS — X500XXA Overexertion from strenuous movement or load, initial encounter: Secondary | ICD-10-CM | POA: Insufficient documentation

## 2023-10-29 DIAGNOSIS — Z7901 Long term (current) use of anticoagulants: Secondary | ICD-10-CM | POA: Diagnosis not present

## 2023-10-29 DIAGNOSIS — I509 Heart failure, unspecified: Secondary | ICD-10-CM | POA: Insufficient documentation

## 2023-10-29 DIAGNOSIS — M25561 Pain in right knee: Secondary | ICD-10-CM | POA: Insufficient documentation

## 2023-10-29 DIAGNOSIS — T84020A Dislocation of internal right hip prosthesis, initial encounter: Secondary | ICD-10-CM | POA: Diagnosis not present

## 2023-10-29 DIAGNOSIS — S73004A Unspecified dislocation of right hip, initial encounter: Secondary | ICD-10-CM

## 2023-10-29 LAB — BASIC METABOLIC PANEL
Anion gap: 8 (ref 5–15)
BUN: 25 mg/dL — ABNORMAL HIGH (ref 8–23)
CO2: 28 mmol/L (ref 22–32)
Calcium: 8.5 mg/dL — ABNORMAL LOW (ref 8.9–10.3)
Chloride: 97 mmol/L — ABNORMAL LOW (ref 98–111)
Creatinine, Ser: 0.99 mg/dL (ref 0.61–1.24)
GFR, Estimated: >60 mL/min (ref >60)
Glucose, Bld: 113 mg/dL — ABNORMAL HIGH (ref 70–99)
Potassium: 3.2 mmol/L — ABNORMAL LOW (ref 3.5–5.1)
Sodium: 133 mmol/L — ABNORMAL LOW (ref 135–145)

## 2023-10-29 LAB — CBC
HCT: 42 % (ref 39.0–52.0)
Hemoglobin: 13.6 g/dL (ref 13.0–17.0)
MCH: 31.9 pg (ref 26.0–34.0)
MCHC: 32.4 g/dL (ref 30.0–36.0)
MCV: 98.4 fL (ref 80.0–100.0)
Platelets: 218 10*3/uL (ref 150–400)
RBC: 4.27 MIL/uL (ref 4.22–5.81)
RDW: 12.7 % (ref 11.5–15.5)
WBC: 9.7 10*3/uL (ref 4.0–10.5)
nRBC: 0 % (ref 0.0–0.2)

## 2023-10-29 MED ORDER — ONDANSETRON HCL 4 MG/2ML IJ SOLN
4.0000 mg | Freq: Once | INTRAMUSCULAR | Status: AC
Start: 2023-10-29 — End: 2023-10-29
  Administered 2023-10-29: 4 mg via INTRAVENOUS
  Filled 2023-10-29: qty 2

## 2023-10-29 MED ORDER — FENTANYL CITRATE PF 50 MCG/ML IJ SOSY
50.0000 ug | PREFILLED_SYRINGE | Freq: Once | INTRAMUSCULAR | Status: AC
  Administered 2023-10-29: 50 ug via INTRAVENOUS
  Filled 2023-10-29: qty 1

## 2023-10-29 MED ORDER — PROPOFOL 500 MG/50ML IV EMUL
0.5000 mg/kg | Freq: Once | INTRAVENOUS | Status: AC
  Administered 2023-10-29: 200 mg via INTRAVENOUS
  Filled 2023-10-29: qty 50

## 2023-10-29 NOTE — ED Notes (Signed)
Pt declined crutches- states has at home

## 2023-10-29 NOTE — ED Provider Notes (Signed)
[name] EMERGENCY DEPARTMENT AT [facility] Provider Note   CSN: [id number] Arrival date & time: 10/29/23  1526     History  Chief Complaint  Patient presents with   Leg Pain    [name] is a 67 y.o. male.  67 year old male with past medical history of hypertension, hyperlipidemia, and atrial fibrillation on Coumadin as well as CHF with ejection fraction of 40 to 45% presenting to the emergency department today with right hip pain.  The patient states that he bent over today to pick something up off the floor when he thinks that his hip dislocated.  The patient does have a history of right hip dislocation 2 weeks ago.  Prior to that he had not had any issue for this for years.  He fell prior to the dislocation 2 weeks ago but denies any falls recently.  He denies any other injuries.  He reports that he is able to move his feet and does not have any numbness or tingling in his leg.   Leg Pain      Home Medications Prior to Admission medications   Medication Sig Start Date End Date Taking? Authorizing Provider  ALPRAZolam Prudy Feeler) 0.5 MG tablet Take 0.5 mg by mouth 2 (two) times daily. 12/16/21   [provider]  atorvastatin (LIPITOR) 20 MG tablet Take 20 mg by mouth every morning.    [provider]  bismuth subsalicylate (PEPTO BISMOL) 262 MG/15ML suspension Take 30 mLs by mouth every 6 (six) hours as needed for indigestion or diarrhea or loose stools.    [provider]  Docusate Sodium (DSS) 100 MG CAPS Take 3 capsules by mouth every morning.    [provider]  meclizine (ANTIVERT) 25 MG tablet Take 50 mg by mouth daily as needed for dizziness.    [provider]  Melatonin 10 MG TABS Take 10 mg by mouth at bedtime.    [provider]  metoprolol tartrate (LOPRESSOR) 25 MG tablet Take 25 mg by mouth daily. 06/22/23   [provider]  Multiple Vitamin (MULTIVITAMIN WITH MINERALS) TABS tablet Take 1  tablet by mouth every morning.    [provider]  OVER THE COUNTER MEDICATION Take 1 capsule by mouth daily. Brilliant Brain supplement    [provider]  oxyCODONE-acetaminophen (PERCOCET) 10-325 MG tablet Take 1-1.5 tablets by mouth every 8 (eight) hours as needed for pain. 06/16/23   [provider]  predniSONE (STERAPRED UNI-PAK 21 TAB) 10 MG (21) TBPK tablet Take by mouth as directed. 06/09/23   [provider]  sacubitril-valsartan (ENTRESTO) 49-51 MG Take 1 tablet by mouth 2 (two) times daily. 02/02/23   Laurey Morale, MD  silver sulfADIAZINE (SILVADENE) 1 % cream Apply 1 Application topically daily. Apply to affected area daily plus dry dressing Patient taking differently: Apply 1 Application topically every other day. 07/07/22   Nadara Mustard, MD  spironolactone (ALDACTONE) 25 MG tablet Take 1 tablet (25 mg total) by mouth daily. 07/29/23   Laurey Morale, MD  torsemide [facility]) 20 MG tablet TAKE 1 TABLET EVERY DAY 06/22/23   Runell Gess, MD  warfarin (COUMADIN) 5 MG tablet TAKE 1 TABLET EVERY DAY EXCEPT TAKE 1 AND 1/2 TABLETS ON WEDNESDAY AS INSTRUCTED 10/25/23   Runell Gess, MD      Allergies    Patient has no known allergies.    Review of Systems   Review of Systems  Musculoskeletal:  Right hip pain  All other systems reviewed and are negative.   Physical Exam Updated Vital Signs BP 125/76 (BP Location: Right Arm)   Pulse 85   Temp 98.2 F (36.8 C) (Oral)   Resp 20   SpO2 100%  Physical Exam Vitals and nursing note reviewed.   Gen: NAD Eyes: PERRL, EOMI HEENT: no oropharyngeal swelling Neck: trachea midline Resp: clear to auscultation bilaterally Card: RRR, no murmurs, rubs, or gallops Abd: nontender, nondistended Extremities: Tender over the right proximal femur, right leg is shortened and externally rotated Vascular: 2+ radial pulses bilaterally, 2+ DP pulses bilaterally Skin: no rashes Psyc: acting  appropriately   ED Results / Procedures / Treatments   Labs (all labs ordered are listed, but only abnormal results are displayed) Labs Reviewed  BASIC METABOLIC PANEL - Abnormal; Notable for the following components:      Result Value   Sodium 133 (*)    Potassium 3.2 (*)    Chloride 97 (*)    Glucose, Bld 113 (*)    BUN 25 (*)    Calcium 8.5 (*)    All other components within normal limits  CBC    EKG None  Radiology DG Hip Unilat W or Wo Pelvis 2-3 Views Right Result Date: 10/29/2023 CLINICAL DATA:  Postreduction EXAM: DG HIP (WITH OR WITHOUT PELVIS) one-view RIGHT COMPARISON:  X-ray earlier 10/29/2023. FINDINGS: Interval postreduction of the dislocation of the right hip arthroplasty. Press-Fit components. The tip of the femoral component is not included in the imaging field on this single view. Mild joint space loss of the left hip. Preserved bone mineralization. No additional fracture or dislocation. Imaging was obtained to aid in treatment. IMPRESSION: Postreduction Electronically Signed   By: Karen Kays M.D.   On: 10/29/2023 18:37   DG Hip Unilat W or Wo Pelvis 2-3 Views Right Result Date: 10/29/2023 CLINICAL DATA:  Hip dislocation.  Unable to straighten the leg. EXAM: DG HIP (WITH OR WITHOUT PELVIS) 2-3V RIGHT COMPARISON:  10/17/2023. FINDINGS: Redemonstration of posterosuperior dislocation of right femoral head prosthesis in respect to the acetabular prosthesis. No acute fracture. No aggressive osseous lesion. Visualized sacral arcuate lines are unremarkable. Unremarkable symphysis pubis. There are mild degenerative changes of the left hip joint characterized by mildly reduced joint space and osteophytosis of the superior acetabulum. No radiopaque foreign bodies. IMPRESSION: *Dislocated right hip arthroplasty hardware. Electronically Signed   By: Jules Schick M.D.   On: 10/29/2023 17:25    Procedures .Sedation  Date/Time: 10/29/2023 6:45 PM  Performed by: Durwin Glaze, MD Authorized by: Durwin Glaze, MD   Consent:    Consent obtained:  Verbal   Consent given by:  Patient   Risks discussed:  Allergic reaction, dysrhythmia, prolonged sedation necessitating reversal, prolonged hypoxia resulting in organ damage, respiratory compromise necessitating ventilatory assistance and intubation, vomiting, inadequate sedation and nausea   Alternatives discussed:  Analgesia without sedation and anxiolysis Universal protocol:    Immediately prior to procedure, a time out was called: yes   Indications:    Procedure performed:  Dislocation reduction   Procedure necessitating sedation performed by:  Physician performing sedation Pre-sedation assessment:    Time since last food or drink:  12PM   ASA classification: class 2 - patient with mild systemic disease     Mouth opening:  3 or more finger widths   Thyromental distance:  4 finger widths   Mallampati score:  I - soft palate, uvula, fauces, pillars visible  Neck mobility: normal     Pre-sedation assessments completed and reviewed: pre-procedure airway patency not reviewed, pre-procedure cardiovascular function not reviewed, pre-procedure hydration status not reviewed, pre-procedure mental status not reviewed, pre-procedure nausea and vomiting status not reviewed, pre-procedure pain level not reviewed, pre-procedure respiratory function not reviewed and pre-procedure temperature not reviewed     Pre-sedation assessments completed and reviewed comment:  Chronic cardiac issues well controlled on medications A pre-sedation assessment was completed prior to the start of the procedure Immediate pre-procedure details:    Reassessment: Patient reassessed immediately prior to procedure     Reviewed: vital signs, relevant labs/tests and NPO status     Verified: bag valve mask available, emergency equipment available, intubation equipment available, IV patency confirmed, oxygen available and suction available   Procedure  details (see MAR for exact dosages):    Preoxygenation:  Nasal cannula   Sedation:  Propofol   Intended level of sedation: deep   Analgesia:  Fentanyl   Intra-procedure monitoring:  Blood pressure monitoring, continuous capnometry, frequent LOC assessments, frequent vital sign checks, continuous pulse oximetry and cardiac monitor   Intra-procedure events: none     Total Provider sedation time (minutes):  16 Post-procedure details:   A post-sedation assessment was completed following the completion of the procedure.   Attendance: Constant attendance by certified staff until patient recovered     Recovery: Patient returned to pre-procedure baseline     Post-sedation assessments completed and reviewed: post-procedure airway patency not reviewed, post-procedure cardiovascular function not reviewed, post-procedure hydration status not reviewed, post-procedure mental status not reviewed, post-procedure nausea and vomiting status not reviewed, pain score not reviewed, post-procedure respiratory function not reviewed and post-procedure temperature not reviewed     Patient is stable for discharge or admission: yes     Procedure completion:  Tolerated well, no immediate complications .Ortho Injury Treatment  Date/Time: 10/29/2023 6:48 PM  Performed by: Durwin Glaze, MD Authorized by: Durwin Glaze, MD   Consent:    Consent obtained:  Verbal   Risks discussed:  Fracture, nerve damage, irreducible dislocation, recurrent dislocation, stiffness and restricted joint movement   Alternatives discussed:  No treatmentInjury location: upper leg Location details: right upper leg Injury type: dislocation Pre-procedure neurovascular assessment: neurovascularly intact Pre-procedure distal perfusion: normal Pre-procedure neurological function: normal Pre-procedure range of motion: normal Manipulation performed: yes Reduction successful: yes X-ray confirmed reduction: yes Immobilization: Knee  immobilizer. Splint Applied by: Milon Dikes Post-procedure neurovascular assessment: post-procedure neurovascularly intact Post-procedure distal perfusion: normal Post-procedure neurological function: normal Post-procedure range of motion: normal Comments: Assisted by Fayrene Helper, PA       Medications Ordered in ED Medications  fentaNYL (SUBLIMAZE) injection 50 mcg (50 mcg Intravenous Given 10/29/23 1705)  ondansetron (ZOFRAN) injection 4 mg (4 mg Intravenous Given 10/29/23 1704)  propofol (DIPRIVAN) 500 MG/50ML infusion 59 mg (0 mg Intravenous Stopped 10/29/23 1816)    ED Course/ Medical Decision Making/ A&P                                 Medical Decision Making 67 year old male with past medical history of atrial fibrillation and CHF with an ejection fraction of 40 to 45% presenting to the emergency department today with right hip pain.  Concern for recurrent dislocation.  Will obtain x-ray here.  Will obtain basic labs here in the event that this is unsuccessful and the patient requires any operative management.  The patient was successfully  sedated and his hip was reduced here in the emergency department 2 weeks ago.  If this is appear to be dislocated will attempt this again here.  He is otherwise neurovascularly intact here.  Amount and/or Complexity of Data Reviewed Labs: ordered. Radiology: ordered.  Risk Prescription drug management.           Final Clinical Impression(s) / ED Diagnoses Final diagnoses:  Closed dislocation of right hip, initial encounter [facility])    Rx / DC Orders ED Discharge Orders     None         Durwin Glaze, MD 10/29/23 1850

## 2023-10-29 NOTE — Progress Notes (Signed)
Orthopedic Tech Progress Note Patient Details:  [name] 1956-08-11 [id number]  Ortho Devices Type of Ortho Device: Knee Immobilizer Ortho Device/Splint Location: RLE Ortho Device/Splint Interventions: Application   Post Interventions Patient Tolerated: Well  [name] 10/29/2023, 6:25 PM

## 2023-10-29 NOTE — ED Triage Notes (Signed)
Pt seen for dislocation on 12/15, pt states about an hour pta he bent over and dislocated right hip.  Pt has right knee bent, states unable to straighten d/t pain  Pain 9/10

## 2023-10-29 NOTE — Discharge Instructions (Signed)
Please follow-up Monday as scheduled.  Keep your leg in the knee immobilizer until you follow-up.  Return to the ER for new or worsening symptoms.

## 2023-10-29 NOTE — Progress Notes (Signed)
Pt here for dislocated right hip. Hip put back in by ED MD. Pt has end/tidal placed,vitals remain stable there out.

## 2023-11-01 ENCOUNTER — Ambulatory Visit: Payer: Medicare HMO | Admitting: Orthopaedic Surgery

## 2023-11-01 ENCOUNTER — Other Ambulatory Visit (INDEPENDENT_AMBULATORY_CARE_PROVIDER_SITE_OTHER): Payer: Self-pay | Admitting: Orthopaedic Surgery

## 2023-11-01 NOTE — Progress Notes (Signed)
Cong is well-known to me.  We actually replaced his right hip back in 2012 secondary to severe right hip arthritis.  He has never had an issue with that hip until a few weeks ago he sustained a hard mechanical fall and the right hip dislocated.  He was taken to the emergency room and they were able to give him sedation and relocate the right hip.  Just 3 days ago he had another incident where the hip came out of place.  It is a recurrent unstable right hip at this point.  I just spoke with him on the phone.  I have reviewed all of his x-rays.  We need to set him up in the near future for revision surgery with hopefully changing his liner to a locking liner and increasing his length which will give him more tightness and offset.  I explained this to him in detail and he does wish to proceed with surgery given the fact that he has now had 2 dislocations within the last month with that right hip.  We talked about the risk and benefits of the surgery and what to expect.  He is currently in a knee immobilizer and I talked to him about keeping his toes pointing forward and not bending over or abducting his hip.  We will hopefully get him on the schedule for late next week for this revision of his right hip.  This is medically necessary at this standpoint given the recurrent dislocations and instability of the right hip.

## 2023-11-02 NOTE — Patient Instructions (Signed)
 SURGICAL WAITING ROOM VISITATION Patients having surgery or a procedure may have no more than 2 support people in the waiting area - these visitors may rotate in the visitor waiting room.   Due to an increase in RSV and influenza rates and associated hospitalizations, children ages 5 and under may not visit patients in [facility] hospitals. If the patient needs to stay at the hospital during part of their recovery, the visitor guidelines for inpatient rooms apply.  PRE-OP VISITATION  Pre-op nurse will coordinate an appropriate time for 1 support person to accompany the patient in pre-op.  This support person may not rotate.  This visitor will be contacted when the time is appropriate for the visitor to come back in the pre-op area.  Please refer to the [facility] website for the visitor guidelines for Inpatients (after your surgery is over and you are in a regular room).  You are not required to quarantine at this time prior to your surgery. However, you must do this: Hand Hygiene often Do NOT share personal items Notify your provider if you are in close contact with someone who has COVID or you develop fever 100.4 or greater, new onset of sneezing, cough, sore throat, shortness of breath or body aches.  If you test positive for Covid or have been in contact with anyone that has tested positive in the last 10 days please notify you surgeon.    Your procedure is scheduled on:  Friday  January 10 , 2025  Report to [facility] Main Entrance: [place] entrance where the [employer] is available.   Report to admitting at: 11:30    AM  Call this number if you have any questions or problems the morning of surgery [phone]  Do not eat food after Midnight the night prior to your surgery/procedure.  After Midnight you may have the following liquids until  11:00?????   AM / PM DAY OF SURGERY  Clear Liquid Diet Water  Black Coffee (sugar ok, NO MILK/CREAM OR CREAMERS)  Tea (sugar  ok, NO MILK/CREAM OR CREAMERS) regular and decaf                             Plain Jell-O  with no fruit (NO RED)                                           Fruit ices (not with fruit pulp, NO RED)                                     Popsicles (NO RED)                                                                  Juice: NO CITRUS JUICES: only apple, WHITE grape, WHITE cranberry Sports drinks like Gatorade or Powerade (NO RED)                NO ORDERS YET   The day of surgery:  Drink ONE (1) Pre-Surgery Clear Ensure at    ????  11:00    AM the morning of surgery. Drink in one sitting. Do not sip.  This drink was given to you during your hospital pre-op appointment visit. Nothing else to drink after completing the Pre-Surgery Clear Ensure or G2 : No candy, chewing gum or throat lozenges.    FOLLOW ANY ADDITIONAL PRE OP INSTRUCTIONS YOU RECEIVED FROM YOUR SURGEON'S OFFICE!!!   Oral Hygiene is also important to reduce your risk of infection.        Remember - BRUSH YOUR TEETH THE MORNING OF SURGERY WITH YOUR REGULAR TOOTHPASTE  Do NOT smoke after Midnight the night before surgery.  COUMADIN -    ????  STOP TAKING all Vitamins, Herbs and supplements 1 week before your surgery.   Take ONLY these medicines the morning of surgery with A SIP OF WATER : Metoprolol  and  Oxycodone  APAP if needed for pain.  You may take alprazolam  (Xanax ) if needed for anxiety.    If You have been diagnosed with Sleep Apnea - Bring CPAP mask and tubing day of surgery. We will provide you with a CPAP machine on the day of your surgery.                   You may not have any metal on your body including  jewelry, and body piercing  Do not wear make-up, lotions, powders, perfumes / cologne, or deodorant  Men may shave face and neck.  Contacts, Hearing Aids, dentures or bridgework may not be worn into surgery. DENTURES WILL BE REMOVED PRIOR TO SURGERY PLEASE DO NOT APPLY Poly grip OR ADHESIVES!!!  You may  bring a small overnight bag with you on the day of surgery, only pack items that are not valuable. [facility] IS NOT RESPONSIBLE   FOR VALUABLES THAT ARE LOST OR STOLEN.   Do not bring your home medications to the hospital. The Pharmacy will dispense medications listed on your medication list to you during your admission in the Hospital.  Special Instructions: Bring a copy of your healthcare power of attorney and living will documents the day of surgery, if you wish to have them scanned into your [facility]- EPIC  Please read over the following fact sheets you were given: IF YOU HAVE QUESTIONS ABOUT YOUR PRE-OP INSTRUCTIONS, PLEASE CALL [phone].     Pre-operative 5 CHG Bath Instructions   You can play a key role in reducing the risk of infection after surgery. Your skin needs to be as free of germs as possible. You can reduce the number of germs on your skin by washing with CHG (chlorhexidine  gluconate) soap before surgery. CHG is an antiseptic soap that kills germs and continues to kill germs even after washing.   DO NOT use if you have an allergy to chlorhexidine /CHG or antibacterial soaps. If your skin becomes reddened or irritated, stop using the CHG and notify one of our RNs at [phone]  Please shower with the CHG soap starting 4 days before surgery using the following schedule: START SHOWERS ON  MONDAY  November 08, 2023  Please keep in mind the following:  DO NOT shave, including legs and underarms, starting the day of your first shower.   You may shave your face at any point before/day of surgery.   Place clean sheets on your bed the day you start using CHG soap. Use a clean washcloth (not used since being washed) for each shower. DO NOT sleep with pets once you start using the CHG.    CHG Shower Instructions:  If you choose to wash your hair and private area, wash first with your normal shampoo/soap.  After you use shampoo/soap, rinse your hair and body thoroughly to remove shampoo/soap residue.  Turn the water  OFF and apply about 3 tablespoons (45 ml) of CHG soap to a CLEAN washcloth.  Apply CHG soap ONLY FROM YOUR NECK DOWN TO YOUR TOES (washing for 3-5 minutes)  DO NOT use CHG soap on face, private areas, open wounds, or sores.  Pay special attention to the area where your surgery is being performed.  If you are having back surgery, having someone wash your back for you may be helpful.  Wait 2 minutes after CHG soap is applied, then you may rinse off the CHG soap.  Pat dry with a clean towel  Put on clean clothes/pajamas   If you choose to wear lotion, please use ONLY the CHG-compatible lotions on the back of this paper.     Additional instructions for the day of surgery: DO NOT APPLY any lotions, deodorants, cologne, or perfumes.   Put on clean/comfortable clothes.  Brush your teeth.  Ask your nurse before applying any prescription medications to the skin.      CHG Compatible Lotions   Aveeno Moisturizing lotion  Cetaphil Moisturizing Cream  Cetaphil Moisturizing Lotion  Clairol Herbal Essence Moisturizing Lotion, Dry Skin  Clairol Herbal Essence Moisturizing Lotion, Extra Dry Skin  Clairol Herbal Essence Moisturizing Lotion, Normal Skin  Curel Age Defying Therapeutic Moisturizing Lotion with Alpha Hydroxy  Curel Extreme Care Body Lotion  Curel Soothing Hands Moisturizing Hand Lotion  Curel Therapeutic Moisturizing Cream, Fragrance-Free  Curel Therapeutic Moisturizing Lotion, Fragrance-Free  Curel Therapeutic Moisturizing Lotion, Original Formula  Eucerin Daily Replenishing Lotion  Eucerin Dry Skin Therapy Plus Alpha Hydroxy Crme  Eucerin Dry Skin Therapy Plus Alpha Hydroxy Lotion  Eucerin Original Crme  Eucerin Original Lotion  Eucerin Plus  Crme Eucerin Plus Lotion  Eucerin TriLipid Replenishing Lotion  Keri Anti-Bacterial Hand Lotion  Keri Deep Conditioning Original Lotion Dry Skin Formula Softly Scented  Keri Deep Conditioning Original Lotion, Fragrance Free Sensitive Skin Formula  Keri Lotion Fast Absorbing Fragrance Free Sensitive Skin Formula  Keri Lotion Fast Absorbing Softly Scented Dry Skin Formula  Keri Original Lotion  Keri Skin Renewal Lotion Keri Silky Smooth Lotion  Keri Silky Smooth Sensitive Skin Lotion  Nivea Body Creamy Conditioning Oil  Nivea Body Extra Enriched Lotion  Nivea Body Original Lotion  Nivea Body Sheer Moisturizing Lotion Nivea Crme  Nivea Skin Firming Lotion  NutraDerm 30 Skin Lotion  NutraDerm Skin Lotion  NutraDerm Therapeutic Skin Cream  NutraDerm Therapeutic Skin Lotion  ProShield Protective Hand Cream  Provon moisturizing lotion   FAILURE TO FOLLOW THESE INSTRUCTIONS MAY RESULT IN THE CANCELLATION OF YOUR SURGERY  PATIENT SIGNATURE_________________________________  NURSE SIGNATURE__________________________________  ________________________________________________________________________     [name]    An incentive spirometer is a tool that can help keep your lungs clear and active. This tool measures how well you are filling your lungs with each breath. Taking long deep  breaths may help reverse or decrease the chance of developing breathing (pulmonary) problems (especially infection) following: A long period of time when you are unable to move or be active. BEFORE THE PROCEDURE  If the spirometer includes an indicator to show your best effort, your nurse or respiratory therapist will set it to a desired goal. If possible, sit up straight or lean slightly forward. Try not to slouch. Hold the incentive spirometer in an upright position. INSTRUCTIONS FOR USE  Sit on the edge of your bed if possible, or sit up as far as you can in bed or on a chair. Hold the  incentive spirometer in an upright position. Breathe out normally. Place the mouthpiece in your mouth and seal your lips tightly around it. Breathe in slowly and as deeply as possible, raising the piston or the ball toward the top of the column. Hold your breath for 3-5 seconds or for as long as possible. Allow the piston or ball to fall to the bottom of the column. Remove the mouthpiece from your mouth and breathe out normally. Rest for a few seconds and repeat Steps 1 through 7 at least 10 times every 1-2 hours when you are awake. Take your time and take a few normal breaths between deep breaths. The spirometer may include an indicator to show your best effort. Use the indicator as a goal to work toward during each repetition. After each set of 10 deep breaths, practice coughing to be sure your lungs are clear. If you have an incision (the cut made at the time of surgery), support your incision when coughing by placing a pillow or rolled up towels firmly against it. Once you are able to get out of bed, walk around indoors and cough well. You may stop using the incentive spirometer when instructed by your caregiver.  RISKS AND COMPLICATIONS Take your time so you do not get dizzy or light-headed. If you are in pain, you may need to take or ask for pain medication before doing incentive spirometry. It is harder to take a deep breath if you are having pain. AFTER USE Rest and breathe slowly and easily. It can be helpful to keep track of a log of your progress. Your caregiver can provide you with a simple table to help with this. If you are using the spirometer at home, follow these instructions: SEEK MEDICAL CARE IF:  You are having difficultly using the spirometer. You have trouble using the spirometer as often as instructed. Your pain medication is not giving enough relief while using the spirometer. You develop fever of 100.5 F (38.1 C) or higher.                                                                                                     SEEK IMMEDIATE MEDICAL CARE IF:  You cough up bloody sputum that had not been present before. You develop fever of 102 F (38.9 C) or greater. You develop worsening pain at or near the incision site. MAKE SURE YOU:  Understand these instructions. Will watch your condition. Will get  help right away if you are not doing well or get worse. Document Released: 03/01/2007 Document Revised: 01/11/2012 Document Reviewed: 05/02/2007 [facility] Patient Information 2014 [place], [place].      WHAT IS A BLOOD TRANSFUSION? Blood Transfusion Information  A transfusion is the replacement of blood or some of its parts. Blood is made up of multiple cells which provide different functions. Red blood cells carry oxygen and are used for blood loss replacement. White blood cells fight against infection. Platelets control bleeding. Plasma helps clot blood. Other blood products are available for specialized needs, such as hemophilia or other clotting disorders. BEFORE THE TRANSFUSION  Who gives blood for transfusions?  Healthy volunteers who are fully evaluated to make sure their blood is safe. This is blood bank blood. Transfusion therapy is the safest it has ever been in the practice of medicine. Before blood is taken from a donor, a complete history is taken to make sure that person has no history of diseases nor engages in risky social behavior (examples are intravenous drug use or sexual activity with multiple partners). The donor's travel history is screened to minimize risk of transmitting infections, such as malaria. The donated blood is tested for signs of infectious diseases, such as HIV and hepatitis. The blood is then tested to be sure it is compatible with you in order to minimize the chance of a transfusion reaction. If you or a relative donates blood, this is often done in anticipation of surgery and is not appropriate for emergency situations. It  takes many days to process the donated blood. RISKS AND COMPLICATIONS Although transfusion therapy is very safe and saves many lives, the main dangers of transfusion include:  Getting an infectious disease. Developing a transfusion reaction. This is an allergic reaction to something in the blood you were given. Every precaution is taken to prevent this. The decision to have a blood transfusion has been considered carefully by your caregiver before blood is given. Blood is not given unless the benefits outweigh the risks. AFTER THE TRANSFUSION Right after receiving a blood transfusion, you will usually feel much better and more energetic. This is especially true if your red blood cells have gotten low (anemic). The transfusion raises the level of the red blood cells which carry oxygen, and this usually causes an energy increase. The nurse administering the transfusion will monitor you carefully for complications. HOME CARE INSTRUCTIONS  No special instructions are needed after a transfusion. You may find your energy is better. Speak with your caregiver about any limitations on activity for underlying diseases you may have. SEEK MEDICAL CARE IF:  Your condition is not improving after your transfusion. You develop redness or irritation at the intravenous (IV) site. SEEK IMMEDIATE MEDICAL CARE IF:  Any of the following symptoms occur over the next 12 hours: Shaking chills. You have a temperature by mouth above 102 F (38.9 C), not controlled by medicine. Chest, back, or muscle pain. People around you feel you are not acting correctly or are confused. Shortness of breath or difficulty breathing. Dizziness and fainting. You get a rash or develop hives. You have a decrease in urine output. Your urine turns a dark color or changes to pink, red, or brown. Any of the following symptoms occur over the next 10 days: You have a temperature by mouth above 102 F (38.9 C), not controlled by  medicine. Shortness of breath. Weakness after normal activity. The white part of the eye turns yellow (jaundice). You have a decrease in  the amount of urine or are urinating less often. Your urine turns a dark color or changes to pink, red, or brown. Document Released: 10/16/2000 Document Revised: 01/11/2012 Document Reviewed: 06/04/2008 [facility] Patient Information 2014 [place], [place].  _______________________________________________________________________

## 2023-11-02 NOTE — Progress Notes (Signed)
 COVID Vaccine received:  []  No [x]  Yes Date of any COVID positive Test in last 90 days:  PCP - Alm Bilis,  MD at [facility]. [phone] (Work) [phone] (Fax)  Cardiologist - Dorn Lesches, MD  EP- Eulas Furbish , MD  Chest x-ray - 10-17-2023  1v  Epic EKG -  10-29-2023  Epic Stress Test -  ECHO - 12-11-2022  Epic Cardiac Cath - 03-16-2019  LHC  by Dr. Wonda Ablation for A.fib 07-15-2023  PCR screen: [x]  Ordered & Completed []   No Order but Needs PROFEND     []   N/A for this surgery  Surgery Plan:  []  Ambulatory   []  Outpatient in bed  [x]  Admit Anesthesia:    []  General  []  Spinal  [x]   Choice []   MAC  Pacemaker / ICD device [x]  No []  Yes   Spinal Cord Stimulator:[x]  No []  Yes       History of Sleep Apnea? [x]  No []  Yes   CPAP used?- [x]  No []  Yes    Does the patient monitor blood sugar?   [x]  N/A   []  No []  Yes  Patient has: [x]  NO Hx DM   []  Pre-DM   []  DM1  []   DM2 Last A1c was:  normal 5.4 on   01-29-22    Blood Thinner / Instructions: COUMADIN , ? Hold and clearance ?  Next appt w/ Coumadin  Clinic at [facility] is not until 11-25-23 as of now. Sent IB msg to Dr. Vernetta, Bertrum and Julianna Sink.  Aspirin  Instructions: none  ERAS Protocol Ordered: []  No  []  Yes PRE-SURGERY []  ENSURE  []  G2   []  No Drink Ordered  Patient is to be NPO after:   NO ORDERS, msg to [employer]  Dental hx: []  Dentures:  []  N/A      []  Bridge or Partial:                   []  Loose or Damaged teeth:   Comments: Patient was given the 5 CHG shower / bath instructions for THA revision surgery along with 2 bottles of the CHG soap. Patient will start this on: 11-08-2023  All questions were asked and answered, Patient voiced understanding of this process.   Activity level: Patient is able / unable to climb a flight of stairs without difficulty; []  No CP  []  No SOB, but would have ___   Patient can / can not perform ADLs without assistance.   Anesthesia review: CHF, s/p MVR- MAZE  procedure 03-21-2023 by Dr. Quin, s/p A.fib -ablation 07-15-2023. Had several cardioversions,  HTN,  remote history of Seizure- 30 years ago related to ETOH abuse.   Patient denies shortness of breath, fever, cough and chest pain at PAT appointment.  Patient verbalized understanding and agreement to the Pre-Surgical Instructions that were given to them at this PAT appointment. Patient was also educated of the need to review these PAT instructions again prior to his surgery.I reviewed the appropriate phone numbers to call if they have any and questions or concerns.

## 2023-11-04 ENCOUNTER — Encounter (HOSPITAL_COMMUNITY)
Admission: RE | Admit: 2023-11-04 | Discharge: 2023-11-04 | Disposition: A | Discharge status: Home / Self Care | Payer: Medicare HMO | Source: Ambulatory Visit | Attending: Anesthesiology | Admitting: Anesthesiology

## 2023-11-04 DIAGNOSIS — I5023 Acute on chronic systolic (congestive) heart failure: Secondary | ICD-10-CM

## 2023-11-04 DIAGNOSIS — Z01818 Encounter for other preprocedural examination: Secondary | ICD-10-CM

## 2023-11-04 DIAGNOSIS — Z7901 Long term (current) use of anticoagulants: Secondary | ICD-10-CM

## 2023-11-04 DIAGNOSIS — I4811 Longstanding persistent atrial fibrillation: Secondary | ICD-10-CM

## 2023-11-04 DIAGNOSIS — I420 Dilated cardiomyopathy: Secondary | ICD-10-CM

## 2023-11-04 NOTE — Progress Notes (Signed)
 Copied from IB message regarding [name]:   [name], This patient did not come in this morning for his PST appt. I called him and he said that he was not aware of it and he was too busy at his garage to do a phone call visit. He is going to call back for another PST appt. He did say that you had called him about the Coumadin ; thanks for doing that. Just wanted you to be in the loop with this patient.   [place]  ===View-only below this line=== ----- Message ----- From: Vernetta Lonni GRADE, MD Sent: 11/02/2023   4:03 PM EST To: Neville MARLA Aloe, RN Subject: RE: Cardio clearance?  Coumadin  Hold? after *  Thanks for myotomy about the blood thinners.  I will call him and have him stop his Coumadin  5 days before surgery.  I was not really looking to obtain cardiac clearance given the fact that he is now dislocated that hip twice in a span of 2 weeks and he really needs to have this surgery performed soon before his hip comes out again.  I will ask him about blood thinning medication when I called him in just a minute.  He may be off it if his A-fib ablation went well. ----- Message ----- From: Aloe Neville MARLA, RN Sent: 11/02/2023   3:11 PM EST To: Lonni GRADE Vernetta, MD; * Subject: Cardio clearance?  Coumadin  Hold? after abla*  Hey, just wanted to see if you have any cardiac clearance or Coumadin  hold clearance on this patient? He had an A.fib ablation 07-15-2023 by Dr. Nancey and I didn't see any notes in EPIC about his Coumadin .  I am seeing him on Thursday at 0800 for his PST interview for his hip revision surgery on 11-11-2022. Thanks for your help,   [place] Aloe, BSN, CVRN-BC   Pre-Surgical Testing Nurse [facility]  [phone]

## 2023-11-04 NOTE — Patient Instructions (Addendum)
 SURGICAL WAITING ROOM VISITATION Patients having surgery or a procedure may have no more than 2 support people in the waiting area - these visitors may rotate.    Children under the age of 20 must have an adult with them who is not the patient.  If the patient needs to stay at the hospital during part of their recovery, the visitor guidelines for inpatient rooms apply. Pre-op nurse will coordinate an appropriate time for 1 support person to accompany patient in pre-op.  This support person may not rotate.    Please refer to the [facility] website for the visitor guidelines for Inpatients (after your surgery is over and you are in a regular room).       Your procedure is scheduled on: 11-12-23   Report to [facility] Main Entrance    Report to admitting at 11:30 AM   Call this number if you have problems the morning of surgery [phone]   Do not eat food :After Midnight.   After Midnight you may have the following liquids until 11:00 AM DAY OF SURGERY  Water  Non-Citrus Juices (without pulp, NO RED-Apple, White grape, White cranberry) Black Coffee (NO MILK/CREAM OR CREAMERS, sugar ok)  Clear Tea (NO MILK/CREAM OR CREAMERS, sugar ok) regular and decaf                             Plain Jell-O (NO RED)                                           Fruit ices (not with fruit pulp, NO RED)                                     Popsicles (NO RED)                                                               Sports drinks like Gatorade (NO RED)                   The day of surgery:  Drink ONE (1) Pre-Surgery Clear Ensure by 11:00 AM the morning of surgery. Drink in one sitting. Do not sip.  This drink was given to you during your hospital  pre-op appointment visit. Nothing else to drink after completing the Pre-Surgery Clear Ensure.          If you have questions, please contact your surgeon's office.   FOLLOW ANY ADDITIONAL PRE OP INSTRUCTIONS YOU RECEIVED FROM YOUR  SURGEON'S OFFICE!!!     Oral Hygiene is also important to reduce your risk of infection.                                    Remember - BRUSH YOUR TEETH THE MORNING OF SURGERY WITH YOUR REGULAR TOOTHPASTE   Do NOT smoke after Midnight   Take these medicines the morning of surgery with A SIP OF WATER :   Atorvastatin   Meclizine   Metoprolol   If needed Oxycodone , Alprazolam    Coumadin  - Hold starting 11-06-23   Stop all vitamins and herbal supplements 7 days before surgery                              You may not have any metal on your body including jewelry, and body piercing             Do not wear lotions, powders, cologne, or deodorant              Men may shave face and neck.   Do not bring valuables to the hospital. [facility] IS NOT RESPONSIBLE   FOR VALUABLES.   Contacts, dentures or bridgework may not be worn into surgery.   Bring small overnight bag day of surgery.   DO NOT BRING YOUR HOME MEDICATIONS TO THE HOSPITAL. PHARMACY WILL DISPENSE MEDICATIONS LISTED ON YOUR MEDICATION LIST TO YOU DURING YOUR ADMISSION IN THE HOSPITAL!              Please read over the following fact sheets you were given: IF YOU HAVE QUESTIONS ABOUT YOUR PRE-OP INSTRUCTIONS PLEASE CALL [phone] Gwen  If you received a COVID test during your pre-op visit  it is requested that you wear a mask when out in public, stay away from anyone that may not be feeling well and notify your surgeon if you develop symptoms. If you test positive for Covid or have been in contact with anyone that has tested positive in the last 10 days please notify you surgeon.    Pre-operative 5 CHG Bath Instructions   You can play a key role in reducing the risk of infection after surgery. Your skin needs to be as free of germs as possible. You can reduce the number of germs on your skin by washing with CHG (chlorhexidine  gluconate) soap before surgery. CHG is an antiseptic soap that kills germs and continues to kill germs  even after washing.   DO NOT use if you have an allergy to chlorhexidine /CHG or antibacterial soaps. If your skin becomes reddened or irritated, stop using the CHG and notify one of our RNs at [phone].   Please shower with the CHG soap starting 4 days before surgery using the following schedule:     Please keep in mind the following:  DO NOT shave, including legs and underarms, starting the day of your first shower.   You may shave your face at any point before/day of surgery.  Place clean sheets on your bed the day you start using CHG soap. Use a clean washcloth (not used since being washed) for each shower. DO NOT sleep with pets once you start using the CHG.   CHG Shower Instructions:  If you choose to wash your hair and private area, wash first with your normal shampoo/soap.  After you use shampoo/soap, rinse your hair and body thoroughly to remove shampoo/soap residue.  Turn the water  OFF and apply about 3 tablespoons (45 ml) of CHG soap to a CLEAN washcloth.  Apply CHG soap ONLY FROM YOUR NECK DOWN TO YOUR TOES (washing for 3-5 minutes)  DO NOT use CHG soap on face, private areas, open wounds, or sores.  Pay special attention to the area where your surgery is being performed.  If you are having back surgery, having someone wash your back for you may be helpful. Wait 2 minutes after CHG soap is applied, then you may rinse  off the CHG soap.  Pat dry with a clean towel  Put on clean clothes/pajamas   If you choose to wear lotion, please use ONLY the CHG-compatible lotions on the back of this paper.     Additional instructions for the day of surgery: DO NOT APPLY any lotions, deodorants, cologne, or perfumes.   Put on clean/comfortable clothes.  Brush your teeth.  Ask your nurse before applying any prescription medications to the skin.      CHG Compatible Lotions   Aveeno Moisturizing lotion  Cetaphil Moisturizing Cream  Cetaphil Moisturizing Lotion  Clairol Herbal  Essence Moisturizing Lotion, Dry Skin  Clairol Herbal Essence Moisturizing Lotion, Extra Dry Skin  Clairol Herbal Essence Moisturizing Lotion, Normal Skin  Curel Age Defying Therapeutic Moisturizing Lotion with Alpha Hydroxy  Curel Extreme Care Body Lotion  Curel Soothing Hands Moisturizing Hand Lotion  Curel Therapeutic Moisturizing Cream, Fragrance-Free  Curel Therapeutic Moisturizing Lotion, Fragrance-Free  Curel Therapeutic Moisturizing Lotion, Original Formula  Eucerin Daily Replenishing Lotion  Eucerin Dry Skin Therapy Plus Alpha Hydroxy Crme  Eucerin Dry Skin Therapy Plus Alpha Hydroxy Lotion  Eucerin Original Crme  Eucerin Original Lotion  Eucerin Plus Crme Eucerin Plus Lotion  Eucerin TriLipid Replenishing Lotion  Keri Anti-Bacterial Hand Lotion  Keri Deep Conditioning Original Lotion Dry Skin Formula Softly Scented  Keri Deep Conditioning Original Lotion, Fragrance Free Sensitive Skin Formula  Keri Lotion Fast Absorbing Fragrance Free Sensitive Skin Formula  Keri Lotion Fast Absorbing Softly Scented Dry Skin Formula  Keri Original Lotion  Keri Skin Renewal Lotion Keri Silky Smooth Lotion  Keri Silky Smooth Sensitive Skin Lotion  Nivea Body Creamy Conditioning Oil  Nivea Body Extra Enriched Lotion  Nivea Body Original Lotion  Nivea Body Sheer Moisturizing Lotion Nivea Crme  Nivea Skin Firming Lotion  NutraDerm 30 Skin Lotion  NutraDerm Skin Lotion  NutraDerm Therapeutic Skin Cream  NutraDerm Therapeutic Skin Lotion  ProShield Protective Hand Cream  Provon moisturizing lotion   PATIENT SIGNATURE_________________________________  NURSE SIGNATURE__________________________________  ________________________________________________________________________    Nasario Exon  An incentive spirometer is a tool that can help keep your lungs clear and active. This tool measures how well you are filling your lungs with each breath. Taking long deep breaths may help  reverse or decrease the chance of developing breathing (pulmonary) problems (especially infection) following: A long period of time when you are unable to move or be active. BEFORE THE PROCEDURE  If the spirometer includes an indicator to show your best effort, your nurse or respiratory therapist will set it to a desired goal. If possible, sit up straight or lean slightly forward. Try not to slouch. Hold the incentive spirometer in an upright position. INSTRUCTIONS FOR USE  Sit on the edge of your bed if possible, or sit up as far as you can in bed or on a chair. Hold the incentive spirometer in an upright position. Breathe out normally. Place the mouthpiece in your mouth and seal your lips tightly around it. Breathe in slowly and as deeply as possible, raising the piston or the ball toward the top of the column. Hold your breath for 3-5 seconds or for as long as possible. Allow the piston or ball to fall to the bottom of the column. Remove the mouthpiece from your mouth and breathe out normally. Rest for a few seconds and repeat Steps 1 through 7 at least 10 times every 1-2 hours when you are awake. Take your time and take a few normal breaths between deep breaths.  The spirometer may include an indicator to show your best effort. Use the indicator as a goal to work toward during each repetition. After each set of 10 deep breaths, practice coughing to be sure your lungs are clear. If you have an incision (the cut made at the time of surgery), support your incision when coughing by placing a pillow or rolled up towels firmly against it. Once you are able to get out of bed, walk around indoors and cough well. You may stop using the incentive spirometer when instructed by your caregiver.  RISKS AND COMPLICATIONS Take your time so you do not get dizzy or light-headed. If you are in pain, you may need to take or ask for pain medication before doing incentive spirometry. It is harder to take a deep  breath if you are having pain. AFTER USE Rest and breathe slowly and easily. It can be helpful to keep track of a log of your progress. Your caregiver can provide you with a simple table to help with this. If you are using the spirometer at home, follow these instructions: SEEK MEDICAL CARE IF:  You are having difficultly using the spirometer. You have trouble using the spirometer as often as instructed. Your pain medication is not giving enough relief while using the spirometer. You develop fever of 100.5 F (38.1 C) or higher. SEEK IMMEDIATE MEDICAL CARE IF:  You cough up bloody sputum that had not been present before. You develop fever of 102 F (38.9 C) or greater. You develop worsening pain at or near the incision site. MAKE SURE YOU:  Understand these instructions. Will watch your condition. Will get help right away if you are not doing well or get worse. Document Released: 03/01/2007 Document Revised: 01/11/2012 Document Reviewed: 05/02/2007 Exit[employer] Patient Information 2014 [employer], [place].   ________________________________________________________________________ WHAT IS A BLOOD TRANSFUSION? Blood Transfusion Information  A transfusion is the replacement of blood or some of its parts. Blood is made up of multiple cells which provide different functions. Red blood cells carry oxygen and are used for blood loss replacement. White blood cells fight against infection. Platelets control bleeding. Plasma helps clot blood. Other blood products are available for specialized needs, such as hemophilia or other clotting disorders. BEFORE THE TRANSFUSION  Who gives blood for transfusions?  Healthy volunteers who are fully evaluated to make sure their blood is safe. This is blood bank blood. Transfusion therapy is the safest it has ever been in the practice of medicine. Before blood is taken from a donor, a complete history is taken to make sure that person has no history of diseases  nor engages in risky social behavior (examples are intravenous drug use or sexual activity with multiple partners). The donor's travel history is screened to minimize risk of transmitting infections, such as malaria. The donated blood is tested for signs of infectious diseases, such as HIV and hepatitis. The blood is then tested to be sure it is compatible with you in order to minimize the chance of a transfusion reaction. If you or a relative donates blood, this is often done in anticipation of surgery and is not appropriate for emergency situations. It takes many days to process the donated blood. RISKS AND COMPLICATIONS Although transfusion therapy is very safe and saves many lives, the main dangers of transfusion include:  Getting an infectious disease. Developing a transfusion reaction. This is an allergic reaction to something in the blood you were given. Every precaution is taken to prevent this. The decision  to have a blood transfusion has been considered carefully by your caregiver before blood is given. Blood is not given unless the benefits outweigh the risks. AFTER THE TRANSFUSION Right after receiving a blood transfusion, you will usually feel much better and more energetic. This is especially true if your red blood cells have gotten low (anemic). The transfusion raises the level of the red blood cells which carry oxygen, and this usually causes an energy increase. The nurse administering the transfusion will monitor you carefully for complications. HOME CARE INSTRUCTIONS  No special instructions are needed after a transfusion. You may find your energy is better. Speak with your caregiver about any limitations on activity for underlying diseases you may have. SEEK MEDICAL CARE IF:  Your condition is not improving after your transfusion. You develop redness or irritation at the intravenous (IV) site. SEEK IMMEDIATE MEDICAL CARE IF:  Any of the following symptoms occur over the next 12  hours: Shaking chills. You have a temperature by mouth above 102 F (38.9 C), not controlled by medicine. Chest, back, or muscle pain. People around you feel you are not acting correctly or are confused. Shortness of breath or difficulty breathing. Dizziness and fainting. You get a rash or develop hives. You have a decrease in urine output. Your urine turns a dark color or changes to pink, red, or brown. Any of the following symptoms occur over the next 10 days: You have a temperature by mouth above 102 F (38.9 C), not controlled by medicine. Shortness of breath. Weakness after normal activity. The white part of the eye turns yellow (jaundice). You have a decrease in the amount of urine or are urinating less often. Your urine turns a dark color or changes to pink, red, or brown. Document Released: 10/16/2000 Document Revised: 01/11/2012 Document Reviewed: 06/04/2008 [facility] Patient Information 2014 [place], [place].  _______________________________________________________________________

## 2023-11-04 NOTE — Progress Notes (Addendum)
 COVID Vaccine Completed:  Yes  Date of COVID positive in last 90 days:  No  PCP - Alm Bilis, MD Cardiologist - Dorn Lesches, MD EP - Eulas Furbish, MD  Chest x-ray - 1 V 10-17-23 Epic EKG - 10-29-23 Epic Stress Test - N/A ECHO - 12-11-22 Epic  Cardiac Cath - 03-16-19 Pacemaker/ICD device last checked: Spinal Cord Stimulator: Afib Ablation - 07-15-23 Epic Cardiac CT - 07-08-23 Epic  Bowel Prep - - N/A  Sleep Study - - N/A CPAP -   Fasting Blood Sugar - - N/A Checks Blood Sugar _____ times a day  Last dose of GLP1 agonist-  N/A GLP1 instructions:  Hold 7 days before surgery    Last dose of SGLT-2 inhibitors-  N/A SGLT-2 instructions:  Hold 3 days before surgery   Blood Thinner Instructions:  Coumadin .  Per patient Dr. Vernetta advised him to take last dose on 11-06-23.   Aspirin  Instructions: Last Dose:  Activity level:  Can go up a flight of stairs and perform activities of daily living without stopping and without symptoms of chest pain or shortness of breath.  Anesthesia review:  Afib, cardiomyopathy, MVP, CHF, HTN. S/P Maze operation, mitral valve repair.  Hx of seizures 30 years ago  Patient denies shortness of breath, fever, cough and chest pain at PAT appointment  Patient verbalized understanding of instructions that were given to them at the PAT appointment. Patient was also instructed that they will need to review over the PAT instructions again at home before surgery.

## 2023-11-05 ENCOUNTER — Other Ambulatory Visit: Payer: Self-pay

## 2023-11-05 ENCOUNTER — Encounter (HOSPITAL_COMMUNITY): Payer: Self-pay

## 2023-11-05 ENCOUNTER — Encounter (HOSPITAL_COMMUNITY)
Admission: RE | Admit: 2023-11-05 | Discharge: 2023-11-05 | Disposition: A | Discharge status: Home / Self Care | Payer: Medicare Other | Source: Ambulatory Visit | Attending: Orthopaedic Surgery | Admitting: Orthopaedic Surgery

## 2023-11-05 VITALS — BP 124/75 | HR 64 | Temp 98.0°F | Resp 16 | Ht 75.0 in | Wt 265.0 lb

## 2023-11-05 DIAGNOSIS — Z7901 Long term (current) use of anticoagulants: Secondary | ICD-10-CM | POA: Insufficient documentation

## 2023-11-05 DIAGNOSIS — I420 Dilated cardiomyopathy: Secondary | ICD-10-CM | POA: Insufficient documentation

## 2023-11-05 DIAGNOSIS — Z01812 Encounter for preprocedural laboratory examination: Secondary | ICD-10-CM | POA: Diagnosis present

## 2023-11-05 DIAGNOSIS — Z01818 Encounter for other preprocedural examination: Secondary | ICD-10-CM

## 2023-11-05 LAB — CBC
HCT: 44.7 % (ref 39.0–52.0)
Hemoglobin: 14.2 g/dL (ref 13.0–17.0)
MCH: 31.5 pg (ref 26.0–34.0)
MCHC: 31.8 g/dL (ref 30.0–36.0)
MCV: 99.1 fL (ref 80.0–100.0)
Platelets: 271 10*3/uL (ref 150–400)
RBC: 4.51 MIL/uL (ref 4.22–5.81)
RDW: 12.5 % (ref 11.5–15.5)
WBC: 7.1 10*3/uL (ref 4.0–10.5)
nRBC: 0 % (ref 0.0–0.2)

## 2023-11-05 LAB — BASIC METABOLIC PANEL
Anion gap: 8 (ref 5–15)
BUN: 28 mg/dL — ABNORMAL HIGH (ref 8–23)
CO2: 28 mmol/L (ref 22–32)
Calcium: 8.6 mg/dL — ABNORMAL LOW (ref 8.9–10.3)
Chloride: 102 mmol/L (ref 98–111)
Creatinine, Ser: 1.01 mg/dL (ref 0.61–1.24)
GFR, Estimated: >60 mL/min (ref >60)
Glucose, Bld: 100 mg/dL — ABNORMAL HIGH (ref 70–99)
Potassium: 4.1 mmol/L (ref 3.5–5.1)
Sodium: 138 mmol/L (ref 135–145)

## 2023-11-05 LAB — SURGICAL PCR SCREEN
MRSA, PCR: NEGATIVE
Staphylococcus aureus: NEGATIVE

## 2023-11-08 NOTE — Progress Notes (Signed)
 Anesthesia Chart Review   Case: [id number] Date/Time: 11/12/23 1345   Procedure: REVISION OF RIGHT TOTAL HIP ARTHROPLASTY (Right: Hip)   Anesthesia type: Choice   Pre-op diagnosis: Recurrent Dislocation Right Total Hip   Location: [facility] ROOM 10 / [facility]   Surgeons: Vernetta Lonni GRADE, MD       DISCUSSION:67 y.o. never smoker with h/o HTN, significant for CHFrEF (35-40%), severe MR s/p minimally invasive MV repair with MAZE and LA clip in 2020, recurrent dislocation right hip scheduled for above procedure 11/12/2023 with Dr. Lonni   Per cardiology preoperative evaluation 11/10/2023, Chart reviewed as part of pre-operative protocol coverage. According to the RCRI, patient has a 0.9% risk of MACE. 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    Given past medical history and time since last visit, based on ACC/AHA guidelines, [name] would be at acceptable risk for the planned procedure without further cardiovascular testing.    Patient was advised that if he develops new symptoms prior to surgery to contact our office to arrange a follow-up appointment.  he verbalized understanding.   Per Pharm D, patient may hold warfarin for 5 days prior to procedure.  Patient will not need bridging with Lovenox  (enoxaparin ) around procedure.  Pt reports last dose of Coumadin  11/06/2023.  VS: BP 124/75   Pulse 64   Temp 36.7 C (Oral)   Resp 16   Ht 6' 3 (1.905 m)   Wt 120.2 kg   SpO2 98%   BMI 33.12 kg/m   PROVIDERS: Pura Lenis, MD is PCP   Cardiologist - Dorn Lesches, MD  EP - Eulas Furbish, MD LABS: Labs reviewed: Acceptable for surgery. (all labs ordered are listed, but only abnormal results are displayed)  Labs Reviewed  BASIC METABOLIC PANEL - Abnormal; Notable for the following components:      Result Value   Glucose, Bld 100 (*)    BUN 28 (*)    Calcium  8.6 (*)    All other components within normal  limits  SURGICAL PCR SCREEN  CBC  TYPE AND SCREEN     IMAGES:   EKG:   CV: Echo 12/11/2022  1. Left ventricular ejection fraction, by estimation, is 40 to 45%. The  left ventricle has mildly decreased function. The left ventricle  demonstrates global hypokinesis. There is mild concentric left ventricular  hypertrophy.   2. Right ventricular systolic function is mildly reduced. The right  ventricular size is normal.   3. The atrial appendage has been surgically clipped, no thrombus in  residual appendage or in LA. Left atrial size was mildly dilated.   4. The mitral valve has been repaired. Mild mitral valve regurgitation.  No evidence of mitral stenosis. The mean mitral valve gradient is 2.0  mmHg. There is a prosthetic annuloplasty ring present in the mitral  position. Procedure Date: 03/21/19.   5. The aortic valve is tricuspid. Aortic valve regurgitation is not  visualized. No aortic stenosis is present.   6. No ASD or PFO by color doppler.   Past Medical History:  Diagnosis Date   Acute on chronic systolic (congestive) heart failure (HCC) 12/30/2018   Allergy    Anxiety    Arthritis    Atrial flutter with rapid ventricular response (HCC)    Chronic systolic (congestive) heart failure (HCC)    COVID 10/2021   mild   Dilated aortic root (HCC) 01/04/2019   Dilated cardiomyopathy (HCC)  01/04/2019   Dysrhythmia    Heart murmur    History of blood transfusion    01/27/22   History of colon polyps    Hypertension    Insomnia    Mitral valve prolapse    Mitral valve regurgitation    Persistent atrial fibrillation (HCC) 12/02/2018   S/P Maze operation for atrial fibrillation 03/21/2019   Complete bilateral atrial lesion set using cryothermy and bipolar radiofrequency ablation with clipping of LA appendage via right mini thoracotomy approach   S/P minimally invasive mitral valve repair 03/21/2019   Complex valvuloplasty including triangular resection of posterior  leaflet, artificial Gore-tex neochord placement x4 and 36 mm Sorin Memo 4D ring annuloplasty via right mini thoracotomy approach   Seizures (HCC)    had one approx. 30 yrs ago,has not had any since; due to alchol and no sleep   Severe mitral regurgitation     Past Surgical History:  Procedure Laterality Date   ANKLE FUSION  left   Dec. 2012   APPLICATION OF WOUND VAC Left 09/22/2021   Procedure: APPLICATION OF WOUND VAC;  Surgeon: Vernetta Lonni GRADE, MD;  [facility];  Service: Orthopedics;  Laterality: Left;   APPLICATION OF WOUND VAC Left 12/30/2021   Procedure: APPLICATION OF WOUND VAC;  Surgeon: Vernetta Lonni GRADE, MD;  [facility];  Service: Orthopedics;  Laterality: Left;   APPLICATION OF WOUND VAC Left 04/10/2022   Procedure: APPLICATION OF WOUND VAC;  Surgeon: Harden Jerona GAILS, MD;  [facility];  Service: Orthopedics;  Laterality: Left;   ARTHROSCOPY KNEE W/ DRILLING  bilateral   2012   ATRIAL FIBRILLATION ABLATION N/A 07/15/2023   Procedure: ATRIAL FIBRILLATION ABLATION;  Surgeon: Nancey Eulas BRAVO, MD;  [facility];  Service: Cardiovascular;  Laterality: N/A;   BIOPSY  01/30/2022   Procedure: BIOPSY;  Surgeon: Wilhelmenia Aloha Raddle., MD;  Location: [facility];  Service: Gastroenterology;;   CARDIAC VALVE REPLACEMENT     CARDIOVERSION N/A 01/03/2019   Procedure: CARDIOVERSION;  Surgeon: Shlomo Wilbert SAUNDERS, MD;  Location: [facility] ENDOSCOPY;  Service: Cardiovascular;  Laterality: N/A;   CARDIOVERSION N/A 06/23/2019   Procedure: CARDIOVERSION;  Surgeon: Okey Vina GAILS, MD;  Location: [facility] ENDOSCOPY;  Service: Cardiovascular;  Laterality: N/A;   CARDIOVERSION N/A 12/11/2022   Procedure: CARDIOVERSION;  Surgeon: Rolan Ezra RAMAN, MD;  Location: [facility] ENDOSCOPY;  Service: Cardiovascular;  Laterality: N/A;   CLIPPING OF ATRIAL APPENDAGE  03/21/2019   Procedure: Clipping Of Atrial Appendage using 45mm Atricure Pro2 Clip;  Surgeon: Dusty Sudie DEL, MD;  Location: [facility] OR;   Service: Open Heart Surgery;;   COLONOSCOPY     x2   COLONOSCOPY N/A 01/30/2022   Procedure: COLONOSCOPY;  Surgeon: Wilhelmenia Aloha Raddle., MD;  [facility];  Service: Gastroenterology;  Laterality: N/A;   ESOPHAGOGASTRODUODENOSCOPY N/A 01/30/2022   Procedure: ESOPHAGOGASTRODUODENOSCOPY (EGD);  Surgeon: Wilhelmenia Aloha Raddle., MD;  Location: [facility];  Service: Gastroenterology;  Laterality: N/A;   FOOT ARTHRODESIS  06/23/2012   Procedure: ARTHRODESIS FOOT;  Surgeon: Norleen Armor, MD;  Location: [facility] OR;  Service: Orthopedics;  Laterality: Left;  Left Subtalar and Talonavicular Joint Revision Arthrodesis  Aspiration of Bone Marrow from Left Hip    HAIR TRANSPLANT     HARDWARE REMOVAL  06/23/2012   Procedure: HARDWARE REMOVAL;  Surgeon: Norleen Armor, MD;  Location: [facility] OR;  Service: Orthopedics;  Laterality: Left;  Removal of Deep Implant  X's 3   I & D EXTREMITY Left 09/23/2021  Procedure: IRRIGATION AND DEBRIDEMENT OF LEG;  Surgeon: Vernetta Lonni GRADE, MD;  Location: [facility] OR;  Service: Orthopedics;  Laterality: Left;   I & D EXTREMITY Left 09/26/2021   Procedure: REPEAT IRRIGATION AND DEBRIDEMENT LEFT LEG, POSSIBLE WOUND CLOSURE, POSSIBLE VAC CHANGE, POSSIBLE SKIN GRAFT;  Surgeon: Vernetta Lonni GRADE, MD;  [facility];  Service: Orthopedics;  Laterality: Left;   I & D EXTREMITY Left 12/30/2021   Procedure: IRRIGATION AND DEBRIDEMENT LEFT LOWER LEG WOUND;  Surgeon: Vernetta Lonni GRADE, MD;  [facility];  Service: Orthopedics;  Laterality: Left;   I & D EXTREMITY Left 01/02/2022   Procedure: LEFT LEG DEBRIDEMENT AND TISSUE GRAFT;  Surgeon: Harden Jerona GAILS, MD;  Location: [facility] OR;  Service: Orthopedics;  Laterality: Left;   INCISION AND DRAINAGE WOUND WITH FASCIOTOMY Left 09/22/2021   Procedure: INCISION AND DRAINAGE WOUND WITH FASCIOTOMY;  Surgeon: Vernetta Lonni GRADE, MD;  [facility];  Service: Orthopedics;  Laterality: Left;   INSERTION OF MESH N/A 07/10/2016    Procedure: INSERTION OF MESH;  Surgeon: Vicenta Vernetta, MD;  Location: [facility];  Service: General;  Laterality: N/A;   JOINT REPLACEMENT     right hip  01-2011   LEFT HEART CATH AND CORONARY ANGIOGRAPHY N/A 03/16/2019   Procedure: LEFT HEART CATH AND CORONARY ANGIOGRAPHY;  Surgeon: Wonda Sharper, MD;  Location: [facility] INVASIVE CV LAB;  Service: Cardiovascular;  Laterality: N/A;   LIMB SPARING RESECTION HIP W/ SADDLE JOINT REPLACEMENT Right    MINIMALLY INVASIVE MAZE PROCEDURE N/A 03/21/2019   Procedure: MINIMALLY INVASIVE MAZE PROCEDURE;  Surgeon: Dusty Sudie DEL, MD;  Location: [facility] OR;  Service: Open Heart Surgery;  Laterality: N/A;   MITRAL VALVE REPAIR Right 03/21/2019   Procedure: MINIMALLY INVASIVE MITRAL VALVE REPAIR (MVR) using Memo 4D ring size 36;  Surgeon: Dusty Sudie DEL, MD;  [facility];  Service: Open Heart Surgery;  Laterality: Right;   POLYPECTOMY  01/30/2022   Procedure: POLYPECTOMY;  Surgeon: Mansouraty, Aloha Raddle., MD;  Location: [facility];  Service: Gastroenterology;;   SKIN SPLIT GRAFT Left 04/10/2022   Procedure: APPLY SKIN GRAFT TO LEFT LEG WOUND;  Surgeon: Harden Jerona GAILS, MD;  Location: [facility] OR;  Service: Orthopedics;  Laterality: Left;   TEE WITHOUT CARDIOVERSION N/A 01/03/2019   Procedure: TRANSESOPHAGEAL ECHOCARDIOGRAM (TEE);  Surgeon: Shlomo Wilbert SAUNDERS, MD;  Location: [facility] ENDOSCOPY;  Service: Cardiovascular;  Laterality: N/A;   TEE WITHOUT CARDIOVERSION N/A 03/21/2019   Procedure: TRANSESOPHAGEAL ECHOCARDIOGRAM (TEE);  Surgeon: Dusty Sudie DEL, MD;  Location: [facility] OR;  Service: Open Heart Surgery;  Laterality: N/A;   TEE WITHOUT CARDIOVERSION N/A 12/11/2022   Procedure: TRANSESOPHAGEAL ECHOCARDIOGRAM (TEE);  Surgeon: Rolan Ezra RAMAN, MD;  Location: [facility] ENDOSCOPY;  Service: Cardiovascular;  Laterality: N/A;   TEMPORARY PACEMAKER N/A 03/22/2019   Procedure: TEMPORARY PACEMAKER;  Surgeon: Claudene Victory ORN, MD;  Location: [facility] INVASIVE CV LAB;  Service:  Cardiovascular;  Laterality: N/A;   TOTAL KNEE ARTHROPLASTY Right 01/19/2014   Procedure: RIGHT TOTAL KNEE ARTHROPLASTY, Steroid injection left knee;  Surgeon: Lonni GRADE Vernetta, MD;  Location[facility];  Service: Orthopedics;  Laterality: Right;   TOTAL KNEE ARTHROPLASTY Left 10/23/2014   Procedure: LEFT TOTAL KNEE ARTHROPLASTY;  Surgeon: Lonni GRADE Vernetta, MD;  Location[facility];  Service: Orthopedics;  Laterality: Left;   UMBILICAL HERNIA REPAIR N/A 07/10/2016   Procedure: UMBILICAL HERNIA REPAIR;  Surgeon: Vicenta Vernetta, MD;  Location: [facility];  Service: General;  Laterality: N/A;  MEDICATIONS:  ALPRAZolam  (XANAX ) 0.5 MG tablet   atorvastatin  (LIPITOR) 20 MG tablet   bismuth  subsalicylate (PEPTO BISMOL) 262 MG/15ML suspension   Docusate Sodium  (DSS) 100 MG CAPS   meclizine  (ANTIVERT ) 25 MG tablet   Melatonin 10 MG TABS   metoprolol  tartrate (LOPRESSOR ) 25 MG tablet   Multiple Vitamin (MULTIVITAMIN WITH MINERALS) TABS tablet   oxyCODONE -acetaminophen  (PERCOCET) 10-325 MG tablet   sacubitril -valsartan  (ENTRESTO ) 49-51 MG   silver  sulfADIAZINE  (SILVADENE ) 1 % cream   spironolactone  (ALDACTONE ) 25 MG tablet   torsemide  (DEMADEX ) 20 MG tablet   warfarin (COUMADIN ) 5 MG tablet   No current facility-administered medications for this encounter.     Harlene Hoots Ward, PA-C [facility] [phone]

## 2023-11-09 ENCOUNTER — Telehealth: Payer: Self-pay | Admitting: *Deleted

## 2023-11-09 NOTE — Telephone Encounter (Signed)
 Patient with diagnosis of afib on warfarin for anticoagulation.    Procedure: RIGHT TOTAL HIP REVISION  Date of procedure: 11/12/23   CHA2DS2-VASc Score = 3   This indicates a 3.2% annual risk of stroke. The patient's score is based upon: CHF History: 1 HTN History: 1 Diabetes History: 0 Stroke History: 0 Vascular Disease History: 0 Age Score: 1 Gender Score: 0      Per office protocol, patient can hold warfarin for 5 days prior to procedure.    Patient will NOT need bridging with Lovenox  (enoxaparin ) around procedure.  **This guidance is not considered finalized until pre-operative APP has relayed final recommendations.**

## 2023-11-09 NOTE — Telephone Encounter (Signed)
   Pre-operative Risk Assessment    Patient Name: [name]  DOB: [birth year] MRN: [id number]   Date of last office visit: 10/18/23 Date of next office visit: 01/07/24   Request for Surgical Clearance    Procedure:   RIGHT TOTAL HIP REVISION  Date of Surgery:  Clearance 11/12/2023                               Surgeon:  LONNI POLI, MD Surgeon's Group or Practice Name:  [facility] Phone number:  [phone] Fax number:  [phone]   Type of Clearance Requested:   - Medical  - Pharmacy:  Hold Warfarin (Coumadin ) X'S 5 DAYS  (PT ALREADY AWARE TO HOLD MED) ANESTHESIA IS ACTUALLY ASKING NOT DR.  THAT'S WHY WE GOT IT LATE   Type of Anesthesia:   [facility] VS GENERAL   Additional requests/questions:    [facility]   11/09/2023, 9:56 AM

## 2023-11-10 ENCOUNTER — Other Ambulatory Visit: Payer: Self-pay

## 2023-11-10 ENCOUNTER — Telehealth: Payer: Self-pay

## 2023-11-10 ENCOUNTER — Ambulatory Visit: Payer: Medicare Other | Attending: Student | Admitting: Student

## 2023-11-10 DIAGNOSIS — Z01810 Encounter for preprocedural cardiovascular examination: Secondary | ICD-10-CM

## 2023-11-10 NOTE — Telephone Encounter (Signed)
   Name: [name]  DOB: January 19, 1956  MRN: [id number]  Primary Cardiologist: Dorn Lesches, MD   Preoperative team, please contact this patient and set up a phone call appointment for further preoperative risk assessment. Please obtain consent and complete medication review. Thank you for your help.  I confirm that guidance regarding antiplatelet and oral anticoagulation therapy has been completed and, if necessary, noted below.  Per Pharm D, patient may hold warfarin for 5 days prior to procedure. Patient will NOT need bridging with Lovenox  around procedure.    I also confirmed the patient resides in the state of [place] . As per [facility] Medical Board telemedicine laws, the patient must reside in the state in which the provider is licensed.   Barnie Hila, NP 11/10/2023, 8:42 AM [facility]

## 2023-11-10 NOTE — Telephone Encounter (Signed)
 Preop televisit now scheduled, med rec and consent done.

## 2023-11-10 NOTE — Progress Notes (Signed)
 Virtual Visit via Telephone Note   Because of [name]'s co-morbid illnesses, he is at least at moderate risk for complications without adequate follow up.  This format is felt to be most appropriate for this patient at this time.  The patient did not have access to video technology/had technical difficulties with video requiring transitioning to audio format only (telephone).  All issues noted in this document were discussed and addressed.  No physical exam could be performed with this format.  Please refer to the patient's chart for his consent to telehealth for [facility].  Evaluation Performed:  Preoperative cardiovascular risk assessment _____________   Date:  11/10/2023   Patient ID:  [name], DOB July 28, 1956, MRN [id number] Patient Location:  Home Provider location:   Office  Primary Care Provider:  Pura Lenis, MD Primary Cardiologist:  Douglas Lesches, MD  Chief Complaint / Patient Profile   68 y.o. y/o male with a h/o chronic systolic heart failure, PAF s/p maze and LAA clipping as well as s/p A-fib ablation x 04 June 2019 and September 2024 on anticoagulation, mitral valve prolapse s/p minimally invasive MVR May 2020, hypertension, hyperlipidemia who is pending revision of right total hip arthroplasty by Dr. Vernetta and presents today for telephonic preoperative cardiovascular risk assessment.  History of Present Illness    [name] is a 68 y.o. male who presents via audio/video conferencing for a telehealth visit today.  Pt was last seen in cardiology clinic on 10/18/2023 by Dr. Nancey.  At that time [name] was stable from a cardiac standpoint.  The patient is now pending procedure as outlined above. Since his last visit, he is doing well. Patient denies shortness of breath, dyspnea on exertion,orthopnea or PND. He has chronic left lower extremity edema that he manages with compression. Since his issue with dislocating his hip he has had  some right lower extremity edema. No chest pain, pressure, or tightness. No palpitations.  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   Past Medical History    Past Medical History:  Diagnosis Date   Acute on chronic systolic (congestive) heart failure (HCC) 12/30/2018   Allergy    Anxiety    Arthritis    Atrial flutter with rapid ventricular response (HCC)    Chronic systolic (congestive) heart failure (HCC)    COVID 10/2021   mild   Dilated aortic root (HCC) 01/04/2019   Dilated cardiomyopathy (HCC) 01/04/2019   Dysrhythmia    Heart murmur    History of blood transfusion    01/27/22   History of colon polyps    Hypertension    Insomnia    Mitral valve prolapse    Mitral valve regurgitation    Persistent atrial fibrillation (HCC) 12/02/2018   S/P Maze operation for atrial fibrillation 03/21/2019   Complete bilateral atrial lesion set using cryothermy and bipolar radiofrequency ablation with clipping of LA appendage via right mini thoracotomy approach   S/P minimally invasive mitral valve repair 03/21/2019   Complex valvuloplasty including triangular resection of posterior leaflet, artificial Gore-tex neochord placement x4 and 36 mm Sorin Memo 4D ring annuloplasty via right mini thoracotomy approach   Seizures (HCC)    had one approx. 30 yrs ago,has not had any since; due to alchol and no sleep   Severe mitral regurgitation    Past Surgical History:  Procedure Laterality Date   ANKLE FUSION  left   Dec. 2012  APPLICATION OF WOUND VAC Left 09/22/2021   Procedure: APPLICATION OF WOUND VAC;  Surgeon: [name] Lonni GRADE, MD;  Location: [facility];  Service: Orthopedics;  Laterality: Left;   APPLICATION OF WOUND VAC Left 12/30/2021   Procedure: APPLICATION OF WOUND VAC;  Surgeon: [name] Lonni GRADE, MD;  Location: [facility] OR;  Service: Orthopedics;  Laterality: Left;   APPLICATION OF WOUND VAC Left 04/10/2022   Procedure: APPLICATION OF WOUND VAC;   Surgeon: Harden Jerona GAILS, MD;  Location: [facility] OR;  Service: Orthopedics;  Laterality: Left;   ARTHROSCOPY KNEE W/ DRILLING  bilateral   2012   ATRIAL FIBRILLATION ABLATION N/A 07/15/2023   Procedure: ATRIAL FIBRILLATION ABLATION;  Surgeon: Nancey Eulas BRAVO, MD;  Location: [facility] INVASIVE CV LAB;  Service: Cardiovascular;  Laterality: N/A;   BIOPSY  01/30/2022   Procedure: BIOPSY;  Surgeon: Wilhelmenia Aloha Raddle., MD;  Location: [facility];  Service: Gastroenterology;;   CARDIAC VALVE REPLACEMENT     CARDIOVERSION N/A 01/03/2019   Procedure: CARDIOVERSION;  Surgeon: Shlomo Wilbert SAUNDERS, MD;  Location: [facility] ENDOSCOPY;  Service: Cardiovascular;  Laterality: N/A;   CARDIOVERSION N/A 06/23/2019   Procedure: CARDIOVERSION;  Surgeon: Okey Vina GAILS, MD;  Location: [facility] ENDOSCOPY;  Service: Cardiovascular;  Laterality: N/A;   CARDIOVERSION N/A 12/11/2022   Procedure: CARDIOVERSION;  Surgeon: Rolan Ezra RAMAN, MD;  Location: [facility] ENDOSCOPY;  Service: Cardiovascular;  Laterality: N/A;   CLIPPING OF ATRIAL APPENDAGE  03/21/2019   Procedure: Clipping Of Atrial Appendage using 45mm Atricure Pro2 Clip;  Surgeon: Dusty Sudie DEL, MD;  Location: [facility] OR;  Service: Open Heart Surgery;;   COLONOSCOPY     x2   COLONOSCOPY N/A 01/30/2022   Procedure: COLONOSCOPY;  Surgeon: Wilhelmenia Aloha Raddle., MD;  Location: [facility] ENDOSCOPY;  Service: Gastroenterology;  Laterality: N/A;   ESOPHAGOGASTRODUODENOSCOPY N/A 01/30/2022   Procedure: ESOPHAGOGASTRODUODENOSCOPY (EGD);  Surgeon: Wilhelmenia Aloha Raddle., MD;  Location: [facility];  Service: Gastroenterology;  Laterality: N/A;   FOOT ARTHRODESIS  06/23/2012   Procedure: ARTHRODESIS FOOT;  Surgeon: Norleen Armor, MD;  Location: [facility] OR;  Service: Orthopedics;  Laterality: Left;  Left Subtalar and Talonavicular Joint Revision Arthrodesis  Aspiration of Bone Marrow from Left Hip    HAIR TRANSPLANT     HARDWARE REMOVAL  06/23/2012   Procedure: HARDWARE REMOVAL;  Surgeon: Norleen Armor, MD;  Location: [facility]  OR;  Service: Orthopedics;  Laterality: Left;  Removal of Deep Implant  X's 3   I & D EXTREMITY Left 09/23/2021   Procedure: IRRIGATION AND DEBRIDEMENT OF LEG;  Surgeon: [name] Lonni GRADE, MD;  Location: [facility] OR;  Service: Orthopedics;  Laterality: Left;   I & D EXTREMITY Left 09/26/2021   Procedure: REPEAT IRRIGATION AND DEBRIDEMENT LEFT LEG, POSSIBLE WOUND CLOSURE, POSSIBLE VAC CHANGE, POSSIBLE SKIN GRAFT;  Surgeon: [name] Lonni GRADE, MD;  Location: [facility];  Service: Orthopedics;  Laterality: Left;   I & D EXTREMITY Left 12/30/2021   Procedure: IRRIGATION AND DEBRIDEMENT LEFT LOWER LEG WOUND;  Surgeon: [name] Lonni GRADE, MD;  Location: [facility] OR;  Service: Orthopedics;  Laterality: Left;   I & D EXTREMITY Left 01/02/2022   Procedure: LEFT LEG DEBRIDEMENT AND TISSUE GRAFT;  Surgeon: Harden Jerona GAILS, MD;  Location: [facility] OR;  Service: Orthopedics;  Laterality: Left;   INCISION AND DRAINAGE WOUND WITH FASCIOTOMY Left 09/22/2021   Procedure: INCISION AND DRAINAGE WOUND WITH FASCIOTOMY;  Surgeon: [name] Lonni GRADE, MD;  Location: [facility] OR;  Service: Orthopedics;  Laterality: Left;   INSERTION OF MESH N/A 07/10/2016  Procedure: INSERTION OF MESH;  Surgeon: Vicenta Poli, MD;  Location: [facility];  Service: General;  Laterality: N/A;   JOINT REPLACEMENT     right hip  01-2011   LEFT HEART CATH AND CORONARY ANGIOGRAPHY N/A 03/16/2019   Procedure: LEFT HEART CATH AND CORONARY ANGIOGRAPHY;  Surgeon: Wonda Sharper, MD;  Location: [facility] INVASIVE CV LAB;  Service: Cardiovascular;  Laterality: N/A;   LIMB SPARING RESECTION HIP W/ SADDLE JOINT REPLACEMENT Right    MINIMALLY INVASIVE MAZE PROCEDURE N/A 03/21/2019   Procedure: MINIMALLY INVASIVE MAZE PROCEDURE;  Surgeon: Dusty Sudie DEL, MD;  Location: [facility] OR;  Service: Open Heart Surgery;  Laterality: N/A;   MITRAL VALVE REPAIR Right 03/21/2019   Procedure: MINIMALLY INVASIVE MITRAL VALVE REPAIR (MVR) using Memo 4D ring size 36;  Surgeon:  Dusty Sudie DEL, MD;  Location: [facility] OR;  Service: Open Heart Surgery;  Laterality: Right;   POLYPECTOMY  01/30/2022   Procedure: POLYPECTOMY;  Surgeon: Mansouraty, Aloha Raddle., MD;  Location: [facility];  Service: Gastroenterology;;   SKIN SPLIT GRAFT Left 04/10/2022   Procedure: APPLY SKIN GRAFT TO LEFT LEG WOUND;  Surgeon: Harden Jerona GAILS, MD;  Location: [facility] OR;  Service: Orthopedics;  Laterality: Left;   TEE WITHOUT CARDIOVERSION N/A 01/03/2019   Procedure: TRANSESOPHAGEAL ECHOCARDIOGRAM (TEE);  Surgeon: Shlomo Wilbert SAUNDERS, MD;  Location: [facility] ENDOSCOPY;  Service: Cardiovascular;  Laterality: N/A;   TEE WITHOUT CARDIOVERSION N/A 03/21/2019   Procedure: TRANSESOPHAGEAL ECHOCARDIOGRAM (TEE);  Surgeon: Dusty Sudie DEL, MD;  Location: [facility] OR;  Service: Open Heart Surgery;  Laterality: N/A;   TEE WITHOUT CARDIOVERSION N/A 12/11/2022   Procedure: TRANSESOPHAGEAL ECHOCARDIOGRAM (TEE);  Surgeon: Rolan Ezra RAMAN, MD;  Location: [facility] ENDOSCOPY;  Service: Cardiovascular;  Laterality: N/A;   TEMPORARY PACEMAKER N/A 03/22/2019   Procedure: TEMPORARY PACEMAKER;  Surgeon: Claudene Victory ORN, MD;  Location: [facility] INVASIVE CV LAB;  Service: Cardiovascular;  Laterality: N/A;   TOTAL KNEE ARTHROPLASTY Right 01/19/2014   Procedure: RIGHT TOTAL KNEE ARTHROPLASTY, Steroid injection left knee;  Surgeon: [name], MD;  Location: [facility] ORS;  Service: Orthopedics;  Laterality: Right;   TOTAL KNEE ARTHROPLASTY Left 10/23/2014   Procedure: LEFT TOTAL KNEE ARTHROPLASTY;  Surgeon: [name], MD;  Location: [facility] ORS;  Service: Orthopedics;  Laterality: Left;   UMBILICAL HERNIA REPAIR N/A 07/10/2016   Procedure: UMBILICAL HERNIA REPAIR;  Surgeon: Vicenta Poli, MD;  Location: [facility];  Service: General;  Laterality: N/A;    Allergies  No Known Allergies  Home Medications    Prior to Admission medications   Medication Sig Start Date End Date Taking? Authorizing Provider  ALPRAZolam  (XANAX ) 0.5 MG  tablet Take 0.5 mg by mouth 2 (two) times daily. 12/16/21   [provider]  atorvastatin  (LIPITOR) 20 MG tablet Take 20 mg by mouth every morning.    [provider]  bismuth  subsalicylate (PEPTO BISMOL) 262 MG/15ML suspension Take 30 mLs by mouth every 6 (six) hours as needed for indigestion or diarrhea or loose stools.    [provider]  Docusate Sodium  (DSS) 100 MG CAPS Take 300 mg by mouth every morning.    [provider]  meclizine  (ANTIVERT ) 25 MG tablet Take 50 mg by mouth daily as needed for dizziness.    [provider]  Melatonin 10 MG TABS Take 10 mg by mouth at bedtime.    [provider]  metoprolol  tartrate (LOPRESSOR ) 25 MG tablet Take 25 mg by mouth daily. 06/22/23  [provider]  Multiple Vitamin (MULTIVITAMIN WITH MINERALS) TABS tablet Take 1 tablet by mouth every morning.    [provider]  oxyCODONE -acetaminophen  (PERCOCET) 10-325 MG tablet Take 1-1.5 tablets by mouth in the morning, at noon, in the evening, and at bedtime. 06/16/23   [provider]  sacubitril -valsartan  (ENTRESTO ) 49-51 MG Take 1 tablet by mouth 2 (two) times daily. 02/02/23   Rolan Ezra RAMAN, MD  silver  sulfADIAZINE  (SILVADENE ) 1 % cream Apply 1 Application topically daily. Apply to affected area daily plus dry dressing Patient taking differently: Apply 1 Application topically every 3 (three) days. 07/07/22   Harden Jerona GAILS, MD  spironolactone  (ALDACTONE ) 25 MG tablet Take 1 tablet (25 mg total) by mouth daily. 07/29/23   Rolan Ezra RAMAN, MD  torsemide  (DEMADEX ) 20 MG tablet TAKE 1 TABLET EVERY DAY 06/22/23   Berry, Jonathan J, MD  warfarin (COUMADIN ) 5 MG tablet TAKE 1 TABLET EVERY DAY EXCEPT TAKE 1 AND 1/2 TABLETS ON WEDNESDAY AS INSTRUCTED Patient taking differently: Take 5-7.5 mg by mouth See admin instructions. Take 5 mg by mouth daily, except Wednesday and Friday take 7.5 mg 10/25/23   Ewing Douglas PARAS, MD    Physical Exam     Vital Signs:  [name] does not have vital signs available for review today.  Given telephonic nature of communication, physical exam is limited. AAOx3. NAD. Normal affect.  Speech and respirations are unlabored.   Assessment & Plan    Primary Cardiologist: Douglas Court, MD  Preoperative cardiovascular risk assessment.  Right total hip revision by Dr. Vernetta on 11/12/2023.  Chart reviewed as part of pre-operative protocol coverage. According to the RCRI, patient has a 0.9% risk of MACE. 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   Given past medical history and time since last visit, based on ACC/AHA guidelines, [name] would be at acceptable risk for the planned procedure without further cardiovascular testing.   Patient was advised that if he develops new symptoms prior to surgery to contact our office to arrange a follow-up appointment.  he verbalized understanding.  Per Pharm D, patient may hold warfarin for 5 days prior to procedure.  Patient will not need bridging with Lovenox  (enoxaparin ) around procedure.  I will route this recommendation to the requesting party via Epic fax function.  Please call with questions.  Time:   Today, I have spent 6 minutes with the patient with telehealth technology discussing medical history, symptoms, and management plan.     [name] Hila, NP  11/10/2023, 2:33 PM

## 2023-11-10 NOTE — Telephone Encounter (Signed)
  Patient Consent for Virtual Visit        [name] has provided verbal consent on 11/10/2023 for a virtual visit (video or telephone).   CONSENT FOR VIRTUAL VISIT FOR:  [name]  By participating in this virtual visit I agree to the following:  I hereby voluntarily request, consent and authorize [facility] and its employed or contracted physicians, physician assistants, nurse practitioners or other licensed health care professionals (the Practitioner), to provide me with telemedicine health care services (the "Services) as deemed necessary by the treating Practitioner. I acknowledge and consent to receive the Services by the Practitioner via telemedicine. I understand that the telemedicine visit will involve communicating with the Practitioner through live audiovisual communication technology and the disclosure of certain medical information by electronic transmission. I acknowledge that I have been given the opportunity to request an in-person assessment or other available alternative prior to the telemedicine visit and am voluntarily participating in the telemedicine visit.  I understand that I have the right to withhold or withdraw my consent to the use of telemedicine in the course of my care at any time, without affecting my right to future care or treatment, and that the Practitioner or I may terminate the telemedicine visit at any time. I understand that I have the right to inspect all information obtained and/or recorded in the course of the telemedicine visit and may receive copies of available information for a reasonable fee.  I understand that some of the potential risks of receiving the Services via telemedicine include:  Delay or interruption in medical evaluation due to technological equipment failure or disruption; Information transmitted may not be sufficient (e.g. poor resolution of images) to allow for appropriate medical decision making by the Practitioner;  and/or  In rare instances, security protocols could fail, causing a breach of personal health information.  Furthermore, I acknowledge that it is my responsibility to provide information about my medical history, conditions and care that is complete and accurate to the best of my ability. I acknowledge that Practitioner's advice, recommendations, and/or decision may be based on factors not within their control, such as incomplete or inaccurate data provided by me or distortions of diagnostic images or specimens that may result from electronic transmissions. I understand that the practice of medicine is not an exact science and that Practitioner makes no warranties or guarantees regarding treatment outcomes. I acknowledge that a copy of this consent can be made available to me via my patient portal [facility] MyChart), or I can request a printed copy by calling the office of [facility].    I understand that my insurance will be billed for this visit.   I have read or had this consent read to me. I understand the contents of this consent, which adequately explains the benefits and risks of the Services being provided via telemedicine.  I have been provided ample opportunity to ask questions regarding this consent and the Services and have had my questions answered to my satisfaction. I give my informed consent for the services to be provided through the use of telemedicine in my medical care

## 2023-11-11 ENCOUNTER — Encounter: Payer: Self-pay | Admitting: Orthopedic Surgery

## 2023-11-11 DIAGNOSIS — T84020A Dislocation of internal right hip prosthesis, initial encounter: Secondary | ICD-10-CM

## 2023-11-11 NOTE — Anesthesia Preprocedure Evaluation (Addendum)
 Anesthesia Evaluation  Patient identified by MRN, date of birth, ID band Patient awake    Reviewed: Allergy & Precautions, NPO status , Patient's Chart, lab work & pertinent test results, reviewed documented beta blocker date and time   Airway Mallampati: III  TM Distance: >3 FB Neck ROM: Full    Dental  (+) Dental Advisory Given, Poor Dentition, Chipped   Pulmonary neg pulmonary ROS   Pulmonary exam normal breath sounds clear to auscultation       Cardiovascular hypertension, Pt. on home beta blockers and Pt. on medications (-) angina +CHF  Normal cardiovascular exam+ dysrhythmias (s/p MAZE) Atrial Fibrillation + Valvular Problems/Murmurs (s/p MVR) MR and MVP  Rhythm:Regular Rate:Normal  Echo 12/11/22:  1. Left ventricular ejection fraction, by estimation, is 40 to 45%. The  left ventricle has mildly decreased function. The left ventricle  demonstrates global hypokinesis. There is mild concentric left ventricular  hypertrophy.   2. Right ventricular systolic function is mildly reduced. The right  ventricular size is normal.   3. The atrial appendage has been surgically clipped, no thrombus in  residual appendage or in LA. Left atrial size was mildly dilated.   4. The mitral valve has been repaired. Mild mitral valve regurgitation.  No evidence of mitral stenosis. The mean mitral valve gradient is 2.0  mmHg. There is a prosthetic annuloplasty ring present in the mitral  position. Procedure Date: 03/21/19.   5. The aortic valve is tricuspid. Aortic valve regurgitation is not  visualized. No aortic stenosis is present.   6. No ASD or PFO by color doppler.      Neuro/Psych Seizures -,  PSYCHIATRIC DISORDERS Anxiety      Neuromuscular disease    GI/Hepatic negative GI ROS, Neg liver ROS,,,  Endo/Other  Obesity   Renal/GU negative Renal ROS     Musculoskeletal  (+) Arthritis ,  Recurrent Dislocation Right Total Hip    Abdominal   Peds  Hematology  (+) Blood dyscrasia (Warfarin)   Anesthesia Other Findings Day of surgery medications reviewed with the patient.  Reproductive/Obstetrics                             Anesthesia Physical Anesthesia Plan  ASA: 3  Anesthesia Plan: Spinal   Post-op Pain Management: Tylenol  PO (pre-op)*   Induction: Intravenous  PONV Risk Score and Plan: 2 and TIVA, Midazolam , Dexamethasone  and Ondansetron   Airway Management Planned: Natural Airway and Simple Face Mask  Additional Equipment:   Intra-op Plan:   Post-operative Plan:   Informed Consent: I have reviewed the patients History and Physical, chart, labs and discussed the procedure including the risks, benefits and alternatives for the proposed anesthesia with the patient or authorized representative who has indicated his/her understanding and acceptance.     Dental advisory given  Plan Discussed with: CRNA  Anesthesia Plan Comments: (See PAT note 11/05/2023)       Anesthesia Quick Evaluation

## 2023-11-11 NOTE — Progress Notes (Signed)
 Called and updated of surgery time change.   Aware to be at Short Stay at 1000 for 1230 surgery.

## 2023-11-11 NOTE — H&P (Signed)
 [name] is an 68 y.o. male.   Chief Complaint: Right hip instability HPI:   The patient is a 68 year old gentleman who underwent a right direct anterior total hip arthroplasty back in 2012 secondary to significant right hip arthritis.  That has been close to 13 years ago.  He has never had a problem with that hip replacement until a very hard mechanical fall last month when she landed hard on his right hip and sustained a dislocation.  This was reduced in the emergency room and then 2 weeks later he dislocated the hip again.  At this point I recommended revision of the right hip given the recurrent instability.  He understands that our recommendation is opened up the hip to get an idea of what is going on with the soft tissues and to assess the components in order to determine what revision is needed to stabilize the hip and hopefully keep it from dislocating again.  We talked about the possibility of lengthening him in a constrained liner depending on our intraoperative findings.  Past Medical History:  Diagnosis Date   Acute on chronic systolic (congestive) heart failure (HCC) 12/30/2018   Allergy    Anxiety    Arthritis    Atrial flutter with rapid ventricular response (HCC)    Chronic systolic (congestive) heart failure (HCC)    COVID 10/2021   mild   Dilated aortic root (HCC) 01/04/2019   Dilated cardiomyopathy (HCC) 01/04/2019   Dysrhythmia    Heart murmur    History of blood transfusion    01/27/22   History of colon polyps    Hypertension    Insomnia    Mitral valve prolapse    Mitral valve regurgitation    Persistent atrial fibrillation (HCC) 12/02/2018   S/P Maze operation for atrial fibrillation 03/21/2019   Complete bilateral atrial lesion set using cryothermy and bipolar radiofrequency ablation with clipping of LA appendage via right mini thoracotomy approach   S/P minimally invasive mitral valve repair 03/21/2019   Complex valvuloplasty including triangular resection  of posterior leaflet, artificial Gore-tex neochord placement x4 and 36 mm Sorin Memo 4D ring annuloplasty via right mini thoracotomy approach   Seizures (HCC)    had one approx. 30 yrs ago,has not had any since; due to alchol and no sleep   Severe mitral regurgitation     Past Surgical History:  Procedure Laterality Date   ANKLE FUSION  left   Dec. 2012   APPLICATION OF WOUND VAC Left 09/22/2021   Procedure: APPLICATION OF WOUND VAC;  Surgeon: Vernetta Lonni GRADE, MD;  Location: [facility];  Service: Orthopedics;  Laterality: Left;   APPLICATION OF WOUND VAC Left 12/30/2021   Procedure: APPLICATION OF WOUND VAC;  Surgeon: Vernetta Lonni GRADE, MD;  Location: [facility];  Service: Orthopedics;  Laterality: Left;   APPLICATION OF WOUND VAC Left 04/10/2022   Procedure: APPLICATION OF WOUND VAC;  Surgeon: Harden Jerona GAILS, MD;  Location: [facility] OR;  Service: Orthopedics;  Laterality: Left;   ARTHROSCOPY KNEE W/ DRILLING  bilateral   2012   ATRIAL FIBRILLATION ABLATION N/A 07/15/2023   Procedure: ATRIAL FIBRILLATION ABLATION;  Surgeon: Nancey Eulas BRAVO, MD;  Location: [facility] INVASIVE CV LAB;  Service: Cardiovascular;  Laterality: N/A;   BIOPSY  01/30/2022   Procedure: BIOPSY;  Surgeon: Wilhelmenia Aloha Raddle., MD;  Location: [facility];  Service: Gastroenterology;;   CARDIAC VALVE REPLACEMENT     CARDIOVERSION N/A 01/03/2019   Procedure: CARDIOVERSION;  Surgeon: Shlomo Wilbert SAUNDERS,  MD;  [facility];  Service: Cardiovascular;  Laterality: N/A;   CARDIOVERSION N/A 06/23/2019   Procedure: CARDIOVERSION;  Surgeon: Okey Vina GAILS, MD;  Location: [facility] ENDOSCOPY;  Service: Cardiovascular;  Laterality: N/A;   CARDIOVERSION N/A 12/11/2022   Procedure: CARDIOVERSION;  Surgeon: Rolan Ezra RAMAN, MD;  Location: [facility] ENDOSCOPY;  Service: Cardiovascular;  Laterality: N/A;   CLIPPING OF ATRIAL APPENDAGE  03/21/2019   Procedure: Clipping Of Atrial Appendage using 45mm Atricure Pro2 Clip;  Surgeon: Dusty Sudie DEL, MD;   Location: [facility] OR;  Service: Open Heart Surgery;;   COLONOSCOPY     x2   COLONOSCOPY N/A 01/30/2022   Procedure: COLONOSCOPY;  Surgeon: Wilhelmenia Aloha Raddle., MD;  Location: [facility];  Service: Gastroenterology;  Laterality: N/A;   ESOPHAGOGASTRODUODENOSCOPY N/A 01/30/2022   Procedure: ESOPHAGOGASTRODUODENOSCOPY (EGD);  Surgeon: Wilhelmenia Aloha Raddle., MD;  Location: [facility];  Service: Gastroenterology;  Laterality: N/A;   FOOT ARTHRODESIS  06/23/2012   Procedure: ARTHRODESIS FOOT;  Surgeon: Norleen Armor, MD;  [facility] OR;  Service: Orthopedics;  Laterality: Left;  Left Subtalar and Talonavicular Joint Revision Arthrodesis  Aspiration of Bone Marrow from Left Hip    HAIR TRANSPLANT     HARDWARE REMOVAL  06/23/2012   Procedure: HARDWARE REMOVAL;  Surgeon: Norleen Armor, MD;  Location: [facility] OR;  Service: Orthopedics;  Laterality: Left;  Removal of Deep Implant  X's 3   I & D EXTREMITY Left 09/23/2021   Procedure: IRRIGATION AND DEBRIDEMENT OF LEG;  Surgeon: Vernetta Lonni GRADE, MD;  [facility];  Service: Orthopedics;  Laterality: Left;   I & D EXTREMITY Left 09/26/2021   Procedure: REPEAT IRRIGATION AND DEBRIDEMENT LEFT LEG, POSSIBLE WOUND CLOSURE, POSSIBLE VAC CHANGE, POSSIBLE SKIN GRAFT;  Surgeon: Vernetta Lonni GRADE, MD;  [facility]: [facility];  Service: Orthopedics;  Laterality: Left;   I & D EXTREMITY Left 12/30/2021   Procedure: IRRIGATION AND DEBRIDEMENT LEFT LOWER LEG WOUND;  Surgeon: Vernetta Lonni GRADE, MD;  [facility]: [facility];  Service: Orthopedics;  Laterality: Left;   I & D EXTREMITY Left 01/02/2022   Procedure: LEFT LEG DEBRIDEMENT AND TISSUE GRAFT;  Surgeon: Harden Jerona GAILS, MD;  Location: [facility] OR;  Service: Orthopedics;  Laterality: Left;   INCISION AND DRAINAGE WOUND WITH FASCIOTOMY Left 09/22/2021   Procedure: INCISION AND DRAINAGE WOUND WITH FASCIOTOMY;  Surgeon: Vernetta Lonni GRADE, MD;  Location: [facility];  Service: Orthopedics;  Laterality: Left;   INSERTION OF MESH  N/A 07/10/2016   Procedure: INSERTION OF MESH;  Surgeon: Vicenta Vernetta, MD;  Location: [facility];  Service: General;  Laterality: N/A;   JOINT REPLACEMENT     right hip  01-2011   LEFT HEART CATH AND CORONARY ANGIOGRAPHY N/A 03/16/2019   Procedure: LEFT HEART CATH AND CORONARY ANGIOGRAPHY;  Surgeon: Wonda Sharper, MD;  Location: [facility] INVASIVE CV LAB;  Service: Cardiovascular;  Laterality: N/A;   LIMB SPARING RESECTION HIP W/ SADDLE JOINT REPLACEMENT Right    MINIMALLY INVASIVE MAZE PROCEDURE N/A 03/21/2019   Procedure: MINIMALLY INVASIVE MAZE PROCEDURE;  Surgeon: Dusty Sudie DEL, MD;  Location: [facility] OR;  Service: Open Heart Surgery;  Laterality: N/A;   MITRAL VALVE REPAIR Right 03/21/2019   Procedure: MINIMALLY INVASIVE MITRAL VALVE REPAIR (MVR) using Memo 4D ring size 36;  Surgeon: Dusty Sudie DEL, MD;  Location: [facility] OR;  Service: Open Heart Surgery;  Laterality: Right;   POLYPECTOMY  01/30/2022   Procedure: POLYPECTOMY;  Surgeon: Mansouraty, Aloha Raddle., MD;  Location: [facility];  Service:  Gastroenterology;;   SKIN SPLIT GRAFT Left 04/10/2022   Procedure: APPLY SKIN GRAFT TO LEFT LEG WOUND;  Surgeon: Harden Jerona GAILS, MD;  Location: [facility] OR;  Service: Orthopedics;  Laterality: Left;   TEE WITHOUT CARDIOVERSION N/A 01/03/2019   Procedure: TRANSESOPHAGEAL ECHOCARDIOGRAM (TEE);  Surgeon: Shlomo Wilbert SAUNDERS, MD;  Location: [facility] ENDOSCOPY;  Service: Cardiovascular;  Laterality: N/A;   TEE WITHOUT CARDIOVERSION N/A 03/21/2019   Procedure: TRANSESOPHAGEAL ECHOCARDIOGRAM (TEE);  Surgeon: Dusty Sudie DEL, MD;  Location: [facility] OR;  Service: Open Heart Surgery;  Laterality: N/A;   TEE WITHOUT CARDIOVERSION N/A 12/11/2022   Procedure: TRANSESOPHAGEAL ECHOCARDIOGRAM (TEE);  Surgeon: Rolan Ezra RAMAN, MD;  Location: [facility] ENDOSCOPY;  Service: Cardiovascular;  Laterality: N/A;   TEMPORARY PACEMAKER N/A 03/22/2019   Procedure: TEMPORARY PACEMAKER;  Surgeon: Claudene Victory ORN, MD;  Location: [facility] INVASIVE CV LAB;   Service: Cardiovascular;  Laterality: N/A;   TOTAL KNEE ARTHROPLASTY Right 01/19/2014   Procedure: RIGHT TOTAL KNEE ARTHROPLASTY, Steroid injection left knee;  Surgeon: [name], MD;  Location: [facility] ORS;  Service: Orthopedics;  Laterality: Right;   TOTAL KNEE ARTHROPLASTY Left 10/23/2014   Procedure: LEFT TOTAL KNEE ARTHROPLASTY;  Surgeon: [name], MD;  Location: [facility] ORS;  Service: Orthopedics;  Laterality: Left;   UMBILICAL HERNIA REPAIR N/A 07/10/2016   Procedure: UMBILICAL HERNIA REPAIR;  Surgeon: Vicenta Poli, MD;  Location: [facility];  Service: General;  Laterality: N/A;    Family History  Problem Relation Age of Onset   Hypertension Father    Colon cancer Father        dx in his late 54's   Diabetes Mother    Stomach cancer Neg Hx    Esophageal cancer Neg Hx    Pancreatic cancer Neg Hx    Rectal cancer Neg Hx    Social History:  reports that he has never smoked. He has never used smokeless tobacco. He reports that he does not currently use alcohol. He reports that he does not use drugs.  Allergies: No Known Allergies  No medications prior to admission.    No results found for this or any previous visit (from the past 48 hours). No results found.  Review of Systems  There were no vitals taken for this visit. Physical Exam Vitals reviewed.  Constitutional:      Appearance: Normal appearance. He is normal weight.  HENT:     Head: Normocephalic and atraumatic.  Eyes:     Extraocular Movements: Extraocular movements intact.     Pupils: Pupils are equal, round, and reactive to light.  Cardiovascular:     Rate and Rhythm: Normal rate.  Pulmonary:     Effort: Pulmonary effort is normal.     Breath sounds: Normal breath sounds.  Abdominal:     Palpations: Abdomen is soft.  Musculoskeletal:     Cervical back: Normal range of motion and neck supple.     Right hip: Tenderness and bony tenderness present. Decreased strength.   Neurological:     Mental Status: He is alert and oriented to person, place, and time.  Psychiatric:        Behavior: Behavior normal.      Assessment/Plan Instability of right total hip arthroplasty  Again the plan is to proceed to surgery for an assessment of the patient's right hip and full revising the components as warranted and necessary to allow for more stable hip.  The risks and benefits of the surgery and the rationale behind surgery  as well as his recommendations have been described in detail.  [name], MD 11/11/2023, 12:42 PM

## 2023-11-12 ENCOUNTER — Inpatient Hospital Stay (HOSPITAL_COMMUNITY): Payer: Medicare Other | Admitting: Anesthesiology

## 2023-11-12 ENCOUNTER — Inpatient Hospital Stay (HOSPITAL_COMMUNITY): Payer: Medicare Other

## 2023-11-12 ENCOUNTER — Other Ambulatory Visit: Payer: Self-pay

## 2023-11-12 ENCOUNTER — Inpatient Hospital Stay (HOSPITAL_COMMUNITY): Payer: Medicare Other | Admitting: Physician Assistant

## 2023-11-12 ENCOUNTER — Inpatient Hospital Stay (HOSPITAL_COMMUNITY)
Admission: RE | Admit: 2023-11-12 | Discharge: 2023-11-13 | DRG: 467 | Disposition: A | Discharge status: Home / Self Care | Payer: Medicare Other | Attending: Orthopaedic Surgery | Admitting: Orthopaedic Surgery

## 2023-11-12 ENCOUNTER — Encounter (HOSPITAL_COMMUNITY): Payer: Self-pay | Admitting: Orthopaedic Surgery

## 2023-11-12 ENCOUNTER — Encounter (HOSPITAL_COMMUNITY)
Admission: RE | Disposition: A | Discharge status: Home / Self Care | Payer: Self-pay | Source: Home / Self Care | Attending: Orthopaedic Surgery

## 2023-11-12 DIAGNOSIS — Z833 Family history of diabetes mellitus: Secondary | ICD-10-CM | POA: Diagnosis not present

## 2023-11-12 DIAGNOSIS — Z96653 Presence of artificial knee joint, bilateral: Secondary | ICD-10-CM | POA: Diagnosis present

## 2023-11-12 DIAGNOSIS — Z8249 Family history of ischemic heart disease and other diseases of the circulatory system: Secondary | ICD-10-CM

## 2023-11-12 DIAGNOSIS — Z9181 History of falling: Secondary | ICD-10-CM | POA: Diagnosis not present

## 2023-11-12 DIAGNOSIS — Z981 Arthrodesis status: Secondary | ICD-10-CM

## 2023-11-12 DIAGNOSIS — Z7901 Long term (current) use of anticoagulants: Secondary | ICD-10-CM

## 2023-11-12 DIAGNOSIS — Z800 Family history of malignant neoplasm of digestive organs: Secondary | ICD-10-CM

## 2023-11-12 DIAGNOSIS — Z860100 Personal history of colon polyps, unspecified: Secondary | ICD-10-CM

## 2023-11-12 DIAGNOSIS — F419 Anxiety disorder, unspecified: Secondary | ICD-10-CM

## 2023-11-12 DIAGNOSIS — Z96641 Presence of right artificial hip joint: Secondary | ICD-10-CM

## 2023-11-12 DIAGNOSIS — Z952 Presence of prosthetic heart valve: Secondary | ICD-10-CM

## 2023-11-12 DIAGNOSIS — Z8616 Personal history of COVID-19: Secondary | ICD-10-CM | POA: Diagnosis not present

## 2023-11-12 DIAGNOSIS — T84060A Wear of articular bearing surface of internal prosthetic right hip joint, initial encounter: Secondary | ICD-10-CM | POA: Diagnosis present

## 2023-11-12 DIAGNOSIS — Y792 Prosthetic and other implants, materials and accessory orthopedic devices associated with adverse incidents: Secondary | ICD-10-CM | POA: Diagnosis present

## 2023-11-12 DIAGNOSIS — I5023 Acute on chronic systolic (congestive) heart failure: Secondary | ICD-10-CM | POA: Diagnosis not present

## 2023-11-12 DIAGNOSIS — T84020A Dislocation of internal right hip prosthesis, initial encounter: Secondary | ICD-10-CM

## 2023-11-12 DIAGNOSIS — I110 Hypertensive heart disease with heart failure: Secondary | ICD-10-CM | POA: Diagnosis present

## 2023-11-12 DIAGNOSIS — I4811 Longstanding persistent atrial fibrillation: Secondary | ICD-10-CM

## 2023-11-12 DIAGNOSIS — I420 Dilated cardiomyopathy: Secondary | ICD-10-CM

## 2023-11-12 DIAGNOSIS — Z96649 Presence of unspecified artificial hip joint: Principal | ICD-10-CM

## 2023-11-12 DIAGNOSIS — I5022 Chronic systolic (congestive) heart failure: Secondary | ICD-10-CM | POA: Diagnosis present

## 2023-11-12 DIAGNOSIS — Z01818 Encounter for other preprocedural examination: Secondary | ICD-10-CM

## 2023-11-12 HISTORY — PX: ANTERIOR HIP REVISION: SHX6527

## 2023-11-12 LAB — TYPE AND SCREEN
ABO/RH(D): O POS
Antibody Screen: NEGATIVE

## 2023-11-12 LAB — ABO/RH: ABO/RH(D): O POS

## 2023-11-12 LAB — PROTIME-INR
INR: 1.1 (ref 0.8–1.2)
Prothrombin Time: 13.9 s (ref 11.4–15.2)

## 2023-11-12 LAB — APTT: aPTT: 34 s (ref 24–36)

## 2023-11-12 SURGERY — REVISION, TOTAL ARTHROPLASTY, HIP, ANTERIOR APPROACH
Anesthesia: Spinal | Site: Hip | Laterality: Right

## 2023-11-12 MED ORDER — EPHEDRINE SULFATE (PRESSORS) 50 MG/ML IJ SOLN
INTRAMUSCULAR | Status: DC | PRN
  Administered 2023-11-12 (×2): 5 mg via INTRAVENOUS
  Administered 2023-11-12: 10 mg via INTRAVENOUS
  Administered 2023-11-12: 5 mg via INTRAVENOUS

## 2023-11-12 MED ORDER — SODIUM CHLORIDE 0.9 % IR SOLN
Status: DC | PRN
  Administered 2023-11-12: 1000 mL

## 2023-11-12 MED ORDER — TRANEXAMIC ACID-NACL 1000-0.7 MG/100ML-% IV SOLN
1000.0000 mg | INTRAVENOUS | Status: AC
Start: 2023-11-12 — End: 2023-11-12
  Administered 2023-11-12: 1000 mg via INTRAVENOUS
  Filled 2023-11-12: qty 100

## 2023-11-12 MED ORDER — WARFARIN SODIUM 5 MG PO TABS: 5.0000 mg | ORAL_TABLET | ORAL | Status: DC

## 2023-11-12 MED ORDER — ALPRAZOLAM 0.5 MG PO TABS
0.5000 mg | ORAL_TABLET | Freq: Two times a day (BID) | ORAL | Status: DC
  Administered 2023-11-12 – 2023-11-13 (×2): 0.5 mg via ORAL
  Filled 2023-11-12 (×2): qty 1

## 2023-11-12 MED ORDER — LACTATED RINGERS IV SOLN: INTRAVENOUS | Status: DC

## 2023-11-12 MED ORDER — PROPOFOL 500 MG/50ML IV EMUL
INTRAVENOUS | Status: DC | PRN
  Administered 2023-11-12: 100 ug/kg/min via INTRAVENOUS

## 2023-11-12 MED ORDER — ONDANSETRON HCL 4 MG PO TABS: 4.0000 mg | ORAL_TABLET | Freq: Four times a day (QID) | ORAL | Status: DC | PRN

## 2023-11-12 MED ORDER — WARFARIN SODIUM 5 MG PO TABS
7.5000 mg | ORAL_TABLET | ORAL | Status: DC
  Administered 2023-11-12: 7.5 mg via ORAL
  Filled 2023-11-12: qty 1

## 2023-11-12 MED ORDER — METOCLOPRAMIDE HCL 5 MG/ML IJ SOLN: 5.0000 mg | Freq: Three times a day (TID) | INTRAMUSCULAR | Status: DC | PRN

## 2023-11-12 MED ORDER — MIDAZOLAM HCL 2 MG/2ML IJ SOLN
INTRAMUSCULAR | Status: AC
  Filled 2023-11-12: qty 2

## 2023-11-12 MED ORDER — AMISULPRIDE (ANTIEMETIC) 5 MG/2ML IV SOLN: 10.0000 mg | Freq: Once | INTRAVENOUS | Status: DC | PRN

## 2023-11-12 MED ORDER — EPHEDRINE 5 MG/ML INJ
INTRAVENOUS | Status: AC
  Filled 2023-11-12: qty 5

## 2023-11-12 MED ORDER — PHENYLEPHRINE HCL-NACL 20-0.9 MG/250ML-% IV SOLN
INTRAVENOUS | Status: DC | PRN
  Administered 2023-11-12: 25 ug/min via INTRAVENOUS

## 2023-11-12 MED ORDER — ALUM & MAG HYDROXIDE-SIMETH 200-200-20 MG/5ML PO SUSP: 30.0000 mL | ORAL | Status: DC | PRN

## 2023-11-12 MED ORDER — SPIRONOLACTONE 25 MG PO TABS
25.0000 mg | ORAL_TABLET | Freq: Every day | ORAL | Status: DC
  Administered 2023-11-12 – 2023-11-13 (×2): 25 mg via ORAL
  Filled 2023-11-12 (×2): qty 1

## 2023-11-12 MED ORDER — OXYCODONE HCL 5 MG PO TABS
10.0000 mg | ORAL_TABLET | ORAL | Status: DC | PRN
  Administered 2023-11-12 (×2): 10 mg via ORAL
  Administered 2023-11-13: 15 mg via ORAL
  Administered 2023-11-13: 10 mg via ORAL
  Administered 2023-11-13: 15 mg via ORAL
  Filled 2023-11-12: qty 2
  Filled 2023-11-12: qty 3

## 2023-11-12 MED ORDER — SACUBITRIL-VALSARTAN 49-51 MG PO TABS
1.0000 | ORAL_TABLET | Freq: Two times a day (BID) | ORAL | Status: DC
Start: 2023-11-12 — End: 2023-11-13
  Administered 2023-11-12 – 2023-11-13 (×2): 1 via ORAL
  Filled 2023-11-12 (×2): qty 1

## 2023-11-12 MED ORDER — METHOCARBAMOL 1000 MG/10ML IJ SOLN: 500.0000 mg | Freq: Four times a day (QID) | INTRAMUSCULAR | Status: DC | PRN

## 2023-11-12 MED ORDER — PHENOL 1.4 % MT LIQD: 1.0000 | OROMUCOSAL | Status: DC | PRN

## 2023-11-12 MED ORDER — MECLIZINE HCL 25 MG PO TABS
50.0000 mg | ORAL_TABLET | Freq: Every day | ORAL | Status: DC | PRN
  Administered 2023-11-13: 50 mg via ORAL
  Filled 2023-11-12: qty 2

## 2023-11-12 MED ORDER — ONDANSETRON HCL 4 MG/2ML IJ SOLN: 4.0000 mg | Freq: Four times a day (QID) | INTRAMUSCULAR | Status: DC | PRN

## 2023-11-12 MED ORDER — PHENYLEPHRINE HCL (PRESSORS) 10 MG/ML IV SOLN
INTRAVENOUS | Status: AC
Start: 2023-11-12 — End: ?
  Filled 2023-11-12: qty 1

## 2023-11-12 MED ORDER — DEXAMETHASONE SODIUM PHOSPHATE 10 MG/ML IJ SOLN
INTRAMUSCULAR | Status: DC | PRN
  Administered 2023-11-12: 10 mg via INTRAVENOUS

## 2023-11-12 MED ORDER — ORAL CARE MOUTH RINSE: 15.0000 mL | Freq: Once | OROMUCOSAL | Status: AC

## 2023-11-12 MED ORDER — MENTHOL 3 MG MT LOZG: 1.0000 | LOZENGE | OROMUCOSAL | Status: DC | PRN

## 2023-11-12 MED ORDER — CHLORHEXIDINE GLUCONATE 0.12 % MT SOLN
15.0000 mL | Freq: Once | OROMUCOSAL | Status: AC
  Administered 2023-11-12: 15 mL via OROMUCOSAL

## 2023-11-12 MED ORDER — METHOCARBAMOL 500 MG PO TABS
ORAL_TABLET | ORAL | Status: AC
  Filled 2023-11-12: qty 1

## 2023-11-12 MED ORDER — PANTOPRAZOLE SODIUM 40 MG PO TBEC
40.0000 mg | DELAYED_RELEASE_TABLET | Freq: Every day | ORAL | Status: DC
  Administered 2023-11-12 – 2023-11-13 (×2): 40 mg via ORAL
  Filled 2023-11-12 (×2): qty 1

## 2023-11-12 MED ORDER — 0.9 % SODIUM CHLORIDE (POUR BTL) OPTIME
TOPICAL | Status: DC | PRN
  Administered 2023-11-12: 1000 mL

## 2023-11-12 MED ORDER — PHENYLEPHRINE HCL (PRESSORS) 10 MG/ML IV SOLN
INTRAVENOUS | Status: AC
  Filled 2023-11-12: qty 1

## 2023-11-12 MED ORDER — MELATONIN 5 MG PO TABS
10.0000 mg | ORAL_TABLET | Freq: Every day | ORAL | Status: DC
  Administered 2023-11-12: 10 mg via ORAL
  Filled 2023-11-12: qty 2

## 2023-11-12 MED ORDER — TORSEMIDE 20 MG PO TABS
20.0000 mg | ORAL_TABLET | Freq: Every day | ORAL | Status: DC
  Administered 2023-11-12: 20 mg via ORAL
  Filled 2023-11-12 (×2): qty 1

## 2023-11-12 MED ORDER — DEXAMETHASONE SODIUM PHOSPHATE 10 MG/ML IJ SOLN
INTRAMUSCULAR | Status: AC
  Filled 2023-11-12: qty 1

## 2023-11-12 MED ORDER — ACETAMINOPHEN 500 MG PO TABS
1000.0000 mg | ORAL_TABLET | Freq: Once | ORAL | Status: DC
  Filled 2023-11-12: qty 2

## 2023-11-12 MED ORDER — ONDANSETRON HCL 4 MG/2ML IJ SOLN: 4.0000 mg | Freq: Once | INTRAMUSCULAR | Status: DC | PRN

## 2023-11-12 MED ORDER — BUPIVACAINE IN DEXTROSE 0.75-8.25 % IT SOLN
INTRATHECAL | Status: DC | PRN
  Administered 2023-11-12: 2 mL via INTRATHECAL

## 2023-11-12 MED ORDER — METOPROLOL TARTRATE 25 MG PO TABS
25.0000 mg | ORAL_TABLET | Freq: Every day | ORAL | Status: DC
  Administered 2023-11-13: 25 mg via ORAL
  Filled 2023-11-12: qty 1

## 2023-11-12 MED ORDER — MIDAZOLAM HCL 2 MG/2ML IJ SOLN
INTRAMUSCULAR | Status: DC | PRN
  Administered 2023-11-12: 2 mg via INTRAVENOUS

## 2023-11-12 MED ORDER — WARFARIN - PHYSICIAN DOSING INPATIENT: Freq: Every day | Status: DC

## 2023-11-12 MED ORDER — STERILE WATER FOR IRRIGATION IR SOLN
Status: DC | PRN
  Administered 2023-11-12: 1000 mL

## 2023-11-12 MED ORDER — METHOCARBAMOL 500 MG PO TABS
500.0000 mg | ORAL_TABLET | Freq: Four times a day (QID) | ORAL | Status: DC | PRN
  Administered 2023-11-12 (×2): 500 mg via ORAL
  Filled 2023-11-12: qty 1

## 2023-11-12 MED ORDER — HYDROMORPHONE HCL 1 MG/ML IJ SOLN
0.5000 mg | INTRAMUSCULAR | Status: DC | PRN
  Administered 2023-11-12 – 2023-11-13 (×2): 1 mg via INTRAVENOUS
  Filled 2023-11-12 (×2): qty 1

## 2023-11-12 MED ORDER — METOCLOPRAMIDE HCL 5 MG PO TABS: 5.0000 mg | ORAL_TABLET | Freq: Three times a day (TID) | ORAL | Status: DC | PRN

## 2023-11-12 MED ORDER — OXYCODONE HCL 5 MG PO TABS
ORAL_TABLET | ORAL | Status: AC
  Filled 2023-11-12: qty 2

## 2023-11-12 MED ORDER — ONDANSETRON HCL 4 MG/2ML IJ SOLN
INTRAMUSCULAR | Status: AC
  Filled 2023-11-12: qty 2

## 2023-11-12 MED ORDER — ONDANSETRON HCL 4 MG/2ML IJ SOLN
INTRAMUSCULAR | Status: DC | PRN
  Administered 2023-11-12: 4 mg via INTRAVENOUS

## 2023-11-12 MED ORDER — CEFAZOLIN SODIUM-DEXTROSE 2-4 GM/100ML-% IV SOLN
2.0000 g | INTRAVENOUS | Status: AC
  Administered 2023-11-12: 2 g via INTRAVENOUS
  Filled 2023-11-12: qty 100

## 2023-11-12 MED ORDER — DIPHENHYDRAMINE HCL 12.5 MG/5ML PO ELIX: 12.5000 mg | ORAL_SOLUTION | ORAL | Status: DC | PRN

## 2023-11-12 MED ORDER — SODIUM CHLORIDE 0.9 % IV SOLN: INTRAVENOUS | Status: DC

## 2023-11-12 MED ORDER — FENTANYL CITRATE PF 50 MCG/ML IJ SOSY
25.0000 ug | PREFILLED_SYRINGE | INTRAMUSCULAR | Status: DC | PRN
  Administered 2023-11-12: 50 ug via INTRAVENOUS

## 2023-11-12 MED ORDER — ACETAMINOPHEN 325 MG PO TABS: 325.0000 mg | ORAL_TABLET | Freq: Four times a day (QID) | ORAL | Status: DC | PRN

## 2023-11-12 MED ORDER — OXYCODONE HCL 5 MG PO TABS
5.0000 mg | ORAL_TABLET | ORAL | Status: DC | PRN
Start: 2023-11-12 — End: 2023-11-13
  Filled 2023-11-12: qty 2
  Filled 2023-11-12: qty 1
  Filled 2023-11-12: qty 2

## 2023-11-12 MED ORDER — FENTANYL CITRATE PF 50 MCG/ML IJ SOSY
PREFILLED_SYRINGE | INTRAMUSCULAR | Status: AC
  Filled 2023-11-12: qty 1

## 2023-11-12 SURGICAL SUPPLY — 35 items
ARTICULEZE HEAD 36 12 (Hips) ×1 IMPLANT
BAG COUNTER SPONGE SURGICOUNT (BAG) IMPLANT
BAG ZIPLOCK 12X15 (MISCELLANEOUS) IMPLANT
BENZOIN TINCTURE PRP APPL 2/3 (GAUZE/BANDAGES/DRESSINGS) IMPLANT
BLADE SAW SGTL 18X1.27X75 (BLADE) ×1 IMPLANT
BRUSH FEMORAL CANAL (MISCELLANEOUS) ×1 IMPLANT
COVER PERINEAL POST (MISCELLANEOUS) ×1 IMPLANT
COVER SURGICAL LIGHT HANDLE (MISCELLANEOUS) ×1 IMPLANT
DRAPE FOOT SWITCH (DRAPES) ×1 IMPLANT
DRAPE STERI IOBAN 125X83 (DRAPES) ×1 IMPLANT
DRAPE U-SHAPE 47X51 STRL (DRAPES) ×2 IMPLANT
DRSG AQUACEL AG ADV 3.5X10 (GAUZE/BANDAGES/DRESSINGS) ×1 IMPLANT
DURAPREP 26ML APPLICATOR (WOUND CARE) ×1 IMPLANT
ELECT REM PT RETURN 15FT ADLT (MISCELLANEOUS) ×1 IMPLANT
GAUZE XEROFORM 1X8 LF (GAUZE/BANDAGES/DRESSINGS) IMPLANT
GLOVE BIO SURGEON STRL SZ7.5 (GLOVE) ×1 IMPLANT
GLOVE BIOGEL PI IND STRL 8 (GLOVE) ×2 IMPLANT
GLOVE ECLIPSE 8.0 STRL XLNG CF (GLOVE) ×1 IMPLANT
GOWN STRL REUS W/ TWL XL LVL3 (GOWN DISPOSABLE) ×2 IMPLANT
HEAD ARTICULEZE 36 12 (Hips) IMPLANT
HOLDER FOLEY CATH W/STRAP (MISCELLANEOUS) ×1 IMPLANT
KIT TURNOVER KIT A (KITS) IMPLANT
PACK ANTERIOR HIP CUSTOM (KITS) ×1 IMPLANT
PIN ESC CONSTR ACE LINER 36X56 (Liner) IMPLANT
SET HNDPC FAN SPRY TIP SCT (DISPOSABLE) ×1 IMPLANT
STAPLER SKIN PROX WIDE 3.9 (STAPLE) IMPLANT
STRIP CLOSURE SKIN 1/2X4 (GAUZE/BANDAGES/DRESSINGS) IMPLANT
SUT ETHIBOND NAB CT1 #1 30IN (SUTURE) ×1 IMPLANT
SUT MNCRL AB 4-0 PS2 18 (SUTURE) IMPLANT
SUT VIC AB 0 CT1 36 (SUTURE) ×1 IMPLANT
SUT VIC AB 1 CT1 36 (SUTURE) ×1 IMPLANT
SUT VIC AB 2-0 CT1 TAPERPNT 27 (SUTURE) ×2 IMPLANT
TRAY FOLEY MTR SLVR 16FR STAT (SET/KITS/TRAYS/PACK) ×1 IMPLANT
WATER STERILE IRR 1000ML POUR (IV SOLUTION) ×2 IMPLANT
YANKAUER SUCT BULB TIP NO VENT (SUCTIONS) ×1 IMPLANT

## 2023-11-12 NOTE — Discharge Instructions (Signed)
 You can slowly increase your activities as comfort allows and put full weight on your right hip. Try to avoid bending way over at the waist and significantly flexing your right hip and knee. The current dressing can get wet in the shower.

## 2023-11-12 NOTE — Plan of Care (Signed)
  Problem: Coping: Goal: Level of anxiety will decrease Outcome: Progressing   Problem: Pain Management: Goal: General experience of comfort will improve Outcome: Progressing   Problem: Safety: Goal: Ability to remain free from injury will improve Outcome: Progressing   Problem: Skin Integrity: Goal: Risk for impaired skin integrity will decrease Outcome: Progressing

## 2023-11-12 NOTE — Transfer of Care (Signed)
 Immediate Anesthesia Transfer of Care Note  Patient: [name]  Procedure(s) Performed: REVISION OF RIGHT TOTAL HIP ARTHROPLASTY (Right: Hip)  Patient Location: PACU  Anesthesia Type:MAC combined with regional for post-op pain  Level of Consciousness: sedated, drowsy, and patient cooperative  Airway & Oxygen Therapy: Patient connected to face mask oxygen  Post-op Assessment: Report given to RN and Post -op Vital signs reviewed and stable  Post vital signs: stable  Last Vitals:  Vitals Value Taken Time  BP    Temp    Pulse    Resp    SpO2      Last Pain:  Vitals:   11/12/23 1055  TempSrc:   PainSc: 0-No pain         Complications: No notable events documented.

## 2023-11-12 NOTE — Interval H&P Note (Signed)
 History and Physical Interval Note: Patient understands that we are recommending proceeding to surgery today due to his recurrent dislocations and instability of his right total hip arthroplasty.  Our plan is to assess the components and then make adjustments as needed in order to have a stable hip.  This can include a constrained liner and lengthening his hip with a longer hip ball as well.  The risks and benefits of surgery have been discussed in detail.  There has been no acute changes in medical status.  Informed consent has been obtained and the right operative hip has been marked.  11/12/2023 11:11 AM  [name]  has presented today for surgery, with the diagnosis of Recurrent Dislocation Right Total Hip.  The various methods of treatment have been discussed with the patient and family. After consideration of risks, benefits and other options for treatment, the patient has consented to  Procedure(s): REVISION OF RIGHT TOTAL HIP ARTHROPLASTY (Right) as a surgical intervention.  The patient's history has been reviewed, patient examined, no change in status, stable for surgery.  I have reviewed the patient's chart and labs.  Questions were answered to the patient's satisfaction.     [name]

## 2023-11-12 NOTE — Anesthesia Procedure Notes (Signed)
 Spinal  Patient location during procedure: OR Start time: 11/12/2023 12:45 PM End time: 11/12/2023 12:48 PM Reason for block: surgical anesthesia Staffing Performed: anesthesiologist  Anesthesiologist: Corinne Garnette BRAVO, MD Performed by: Corinne Garnette BRAVO, MD Authorized by: Corinne Garnette BRAVO, MD   Preanesthetic Checklist Completed: patient identified, IV checked, risks and benefits discussed, surgical consent, monitors and equipment checked, pre-op evaluation and timeout performed Spinal Block Patient position: sitting Prep: DuraPrep and site prepped and draped Patient monitoring: continuous pulse ox and blood pressure Approach: midline Location: L3-4 Injection technique: single-shot Needle Needle type: Pencan  Needle gauge: 24 G Assessment Events: CSF return Additional Notes Functioning IV was confirmed and monitors were applied. Sterile prep and drape, including hand hygiene, mask and sterile gloves were used. The patient was positioned and the spine was prepped. The skin was anesthetized with lidocaine .  Free flow of clear CSF was obtained prior to injecting local anesthetic into the CSF.  The spinal needle aspirated freely following injection.  The needle was carefully withdrawn.  The patient tolerated the procedure well. Consent was obtained prior to procedure with all questions answered and concerns addressed. Risks including but not limited to bleeding, infection, nerve damage, paralysis, failed block, inadequate analgesia, allergic reaction, high spinal, itching and headache were discussed and the patient wished to proceed.   Garnette Corinne, MD

## 2023-11-12 NOTE — Op Note (Signed)
 Operative Note  Date of operation: 11/12/2023 Preoperative diagnosis: Unstable right total hip arthroplasty with recurrent dislocation Postoperative diagnosis: Same  Procedure: Right hip revision arthroplasty with revising to a constrained liner and longer hip ball  Implants: Implant Name Type Inv. Item Serial No. Manufacturer Lot No. LRB No. Used Action  PIN ESC CONSTR ACE LINER [id number] - [id number] Liner PIN ESC CONSTR ACE LINER [id number]  [employer] [id number] Right 1 Implanted  ARTICULEZE HEAD 36 12 - [id number] Hips ARTICULEZE HEAD 36 12  DEPU[employer] ORTH[employer] [id number] Right 1 Implanted   Surgeon: Lonni GRADE. Vernetta, MD Assistant: Tory Gaskins, PA-C  Anesthesia: Spinal Antibiotics: IV Ancef  EBL: 200 cc Complications: None  Indications: [name] is a 68 year old gentleman well-known to me.  We replaced his right hip through an anterior approach back in 2012.  It is almost been 13 years.  He had been doing well and had never had issues with his right hip.  Last month he has sustained a very hard mechanical fall in his kitchen landing hard on his right hip.  He is 6 foot 3 inches tall and a solid individual.  His hip dislocated when he fell and hit the ground hard.  He was seen in the emergency room and the hip was reduced under sedation by the ER staff.  2 weeks later he was bending over and the hip dislocated again.  I talked him in length and I feel this is an unstable hip situation.  Radiographs show no evidence of loosening of the femoral or acetabular component.  Surprisingly his leg lengths are near equal with the components in place.  The femoral component is down to the calcar but again all components appear to be bone ingrown.  At this point I have recommended revising him to a constrained liner with a longer hip ball.  His previous liner is a 36+4 for a size 56 acetabular component in the femoral head is a ceramic 36+8.5 length.  We talked about the risks of acute blood loss anemia,  nerve or vessel injury, infection, DVT, recurrent dislocation, implant failure, leg length differences and wound healing issues.  Procedure description: After informed consent was obtained and the appropriate right hip was marked, the patient was brought to the operating room and set up on the stretcher where spinal anesthesia was obtained.  He was then laid in supine position on the stretcher and a Foley catheter was placed.  Traction boots were placed on both his feet and next he was placed supine on the Hana fracture table with a perineal post in place in both legs and inline skeletal traction devices no traction applied.  His right hip and pelvis again were assessed radiographically.  I saw no evidence of fracture.  The right hip was prepped and draped with DuraPrep and sterile drapes.  Timeout is called and he identifies correct patient correct right hip.  We then made incision just inferior and posterior to the ASIS and carried slightly obliquely down the leg.  Dissection was carried down to the tensor fascia lata muscle and tensor fascia was divided longitudinally to proceed with a direct interposed the hip.  We were able to then identify the old hip capsule and sutures in place and we had to excise the hip capsule removing sutures to expose the hip.  There was no evidence of metallosis.  There was no hip joint effusion and surprising no hemarthrosis.  The hip was well located.  Once removed some more scar tissue we  were able to provide traction and external rotation and dislocated the hip.  We removed the previous 36+8.5 ceramic head ball and assessed the femoral component and there was no evidence of loosening.  We remove the polyethylene liner and did find some poly wear but no evidence of myelosis in the acetabulum plan is well-seated with no evidence infection.  At this point we decided to proceed with placing constrained liner.  We irrigated the hip with normal saline solution using pulsatile lavage.   We then placed a [employer]acle constrained liner polyethylene component for a 56*component which was a 36+4 liner.  We then went with a 36+12 metal hip ball.  We were able to reduce this into the constrained liner and then placed the locking ring around the constrained liner.  We assessed the hip clinically and radiographically and it felt tight and we did not sense that there was any instability.  We confirmed placement radiographically of all components as well.  We then irrigated this issue again with normal saline solution and then closed remnants of the soft tissue over the joint with interrupted #1 Ethibond suture.  #1 Vicryl was used to close the tensor fascia with 0 Vicryl close the deep tissue and 2-0 Vicryl used to close the subcutaneous tissue.  The skin was closed with staples.  Aquacel dressing was applied.  The patient was taken off the Hana table and taken to recovery room in stable condition.  Tory Gaskins, PA-C did assist during entire case and beginning to end and his assistance was crucial and medically necessary for soft tissue management and retraction, helping guide implant placement and a layered closure of the wound.

## 2023-11-13 LAB — BASIC METABOLIC PANEL
Anion gap: 9 (ref 5–15)
BUN: 17 mg/dL (ref 8–23)
CO2: 26 mmol/L (ref 22–32)
Calcium: 8.3 mg/dL — ABNORMAL LOW (ref 8.9–10.3)
Chloride: 102 mmol/L (ref 98–111)
Creatinine, Ser: 0.89 mg/dL (ref 0.61–1.24)
GFR, Estimated: >60 mL/min (ref >60)
Glucose, Bld: 125 mg/dL — ABNORMAL HIGH (ref 70–99)
Potassium: 4.2 mmol/L (ref 3.5–5.1)
Sodium: 137 mmol/L (ref 135–145)

## 2023-11-13 LAB — CBC
HCT: 39.2 % (ref 39.0–52.0)
Hemoglobin: 12.6 g/dL — ABNORMAL LOW (ref 13.0–17.0)
MCH: 31.3 pg (ref 26.0–34.0)
MCHC: 32.1 g/dL (ref 30.0–36.0)
MCV: 97.3 fL (ref 80.0–100.0)
Platelets: 175 10*3/uL (ref 150–400)
RBC: 4.03 MIL/uL — ABNORMAL LOW (ref 4.22–5.81)
RDW: 12.6 % (ref 11.5–15.5)
WBC: 10.7 10*3/uL — ABNORMAL HIGH (ref 4.0–10.5)
nRBC: 0 % (ref 0.0–0.2)

## 2023-11-13 LAB — PROTIME-INR
INR: 1.1 (ref 0.8–1.2)
Prothrombin Time: 14.3 s (ref 11.4–15.2)

## 2023-11-13 MED ORDER — TIZANIDINE HCL 4 MG PO TABS: 4.0000 mg | ORAL_TABLET | Freq: Three times a day (TID) | ORAL | 0 refills | Status: DC | PRN

## 2023-11-13 NOTE — TOC Transition Note (Addendum)
 Transition of Care [facility]) - Discharge Note   Patient Details  Name: [name] MRN: [id number] Date of Birth: May 15, 1956  Transition of Care [facility] CM/SW Contact:  Tawni CHRISTELLA Eva, LCSW Phone Number: 11/13/2023, 11:51 AM   Clinical Narrative:    CSW met with pt to discuss recommended DME , pt has declined stating he has everything he needs at home. Pt's home health is prearranged with [facility]. Pt with no transportation needs. No further TOC needs TOC sign off.     Final next level of care: Home/Self Care Barriers to Discharge: No Barriers Identified   Patient Goals and CMS Choice Patient states their goals for this hospitalization and ongoing recovery are:: return home          Discharge Placement                       Discharge Plan and Services Additional resources added to the After Visit Summary for                                       Social Drivers of Health (SDOH) Interventions SDOH Screenings   Food Insecurity: No Food Insecurity (11/12/2023)  Housing: Low Risk  (11/12/2023)  Transportation Needs: No Transportation Needs (11/12/2023)  Utilities: Not At Risk (11/12/2023)  Financial Resource Strain: Low Risk  (10/14/2023)   Received from [facility]  Physical Activity: Inactive (10/14/2023)   [facility] from [facility]  Social Connections: Moderately Integrated (11/13/2023)  Stress: Stress Concern Present (10/14/2023)   Received from [facility]  Tobacco Use: Low Risk  (11/12/2023)     Readmission Risk Interventions     No data to display

## 2023-11-13 NOTE — Evaluation (Signed)
 Physical Therapy Evaluation Patient Details Name: [name] MRN: [id number] DOB: Jul 31, 1956 Today's Date: 11/13/2023  History of Present Illness  Pt s/p revision of R THR to constrained liner and longer hip ball following fall and recurrent dislocations.  Pt with hx of R THR (12), bil TKR, , CHF, afib, and dilated cardiomyopathy  Clinical Impression  Pt admitted as above and presenting with functional mobility limitations 2* decreased R LE strength/ROM, post op pain and posterior THP.  Pt should progress to dc home with family assist.        If plan is discharge home, recommend the following: A little help with walking and/or transfers;A little help with bathing/dressing/bathroom;Assistance with cooking/housework;Assist for transportation;Help with stairs or ramp for entrance   Can travel by private vehicle        Equipment Recommendations None recommended by PT  Recommendations for Other Services       Functional Status Assessment Patient has had a recent decline in their functional status and demonstrates the ability to make significant improvements in function in a reasonable and predictable amount of time.     Precautions / Restrictions Precautions Precautions: Fall;Posterior Hip Precaution Booklet Issued: Yes (comment) Precaution Comments: Per Dr Vernetta on phone pt to follow posterior THP rather than ant THP in orders. Restrictions Weight Bearing Restrictions Per Provider Order: No RLE Weight Bearing Per Provider Order: Weight bearing as tolerated      Mobility  Bed Mobility Overal bed mobility: Needs Assistance Bed Mobility: Supine to Sit, Sit to Supine     Supine to sit: Contact guard Sit to supine: Min assist   General bed mobility comments: cues for sequence and use of R LE to self assist    Transfers Overall transfer level: Needs assistance Equipment used: Rolling walker (2 wheels) Transfers: Sit to/from Stand Sit to Stand: Min assist            General transfer comment: Steady assist wtih cues for LE management and use of UEs to self assist    Ambulation/Gait Ambulation/Gait assistance: Min assist Gait Distance (Feet): 35 Feet Assistive device: Rolling walker (2 wheels) Gait Pattern/deviations: Step-to pattern, Decreased step length - right, Decreased step length - left, Shuffle, Trunk flexed Gait velocity: decr     General Gait Details: cues for sequence, posture and position from RW; distance limited by c/o increasing dizziness  Stairs            Wheelchair Mobility     Tilt Bed    Modified Rankin (Stroke Patients Only)       Balance Overall balance assessment: Mild deficits observed, not formally tested                                           Pertinent Vitals/Pain Pain Assessment Pain Assessment: 0-10 Pain Score: 6  Pain Location: R hip with activity Pain Descriptors / Indicators: Aching, Sore Pain Intervention(s): Limited activity within patient's tolerance, Monitored during session, Premedicated before session, Ice applied    Home Living Family/patient expects to be discharged to:: Private residence Living Arrangements: Spouse/significant other Available Help at Discharge: Family Type of Home: House Home Access: Stairs to enter Entrance Stairs-Rails: Right;Left;Can reach both Secretary/administrator of Steps: 5   Home Layout: One level Home Equipment: Agricultural Consultant (2 wheels);BSC/3in1;Cane - single point      Prior Function Prior Level of Function : Independent/Modified  Independent                     Extremity/Trunk Assessment   Upper Extremity Assessment Upper Extremity Assessment: Overall WFL for tasks assessed    Lower Extremity Assessment Lower Extremity Assessment: RLE deficits/detail RLE Deficits / Details: AAROM at hip to 90 flex    Cervical / Trunk Assessment Cervical / Trunk Assessment: Normal  Communication   Communication Communication:  No apparent difficulties Cueing Techniques: Verbal cues;Gestural cues  Cognition Arousal: Alert Behavior During Therapy: WFL for tasks assessed/performed Overall Cognitive Status: Within Functional Limits for tasks assessed                                          General Comments      Exercises Total Joint Exercises Ankle Circles/Pumps: AROM, Both, 15 reps, Supine Quad Sets: AROM, Both, 10 reps, Supine Heel Slides: AAROM, Right, 20 reps, Supine   Assessment/Plan    PT Assessment Patient needs continued PT services  PT Problem List Decreased strength;Decreased range of motion;Decreased activity tolerance;Decreased balance;Decreased mobility;Decreased knowledge of use of DME;Pain       PT Treatment Interventions DME instruction;Gait training;Stair training;Functional mobility training;Therapeutic activities;Therapeutic exercise;Patient/family education    PT Goals (Current goals can be found in the Care Plan section)  Acute Rehab PT Goals Patient Stated Goal: Regain IND PT Goal Formulation: With patient Time For Goal Achievement: 11/27/23 Potential to Achieve Goals: Good    Frequency 7X/week     Co-evaluation               AM-PAC PT 6 Clicks Mobility  Outcome Measure Help needed turning from your back to your side while in a flat bed without using bedrails?: A Little Help needed moving from lying on your back to sitting on the side of a flat bed without using bedrails?: A Little Help needed moving to and from a bed to a chair (including a wheelchair)?: A Little Help needed standing up from a chair using your arms (e.g., wheelchair or bedside chair)?: A Little Help needed to walk in hospital room?: A Little Help needed climbing 3-5 steps with a railing? : A Lot 6 Click Score: 17    End of Session Equipment Utilized During Treatment: Gait belt Activity Tolerance: Other (comment) (dizziness with activity) Patient left: in bed;with call  bell/phone within reach;with family/visitor present Nurse Communication: Mobility status PT Visit Diagnosis: Difficulty in walking, not elsewhere classified (R26.2)    Time: 9167-9089 PT Time Calculation (min) (ACUTE ONLY): 38 min   Charges:   PT Evaluation $PT Eval Low Complexity: 1 Low PT Treatments $Gait Training: 8-22 mins PT General Charges $$ ACUTE PT VISIT: 1 Visit         Katrinka Acton PT Acute Rehabilitation Services Pager [phone] Office [phone]   [name] 11/13/2023, 9:45 AM

## 2023-11-13 NOTE — Progress Notes (Signed)
 Physical Therapy Treatment Patient Details Name: [name] MRN: [id number] DOB: [birth year] Today's Date: 11/13/2023   History of Present Illness Pt s/p revision of R THR to constrained liner and longer hip ball following fall and recurrent dislocations.  Pt with hx of R THR (12), bil TKR, , CHF, afib, and dilated cardiomyopathy    PT Comments  Pt motivated and progressing well with mobility and with no reports of dizziness.  Pt performed therex program with written instruction provided and reviewed; up to ambulate in hall, negotiated stairs, and reviewed car transfers.  Pt eager for dc home this date.    If plan is discharge home, recommend the following: A little help with walking and/or transfers;A little help with bathing/dressing/bathroom;Assistance with cooking/housework;Assist for transportation;Help with stairs or ramp for entrance   Can travel by private vehicle        Equipment Recommendations  None recommended by PT    Recommendations for Other Services       Precautions / Restrictions Precautions Precautions: Fall;Posterior Hip Precaution Booklet Issued: Yes (comment) Precaution Comments: Written instructions provided and reviewed Restrictions Weight Bearing Restrictions Per Provider Order: No RLE Weight Bearing Per Provider Order: Weight bearing as tolerated     Mobility  Bed Mobility Overal bed mobility: Needs Assistance Bed Mobility: Supine to Sit, Sit to Supine     Supine to sit: Supervision Sit to supine: Supervision   General bed mobility comments: cues for sequence and use of R LE to self assist    Transfers Overall transfer level: Needs assistance Equipment used: Rolling walker (2 wheels) Transfers: Sit to/from Stand Sit to Stand: Contact guard assist, Supervision           General transfer comment: Steady assist wtih cues for LE management and use of UEs to self assist    Ambulation/Gait Ambulation/Gait assistance: Contact guard  assist, Supervision Gait Distance (Feet): 120 Feet Assistive device: Rolling walker (2 wheels) Gait Pattern/deviations: Step-to pattern, Decreased step length - right, Decreased step length - left, Shuffle, Trunk flexed Gait velocity: decr     General Gait Details: min cues for sequence, posture and position from RW; no c/o dizziness   Stairs Stairs: Yes Stairs assistance: Contact guard assist Stair Management: Two rails, Step to pattern, Forwards Number of Stairs: 5 General stair comments: cues for sequence   Wheelchair Mobility     Tilt Bed    Modified Rankin (Stroke Patients Only)       Balance Overall balance assessment: Mild deficits observed, not formally tested                                          Cognition Arousal: Alert Behavior During Therapy: WFL for tasks assessed/performed Overall Cognitive Status: Within Functional Limits for tasks assessed                                          Exercises Total Joint Exercises Ankle Circles/Pumps: AROM, Both, 15 reps, Supine Quad Sets: AROM, Both, 10 reps, Supine Heel Slides: AAROM, Right, 20 reps, Supine    General Comments        Pertinent Vitals/Pain Pain Assessment Pain Assessment: 0-10 Pain Score: 5  Pain Location: R hip with activity Pain Descriptors / Indicators: Aching, Sore Pain Intervention(s): Limited activity within  patient's tolerance, Monitored during session, Premedicated before session, Ice applied    Home Living                          Prior Function            PT Goals (current goals can now be found in the care plan section) Acute Rehab PT Goals Patient Stated Goal: Regain IND PT Goal Formulation: With patient Time For Goal Achievement: 11/27/23 Potential to Achieve Goals: Good Progress towards PT goals: Progressing toward goals    Frequency    7X/week      PT Plan      Co-evaluation              AM-PAC PT 6  Clicks Mobility   Outcome Measure  Help needed turning from your back to your side while in a flat bed without using bedrails?: A Little Help needed moving from lying on your back to sitting on the side of a flat bed without using bedrails?: A Little Help needed moving to and from a bed to a chair (including a wheelchair)?: A Little Help needed standing up from a chair using your arms (e.g., wheelchair or bedside chair)?: A Little Help needed to walk in hospital room?: A Little Help needed climbing 3-5 steps with a railing? : A Little 6 Click Score: 18    End of Session Equipment Utilized During Treatment: Gait belt Activity Tolerance: Patient tolerated treatment well Patient left: in bed;with call bell/phone within reach;with family/visitor present Nurse Communication: Mobility status PT Visit Diagnosis: Difficulty in walking, not elsewhere classified (R26.2)     Time: 1320-1400 PT Time Calculation (min) (ACUTE ONLY): 40 min  Charges:    $Gait Training: 8-22 mins $Therapeutic Exercise: 8-22 mins $Therapeutic Activity: 8-22 mins PT General Charges $$ ACUTE PT VISIT: 1 Visit                     Katrinka Acton PT Acute Rehabilitation Services Pager [phone] Office [phone]    Navi Erber 11/13/2023, 4:47 PM

## 2023-11-13 NOTE — Progress Notes (Signed)
 Orthopedic Surgery Progress Note   Assessment: Patient is a 68 y.o. male with right THA recurrent dislocation status post revision arthroplasty   Plan: -Operative plans: complete -Diet: regular -DVT ppx: warfarin -Weight bearing status: as tolerated (avoid hyperextension, external rotation, or abduction) -PT/OT evaluate and treat -Pain control -Possible discharge to home, pending PT  ___________________________________________________________________________  Subjective: No acute events overnight. Pain well controlled. Feels he is able to do more and is having less pain than after his original hip replacement. Has not worked with PT yet or tried ambulating. Wants to go home today if possible.    Physical Exam:  General: no acute distress, appears stated age Neurologic: alert, answering questions appropriately, following commands Respiratory: unlabored breathing on room air, symmetric chest rise Psychiatric: appropriate affect, normal cadence to speech  MSK:   -Right lower extremity  Dressing over hip c/d/i EHL/TA/GSC intact Plantarflexes and dorsiflexes toes Sensation intact to light touch in sural, saphenous, tibial, deep peroneal, and superficial peroneal nerve distributions Foot warm and well perfused   Patient name: [name] Patient MRN: [id number] Date: 11/13/23

## 2023-11-13 NOTE — Progress Notes (Signed)
Patient discharged to home w/ family. Given all belongings, instructions. Verbalized understanding of instructions. Escorted to pov via w/c. 

## 2023-11-13 NOTE — Discharge Summary (Signed)
 Patient ID: [name] MRN: [id number] DOB/AGE: Apr 12, 1956 68 y.o.  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 [facility])   Discharge Diagnoses:  Same  Past Medical History:  Diagnosis Date   Acute on chronic systolic (congestive) heart failure (HCC) 12/30/2018   Allergy    Anxiety    Arthritis    Atrial flutter with rapid ventricular response (HCC)    Chronic systolic (congestive) heart failure (HCC)    COVID 10/2021   mild   Dilated aortic root (HCC) 01/04/2019   Dilated cardiomyopathy (HCC) 01/04/2019   Dysrhythmia    Heart murmur    History of blood transfusion    01/27/22   History of colon polyps    Hypertension    Insomnia    Mitral valve prolapse    Mitral valve regurgitation    Persistent atrial fibrillation (HCC) 12/02/2018   S/P Maze operation for atrial fibrillation 03/21/2019   Complete bilateral atrial lesion set using cryothermy and bipolar radiofrequency ablation with clipping of LA appendage via right mini thoracotomy approach   S/P minimally invasive mitral valve repair 03/21/2019   Complex valvuloplasty including triangular resection of posterior leaflet, artificial Gore-tex neochord placement x4 and 36 mm Sorin Memo 4D ring annuloplasty via right mini thoracotomy approach   Seizures (HCC)    had one approx. 30 yrs ago,has not had any since; due to alchol and no sleep   Severe mitral regurgitation     Surgeries: Procedure(s): REVISION OF RIGHT TOTAL HIP ARTHROPLASTY on 11/12/2023   Consultants:   Discharged Condition: Improved  Hospital Course: MARQUAVION VENHUIZEN is an 68 y.o. male who was admitted 11/12/2023 for operative treatment ofStatus post revision of total hip. Patient has severe unremitting pain that affects sleep, daily activities, and work/hobbies. After pre-op clearance the patient was  taken to the operating room on 11/12/2023 and underwent  Procedure(s): REVISION OF RIGHT TOTAL HIP ARTHROPLASTY.    Patient was given perioperative antibiotics:  Anti-infectives (From admission, onward)    Start     Dose/Rate Route Frequency Ordered Stop   11/12/23 1015  ceFAZolin  (ANCEF ) IVPB 2g/100 mL premix        2 g 200 mL/hr over 30 Minutes Intravenous On call to O.R. 11/12/23 1013 11/12/23 1250        Patient was given sequential compression devices, early ambulation, and chemoprophylaxis to prevent DVT.  Patient benefited maximally from hospital stay and there were no complications.    Recent vital signs: Patient Vitals for the past 24 hrs:  BP Temp Temp src Pulse Resp SpO2  11/13/23 1313 110/74 98.6 F (37 C) Oral 65 16 96 %  11/13/23 0933 (!) 123/96 98.9 F (37.2 C) Oral 80 17 97 %  11/13/23 0535 121/69 98.3 F (36.8 C) Oral 69 18 96 %  11/13/23 0143 112/69 97.9 F (36.6 C) Oral 77 18 96 %  11/12/23 2208 130/75 99.1 F (37.3 C) Oral 82 18 93 %     Recent laboratory studies:  Recent Labs    11/12/23 1047 11/13/23 0428  WBC  --  10.7*  HGB  --  12.6*  HCT  --  39.2  PLT  --  175  NA  --  137  K  --  4.2  CL  --  102  CO2  --  26  BUN  --  17  CREATININE  --  0.89  GLUCOSE  --  125*  INR 1.1 1.1  CALCIUM   --  8.3*     Discharge Medications:   Allergies as of 11/13/2023   No Known Allergies      Medication List     TAKE these medications    ALPRAZolam  0.5 MG tablet Commonly known as: XANAX  Take 0.5 mg by mouth 2 (two) times daily.   atorvastatin  20 MG tablet Commonly known as: LIPITOR Take 20 mg by mouth every morning.   bismuth  subsalicylate 262 MG/15ML suspension Commonly known as: PEPTO BISMOL Take 30 mLs by mouth every 6 (six) hours as needed for indigestion or diarrhea or loose stools.   DSS 100 MG Caps Take 300 mg by mouth every morning.   Entresto  49-51 MG Generic drug: sacubitril -valsartan  Take 1 tablet by mouth 2 (two)  times daily.   meclizine  25 MG tablet Commonly known as: ANTIVERT  Take 50 mg by mouth daily as needed for dizziness.   Melatonin 10 MG Tabs Take 10 mg by mouth at bedtime.   metoprolol  tartrate 25 MG tablet Commonly known as: LOPRESSOR  Take 25 mg by mouth daily.   multivitamin with minerals Tabs tablet Take 1 tablet by mouth every morning.   oxyCODONE -acetaminophen  10-325 MG tablet Commonly known as: PERCOCET Take 1-1.5 tablets by mouth in the morning, at noon, in the evening, and at bedtime.   silver  sulfADIAZINE  1 % cream Commonly known as: SILVADENE  Apply 1 Application topically daily. Apply to affected area daily plus dry dressing What changed:  when to take this additional instructions   spironolactone  25 MG tablet Commonly known as: ALDACTONE  Take 1 tablet (25 mg total) by mouth daily.   tiZANidine  4 MG tablet Commonly known as: ZANAFLEX  Take 1 tablet (4 mg total) by mouth every 8 (eight) hours as needed for muscle spasms.   torsemide  20 MG tablet Commonly known as: DEMADEX  TAKE 1 TABLET EVERY DAY   warfarin 5 MG tablet Commonly known as: COUMADIN  Take as directed. If you are unsure how to take this medication, talk to your nurse or doctor. Original instructions: TAKE 1 TABLET EVERY DAY EXCEPT TAKE 1 AND 1/2 TABLETS ON WEDNESDAY AS INSTRUCTED What changed:  how much to take how to take this when to take this additional instructions               Durable Medical Equipment  (From admission, onward)           Start     Ordered   11/12/23 1638  DME 3 n 1  Once        11/12/23 1637   11/12/23 1638  DME Walker rolling  Once       Question Answer Comment  Walker: With 5 Inch Wheels   Patient needs a walker to treat with the following condition Status post revision of total hip      11/12/23 1637            Diagnostic Studies: DG HIP UNILAT WITH PELVIS 1V RIGHT Result Date: 11/12/2023 CLINICAL DATA:  Total right hip arthroplasty elective  surgery. Intraoperative fluoroscopy. EXAM: DG HIP (WITH OR WITHOUT PELVIS) 1V RIGHT COMPARISON:  Right hip radiographs 10/29/2023 (multiple studies), 10/17/2023 FINDINGS: Images were performed intraoperatively without the presence of a radiologist. Interval revision of prior right hip arthroplasty. There appears to be revision to the acetabular cup, now with a ring around the proximal femoral head-neck junction of the prosthesis. Total fluoroscopy  images: 3 Total fluoroscopy time: 7 seconds Total dose: Radiation Exposure Index (as provided by the fluoroscopic device): 1.33 mGy air Kerma Please see intraoperative findings for further detail. IMPRESSION: Intraoperative fluoroscopy for revision of right hip arthroplasty. Electronically Signed   By: Tanda Lyons M.D.   On: 11/12/2023 17:08   DG C-Arm 1-60 Min-No Report Result Date: 11/12/2023 Fluoroscopy was utilized by the requesting physician.  No radiographic interpretation.   DG C-Arm 1-60 Min-No Report Result Date: 11/12/2023 Fluoroscopy was utilized by the requesting physician.  No radiographic interpretation.   DG Hip Unilat W or Wo Pelvis 2-3 Views Right Result Date: 10/29/2023 CLINICAL DATA:  Postreduction EXAM: DG HIP (WITH OR WITHOUT PELVIS) one-view RIGHT COMPARISON:  X-ray earlier 10/29/2023. FINDINGS: Interval postreduction of the dislocation of the right hip arthroplasty. Press-Fit components. The tip of the femoral component is not included in the imaging field on this single view. Mild joint space loss of the left hip. Preserved bone mineralization. No additional fracture or dislocation. Imaging was obtained to aid in treatment. IMPRESSION: Postreduction Electronically Signed   By: Ranell Bring M.D.   On: 10/29/2023 18:37   DG Hip Unilat W or Wo Pelvis 2-3 Views Right Result Date: 10/29/2023 CLINICAL DATA:  Hip dislocation.  Unable to straighten the leg. EXAM: DG HIP (WITH OR WITHOUT PELVIS) 2-3V RIGHT COMPARISON:  10/17/2023. FINDINGS:  Redemonstration of posterosuperior dislocation of right femoral head prosthesis in respect to the acetabular prosthesis. No acute fracture. No aggressive osseous lesion. Visualized sacral arcuate lines are unremarkable. Unremarkable symphysis pubis. There are mild degenerative changes of the left hip joint characterized by mildly reduced joint space and osteophytosis of the superior acetabulum. No radiopaque foreign bodies. IMPRESSION: *Dislocated right hip arthroplasty hardware. Electronically Signed   By: Ree Molt M.D.   On: 10/29/2023 17:25   DG Pelvis Portable Result Date: 10/17/2023 CLINICAL DATA:  Right hip prosthesis dislocation. Re-evaluate following dislocation reduction. EXAM: PORTABLE PELVIS 1-2 VIEWS COMPARISON:  AP pelvis and right hip views today at 5:23 a.m. FINDINGS: Only a single AP pelvis is provided. The right femoral head prosthesis however, appears to now be normally located in the acetabular cup. No fracture is evident. There is mild arthrosis at the left hip. Old vasectomy clips. IMPRESSION: The right femoral head prosthesis appears to now be normally located in the acetabular cup prosthesis by the AP projection. No fracture is evident. Electronically Signed   By: Francis Quam M.D.   On: 10/17/2023 06:54   CT Cervical Spine Wo Contrast Result Date: 10/17/2023 CLINICAL DATA:  67 year old male status post fall at 1830 hours yesterday. Pain. EXAM: CT CERVICAL SPINE WITHOUT CONTRAST TECHNIQUE: Multidetector CT imaging of the cervical spine was performed without intravenous contrast. Multiplanar CT image reconstructions were also generated. RADIATION DOSE REDUCTION: This exam was performed according to the departmental dose-optimization program which includes automated exposure control, adjustment of the mA and/or kV according to patient size and/or use of iterative reconstruction technique. COMPARISON:  Head CT today.  Prior cervical spine CT 10/24/2022. FINDINGS: Alignment:  Chronic straightening but mildly improved cervical lordosis from last year. Cervicothoracic junction alignment is within normal limits. Bilateral posterior element alignment is within normal limits. Skull base and vertebrae: Bone mineralization is within normal limits for age. Visualized skull base is intact. No atlanto-occipital dissociation. C1 and C2 appear chronically degenerated anteriorly, but intact and aligned. No acute osseous abnormality identified. Soft tissues and spinal canal: No prevertebral fluid or swelling. No visible canal hematoma. Calcified  cervical carotid atherosclerosis, partially retropharyngeal right carotid. Pharynx and larynx motion artifact. Otherwise negative visible noncontrast neck soft tissues. Disc levels: Chronic cervical spine degeneration. Subtle anterolisthesis of C4 on C5 is chronic with chronic left side facet arthropathy there. Disc and endplate degeneration maximal at C5-C6 and C6-C7. Mild chronic spinal stenosis suspected at the former. Upper chest: Visible upper thoracic levels appear intact. Negative lung apices. Mild Calcified aortic atherosclerosis. IMPRESSION: 1. No acute traumatic injury identified in the cervical spine. 2. Chronic cervical spine degeneration appears stable from last year. Electronically Signed   By: VEAR Hurst M.D.   On: 10/17/2023 06:03   DG Hip Unilat With Pelvis 2-3 Views Right Result Date: 10/17/2023 CLINICAL DATA:  68 year old male status post fall at 1830 hours yesterday. Pain. EXAM: DG HIP (WITH OR WITHOUT PELVIS) 2-3V RIGHT COMPARISON:  CT Abdomen and Pelvis 01/30/2022 and earlier. FINDINGS: Three views at 0526 hours including AP and cross-table lateral. Dislocated bipolar right hip arthroplasty. The femoral head is displaced posteriorly, superiorly, and slightly lateral from the acetabular cup. No periprosthetic fracture is identified. Nonobstructed bowel-gas pattern. Negative. IMPRESSION: Posterosuperior dislocated bipolar right hip  arthroplasty. No acute fracture identified. Electronically Signed   By: VEAR Hurst M.D.   On: 10/17/2023 06:00   DG Chest Port 1 View Result Date: 10/17/2023 CLINICAL DATA:  68 year old male status post fall at 1830 hours yesterday. Pain. EXAM: PORTABLE CHEST 1 VIEW COMPARISON:  Chest radiograph 05/22/2019 and earlier. FINDINGS: Portable AP semi upright views at 0520 hours. Cardiac valve prosthesis which was present on cardiac CT in September this year, new since 2020. Stable cardiac size and mediastinal contours. Chronic mild tortuosity of the thoracic aorta. Stable lung volumes. Allowing for portable technique the lungs are clear. No pneumothorax or pleural effusion identified. Visualized tracheal air column is within normal limits. Negative visible bowel gas. No acute osseous abnormality identified. IMPRESSION: 1. No acute cardiopulmonary abnormality or acute traumatic injury identified. 2. Cardiac valve replacement since 2020. Electronically Signed   By: VEAR Hurst M.D.   On: 10/17/2023 05:58   CT Head Wo Contrast Result Date: 10/17/2023 CLINICAL DATA:  68 year old male status post fall at 1830 hours yesterday. Pain. EXAM: CT HEAD WITHOUT CONTRAST TECHNIQUE: Contiguous axial images were obtained from the base of the skull through the vertex without intravenous contrast. RADIATION DOSE REDUCTION: This exam was performed according to the departmental dose-optimization program which includes automated exposure control, adjustment of the mA and/or kV according to patient size and/or use of iterative reconstruction technique. COMPARISON:  Head CT 09/21/2021. FINDINGS: Brain: Cerebral volume remains normal for age. No midline shift, ventriculomegaly, mass effect, evidence of mass lesion, intracranial hemorrhage or evidence of cortically based acute infarction. Gray-white differentiation appears stable and within normal limits for age. Vascular: No suspicious intracranial vascular hyperdensity. Skull: Stable and  intact.  No acute osseous abnormality identified. Sinuses/Orbits: Visualized paranasal sinuses and mastoids are stable and well aerated. Other: Right posterior scalp hematoma/contusion on series 4, image 40. Other orbit and scalp soft tissues appears stable. Underlying calvarium appears stable and intact. No scalp soft tissue gas. IMPRESSION: 1. Right posterior scalp hematoma/contusion.  No skull fracture. 2. Stable and negative for age noncontrast CT appearance of the brain. Electronically Signed   By: VEAR Hurst M.D.   On: 10/17/2023 05:57    Disposition: Discharge disposition: 01-Home or Self Care          Follow-up Information     Vernetta Lonni GRADE, MD Follow up in  2 week(s).   Specialty: Orthopedic Surgery Contact information: [address] [place] [address] [phone]         [facility] Follow up.   Specialty: Home Health Services Why: home health Contact information: [address] [place] [address] [phone]                  Signed: [name] 11/13/2023, 7:21 PM

## 2023-11-15 ENCOUNTER — Encounter (HOSPITAL_COMMUNITY): Payer: Self-pay | Admitting: Orthopaedic Surgery

## 2023-11-15 NOTE — Anesthesia Postprocedure Evaluation (Signed)
 Anesthesia Post Note  Patient: STEPHENSON CICHY  Procedure(s) Performed: REVISION OF RIGHT TOTAL HIP ARTHROPLASTY (Right: Hip)     Patient location during evaluation: PACU Anesthesia Type: Spinal Level of consciousness: awake, awake and alert and oriented Pain management: pain level controlled Vital Signs Assessment: post-procedure vital signs reviewed and stable Respiratory status: spontaneous breathing, nonlabored ventilation and respiratory function stable Cardiovascular status: blood pressure returned to baseline and stable Postop Assessment: no headache, no backache, spinal receding and no apparent nausea or vomiting Anesthetic complications: no   No notable events documented.  Last Vitals:  Vitals:   11/13/23 0933 11/13/23 1313  BP: (!) 123/96 110/74  Pulse: 80 65  Resp: 17 16  Temp: 37.2 C 37 C  SpO2: 97% 96%    Last Pain:  Vitals:   11/13/23 1313  TempSrc: Oral  PainSc:    Pain Goal: Patients Stated Pain Goal: 3 (11/13/23 1100)                 Garnette FORBES [place]

## 2023-11-25 ENCOUNTER — Ambulatory Visit: Payer: Medicare Other

## 2023-11-25 ENCOUNTER — Encounter: Payer: Self-pay | Admitting: Orthopaedic Surgery

## 2023-11-25 ENCOUNTER — Ambulatory Visit: Payer: Medicare Other | Admitting: Orthopaedic Surgery

## 2023-11-25 ENCOUNTER — Other Ambulatory Visit (INDEPENDENT_AMBULATORY_CARE_PROVIDER_SITE_OTHER): Payer: Medicare Other

## 2023-11-25 DIAGNOSIS — Z96649 Presence of unspecified artificial hip joint: Secondary | ICD-10-CM

## 2023-11-25 NOTE — Progress Notes (Signed)
[name] is 2 weeks status post a right total hip revision.  We performed his original hip replacement surgery back in 2012.  He had done well for a long period of time until in December of this year he had a hard mechanical fall and he dislocated the hip.  It then dislocated again a few weeks later.  At this point we recommended a surgery to place a constrained liner and a longer hip ball.  I think he did have a slight leg length difference with his left side longer than the operative right side.  He is doing well since surgery and has no issues.  Staples have been removed and Steri-Strips applied to his hip incision which looks good.  His leg lengths feel equal to me.  He tolerates me putting his right hip the range of motion.  Standing AP pelvis shows a constrained liner with a hip ball that has improved the leg length and offset.  He knows to be careful with his hip precautions and bending way over while we let the soft tissue scar down and continue to heal.  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  We will see him back in a month to see how he is doing overall but no x-rays are needed.

## 2023-12-08 ENCOUNTER — Ambulatory Visit: Payer: Medicare Other | Admitting: Orthopaedic Surgery

## 2023-12-08 ENCOUNTER — Telehealth (HOSPITAL_COMMUNITY): Payer: Self-pay

## 2023-12-08 NOTE — Telephone Encounter (Signed)
 Advanced Heart Failure Patient Advocate Encounter  The patient was approved for a [employer] grant that will help cover the cost of Entresto , Metoprolol , Spironolactone .  Total amount awarded, $10,000.  Effective: 11/08/2023 - 11/06/2024.  BIN [id number] PCN [id number] [id number] ID [id number]  Pharmacy provided with approval and processing information. Patient informed via phone.  Rachel DEL, CPhT Rx Patient Advocate Phone: [phone]

## 2023-12-29 ENCOUNTER — Telehealth (HOSPITAL_COMMUNITY): Payer: Self-pay

## 2023-12-29 ENCOUNTER — Ambulatory Visit: Payer: Medicare Other | Attending: Cardiovascular Disease | Admitting: *Deleted

## 2023-12-29 DIAGNOSIS — Z7901 Long term (current) use of anticoagulants: Secondary | ICD-10-CM

## 2023-12-29 DIAGNOSIS — I4892 Unspecified atrial flutter: Secondary | ICD-10-CM

## 2023-12-29 DIAGNOSIS — I4819 Other persistent atrial fibrillation: Secondary | ICD-10-CM | POA: Diagnosis not present

## 2023-12-29 DIAGNOSIS — Z5181 Encounter for therapeutic drug level monitoring: Secondary | ICD-10-CM | POA: Diagnosis not present

## 2023-12-29 DIAGNOSIS — I4811 Longstanding persistent atrial fibrillation: Secondary | ICD-10-CM

## 2023-12-29 LAB — POCT INR: INR: 1.7 — AB (ref 2.0–3.0)

## 2023-12-29 NOTE — Patient Instructions (Addendum)
 Description   Take 2 tablets today and then START taking warfarin 1 tablet daily except 1.5 tablets on Mondays, Wednesdays, and Fridays.   Recheck INR in 4 weeks.  [facility] [phone].

## 2023-12-29 NOTE — Telephone Encounter (Signed)
 Called to confirm/remind patient of their appointment at the [facility] on 12/30/23.   Patient reminded to bring all medications and/or complete list.  Confirmed patient has transportation. Gave directions, instructed to utilize valet parking.  Confirmed appointment prior to ending call.

## 2023-12-30 ENCOUNTER — Encounter (HOSPITAL_COMMUNITY): Payer: Self-pay

## 2023-12-30 ENCOUNTER — Ambulatory Visit (HOSPITAL_COMMUNITY)
Admission: RE | Admit: 2023-12-30 | Discharge: 2023-12-30 | Disposition: A | Discharge status: Home / Self Care | Payer: Medicare Other | Source: Ambulatory Visit | Attending: Physician Assistant | Admitting: Physician Assistant

## 2023-12-30 VITALS — BP 112/70 | HR 75 | Wt 265.2 lb

## 2023-12-30 DIAGNOSIS — I428 Other cardiomyopathies: Secondary | ICD-10-CM | POA: Insufficient documentation

## 2023-12-30 DIAGNOSIS — Z7901 Long term (current) use of anticoagulants: Secondary | ICD-10-CM | POA: Insufficient documentation

## 2023-12-30 DIAGNOSIS — Z79899 Other long term (current) drug therapy: Secondary | ICD-10-CM | POA: Insufficient documentation

## 2023-12-30 DIAGNOSIS — I4891 Unspecified atrial fibrillation: Secondary | ICD-10-CM | POA: Diagnosis not present

## 2023-12-30 DIAGNOSIS — Z9889 Other specified postprocedural states: Secondary | ICD-10-CM | POA: Diagnosis not present

## 2023-12-30 DIAGNOSIS — I484 Atypical atrial flutter: Secondary | ICD-10-CM | POA: Insufficient documentation

## 2023-12-30 DIAGNOSIS — I480 Paroxysmal atrial fibrillation: Secondary | ICD-10-CM

## 2023-12-30 DIAGNOSIS — I5022 Chronic systolic (congestive) heart failure: Secondary | ICD-10-CM | POA: Insufficient documentation

## 2023-12-30 MED ORDER — METOPROLOL SUCCINATE ER 25 MG PO TB24: 25.0000 mg | ORAL_TABLET | Freq: Every day | ORAL | 3 refills | Status: AC

## 2023-12-30 NOTE — Patient Instructions (Signed)
 Medication Changes:  START TAKING METOPROLOL SUCCINATE 25MG  ONCE DAILY   ECHOCARDIOGRAM AS SCHEDULED   Follow-Up in: 6 MONTHS WITH DR. Shirlee Latch PLEASE CALL OUR OFFICE AROUND JUNE  TO GET SCHEDULED FOR YOUR APPOINTMENT. PHONE NUMBER IS [phone] OPTION 2    At the [facility], you and your health needs are our priority. We have a designated team specialized in the treatment of Heart Failure. This Care Team includes your primary Heart Failure Specialized Cardiologist (physician), Advanced Practice Providers (APPs- Physician Assistants and Nurse Practitioners), and Pharmacist who all work together to provide you with the care you need, when you need it.   You may see any of the following providers on your designated Care Team at your next follow up:  Dr. Arvilla Meres Dr. Marca Ancona Dr. Dorthula Nettles Dr. Theresia Bough Tonye Becket, NP Robbie Lis, [place] [facility] [place], [place] Brynda Peon, NP [place] Lee, NP Karle Plumber, PharmD   Please be sure to bring in all your medications bottles to every appointment.   Need to Contact us:  If you have any questions or concerns before your next appointment please send [place] a message through [place] or call our office at [phone].    TO LEAVE A MESSAGE FOR THE NURSE SELECT OPTION 2, PLEASE LEAVE A MESSAGE INCLUDING: YOUR NAME DATE OF BIRTH CALL BACK NUMBER REASON FOR CALL**this is important as we prioritize the call backs  YOU WILL RECEIVE A CALL BACK THE SAME DAY AS LONG AS YOU CALL BEFORE 4:00 PM

## 2023-12-30 NOTE — Progress Notes (Addendum)
 PCP: Tracey Harries, MD Cardiology: Dr. Allyson Sabal HF Cardiology: Dr. Shirlee Latch   68 y.o. with history of persistent atrial fibrillation and CHF was referred by Dr. Allyson Sabal for evaluation of CHF.  Patient was noted in 2020 to be in atrial fibrillation.  Echo showed EF 35-40% with severe MR in the setting of partial flail posterior leaflet and ruptured chord.  In 5/20, he had minimally invasive mitral valve repair with Maze and LA appendage clipping.  Pre-op cath had shown no significant coronary disease.  Post-op, he remained in atrial fibrillation and had DCCV to NSR in 8/20. Echoes in 7/20 and 9/21 showed EF improved to 55-60%.  However, echo in 10/22 showed EF down to 45-50%.  The patient was noted to be in atrial fibrillation with RVR at that time. Reviewing ECGs, it looks like he has been persistently in atrial fibrillation or atypical flutter with RVR in the 110s since at least 10/22.  Most recent echo in 10/23 showed EF 25%, global hypokinesis, moderate RV dysfunction, s/p MV repair with no MR and no significant stenosis. The patient was still in atrial fibrillation.   Patient had TEE-guided DCCV in 2/24 back to NSR.  TEE showed EF 40-45%, mild LVH, global hypokinesis, normal RV, mitral valve repair with mild MR and mean gradient 2.   S/p atrial fibrillation and atrial flutter ablation by Dr. Nelly Laurence in 09/24.   Patient returns for followup of CHF and atrial fibrillation.  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 Denies dyspnea, orthopnea, PND or lower extremity edema. Feels great. Reports adherence with medications.     Social History   Socioeconomic History   Marital status: Married    Spouse name: Not on file   Number of children: 1   Years of education: Not on file   Highest education level: Not on file  Occupational History   Occupation: Teaching laboratory technician  Tobacco Use   Smoking status: Never   Smokeless tobacco: Never  Vaping Use   Vaping status: Never Used  Substance and  Sexual Activity   Alcohol use: Not Currently    Comment: 2 drinks per day of wine or white claw   Drug use: No   Sexual activity: Yes  Other Topics Concern   Not on file  Social History Narrative   Not on file   Social Drivers of Health   Financial Resource Strain: Low Risk  (10/14/2023)   Received from [facility]   Overall Financial Resource Strain (CARDIA)    Difficulty of Paying Living Expenses: Not hard at all  Food Insecurity: No Food Insecurity (11/12/2023)   Hunger Vital Sign    Worried About Running Out of Food in the Last Year: Never true    Ran Out of Food in the Last Year: Never true  Transportation Needs: No Transportation Needs (11/12/2023)   PRAPARE - Administrator, Civil Service (Medical): No    Lack of Transportation (Non-Medical): No  Physical Activity: Inactive (10/14/2023)   Received from [facility]   Exercise Vital Sign    Days of Exercise per Week: 0 days    Minutes of Exercise per Session: 0 min  Stress: Stress Concern Present (10/14/2023)   Received from [facility] - Occupational Stress Questionnaire    Feeling of Stress : To some extent  Social Connections: Moderately Integrated (11/13/2023)   Social Connection and Isolation Panel [NHANES]    Frequency of Communication with Friends  and Family: More than three times a week    Frequency of Social Gatherings with Friends and Family: More than three times a week    Attends Religious Services: Never    Database administrator or Organizations: Yes    Attends Banker Meetings: Never    Marital Status: Married  Catering manager Violence: Not At Risk (11/12/2023)   Humiliation, Afraid, Rape, and Kick questionnaire    Fear of Current or Ex-Partner: No    Emotionally Abused: No    Physically Abused: No    Sexually Abused: No   Family History  Problem Relation Age of Onset   Hypertension Father    Colon cancer Father        dx in  his late 51's   Diabetes Mother    Stomach cancer Neg Hx    Esophageal cancer Neg Hx    Pancreatic cancer Neg Hx    Rectal cancer Neg Hx    ROS: All systems reviewed and negative except as per HPI.  Current Outpatient Medications  Medication Sig Dispense Refill   ALPRAZolam (XANAX) 0.5 MG tablet Take 0.5 mg by mouth 2 (two) times daily.     atorvastatin (LIPITOR) 20 MG tablet Take 20 mg by mouth every morning.     bismuth subsalicylate (PEPTO BISMOL) 262 MG/15ML suspension Take 30 mLs by mouth every 6 (six) hours as needed for indigestion or diarrhea or loose stools.     Docusate Sodium (DSS) 100 MG CAPS Take 300 mg by mouth every morning.     meclizine (ANTIVERT) 25 MG tablet Take 50 mg by mouth daily as needed for dizziness.     Melatonin 10 MG TABS Take 10 mg by mouth at bedtime.     metoprolol succinate (TOPROL-XL) 25 MG 24 hr tablet Take 1 tablet (25 mg total) by mouth daily. Take with or immediately following a meal. 90 tablet 3   Multiple Vitamin (MULTIVITAMIN WITH MINERALS) TABS tablet Take 1 tablet by mouth every morning.     oxyCODONE-acetaminophen (PERCOCET) 10-325 MG tablet Take 1-1.5 tablets by mouth in the morning, at noon, in the evening, and at bedtime.     sacubitril-valsartan (ENTRESTO) 49-51 MG Take 1 tablet by mouth 2 (two) times daily. 60 tablet 11   silver sulfADIAZINE (SILVADENE) 1 % cream Apply 1 Application topically daily. Apply to affected area daily plus dry dressing (Patient taking differently: Apply 1 Application topically daily. Apply to affected area daily plus dry dressing every other day) 400 g 3   spironolactone (ALDACTONE) 25 MG tablet Take 1 tablet (25 mg total) by mouth daily. 30 tablet 6   torsemide (DEMADEX) 20 MG tablet TAKE 1 TABLET EVERY DAY 90 tablet 3   warfarin (COUMADIN) 5 MG tablet TAKE 1 TABLET EVERY DAY EXCEPT TAKE 1 AND 1/2 TABLETS ON WEDNESDAY AS INSTRUCTED (Patient taking differently: Take 5-7.5 mg by mouth See admin instructions. Take 5 mg  by mouth daily, except Monday Wednesday and Friday take 7.5 mg) 100 tablet 0   No current facility-administered medications for this encounter.   BP 112/70   Pulse 75   Wt 120.3 kg (265 lb 3.2 oz)   SpO2 94%   BMI 33.15 kg/m  General:  Well appearing. Neck: supple. no JVD.  Cor: Regular rate & rhythm. No rubs, gallops or murmurs. Lungs: clear Abdomen: soft, nontender, nondistended.  Extremities: no edema Neuro: alert & orientedx3. Affect pleasant  ECG today personally reviewed: NSR 786 bpm, RBBB with  QRS 168 ms   Assessment/Plan: 1. Chronic systolic CHF:  Nonischemic cardiomyopathy. Echoes in 7/20 and 9/21, presumably in NSR, showed EF 55-60%.  Echo in 10/22 EF down to 45-50%, he was in atrial fibrillation at that time.  Echo in 10/23, also in AF or AFL, showed EF 25%, moderate RV dysfunction, s/p MV repair with no MR and no significant stenosis. Patient had a cath with no significant coronary disease in 5/20.  His mitral valve repair was intact on 2/24 TEE.  Suspect that the fall in EF on 10/23 echo may have been due to persistent AF/AFL with RVR (tachycardia-mediated cardiomyopathy); it appears his HR had been > 100 chronically.  EF 40-45% on TEE in 02/24. He is now back in NSR post atrial fibrillation and atrial flutter ablations.  - NYHA I, not volume overloaded. Continue torsemide 20 mg daily.  - Need to keep him in NSR.  - Continue Entresto 49/51 mg BID - He has been taking metoprolol tartrate instead of succinate for unknown duration, switch back to metoprolol succinate 25 mg daily. Sent Rx to pharmacy. - Continue spiro 25 mg daily - Consider adding SGLT2i if EF remains reduced, has not been interested in adding additional medications - Renal function and potassium were stable on BMET last month - Repeat echo to assess LV function now that he is maintaining SR. If tachy-mediated CMP, should improve if he stays in NSR.   2. Atrial fibrillation/atypical AFL: H/o Maze and LA  appendage ligation with MV repair in 2020.  Patient has had recurrent atrial arrhythmias.  Patient has been noted to have both AF and atypical AFL. HR was chronically > 100, concern for tachycardia-mediated cardiomyopathy.  - He is s/p atrial flutter and atrial fibrillation ablations in 09/24. He is off amiodarone and maintaining SR.  - Continue Warfarin  3. H/o MV repair: Mitral valve repair appeared stable on 10/23 echo with no significant MR or MS. TEE 2/24 with mild MR, no significant MS.  - Needs antibiotic prophylaxis with dental work.  - Repeat echo as above  Followup 6 months, consider graduation from HF clinic prior to the visit if EF has recovered on repeat echo (will review results with Dr. Shirlee Latch). He continues to follow with his primary Cardiologist, Dr. Allyson Sabal.  Paco Cislo N 12/30/2023

## 2024-01-03 ENCOUNTER — Other Ambulatory Visit: Payer: Self-pay | Admitting: Cardiovascular Disease

## 2024-01-03 DIAGNOSIS — I4819 Other persistent atrial fibrillation: Secondary | ICD-10-CM

## 2024-01-04 ENCOUNTER — Encounter: Payer: Self-pay | Admitting: Cardiovascular Disease

## 2024-01-07 ENCOUNTER — Ambulatory Visit: Payer: Medicare HMO | Attending: Cardiovascular Disease | Admitting: Cardiovascular Disease

## 2024-01-07 ENCOUNTER — Encounter: Payer: Self-pay | Admitting: Cardiovascular Disease

## 2024-01-07 VITALS — BP 128/83 | HR 66 | Ht 75.0 in | Wt 263.0 lb

## 2024-01-07 DIAGNOSIS — E782 Mixed hyperlipidemia: Secondary | ICD-10-CM | POA: Diagnosis not present

## 2024-01-07 DIAGNOSIS — I340 Nonrheumatic mitral (valve) insufficiency: Secondary | ICD-10-CM

## 2024-01-07 DIAGNOSIS — I4892 Unspecified atrial flutter: Secondary | ICD-10-CM | POA: Diagnosis not present

## 2024-01-07 DIAGNOSIS — I10 Essential (primary) hypertension: Secondary | ICD-10-CM

## 2024-01-07 NOTE — Assessment & Plan Note (Addendum)
 History of severe mitral regurgitation status post minimally invasive mitral valve repair by Dr. Cornelius Moras along with left atrial surgical maze procedure and left atrial appendage clipping.  His follow-up EF was fairly low in the 25% range.  It is slowly come up with GDMT to the 45% range.  He is asymptomatic.

## 2024-01-07 NOTE — Progress Notes (Signed)
 01/07/2024 [name]   Oct 26, 1956  [id number]  Primary Physician Tracey Harries, MD Primary Cardiologist: Runell Gess MD Nicholes Calam[place], [place]  HPI:  [name] is a 68 y.o.   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 He was referred by Dr. Everlene Other for cardiovascular evaluation because of hypertension and mitral regurgitation.  I last saw him in the office 04/23/2023.  His only cardiac risk factor is treated hypertension. He does not smoke. He's never had a heart attack or stroke. He is on 3 antihypertensive medications and is aware of some restriction. His last 2-D echocardiogram performed 11/25/12 revealed mildly dilated left ventricle with normal systolic function, moderate to severe MR and severe left atrial enlargement. He remains in sinus rhythm and otherwise is asymptomatic.     When I saw him in the office last he was in A. fib with RVR   He does complain of some increasing shortness of breath as well.  He appeared to be volume overloaded, and and heart failure.  I arrange for him to be admitted to the hospital where he was diuresed and underwent TEE guided DC cardioversion back to sinus rhythm.  He is on Eliquis.  He was seen by Dr. Cornelius Moras who agreed that he needed mitral valve repair.  His EF was 35 to 40% with severe MR and a flail P2 segment of the posterior leaflet with a ruptured chordae.  He is lost from 287 down to 241 pounds.  He is in sinus rhythm clinically and is improved.   He ultimately underwent cardiac catheterization by Dr. Excell Seltzer 03/16/2019 revealing normal coronary arteries.  5 days later on 03/21/2019 he underwent minimally invasive mitral valve repair along with a surgical Maze procedure and left atrial clipping.  He was discharged home on 03/28/2019 on Coumadin anticoagulation.  He was seen by Corine Shelter in the office 05/29/2019 and was found to be in atrial fibrillation.  He was on  amiodarone.  He underwent outpatient cardioversion successfully on 06/22/2019 by Dr. Dietrich Pates.  A 2D echo performed 05/26/2019 revealed improvement in his EF to normal with no evidence of mitral regurgitation.   Since I saw him in the office 9 months ago he did undergo A-fib ablation by Dr. Nelly Laurence 07/15/2023.  He remains in sinus rhythm on Coumadin anticoagulation.  He has also been seeing the [facility] heart failure clinic and is on GDMT.  He has a 2D echo pending.  He recently had his hip revised by Dr. Magnus Ivan..   No outpatient medications have been marked as taking for the 01/07/24 encounter (Office Visit) with Runell Gess, MD.     No Known Allergies  Social History   Socioeconomic History   Marital status: Married    Spouse name: Not on file   Number of children: 1   Years of education: Not on file   Highest education level: Not on file  Occupational History   Occupation: Teaching laboratory technician  Tobacco Use   Smoking status: Never   Smokeless tobacco: Never  Vaping Use   Vaping status: Never Used  Substance and Sexual Activity   Alcohol use: Not Currently    Comment: 2 drinks per day of wine or white claw   Drug use: No   Sexual activity: Yes  Other Topics Concern   Not on file  Social History Narrative   Not on file   Social  Drivers of Health   Financial Resource Strain: Low Risk  (10/14/2023)   Received from [facility]   Overall Financial Resource Strain (CARDIA)    Difficulty of Paying Living Expenses: Not hard at all  Food Insecurity: No Food Insecurity (11/12/2023)   Hunger Vital Sign    Worried About Running Out of Food in the Last Year: Never true    Ran Out of Food in the Last Year: Never true  Transportation Needs: No Transportation Needs (11/12/2023)   PRAPARE - Administrator, Civil Service (Medical): No    Lack of Transportation (Non-Medical): No  Physical Activity: Inactive (10/14/2023)   Received from [facility]   Exercise Vital Sign    Days  of Exercise per Week: 0 days    Minutes of Exercise per Session: 0 min  Stress: Stress Concern Present (10/14/2023)   Received from [facility] of Occupational Health - Occupational Stress Questionnaire    Feeling of Stress : To some extent  Social Connections: Moderately Integrated (11/13/2023)   Social Connection and Isolation Panel [NHANES]    Frequency of Communication with Friends and Family: More than three times a week    Frequency of Social Gatherings with Friends and Family: More than three times a week    Attends Religious Services: Never    Database administrator or Organizations: Yes    Attends Banker Meetings: Never    Marital Status: Married  Catering manager Violence: Not At Risk (11/12/2023)   Humiliation, Afraid, Rape, and Kick questionnaire    Fear of Current or Ex-Partner: No    Emotionally Abused: No    Physically Abused: No    Sexually Abused: No     Review of Systems: General: negative for chills, fever, night sweats or weight changes.  Cardiovascular: negative for chest pain, dyspnea on exertion, edema, orthopnea, palpitations, paroxysmal nocturnal dyspnea or shortness of breath Dermatological: negative for rash Respiratory: negative for cough or wheezing Urologic: negative for hematuria Abdominal: negative for nausea, vomiting, diarrhea, bright red blood per rectum, melena, or hematemesis Neurologic: negative for visual changes, syncope, or dizziness All other systems reviewed and are otherwise negative except as noted above.    Blood pressure 128/83, pulse 66, height 6\' 3"  (1.905 m), weight 263 lb (119.3 kg), SpO2 94%.  General appearance: alert and no distress Neck: no adenopathy, no carotid bruit, no JVD, supple, symmetrical, trachea midline, and thyroid not enlarged, symmetric, no tenderness/mass/nodules Lungs: clear to auscultation bilaterally Heart: regular rate and rhythm, S1, S2 normal, no murmur, click, rub or  gallop Extremities: extremities normal, atraumatic, no cyanosis or edema Pulses: 2+ and symmetric Skin: Skin color, texture, turgor normal. No rashes or lesions Neurologic: Grossly normal  EKG not performed today      ASSESSMENT AND PLAN:   Essential hypertension History of essential hypertension blood pressure measured today at 128/83.  He is on metoprolol and Entresto.  Severe mitral regurgitation History of severe mitral regurgitation status post minimally invasive mitral valve repair by Dr. Cornelius Moras along with left atrial surgical maze procedure and left atrial appendage clipping.  His follow-up EF was fairly low in the 25% range.  It is slowly come up with GDMT to the 45% range.  He is asymptomatic.  Atrial flutter with rapid ventricular response (HCC) History of atrial flutter status post ablation by Dr. Nelly Laurence 07/14/2013.  He remains on Coumadin but no longer takes amiodarone.  His last EKG revealed sinus rhythm.  Hyperlipidemia History of hyperlipidemia on statin therapy followed by his PCP.     Runell Gess MD FACP,FACC,FAHA, [facility] 01/07/2024 4:17 PM

## 2024-01-07 NOTE — Assessment & Plan Note (Signed)
History of hyperlipidemia on statin therapy followed by his PCP 

## 2024-01-07 NOTE — Patient Instructions (Signed)
 Medication Instructions:  Your physician recommends that you continue on your current medications as directed. Please refer to the Current Medication list given to you today.  *If you need a refill on your cardiac medications before your next appointment, please call your pharmacy*   Follow-Up: At [facility], you and your health needs are our priority.  As part of our continuing mission to provide you with exceptional heart care, we have created designated Provider Care Teams.  These Care Teams include your primary Cardiologist (physician) and Advanced Practice Providers (APPs -  Physician Assistants and Nurse Practitioners) who all work together to provide you with the care you need, when you need it.  We recommend signing up for the patient portal called "MyChart".  Sign up information is provided on this After Visit Summary.  MyChart is used to connect with patients for Virtual Visits (Telemedicine).  Patients are able to view lab/test results, encounter notes, upcoming appointments, etc.  Non-urgent messages can be sent to your provider as well.   To learn more about what you can do with MyChart, go to [email or web address].    Your next appointment:   12 month(s)  Provider:   Nanetta Batty, MD     Other Instructions   1st Floor: - Lobby - Registration  - Pharmacy  - Lab - Cafe  2nd Floor: - PV Lab - Diagnostic Testing (echo, CT, nuclear med)  3rd Floor: - Vacant  4th Floor: - TCTS (cardiothoracic surgery) - AFib Clinic - Structural Heart Clinic - Vascular Surgery  - Vascular Ultrasound  5th Floor: - HeartCare Cardiology (general and EP) - Clinical Pharmacy for coumadin, hypertension, lipid, weight-loss medications, and med management appointments    Valet parking services will be available as well.

## 2024-01-07 NOTE — Assessment & Plan Note (Signed)
 History of atrial flutter status post ablation by Dr. Nelly Laurence 07/14/2013.  He remains on Coumadin but no longer takes amiodarone.  His last EKG revealed sinus rhythm.

## 2024-01-07 NOTE — Assessment & Plan Note (Signed)
 History of essential hypertension blood pressure measured today at 128/83.  He is on metoprolol and Entresto.

## 2024-01-11 MED ORDER — WARFARIN SODIUM 5 MG PO TABS: ORAL_TABLET | ORAL | 0 refills | Status: DC

## 2024-01-17 ENCOUNTER — Ambulatory Visit: Admitting: Orthopedic Surgery

## 2024-01-17 DIAGNOSIS — S81802D Unspecified open wound, left lower leg, subsequent encounter: Secondary | ICD-10-CM | POA: Diagnosis not present

## 2024-01-18 ENCOUNTER — Encounter: Payer: Self-pay | Admitting: Orthopedic Surgery

## 2024-01-18 NOTE — Progress Notes (Signed)
 Office Visit Note   Patient: [name]           Date of Birth: [birth year]           MRN: [id number] Visit Date: 01/17/2024              Requested by: Tracey Harries, MD [address] [place],  [place] [address] PCP: Tracey Harries, MD  Chief Complaint  Patient presents with   Left Leg - Wound Check      HPI: Patient is a 68 year old gentleman is seen in follow-up for left lower extremity wound.  Patient continues to have slow steady healing he feels like the wound healing has stalled.  Assessment & Plan: Visit Diagnoses:  1. Leg wound, left, subsequent encounter     Plan: Donated Kerecis micro was applied to the wound.  Follow-Up Instructions: Return in about 4 weeks (around 02/14/2024).   Ortho Exam  Patient is alert, oriented, no adenopathy, well-dressed, normal affect, normal respiratory effort. Examination the wound bed is flat with healthy granulation tissue lateral aspect of the left leg.  Fibrinous exudative tissue was debrided.  The wound measures 3 x 4 cm in its largest dimensions.  Donated Kerecis micro graft was applied.  Imaging: No results found.   Labs: Lab Results  Component Value Date   HGBA1C 5.4 01/29/2022   HGBA1C 5.2 03/16/2019   REPTSTATUS 01/07/2022 FINAL 01/02/2022   REPTSTATUS 01/08/2022 FINAL 01/02/2022   GRAMSTAIN  01/02/2022    FEW WBC PRESENT,BOTH PMN AND MONONUCLEAR RARE GRAM POSITIVE COCCI RARE GRAM NEGATIVE RODS Performed at [facility], [address] [place], [place] [address]    GRAMSTAIN  01/02/2022    FEW WBC PRESENT,BOTH PMN AND MONONUCLEAR NO ORGANISMS SEEN    CULT  01/02/2022    FEW STREPTOCOCCUS GROUP C Beta hemolytic streptococci are predictably susceptible to penicillin and other beta lactams. Susceptibility testing not routinely performed. ABUNDANT KLEBSIELLA OXYTOCA ABUNDANT STENOTROPHOMONAS MALTOPHILIA    CULT  01/02/2022    NO ANAEROBES ISOLATED Performed at [facility]  Lab, [address] [place], [place] [address]    LABORGA KLEBSIELLA OXYTOCA 01/02/2022   LABORGA STENOTROPHOMONAS MALTOPHILIA 01/02/2022     Lab Results  Component Value Date   ALBUMIN 4.0 01/01/2023   ALBUMIN 4.0 11/20/2022   ALBUMIN 2.5 (L) 01/30/2022    Lab Results  Component Value Date   MG 2.1 01/29/2022   MG 2.1 06/19/2019   MG 2.2 03/25/2019   No results found for: "VD25OH"  No results found for: "PREALBUMIN"    Latest Ref Rng & Units 11/13/2023    4:28 AM 11/05/2023   11:27 AM 10/29/2023    5:20 PM  CBC EXTENDED  WBC 4.0 - 10.5 K/uL 10.7  7.1  9.7   RBC 4.22 - 5.81 MIL/uL 4.03  4.51  4.27   Hemoglobin 13.0 - 17.0 g/dL 36.6  44.0  34.7   HCT 39.0 - 52.0 % 39.2  44.7  42.0   Platelets 150 - 400 K/uL 175  271  218      There is no height or weight on file to calculate BMI.  Orders:  No orders of the defined types were placed in this encounter.  No orders of the defined types were placed in this encounter.    Procedures: No procedures performed  Clinical Data: No additional findings.  ROS:  All other systems negative, except as noted in the HPI. Review of Systems  Objective: Vital Signs: There were no vitals taken for this visit.  Specialty Comments:  No specialty comments available.  PMFS History: Patient Active Problem List   Diagnosis Date Noted   Status post revision of total hip 11/12/2023   Recurrent dislocation of right hip joint prosthesis (HCC) 11/11/2023   Left ventricular dysfunction 10/13/2022   Rectal bleeding 01/30/2022   Abnormal finding on imaging 01/30/2022   GIB (gastrointestinal bleeding) 01/29/2022   Hyponatremia 01/29/2022   Hypocalcemia 01/29/2022   Alcohol abuse 01/29/2022   AKI (acute kidney injury) (HCC) 01/29/2022   Symptomatic anemia 01/29/2022   Venous stasis dermatitis of both lower extremities    Bleeding on Coumadin    Leg wound, left, subsequent encounter 12/30/2021   Compartment syndrome of left lower  extremity (HCC) 09/22/2021   Status post surgery 09/22/2021   Compartment syndrome of left upper extremity (HCC) 09/22/2021   Hyperlipidemia 12/13/2020   Atrial fibrillation (HCC) 06/10/2020   Long term (current) use of anticoagulants 06/10/2020   Secondary hypercoagulable state (HCC) 04/24/2020   AV junctional bradycardia 03/22/2019   S/P minimally invasive mitral valve repair  03/21/2019   S/P Maze operation for atrial fibrillation 03/21/2019   Dilated aortic root (HCC) 01/04/2019   Dilated cardiomyopathy (HCC) 01/04/2019   Atrial flutter with rapid ventricular response (HCC)    Mitral valve prolapse    Hypertensive urgency 12/30/2018   Acute on chronic systolic (congestive) heart failure (HCC) 12/30/2018   Acute systolic heart failure (HCC) 12/30/2018   Chronic systolic (congestive) heart failure (HCC)    Persistent atrial fibrillation (HCC) 12/02/2018   Right hamstring muscle strain 07/20/2018   Essential hypertension 02/07/2016   Severe mitral regurgitation    Annual physical exam 11/16/2014   Arthritis of left knee 10/23/2014   Status post total left knee replacement 10/23/2014   Arthritis of knee, right 01/19/2014   Status post total knee replacement 01/19/2014   Benign paroxysmal positional vertigo 10/30/2013   Testicular hypofunction 03/24/2012   Past Medical History:  Diagnosis Date   Acute on chronic systolic (congestive) heart failure (HCC) 12/30/2018   Allergy    Anxiety    Arthritis    Atrial flutter with rapid ventricular response (HCC)    Chronic systolic (congestive) heart failure (HCC)    COVID 10/2021   mild   Dilated aortic root (HCC) 01/04/2019   Dilated cardiomyopathy (HCC) 01/04/2019   Dysrhythmia    Heart murmur    History of blood transfusion    01/27/22   History of colon polyps    Hypertension    Insomnia    Mitral valve prolapse    Mitral valve regurgitation    Persistent atrial fibrillation (HCC) 12/02/2018   S/P Maze operation for  atrial fibrillation 03/21/2019   Complete bilateral atrial lesion set using cryothermy and bipolar radiofrequency ablation with clipping of LA appendage via right mini thoracotomy approach   S/P minimally invasive mitral valve repair 03/21/2019   Complex valvuloplasty including triangular resection of posterior leaflet, artificial Gore-tex neochord placement x4 and 36 mm Sorin Memo 4D ring annuloplasty via right mini thoracotomy approach   Seizures (HCC)    had one approx. 30 yrs ago,has not had any since; due to alchol and no sleep   Severe mitral regurgitation     Family History  Problem Relation Age of Onset   Hypertension Father    Colon cancer Father        dx in his late 52's   Diabetes Mother  Stomach cancer Neg Hx    Esophageal cancer Neg Hx    Pancreatic cancer Neg Hx    Rectal cancer Neg Hx     Past Surgical History:  Procedure Laterality Date   ANKLE FUSION  left   Dec. 2012   ANTERIOR HIP REVISION Right 11/12/2023   Procedure: REVISION OF RIGHT TOTAL HIP ARTHROPLASTY;  Surgeon: Kathryne Hitch, MD;  Location: [facility];  Service: Orthopedics;  Laterality: Right;   APPLICATION OF WOUND VAC Left 09/22/2021   Procedure: APPLICATION OF WOUND VAC;  Surgeon: Kathryne Hitch, MD;  Location: [facility] OR;  Service: Orthopedics;  Laterality: Left;   APPLICATION OF WOUND VAC Left 12/30/2021   Procedure: APPLICATION OF WOUND VAC;  Surgeon: Kathryne Hitch, MD;  Location: [facility] OR;  Service: Orthopedics;  Laterality: Left;   APPLICATION OF WOUND VAC Left 04/10/2022   Procedure: APPLICATION OF WOUND VAC;  Surgeon: Nadara Mustard, MD;  Location: [facility] OR;  Service: Orthopedics;  Laterality: Left;   ARTHROSCOPY KNEE W/ DRILLING  bilateral   2012   ATRIAL FIBRILLATION ABLATION N/A 07/15/2023   Procedure: ATRIAL FIBRILLATION ABLATION;  Surgeon: Maurice Small, MD;  Location: [facility];  Service: Cardiovascular;  Laterality: N/A;   BIOPSY  01/30/2022   Procedure:  BIOPSY;  Surgeon: Meridee Score Netty Starring., MD;  Location: [facility];  Service: Gastroenterology;;   CARDIAC VALVE REPLACEMENT     CARDIOVERSION N/A 01/03/2019   Procedure: CARDIOVERSION;  Surgeon: Quintella Reichert, MD;  Location: [facility]COPY;  Service: Cardiovascular;  Laterality: N/A;   CARDIOVERSION N/A 06/23/2019   Procedure: CARDIOVERSION;  Surgeon: Pricilla Riffle, MD;  Location: [facility] ENDOSCOPY;  Service: Cardiovascular;  Laterality: N/A;   CARDIOVERSION N/A 12/11/2022   Procedure: CARDIOVERSION;  Surgeon: Laurey Morale, MD;  Location: [facility] ENDOSCOPY;  Service: Cardiovascular;  Laterality: N/A;   CLIPPING OF ATRIAL APPENDAGE  03/21/2019   Procedure: Clipping Of Atrial Appendage using 45mm Atricure Pro2 Clip;  Surgeon: Purcell Nails, MD;  Location: [facility] OR;  Service: Open Heart Surgery;;   COLONOSCOPY     x2   COLONOSCOPY N/A 01/30/2022   Procedure: COLONOSCOPY;  Surgeon: Lemar Lofty., MD;  Location: [facility];  Service: Gastroenterology;  Laterality: N/A;   ESOPHAGOGASTRODUODENOSCOPY N/A 01/30/2022   Procedure: ESOPHAGOGASTRODUODENOSCOPY (EGD);  Surgeon: Lemar Lofty., MD;  Location: [facility];  Service: Gastroenterology;  Laterality: N/A;   FOOT ARTHRODESIS  06/23/2012   Procedure: ARTHRODESIS FOOT;  Surgeon: Toni Arthurs, MD;  Location: [facility] OR;  Service: Orthopedics;  Laterality: Left;  Left Subtalar and Talonavicular Joint Revision Arthrodesis  Aspiration of Bone Marrow from Left Hip    HAIR TRANSPLANT     HARDWARE REMOVAL  06/23/2012   Procedure: HARDWARE REMOVAL;  Surgeon: Toni Arthurs, MD;  Location: [facility] OR;  Service: Orthopedics;  Laterality: Left;  Removal of Deep Implant  X's 3   I & D EXTREMITY Left 09/23/2021   Procedure: IRRIGATION AND DEBRIDEMENT OF LEG;  Surgeon: Kathryne Hitch, MD;  [facility];  Service: Orthopedics;  Laterality: Left;   I & D EXTREMITY Left 09/26/2021   Procedure: REPEAT IRRIGATION AND DEBRIDEMENT LEFT LEG, POSSIBLE WOUND  CLOSURE, POSSIBLE VAC CHANGE, POSSIBLE SKIN GRAFT;  Surgeon: Kathryne Hitch, MD;  Location: [facility];  Service: Orthopedics;  Laterality: Left;   I & D EXTREMITY Left 12/30/2021   Procedure: IRRIGATION AND DEBRIDEMENT LEFT LOWER LEG WOUND;  Surgeon: Kathryne Hitch, MD;  Location: [facility] OR;  Service:  Orthopedics;  Laterality: Left;   I & D EXTREMITY Left 01/02/2022   Procedure: LEFT LEG DEBRIDEMENT AND TISSUE GRAFT;  Surgeon: Nadara Mustard, MD;  Location: [facility] OR;  Service: Orthopedics;  Laterality: Left;   INCISION AND DRAINAGE WOUND WITH FASCIOTOMY Left 09/22/2021   Procedure: INCISION AND DRAINAGE WOUND WITH FASCIOTOMY;  Surgeon: Kathryne Hitch, MD;  [facility];  Service: Orthopedics;  Laterality: Left;   INSERTION OF MESH N/A 07/10/2016   Procedure: INSERTION OF MESH;  Surgeon: Abigail Miyamoto, MD;  Location: [facility];  Service: General;  Laterality: N/A;   JOINT REPLACEMENT     right hip  01-2011   LEFT HEART CATH AND CORONARY ANGIOGRAPHY N/A 03/16/2019   Procedure: LEFT HEART CATH AND CORONARY ANGIOGRAPHY;  Surgeon: Tonny Bollman, MD;  Location: [facility] INVASIVE CV LAB;  Service: Cardiovascular;  Laterality: N/A;   LIMB SPARING RESECTION HIP W/ SADDLE JOINT REPLACEMENT Right    MINIMALLY INVASIVE MAZE PROCEDURE N/A 03/21/2019   Procedure: MINIMALLY INVASIVE MAZE PROCEDURE;  Surgeon: Purcell Nails, MD;  Location: [facility] OR;  Service: Open Heart Surgery;  Laterality: N/A;   MITRAL VALVE REPAIR Right 03/21/2019   Procedure: MINIMALLY INVASIVE MITRAL VALVE REPAIR (MVR) using Memo 4D ring size 36;  Surgeon: Purcell Nails, MD;  Location: [facility];  Service: Open Heart Surgery;  Laterality: Right;   POLYPECTOMY  01/30/2022   Procedure: POLYPECTOMY;  Surgeon: Mansouraty, Netty Starring., MD;  Location: [facility];  Service: Gastroenterology;;   SKIN SPLIT GRAFT Left 04/10/2022   Procedure: APPLY SKIN GRAFT TO LEFT LEG WOUND;  Surgeon: Nadara Mustard, MD;  Location: [facility] OR;   Service: Orthopedics;  Laterality: Left;   TEE WITHOUT CARDIOVERSION N/A 01/03/2019   Procedure: TRANSESOPHAGEAL ECHOCARDIOGRAM (TEE);  Surgeon: Quintella Reichert, MD;  Location: [facility] ENDOSCOPY;  Service: Cardiovascular;  Laterality: N/A;   TEE WITHOUT CARDIOVERSION N/A 03/21/2019   Procedure: TRANSESOPHAGEAL ECHOCARDIOGRAM (TEE);  Surgeon: Purcell Nails, MD;  Location: [facility] OR;  Service: Open Heart Surgery;  Laterality: N/A;   TEE WITHOUT CARDIOVERSION N/A 12/11/2022   Procedure: TRANSESOPHAGEAL ECHOCARDIOGRAM (TEE);  Surgeon: Laurey Morale, MD;  Location: [facility] ENDOSCOPY;  Service: Cardiovascular;  Laterality: N/A;   TEMPORARY PACEMAKER N/A 03/22/2019   Procedure: TEMPORARY PACEMAKER;  Surgeon: Lyn Records, MD;  Location: [facility] INVASIVE CV LAB;  Service: Cardiovascular;  Laterality: N/A;   TOTAL KNEE ARTHROPLASTY Right 01/19/2014   Procedure: RIGHT TOTAL KNEE ARTHROPLASTY, Steroid injection left knee;  Surgeon: Kathryne Hitch, MD;  Location: [facility];  Service: Orthopedics;  Laterality: Right;   TOTAL KNEE ARTHROPLASTY Left 10/23/2014   Procedure: LEFT TOTAL KNEE ARTHROPLASTY;  Surgeon: Kathryne Hitch, MD;  Location: [facility];  Service: Orthopedics;  Laterality: Left;   UMBILICAL HERNIA REPAIR N/A 07/10/2016   Procedure: UMBILICAL HERNIA REPAIR;  Surgeon: Abigail Miyamoto, MD;  Location: [facility];  Service: General;  Laterality: N/A;   Social History   Occupational History   Occupation: Teaching laboratory technician  Tobacco Use   Smoking status: Never   Smokeless tobacco: Never  Vaping Use   Vaping status: Never Used  Substance and Sexual Activity   Alcohol use: Not Currently    Comment: 2 drinks per day of wine or white claw   Drug use: No   Sexual activity: Yes

## 2024-01-20 ENCOUNTER — Ambulatory Visit: Payer: Medicare Other | Admitting: Orthopaedic Surgery

## 2024-01-20 DIAGNOSIS — Z96649 Presence of unspecified artificial hip joint: Secondary | ICD-10-CM

## 2024-01-20 NOTE — Progress Notes (Signed)
 [name] comes in today at about 10 weeks status post revision of the right hip secondary to a dislocation.  His original hip was replaced back in 2012 and he done well up until this past winter when he had a hard mechanical fall and his hip dislocated.  He then had another dislocation so we took him to the operating room and we lengthened him with a longer femoral head and constrained liner.  He has had no more incidences of the hip coming out of place.  He feels like his balance is good and he has no issues she states.  On exam his right hip moves smoothly and fluidly.  He is reporting good range of motion and strength as well.  The hip feels stable.  From my standpoint he will continue to increase his activities as comfort allows.  I would like to see him back in 3 months with just a standing AP pelvis at that visit.

## 2024-01-26 ENCOUNTER — Ambulatory Visit: Payer: Medicare Other | Attending: Cardiovascular Disease | Admitting: *Deleted

## 2024-01-26 DIAGNOSIS — Z7901 Long term (current) use of anticoagulants: Secondary | ICD-10-CM

## 2024-01-26 DIAGNOSIS — I4819 Other persistent atrial fibrillation: Secondary | ICD-10-CM | POA: Diagnosis not present

## 2024-01-26 DIAGNOSIS — Z5181 Encounter for therapeutic drug level monitoring: Secondary | ICD-10-CM

## 2024-01-26 DIAGNOSIS — I4892 Unspecified atrial flutter: Secondary | ICD-10-CM

## 2024-01-26 DIAGNOSIS — I4811 Longstanding persistent atrial fibrillation: Secondary | ICD-10-CM

## 2024-01-26 LAB — POCT INR: INR: 1.8 — AB (ref 2.0–3.0)

## 2024-01-26 NOTE — Patient Instructions (Addendum)
 Description   Take 2 tablets today and then START taking warfarin 1.5 tablets daily except 1 tablet on Tuesday, Thursday, and Saturday.   Recheck INR in 4 weeks.  Cou[facility]in [facility] [phone].

## 2024-01-28 ENCOUNTER — Ambulatory Visit (HOSPITAL_COMMUNITY)
Admission: RE | Admit: 2024-01-28 | Discharge: 2024-01-28 | Disposition: A | Discharge status: Home / Self Care | Payer: Medicare Other | Source: Ambulatory Visit | Attending: Physician Assistant | Admitting: Physician Assistant

## 2024-01-28 DIAGNOSIS — I358 Other nonrheumatic aortic valve disorders: Secondary | ICD-10-CM | POA: Diagnosis not present

## 2024-01-28 DIAGNOSIS — I5022 Chronic systolic (congestive) heart failure: Secondary | ICD-10-CM | POA: Diagnosis present

## 2024-01-28 DIAGNOSIS — I110 Hypertensive heart disease with heart failure: Secondary | ICD-10-CM | POA: Insufficient documentation

## 2024-01-28 LAB — ECHOCARDIOGRAM COMPLETE
Area-P 1/2: 1.97 cm2
Calc EF: 51.7 %
Est EF: 55
MV VTI: 1.45 cm2
S' Lateral: 3.6 cm
Single Plane A2C EF: 45.7 %
Single Plane A4C EF: 55.1 %

## 2024-01-28 MED ORDER — PERFLUTREN LIPID MICROSPHERE
1.0000 mL | INTRAVENOUS | Status: AC | PRN
  Administered 2024-01-28: 4 mL via INTRAVENOUS
  Filled 2024-01-28: qty 10

## 2024-02-02 ENCOUNTER — Telehealth (HOSPITAL_COMMUNITY): Payer: Self-pay

## 2024-02-02 NOTE — Telephone Encounter (Signed)
 Spoke with patient regarding the following results. Patient made aware and patient verbalized understanding.   Recalls cancelled   Advised patient to call back to office with any issues, questions, or concerns. Patient verbalized understanding.

## 2024-02-02 NOTE — Telephone Encounter (Signed)
-----   Message from [facility] N sent at 02/01/2024  3:58 PM EDT ----- EF has improved. I discussed with Dr. Shirlee Latch. He can graduate from HF clinic. Please update patient. He should follow-up with his regular cardiologist. We should remove any recalls

## 2024-02-07 ENCOUNTER — Encounter: Payer: Self-pay | Admitting: Cardiovascular Disease

## 2024-02-07 MED ORDER — TORSEMIDE 20 MG PO TABS: 20.0000 mg | ORAL_TABLET | Freq: Every day | ORAL | 3 refills | Status: AC

## 2024-02-10 ENCOUNTER — Telehealth (HOSPITAL_COMMUNITY): Payer: Self-pay | Admitting: Cardiology

## 2024-02-10 MED ORDER — ENTRESTO 49-51 MG PO TABS: 1.0000 | ORAL_TABLET | Freq: Two times a day (BID) | ORAL | 3 refills | Status: AC

## 2024-02-10 NOTE — Telephone Encounter (Signed)
 Pt called to request entesto refill  [facility] graduate will forward to [facility]

## 2024-02-10 NOTE — Telephone Encounter (Signed)
 Pt's medication was sent to pt's pharmacy as requested. Confirmation received.

## 2024-02-10 NOTE — Telephone Encounter (Signed)
 Noted  No further nursing outreach needed at this time

## 2024-02-15 ENCOUNTER — Ambulatory Visit: Admitting: Orthopedic Surgery

## 2024-02-15 DIAGNOSIS — S81802D Unspecified open wound, left lower leg, subsequent encounter: Secondary | ICD-10-CM

## 2024-02-18 ENCOUNTER — Encounter: Payer: Self-pay | Admitting: Orthopedic Surgery

## 2024-02-18 NOTE — Progress Notes (Signed)
 Office Visit Note   Patient: [name]           Date of Birth: 10-12-1956           MRN: [id number] Visit Date: 02/15/2024              Requested by: Alfredia Ina, MD [address] [place],  [place] [address] PCP: Alfredia Ina, MD  Chief Complaint  Patient presents with   Left Leg - Wound Check      HPI: Patient is a 68 year old gentleman presents in follow-up for a persistent wound of the left leg status post biologic tissue graft and compression with the Vive sock.  Patient is 4 weeks status post application of donated Kerecis tissue graft.  Assessment & Plan: Visit Diagnoses:  1. Leg wound, left, subsequent encounter     Plan: Patient will continue with the compression sock and routine wound care.  Follow-Up Instructions: Return in about 2 months (around 04/16/2024).   Ortho Exam  Patient is alert, oriented, no adenopathy, well-dressed, normal affect, normal respiratory effort. Examination patient's wound is approximately the same at 3 x 5 cm however the wound bed has healthy flat granulation tissue much improved from the images from last month.  Imaging: No results found. No images are attached to the encounter.  Labs: Lab Results  Component Value Date   HGBA1C 5.4 01/29/2022   HGBA1C 5.2 03/16/2019   REPTSTATUS 01/07/2022 FINAL 01/02/2022   REPTSTATUS 01/08/2022 FINAL 01/02/2022   GRAMSTAIN  01/02/2022    FEW WBC PRESENT,BOTH PMN AND MONONUCLEAR RARE GRAM POSITIVE COCCI RARE GRAM NEGATIVE RODS Performed at [facility], [address] [place], [place] [address]    GRAMSTAIN  01/02/2022    FEW WBC PRESENT,BOTH PMN AND MONONUCLEAR NO ORGANISMS SEEN    CULT  01/02/2022    FEW STREPTOCOCCUS GROUP C Beta hemolytic streptococci are predictably susceptible to penicillin and other beta lactams. Susceptibility testing not routinely performed. ABUNDANT KLEBSIELLA OXYTOCA ABUNDANT STENOTROPHOMONAS MALTOPHILIA    CULT  01/02/2022     NO ANAEROBES ISOLATED Performed at [facility], [address], [place], [place] [address]    LABORGA KLEBSIELLA OXYTOCA 01/02/2022   LABORGA STENOTROPHOMONAS MALTOPHILIA 01/02/2022     Lab Results  Component Value Date   ALBUMIN  4.0 01/01/2023   ALBUMIN  4.0 11/20/2022   ALBUMIN  2.5 (L) 01/30/2022    Lab Results  Component Value Date   MG 2.1 01/29/2022   MG 2.1 06/19/2019   MG 2.2 03/25/2019   No results found for: "VD25OH"  No results found for: "PREALBUMIN"    Latest Ref Rng & Units 11/13/2023    4:28 AM 11/05/2023   11:27 AM 10/29/2023    5:20 PM  CBC EXTENDED  WBC 4.0 - 10.5 K/uL 10.7  7.1  9.7   RBC 4.22 - 5.81 MIL/uL 4.03  4.51  4.27   Hemoglobin 13.0 - 17.0 g/dL 86.5  78.4  69.6   HCT 39.0 - 52.0 % 39.2  44.7  42.0   Platelets 150 - 400 K/uL 175  271  218      There is no height or weight on file to calculate BMI.  Orders:  No orders of the defined types were placed in this encounter.  No orders of the defined types were placed in this encounter.    Procedures: No procedures performed  Clinical Data: No additional findings.  ROS:  All other systems negative, except as  noted in the HPI. Review of Systems  Objective: Vital Signs: There were no vitals taken for this visit.  Specialty Comments:  No specialty comments available.  PMFS History: Patient Active Problem List   Diagnosis Date Noted   Status post revision of total hip 11/12/2023   Recurrent dislocation of right hip joint prosthesis (HCC) 11/11/2023   Left ventricular dysfunction 10/13/2022   Rectal bleeding 01/30/2022   Abnormal finding on imaging 01/30/2022   GIB (gastrointestinal bleeding) 01/29/2022   Hyponatremia 01/29/2022   Hypocalcemia 01/29/2022   Alcohol abuse 01/29/2022   AKI (acute kidney injury) (HCC) 01/29/2022   Symptomatic anemia 01/29/2022   Venous stasis dermatitis of both lower extremities    Bleeding on Coumadin     Leg wound, left, subsequent  encounter 12/30/2021   Compartment syndrome of left lower extremity (HCC) 09/22/2021   Status post surgery 09/22/2021   Compartment syndrome of left upper extremity (HCC) 09/22/2021   Hyperlipidemia 12/13/2020   Atrial fibrillation (HCC) 06/10/2020   Long term (current) use of anticoagulants 06/10/2020   Secondary hypercoagulable state (HCC) 04/24/2020   AV junctional bradycardia 03/22/2019   S/P minimally invasive mitral valve repair  03/21/2019   S/P Maze operation for atrial fibrillation 03/21/2019   Dilated aortic root (HCC) 01/04/2019   Dilated cardiomyopathy (HCC) 01/04/2019   Atrial flutter with rapid ventricular response (HCC)    Mitral valve prolapse    Hypertensive urgency 12/30/2018   Acute on chronic systolic (congestive) heart failure (HCC) 12/30/2018   Acute systolic heart failure (HCC) 12/30/2018   Chronic systolic (congestive) heart failure (HCC)    Persistent atrial fibrillation (HCC) 12/02/2018   Right hamstring muscle strain 07/20/2018   Essential hypertension 02/07/2016   Severe mitral regurgitation    Annual physical exam 11/16/2014   Arthritis of left knee 10/23/2014   Status post total left knee replacement 10/23/2014   Arthritis of knee, right 01/19/2014   Status post total knee replacement 01/19/2014   Benign paroxysmal positional vertigo 10/30/2013   Testicular hypofunction 03/24/2012   Past Medical History:  Diagnosis Date   Acute on chronic systolic (congestive) heart failure (HCC) 12/30/2018   Allergy    Anxiety    Arthritis    Atrial flutter with rapid ventricular response (HCC)    Chronic systolic (congestive) heart failure (HCC)    COVID 10/2021   mild   Dilated aortic root (HCC) 01/04/2019   Dilated cardiomyopathy (HCC) 01/04/2019   Dysrhythmia    Heart murmur    History of blood transfusion    01/27/22   History of colon polyps    Hypertension    Insomnia    Mitral valve prolapse    Mitral valve regurgitation    Persistent atrial  fibrillation (HCC) 12/02/2018   S/P Maze operation for atrial fibrillation 03/21/2019   Complete bilateral atrial lesion set using cryothermy and bipolar radiofrequency ablation with clipping of LA appendage via right mini thoracotomy approach   S/P minimally invasive mitral valve repair 03/21/2019   Complex valvuloplasty including triangular resection of posterior leaflet, artificial Gore-tex neochord placement x4 and 36 mm Sorin Memo 4D ring annuloplasty via right mini thoracotomy approach   Seizures (HCC)    had one approx. 30 yrs ago,has not had any since; due to alchol and no sleep   Severe mitral regurgitation     Family History  Problem Relation Age of Onset   Hypertension Father    Colon cancer Father        dx in his  late 59's   Diabetes Mother    Stomach cancer Neg Hx    Esophageal cancer Neg Hx    Pancreatic cancer Neg Hx    Rectal cancer Neg Hx     Past Surgical History:  Procedure Laterality Date   ANKLE FUSION  left   Dec. 2012   ANTERIOR HIP REVISION Right 11/12/2023   Procedure: REVISION OF RIGHT TOTAL HIP ARTHROPLASTY;  Surgeon: Arnie Lao, MD;  Location: [facility];  Service: Orthopedics;  Laterality: Right;   APPLICATION OF WOUND VAC Left 09/22/2021   Procedure: APPLICATION OF WOUND VAC;  Surgeon: Arnie Lao, MD;  Location: [facility];  Service: Orthopedics;  Laterality: Left;   APPLICATION OF WOUND VAC Left 12/30/2021   Procedure: APPLICATION OF WOUND VAC;  Surgeon: Arnie Lao, MD;  Location: [facility];  Service: Orthopedics;  Laterality: Left;   APPLICATION OF WOUND VAC Left 04/10/2022   Procedure: APPLICATION OF WOUND VAC;  Surgeon: Timothy Ford, MD;  Location: [facility];  Service: Orthopedics;  Laterality: Left;   ARTHROSCOPY KNEE W/ DRILLING  bilateral   2012   ATRIAL FIBRILLATION ABLATION N/A 07/15/2023   Procedure: ATRIAL FIBRILLATION ABLATION;  Surgeon: Efraim Grange, MD;  Location: [facility];  Service: Cardiovascular;   Laterality: N/A;   BIOPSY  01/30/2022   Procedure: BIOPSY;  Surgeon: Brice Campi Albino Alu., MD;  Location: [facility];  Service: Gastroenterology;;   CARDIAC VALVE REPLACEMENT     CARDIOVERSION N/A 01/03/2019   Procedure: CARDIOVERSION;  Surgeon: Jacqueline Matsu, MD;  Location: [facility] ENDOSCOPY;  Service: Cardiovascular;  Laterality: N/A;   CARDIOVERSION N/A 06/23/2019   Procedure: CARDIOVERSION;  Surgeon: Elmyra Haggard, MD;  Location: [facility] ENDOSCOPY;  Service: Cardiovascular;  Laterality: N/A;   CARDIOVERSION N/A 12/11/2022   Procedure: CARDIOVERSION;  Surgeon: Darlis Eisenmenger, MD;  Location: [facility] ENDOSCOPY;  Service: Cardiovascular;  Laterality: N/A;   CLIPPING OF ATRIAL APPENDAGE  03/21/2019   Procedure: Clipping Of Atrial Appendage using 45mm Atricure Pro2 Clip;  Surgeon: Gardenia Jump, MD;  Location: [facility] OR;  Service: Open Heart Surgery;;   COLONOSCOPY     x2   COLONOSCOPY N/A 01/30/2022   Procedure: COLONOSCOPY;  Surgeon: Normie Becton., MD;  Location: [facility];  Service: Gastroenterology;  Laterality: N/A;   ESOPHAGOGASTRODUODENOSCOPY N/A 01/30/2022   Procedure: ESOPHAGOGASTRODUODENOSCOPY (EGD);  Surgeon: Normie Becton., MD;  Location: [facility];  Service: Gastroenterology;  Laterality: N/A;   FOOT ARTHRODESIS  06/23/2012   Procedure: ARTHRODESIS FOOT;  Surgeon: Amada Backer, MD;  Location: [facility] OR;  Service: Orthopedics;  Laterality: Left;  Left Subtalar and Talonavicular Joint Revision Arthrodesis  Aspiration of Bone Marrow from Left Hip    HAIR TRANSPLANT     HARDWARE REMOVAL  06/23/2012   Procedure: HARDWARE REMOVAL;  Surgeon: Amada Backer, MD;  Location: [facility] OR;  Service: Orthopedics;  Laterality: Left;  Removal of Deep Implant  X's 3   I & D EXTREMITY Left 09/23/2021   Procedure: IRRIGATION AND DEBRIDEMENT OF LEG;  Surgeon: Arnie Lao, MD;  [facility];  Service: Orthopedics;  Laterality: Left;   I & D EXTREMITY Left 09/26/2021   Procedure: REPEAT  IRRIGATION AND DEBRIDEMENT LEFT LEG, POSSIBLE WOUND CLOSURE, POSSIBLE VAC CHANGE, POSSIBLE SKIN GRAFT;  Surgeon: Arnie Lao, MD;  Location: [facility];  Service: Orthopedics;  Laterality: Left;   I & D EXTREMITY Left 12/30/2021   Procedure: IRRIGATION AND DEBRIDEMENT LEFT LOWER LEG WOUND;  Surgeon: Lucienne Ryder,  Jeanella Milan, MD;  Location: [facility] OR;  Service: Orthopedics;  Laterality: Left;   I & D EXTREMITY Left 01/02/2022   Procedure: LEFT LEG DEBRIDEMENT AND TISSUE GRAFT;  Surgeon: Timothy Ford, MD;  Location: [facility] OR;  Service: Orthopedics;  Laterality: Left;   INCISION AND DRAINAGE WOUND WITH FASCIOTOMY Left 09/22/2021   Procedure: INCISION AND DRAINAGE WOUND WITH FASCIOTOMY;  Surgeon: Arnie Lao, MD;  [facility]: [facility];  Service: Orthopedics;  Laterality: Left;   INSERTION OF MESH N/A 07/10/2016   Procedure: INSERTION OF MESH;  Surgeon: Oza Blumenthal, MD;  Location: [facility];  Service: General;  Laterality: N/A;   JOINT REPLACEMENT     right hip  01-2011   LEFT HEART CATH AND CORONARY ANGIOGRAPHY N/A 03/16/2019   Procedure: LEFT HEART CATH AND CORONARY ANGIOGRAPHY;  Surgeon: Arnoldo Lapping, MD;  Location: [facility] INVASIVE CV LAB;  Service: Cardiovascular;  Laterality: N/A;   LIMB SPARING RESECTION HIP W/ SADDLE JOINT REPLACEMENT Right    MINIMALLY INVASIVE MAZE PROCEDURE N/A 03/21/2019   Procedure: MINIMALLY INVASIVE MAZE PROCEDURE;  Surgeon: Gardenia Jump, MD;  Location: [facility] OR;  Service: Open Heart Surgery;  Laterality: N/A;   MITRAL VALVE REPAIR Right 03/21/2019   Procedure: MINIMALLY INVASIVE MITRAL VALVE REPAIR (MVR) using Memo 4D ring size 36;  Surgeon: Gardenia Jump, MD;  Location: [facility];  Service: Open Heart Surgery;  Laterality: Right;   POLYPECTOMY  01/30/2022   Procedure: POLYPECTOMY;  Surgeon: Mansouraty, Albino Alu., MD;  Location: [facility];  Service: Gastroenterology;;   SKIN SPLIT GRAFT Left 04/10/2022   Procedure: APPLY SKIN GRAFT TO LEFT LEG  WOUND;  Surgeon: Timothy Ford, MD;  Location: [facility] OR;  Service: Orthopedics;  Laterality: Left;   TEE WITHOUT CARDIOVERSION N/A 01/03/2019   Procedure: TRANSESOPHAGEAL ECHOCARDIOGRAM (TEE);  Surgeon: Jacqueline Matsu, MD;  Location: [facility] ENDOSCOPY;  Service: Cardiovascular;  Laterality: N/A;   TEE WITHOUT CARDIOVERSION N/A 03/21/2019   Procedure: TRANSESOPHAGEAL ECHOCARDIOGRAM (TEE);  Surgeon: Gardenia Jump, MD;  Location: [facility] OR;  Service: Open Heart Surgery;  Laterality: N/A;   TEE WITHOUT CARDIOVERSION N/A 12/11/2022   Procedure: TRANSESOPHAGEAL ECHOCARDIOGRAM (TEE);  Surgeon: Darlis Eisenmenger, MD;  Location: [facility] ENDOSCOPY;  Service: Cardiovascular;  Laterality: N/A;   TEMPORARY PACEMAKER N/A 03/22/2019   Procedure: TEMPORARY PACEMAKER;  Surgeon: Arty Binning, MD;  Location: [facility] INVASIVE CV LAB;  Service: Cardiovascular;  Laterality: N/A;   TOTAL KNEE ARTHROPLASTY Right 01/19/2014   Procedure: RIGHT TOTAL KNEE ARTHROPLASTY, Steroid injection left knee;  Surgeon: Arnie Lao, MD;  Location: [facility];  Service: Orthopedics;  Laterality: Right;   TOTAL KNEE ARTHROPLASTY Left 10/23/2014   Procedure: LEFT TOTAL KNEE ARTHROPLASTY;  Surgeon: Arnie Lao, MD;  Location: [facility];  Service: Orthopedics;  Laterality: Left;   UMBILICAL HERNIA REPAIR N/A 07/10/2016   Procedure: UMBILICAL HERNIA REPAIR;  Surgeon: Oza Blumenthal, MD;  Location: [facility];  Service: General;  Laterality: N/A;   Social History   Occupational History   Occupation: Teaching laboratory technician  Tobacco Use   Smoking status: Never   Smokeless tobacco: Never  Vaping Use   Vaping status: Never Used  Substance and Sexual Activity   Alcohol use: Not Currently    Comment: 2 drinks per day of wine or white claw   Drug use: No   Sexual activity: Yes

## 2024-02-23 ENCOUNTER — Ambulatory Visit: Attending: Cardiovascular Disease

## 2024-02-23 DIAGNOSIS — I4819 Other persistent atrial fibrillation: Secondary | ICD-10-CM

## 2024-02-23 DIAGNOSIS — I4892 Unspecified atrial flutter: Secondary | ICD-10-CM | POA: Diagnosis not present

## 2024-02-23 DIAGNOSIS — Z5181 Encounter for therapeutic drug level monitoring: Secondary | ICD-10-CM | POA: Diagnosis not present

## 2024-02-23 LAB — POCT INR: INR: 2.1 (ref 2.0–3.0)

## 2024-02-23 NOTE — Patient Instructions (Addendum)
 Description   Continue taking warfarin 1.5 tablets daily except 1 tablet on Tuesday, Thursday, and Saturday.   Recheck INR in 6 weeks.  Coumadin  Clinic [phone].

## 2024-03-08 ENCOUNTER — Encounter: Payer: Self-pay | Admitting: Cardiovascular Disease

## 2024-03-08 MED ORDER — SPIRONOLACTONE 25 MG PO TABS: 25.0000 mg | ORAL_TABLET | Freq: Every day | ORAL | 3 refills | Status: AC

## 2024-03-14 ENCOUNTER — Other Ambulatory Visit: Payer: Self-pay | Admitting: Cardiovascular Disease

## 2024-03-14 DIAGNOSIS — I4819 Other persistent atrial fibrillation: Secondary | ICD-10-CM

## 2024-04-05 ENCOUNTER — Ambulatory Visit: Attending: Cardiovascular Disease | Admitting: Pharmacist

## 2024-04-05 DIAGNOSIS — I4819 Other persistent atrial fibrillation: Secondary | ICD-10-CM | POA: Diagnosis not present

## 2024-04-05 DIAGNOSIS — I4892 Unspecified atrial flutter: Secondary | ICD-10-CM

## 2024-04-05 DIAGNOSIS — I4811 Longstanding persistent atrial fibrillation: Secondary | ICD-10-CM

## 2024-04-05 DIAGNOSIS — Z5181 Encounter for therapeutic drug level monitoring: Secondary | ICD-10-CM

## 2024-04-05 DIAGNOSIS — Z7901 Long term (current) use of anticoagulants: Secondary | ICD-10-CM | POA: Diagnosis not present

## 2024-04-05 LAB — POCT INR: INR: 2.7 (ref 2.0–3.0)

## 2024-04-05 NOTE — Patient Instructions (Signed)
 Description   Continue taking warfarin 1.5 tablets daily except 1 tablet on Tuesday, Thursday, and Saturday.   Recheck INR in 8 weeks.  Coumadin  Clinic [phone].

## 2024-04-18 ENCOUNTER — Ambulatory Visit: Admitting: Orthopedic Surgery

## 2024-04-18 DIAGNOSIS — S81802D Unspecified open wound, left lower leg, subsequent encounter: Secondary | ICD-10-CM | POA: Diagnosis not present

## 2024-04-20 ENCOUNTER — Other Ambulatory Visit (INDEPENDENT_AMBULATORY_CARE_PROVIDER_SITE_OTHER): Payer: Self-pay

## 2024-04-20 ENCOUNTER — Ambulatory Visit: Admitting: Orthopaedic Surgery

## 2024-04-20 ENCOUNTER — Encounter: Payer: Self-pay | Admitting: Orthopaedic Surgery

## 2024-04-20 DIAGNOSIS — M7061 Trochanteric bursitis, right hip: Secondary | ICD-10-CM

## 2024-04-20 DIAGNOSIS — Z96649 Presence of unspecified artificial hip joint: Secondary | ICD-10-CM

## 2024-04-20 DIAGNOSIS — M7062 Trochanteric bursitis, left hip: Secondary | ICD-10-CM | POA: Diagnosis not present

## 2024-04-20 MED ORDER — LIDOCAINE HCL 1 % IJ SOLN
3.0000 mL | INTRAMUSCULAR | Status: AC | PRN
  Administered 2024-04-20: 3 mL

## 2024-04-20 MED ORDER — METHYLPREDNISOLONE ACETATE 40 MG/ML IJ SUSP
40.0000 mg | INTRAMUSCULAR | Status: AC | PRN
  Administered 2024-04-20: 40 mg via INTRA_ARTICULAR

## 2024-04-20 NOTE — Progress Notes (Signed)
 [name] comes in today for follow-up as it relates to his right hip.  He has a remote history of a hip replacement back in 2012 and then a few months ago he had a hard mechanical fall and the hip dislocated.  We had do lengthen him with a longer ball and a constrained liner.  The hip is been in place since then but has had a lot of chronic pain over the lateral aspect of the right hip but also his left hip.  He just got back recently from a [place].  Things are really flared up on him.  The hip is stable otherwise.  Hips move smoothly and fluidly.  An AP pelvis shows that the right hip replacement is intact and it looks in appropriate position and the constrained liner is intact.  His left hip joint space is well-maintained.  Both hips hurt over the trochanteric area quite significant.  It was reasonable to place a steroid injection in both hips over the trochanteric areas and he agreed to this and tolerated them very well and that was the point of tenderness on both hips.  Hopefully this is going to calm down the further he gets out from this.  Follow-up can be as needed.  All questions and concerns were addressed and answered.    Procedure Note  Patient: [name]             Date of Birth: [birth year]           MRN: [id number]             Visit Date: 04/20/2024  Procedures: Visit Diagnoses:  1. S/P revision of total hip   2. Trochanteric bursitis, right hip   3. Trochanteric bursitis, left hip     Large Joint Inj: R greater trochanter on 04/20/2024 4:33 PM Indications: pain and diagnostic evaluation Details: 22 G 1.5 in needle, lateral approach  Arthrogram: No  Medications: 3 mL lidocaine  1 %; 40 mg methylPREDNISolone  acetate 40 MG/ML Outcome: tolerated well, no immediate complications Procedure, treatment alternatives, risks and benefits explained, specific risks discussed. Consent was given by the patient. Immediately prior to procedure a time out was  called to verify the correct patient, procedure, equipment, support staff and site/side marked as required. Patient was prepped and draped in the usual sterile fashion.    Large Joint Inj: L greater trochanter on 04/20/2024 4:34 PM Indications: pain and diagnostic evaluation Details: 22 G 1.5 in needle, lateral approach  Arthrogram: No  Medications: 3 mL lidocaine  1 %; 40 mg methylPREDNISolone  acetate 40 MG/ML Outcome: tolerated well, no immediate complications Procedure, treatment alternatives, risks and benefits explained, specific risks discussed. Consent was given by the patient. Immediately prior to procedure a time out was called to verify the correct patient, procedure, equipment, support staff and site/side marked as required. Patient was prepped and draped in the usual sterile fashion.

## 2024-04-24 ENCOUNTER — Encounter: Payer: Self-pay | Admitting: Orthopedic Surgery

## 2024-04-24 NOTE — Progress Notes (Signed)
 Office Visit Note   Patient: [name]           Date of Birth: 04-21-1956           MRN: [id number] Visit Date: 04/18/2024              Requested by: Pura Lenis, MD [address] [place],  [place] [address] PCP: Pura Lenis, MD  Chief Complaint  Patient presents with   Left Leg - Follow-up      HPI: Patient is a 68 year old gentleman who is seen in follow-up for chronic venous insufficiency ulcer left lower extremity.  Patient states there has been no change.  Patient states he has had pink-brownish drainage.  Patient has currently been using a Ace wrap and Silvadene  dressing changes.  Assessment & Plan: Visit Diagnoses:  1. Leg wound, left, subsequent encounter     Plan: Ulcer was cleansed with Vashe there is healthy granulation tissue.  A multilayer compression's wrap was applied follow-up in 1 week.  Discussed the possibility of enrolling in the [employer] study.  Follow-Up Instructions: Return in about 1 week (around 04/25/2024).   Ortho Exam  Patient is alert, oriented, no adenopathy, well-dressed, normal affect, normal respiratory effort. Examination patient has venous swelling with brawny skin color changes of the left leg.  There is a chronic ulcer with healthy granulation tissue with an irregular border.  The largest dimensions are 7 x 4.5 cm however total surface area is less than 20 cm.    Imaging: No results found.   Labs: Lab Results  Component Value Date   HGBA1C 5.4 01/29/2022   HGBA1C 5.2 03/16/2019   REPTSTATUS 01/07/2022 FINAL 01/02/2022   REPTSTATUS 01/08/2022 FINAL 01/02/2022   GRAMSTAIN  01/02/2022    FEW WBC PRESENT,BOTH PMN AND MONONUCLEAR RARE GRAM POSITIVE COCCI RARE GRAM NEGATIVE RODS Performed at [facility], [address] [place], [place] [address]    GRAMSTAIN  01/02/2022    FEW WBC PRESENT,BOTH PMN AND MONONUCLEAR NO ORGANISMS SEEN    CULT  01/02/2022    FEW STREPTOCOCCUS GROUP C Beta hemolytic  streptococci are predictably susceptible to penicillin and other beta lactams. Susceptibility testing not routinely performed. ABUNDANT KLEBSIELLA OXYTOCA ABUNDANT STENOTROPHOMONAS MALTOPHILIA    CULT  01/02/2022    NO ANAEROBES ISOLATED Performed at [facility], [address], [place], [place] [address]    LABORGA KLEBSIELLA OXYTOCA 01/02/2022   LABORGA STENOTROPHOMONAS MALTOPHILIA 01/02/2022     Lab Results  Component Value Date   ALBUMIN  4.0 01/01/2023   ALBUMIN  4.0 11/20/2022   ALBUMIN  2.5 (L) 01/30/2022    Lab Results  Component Value Date   MG 2.1 01/29/2022   MG 2.1 06/19/2019   MG 2.2 03/25/2019   No results found for: VD25OH  No results found for: PREALBUMIN    Latest Ref Rng & Units 11/13/2023    4:28 AM 11/05/2023   11:27 AM 10/29/2023    5:20 PM  CBC EXTENDED  WBC 4.0 - 10.5 K/uL 10.7  7.1  9.7   RBC 4.22 - 5.81 MIL/uL 4.03  4.51  4.27   Hemoglobin 13.0 - 17.0 g/dL 87.3  85.7  86.3   HCT 39.0 - 52.0 % 39.2  44.7  42.0   Platelets 150 - 400 K/uL 175  271  218      There is no height or weight on file to calculate BMI.  Orders:  No orders of the defined types  were placed in this encounter.  No orders of the defined types were placed in this encounter.    Procedures: No procedures performed  Clinical Data: No additional findings.  ROS:  All other systems negative, except as noted in the HPI. Review of Systems  Objective: Vital Signs: There were no vitals taken for this visit.  Specialty Comments:  No specialty comments available.  PMFS History: Patient Active Problem List   Diagnosis Date Noted   Status post revision of total hip 11/12/2023   Recurrent dislocation of right hip joint prosthesis (HCC) 11/11/2023   Left ventricular dysfunction 10/13/2022   Rectal bleeding 01/30/2022   Abnormal finding on imaging 01/30/2022   GIB (gastrointestinal bleeding) 01/29/2022   Hyponatremia 01/29/2022   Hypocalcemia 01/29/2022    Alcohol abuse 01/29/2022   AKI (acute kidney injury) (HCC) 01/29/2022   Symptomatic anemia 01/29/2022   Venous stasis dermatitis of both lower extremities    Bleeding on Coumadin     Leg wound, left, subsequent encounter 12/30/2021   Compartment syndrome of left lower extremity (HCC) 09/22/2021   Status post surgery 09/22/2021   Compartment syndrome of left upper extremity (HCC) 09/22/2021   Hyperlipidemia 12/13/2020   Atrial fibrillation (HCC) 06/10/2020   Long term (current) use of anticoagulants 06/10/2020   Secondary hypercoagulable state (HCC) 04/24/2020   AV junctional bradycardia 03/22/2019   S/P minimally invasive mitral valve repair  03/21/2019   S/P Maze operation for atrial fibrillation 03/21/2019   Dilated aortic root (HCC) 01/04/2019   Dilated cardiomyopathy (HCC) 01/04/2019   Atrial flutter with rapid ventricular response (HCC)    Mitral valve prolapse    Hypertensive urgency 12/30/2018   Acute on chronic systolic (congestive) heart failure (HCC) 12/30/2018   Acute systolic heart failure (HCC) 12/30/2018   Chronic systolic (congestive) heart failure (HCC)    Persistent atrial fibrillation (HCC) 12/02/2018   Right hamstring muscle strain 07/20/2018   Essential hypertension 02/07/2016   Severe mitral regurgitation    Annual physical exam 11/16/2014   Arthritis of left knee 10/23/2014   Status post total left knee replacement 10/23/2014   Arthritis of knee, right 01/19/2014   Status post total knee replacement 01/19/2014   Benign paroxysmal positional vertigo 10/30/2013   Testicular hypofunction 03/24/2012   Past Medical History:  Diagnosis Date   Acute on chronic systolic (congestive) heart failure (HCC) 12/30/2018   Allergy    Anxiety    Arthritis    Atrial flutter with rapid ventricular response (HCC)    Chronic systolic (congestive) heart failure (HCC)    COVID 10/2021   mild   Dilated aortic root (HCC) 01/04/2019   Dilated cardiomyopathy (HCC) 01/04/2019    Dysrhythmia    Heart murmur    History of blood transfusion    01/27/22   History of colon polyps    Hypertension    Insomnia    Mitral valve prolapse    Mitral valve regurgitation    Persistent atrial fibrillation (HCC) 12/02/2018   S/P Maze operation for atrial fibrillation 03/21/2019   Complete bilateral atrial lesion set using cryothermy and bipolar radiofrequency ablation with clipping of LA appendage via right mini thoracotomy approach   S/P minimally invasive mitral valve repair 03/21/2019   Complex valvuloplasty including triangular resection of posterior leaflet, artificial Gore-tex neochord placement x4 and 36 mm Sorin Memo 4D ring annuloplasty via right mini thoracotomy approach   Seizures (HCC)    had one approx. 30 yrs ago,has not had any since; due to alchol and  no sleep   Severe mitral regurgitation     Family History  Problem Relation Age of Onset   Hypertension Father    Colon cancer Father        dx in his late 54's   Diabetes Mother    Stomach cancer Neg Hx    Esophageal cancer Neg Hx    Pancreatic cancer Neg Hx    Rectal cancer Neg Hx     Past Surgical History:  Procedure Laterality Date   ANKLE FUSION  left   Dec. 2012   ANTERIOR HIP REVISION Right 11/12/2023   Procedure: REVISION OF RIGHT TOTAL HIP ARTHROPLASTY;  Surgeon: Vernetta Lonni GRADE, MD;  Location: [facility];  Service: Orthopedics;  Laterality: Right;   APPLICATION OF WOUND VAC Left 09/22/2021   Procedure: APPLICATION OF WOUND VAC;  Surgeon: Vernetta Lonni GRADE, MD;  Location: [facility];  Service: Orthopedics;  Laterality: Left;   APPLICATION OF WOUND VAC Left 12/30/2021   Procedure: APPLICATION OF WOUND VAC;  Surgeon: Vernetta Lonni GRADE, MD;  Location: [facility];  Service: Orthopedics;  Laterality: Left;   APPLICATION OF WOUND VAC Left 04/10/2022   Procedure: APPLICATION OF WOUND VAC;  Surgeon: Harden Jerona GAILS, MD;  Location: [facility];  Service: Orthopedics;  Laterality: Left;   ARTHROSCOPY KNEE W/  DRILLING  bilateral   2012   ATRIAL FIBRILLATION ABLATION N/A 07/15/2023   Procedure: ATRIAL FIBRILLATION ABLATION;  Surgeon: Nancey Eulas BRAVO, MD;  Location: [facility];  Service: Cardiovascular;  Laterality: N/A;   BIOPSY  01/30/2022   Procedure: BIOPSY;  Surgeon: Wilhelmenia Aloha Raddle., MD;  Location: [facility];  Service: Gastroenterology;;   CARDIAC VALVE REPLACEMENT     CARDIOVERSION N/A 01/03/2019   Procedure: CARDIOVERSION;  Surgeon: Shlomo Wilbert SAUNDERS, MD;  Location: [facility]COPY;  Service: Cardiovascular;  Laterality: N/A;   CARDIOVERSION N/A 06/23/2019   Procedure: CARDIOVERSION;  Surgeon: Okey Vina GAILS, MD;  Location: [facility] ENDOSCOPY;  Service: Cardiovascular;  Laterality: N/A;   CARDIOVERSION N/A 12/11/2022   Procedure: CARDIOVERSION;  Surgeon: Rolan Ezra RAMAN, MD;  Location: [facility] ENDOSCOPY;  Service: Cardiovascular;  Laterality: N/A;   CLIPPING OF ATRIAL APPENDAGE  03/21/2019   Procedure: Clipping Of Atrial Appendage using 45mm Atricure Pro2 Clip;  Surgeon: Dusty Sudie DEL, MD;  Location: [facility] OR;  Service: Open Heart Surgery;;   COLONOSCOPY     x2   COLONOSCOPY N/A 01/30/2022   Procedure: COLONOSCOPY;  Surgeon: Wilhelmenia Aloha Raddle., MD;  Location: [facility];  Service: Gastroenterology;  Laterality: N/A;   ESOPHAGOGASTRODUODENOSCOPY N/A 01/30/2022   Procedure: ESOPHAGOGASTRODUODENOSCOPY (EGD);  Surgeon: Wilhelmenia Aloha Raddle., MD;  Location: [facility];  Service: Gastroenterology;  Laterality: N/A;   FOOT ARTHRODESIS  06/23/2012   Procedure: ARTHRODESIS FOOT;  Surgeon: Norleen Armor, MD;  Location[facility] OR;  Service: Orthopedics;  Laterality: Left;  Left Subtalar and Talonavicular Joint Revision Arthrodesis  Aspiration of Bone Marrow from Left Hip    HAIR TRANSPLANT     HARDWARE REMOVAL  06/23/2012   Procedure: HARDWARE REMOVAL;  Surgeon: Norleen Armor, MD;  Location: [facility] OR;  Service: Orthopedics;  Laterality: Left;  Removal of Deep Implant  X's 3   I & D EXTREMITY Left  09/23/2021   Procedure: IRRIGATION AND DEBRIDEMENT OF LEG;  Surgeon: Vernetta Lonni GRADE, MD;  Location: [facility];  Service: Orthopedics;  Laterality: Left;   I & D EXTREMITY Left 09/26/2021   Procedure: REPEAT IRRIGATION AND DEBRIDEMENT LEFT LEG, POSSIBLE WOUND CLOSURE, POSSIBLE VAC CHANGE, POSSIBLE  SKIN GRAFT;  Surgeon: Vernetta Lonni GRADE, MD;  Location: [facility] OR;  Service: Orthopedics;  Laterality: Left;   I & D EXTREMITY Left 12/30/2021   Procedure: IRRIGATION AND DEBRIDEMENT LEFT LOWER LEG WOUND;  Surgeon: Vernetta Lonni GRADE, MD;  Location: [facility];  Service: Orthopedics;  Laterality: Left;   I & D EXTREMITY Left 01/02/2022   Procedure: LEFT LEG DEBRIDEMENT AND TISSUE GRAFT;  Surgeon: Harden Jerona GAILS, MD;  Location: [facility] OR;  Service: Orthopedics;  Laterality: Left;   INCISION AND DRAINAGE WOUND WITH FASCIOTOMY Left 09/22/2021   Procedure: INCISION AND DRAINAGE WOUND WITH FASCIOTOMY;  Surgeon: Vernetta Lonni GRADE, MD;  Location: [facility];  Service: Orthopedics;  Laterality: Left;   INSERTION OF MESH N/A 07/10/2016   Procedure: INSERTION OF MESH;  Surgeon: Vicenta Vernetta, MD;  Location: [facility];  Service: General;  Laterality: N/A;   JOINT REPLACEMENT     right hip  01-2011   LEFT HEART CATH AND CORONARY ANGIOGRAPHY N/A 03/16/2019   Procedure: LEFT HEART CATH AND CORONARY ANGIOGRAPHY;  Surgeon: Wonda Sharper, MD;  Location: [facility] INVASIVE CV LAB;  Service: Cardiovascular;  Laterality: N/A;   LIMB SPARING RESECTION HIP W/ SADDLE JOINT REPLACEMENT Right    MINIMALLY INVASIVE MAZE PROCEDURE N/A 03/21/2019   Procedure: MINIMALLY INVASIVE MAZE PROCEDURE;  Surgeon: Dusty Sudie DEL, MD;  Location: [facility] OR;  Service: Open Heart Surgery;  Laterality: N/A;   MITRAL VALVE REPAIR Right 03/21/2019   Procedure: MINIMALLY INVASIVE MITRAL VALVE REPAIR (MVR) using Memo 4D ring size 36;  Surgeon: Dusty Sudie DEL, MD;  Location: [facility];  Service: Open Heart Surgery;  Laterality: Right;    POLYPECTOMY  01/30/2022   Procedure: POLYPECTOMY;  Surgeon: Mansouraty, Aloha Raddle., MD;  Location: [facility];  Service: Gastroenterology;;   SKIN SPLIT GRAFT Left 04/10/2022   Procedure: APPLY SKIN GRAFT TO LEFT LEG WOUND;  Surgeon: Harden Jerona GAILS, MD;  Location: [facility] OR;  Service: Orthopedics;  Laterality: Left;   TEE WITHOUT CARDIOVERSION N/A 01/03/2019   Procedure: TRANSESOPHAGEAL ECHOCARDIOGRAM (TEE);  Surgeon: Shlomo Wilbert SAUNDERS, MD;  Location: [facility]OSCOPY;  Service: Cardiovascular;  Laterality: N/A;   TEE WITHOUT CARDIOVERSION N/A 03/21/2019   Procedure: TRANSESOPHAGEAL ECHOCARDIOGRAM (TEE);  Surgeon: Dusty Sudie DEL, MD;  Location: [facility] OR;  Service: Open Heart Surgery;  Laterality: N/A;   TEE WITHOUT CARDIOVERSION N/A 12/11/2022   Procedure: TRANSESOPHAGEAL ECHOCARDIOGRAM (TEE);  Surgeon: Rolan Ezra RAMAN, MD;  Location: [facility] ENDOSCOPY;  Service: Cardiovascular;  Laterality: N/A;   TEMPORARY PACEMAKER N/A 03/22/2019   Procedure: TEMPORARY PACEMAKER;  Surgeon: Claudene Victory ORN, MD;  Location: [facility] INVASIVE CV LAB;  Service: Cardiovascular;  Laterality: N/A;   TOTAL KNEE ARTHROPLASTY Right 01/19/2014   Procedure: RIGHT TOTAL KNEE ARTHROPLASTY, Steroid injection left knee;  Surgeon: Lonni GRADE Vernetta, MD;  Location: [facility];  Service: Orthopedics;  Laterality: Right;   TOTAL KNEE ARTHROPLASTY Left 10/23/2014   Procedure: LEFT TOTAL KNEE ARTHROPLASTY;  Surgeon: Lonni GRADE Vernetta, MD;  Location: [facility];  Service: Orthopedics;  Laterality: Left;   UMBILICAL HERNIA REPAIR N/A 07/10/2016   Procedure: UMBILICAL HERNIA REPAIR;  Surgeon: Vicenta Vernetta, MD;  Location: [facility];  Service: General;  Laterality: N/A;   Social History   Occupational History   Occupation: Teaching laboratory technician  Tobacco Use   Smoking status: Never   Smokeless tobacco: Never  Vaping Use   Vaping status: Never Used  Substance and Sexual Activity   Alcohol use: Not Currently  Comment: 2 drinks per day of wine or  white claw   Drug use: No   Sexual activity: Yes

## 2024-04-26 ENCOUNTER — Ambulatory Visit: Admitting: Orthopaedic Surgery

## 2024-05-02 ENCOUNTER — Ambulatory Visit: Admitting: Orthopedic Surgery

## 2024-05-08 ENCOUNTER — Encounter: Payer: Self-pay | Admitting: Orthopedic Surgery

## 2024-05-08 ENCOUNTER — Ambulatory Visit: Admitting: Orthopedic Surgery

## 2024-05-08 DIAGNOSIS — S81802D Unspecified open wound, left lower leg, subsequent encounter: Secondary | ICD-10-CM | POA: Diagnosis not present

## 2024-05-08 MED ORDER — DOXYCYCLINE HYCLATE 100 MG PO TABS: 100.0000 mg | ORAL_TABLET | Freq: Two times a day (BID) | ORAL | 0 refills | Status: DC

## 2024-05-08 MED ORDER — CIPROFLOXACIN HCL 500 MG PO TABS: 500.0000 mg | ORAL_TABLET | Freq: Two times a day (BID) | ORAL | 0 refills | Status: DC

## 2024-05-08 NOTE — Progress Notes (Signed)
 Office Visit Note   Patient: [name]           Date of Birth: 1956/08/12           MRN: [id number] Visit Date: 05/08/2024              Requested by: Pura Lenis, MD [address] [place],  [place] [address] PCP: Pura Lenis, MD  Chief Complaint  Patient presents with   Left Leg - Follow-up      HPI: Patient is a 68 year old gentleman is seen in follow-up for left leg lateral ulcer.  Patient states the ulcer has significantly gotten bigger.  Patient states that he feels that this may have been due to the Vashe and he resumed using Silvadene  and changing the dressing every other day.  Assessment & Plan: Visit Diagnoses: No diagnosis found.  Plan: Will have patient wash the wound with soap and water  daily.  Apply dry 4 x 4 gauze plus an Ace wrap change this twice a day.  Will have him start on doxycycline  and Cipro .  This appears to be a combination of the bodies immune system degrading soft tissue as well as a biofilm.  Follow-Up Instructions: Return in about 2 weeks (around 05/22/2024).   Ortho Exam  Patient is alert, oriented, no adenopathy, well-dressed, normal affect, normal respiratory effort. Examination patient has acute worsening of the wound.  It currently measures 5 x 7 cm.  There is excessive drainage there is no surrounding cellulitis.  The wound has 50% granulation tissue 50% fibrinous exudative tissue.  The wound changes seen most consistent with a biofilm in the body's immune system removing healthy viable tissue.    Imaging: No results found.   Labs: Lab Results  Component Value Date   HGBA1C 5.4 01/29/2022   HGBA1C 5.2 03/16/2019   REPTSTATUS 01/07/2022 FINAL 01/02/2022   REPTSTATUS 01/08/2022 FINAL 01/02/2022   GRAMSTAIN  01/02/2022    FEW WBC PRESENT,BOTH PMN AND MONONUCLEAR RARE GRAM POSITIVE COCCI RARE GRAM NEGATIVE RODS Performed at [facility], [address], [place], [place] [address]    GRAMSTAIN   01/02/2022    FEW WBC PRESENT,BOTH PMN AND MONONUCLEAR NO ORGANISMS SEEN    CULT  01/02/2022    FEW STREPTOCOCCUS GROUP C Beta hemolytic streptococci are predictably susceptible to penicillin and other beta lactams. Susceptibility testing not routinely performed. ABUNDANT KLEBSIELLA OXYTOCA ABUNDANT STENOTROPHOMONAS MALTOPHILIA    CULT  01/02/2022    NO ANAEROBES ISOLATED Performed at [facility], [address], [place], [place] [address]    LABORGA KLEBSIELLA OXYTOCA 01/02/2022   LABORGA STENOTROPHOMONAS MALTOPHILIA 01/02/2022     Lab Results  Component Value Date   ALBUMIN  4.0 01/01/2023   ALBUMIN  4.0 11/20/2022   ALBUMIN  2.5 (L) 01/30/2022    Lab Results  Component Value Date   MG 2.1 01/29/2022   MG 2.1 06/19/2019   MG 2.2 03/25/2019   No results found for: VD25OH  No results found for: PREALBUMIN    Latest Ref Rng & Units 11/13/2023    4:28 AM 11/05/2023   11:27 AM 10/29/2023    5:20 PM  CBC EXTENDED  WBC 4.0 - 10.5 K/uL 10.7  7.1  9.7   RBC 4.22 - 5.81 MIL/uL 4.03  4.51  4.27   Hemoglobin 13.0 - 17.0 g/dL 87.3  85.7  86.3   HCT 39.0 - 52.0 % 39.2  44.7  42.0   Platelets 150 - 400  K/uL 175  271  218      There is no height or weight on file to calculate BMI.  Orders:  No orders of the defined types were placed in this encounter.  Meds ordered this encounter  Medications   doxycycline  (VIBRA -TABS) 100 MG tablet    Sig: Take 1 tablet (100 mg total) by mouth 2 (two) times daily.    Dispense:  30 tablet    Refill:  0   ciprofloxacin  (CIPRO ) 500 MG tablet    Sig: Take 1 tablet (500 mg total) by mouth 2 (two) times daily.    Dispense:  30 tablet    Refill:  0     Procedures: No procedures performed  Clinical Data: No additional findings.  ROS:  All other systems negative, except as noted in the HPI. Review of Systems  Objective: Vital Signs: There were no vitals taken for this visit.  Specialty Comments:  No specialty comments  available.  PMFS History: Patient Active Problem List   Diagnosis Date Noted   Status post revision of total hip 11/12/2023   Recurrent dislocation of right hip joint prosthesis (HCC) 11/11/2023   Left ventricular dysfunction 10/13/2022   Rectal bleeding 01/30/2022   Abnormal finding on imaging 01/30/2022   GIB (gastrointestinal bleeding) 01/29/2022   Hyponatremia 01/29/2022   Hypocalcemia 01/29/2022   Alcohol abuse 01/29/2022   AKI (acute kidney injury) (HCC) 01/29/2022   Symptomatic anemia 01/29/2022   Venous stasis dermatitis of both lower extremities    Bleeding on Coumadin     Leg wound, left, subsequent encounter 12/30/2021   Compartment syndrome of left lower extremity (HCC) 09/22/2021   Status post surgery 09/22/2021   Compartment syndrome of left upper extremity (HCC) 09/22/2021   Hyperlipidemia 12/13/2020   Atrial fibrillation (HCC) 06/10/2020   Long term (current) use of anticoagulants 06/10/2020   Secondary hypercoagulable state (HCC) 04/24/2020   AV junctional bradycardia 03/22/2019   S/P minimally invasive mitral valve repair  03/21/2019   S/P Maze operation for atrial fibrillation 03/21/2019   Dilated aortic root (HCC) 01/04/2019   Dilated cardiomyopathy (HCC) 01/04/2019   Atrial flutter with rapid ventricular response (HCC)    Mitral valve prolapse    Hypertensive urgency 12/30/2018   Acute on chronic systolic (congestive) heart failure (HCC) 12/30/2018   Acute systolic heart failure (HCC) 12/30/2018   Chronic systolic (congestive) heart failure (HCC)    Persistent atrial fibrillation (HCC) 12/02/2018   Right hamstring muscle strain 07/20/2018   Essential hypertension 02/07/2016   Severe mitral regurgitation    Annual physical exam 11/16/2014   Arthritis of left knee 10/23/2014   Status post total left knee replacement 10/23/2014   Arthritis of knee, right 01/19/2014   Status post total knee replacement 01/19/2014   Benign paroxysmal positional vertigo  10/30/2013   Testicular hypofunction 03/24/2012   Past Medical History:  Diagnosis Date   Acute on chronic systolic (congestive) heart failure (HCC) 12/30/2018   Allergy    Anxiety    Arthritis    Atrial flutter with rapid ventricular response (HCC)    Chronic systolic (congestive) heart failure (HCC)    COVID 10/2021   mild   Dilated aortic root (HCC) 01/04/2019   Dilated cardiomyopathy (HCC) 01/04/2019   Dysrhythmia    Heart murmur    History of blood transfusion    01/27/22   History of colon polyps    Hypertension    Insomnia    Mitral valve prolapse    Mitral valve  regurgitation    Persistent atrial fibrillation (HCC) 12/02/2018   S/P Maze operation for atrial fibrillation 03/21/2019   Complete bilateral atrial lesion set using cryothermy and bipolar radiofrequency ablation with clipping of LA appendage via right mini thoracotomy approach   S/P minimally invasive mitral valve repair 03/21/2019   Complex valvuloplasty including triangular resection of posterior leaflet, artificial Gore-tex neochord placement x4 and 36 mm Sorin Memo 4D ring annuloplasty via right mini thoracotomy approach   Seizures (HCC)    had one approx. 30 yrs ago,has not had any since; due to alchol and no sleep   Severe mitral regurgitation     Family History  Problem Relation Age of Onset   Hypertension Father    Colon cancer Father        dx in his late 12's   Diabetes Mother    Stomach cancer Neg Hx    Esophageal cancer Neg Hx    Pancreatic cancer Neg Hx    Rectal cancer Neg Hx     Past Surgical History:  Procedure Laterality Date   ANKLE FUSION  left   Dec. 2012   ANTERIOR HIP REVISION Right 11/12/2023   Procedure: REVISION OF RIGHT TOTAL HIP ARTHROPLASTY;  Surgeon: Vernetta Lonni GRADE, MD;  Location: [facility];  Service: Orthopedics;  Laterality: Right;   APPLICATION OF WOUND VAC Left 09/22/2021   Procedure: APPLICATION OF WOUND VAC;  Surgeon: Vernetta Lonni GRADE, MD;  Location: [facility];  Service: Orthopedics;  Laterality: Left;   APPLICATION OF WOUND VAC Left 12/30/2021   Procedure: APPLICATION OF WOUND VAC;  Surgeon: Vernetta Lonni GRADE, MD;  Location: [facility] OR;  Service: Orthopedics;  Laterality: Left;   APPLICATION OF WOUND VAC Left 04/10/2022   Procedure: APPLICATION OF WOUND VAC;  Surgeon: Harden Jerona GAILS, MD;  Location: [facility];  Service: Orthopedics;  Laterality: Left;   ARTHROSCOPY KNEE W/ DRILLING  bilateral   2012   ATRIAL FIBRILLATION ABLATION N/A 07/15/2023   Procedure: ATRIAL FIBRILLATION ABLATION;  Surgeon: Nancey Eulas BRAVO, MD;  Location: [facility];  Service: Cardiovascular;  Laterality: N/A;   BIOPSY  01/30/2022   Procedure: BIOPSY;  Surgeon: Wilhelmenia Aloha Raddle., MD;  Location: [facility];  Service: Gastroenterology;;   CARDIAC VALVE REPLACEMENT     CARDIOVERSION N/A 01/03/2019   Procedure: CARDIOVERSION;  Surgeon: Shlomo Wilbert SAUNDERS, MD;  Location: [facility] END[facility]COPY;  Service: Cardiovascular;  Laterality: N/A;   CARDIOVERSION N/A 06/23/2019   Procedure: CARDIOVERSION;  Surgeon: Okey Vina GAILS, MD;  Location: [facility] ENDOSCOPY;  Service: Cardiovascular;  Laterality: N/A;   CARDIOVERSION N/A 12/11/2022   Procedure: CARDIOVERSION;  Surgeon: Rolan Ezra RAMAN, MD;  Location: [facility] ENDOSCOPY;  Service: Cardiovascular;  Laterality: N/A;   CLIPPING OF ATRIAL APPENDAGE  03/21/2019   Procedure: Clipping Of Atrial Appendage using 45mm Atricure Pro2 Clip;  Surgeon: Dusty Sudie DEL, MD;  Location: [facility] OR;  Service: Open Heart Surgery;;   COLONOSCOPY     x2   COLONOSCOPY N/A 01/30/2022   Procedure: COLONOSCOPY;  Surgeon: Wilhelmenia Aloha Raddle., MD;  Location: [facility];  Service: Gastroenterology;  Laterality: N/A;   ESOPHAGOGASTRODUODENOSCOPY N/A 01/30/2022   Procedure: ESOPHAGOGASTRODUODENOSCOPY (EGD);  Surgeon: Wilhelmenia Aloha Raddle., MD;  Location: [facility];  Service: Gastroenterology;  Laterality: N/A;   FOOT ARTHRODESIS  06/23/2012   Procedure: ARTHRODESIS FOOT;   Surgeon: Norleen Armor, MD;  Location: [facility] OR;  Service: Orthopedics;  Laterality: Left;  Left Subtalar and Talonavicular Joint Revision Arthrodesis  Aspiration of Bone Marrow from  Left Hip    HAIR TRANSPLANT     HARDWARE REMOVAL  06/23/2012   Procedure: HARDWARE REMOVAL;  Surgeon: Norleen Armor, MD;  Location: [facility] OR;  Service: Orthopedics;  Laterality: Left;  Removal of Deep Implant  X's 3   I & D EXTREMITY Left 09/23/2021   Procedure: IRRIGATION AND DEBRIDEMENT OF LEG;  Surgeon: Vernetta Lonni GRADE, MD;  Location: [facility];  Service: Orthopedics;  Laterality: Left;   I & D EXTREMITY Left 09/26/2021   Procedure: REPEAT IRRIGATION AND DEBRIDEMENT LEFT LEG, POSSIBLE WOUND CLOSURE, POSSIBLE VAC CHANGE, POSSIBLE SKIN GRAFT;  Surgeon: Vernetta Lonni GRADE, MD;  Location: [facility];  Service: Orthopedics;  Laterality: Left;   I & D EXTREMITY Left 12/30/2021   Procedure: IRRIGATION AND DEBRIDEMENT LEFT LOWER LEG WOUND;  Surgeon: Vernetta Lonni GRADE, MD;  Location: [facility];  Service: Orthopedics;  Laterality: Left;   I & D EXTREMITY Left 01/02/2022   Procedure: LEFT LEG DEBRIDEMENT AND TISSUE GRAFT;  Surgeon: Harden Jerona GAILS, MD;  Location: [facility] OR;  Service: Orthopedics;  Laterality: Left;   INCISION AND DRAINAGE WOUND WITH FASCIOTOMY Left 09/22/2021   Procedure: INCISION AND DRAINAGE WOUND WITH FASCIOTOMY;  Surgeon: Vernetta Lonni GRADE, MD;  Location: [facility];  Service: Orthopedics;  Laterality: Left;   INSERTION OF MESH N/A 07/10/2016   Procedure: INSERTION OF MESH;  Surgeon: Vicenta Vernetta, MD;  Location: [facility];  Service: General;  Laterality: N/A;   JOINT REPLACEMENT     right hip  01-2011   LEFT HEART CATH AND CORONARY ANGIOGRAPHY N/A 03/16/2019   Procedure: LEFT HEART CATH AND CORONARY ANGIOGRAPHY;  Surgeon: Wonda Sharper, MD;  Location: [facility] INVASIVE CV LAB;  Service: Cardiovascular;  Laterality: N/A;   LIMB SPARING RESECTION HIP W/ SADDLE JOINT REPLACEMENT Right    MINIMALLY  INVASIVE MAZE PROCEDURE N/A 03/21/2019   Procedure: MINIMALLY INVASIVE MAZE PROCEDURE;  Surgeon: Dusty Sudie DEL, MD;  Location: [facility] OR;  Service: Open Heart Surgery;  Laterality: N/A;   MITRAL VALVE REPAIR Right 03/21/2019   Procedure: MINIMALLY INVASIVE MITRAL VALVE REPAIR (MVR) using Memo 4D ring size 36;  Surgeon: Dusty Sudie DEL, MD;  Location: [facility];  Service: Open Heart Surgery;  Laterality: Right;   POLYPECTOMY  01/30/2022   Procedure: POLYPECTOMY;  Surgeon: Mansouraty, Aloha Raddle., MD;  Location: [facility];  Service: Gastroenterology;;   SKIN SPLIT GRAFT Left 04/10/2022   Procedure: APPLY SKIN GRAFT TO LEFT LEG WOUND;  Surgeon: Harden Jerona GAILS, MD;  Location: [facility] OR;  Service: Orthopedics;  Laterality: Left;   TEE WITHOUT CARDIOVERSION N/A 01/03/2019   Procedure: TRANSESOPHAGEAL ECHOCARDIOGRAM (TEE);  Surgeon: Shlomo Wilbert SAUNDERS, MD;  Location: [facility] ENDOSCOPY;  Service: Cardiovascular;  Laterality: N/A;   TEE WITHOUT CARDIOVERSION N/A 03/21/2019   Procedure: TRANSESOPHAGEAL ECHOCARDIOGRAM (TEE);  Surgeon: Dusty Sudie DEL, MD;  Location: [facility] OR;  Service: Open Heart Surgery;  Laterality: N/A;   TEE WITHOUT CARDIOVERSION N/A 12/11/2022   Procedure: TRANSESOPHAGEAL ECHOCARDIOGRAM (TEE);  Surgeon: Rolan Ezra RAMAN, MD;  Location: [facility] ENDOSCOPY;  Service: Cardiovascular;  Laterality: N/A;   TEMPORARY PACEMAKER N/A 03/22/2019   Procedure: TEMPORARY PACEMAKER;  Surgeon: Claudene Victory ORN, MD;  Location: [facility] INVASIVE CV LAB;  Service: Cardiovascular;  Laterality: N/A;   TOTAL KNEE ARTHROPLASTY Right 01/19/2014   Procedure: RIGHT TOTAL KNEE ARTHROPLASTY, Steroid injection left knee;  Surgeon: Lonni GRADE Vernetta, MD;  Location: [facility];  Service: Orthopedics;  Laterality: Right;   TOTAL KNEE ARTHROPLASTY Left 10/23/2014   Procedure:  LEFT TOTAL KNEE ARTHROPLASTY;  Surgeon: [name], MD;  Location: [facility];  Service: Orthopedics;  Laterality: Left;   UMBILICAL HERNIA REPAIR N/A 07/10/2016   Procedure:  UMBILICAL HERNIA REPAIR;  Surgeon: Vicenta Poli, MD;  Location: [facility];  Service: General;  Laterality: N/A;   Social History   Occupational History   Occupation: Teaching laboratory technician  Tobacco Use   Smoking status: Never   Smokeless tobacco: Never  Vaping Use   Vaping status: Never Used  Substance and Sexual Activity   Alcohol use: Not Currently    Comment: 2 drinks per day of wine or white claw   Drug use: No   Sexual activity: Yes

## 2024-05-26 ENCOUNTER — Other Ambulatory Visit: Payer: Self-pay | Admitting: Cardiovascular Disease

## 2024-05-26 DIAGNOSIS — I4819 Other persistent atrial fibrillation: Secondary | ICD-10-CM

## 2024-05-29 ENCOUNTER — Ambulatory Visit: Admitting: Orthopedic Surgery

## 2024-05-29 DIAGNOSIS — S81802D Unspecified open wound, left lower leg, subsequent encounter: Secondary | ICD-10-CM

## 2024-05-30 ENCOUNTER — Encounter: Payer: Self-pay | Admitting: Orthopedic Surgery

## 2024-05-30 ENCOUNTER — Telehealth: Payer: Self-pay | Admitting: Orthopedic Surgery

## 2024-05-30 NOTE — Progress Notes (Signed)
 Office Visit Note   Patient: [name]           Date of Birth: [birth year]           MRN: [id number] Visit Date: 05/29/2024              Requested by: Pura Lenis, MD [address] [place],  [place] [address] PCP: Pura Lenis, MD  Chief Complaint  Patient presents with   Left Leg - Follow-up      HPI: Patient is a 68 year old gentleman who presents with a chronic wound to the lateral aspect of the left leg.  The healing has stalled.  Patient has tried different wound treatment options and has completed a course of doxycycline .  The ulcer has been present for 2 years and 4 months.  Assessment & Plan: Visit Diagnoses: No diagnosis found.  Plan: Patient has most likely developed a biofilm that has not been affected by antibiotics or with Vashe.  Will plan for surgical debridement application of Kerecis tissue graft and application of a peel in place wound VAC dressing.  Plan for outpatient surgery.  Follow-Up Instructions: Return in about 2 weeks (around 06/12/2024).   Ortho Exam  Patient is alert, oriented, no adenopathy, well-dressed, normal affect, normal respiratory effort. Examination the wound bed has flat healthy granulation tissue it measures 6.5 x 4.5 cm.  There is no surrounding cellulitis no odor or drainage.  Patient states the wound got bigger when using Vashe dressing changes.    Imaging: No results found. No images are attached to the encounter.  Labs: Lab Results  Component Value Date   HGBA1C 5.4 01/29/2022   HGBA1C 5.2 03/16/2019   REPTSTATUS 01/07/2022 FINAL 01/02/2022   REPTSTATUS 01/08/2022 FINAL 01/02/2022   GRAMSTAIN  01/02/2022    FEW WBC PRESENT,BOTH PMN AND MONONUCLEAR RARE GRAM POSITIVE COCCI RARE GRAM NEGATIVE RODS Performed at [facility], [address], [place], [place] [address]    GRAMSTAIN  01/02/2022    FEW WBC PRESENT,BOTH PMN AND MONONUCLEAR NO ORGANISMS SEEN    CULT  01/02/2022    FEW  STREPTOCOCCUS GROUP C Beta hemolytic streptococci are predictably susceptible to penicillin and other beta lactams. Susceptibility testing not routinely performed. ABUNDANT KLEBSIELLA OXYTOCA ABUNDANT STENOTROPHOMONAS MALTOPHILIA    CULT  01/02/2022    NO ANAEROBES ISOLATED Performed at [facility], [address] [place], [place] [address]    LABORGA KLEBSIELLA OXYTOCA 01/02/2022   LABORGA STENOTROPHOMONAS MALTOPHILIA 01/02/2022     Lab Results  Component Value Date   ALBUMIN  4.0 01/01/2023   ALBUMIN  4.0 11/20/2022   ALBUMIN  2.5 (L) 01/30/2022    Lab Results  Component Value Date   MG 2.1 01/29/2022   MG 2.1 06/19/2019   MG 2.2 03/25/2019   No results found for: VD25OH  No results found for: PREALBUMIN    Latest Ref Rng & Units 11/13/2023    4:28 AM 11/05/2023   11:27 AM 10/29/2023    5:20 PM  CBC EXTENDED  WBC 4.0 - 10.5 K/uL 10.7  7.1  9.7   RBC 4.22 - 5.81 MIL/uL 4.03  4.51  4.27   Hemoglobin 13.0 - 17.0 g/dL 87.3  85.7  86.3   HCT 39.0 - 52.0 % 39.2  44.7  42.0   Platelets 150 - 400 K/uL 175  271  218      There is no height or weight on file to calculate BMI.  Orders:  No orders of the defined types were placed in this encounter.  No orders of the defined types were placed in this encounter.    Procedures: No procedures performed  Clinical Data: No additional findings.  ROS:  All other systems negative, except as noted in the HPI. Review of Systems  Objective: Vital Signs: There were no vitals taken for this visit.  Specialty Comments:  No specialty comments available.  PMFS History: Patient Active Problem List   Diagnosis Date Noted   Status post revision of total hip 11/12/2023   Recurrent dislocation of right hip joint prosthesis (HCC) 11/11/2023   Left ventricular dysfunction 10/13/2022   Rectal bleeding 01/30/2022   Abnormal finding on imaging 01/30/2022   GIB (gastrointestinal bleeding) 01/29/2022   Hyponatremia  01/29/2022   Hypocalcemia 01/29/2022   Alcohol abuse 01/29/2022   AKI (acute kidney injury) (HCC) 01/29/2022   Symptomatic anemia 01/29/2022   Venous stasis dermatitis of both lower extremities    Bleeding on Coumadin     Leg wound, left, subsequent encounter 12/30/2021   Compartment syndrome of left lower extremity (HCC) 09/22/2021   Status post surgery 09/22/2021   Compartment syndrome of left upper extremity (HCC) 09/22/2021   Hyperlipidemia 12/13/2020   Atrial fibrillation (HCC) 06/10/2020   Long term (current) use of anticoagulants 06/10/2020   Secondary hypercoagulable state (HCC) 04/24/2020   AV junctional bradycardia 03/22/2019   S/P minimally invasive mitral valve repair  03/21/2019   S/P Maze operation for atrial fibrillation 03/21/2019   Dilated aortic root (HCC) 01/04/2019   Dilated cardiomyopathy (HCC) 01/04/2019   Atrial flutter with rapid ventricular response (HCC)    Mitral valve prolapse    Hypertensive urgency 12/30/2018   Acute on chronic systolic (congestive) heart failure (HCC) 12/30/2018   Acute systolic heart failure (HCC) 12/30/2018   Chronic systolic (congestive) heart failure (HCC)    Persistent atrial fibrillation (HCC) 12/02/2018   Right hamstring muscle strain 07/20/2018   Essential hypertension 02/07/2016   Severe mitral regurgitation    Annual physical exam 11/16/2014   Arthritis of left knee 10/23/2014   Status post total left knee replacement 10/23/2014   Arthritis of knee, right 01/19/2014   Status post total knee replacement 01/19/2014   Benign paroxysmal positional vertigo 10/30/2013   Testicular hypofunction 03/24/2012   Past Medical History:  Diagnosis Date   Acute on chronic systolic (congestive) heart failure (HCC) 12/30/2018   Allergy    Anxiety    Arthritis    Atrial flutter with rapid ventricular response (HCC)    Chronic systolic (congestive) heart failure (HCC)    COVID 10/2021   mild   Dilated aortic root (HCC) 01/04/2019    Dilated cardiomyopathy (HCC) 01/04/2019   Dysrhythmia    Heart murmur    History of blood transfusion    01/27/22   History of colon polyps    Hypertension    Insomnia    Mitral valve prolapse    Mitral valve regurgitation    Persistent atrial fibrillation (HCC) 12/02/2018   S/P Maze operation for atrial fibrillation 03/21/2019   Complete bilateral atrial lesion set using cryothermy and bipolar radiofrequency ablation with clipping of LA appendage via right mini thoracotomy approach   S/P minimally invasive mitral valve repair 03/21/2019   Complex valvuloplasty including triangular resection of posterior leaflet, artificial Gore-tex neochord placement x4 and 36 mm Sorin Memo 4D ring annuloplasty via right mini thoracotomy approach   Seizures (HCC)    had one approx. 30 yrs ago,has not had  any since; due to alchol and no sleep   Severe mitral regurgitation     Family History  Problem Relation Age of Onset   Hypertension Father    Colon cancer Father        dx in his late 37's   Diabetes Mother    Stomach cancer Neg Hx    Esophageal cancer Neg Hx    Pancreatic cancer Neg Hx    Rectal cancer Neg Hx     Past Surgical History:  Procedure Laterality Date   ANKLE FUSION  left   Dec. 2012   ANTERIOR HIP REVISION Right 11/12/2023   Procedure: REVISION OF RIGHT TOTAL HIP ARTHROPLASTY;  Surgeon: Vernetta Lonni GRADE, MD;  Location: [facility];  Service: Orthopedics;  Laterality: Right;   APPLICATION OF WOUND VAC Left 09/22/2021   Procedure: APPLICATION OF WOUND VAC;  Surgeon: Vernetta Lonni GRADE, MD;  Location: [facility];  Service: Orthopedics;  Laterality: Left;   APPLICATION OF WOUND VAC Left 12/30/2021   Procedure: APPLICATION OF WOUND VAC;  Surgeon: Vernetta Lonni GRADE, MD;  Location: [facility];  Service: Orthopedics;  Laterality: Left;   APPLICATION OF WOUND VAC Left 04/10/2022   Procedure: APPLICATION OF WOUND VAC;  Surgeon: Harden Jerona GAILS, MD;  Location: [facility];  Service: Orthopedics;   Laterality: Left;   ARTHROSCOPY KNEE W/ DRILLING  bilateral   2012   ATRIAL FIBRILLATION ABLATION N/A 07/15/2023   Procedure: ATRIAL FIBRILLATION ABLATION;  Surgeon: Nancey Eulas BRAVO, MD;  [facility]: [facility];  Service: Cardiovascular;  Laterality: N/A;   BIOPSY  01/30/2022   Procedure: BIOPSY;  Surgeon: Wilhelmenia Aloha Raddle., MD;  Location: [facility];  Service: Gastroenterology;;   CARDIAC VALVE REPLACEMENT     CARDIOVERSION N/A 01/03/2019   Procedure: CARDIOVERSION;  Surgeon: Shlomo Wilbert SAUNDERS, MD;  Location: [facility];  Service: Cardiovascular;  Laterality: N/A;   CARDIOVERSION N/A 06/23/2019   Procedure: CARDIOVERSION;  Surgeon: Okey Vina GAILS, MD;  Location: [facility] ENDOSCOPY;  Service: Cardiovascular;  Laterality: N/A;   CARDIOVERSION N/A 12/11/2022   Procedure: CARDIOVERSION;  Surgeon: Rolan Ezra RAMAN, MD;  Location: [facility] ENDOSCOPY;  Service: Cardiovascular;  Laterality: N/A;   CLIPPING OF ATRIAL APPENDAGE  03/21/2019   Procedure: Clipping Of Atrial Appendage using 45mm Atricure Pro2 Clip;  Surgeon: Dusty Sudie DEL, MD;  Location: [facility] OR;  Service: Open Heart Surgery;;   COLONOSCOPY     x2   COLONOSCOPY N/A 01/30/2022   Procedure: COLONOSCOPY;  Surgeon: Wilhelmenia Aloha Raddle., MD;  Location: [facility];  Service: Gastroenterology;  Laterality: N/A;   ESOPHAGOGASTRODUODENOSCOPY N/A 01/30/2022   Procedure: ESOPHAGOGASTRODUODENOSCOPY (EGD);  Surgeon: Wilhelmenia Aloha Raddle., MD;  Location: [facility];  Service: Gastroenterology;  Laterality: N/A;   FOOT ARTHRODESIS  06/23/2012   Procedure: ARTHRODESIS FOOT;  Surgeon: Norleen Armor, MD;  Location: [facility] OR;  Service: Orthopedics;  Laterality: Left;  Left Subtalar and Talonavicular Joint Revision Arthrodesis  Aspiration of Bone Marrow from Left Hip    HAIR TRANSPLANT     HARDWARE REMOVAL  06/23/2012   Procedure: HARDWARE REMOVAL;  Surgeon: Norleen Armor, MD;  Location: [facility] OR;  Service: Orthopedics;  Laterality: Left;  Removal of Deep Implant   X's 3   I & D EXTREMITY Left 09/23/2021   Procedure: IRRIGATION AND DEBRIDEMENT OF LEG;  Surgeon: Vernetta Lonni GRADE, MD;  [facility];  Service: Orthopedics;  Laterality: Left;   I & D EXTREMITY Left 09/26/2021   Procedure: REPEAT IRRIGATION AND DEBRIDEMENT LEFT LEG, POSSIBLE  WOUND CLOSURE, POSSIBLE VAC CHANGE, POSSIBLE SKIN GRAFT;  Surgeon: Vernetta Lonni GRADE, MD;  [facility]: [facility];  Service: Orthopedics;  Laterality: Left;   I & D EXTREMITY Left 12/30/2021   Procedure: IRRIGATION AND DEBRIDEMENT LEFT LOWER LEG WOUND;  Surgeon: Vernetta Lonni GRADE, MD;  [facility];  Service: Orthopedics;  Laterality: Left;   I & D EXTREMITY Left 01/02/2022   Procedure: LEFT LEG DEBRIDEMENT AND TISSUE GRAFT;  Surgeon: Harden Jerona GAILS, MD;  Location: [facility] OR;  Service: Orthopedics;  Laterality: Left;   INCISION AND DRAINAGE WOUND WITH FASCIOTOMY Left 09/22/2021   Procedure: INCISION AND DRAINAGE WOUND WITH FASCIOTOMY;  Surgeon: Vernetta Lonni GRADE, MD;  [facility];  Service: Orthopedics;  Laterality: Left;   INSERTION OF MESH N/A 07/10/2016   Procedure: INSERTION OF MESH;  Surgeon: Vicenta Vernetta, MD;  Location: [facility];  Service: General;  Laterality: N/A;   JOINT REPLACEMENT     right hip  01-2011   LEFT HEART CATH AND CORONARY ANGIOGRAPHY N/A 03/16/2019   Procedure: LEFT HEART CATH AND CORONARY ANGIOGRAPHY;  Surgeon: Wonda Sharper, MD;  Location: [facility] INVASIVE CV LAB;  Service: Cardiovascular;  Laterality: N/A;   LIMB SPARING RESECTION HIP W/ SADDLE JOINT REPLACEMENT Right    MINIMALLY INVASIVE MAZE PROCEDURE N/A 03/21/2019   Procedure: MINIMALLY INVASIVE MAZE PROCEDURE;  Surgeon: Dusty Sudie DEL, MD;  Location: [facility] OR;  Service: Open Heart Surgery;  Laterality: N/A;   MITRAL VALVE REPAIR Right 03/21/2019   Procedure: MINIMALLY INVASIVE MITRAL VALVE REPAIR (MVR) using Memo 4D ring size 36;  Surgeon: Dusty Sudie DEL, MD;  [facility];  Service: Open Heart Surgery;   Laterality: Right;   POLYPECTOMY  01/30/2022   Procedure: POLYPECTOMY;  Surgeon: Mansouraty, Aloha Raddle., MD;  Location: [facility];  Service: Gastroenterology;;   SKIN SPLIT GRAFT Left 04/10/2022   Procedure: APPLY SKIN GRAFT TO LEFT LEG WOUND;  Surgeon: Harden Jerona GAILS, MD;  Location: [facility] OR;  Service: Orthopedics;  Laterality: Left;   TEE WITHOUT CARDIOVERSION N/A 01/03/2019   Procedure: TRANSESOPHAGEAL ECHOCARDIOGRAM (TEE);  Surgeon: Shlomo Wilbert SAUNDERS, MD;  Location: [facility] ENDOSCOPY;  Service: Cardiovascular;  Laterality: N/A;   TEE WITHOUT CARDIOVERSION N/A 03/21/2019   Procedure: TRANSESOPHAGEAL ECHOCARDIOGRAM (TEE);  Surgeon: Dusty Sudie DEL, MD;  Location: [facility] OR;  Service: Open Heart Surgery;  Laterality: N/A;   TEE WITHOUT CARDIOVERSION N/A 12/11/2022   Procedure: TRANSESOPHAGEAL ECHOCARDIOGRAM (TEE);  Surgeon: Rolan Ezra RAMAN, MD;  Location: [facility] ENDOSCOPY;  Service: Cardiovascular;  Laterality: N/A;   TEMPORARY PACEMAKER N/A 03/22/2019   Procedure: TEMPORARY PACEMAKER;  Surgeon: Claudene Victory ORN, MD;  Location: [facility] INVASIVE CV LAB;  Service: Cardiovascular;  Laterality: N/A;   TOTAL KNEE ARTHROPLASTY Right 01/19/2014   Procedure: RIGHT TOTAL KNEE ARTHROPLASTY, Steroid injection left knee;  Surgeon: Lonni GRADE Vernetta, MD;  Location: [facility];  Service: Orthopedics;  Laterality: Right;   TOTAL KNEE ARTHROPLASTY Left 10/23/2014   Procedure: LEFT TOTAL KNEE ARTHROPLASTY;  Surgeon: Lonni GRADE Vernetta, MD;  Location: [facility];  Service: Orthopedics;  Laterality: Left;   UMBILICAL HERNIA REPAIR N/A 07/10/2016   Procedure: UMBILICAL HERNIA REPAIR;  Surgeon: Vicenta Vernetta, MD;  Location: [facility];  Service: General;  Laterality: N/A;   Social History   Occupational History   Occupation: Teaching laboratory technician  Tobacco Use   Smoking status: Never   Smokeless tobacco: Never  Vaping Use   Vaping status: Never Used  Substance and Sexual Activity   Alcohol  use: Not Currently    Comment: 2  drinks per day of wine or white claw   Drug use: No   Sexual activity: Yes

## 2024-05-30 NOTE — Telephone Encounter (Signed)
 Unable to reach patient at [phone] to provide surgery details. Patient's voicemail is not set up.  Patient is scheduled for left leg debridement & tissue graft at [facility] Main with Dr. Harden 06-09-24. Post op appointment has been scheduled 06-16-24 @ 10:45 with Erin.   Will try to reach patient later this afternoon.

## 2024-05-31 ENCOUNTER — Ambulatory Visit: Attending: Cardiovascular Disease | Admitting: *Deleted

## 2024-05-31 DIAGNOSIS — Z7901 Long term (current) use of anticoagulants: Secondary | ICD-10-CM

## 2024-05-31 DIAGNOSIS — I4892 Unspecified atrial flutter: Secondary | ICD-10-CM

## 2024-05-31 DIAGNOSIS — I4819 Other persistent atrial fibrillation: Secondary | ICD-10-CM

## 2024-05-31 DIAGNOSIS — I4811 Longstanding persistent atrial fibrillation: Secondary | ICD-10-CM

## 2024-05-31 DIAGNOSIS — Z5181 Encounter for therapeutic drug level monitoring: Secondary | ICD-10-CM

## 2024-05-31 LAB — POCT INR: INR: 2.8 (ref 2.0–3.0)

## 2024-05-31 NOTE — Patient Instructions (Signed)
 Description   Continue taking warfarin 1.5 tablets daily except 1 tablet on Tuesday, Thursday, and Saturday.   Recheck INR in 8 weeks.  Coumadin  Clinic [phone].

## 2024-05-31 NOTE — Progress Notes (Signed)
 INR-2.8 Please see anticoagulation encounter

## 2024-06-07 ENCOUNTER — Other Ambulatory Visit: Payer: Self-pay

## 2024-06-07 ENCOUNTER — Encounter (HOSPITAL_COMMUNITY): Payer: Self-pay | Admitting: Orthopedic Surgery

## 2024-06-07 NOTE — Progress Notes (Addendum)
 PCP - Pura Lenis, MD Cardiologist - Court Dorn PARAS, MD Electrophysiology - Cardiology Mealor, Eulas BRAVO, MD   PPM/ICD - denies Device Orders - n/a Rep Notified - n/a  Chest x-ray - 10-17-23 EKG - 12-30-23 Stress Test - denies ECHO - 01-28-24 Cardiac Cath - 03-16-19  CPAP - n/a  DM- denies  Blood Thinner Instructions: warfarin (COUMADIN ) Last dose 06-06-24. He is schedule not to take on Wednesday and Thursday pr patient. Aspirin  Instructions: denies  ERAS Protcol - Clear liquids until 6:00 am.  COVID TEST- n/a  Anesthesia review: yes, hx of Afib, HTN, mitral regurgitation  Patient verbally denies any shortness of breath, fever, cough and chest pain during phone call   -------------  SDW INSTRUCTIONS given:  Your procedure is scheduled on June 09, 2024.  Report to [facility] Main Entrance A at 6:30 A.M., and check in at the Admitting office.  Call this number if you have problems the morning of surgery:  [phone]   Remember:  Do not eat after midnight the night before your surgery  You may drink clear liquids until 6:00 the morning of your surgery.   Clear liquids allowed are: Water , Non-Citrus Juices (without pulp), Carbonated Beverages, Clear Tea, Black Coffee Only, and Gatorade    Take these medicines the morning of surgery with A SIP OF WATER   ALPRAZolam  (XANAX )  atorvastatin  (LIPITOR)  meclizine  (ANTIVERT )  metoprolol  succinate (TOPROL -XL)  oxyCODONE -acetaminophen  (PERCOCET)    As of today, STOP taking any Aspirin  (unless otherwise instructed by your surgeon) Aleve , Naproxen , Ibuprofen, Motrin, Advil, Goody's, BC's, all herbal medications, fish oil, and all vitamins.                      Do not wear jewelry, make up, or nail polish            Do not wear lotions, powders, perfumes/colognes, or deodorant.            Do not shave 48 hours prior to surgery.  Men may shave face and neck.            Do not bring valuables to the hospital.             [facility] is not responsible for any belongings or valuables.  Do NOT Smoke (Tobacco/Vaping) 24 hours prior to your procedure If you use a CPAP at night, you may bring all equipment for your overnight stay.   Contacts, glasses, dentures or bridgework may not be worn into surgery.      For patients admitted to the hospital, discharge time will be determined by your treatment team.   Patients discharged the day of surgery will not be allowed to drive home, and someone needs to stay with them for 24 hours.    Special instructions:   [facility]- Preparing For Surgery  Before surgery, you can play an important role. Because skin is not sterile, your skin needs to be as free of germs as possible. You can reduce the number of germs on your skin by washing with CHG (chlorahexidine gluconate) Soap before surgery.  CHG is an antiseptic cleaner which kills germs and bonds with the skin to continue killing germs even after washing.    Oral Hygiene is also important to reduce your risk of infection.  Remember - BRUSH YOUR TEETH THE MORNING OF SURGERY WITH YOUR REGULAR TOOTHPASTE  Please do not use if you have an allergy to CHG or antibacterial soaps. If your skin  becomes reddened/irritated stop using the CHG.  Do not shave (including legs and underarms) for at least 48 hours prior to first CHG shower. It is OK to shave your face.  Please follow these instructions carefully.   Shower the NIGHT BEFORE SURGERY and the MORNING OF SURGERY with DIAL Soap.   Pat yourself dry with a CLEAN TOWEL.  Wear CLEAN PAJAMAS to bed the night before surgery  Place CLEAN SHEETS on your bed the night of your first shower and DO NOT SLEEP WITH PETS.   Day of Surgery: Please shower morning of surgery  Wear Clean/Comfortable clothing the morning of surgery Do not apply any deodorants/lotions.   Remember to brush your teeth WITH YOUR REGULAR TOOTHPASTE.   Questions were answered. Patient verbalized  understanding of instructions.

## 2024-06-08 NOTE — Progress Notes (Signed)
 Anesthesia Chart Review: SAME DAY WORK-UP  Case: [id number] Date/Time: 06/09/24 0845   Procedure: IRRIGATION AND DEBRIDEMENT WOUND (Left: Leg Lower) - LEFT LEG DEBRIDEMENT and TISSUE GRAFT   Anesthesia type: Choice   Diagnosis: Wound of left leg, subsequent encounter [S81.802D]   Pre-op diagnosis: chronic left leg wound   [facility]   Surgeons: Harden Jerona GAILS, MD       DISCUSSION: Patient is a 68 year old male scheduled for the above procedure. He is s/p revision right THA 11/12/23 for recurrent dislocation.  History includes never smoker, HTN, MVP with severe MR (s/p minimally invasive MV Repair with complex valvuloplasty with posterior leaflet triangular resection and Gortex neochord placement x4, 36 mm annuloplasty ring & MAZE/LAA clipping 03/21/19), chronic systolic CHF (normal coronaries 03/16/19; EF 25% 08/2022, 55% 01/2024), afib/flutter (diagnosed ~11/2018, s/p DCCV 01/03/19, 06/23/19, 12/11/22; MAZE/LAA clipping 03/21/19; s/p ablation 07/15/23), seizure (single seizure ~ 30 years ago), osteoarthritis (right THA 01/09/11, revision 11/12/23 for recurrent dislocation; right TKA 01/19/14; left TKA 10/23/14), LLE compartment syndrome (due to fall with hematoma, s/p I&D with fasciotomy 09/22/21, s/p skin graft to LLE wound 2023), umbilical hernia (s/p repair 07/10/16).   Last visit with cardiologist Dr. Court was on 01/07/24. He was on GDMT for HFrEF. In SR following afib/flutter ablation 07/2023. On Coumadin . Follow-up TTE planned and done on 01/28/24 showing normalization of EF to 55% (previously 40-45%), no RWMA, mildly reduced RV systolic function, mild RVH, MV repaired with trivial MR. 12 month office follow-up planned.    Last warfarin 06/06/24.   Anesthesia team to evaluate on the day of surgery.    VS: Ht 6' 3 (1.905 m)   Wt 119.3 kg   BMI 32.87 kg/m  BP Readings from Last 3 Encounters:  01/07/24 128/83  12/30/23 112/70  11/13/23 110/74   Pulse Readings from Last 3 Encounters:   01/07/24 66  12/30/23 75  11/13/23 65    PROVIDERS: Pura Lenis, MD is PCP  Court Carrier, MD is cardiologist Rolan Barrack, MD is HF cardiologist Mealor, Eulas, MD is EP cardiologist    LABS: Last labs in [facility] include: Lab Results  Component Value Date   WBC 10.7 (H) 11/13/2023   HGB 12.6 (L) 11/13/2023   HCT 39.2 11/13/2023   PLT 175 11/13/2023   GLUCOSE 125 (H) 11/13/2023   ALT 33 01/01/2023   AST 23 01/01/2023   NA 137 11/13/2023   K 4.2 11/13/2023   CL 102 11/13/2023   CREATININE 0.89 11/13/2023   BUN 17 11/13/2023   CO2 26 11/13/2023   TSH 2.335 01/01/2023   INR 2.8 05/31/2024     IMAGES: Xray pelvis/hips 04/20/24 X-rays of the pelvis shows a well-seated right total hip arthroplasty with an intact constrained liner.   1V PCXR 10/17/23: MPRESSION: 1. No acute cardiopulmonary abnormality or acute traumatic injury identified. 2. Cardiac valve replacement since 2020.   EKG: 12/30/23:  Normal sinus rhythm Left axis deviation Left anterior hemiblock Right bundle branch block Abnormal ECG When compared with ECG of 29-Oct-2023 17:20, PREVIOUS ECG IS PRESENT Confirmed by Claudene Pacific ([id number]) on 12/31/2023 10:46:39 AM   CV: Echo 01/28/24:  IMPRESSIONS   1. Left ventricular ejection fraction, by estimation, is 55%. The left  ventricle has normal function. The left ventricle has no regional wall  motion abnormalities. There is mild concentric left ventricular  hypertrophy. Left ventricular diastolic  function could not be evaluated.   2. Right ventricular systolic function  is mildly reduced most notably at  apex. The right ventricular size is mildly enlarged. Tricuspid  regurgitation signal is inadequate for assessing PA pressure.   3. Left atrial size was moderately dilated.   4. The mitral valve has been repaired/replaced. Trivial mitral valve  regurgitation. No evidence of mitral stenosis. The mean mitral valve  gradient is 3.0 mmHg with  average heart rate of 67 bpm. There is a 36mm 4D  Mitral Memo prosthetic annuloplasty ring   present in the mitral position. Procedure Date: 03/21/19. Echo findings  are consistent with normal structure and function of the mitral valve  prosthesis.   5. The aortic valve is tricuspid. There is mild calcification of the  aortic valve. Aortic valve regurgitation is not visualized. No aortic  stenosis is present.   6. Ascending aorta measurements are within normal limits for age when  indexed to body surface area.   7. The inferior vena cava is normal in size with greater than 50%  respiratory variability, suggesting right atrial pressure of 3 mmHg.  - Comparison 12/11/22: LVEF 40-45%, LV global hypokinesis, mildly reduced RVSF, MV annuloplasty ring with mild MR, no ASD or PFO by color doppler; 08/07/22 EF 25%; 08/08/21 EF 45-50%; 55-60% 05/26/19.   Cardiac cath 03/16/19: 1.  Widely patent, angiographically normal coronary arteries 2.  Normal LVEDP Recommend: Continued preoperative evaluation for mitral valve repair next week     Carotid US  03/16/19: Summary: Right Carotid: Velocities in the right ICA are consistent with a 1-39% stenosis. Left Carotid: Velocities in the left ICA are consistent with a 1-39% stenosis. Vertebrals: Bilateral vertebral arteries demonstrate antegrade flow.   Past Medical History:  Diagnosis Date   Acute on chronic systolic (congestive) heart failure (HCC) 12/30/2018   Allergy    Anxiety    Arthritis    Atrial flutter with rapid ventricular response (HCC)    Chronic systolic (congestive) heart failure (HCC)    COVID 10/2021   mild   Dilated aortic root (HCC) 01/04/2019   Dilated cardiomyopathy (HCC) 01/04/2019   Dysrhythmia    Heart murmur    History of blood transfusion    01/27/22   History of colon polyps    Hypertension    Insomnia    Mitral valve prolapse    Mitral valve regurgitation    Persistent atrial fibrillation (HCC) 12/02/2018   S/P Maze  operation for atrial fibrillation 03/21/2019   Complete bilateral atrial lesion set using cryothermy and bipolar radiofrequency ablation with clipping of LA appendage via right mini thoracotomy approach   S/P minimally invasive mitral valve repair 03/21/2019   Complex valvuloplasty including triangular resection of posterior leaflet, artificial Gore-tex neochord placement x4 and 36 mm Sorin Memo 4D ring annuloplasty via right mini thoracotomy approach   Seizures (HCC)    had one approx. 30 yrs ago,has not had any since; due to alchol and no sleep   Severe mitral regurgitation     Past Surgical History:  Procedure Laterality Date   ANKLE FUSION  left   Dec. 2012   ANTERIOR HIP REVISION Right 11/12/2023   Procedure: REVISION OF RIGHT TOTAL HIP ARTHROPLASTY;  Surgeon: Vernetta Lonni GRADE, MD;  Location: [facility];  Service: Orthopedics;  Laterality: Right;   APPLICATION OF WOUND VAC Left 09/22/2021   Procedure: APPLICATION OF WOUND VAC;  Surgeon: Vernetta Lonni GRADE, MD;  [facility];  Service: Orthopedics;  Laterality: Left;   APPLICATION OF WOUND VAC Left 12/30/2021   Procedure: APPLICATION OF WOUND  VAC;  Surgeon: Vernetta Lonni GRADE, MD;  Location: [facility] OR;  Service: Orthopedics;  Laterality: Left;   APPLICATION OF WOUND VAC Left 04/10/2022   Procedure: APPLICATION OF WOUND VAC;  Surgeon: Harden Jerona GAILS, MD;  [facility];  Service: Orthopedics;  Laterality: Left;   ARTHROSCOPY KNEE W/ DRILLING  bilateral   2012   ATRIAL FIBRILLATION ABLATION N/A 07/15/2023   Procedure: ATRIAL FIBRILLATION ABLATION;  Surgeon: Nancey Eulas BRAVO, MD;  [facility];  Service: Cardiovascular;  Laterality: N/A;   BIOPSY  01/30/2022   Procedure: BIOPSY;  Surgeon: Wilhelmenia Aloha Raddle., MD;  Location: [facility];  Service: Gastroenterology;;   CARDIAC VALVE REPLACEMENT     CARDIOVERSION N/A 01/03/2019   Procedure: CARDIOVERSION;  Surgeon: Shlomo Wilbert SAUNDERS, MD;  Location: [facility] END[facility]COPY;   Service: Cardiovascular;  Laterality: N/A;   CARDIOVERSION N/A 06/23/2019   Procedure: CARDIOVERSION;  Surgeon: Okey Vina GAILS, MD;  Location: [facility] ENDOSCOPY;  Service: Cardiovascular;  Laterality: N/A;   CARDIOVERSION N/A 12/11/2022   Procedure: CARDIOVERSION;  Surgeon: Rolan Ezra RAMAN, MD;  Location: [facility] ENDOSCOPY;  Service: Cardiovascular;  Laterality: N/A;   CLIPPING OF ATRIAL APPENDAGE  03/21/2019   Procedure: Clipping Of Atrial Appendage using 45mm Atricure Pro2 Clip;  Surgeon: Dusty Sudie DEL, MD;  Location: [facility] OR;  Service: Open Heart Surgery;;   COLONOSCOPY     x2   COLONOSCOPY N/A 01/30/2022   Procedure: COLONOSCOPY;  Surgeon: Wilhelmenia Aloha Raddle., MD;  Location: [facility];  Service: Gastroenterology;  Laterality: N/A;   ESOPHAGOGASTRODUODENOSCOPY N/A 01/30/2022   Procedure: ESOPHAGOGASTRODUODENOSCOPY (EGD);  Surgeon: Wilhelmenia Aloha Raddle., MD;  Location: [facility];  Service: Gastroenterology;  Laterality: N/A;   FOOT ARTHRODESIS  06/23/2012   Procedure: ARTHRODESIS FOOT;  Surgeon: Norleen Armor, MD;  Location: [facility] OR;  Service: Orthopedics;  Laterality: Left;  Left Subtalar and Talonavicular Joint Revision Arthrodesis  Aspiration of Bone Marrow from Left Hip    HAIR TRANSPLANT     HARDWARE REMOVAL  06/23/2012   Procedure: HARDWARE REMOVAL;  Surgeon: Norleen Armor, MD;  Location: [facility] OR;  Service: Orthopedics;  Laterality: Left;  Removal of Deep Implant  X's 3   I & D EXTREMITY Left 09/23/2021   Procedure: IRRIGATION AND DEBRIDEMENT OF LEG;  Surgeon: Vernetta Lonni GRADE, MD;  [facility];  Service: Orthopedics;  Laterality: Left;   I & D EXTREMITY Left 09/26/2021   Procedure: REPEAT IRRIGATION AND DEBRIDEMENT LEFT LEG, POSSIBLE WOUND CLOSURE, POSSIBLE VAC CHANGE, POSSIBLE SKIN GRAFT;  Surgeon: Vernetta Lonni GRADE, MD;  [facility];  Service: Orthopedics;  Laterality: Left;   I & D EXTREMITY Left 12/30/2021   Procedure: IRRIGATION AND DEBRIDEMENT LEFT LOWER LEG WOUND;   Surgeon: Vernetta Lonni GRADE, MD;  [facility];  Service: Orthopedics;  Laterality: Left;   I & D EXTREMITY Left 01/02/2022   Procedure: LEFT LEG DEBRIDEMENT AND TISSUE GRAFT;  Surgeon: Harden Jerona GAILS, MD;  Location: [facility] OR;  Service: Orthopedics;  Laterality: Left;   INCISION AND DRAINAGE WOUND WITH FASCIOTOMY Left 09/22/2021   Procedure: INCISION AND DRAINAGE WOUND WITH FASCIOTOMY;  Surgeon: Vernetta Lonni GRADE, MD;  [facility];  Service: Orthopedics;  Laterality: Left;   INSERTION OF MESH N/A 07/10/2016   Procedure: INSERTION OF MESH;  Surgeon: Vicenta Vernetta, MD;  Location: [facility];  Service: General;  Laterality: N/A;   JOINT REPLACEMENT     right hip  01-2011   LEFT HEART CATH AND CORONARY ANGIOGRAPHY N/A  03/16/2019   Procedure: LEFT HEART CATH AND CORONARY ANGIOGRAPHY;  Surgeon: Wonda Sharper, MD;  Location: [facility] INVASIVE CV LAB;  Service: Cardiovascular;  Laterality: N/A;   LIMB SPARING RESECTION HIP W/ SADDLE JOINT REPLACEMENT Right    MINIMALLY INVASIVE MAZE PROCEDURE N/A 03/21/2019   Procedure: MINIMALLY INVASIVE MAZE PROCEDURE;  Surgeon: Dusty Sudie DEL, MD;  Location: [facility] OR;  Service: Open Heart Surgery;  Laterality: N/A;   MITRAL VALVE REPAIR Right 03/21/2019   Procedure: MINIMALLY INVASIVE MITRAL VALVE REPAIR (MVR) using Memo 4D ring size 36;  Surgeon: Dusty Sudie DEL, MD;  [facility];  Service: Open Heart Surgery;  Laterality: Right;   POLYPECTOMY  01/30/2022   Procedure: POLYPECTOMY;  Surgeon: Mansouraty, Aloha Raddle., MD;  Location: [facility];  Service: Gastroenterology;;   SKIN SPLIT GRAFT Left 04/10/2022   Procedure: APPLY SKIN GRAFT TO LEFT LEG WOUND;  Surgeon: Harden Jerona GAILS, MD;  Location: [facility] OR;  Service: Orthopedics;  Laterality: Left;   TEE WITHOUT CARDIOVERSION N/A 01/03/2019   Procedure: TRANSESOPHAGEAL ECHOCARDIOGRAM (TEE);  Surgeon: Shlomo Wilbert SAUNDERS, MD;  Location: [facility] ENDOSCOPY;  Service: Cardiovascular;  Laterality: N/A;   TEE  WITHOUT CARDIOVERSION N/A 03/21/2019   Procedure: TRANSESOPHAGEAL ECHOCARDIOGRAM (TEE);  Surgeon: Dusty Sudie DEL, MD;  Location: [facility] OR;  Service: Open Heart Surgery;  Laterality: N/A;   TEE WITHOUT CARDIOVERSION N/A 12/11/2022   Procedure: TRANSESOPHAGEAL ECHOCARDIOGRAM (TEE);  Surgeon: Rolan Ezra RAMAN, MD;  Location: [facility] ENDOSCOPY;  Service: Cardiovascular;  Laterality: N/A;   TEMPORARY PACEMAKER N/A 03/22/2019   Procedure: TEMPORARY PACEMAKER;  Surgeon: Claudene Victory ORN, MD;  Location: [facility] INVASIVE CV LAB;  Service: Cardiovascular;  Laterality: N/A;   TOTAL KNEE ARTHROPLASTY Right 01/19/2014   Procedure: RIGHT TOTAL KNEE ARTHROPLASTY, Steroid injection left knee;  Surgeon: [name], MD;  Location: [facility];  Service: Orthopedics;  Laterality: Right;   TOTAL KNEE ARTHROPLASTY Left 10/23/2014   Procedure: LEFT TOTAL KNEE ARTHROPLASTY;  Surgeon: [name], MD;  Location: [facility];  Service: Orthopedics;  Laterality: Left;   UMBILICAL HERNIA REPAIR N/A 07/10/2016   Procedure: UMBILICAL HERNIA REPAIR;  Surgeon: Vicenta Poli, MD;  Location: [facility];  Service: General;  Laterality: N/A;    MEDICATIONS: No current facility-administered medications for this encounter.    acetaminophen  (TYLENOL ) 500 MG tablet   ALPRAZolam  (XANAX ) 0.5 MG tablet   atorvastatin  (LIPITOR) 20 MG tablet   bismuth  subsalicylate (PEPTO BISMOL) 262 MG/15ML suspension   Docusate Sodium  (DSS) 100 MG CAPS   meclizine  (ANTIVERT ) 25 MG tablet   Melatonin 10 MG TABS   metoprolol  succinate (TOPROL -XL) 25 MG 24 hr tablet   Multiple Vitamin (MULTIVITAMIN WITH MINERALS) TABS tablet   oxyCODONE  (ROXICODONE ) 15 MG immediate release tablet   sacubitril -valsartan  (ENTRESTO ) 49-51 MG   spironolactone  (ALDACTONE ) 25 MG tablet   torsemide  (DEMADEX ) 20 MG tablet   warfarin (COUMADIN ) 5 MG tablet   silver  sulfADIAZINE  (SILVADENE ) 1 % cream    Isaiah Ruder, PA-C Surgical Short  Stay/Anesthesiology [facility] Phone [phone] [facility] Phone [phone] 06/08/2024 12:39 PM

## 2024-06-08 NOTE — Anesthesia Preprocedure Evaluation (Signed)
 Anesthesia Evaluation  Patient identified by MRN, date of birth, ID band Patient awake    Reviewed: Allergy & Precautions, NPO status , Patient's Chart, lab work & pertinent test results  Airway Mallampati: III  TM Distance: >3 FB Neck ROM: Full    Dental  (+) Teeth Intact, Dental Advisory Given   Pulmonary    breath sounds clear to auscultation       Cardiovascular hypertension, Pt. on home beta blockers and Pt. on medications +CHF  + dysrhythmias + Valvular Problems/Murmurs  Rhythm:Regular Rate:Normal  Echo 01/28/24:  IMPRESSIONS   1. Left ventricular ejection fraction, by estimation, is 55%. The left  ventricle has normal function. The left ventricle has no regional wall  motion abnormalities. There is mild concentric left ventricular  hypertrophy. Left ventricular diastolic  function could not be evaluated.   2. Right ventricular systolic function is mildly reduced most notably at  apex. The right ventricular size is mildly enlarged. Tricuspid  regurgitation signal is inadequate for assessing PA pressure.   3. Left atrial size was moderately dilated.   4. The mitral valve has been repaired/replaced. Trivial mitral valve  regurgitation. No evidence of mitral stenosis. The mean mitral valve  gradient is 3.0 mmHg with average heart rate of 67 bpm. There is a 36mm 4D  Mitral Memo prosthetic annuloplasty ring   present in the mitral position. Procedure Date: 03/21/19. Echo findings  are consistent with normal structure and function of the mitral valve  prosthesis.   5. The aortic valve is tricuspid. There is mild calcification of the  aortic valve. Aortic valve regurgitation is not visualized. No aortic  stenosis is present.   6. Ascending aorta measurements are within normal limits for age when  indexed to body surface area.   7. The inferior vena cava is normal in size with greater than 50%  respiratory variability,  suggesting right atrial pressure of 3 mmHg.     Neuro/Psych Seizures -,  PSYCHIATRIC DISORDERS Anxiety        GI/Hepatic negative GI ROS, Neg liver ROS,,,  Endo/Other  negative endocrine ROS    Renal/GU Renal disease     Musculoskeletal  (+) Arthritis ,    Abdominal   Peds  Hematology  (+) Blood dyscrasia, anemia   Anesthesia Other Findings   Reproductive/Obstetrics                              Anesthesia Physical Anesthesia Plan  ASA: 3  Anesthesia Plan: General   Post-op Pain Management: Toradol  IV (intra-op)* and Tylenol  PO (pre-op)*   Induction: Intravenous  PONV Risk Score and Plan: 3 and Ondansetron , Dexamethasone , Midazolam  and Treatment may vary due to age or medical condition  Airway Management Planned: LMA  Additional Equipment: None  Intra-op Plan:   Post-operative Plan: Extubation in OR  Informed Consent: I have reviewed the patients History and Physical, chart, labs and discussed the procedure including the risks, benefits and alternatives for the proposed anesthesia with the patient or authorized representative who has indicated his/her understanding and acceptance.     Dental advisory given  Plan Discussed with: CRNA  Anesthesia Plan Comments: (PAT note written 06/08/2024 by Maxxon Schwanke, PA-C.  )         Anesthesia Quick Evaluation

## 2024-06-09 ENCOUNTER — Ambulatory Visit (HOSPITAL_COMMUNITY): Payer: Self-pay | Admitting: Vascular Surgery

## 2024-06-09 ENCOUNTER — Other Ambulatory Visit: Payer: Self-pay

## 2024-06-09 ENCOUNTER — Encounter (HOSPITAL_COMMUNITY)
Admission: RE | Disposition: A | Discharge status: Home / Self Care | Payer: Self-pay | Source: Home / Self Care | Attending: Orthopedic Surgery

## 2024-06-09 ENCOUNTER — Encounter (HOSPITAL_COMMUNITY): Payer: Self-pay | Admitting: Vascular Surgery

## 2024-06-09 ENCOUNTER — Encounter (HOSPITAL_COMMUNITY): Payer: Self-pay | Admitting: Orthopedic Surgery

## 2024-06-09 ENCOUNTER — Ambulatory Visit (HOSPITAL_COMMUNITY)
Admission: RE | Admit: 2024-06-09 | Discharge: 2024-06-09 | Disposition: A | Discharge status: Home / Self Care | Attending: Orthopedic Surgery | Admitting: Orthopedic Surgery

## 2024-06-09 DIAGNOSIS — I110 Hypertensive heart disease with heart failure: Secondary | ICD-10-CM

## 2024-06-09 DIAGNOSIS — I5022 Chronic systolic (congestive) heart failure: Secondary | ICD-10-CM | POA: Insufficient documentation

## 2024-06-09 DIAGNOSIS — S81802A Unspecified open wound, left lower leg, initial encounter: Secondary | ICD-10-CM | POA: Insufficient documentation

## 2024-06-09 DIAGNOSIS — T79A22S Traumatic compartment syndrome of left lower extremity, sequela: Secondary | ICD-10-CM | POA: Diagnosis not present

## 2024-06-09 DIAGNOSIS — S81802D Unspecified open wound, left lower leg, subsequent encounter: Secondary | ICD-10-CM | POA: Diagnosis not present

## 2024-06-09 DIAGNOSIS — I5023 Acute on chronic systolic (congestive) heart failure: Secondary | ICD-10-CM | POA: Diagnosis not present

## 2024-06-09 DIAGNOSIS — I4891 Unspecified atrial fibrillation: Secondary | ICD-10-CM | POA: Diagnosis not present

## 2024-06-09 DIAGNOSIS — X58XXXA Exposure to other specified factors, initial encounter: Secondary | ICD-10-CM | POA: Insufficient documentation

## 2024-06-09 DIAGNOSIS — A4901 Methicillin susceptible Staphylococcus aureus infection, unspecified site: Secondary | ICD-10-CM | POA: Insufficient documentation

## 2024-06-09 DIAGNOSIS — S8011XA Contusion of right lower leg, initial encounter: Secondary | ICD-10-CM

## 2024-06-09 DIAGNOSIS — Z7901 Long term (current) use of anticoagulants: Secondary | ICD-10-CM | POA: Diagnosis not present

## 2024-06-09 HISTORY — PX: INCISION AND DRAINAGE OF WOUND: SHX1803

## 2024-06-09 HISTORY — PX: APPLICATION OF WOUND VAC: SHX5189

## 2024-06-09 LAB — CBC
HCT: 44.4 % (ref 39.0–52.0)
Hemoglobin: 14.4 g/dL (ref 13.0–17.0)
MCH: 31.9 pg (ref 26.0–34.0)
MCHC: 32.4 g/dL (ref 30.0–36.0)
MCV: 98.2 fL (ref 80.0–100.0)
Platelets: 164 K/uL (ref 150–400)
RBC: 4.52 MIL/uL (ref 4.22–5.81)
RDW: 14.2 % (ref 11.5–15.5)
WBC: 6.3 K/uL (ref 4.0–10.5)
nRBC: 0 % (ref 0.0–0.2)

## 2024-06-09 LAB — POCT I-STAT, CHEM 8
BUN: 25 mg/dL — ABNORMAL HIGH (ref 8–23)
Calcium, Ion: 1.07 mmol/L — ABNORMAL LOW (ref 1.15–1.40)
Chloride: 98 mmol/L (ref 98–111)
Creatinine, Ser: 1.1 mg/dL (ref 0.61–1.24)
Glucose, Bld: 100 mg/dL — ABNORMAL HIGH (ref 70–99)
HCT: 44 % (ref 39.0–52.0)
Hemoglobin: 15 g/dL (ref 13.0–17.0)
Potassium: 4 mmol/L (ref 3.5–5.1)
Sodium: 136 mmol/L (ref 135–145)
TCO2: 25 mmol/L (ref 22–32)

## 2024-06-09 LAB — PROTIME-INR
INR: 1.7 — ABNORMAL HIGH (ref 0.8–1.2)
Prothrombin Time: 20.5 s — ABNORMAL HIGH (ref 11.4–15.2)

## 2024-06-09 SURGERY — IRRIGATION AND DEBRIDEMENT WOUND
Anesthesia: General | Site: Leg Lower | Laterality: Left

## 2024-06-09 MED ORDER — FENTANYL CITRATE (PF) 250 MCG/5ML IJ SOLN
INTRAMUSCULAR | Status: AC
  Filled 2024-06-09: qty 5

## 2024-06-09 MED ORDER — KETOROLAC TROMETHAMINE 30 MG/ML IJ SOLN
INTRAMUSCULAR | Status: AC
  Filled 2024-06-09: qty 1

## 2024-06-09 MED ORDER — DEXAMETHASONE SODIUM PHOSPHATE 10 MG/ML IJ SOLN
INTRAMUSCULAR | Status: AC
Start: 2024-06-09 — End: 2024-06-09
  Filled 2024-06-09: qty 1

## 2024-06-09 MED ORDER — PROPOFOL 10 MG/ML IV BOLUS
INTRAVENOUS | Status: AC
  Filled 2024-06-09: qty 20

## 2024-06-09 MED ORDER — CHLORHEXIDINE GLUCONATE 0.12 % MT SOLN
15.0000 mL | Freq: Once | OROMUCOSAL | Status: AC
  Administered 2024-06-09: 15 mL via OROMUCOSAL
  Filled 2024-06-09: qty 15

## 2024-06-09 MED ORDER — LACTATED RINGERS IV SOLN: INTRAVENOUS | Status: DC

## 2024-06-09 MED ORDER — MIDAZOLAM HCL 2 MG/2ML IJ SOLN
INTRAMUSCULAR | Status: DC | PRN
  Administered 2024-06-09: 2 mg via INTRAVENOUS

## 2024-06-09 MED ORDER — OXYCODONE HCL 5 MG PO TABS: 5.0000 mg | ORAL_TABLET | Freq: Once | ORAL | Status: DC | PRN

## 2024-06-09 MED ORDER — FENTANYL CITRATE (PF) 100 MCG/2ML IJ SOLN
INTRAMUSCULAR | Status: AC
  Filled 2024-06-09: qty 2

## 2024-06-09 MED ORDER — FENTANYL CITRATE (PF) 250 MCG/5ML IJ SOLN
INTRAMUSCULAR | Status: DC | PRN
  Administered 2024-06-09: 50 ug via INTRAVENOUS
  Administered 2024-06-09: 100 ug via INTRAVENOUS

## 2024-06-09 MED ORDER — LIDOCAINE 2% (20 MG/ML) 5 ML SYRINGE
INTRAMUSCULAR | Status: AC
Start: 2024-06-09 — End: 2024-06-09
  Filled 2024-06-09: qty 5

## 2024-06-09 MED ORDER — ONDANSETRON HCL 4 MG/2ML IJ SOLN
INTRAMUSCULAR | Status: DC | PRN
  Administered 2024-06-09: 4 mg via INTRAVENOUS

## 2024-06-09 MED ORDER — FENTANYL CITRATE (PF) 100 MCG/2ML IJ SOLN
25.0000 ug | INTRAMUSCULAR | Status: DC | PRN
  Administered 2024-06-09: 50 ug via INTRAVENOUS

## 2024-06-09 MED ORDER — 0.9 % SODIUM CHLORIDE (POUR BTL) OPTIME
TOPICAL | Status: DC | PRN
  Administered 2024-06-09: 1000 mL

## 2024-06-09 MED ORDER — OXYCODONE HCL 5 MG/5ML PO SOLN: 5.0000 mg | Freq: Once | ORAL | Status: DC | PRN

## 2024-06-09 MED ORDER — CEFAZOLIN SODIUM-DEXTROSE 2-4 GM/100ML-% IV SOLN
2.0000 g | INTRAVENOUS | Status: AC
  Administered 2024-06-09: 2 g via INTRAVENOUS
  Filled 2024-06-09: qty 100

## 2024-06-09 MED ORDER — LIDOCAINE 2% (20 MG/ML) 5 ML SYRINGE
INTRAMUSCULAR | Status: DC | PRN
  Administered 2024-06-09: 60 mg via INTRAVENOUS

## 2024-06-09 MED ORDER — KETOROLAC TROMETHAMINE 30 MG/ML IJ SOLN
INTRAMUSCULAR | Status: DC | PRN
Start: 2024-06-09 — End: 2024-06-09
  Administered 2024-06-09: 30 mg via INTRAVENOUS

## 2024-06-09 MED ORDER — PROPOFOL 10 MG/ML IV BOLUS
INTRAVENOUS | Status: DC | PRN
  Administered 2024-06-09: 200 mg via INTRAVENOUS

## 2024-06-09 MED ORDER — VASHE WOUND IRRIGATION OPTIME
TOPICAL | Status: DC | PRN
Start: 2024-06-09 — End: 2024-06-09
  Administered 2024-06-09: 34 [oz_av]

## 2024-06-09 MED ORDER — DEXAMETHASONE SODIUM PHOSPHATE 10 MG/ML IJ SOLN
INTRAMUSCULAR | Status: DC | PRN
  Administered 2024-06-09: 10 mg via INTRAVENOUS

## 2024-06-09 MED ORDER — MIDAZOLAM HCL 2 MG/2ML IJ SOLN
INTRAMUSCULAR | Status: AC
  Filled 2024-06-09: qty 2

## 2024-06-09 MED ORDER — DROPERIDOL 2.5 MG/ML IJ SOLN: 0.6250 mg | Freq: Once | INTRAMUSCULAR | Status: DC | PRN

## 2024-06-09 MED ORDER — ORAL CARE MOUTH RINSE: 15.0000 mL | Freq: Once | OROMUCOSAL | Status: AC

## 2024-06-09 MED ORDER — EPHEDRINE SULFATE-NACL 50-0.9 MG/10ML-% IV SOSY
PREFILLED_SYRINGE | INTRAVENOUS | Status: DC | PRN
  Administered 2024-06-09: 5 mg via INTRAVENOUS

## 2024-06-09 MED ORDER — ACETAMINOPHEN 10 MG/ML IV SOLN: 1000.0000 mg | Freq: Once | INTRAVENOUS | Status: DC | PRN

## 2024-06-09 MED ORDER — ONDANSETRON HCL 4 MG/2ML IJ SOLN
INTRAMUSCULAR | Status: AC
  Filled 2024-06-09: qty 2

## 2024-06-09 MED ORDER — SODIUM CHLORIDE (PF) 0.9 % IJ SOLN
INTRAMUSCULAR | Status: AC
  Filled 2024-06-09: qty 10

## 2024-06-09 SURGICAL SUPPLY — 40 items
BAG COUNTER SPONGE SURGICOUNT (BAG) IMPLANT
BLADE SURG 21 STRL SS (BLADE) ×2 IMPLANT
BNDG COHESIVE 4X5 TAN STRL LF (GAUZE/BANDAGES/DRESSINGS) IMPLANT
BNDG COHESIVE 6X5 TAN NS LF (GAUZE/BANDAGES/DRESSINGS) IMPLANT
BNDG COHESIVE 6X5 TAN ST LF (GAUZE/BANDAGES/DRESSINGS) IMPLANT
BNDG GAUZE DERMACEA FLUFF 4 (GAUZE/BANDAGES/DRESSINGS) IMPLANT
CANISTER WOUNDNEG PRESSURE 500 (CANNISTER) IMPLANT
CLEANSER WND VASHE 34 (WOUND CARE) IMPLANT
CLEANSER WND VASHE INSTL 34OZ (WOUND CARE) IMPLANT
COVER SURGICAL LIGHT HANDLE (MISCELLANEOUS) ×4 IMPLANT
DRAPE DERMATAC (DRAPES) IMPLANT
DRAPE U-SHAPE 47X51 STRL (DRAPES) ×2 IMPLANT
DRESSING PEEL AND PLAC PRVNA20 (GAUZE/BANDAGES/DRESSINGS) IMPLANT
DRESSING PREVENA PLUS CUSTOM (GAUZE/BANDAGES/DRESSINGS) IMPLANT
DRESSING VERAFLO CLEANS CC MED (GAUZE/BANDAGES/DRESSINGS) IMPLANT
DRSG ADAPTIC 3X8 NADH LF (GAUZE/BANDAGES/DRESSINGS) ×2 IMPLANT
DURAPREP 26ML APPLICATOR (WOUND CARE) ×2 IMPLANT
ELECTRODE REM PT RTRN 9FT ADLT (ELECTROSURGICAL) IMPLANT
GAUZE PAD ABD 8X10 STRL (GAUZE/BANDAGES/DRESSINGS) IMPLANT
GAUZE SPONGE 4X4 12PLY STRL (GAUZE/BANDAGES/DRESSINGS) IMPLANT
GAUZE SPONGE 4X4 12PLY STRL LF (GAUZE/BANDAGES/DRESSINGS) IMPLANT
GLOVE BIOGEL PI IND STRL 9 (GLOVE) ×2 IMPLANT
GLOVE SURG ORTHO 9.0 STRL STRW (GLOVE) ×2 IMPLANT
GOWN STRL REUS W/ TWL XL LVL3 (GOWN DISPOSABLE) ×4 IMPLANT
GRAFT SKIN WND MARIGEN OMEGA3 (Tissue) IMPLANT
KIT BASIN OR (CUSTOM PROCEDURE TRAY) ×2 IMPLANT
KIT TURNOVER KIT B (KITS) ×2 IMPLANT
MANIFOLD NEPTUNE II (INSTRUMENTS) ×2 IMPLANT
NS IRRIG 1000ML POUR BTL (IV SOLUTION) ×2 IMPLANT
PACK ORTHO EXTREMITY (CUSTOM PROCEDURE TRAY) ×2 IMPLANT
PAD ARMBOARD POSITIONER FOAM (MISCELLANEOUS) ×4 IMPLANT
PAD NEG PRESSURE SENSATRAC (MISCELLANEOUS) IMPLANT
SET HNDPC FAN SPRY TIP SCT (DISPOSABLE) IMPLANT
STOCKINETTE IMPERVIOUS 9X36 MD (GAUZE/BANDAGES/DRESSINGS) IMPLANT
SUT ETHILON 2 0 PSLX (SUTURE) ×2 IMPLANT
SWAB COLLECTION DEVICE MRSA (MISCELLANEOUS) ×2 IMPLANT
SWAB CULTURE ESWAB REG 1ML (MISCELLANEOUS) IMPLANT
TOWEL GREEN STERILE (TOWEL DISPOSABLE) ×2 IMPLANT
TUBE CONNECTING 12X1/4 (SUCTIONS) ×2 IMPLANT
YANKAUER SUCT BULB TIP NO VENT (SUCTIONS) ×2 IMPLANT

## 2024-06-09 NOTE — H&P (Signed)
 [name] is an 68 y.o. male.   Chief Complaint: Chronic ulcer left leg HPI: Patient is a 68 year old gentleman who presents with a chronic wound to the lateral aspect of the left leg. The healing has stalled. Patient has tried different wound treatment options and has completed a course of doxycycline . The ulcer has been present for 2 years and 4 months.   Past Medical History:  Diagnosis Date   Acute on chronic systolic (congestive) heart failure (HCC) 12/30/2018   Allergy    Anxiety    Arthritis    Atrial flutter with rapid ventricular response (HCC)    Chronic systolic (congestive) heart failure (HCC)    COVID 10/2021   mild   Dilated aortic root (HCC) 01/04/2019   Dilated cardiomyopathy (HCC) 01/04/2019   Dysrhythmia    Heart murmur    History of blood transfusion    01/27/22   History of colon polyps    Hypertension    Insomnia    Mitral valve prolapse    Mitral valve regurgitation    Persistent atrial fibrillation (HCC) 12/02/2018   S/P Maze operation for atrial fibrillation 03/21/2019   Complete bilateral atrial lesion set using cryothermy and bipolar radiofrequency ablation with clipping of LA appendage via right mini thoracotomy approach   S/P minimally invasive mitral valve repair 03/21/2019   Complex valvuloplasty including triangular resection of posterior leaflet, artificial Gore-tex neochord placement x4 and 36 mm Sorin Memo 4D ring annuloplasty via right mini thoracotomy approach   Seizures (HCC)    had one approx. 30 yrs ago,has not had any since; due to alchol and no sleep   Severe mitral regurgitation     Past Surgical History:  Procedure Laterality Date   ANKLE FUSION  left   Dec. 2012   ANTERIOR HIP REVISION Right 11/12/2023   Procedure: REVISION OF RIGHT TOTAL HIP ARTHROPLASTY;  Surgeon: Vernetta Lonni GRADE, MD;  Location: [facility];  Service: Orthopedics;  Laterality: Right;   APPLICATION OF WOUND VAC Left 09/22/2021   Procedure: APPLICATION OF  WOUND VAC;  Surgeon: Vernetta Lonni GRADE, MD;  Location: [facility];  Service: Orthopedics;  Laterality: Left;   APPLICATION OF WOUND VAC Left 12/30/2021   Procedure: APPLICATION OF WOUND VAC;  Surgeon: Vernetta Lonni GRADE, MD;  Location: [facility];  Service: Orthopedics;  Laterality: Left;   APPLICATION OF WOUND VAC Left 04/10/2022   Procedure: APPLICATION OF WOUND VAC;  Surgeon: Harden Jerona GAILS, MD;  Location: [facility];  Service: Orthopedics;  Laterality: Left;   ARTHROSCOPY KNEE W/ DRILLING  bilateral   2012   ATRIAL FIBRILLATION ABLATION N/A 07/15/2023   Procedure: ATRIAL FIBRILLATION ABLATION;  Surgeon: Nancey Eulas BRAVO, MD;  [facility]: [facility];  Service: Cardiovascular;  Laterality: N/A;   BIOPSY  01/30/2022   Procedure: BIOPSY;  Surgeon: Wilhelmenia Aloha Raddle., MD;  Location: [facility];  Service: Gastroenterology;;   CARDIAC VALVE REPLACEMENT     CARDIOVERSION N/A 01/03/2019   Procedure: CARDIOVERSION;  Surgeon: Shlomo Wilbert SAUNDERS, MD;  Location: [facility] END[facility]COPY;  Service: Cardiovascular;  Laterality: N/A;   CARDIOVERSION N/A 06/23/2019   Procedure: CARDIOVERSION;  Surgeon: Okey Vina GAILS, MD;  Location: [facility] ENDOSCOPY;  Service: Cardiovascular;  Laterality: N/A;   CARDIOVERSION N/A 12/11/2022   Procedure: CARDIOVERSION;  Surgeon: Rolan Ezra RAMAN, MD;  Location: [facility] ENDOSCOPY;  Service: Cardiovascular;  Laterality: N/A;   CLIPPING OF ATRIAL APPENDAGE  03/21/2019   Procedure: Clipping Of Atrial Appendage using 45mm Atricure Pro2 Clip;  Surgeon:  Dusty Sudie DEL, MD;  Location: [facility] OR;  Service: Open Heart Surgery;;   COLONOSCOPY     x2   COLONOSCOPY N/A 01/30/2022   Procedure: COLONOSCOPY;  Surgeon: Wilhelmenia Aloha Raddle., MD;  Location: [facility];  Service: Gastroenterology;  Laterality: N/A;   ESOPHAGOGASTRODUODENOSCOPY N/A 01/30/2022   Procedure: ESOPHAGOGASTRODUODENOSCOPY (EGD);  Surgeon: Wilhelmenia Aloha Raddle., MD;  Location: [facility];  Service: Gastroenterology;  Laterality: N/A;    FOOT ARTHRODESIS  06/23/2012   Procedure: ARTHRODESIS FOOT;  Surgeon: Norleen Armor, MD;  Location: [facility] OR;  Service: Orthopedics;  Laterality: Left;  Left Subtalar and Talonavicular Joint Revision Arthrodesis  Aspiration of Bone Marrow from Left Hip    HAIR TRANSPLANT     HARDWARE REMOVAL  06/23/2012   Procedure: HARDWARE REMOVAL;  Surgeon: Norleen Armor, MD;  Location: [facility] OR;  Service: Orthopedics;  Laterality: Left;  Removal of Deep Implant  X's 3   I & D EXTREMITY Left 09/23/2021   Procedure: IRRIGATION AND DEBRIDEMENT OF LEG;  Surgeon: Vernetta Lonni GRADE, MD;  [facility];  Service: Orthopedics;  Laterality: Left;   I & D EXTREMITY Left 09/26/2021   Procedure: REPEAT IRRIGATION AND DEBRIDEMENT LEFT LEG, POSSIBLE WOUND CLOSURE, POSSIBLE VAC CHANGE, POSSIBLE SKIN GRAFT;  Surgeon: Vernetta Lonni GRADE, MD;  [facility]: [facility];  Service: Orthopedics;  Laterality: Left;   I & D EXTREMITY Left 12/30/2021   Procedure: IRRIGATION AND DEBRIDEMENT LEFT LOWER LEG WOUND;  Surgeon: Vernetta Lonni GRADE, MD;  [facility];  Service: Orthopedics;  Laterality: Left;   I & D EXTREMITY Left 01/02/2022   Procedure: LEFT LEG DEBRIDEMENT AND TISSUE GRAFT;  Surgeon: Harden Jerona GAILS, MD;  Location: [facility] OR;  Service: Orthopedics;  Laterality: Left;   INCISION AND DRAINAGE WOUND WITH FASCIOTOMY Left 09/22/2021   Procedure: INCISION AND DRAINAGE WOUND WITH FASCIOTOMY;  Surgeon: Vernetta Lonni GRADE, MD;  [facility];  Service: Orthopedics;  Laterality: Left;   INSERTION OF MESH N/A 07/10/2016   Procedure: INSERTION OF MESH;  Surgeon: Vicenta Vernetta, MD;  Location: [facility];  Service: General;  Laterality: N/A;   JOINT REPLACEMENT     right hip  01-2011   LEFT HEART CATH AND CORONARY ANGIOGRAPHY N/A 03/16/2019   Procedure: LEFT HEART CATH AND CORONARY ANGIOGRAPHY;  Surgeon: Wonda Sharper, MD;  Location: [facility]VASIVE CV LAB;  Service: Cardiovascular;  Laterality: N/A;   LIMB SPARING  RESECTION HIP W/ SADDLE JOINT REPLACEMENT Right    MINIMALLY INVASIVE MAZE PROCEDURE N/A 03/21/2019   Procedure: MINIMALLY INVASIVE MAZE PROCEDURE;  Surgeon: Dusty Sudie DEL, MD;  Location: [facility] OR;  Service: Open Heart Surgery;  Laterality: N/A;   MITRAL VALVE REPAIR Right 03/21/2019   Procedure: MINIMALLY INVASIVE MITRAL VALVE REPAIR (MVR) using Memo 4D ring size 36;  Surgeon: Dusty Sudie DEL, MD;  [facility];  Service: Open Heart Surgery;  Laterality: Right;   POLYPECTOMY  01/30/2022   Procedure: POLYPECTOMY;  Surgeon: Mansouraty, Aloha Raddle., MD;  Location: [facility];  Service: Gastroenterology;;   SKIN SPLIT GRAFT Left 04/10/2022   Procedure: APPLY SKIN GRAFT TO LEFT LEG WOUND;  Surgeon: Harden Jerona GAILS, MD;  Location: [facility] OR;  Service: Orthopedics;  Laterality: Left;   TEE WITHOUT CARDIOVERSION N/A 01/03/2019   Procedure: TRANSESOPHAGEAL ECHOCARDIOGRAM (TEE);  Surgeon: Shlomo Wilbert SAUNDERS, MD;  Location: [facility] ENDOSCOPY;  Service: Cardiovascular;  Laterality: N/A;   TEE WITHOUT CARDIOVERSION N/A 03/21/2019   Procedure: TRANSESOPHAGEAL ECHOCARDIOGRAM (TEE);  Surgeon: Dusty Sudie DEL, MD;  [facility];  Service: Open Heart Surgery;  Laterality: N/A;   TEE WITHOUT CARDIOVERSION N/A 12/11/2022   Procedure: TRANSESOPHAGEAL ECHOCARDIOGRAM (TEE);  Surgeon: Rolan Ezra RAMAN, MD;  Location: [facility] ENDOSCOPY;  Service: Cardiovascular;  Laterality: N/A;   TEMPORARY PACEMAKER N/A 03/22/2019   Procedure: TEMPORARY PACEMAKER;  Surgeon: Claudene Victory ORN, MD;  Location: [facility] INVASIVE CV LAB;  Service: Cardiovascular;  Laterality: N/A;   TOTAL KNEE ARTHROPLASTY Right 01/19/2014   Procedure: RIGHT TOTAL KNEE ARTHROPLASTY, Steroid injection left knee;  Surgeon: [name], MD;  Location: [facility];  Service: Orthopedics;  Laterality: Right;   TOTAL KNEE ARTHROPLASTY Left 10/23/2014   Procedure: LEFT TOTAL KNEE ARTHROPLASTY;  Surgeon: [name], MD;  Location: [facility];  Service: Orthopedics;  Laterality:  Left;   UMBILICAL HERNIA REPAIR N/A 07/10/2016   Procedure: UMBILICAL HERNIA REPAIR;  Surgeon: Vicenta Poli, MD;  Location: [facility];  Service: General;  Laterality: N/A;    Family History  Problem Relation Age of Onset   Hypertension Father    Colon cancer Father        dx in his late 67's   Diabetes Mother    Stomach cancer Neg Hx    Esophageal cancer Neg Hx    Pancreatic cancer Neg Hx    Rectal cancer Neg Hx    Social History:  reports that he has never smoked. He has never used smokeless tobacco. He reports that he does not currently use alcohol. He reports that he does not use drugs.  Allergies: No Known Allergies  Medications Prior to Admission  Medication Sig Dispense Refill   acetaminophen  (TYLENOL ) 500 MG tablet Take 500 mg by mouth 4 (four) times daily.     ALPRAZolam  (XANAX ) 0.5 MG tablet Take 0.5 mg by mouth 2 (two) times daily.     atorvastatin  (LIPITOR) 20 MG tablet Take 20 mg by mouth every morning.     bismuth  subsalicylate (PEPTO BISMOL) 262 MG/15ML suspension Take 30 mLs by mouth every 6 (six) hours as needed for indigestion or diarrhea or loose stools.     Docusate Sodium  (DSS) 100 MG CAPS Take 300 mg by mouth every morning.     meclizine  (ANTIVERT ) 25 MG tablet Take 50 mg by mouth daily as needed for dizziness.     Melatonin 10 MG TABS Take 10 mg by mouth at bedtime.     metoprolol  succinate (TOPROL -XL) 25 MG 24 hr tablet Take 1 tablet (25 mg total) by mouth daily. Take with or immediately following a meal. 90 tablet 3   Multiple Vitamin (MULTIVITAMIN WITH MINERALS) TABS tablet Take 1 tablet by mouth every morning.     oxyCODONE  (ROXICODONE ) 15 MG immediate release tablet Take 15 mg by mouth 4 (four) times daily.     sacubitril -valsartan  (ENTRESTO ) 49-51 MG Take 1 tablet by mouth 2 (two) times daily. 180 tablet 3   spironolactone  (ALDACTONE ) 25 MG tablet Take 1 tablet (25 mg total) by mouth daily. 90 tablet 3   torsemide  (DEMADEX ) 20 MG tablet  Take 1 tablet (20 mg total) by mouth daily. 90 tablet 3   warfarin (COUMADIN ) 5 MG tablet TAKE 1 TO 1 AND 1/2 TABLETS BY MOUTH DAILY AS DIRECTED BY COUMADIN  CLINIC (Patient taking differently: Take 5-7.5 mg by mouth See admin instructions. Take 7.5 mg on Sunday, Monday, Wednesday and Friday all the other days take 5 mg at bedtime AS DIRECTED BY COUMADIN  CLINIC) 100 tablet 1   silver  sulfADIAZINE  (SILVADENE ) 1 %  cream Apply 1 Application topically daily. Apply to affected area daily plus dry dressing (Patient not taking: Reported on 06/08/2024) 400 g 3    No results found for this or any previous visit (from the past 48 hours). No results found.  Review of Systems  All other systems reviewed and are negative.   Height 6' 3 (1.905 m), weight 119.3 kg. Physical Exam  Patient is alert, oriented, no adenopathy, well-dressed, normal affect, normal respiratory effort. Examination the wound bed has flat healthy granulation tissue it measures 6.5 x 4.5 cm.  There is no surrounding cellulitis no odor or drainage.  Patient states the wound got bigger when using Vashe dressing changes. Assessment/Plan Assessment: Chronic traumatic ulcer left leg.   Plan: Patient has most likely developed a biofilm that has not been affected by antibiotics or with Vashe.  Will plan for surgical debridement application of Kerecis tissue graft and application of a peel in place wound VAC dressing.  Plan for outpatient surgery.   Jerona LULLA Sage, MD 06/09/2024, 6:42 AM

## 2024-06-09 NOTE — Op Note (Signed)
 06/09/2024  10:06 AM  PATIENT:  [name]    PRE-OPERATIVE DIAGNOSIS:  chronic left leg wound, acute hematoma right leg.  POST-OPERATIVE DIAGNOSIS:  Same  PROCEDURE: Excisional debridement hematoma right leg. Surgical debridement of the chronic wound with excision of skin soft tissue muscle and fascia of the left leg. Application of Kerecis micro graft 19 cm x 2 left calf. Application of peel in place wound VAC sponge on the left x 1. Tissue cultures from left chronic wound. SURGEON:  Jerona LULLA Sage, MD  PHYSICIAN ASSISTANT:None ANESTHESIA:   General  PREOPERATIVE INDICATIONS:  [name] is a  68 y.o. male with a diagnosis of chronic left leg wound who failed conservative measures and elected for surgical management.    The risks benefits and alternatives were discussed with the patient preoperatively including but not limited to the risks of infection, bleeding, nerve injury, cardiopulmonary complications, the need for revision surgery, among others, and the patient was willing to proceed.  OPERATIVE IMPLANTS:   Implant Name Type Inv. Item Serial No. Manufacturer Lot No. LRB No. Used Action  GRAFT SKIN WND DODSON SOUR - [id number] Tissue GRAFT SKIN WND MARIGEN OMEGA3  [employer] [id number] Left 1 Implanted  GRAFT SKIN WND MARIGEN OMEGA3 - [id number] Tissue GRAFT SKIN WND MARIGEN OMEGA3  [employer] [id number] Left 1 Implanted    @ENCIMAGES @  OPERATIVE FINDINGS: Hematoma was evacuated from the right leg no signs of infection.  Patient had good petechial bleeding on the left leg.  OPERATIVE PROCEDURE: Patient was brought the operating room and underwent a general anesthetic.  After adequate levels anesthesia were obtained patient's bilateral lower extremities were prepped using DuraPrep draped into a sterile field a timeout was called.  A 21 blade knife was used to excise skin soft tissue muscle and fascia from the left leg wound.  After debridement this left the  wound that was 10 x 5 cm.  This was irrigated with Vashe.  Kerecis micro graft 19 cm x 2 was then applied to the peel in place wound VAC sponge and this was then applied to the wound.  This had a good suction fit.  Attention was then focused to the right lower extremity.  A 1 cm incision was made over the hematoma.  A hemostat was used to free up the hematoma and a large hematoma was evacuated from the right leg.  This was also irrigated with Vashe.  This was covered with Adaptic 4 x 4's Curlex and Coban.  Both legs were wrapped in Coban.  Patient was extubated taken the PACU in stable condition.   DISCHARGE PLANNING:  Antibiotic duration: Preoperative antibiotics  Weightbearing: Weightbearing as tolerated  Pain medication: Patient is on a pain management program no prescriptions provided.  Dressing care/ Wound VAC: Wound VAC on the left  Ambulatory devices: Walker or crutches  Discharge to: Home.  Follow-up: In the office 1 week post operative.

## 2024-06-09 NOTE — Progress Notes (Signed)
 CMP was hemolyzed per lab. Istat is done per dr. Tilford

## 2024-06-09 NOTE — Anesthesia Postprocedure Evaluation (Signed)
 Anesthesia Post Note  Patient: NAKSH RADI  Procedure(s) Performed: IRRIGATION AND DEBRIDEMENT BILATERAL LOWER LEGS, APPLICATION OF TISSUE GRAFT AND WOUND VAC LEFT LEG (Left: Leg Lower) APPLICATION, WOUND VAC, LEFT LOWER LEG (Left)     Patient location during evaluation: PACU Anesthesia Type: General Level of consciousness: awake and alert Pain management: pain level controlled Vital Signs Assessment: post-procedure vital signs reviewed and stable Respiratory status: spontaneous breathing, nonlabored ventilation, respiratory function stable and patient connected to nasal cannula oxygen Cardiovascular status: blood pressure returned to baseline and stable Postop Assessment: no apparent nausea or vomiting Anesthetic complications: no   No notable events documented.  Last Vitals:  Vitals:   06/09/24 1045 06/09/24 1109  BP: 130/77 132/80  Pulse: 66   Resp: 12   Temp:    SpO2: 98% 98%    Last Pain:  Vitals:   06/09/24 1109  TempSrc:   PainSc: 4                  Franky JONETTA Bald

## 2024-06-09 NOTE — Progress Notes (Signed)
 Pt's INR 1.7, prothrombin  time 20.5. Last dose of Coumadin  this Tuesday, 08/05. Dr. Harden and dr. Tilford are aware.

## 2024-06-09 NOTE — Transfer of Care (Signed)
 Immediate Anesthesia Transfer of Care Note  Patient: [name]  Procedure(s) Performed: IRRIGATION AND DEBRIDEMENT BILATERAL LOWER LEGS, APPLICATION OF TISSUE GRAFT AND WOUND VAC LEFT LEG (Left: Leg Lower) APPLICATION, WOUND VAC, LEFT LOWER LEG (Left)  Patient Location: PACU  Anesthesia Type:General  Level of Consciousness: awake, alert , oriented, and patient cooperative  Airway & Oxygen Therapy: Patient Spontanous Breathing  Post-op Assessment: Report given to RN, Post -op Vital signs reviewed and stable, Patient moving all extremities X 4, and Patient able to stick tongue midline  Post vital signs: Reviewed and stable  Last Vitals:  Vitals Value Taken Time  BP 131/91 06/09/24 10:12  Temp 97.9   Pulse 70 06/09/24 10:13  Resp 16 06/09/24 10:13  SpO2 98 % 06/09/24 10:13  Vitals shown include unfiled device data.  Last Pain:  Vitals:   06/09/24 0708  TempSrc:   PainSc: 0-No pain         Complications: No notable events documented.

## 2024-06-09 NOTE — Anesthesia Procedure Notes (Signed)
 Procedure Name: LMA Insertion Date/Time: 06/09/2024 9:40 AM  Performed by: Viviana Almarie DASEN, CRNAPre-anesthesia Checklist: Patient identified, Emergency Drugs available, Suction available and Patient being monitored Patient Re-evaluated:Patient Re-evaluated prior to induction Oxygen Delivery Method: Circle System Utilized Preoxygenation: Pre-oxygenation with 100% oxygen Induction Type: IV induction Ventilation: Mask ventilation without difficulty LMA: LMA inserted LMA Size: 4.0 Tube type: Oral Number of attempts: 1 Airway Equipment and Method: Bite block Placement Confirmation: positive ETCO2 Tube secured with: Tape Dental Injury: Teeth and Oropharynx as per pre-operative assessment

## 2024-06-09 NOTE — Progress Notes (Signed)
 Per lab CMP should be resulted in 15 min

## 2024-06-09 NOTE — Interval H&P Note (Signed)
 History and Physical Interval Note:  06/09/2024 9:03 AM  [name]  has presented today for surgery, with the diagnosis of chronic left leg wound.  The various methods of treatment have been discussed with the patient and family. After consideration of risks, benefits and other options for treatment, the patient has consented to  Procedure(s) with comments: IRRIGATION AND DEBRIDEMENT WOUND (Left) - LEFT LEG DEBRIDEMENT and TISSUE GRAFT as a surgical intervention.  The patient's history has been reviewed, patient examined, no change in status, stable for surgery.  I have reviewed the patient's chart and labs.  Questions were answered to the patient's satisfaction.     Angle Karel V Cheryle Dark

## 2024-06-12 ENCOUNTER — Other Ambulatory Visit: Payer: Self-pay | Admitting: Orthopedic Surgery

## 2024-06-12 ENCOUNTER — Encounter (HOSPITAL_COMMUNITY): Payer: Self-pay | Admitting: Orthopedic Surgery

## 2024-06-12 MED ORDER — DOXYCYCLINE HYCLATE 100 MG PO TABS
100.0000 mg | ORAL_TABLET | Freq: Two times a day (BID) | ORAL | 0 refills | Status: AC
Start: 2024-06-12 — End: ?

## 2024-06-13 ENCOUNTER — Ambulatory Visit: Payer: Self-pay

## 2024-06-13 NOTE — Telephone Encounter (Signed)
-----   Message from Jerona LULLA Sage sent at 06/12/2024 11:41 AM EDT ----- Call patient.  His wound cultures are showing staph aureus that is very sensitive to doxycycline .  Will replace the doxycycline . ----- Message ----- From: Rebecka, Lab In [place] Sent: 06/09/2024   4:27 PM EDT To: Jerona Sage LULLA, MD

## 2024-06-13 NOTE — Telephone Encounter (Signed)
 I called and confirmed pt picked up ABX and has started taking it. He has follow up on Friday and will call with any questions.

## 2024-06-14 LAB — AEROBIC/ANAEROBIC CULTURE W GRAM STAIN (SURGICAL/DEEP WOUND)

## 2024-06-15 ENCOUNTER — Ambulatory Visit (INDEPENDENT_AMBULATORY_CARE_PROVIDER_SITE_OTHER): Admitting: Physician Assistant

## 2024-06-15 ENCOUNTER — Encounter: Payer: Self-pay | Admitting: Physician Assistant

## 2024-06-15 DIAGNOSIS — S81802D Unspecified open wound, left lower leg, subsequent encounter: Secondary | ICD-10-CM

## 2024-06-15 NOTE — Progress Notes (Signed)
 Office Visit Note   Patient: [name]           Date of Birth: 25-Mar-1956           MRN: [id number] Visit Date: 06/15/2024              Requested by: Pura Lenis, MD [address] [place],  [place] [address] PCP: Pura Lenis, MD  Chief Complaint  Patient presents with   Right Leg - Routine Post Op    06/09/2024 RLE I&D and graft application       [name] is a  68 y.o. male with a diagnosis of chronic left leg wound who failed conservative measures and elected for surgical management.  The ulcer has been present for 2 years and 4 months.  He is s/p PROCEDURE: Excisional debridement hematoma right leg. Surgical debridement of the chronic wound with excision of skin soft tissue muscle and fascia of the left leg. Application of Kerecis micro graft 19 cm x 2 left calf. Application of peel in place wound VAC sponge on the left x 1.Tissue cultures from left chronic wound.  He called our office to report that the wound vac stopped suctioning and he is here for exam.     Assessment & Plan: Visit Diagnoses: No diagnosis found.  Plan: Vashe wet to dry daily with ace compression and elevation.    Follow-Up Instructions: No follow-ups on file.   Ortho Exam  Patient is alert, oriented, no adenopathy, well-dressed, normal affect, normal respiratory effort. Wound vac removed to reveal good skin edges without cellulitis.  The wound measures 5.5 cm x 8 cm.  The wound was gently washed with Vashe and dressing with wet to dry Vashe and ace warp compression.   The right leg is healed without wounds.    Imaging: No results found.    Labs: Lab Results  Component Value Date   HGBA1C 5.4 01/29/2022   HGBA1C 5.2 03/16/2019   REPTSTATUS 06/14/2024 FINAL 06/09/2024   GRAMSTAIN  06/09/2024    FEW GRAM POSITIVE COCCI FEW GRAM POSITIVE RODS RARE WBC PRESENT, PREDOMINANTLY PMN    CULT  06/09/2024    ABUNDANT STAPHYLOCOCCUS AUREUS ABUNDANT  CORYNEBACTERIUM STRIATUM Standardized susceptibility testing for this organism is not available. NO ANAEROBES ISOLATED Performed at [facility], [address], [place], [place] [address]    [facility] STAPHYLOCOCCUS AUREUS 06/09/2024     Lab Results  Component Value Date   ALBUMIN  4.0 01/01/2023   ALBUMIN  4.0 11/20/2022   ALBUMIN  2.5 (L) 01/30/2022    Lab Results  Component Value Date   MG 2.1 01/29/2022   MG 2.1 06/19/2019   MG 2.2 03/25/2019   No results found for: VD25OH  No results found for: PREALBUMIN    Latest Ref Rng & Units 06/09/2024    9:03 AM 06/09/2024    7:07 AM 11/13/2023    4:28 AM  CBC EXTENDED  WBC 4.0 - 10.5 K/uL  6.3  10.7   RBC 4.22 - 5.81 MIL/uL  4.52  4.03   Hemoglobin 13.0 - 17.0 g/dL 84.9  85.5  87.3   HCT 39.0 - 52.0 % 44.0  44.4  39.2   Platelets 150 - 400 K/uL  164  175      There is no height or weight on file to calculate BMI.  Orders:  No orders of the defined types were placed in this encounter.  No orders of the defined types  were placed in this encounter.    Procedures: No procedures performed  Clinical Data: No additional findings.  ROS:  All other systems negative, except as noted in the HPI. Review of Systems  Objective: Vital Signs: There were no vitals taken for this visit.  Specialty Comments:  No specialty comments available.  PMFS History: Patient Active Problem List   Diagnosis Date Noted   Leg hematoma, right, initial encounter 06/09/2024   Status post revision of total hip 11/12/2023   Recurrent dislocation of right hip joint prosthesis (HCC) 11/11/2023   Left ventricular dysfunction 10/13/2022   Rectal bleeding 01/30/2022   Abnormal finding on imaging 01/30/2022   GIB (gastrointestinal bleeding) 01/29/2022   Hyponatremia 01/29/2022   Hypocalcemia 01/29/2022   Alcohol abuse 01/29/2022   AKI (acute kidney injury) (HCC) 01/29/2022   Symptomatic anemia 01/29/2022   Venous stasis dermatitis of  both lower extremities    Bleeding on Coumadin     Leg wound, left, subsequent encounter 12/30/2021   Compartment syndrome of left lower extremity (HCC) 09/22/2021   Status post surgery 09/22/2021   Compartment syndrome of left upper extremity (HCC) 09/22/2021   Hyperlipidemia 12/13/2020   Atrial fibrillation (HCC) 06/10/2020   Long term (current) use of anticoagulants 06/10/2020   Secondary hypercoagulable state (HCC) 04/24/2020   AV junctional bradycardia 03/22/2019   S/P minimally invasive mitral valve repair  03/21/2019   S/P Maze operation for atrial fibrillation 03/21/2019   Dilated aortic root (HCC) 01/04/2019   Dilated cardiomyopathy (HCC) 01/04/2019   Atrial flutter with rapid ventricular response (HCC)    Mitral valve prolapse    Hypertensive urgency 12/30/2018   Acute on chronic systolic (congestive) heart failure (HCC) 12/30/2018   Acute systolic heart failure (HCC) 12/30/2018   Chronic systolic (congestive) heart failure (HCC)    Persistent atrial fibrillation (HCC) 12/02/2018   Right hamstring muscle strain 07/20/2018   Essential hypertension 02/07/2016   Severe mitral regurgitation    Annual physical exam 11/16/2014   Arthritis of left knee 10/23/2014   Status post total left knee replacement 10/23/2014   Arthritis of knee, right 01/19/2014   Status post total knee replacement 01/19/2014   Benign paroxysmal positional vertigo 10/30/2013   Testicular hypofunction 03/24/2012   Past Medical History:  Diagnosis Date   Acute on chronic systolic (congestive) heart failure (HCC) 12/30/2018   Allergy    Anxiety    Arthritis    Atrial flutter with rapid ventricular response (HCC)    Chronic systolic (congestive) heart failure (HCC)    COVID 10/2021   mild   Dilated aortic root (HCC) 01/04/2019   Dilated cardiomyopathy (HCC) 01/04/2019   Dysrhythmia    Heart murmur    History of blood transfusion    01/27/22   History of colon polyps    Hypertension    Insomnia     Mitral valve prolapse    Mitral valve regurgitation    Persistent atrial fibrillation (HCC) 12/02/2018   S/P Maze operation for atrial fibrillation 03/21/2019   Complete bilateral atrial lesion set using cryothermy and bipolar radiofrequency ablation with clipping of LA appendage via right mini thoracotomy approach   S/P minimally invasive mitral valve repair 03/21/2019   Complex valvuloplasty including triangular resection of posterior leaflet, artificial Gore-tex neochord placement x4 and 36 mm Sorin Memo 4D ring annuloplasty via right mini thoracotomy approach   Seizures (HCC)    had one approx. 30 yrs ago,has not had any since; due to alchol and no sleep  Severe mitral regurgitation     Family History  Problem Relation Age of Onset   Hypertension Father    Colon cancer Father        dx in his late 5's   Diabetes Mother    Stomach cancer Neg Hx    Esophageal cancer Neg Hx    Pancreatic cancer Neg Hx    Rectal cancer Neg Hx     Past Surgical History:  Procedure Laterality Date   ANKLE FUSION  left   Dec. 2012   ANTERIOR HIP REVISION Right 11/12/2023   Procedure: REVISION OF RIGHT TOTAL HIP ARTHROPLASTY;  Surgeon: Vernetta Lonni GRADE, MD;  Location: [facility];  Service: Orthopedics;  Laterality: Right;   APPLICATION OF WOUND VAC Left 09/22/2021   Procedure: APPLICATION OF WOUND VAC;  Surgeon: Vernetta Lonni GRADE, MD;  Location: [facility];  Service: Orthopedics;  Laterality: Left;   APPLICATION OF WOUND VAC Left 12/30/2021   Procedure: APPLICATION OF WOUND VAC;  Surgeon: Vernetta Lonni GRADE, MD;  Location: [facility];  Service: Orthopedics;  Laterality: Left;   APPLICATION OF WOUND VAC Left 04/10/2022   Procedure: APPLICATION OF WOUND VAC;  Surgeon: Harden Jerona GAILS, MD;  Location: [facility];  Service: Orthopedics;  Laterality: Left;   APPLICATION OF WOUND VAC Left 06/09/2024   Procedure: APPLICATION, WOUND VAC, LEFT LOWER LEG;  Surgeon: Harden Jerona GAILS, MD;  Location: [facility];  Service:  Orthopedics;  Laterality: Left;   ARTHROSCOPY KNEE W/ DRILLING  bilateral   2012   ATRIAL FIBRILLATION ABLATION N/A 07/15/2023   Procedure: ATRIAL FIBRILLATION ABLATION;  Surgeon: Nancey Eulas BRAVO, MD;  Location: [facility];  Service: Cardiovascular;  Laterality: N/A;   BIOPSY  01/30/2022   Procedure: BIOPSY;  Surgeon: Wilhelmenia Aloha Raddle., MD;  Location: [facility];  Service: Gastroenterology;;   CARDIAC VALVE REPLACEMENT     CARDIOVERSION N/A 01/03/2019   Procedure: CARDIOVERSION;  Surgeon: Shlomo Wilbert SAUNDERS, MD;  Location: [facility];  Service: Cardiovascular;  Laterality: N/A;   CARDIOVERSION N/A 06/23/2019   Procedure: CARDIOVERSION;  Surgeon: Okey Vina GAILS, MD;  Location: [facility] ENDOSCOPY;  Service: Cardiovascular;  Laterality: N/A;   CARDIOVERSION N/A 12/11/2022   Procedure: CARDIOVERSION;  Surgeon: Rolan Ezra RAMAN, MD;  Location: [facility] ENDOSCOPY;  Service: Cardiovascular;  Laterality: N/A;   CLIPPING OF ATRIAL APPENDAGE  03/21/2019   Procedure: Clipping Of Atrial Appendage using 45mm Atricure Pro2 Clip;  Surgeon: Dusty Sudie DEL, MD;  Location: [facility] OR;  Service: Open Heart Surgery;;   COLONOSCOPY     x2   COLONOSCOPY N/A 01/30/2022   Procedure: COLONOSCOPY;  Surgeon: Wilhelmenia Aloha Raddle., MD;  Location: [facility];  Service: Gastroenterology;  Laterality: N/A;   ESOPHAGOGASTRODUODENOSCOPY N/A 01/30/2022   Procedure: ESOPHAGOGASTRODUODENOSCOPY (EGD);  Surgeon: Wilhelmenia Aloha Raddle., MD;  Location: [facility];  Service: Gastroenterology;  Laterality: N/A;   FOOT ARTHRODESIS  06/23/2012   Procedure: ARTHRODESIS FOOT;  Surgeon: Norleen Armor, MD;  [facility];  Service: Orthopedics;  Laterality: Left;  Left Subtalar and Talonavicular Joint Revision Arthrodesis  Aspiration of Bone Marrow from Left Hip    HAIR TRANSPLANT     HARDWARE REMOVAL  06/23/2012   Procedure: HARDWARE REMOVAL;  Surgeon: Norleen Armor, MD;  Location: [facility] OR;  Service: Orthopedics;  Laterality: Left;  Removal  of Deep Implant  X's 3   I & D EXTREMITY Left 09/23/2021   Procedure: IRRIGATION AND DEBRIDEMENT OF LEG;  Surgeon: Vernetta Lonni GRADE, MD;  [facility];  Service: Orthopedics;  Laterality: Left;   I & D EXTREMITY Left 09/26/2021   Procedure: REPEAT IRRIGATION AND DEBRIDEMENT LEFT LEG, POSSIBLE WOUND CLOSURE, POSSIBLE VAC CHANGE, POSSIBLE SKIN GRAFT;  Surgeon: Vernetta Lonni GRADE, MD;  Location: [facility];  Service: Orthopedics;  Laterality: Left;   I & D EXTREMITY Left 12/30/2021   Procedure: IRRIGATION AND DEBRIDEMENT LEFT LOWER LEG WOUND;  Surgeon: Vernetta Lonni GRADE, MD;  [facility];  Service: Orthopedics;  Laterality: Left;   I & D EXTREMITY Left 01/02/2022   Procedure: LEFT LEG DEBRIDEMENT AND TISSUE GRAFT;  Surgeon: Harden Jerona GAILS, MD;  Location: [facility] OR;  Service: Orthopedics;  Laterality: Left;   INCISION AND DRAINAGE OF WOUND Left 06/09/2024   Procedure: IRRIGATION AND DEBRIDEMENT BILATERAL LOWER LEGS, APPLICATION OF TISSUE GRAFT AND WOUND VAC LEFT LEG;  Surgeon: Harden Jerona GAILS, MD;  Location: [facility];  Service: Orthopedics;  Laterality: Left;  LEFT LEG DEBRIDEMENT and TISSUE GRAFT   INCISION AND DRAINAGE WOUND WITH FASCIOTOMY Left 09/22/2021   Procedure: INCISION AND DRAINAGE WOUND WITH FASCIOTOMY;  Surgeon: Vernetta Lonni GRADE, MD;  Location: [facility];  Service: Orthopedics;  Laterality: Left;   INSERTION OF MESH N/A 07/10/2016   Procedure: INSERTION OF MESH;  Surgeon: Vicenta Vernetta, MD;  Location: [facility];  Service: General;  Laterality: N/A;   JOINT REPLACEMENT     right hip  01-2011   LEFT HEART CATH AND CORONARY ANGIOGRAPHY N/A 03/16/2019   Procedure: LEFT HEART CATH AND CORONARY ANGIOGRAPHY;  Surgeon: Wonda Sharper, MD;  Location: [facility] INVASIVE CV LAB;  Service: Cardiovascular;  Laterality: N/A;   LIMB SPARING RESECTION HIP W/ SADDLE JOINT REPLACEMENT Right    MINIMALLY INVASIVE MAZE PROCEDURE N/A 03/21/2019   Procedure: MINIMALLY INVASIVE MAZE  PROCEDURE;  Surgeon: Dusty Sudie DEL, MD;  Location: [facility] OR;  Service: Open Heart Surgery;  Laterality: N/A;   MITRAL VALVE REPAIR Right 03/21/2019   Procedure: MINIMALLY INVASIVE MITRAL VALVE REPAIR (MVR) using Memo 4D ring size 36;  Surgeon: Dusty Sudie DEL, MD;  [facility];  Service: Open Heart Surgery;  Laterality: Right;   POLYPECTOMY  01/30/2022   Procedure: POLYPECTOMY;  Surgeon: Mansouraty, Aloha Raddle., MD;  Location: [facility];  Service: Gastroenterology;;   SKIN SPLIT GRAFT Left 04/10/2022   Procedure: APPLY SKIN GRAFT TO LEFT LEG WOUND;  Surgeon: Harden Jerona GAILS, MD;  Location: [facility] OR;  Service: Orthopedics;  Laterality: Left;   TEE WITHOUT CARDIOVERSION N/A 01/03/2019   Procedure: TRANSESOPHAGEAL ECHOCARDIOGRAM (TEE);  Surgeon: Shlomo Wilbert SAUNDERS, MD;  Location: [facility] ENDOSCOPY;  Service: Cardiovascular;  Laterality: N/A;   TEE WITHOUT CARDIOVERSION N/A 03/21/2019   Procedure: TRANSESOPHAGEAL ECHOCARDIOGRAM (TEE);  Surgeon: Dusty Sudie DEL, MD;  Location: [facility] OR;  Service: Open Heart Surgery;  Laterality: N/A;   TEE WITHOUT CARDIOVERSION N/A 12/11/2022   Procedure: TRANSESOPHAGEAL ECHOCARDIOGRAM (TEE);  Surgeon: Rolan Ezra RAMAN, MD;  Location: [facility] ENDOSCOPY;  Service: Cardiovascular;  Laterality: N/A;   TEMPORARY PACEMAKER N/A 03/22/2019   Procedure: TEMPORARY PACEMAKER;  Surgeon: Claudene Victory ORN, MD;  Location: [facility] INVASIVE CV LAB;  Service: Cardiovascular;  Laterality: N/A;   TOTAL KNEE ARTHROPLASTY Right 01/19/2014   Procedure: RIGHT TOTAL KNEE ARTHROPLASTY, Steroid injection left knee;  Surgeon: Lonni GRADE Vernetta, MD;  Location: [facility];  Service: Orthopedics;  Laterality: Right;   TOTAL KNEE ARTHROPLASTY Left 10/23/2014   Procedure: LEFT TOTAL KNEE ARTHROPLASTY;  Surgeon: Lonni GRADE Vernetta, MD;  Location: [facility];  Service: Orthopedics;  Laterality: Left;  UMBILICAL HERNIA REPAIR N/A 07/10/2016   Procedure: UMBILICAL HERNIA REPAIR;  Surgeon: Vicenta Poli, MD;  Location: [facility];  Service: General;  Laterality: N/A;   Social History   Occupational History   Occupation: Teaching laboratory technician  Tobacco Use   Smoking status: Never   Smokeless tobacco: Never  Vaping Use   Vaping status: Never Used  Substance and Sexual Activity   Alcohol use: Not Currently    Comment: 2 drinks per day of wine or white claw   Drug use: No   Sexual activity: Yes

## 2024-06-16 ENCOUNTER — Encounter: Admitting: Family

## 2024-06-16 ENCOUNTER — Telehealth: Payer: Self-pay

## 2024-06-16 NOTE — Telephone Encounter (Signed)
 SW pt, told him per Rocky, okay to wear compression sock as well.

## 2024-06-16 NOTE — Telephone Encounter (Signed)
 Patient wanted to know if he should/shouldn't be wearing his compression sock.  CB# [phone].  Please advise.  Thank you.

## 2024-06-23 ENCOUNTER — Ambulatory Visit (INDEPENDENT_AMBULATORY_CARE_PROVIDER_SITE_OTHER): Admitting: Physician Assistant

## 2024-06-23 ENCOUNTER — Encounter: Payer: Self-pay | Admitting: Physician Assistant

## 2024-06-23 DIAGNOSIS — S81802D Unspecified open wound, left lower leg, subsequent encounter: Secondary | ICD-10-CM

## 2024-06-23 NOTE — Progress Notes (Signed)
 Office Visit Note   Patient: [name]           Date of Birth: 1956/05/01           MRN: [id number] Visit Date: 06/23/2024              Requested by: Pura Lenis, MD [address] [place],  [place] [address] PCP: Pura Lenis, MD  Chief Complaint  Patient presents with   Left Leg - Routine Post Op    06/09/2024 I&D LLE      HPI: [name] is a 68 y.o. male with a diagnosis of chronic left leg wound who failed conservative measures and elected for surgical management. He is s/p Excisional debridement hematoma right leg with Kerecis tissue graft application.  He was instructed to do Vashe wet to dry dressing changes daily.  He is here for follow up exam.  Assessment & Plan: Visit Diagnoses:  1. Leg wound, left, subsequent encounter     Plan: We discussed the importance of elevation to reduce edema.  He states he could take a zero gravity chair to work and elevate through his day.  We also talked about compression.  I wrapped his left LE from the toes to just below the knee with 2 4 x 4 ace warps.  He does have compression socks at home as well.  Continue Vashe dressing changes Daily.    Follow-Up Instructions: Return today (on 06/23/2024). He would return normally in 1 week, but he is out of town next week.   Ortho Exam  Patient is alert, oriented, no adenopathy, well-dressed, normal affect, normal respiratory effort. The wound measures 5.5 cm x 8 cm 0.2 cm depth at the skin edges. Siver nitrate was applied to skin edge bleeding.  The wound was  cleaned with Vashe and dressing with wet to dry Vashe and ace warp compression.     Imaging:   Labs: Lab Results  Component Value Date   HGBA1C 5.4 01/29/2022   HGBA1C 5.2 03/16/2019   REPTSTATUS 06/14/2024 FINAL 06/09/2024   GRAMSTAIN  06/09/2024    FEW GRAM POSITIVE COCCI FEW GRAM POSITIVE RODS RARE WBC PRESENT, PREDOMINANTLY PMN    CULT  06/09/2024    ABUNDANT STAPHYLOCOCCUS AUREUS ABUNDANT  CORYNEBACTERIUM STRIATUM Standardized susceptibility testing for this organism is not available. NO ANAEROBES ISOLATED Performed at [facility], [address], [place], [place] [address]    [facility] STAPHYLOCOCCUS AUREUS 06/09/2024     Lab Results  Component Value Date   ALBUMIN  4.0 01/01/2023   ALBUMIN  4.0 11/20/2022   ALBUMIN  2.5 (L) 01/30/2022    Lab Results  Component Value Date   MG 2.1 01/29/2022   MG 2.1 06/19/2019   MG 2.2 03/25/2019   No results found for: VD25OH  No results found for: PREALBUMIN    Latest Ref Rng & Units 06/09/2024    9:03 AM 06/09/2024    7:07 AM 11/13/2023    4:28 AM  CBC EXTENDED  WBC 4.0 - 10.5 K/uL  6.3  10.7   RBC 4.22 - 5.81 MIL/uL  4.52  4.03   Hemoglobin 13.0 - 17.0 g/dL 84.9  85.5  87.3   HCT 39.0 - 52.0 % 44.0  44.4  39.2   Platelets 150 - 400 K/uL  164  175      There is no height or weight on file to calculate BMI.  Orders:  No orders of the defined types were  placed in this encounter.  No orders of the defined types were placed in this encounter.    Procedures: No procedures performed  Clinical Data: No additional findings.  ROS:  All other systems negative, except as noted in the HPI. Review of Systems  Objective: Vital Signs: There were no vitals taken for this visit.  Specialty Comments:  No specialty comments available.  PMFS History: Patient Active Problem List   Diagnosis Date Noted   Leg hematoma, right, initial encounter 06/09/2024   Status post revision of total hip 11/12/2023   Recurrent dislocation of right hip joint prosthesis (HCC) 11/11/2023   Left ventricular dysfunction 10/13/2022   Rectal bleeding 01/30/2022   Abnormal finding on imaging 01/30/2022   GIB (gastrointestinal bleeding) 01/29/2022   Hyponatremia 01/29/2022   Hypocalcemia 01/29/2022   Alcohol abuse 01/29/2022   AKI (acute kidney injury) (HCC) 01/29/2022   Symptomatic anemia 01/29/2022   Venous stasis dermatitis of  both lower extremities    Bleeding on Coumadin     Leg wound, left, subsequent encounter 12/30/2021   Compartment syndrome of left lower extremity (HCC) 09/22/2021   Status post surgery 09/22/2021   Compartment syndrome of left upper extremity (HCC) 09/22/2021   Hyperlipidemia 12/13/2020   Atrial fibrillation (HCC) 06/10/2020   Long term (current) use of anticoagulants 06/10/2020   Secondary hypercoagulable state (HCC) 04/24/2020   AV junctional bradycardia 03/22/2019   S/P minimally invasive mitral valve repair  03/21/2019   S/P Maze operation for atrial fibrillation 03/21/2019   Dilated aortic root (HCC) 01/04/2019   Dilated cardiomyopathy (HCC) 01/04/2019   Atrial flutter with rapid ventricular response (HCC)    Mitral valve prolapse    Hypertensive urgency 12/30/2018   Acute on chronic systolic (congestive) heart failure (HCC) 12/30/2018   Acute systolic heart failure (HCC) 12/30/2018   Chronic systolic (congestive) heart failure (HCC)    Persistent atrial fibrillation (HCC) 12/02/2018   Right hamstring muscle strain 07/20/2018   Essential hypertension 02/07/2016   Severe mitral regurgitation    Annual physical exam 11/16/2014   Arthritis of left knee 10/23/2014   Status post total left knee replacement 10/23/2014   Arthritis of knee, right 01/19/2014   Status post total knee replacement 01/19/2014   Benign paroxysmal positional vertigo 10/30/2013   Testicular hypofunction 03/24/2012   Past Medical History:  Diagnosis Date   Acute on chronic systolic (congestive) heart failure (HCC) 12/30/2018   Allergy    Anxiety    Arthritis    Atrial flutter with rapid ventricular response (HCC)    Chronic systolic (congestive) heart failure (HCC)    COVID 10/2021   mild   Dilated aortic root (HCC) 01/04/2019   Dilated cardiomyopathy (HCC) 01/04/2019   Dysrhythmia    Heart murmur    History of blood transfusion    01/27/22   History of colon polyps    Hypertension    Insomnia     Mitral valve prolapse    Mitral valve regurgitation    Persistent atrial fibrillation (HCC) 12/02/2018   S/P Maze operation for atrial fibrillation 03/21/2019   Complete bilateral atrial lesion set using cryothermy and bipolar radiofrequency ablation with clipping of LA appendage via right mini thoracotomy approach   S/P minimally invasive mitral valve repair 03/21/2019   Complex valvuloplasty including triangular resection of posterior leaflet, artificial Gore-tex neochord placement x4 and 36 mm Sorin Memo 4D ring annuloplasty via right mini thoracotomy approach   Seizures (HCC)    had one approx. 30 yrs ago,has not  had any since; due to alchol and no sleep   Severe mitral regurgitation     Family History  Problem Relation Age of Onset   Hypertension Father    Colon cancer Father        dx in his late 78's   Diabetes Mother    Stomach cancer Neg Hx    Esophageal cancer Neg Hx    Pancreatic cancer Neg Hx    Rectal cancer Neg Hx     Past Surgical History:  Procedure Laterality Date   ANKLE FUSION  left   Dec. 2012   ANTERIOR HIP REVISION Right 11/12/2023   Procedure: REVISION OF RIGHT TOTAL HIP ARTHROPLASTY;  Surgeon: Vernetta Lonni GRADE, MD;  Location: [facility];  Service: Orthopedics;  Laterality: Right;   APPLICATION OF WOUND VAC Left 09/22/2021   Procedure: APPLICATION OF WOUND VAC;  Surgeon: Vernetta Lonni GRADE, MD;  Location: [facility];  Service: Orthopedics;  Laterality: Left;   APPLICATION OF WOUND VAC Left 12/30/2021   Procedure: APPLICATION OF WOUND VAC;  Surgeon: Vernetta Lonni GRADE, MD;  Location: [facility];  Service: Orthopedics;  Laterality: Left;   APPLICATION OF WOUND VAC Left 04/10/2022   Procedure: APPLICATION OF WOUND VAC;  Surgeon: Harden Jerona GAILS, MD;  Location: [facility];  Service: Orthopedics;  Laterality: Left;   APPLICATION OF WOUND VAC Left 06/09/2024   Procedure: APPLICATION, WOUND VAC, LEFT LOWER LEG;  Surgeon: Harden Jerona GAILS, MD;  Location: [facility];  Service:  Orthopedics;  Laterality: Left;   ARTHROSCOPY KNEE W/ DRILLING  bilateral   2012   ATRIAL FIBRILLATION ABLATION N/A 07/15/2023   Procedure: ATRIAL FIBRILLATION ABLATION;  Surgeon: Nancey Eulas BRAVO, MD;  Location: [facility];  Service: Cardiovascular;  Laterality: N/A;   BIOPSY  01/30/2022   Procedure: BIOPSY;  Surgeon: Wilhelmenia Aloha Raddle., MD;  Location: [facility];  Service: Gastroenterology;;   CARDIAC VALVE REPLACEMENT     CARDIOVERSION N/A 01/03/2019   Procedure: CARDIOVERSION;  Surgeon: Shlomo Wilbert SAUNDERS, MD;  Location: [facility];  Service: Cardiovascular;  Laterality: N/A;   CARDIOVERSION N/A 06/23/2019   Procedure: CARDIOVERSION;  Surgeon: Okey Vina GAILS, MD;  Location: [facility];  Service: Cardiovascular;  Laterality: N/A;   CARDIOVERSION N/A 12/11/2022   Procedure: CARDIOVERSION;  Surgeon: Rolan Ezra RAMAN, MD;  Location: [facility] ENDOSCOPY;  Service: Cardiovascular;  Laterality: N/A;   CLIPPING OF ATRIAL APPENDAGE  03/21/2019   Procedure: Clipping Of Atrial Appendage using 45mm Atricure Pro2 Clip;  Surgeon: Dusty Sudie DEL, MD;  Location: [facility] OR;  Service: Open Heart Surgery;;   COLONOSCOPY     x2   COLONOSCOPY N/A 01/30/2022   Procedure: COLONOSCOPY;  Surgeon: Wilhelmenia Aloha Raddle., MD;  Location: [facility];  Service: Gastroenterology;  Laterality: N/A;   ESOPHAGOGASTRODUODENOSCOPY N/A 01/30/2022   Procedure: ESOPHAGOGASTRODUODENOSCOPY (EGD);  Surgeon: Wilhelmenia Aloha Raddle., MD;  Location: [facility];  Service: Gastroenterology;  Laterality: N/A;   FOOT ARTHRODESIS  06/23/2012   Procedure: ARTHRODESIS FOOT;  Surgeon: Norleen Armor, MD;  Location: [facility] OR;  Service: Orthopedics;  Laterality: Left;  Left Subtalar and Talonavicular Joint Revision Arthrodesis  Aspiration of Bone Marrow from Left Hip    HAIR TRANSPLANT     HARDWARE REMOVAL  06/23/2012   Procedure: HARDWARE REMOVAL;  Surgeon: Norleen Armor, MD;  Location: [facility] OR;  Service: Orthopedics;  Laterality: Left;  Removal  of Deep Implant  X's 3   I & D EXTREMITY Left 09/23/2021   Procedure: IRRIGATION AND DEBRIDEMENT OF LEG;  Surgeon: Vernetta Lonni GRADE, MD;  Location: [facility] OR;  Service: Orthopedics;  Laterality: Left;   I & D EXTREMITY Left 09/26/2021   Procedure: REPEAT IRRIGATION AND DEBRIDEMENT LEFT LEG, POSSIBLE WOUND CLOSURE, POSSIBLE VAC CHANGE, POSSIBLE SKIN GRAFT;  Surgeon: Vernetta Lonni GRADE, MD;  [facility];  Service: Orthopedics;  Laterality: Left;   I & D EXTREMITY Left 12/30/2021   Procedure: IRRIGATION AND DEBRIDEMENT LEFT LOWER LEG WOUND;  Surgeon: Vernetta Lonni GRADE, MD;  Location[facility];  Service: Orthopedics;  Laterality: Left;   I & D EXTREMITY Left 01/02/2022   Procedure: LEFT LEG DEBRIDEMENT AND TISSUE GRAFT;  Surgeon: Harden Jerona GAILS, MD;  Location: [facility] OR;  Service: Orthopedics;  Laterality: Left;   INCISION AND DRAINAGE OF WOUND Left 06/09/2024   Procedure: IRRIGATION AND DEBRIDEMENT BILATERAL LOWER LEGS, APPLICATION OF TISSUE GRAFT AND WOUND VAC LEFT LEG;  Surgeon: Harden Jerona GAILS, MD;  Location: [facility];  Service: Orthopedics;  Laterality: Left;  LEFT LEG DEBRIDEMENT and TISSUE GRAFT   INCISION AND DRAINAGE WOUND WITH FASCIOTOMY Left 09/22/2021   Procedure: INCISION AND DRAINAGE WOUND WITH FASCIOTOMY;  Surgeon: Vernetta Lonni GRADE, MD;  Location[facility];  Service: Orthopedics;  Laterality: Left;   INSERTION OF MESH N/A 07/10/2016   Procedure: INSERTION OF MESH;  Surgeon: Vicenta Vernetta, MD;  Location: [facility];  Service: General;  Laterality: N/A;   JOINT REPLACEMENT     right hip  01-2011   LEFT HEART CATH AND CORONARY ANGIOGRAPHY N/A 03/16/2019   Procedure: LEFT HEART CATH AND CORONARY ANGIOGRAPHY;  Surgeon: Wonda Sharper, MD;  Location: [facility] INVASIVE CV LAB;  Service: Cardiovascular;  Laterality: N/A;   LIMB SPARING RESECTION HIP W/ SADDLE JOINT REPLACEMENT Right    MINIMALLY INVASIVE MAZE PROCEDURE N/A 03/21/2019   Procedure: MINIMALLY INVASIVE MAZE  PROCEDURE;  Surgeon: Dusty Sudie DEL, MD;  Location: [facility] OR;  Service: Open Heart Surgery;  Laterality: N/A;   MITRAL VALVE REPAIR Right 03/21/2019   Procedure: MINIMALLY INVASIVE MITRAL VALVE REPAIR (MVR) using Memo 4D ring size 36;  Surgeon: Dusty Sudie DEL, MD;  Location: [facility];  Service: Open Heart Surgery;  Laterality: Right;   POLYPECTOMY  01/30/2022   Procedure: POLYPECTOMY;  Surgeon: Mansouraty, Aloha Raddle., MD;  Location: [facility];  Service: Gastroenterology;;   SKIN SPLIT GRAFT Left 04/10/2022   Procedure: APPLY SKIN GRAFT TO LEFT LEG WOUND;  Surgeon: Harden Jerona GAILS, MD;  Location: [facility] OR;  Service: Orthopedics;  Laterality: Left;   TEE WITHOUT CARDIOVERSION N/A 01/03/2019   Procedure: TRANSESOPHAGEAL ECHOCARDIOGRAM (TEE);  Surgeon: Shlomo Wilbert SAUNDERS, MD;  Location: [facility];  Service: Cardiovascular;  Laterality: N/A;   TEE WITHOUT CARDIOVERSION N/A 03/21/2019   Procedure: TRANSESOPHAGEAL ECHOCARDIOGRAM (TEE);  Surgeon: Dusty Sudie DEL, MD;  Location: [facility] OR;  Service: Open Heart Surgery;  Laterality: N/A;   TEE WITHOUT CARDIOVERSION N/A 12/11/2022   Procedure: TRANSESOPHAGEAL ECHOCARDIOGRAM (TEE);  Surgeon: Rolan Ezra RAMAN, MD;  Location: [facility] ENDOSCOPY;  Service: Cardiovascular;  Laterality: N/A;   TEMPORARY PACEMAKER N/A 03/22/2019   Procedure: TEMPORARY PACEMAKER;  Surgeon: Claudene Victory ORN, MD;  Location: [facility] INVASIVE CV LAB;  Service: Cardiovascular;  Laterality: N/A;   TOTAL KNEE ARTHROPLASTY Right 01/19/2014   Procedure: RIGHT TOTAL KNEE ARTHROPLASTY, Steroid injection left knee;  Surgeon: Lonni GRADE Vernetta, MD;  Location: [facility];  Service: Orthopedics;  Laterality: Right;   TOTAL KNEE ARTHROPLASTY Left 10/23/2014   Procedure: LEFT TOTAL KNEE ARTHROPLASTY;  Surgeon: Lonni GRADE Vernetta, MD;  Location: [facility];  Service: Orthopedics;  Laterality: Left;   UMBILICAL HERNIA REPAIR N/A 07/10/2016   Procedure: UMBILICAL HERNIA REPAIR;  Surgeon: Vicenta Poli, MD;  Location: [facility];  Service: General;  Laterality: N/A;   Social History   Occupational History   Occupation: Teaching laboratory technician  Tobacco Use   Smoking status: Never   Smokeless tobacco: Never  Vaping Use   Vaping status: Never Used  Substance and Sexual Activity   Alcohol use: Not Currently    Comment: 2 drinks per day of wine or white claw   Drug use: No   Sexual activity: Yes

## 2024-06-29 ENCOUNTER — Telehealth: Payer: Self-pay | Admitting: Physician Assistant

## 2024-06-29 NOTE — Telephone Encounter (Signed)
 Patient called and wants to come and pick up a bottle of the wound solution. CB#[phone]

## 2024-06-29 NOTE — Telephone Encounter (Signed)
 Pt had someone come by the office today later this morning. They were given a small bottle of the vashe, cannot provide the large one with limited quanties of this here. They were informed if they need another bottle they will have to purchase themselves. Can get at [employer] and [employer].

## 2024-07-06 ENCOUNTER — Ambulatory Visit (INDEPENDENT_AMBULATORY_CARE_PROVIDER_SITE_OTHER): Admitting: Physician Assistant

## 2024-07-06 DIAGNOSIS — S81802D Unspecified open wound, left lower leg, subsequent encounter: Secondary | ICD-10-CM | POA: Diagnosis not present

## 2024-07-06 NOTE — Progress Notes (Unsigned)
 Office Visit Note   Patient: [name]           Date of Birth: [birth year]           MRN: [id number] Visit Date: 07/06/2024              Requested by: Pura Lenis, MD [address] [place],  [place] [address] PCP: Pura Lenis, MD  No chief complaint on file.     HPI: [name] is a 68 y.o. male with a diagnosis of chronic left leg wound who failed conservative measures and elected for surgical management. He is s/p Excisional debridement hematoma right leg with Kerecis tissue graft application.  He was instructed to do Vashe wet to dry dressing changes daily.  He is here for follow up exam.   We discussed increasing elevation time, even at work if he could.  He stated he has a zero gravity chair he can bring to work and use.  Continue showering with soap and water  then Vashe dressing daily.  Assessment & Plan: Visit Diagnoses: No diagnosis found.  Plan: ***  Follow-Up Instructions: No follow-ups on file.   Ortho Exam  Patient is alert, oriented, no adenopathy, well-dressed, normal affect, normal respiratory effort. ***    Imaging: No results found. No images are attached to the encounter.  Labs: Lab Results  Component Value Date   HGBA1C 5.4 01/29/2022   HGBA1C 5.2 03/16/2019   REPTSTATUS 06/14/2024 FINAL 06/09/2024   GRAMSTAIN  06/09/2024    FEW GRAM POSITIVE COCCI FEW GRAM POSITIVE RODS RARE WBC PRESENT, PREDOMINANTLY PMN    CULT  06/09/2024    ABUNDANT STAPHYLOCOCCUS AUREUS ABUNDANT CORYNEBACTERIUM STRIATUM Standardized susceptibility testing for this organism is not available. NO ANAEROBES ISOLATED Performed at [facility], [address] [place], [place] [address]    [facility] STAPHYLOCOCCUS AUREUS 06/09/2024     Lab Results  Component Value Date   ALBUMIN  4.0 01/01/2023   ALBUMIN  4.0 11/20/2022   ALBUMIN  2.5 (L) 01/30/2022    Lab Results  Component Value Date   MG 2.1 01/29/2022   MG 2.1 06/19/2019   MG  2.2 03/25/2019   No results found for: VD25OH  No results found for: PREALBUMIN    Latest Ref Rng & Units 06/09/2024    9:03 AM 06/09/2024    7:07 AM 11/13/2023    4:28 AM  CBC EXTENDED  WBC 4.0 - 10.5 K/uL  6.3  10.7   RBC 4.22 - 5.81 MIL/uL  4.52  4.03   Hemoglobin 13.0 - 17.0 g/dL 84.9  85.5  87.3   HCT 39.0 - 52.0 % 44.0  44.4  39.2   Platelets 150 - 400 K/uL  164  175      There is no height or weight on file to calculate BMI.  Orders:  No orders of the defined types were placed in this encounter.  No orders of the defined types were placed in this encounter.    Procedures: No procedures performed  Clinical Data: No additional findings.  ROS:  All other systems negative, except as noted in the HPI. Review of Systems  Objective: Vital Signs: There were no vitals taken for this visit.  Specialty Comments:  No specialty comments available.  PMFS History: Patient Active Problem List   Diagnosis Date Noted  . Leg hematoma, right, initial encounter 06/09/2024  . Status post revision of total hip 11/12/2023  . Recurrent dislocation of right hip  joint prosthesis (HCC) 11/11/2023  . Left ventricular dysfunction 10/13/2022  . Rectal bleeding 01/30/2022  . Abnormal finding on imaging 01/30/2022  . GIB (gastrointestinal bleeding) 01/29/2022  . Hyponatremia 01/29/2022  . Hypocalcemia 01/29/2022  . Alcohol abuse 01/29/2022  . AKI (acute kidney injury) (HCC) 01/29/2022  . Symptomatic anemia 01/29/2022  . Venous stasis dermatitis of both lower extremities   . Bleeding on Coumadin    . Leg wound, left, subsequent encounter 12/30/2021  . Compartment syndrome of left lower extremity (HCC) 09/22/2021  . Status post surgery 09/22/2021  . Compartment syndrome of left upper extremity (HCC) 09/22/2021  . Hyperlipidemia 12/13/2020  . Atrial fibrillation (HCC) 06/10/2020  . Long term (current) use of anticoagulants 06/10/2020  . Secondary hypercoagulable state (HCC)  04/24/2020  . AV junctional bradycardia 03/22/2019  . S/P minimally invasive mitral valve repair  03/21/2019  . S/P Maze operation for atrial fibrillation 03/21/2019  . Dilated aortic root (HCC) 01/04/2019  . Dilated cardiomyopathy (HCC) 01/04/2019  . Atrial flutter with rapid ventricular response (HCC)   . Mitral valve prolapse   . Hypertensive urgency 12/30/2018  . Acute on chronic systolic (congestive) heart failure (HCC) 12/30/2018  . Acute systolic heart failure (HCC) 12/30/2018  . Chronic systolic (congestive) heart failure (HCC)   . Persistent atrial fibrillation (HCC) 12/02/2018  . Right hamstring muscle strain 07/20/2018  . Essential hypertension 02/07/2016  . Severe mitral regurgitation   . Annual physical exam 11/16/2014  . Arthritis of left knee 10/23/2014  . Status post total left knee replacement 10/23/2014  . Arthritis of knee, right 01/19/2014  . Status post total knee replacement 01/19/2014  . Benign paroxysmal positional vertigo 10/30/2013  . Testicular hypofunction 03/24/2012   Past Medical History:  Diagnosis Date  . Acute on chronic systolic (congestive) heart failure (HCC) 12/30/2018  . Allergy   . Anxiety   . Arthritis   . Atrial flutter with rapid ventricular response (HCC)   . Chronic systolic (congestive) heart failure (HCC)   . COVID 10/2021   mild  . Dilated aortic root (HCC) 01/04/2019  . Dilated cardiomyopathy (HCC) 01/04/2019  . Dysrhythmia   . Heart murmur   . History of blood transfusion    01/27/22  . History of colon polyps   . Hypertension   . Insomnia   . Mitral valve prolapse   . Mitral valve regurgitation   . Persistent atrial fibrillation (HCC) 12/02/2018  . S/P Maze operation for atrial fibrillation 03/21/2019   Complete bilateral atrial lesion set using cryothermy and bipolar radiofrequency ablation with clipping of LA appendage via right mini thoracotomy approach  . S/P minimally invasive mitral valve repair 03/21/2019    Complex valvuloplasty including triangular resection of posterior leaflet, artificial Gore-tex neochord placement x4 and 36 mm Sorin Memo 4D ring annuloplasty via right mini thoracotomy approach  . Seizures (HCC)    had one approx. 30 yrs ago,has not had any since; due to alchol and no sleep  . Severe mitral regurgitation     Family History  Problem Relation Age of Onset  . Hypertension Father   . Colon cancer Father        dx in his late 79's  . Diabetes Mother   . Stomach cancer Neg Hx   . Esophageal cancer Neg Hx   . Pancreatic cancer Neg Hx   . Rectal cancer Neg Hx     Past Surgical History:  Procedure Laterality Date  . ANKLE FUSION  left   Dec. 2012  .  ANTERIOR HIP REVISION Right 11/12/2023   Procedure: REVISION OF RIGHT TOTAL HIP ARTHROPLASTY;  Surgeon: Vernetta Lonni GRADE, MD;  Location: [facility];  Service: Orthopedics;  Laterality: Right;  . APPLICATION OF WOUND VAC Left 09/22/2021   Procedure: APPLICATION OF WOUND VAC;  Surgeon: Vernetta Lonni GRADE, MD;  Location: [facility];  Service: Orthopedics;  Laterality: Left;  . APPLICATION OF WOUND VAC Left 12/30/2021   Procedure: APPLICATION OF WOUND VAC;  Surgeon: Vernetta Lonni GRADE, MD;  Location: [facility];  Service: Orthopedics;  Laterality: Left;  . APPLICATION OF WOUND VAC Left 04/10/2022   Procedure: APPLICATION OF WOUND VAC;  Surgeon: Harden Jerona GAILS, MD;  Location: [facility] OR;  Service: Orthopedics;  Laterality: Left;  . APPLICATION OF WOUND VAC Left 06/09/2024   Procedure: APPLICATION, WOUND VAC, LEFT LOWER LEG;  Surgeon: Harden Jerona GAILS, MD;  Location: [facility];  Service: Orthopedics;  Laterality: Left;  . ARTHROSCOPY KNEE W/ DRILLING  bilateral   2012  . ATRIAL FIBRILLATION ABLATION N/A 07/15/2023   Procedure: ATRIAL FIBRILLATION ABLATION;  Surgeon: Nancey Eulas BRAVO, MD;  Location: [facility];  Service: Cardiovascular;  Laterality: N/A;  . BIOPSY  01/30/2022   Procedure: BIOPSY;  Surgeon: Wilhelmenia Aloha Raddle., MD;   Location: [facility];  Service: Gastroenterology;;  . CARDIAC VALVE REPLACEMENT    . CARDIOVERSION N/A 01/03/2019   Procedure: CARDIOVERSION;  Surgeon: Shlomo Wilbert SAUNDERS, MD;  Location: [facility] ENDOSCOPY;  Service: Cardiovascular;  Laterality: N/A;  . CARDIOVERSION N/A 06/23/2019   Procedure: CARDIOVERSION;  Surgeon: Okey Vina GAILS, MD;  Location: [facility] END[facility]COPY;  Service: Cardiovascular;  Laterality: N/A;  . CARDIOVERSION N/A 12/11/2022   Procedure: CARDIOVERSION;  Surgeon: Rolan Ezra RAMAN, MD;  Location: [facility] ENDOSCOPY;  Service: Cardiovascular;  Laterality: N/A;  . CLIPPING OF ATRIAL APPENDAGE  03/21/2019   Procedure: Clipping Of Atrial Appendage using 45mm Atricure Pro2 Clip;  Surgeon: Dusty Sudie DEL, MD;  Location: [facility];  Service: Open Heart Surgery;;  . COLONOSCOPY     x2  . COLONOSCOPY N/A 01/30/2022   Procedure: COLONOSCOPY;  Surgeon: Mansouraty, Aloha Raddle., MD;  Location: [facility];  Service: Gastroenterology;  Laterality: N/A;  . ESOPHAGOGASTRODUODENOSCOPY N/A 01/30/2022   Procedure: ESOPHAGOGASTRODUODENOSCOPY (EGD);  Surgeon: Wilhelmenia Aloha Raddle., MD;  Location: [facility];  Service: Gastroenterology;  Laterality: N/A;  . FOOT ARTHRODESIS  06/23/2012   Procedure: ARTHRODESIS FOOT;  Surgeon: Norleen Armor, MD;  Location: [facility] OR;  Service: Orthopedics;  Laterality: Left;  Left Subtalar and Talonavicular Joint Revision Arthrodesis  Aspiration of Bone Marrow from Left Hip   . HAIR TRANSPLANT    . HARDWARE REMOVAL  06/23/2012   Procedure: HARDWARE REMOVAL;  Surgeon: Norleen Armor, MD;  Location: [facility] OR;  Service: Orthopedics;  Laterality: Left;  Removal of Deep Implant  X's 3  . I & D EXTREMITY Left 09/23/2021   Procedure: IRRIGATION AND DEBRIDEMENT OF LEG;  Surgeon: Vernetta Lonni GRADE, MD;  Location: [facility] OR;  Service: Orthopedics;  Laterality: Left;  . I & D EXTREMITY Left 09/26/2021   Procedure: REPEAT IRRIGATION AND DEBRIDEMENT LEFT LEG, POSSIBLE WOUND CLOSURE, POSSIBLE VAC CHANGE, POSSIBLE  SKIN GRAFT;  Surgeon: Vernetta Lonni GRADE, MD;  [facility];  Service: Orthopedics;  Laterality: Left;  . I & D EXTREMITY Left 12/30/2021   Procedure: IRRIGATION AND DEBRIDEMENT LEFT LOWER LEG WOUND;  Surgeon: Vernetta Lonni GRADE, MD;  Location: [facility];  Service: Orthopedics;  Laterality: Left;  . I & D EXTREMITY Left 01/02/2022   Procedure:  LEFT LEG DEBRIDEMENT AND TISSUE GRAFT;  Surgeon: Harden Jerona GAILS, MD;  Location: [facility] OR;  Service: Orthopedics;  Laterality: Left;  . INCISION AND DRAINAGE OF WOUND Left 06/09/2024   Procedure: IRRIGATION AND DEBRIDEMENT BILATERAL LOWER LEGS, APPLICATION OF TISSUE GRAFT AND WOUND VAC LEFT LEG;  Surgeon: Harden Jerona GAILS, MD;  Location: [facility];  Service: Orthopedics;  Laterality: Left;  LEFT LEG DEBRIDEMENT and TISSUE GRAFT  . INCISION AND DRAINAGE WOUND WITH FASCIOTOMY Left 09/22/2021   Procedure: INCISION AND DRAINAGE WOUND WITH FASCIOTOMY;  Surgeon: Vernetta Lonni GRADE, MD;  Location: [facility];  Service: Orthopedics;  Laterality: Left;  . INSERTION OF MESH N/A 07/10/2016   Procedure: INSERTION OF MESH;  Surgeon: Vicenta Vernetta, MD;  Location: [facility];  Service: General;  Laterality: N/A;  . JOINT REPLACEMENT     right hip  01-2011  . LEFT HEART CATH AND CORONARY ANGIOGRAPHY N/A 03/16/2019   Procedure: LEFT HEART CATH AND CORONARY ANGIOGRAPHY;  Surgeon: Wonda Sharper, MD;  Location: [facility] INVASIVE CV LAB;  Service: Cardiovascular;  Laterality: N/A;  . LIMB SPARING RESECTION HIP W/ SADDLE JOINT REPLACEMENT Right   . MINIMALLY INVASIVE MAZE PROCEDURE N/A 03/21/2019   Procedure: MINIMALLY INVASIVE MAZE PROCEDURE;  Surgeon: Dusty Sudie DEL, MD;  Location: [facility] OR;  Service: Open Heart Surgery;  Laterality: N/A;  . MITRAL VALVE REPAIR Right 03/21/2019   Procedure: MINIMALLY INVASIVE MITRAL VALVE REPAIR (MVR) using Memo 4D ring size 36;  Surgeon: Dusty Sudie DEL, MD;  Location: [facility] OR;  Service: Open Heart Surgery;  Laterality: Right;  . POLYPECTOMY   01/30/2022   Procedure: POLYPECTOMY;  Surgeon: Mansouraty, Aloha Raddle., MD;  Location: [facility];  Service: Gastroenterology;;  . SKIN SPLIT GRAFT Left 04/10/2022   Procedure: APPLY SKIN GRAFT TO LEFT LEG WOUND;  Surgeon: Harden Jerona GAILS, MD;  Location: [facility] OR;  Service: Orthopedics;  Laterality: Left;  . TEE WITHOUT CARDIOVERSION N/A 01/03/2019   Procedure: TRANSESOPHAGEAL ECHOCARDIOGRAM (TEE);  Surgeon: Shlomo Wilbert SAUNDERS, MD;  Location: [facility] ENDOSCOPY;  Service: Cardiovascular;  Laterality: N/A;  . TEE WITHOUT CARDIOVERSION N/A 03/21/2019   Procedure: TRANSESOPHAGEAL ECHOCARDIOGRAM (TEE);  Surgeon: Dusty Sudie DEL, MD;  Location: [facility] OR;  Service: Open Heart Surgery;  Laterality: N/A;  . TEE WITHOUT CARDIOVERSION N/A 12/11/2022   Procedure: TRANSESOPHAGEAL ECHOCARDIOGRAM (TEE);  Surgeon: Rolan Ezra RAMAN, MD;  Location: [facility] ENDOSCOPY;  Service: Cardiovascular;  Laterality: N/A;  . TEMPORARY PACEMAKER N/A 03/22/2019   Procedure: TEMPORARY PACEMAKER;  Surgeon: Claudene Victory ORN, MD;  Location: [facility] INVASIVE CV LAB;  Service: Cardiovascular;  Laterality: N/A;  . TOTAL KNEE ARTHROPLASTY Right 01/19/2014   Procedure: RIGHT TOTAL KNEE ARTHROPLASTY, Steroid injection left knee;  Surgeon: Lonni GRADE Vernetta, MD;  Location: [facility];  Service: Orthopedics;  Laterality: Right;  . TOTAL KNEE ARTHROPLASTY Left 10/23/2014   Procedure: LEFT TOTAL KNEE ARTHROPLASTY;  Surgeon: Lonni GRADE Vernetta, MD;  Location: [facility];  Service: Orthopedics;  Laterality: Left;  . UMBILICAL HERNIA REPAIR N/A 07/10/2016   Procedure: UMBILICAL HERNIA REPAIR;  Surgeon: Vicenta Vernetta, MD;  Location: [facility];  Service: General;  Laterality: N/A;   Social History   Occupational History  . Occupation: Teaching laboratory technician  Tobacco Use  . Smoking status: Never  . Smokeless tobacco: Never  Vaping Use  . Vaping status: Never Used  Substance and Sexual Activity  . Alcohol use: Not Currently    Comment: 2 drinks per day of wine or  white claw  .  Drug use: No  . Sexual activity: Yes

## 2024-07-07 ENCOUNTER — Encounter: Payer: Self-pay | Admitting: Physician Assistant

## 2024-07-26 ENCOUNTER — Ambulatory Visit: Attending: Cardiology | Admitting: *Deleted

## 2024-07-26 DIAGNOSIS — I4811 Longstanding persistent atrial fibrillation: Secondary | ICD-10-CM | POA: Diagnosis not present

## 2024-07-26 DIAGNOSIS — Z7901 Long term (current) use of anticoagulants: Secondary | ICD-10-CM

## 2024-07-26 LAB — POCT INR: INR: 2.9 (ref 2.0–3.0)

## 2024-07-26 NOTE — Progress Notes (Signed)
 Description   Continue taking warfarin 1.5 tablets daily except 1 tablet on Tuesday, Thursday, and Saturday.   Recheck INR in 8 weeks.  Coumadin  Clinic [phone].

## 2024-07-26 NOTE — Patient Instructions (Signed)
 Description   INR-2.9; Continue taking warfarin 1.5 tablets daily except 1 tablet on Tuesday, Thursday, and Saturday.   Recheck INR in 8 weeks.  Coumadin  Clinic [phone].

## 2024-08-04 ENCOUNTER — Ambulatory Visit (INDEPENDENT_AMBULATORY_CARE_PROVIDER_SITE_OTHER): Admitting: Physician Assistant

## 2024-08-04 DIAGNOSIS — S81802D Unspecified open wound, left lower leg, subsequent encounter: Secondary | ICD-10-CM | POA: Diagnosis not present

## 2024-08-04 NOTE — Progress Notes (Signed)
 Office Visit Note   Patient: [name]           Date of Birth: April 20, 1956           MRN: [id number] Visit Date: 08/04/2024              Requested by: Pura Lenis, MD [address] [place],  [place] [address] PCP: Pura Lenis, MD  Chief Complaint  Patient presents with   Left Leg - Routine Post Op    06/09/2024 I&D LLE      HPI: [name] is a 68 y.o. male with a diagnosis of chronic left leg wound who failed conservative measures and elected for surgical management. He is s/p Excisional debridement hematoma right leg with Kerecis tissue graft application.  He was instructed to do Vashe wet to dry dressing changes daily.  He is here for follow up exam.              We discussed increasing elevation time, even at work if he could.  He stated he has a zero gravity chair.  He states he may use once or twice at work and he elevates once when he gets home.  He wears compression socks with ace wrap over the socks daily.  I have noted the socks have holes and may have lost there compression at this point.    He is very frustrated and angry today.  He feels like he is not making progress and he states he is doing everything right.    Assessment & Plan: Visit Diagnoses: No diagnosis found.  Plan: He agreed to get a new pair of Vive socks and wear them daily as the dressing right up against the wound.  He can use an ace wrap to hold guaze over the wound area topically if he has drainage.  Having drainage is do to increased fluid in the leg for lack of elevation during the day.  Follow-Up Instructions: Return if symptoms worsen or fail to improve, for per the patient he did not want to schedule a follow up right now.   Ortho Exam  Patient is alert, oriented, no adenopathy, well-dressed, normal affect, normal respiratory effort. The left lateral leg wound has 100 % granulatio flat base.  It measures 8.2 cm x 6.5 cm.  His Vive sock have several holes and seem to be  worn out at this point.  He has edema in the left leg > right.  The left cal measure 46 cm an the right measures 43 cm.  No cellulitis.      Imaging:   Labs: Lab Results  Component Value Date   HGBA1C 5.4 01/29/2022   HGBA1C 5.2 03/16/2019   REPTSTATUS 06/14/2024 FINAL 06/09/2024   GRAMSTAIN  06/09/2024    FEW GRAM POSITIVE COCCI FEW GRAM POSITIVE RODS RARE WBC PRESENT, PREDOMINANTLY PMN    CULT  06/09/2024    ABUNDANT STAPHYLOCOCCUS AUREUS ABUNDANT CORYNEBACTERIUM STRIATUM Standardized susceptibility testing for this organism is not available. NO ANAEROBES ISOLATED Performed at [facility], [address], [place], [place] [address]    [facility] STAPHYLOCOCCUS AUREUS 06/09/2024     Lab Results  Component Value Date   ALBUMIN  4.0 01/01/2023   ALBUMIN  4.0 11/20/2022   ALBUMIN  2.5 (L) 01/30/2022    Lab Results  Component Value Date   MG 2.1 01/29/2022   MG 2.1 06/19/2019   MG 2.2 03/25/2019   No results found for: VD25OH  No  results found for: PREALBUMIN    Latest Ref Rng & Units 06/09/2024    9:03 AM 06/09/2024    7:07 AM 11/13/2023    4:28 AM  CBC EXTENDED  WBC 4.0 - 10.5 K/uL  6.3  10.7   RBC 4.22 - 5.81 MIL/uL  4.52  4.03   Hemoglobin 13.0 - 17.0 g/dL 84.9  85.5  87.3   HCT 39.0 - 52.0 % 44.0  44.4  39.2   Platelets 150 - 400 K/uL  164  175      There is no height or weight on file to calculate BMI.  Orders:  No orders of the defined types were placed in this encounter.  No orders of the defined types were placed in this encounter.    Procedures: No procedures performed  Clinical Data: No additional findings.  ROS:  All other systems negative, except as noted in the HPI. Review of Systems  Objective: Vital Signs: There were no vitals taken for this visit.  Specialty Comments:  No specialty comments available.  PMFS History: Patient Active Problem List   Diagnosis Date Noted   Leg hematoma, right, initial encounter 06/09/2024    Status post revision of total hip 11/12/2023   Recurrent dislocation of right hip joint prosthesis 11/11/2023   Left ventricular dysfunction 10/13/2022   Rectal bleeding 01/30/2022   Abnormal finding on imaging 01/30/2022   GIB (gastrointestinal bleeding) 01/29/2022   Hyponatremia 01/29/2022   Hypocalcemia 01/29/2022   Alcohol abuse 01/29/2022   AKI (acute kidney injury) 01/29/2022   Symptomatic anemia 01/29/2022   Venous stasis dermatitis of both lower extremities    Bleeding on Coumadin     Leg wound, left, subsequent encounter 12/30/2021   Compartment syndrome of left lower extremity 09/22/2021   Status post surgery 09/22/2021   Compartment syndrome of left upper extremity 09/22/2021   Hyperlipidemia 12/13/2020   Atrial fibrillation (HCC) 06/10/2020   Long term (current) use of anticoagulants 06/10/2020   Secondary hypercoagulable state 04/24/2020   AV junctional bradycardia 03/22/2019   S/P minimally invasive mitral valve repair  03/21/2019   S/P Maze operation for atrial fibrillation 03/21/2019   Dilated aortic root 01/04/2019   Dilated cardiomyopathy (HCC) 01/04/2019   Atrial flutter with rapid ventricular response (HCC)    Mitral valve prolapse    Hypertensive urgency 12/30/2018   Acute on chronic systolic (congestive) heart failure (HCC) 12/30/2018   Acute systolic heart failure (HCC) 12/30/2018   Chronic systolic (congestive) heart failure (HCC)    Persistent atrial fibrillation (HCC) 12/02/2018   Right hamstring muscle strain 07/20/2018   Essential hypertension 02/07/2016   Severe mitral regurgitation    Annual physical exam 11/16/2014   Arthritis of left knee 10/23/2014   Status post total left knee replacement 10/23/2014   Arthritis of knee, right 01/19/2014   Status post total knee replacement 01/19/2014   Benign paroxysmal positional vertigo 10/30/2013   Testicular hypofunction 03/24/2012   Past Medical History:  Diagnosis Date   Acute on chronic systolic  (congestive) heart failure (HCC) 12/30/2018   Allergy    Anxiety    Arthritis    Atrial flutter with rapid ventricular response (HCC)    Chronic systolic (congestive) heart failure (HCC)    COVID 10/2021   mild   Dilated aortic root 01/04/2019   Dilated cardiomyopathy (HCC) 01/04/2019   Dysrhythmia    Heart murmur    History of blood transfusion    01/27/22   History of colon polyps  Hypertension    Insomnia    Mitral valve prolapse    Mitral valve regurgitation    Persistent atrial fibrillation (HCC) 12/02/2018   S/P Maze operation for atrial fibrillation 03/21/2019   Complete bilateral atrial lesion set using cryothermy and bipolar radiofrequency ablation with clipping of LA appendage via right mini thoracotomy approach   S/P minimally invasive mitral valve repair 03/21/2019   Complex valvuloplasty including triangular resection of posterior leaflet, artificial Gore-tex neochord placement x4 and 36 mm Sorin Memo 4D ring annuloplasty via right mini thoracotomy approach   Seizures (HCC)    had one approx. 30 yrs ago,has not had any since; due to alchol and no sleep   Severe mitral regurgitation     Family History  Problem Relation Age of Onset   Hypertension Father    Colon cancer Father        dx in his late 44's   Diabetes Mother    Stomach cancer Neg Hx    Esophageal cancer Neg Hx    Pancreatic cancer Neg Hx    Rectal cancer Neg Hx     Past Surgical History:  Procedure Laterality Date   ANKLE FUSION  left   Dec. 2012   ANTERIOR HIP REVISION Right 11/12/2023   Procedure: REVISION OF RIGHT TOTAL HIP ARTHROPLASTY;  Surgeon: Vernetta Lonni GRADE, MD;  Location: [facility];  Service: Orthopedics;  Laterality: Right;   APPLICATION OF WOUND VAC Left 09/22/2021   Procedure: APPLICATION OF WOUND VAC;  Surgeon: Vernetta Lonni GRADE, MD;  Location: [facility];  Service: Orthopedics;  Laterality: Left;   APPLICATION OF WOUND VAC Left 12/30/2021   Procedure: APPLICATION OF WOUND  VAC;  Surgeon: Vernetta Lonni GRADE, MD;  Location: [facility];  Service: Orthopedics;  Laterality: Left;   APPLICATION OF WOUND VAC Left 04/10/2022   Procedure: APPLICATION OF WOUND VAC;  Surgeon: Harden Jerona GAILS, MD;  Location: [facility];  Service: Orthopedics;  Laterality: Left;   APPLICATION OF WOUND VAC Left 06/09/2024   Procedure: APPLICATION, WOUND VAC, LEFT LOWER LEG;  Surgeon: Harden Jerona GAILS, MD;  Location: [facility];  Service: Orthopedics;  Laterality: Left;   ARTHROSCOPY KNEE W/ DRILLING  bilateral   2012   ATRIAL FIBRILLATION ABLATION N/A 07/15/2023   Procedure: ATRIAL FIBRILLATION ABLATION;  Surgeon: Nancey Eulas BRAVO, MD;  Location: [facility];  Service: Cardiovascular;  Laterality: N/A;   BIOPSY  01/30/2022   Procedure: BIOPSY;  Surgeon: Wilhelmenia Aloha Raddle., MD;  Location: [facility];  Service: Gastroenterology;;   CARDIAC VALVE REPLACEMENT     CARDIOVERSION N/A 01/03/2019   Procedure: CARDIOVERSION;  Surgeon: Shlomo Wilbert SAUNDERS, MD;  Location: [facility]COPY;  Service: Cardiovascular;  Laterality: N/A;   CARDIOVERSION N/A 06/23/2019   Procedure: CARDIOVERSION;  Surgeon: Okey Vina GAILS, MD;  Location: [facility] ENDOSCOPY;  Service: Cardiovascular;  Laterality: N/A;   CARDIOVERSION N/A 12/11/2022   Procedure: CARDIOVERSION;  Surgeon: Rolan Ezra RAMAN, MD;  Location: [facility] ENDOSCOPY;  Service: Cardiovascular;  Laterality: N/A;   CLIPPING OF ATRIAL APPENDAGE  03/21/2019   Procedure: Clipping Of Atrial Appendage using 45mm Atricure Pro2 Clip;  Surgeon: Dusty Sudie DEL, MD;  Location: [facility] OR;  Service: Open Heart Surgery;;   COLONOSCOPY     x2   COLONOSCOPY N/A 01/30/2022   Procedure: COLONOSCOPY;  Surgeon: Wilhelmenia Aloha Raddle., MD;  Location: [facility];  Service: Gastroenterology;  Laterality: N/A;   ESOPHAGOGASTRODUODENOSCOPY N/A 01/30/2022   Procedure: ESOPHAGOGASTRODUODENOSCOPY (EGD);  Surgeon: Wilhelmenia Aloha Raddle., MD;  Location:  [facility];  Service: Gastroenterology;  Laterality: N/A;    FOOT ARTHRODESIS  06/23/2012   Procedure: ARTHRODESIS FOOT;  Surgeon: Norleen Armor, MD;  Location: [facility] OR;  Service: Orthopedics;  Laterality: Left;  Left Subtalar and Talonavicular Joint Revision Arthrodesis  Aspiration of Bone Marrow from Left Hip    HAIR TRANSPLANT     HARDWARE REMOVAL  06/23/2012   Procedure: HARDWARE REMOVAL;  Surgeon: Norleen Armor, MD;  Location: [facility] OR;  Service: Orthopedics;  Laterality: Left;  Removal of Deep Implant  X's 3   I & D EXTREMITY Left 09/23/2021   Procedure: IRRIGATION AND DEBRIDEMENT OF LEG;  Surgeon: Vernetta Lonni GRADE, MD;  [facility];  Service: Orthopedics;  Laterality: Left;   I & D EXTREMITY Left 09/26/2021   Procedure: REPEAT IRRIGATION AND DEBRIDEMENT LEFT LEG, POSSIBLE WOUND CLOSURE, POSSIBLE VAC CHANGE, POSSIBLE SKIN GRAFT;  Surgeon: Vernetta Lonni GRADE, MD;  Location: [facility];  Service: Orthopedics;  Laterality: Left;   I & D EXTREMITY Left 12/30/2021   Procedure: IRRIGATION AND DEBRIDEMENT LEFT LOWER LEG WOUND;  Surgeon: Vernetta Lonni GRADE, MD;  [facility];  Service: Orthopedics;  Laterality: Left;   I & D EXTREMITY Left 01/02/2022   Procedure: LEFT LEG DEBRIDEMENT AND TISSUE GRAFT;  Surgeon: Harden Jerona GAILS, MD;  Location: [facility] OR;  Service: Orthopedics;  Laterality: Left;   INCISION AND DRAINAGE OF WOUND Left 06/09/2024   Procedure: IRRIGATION AND DEBRIDEMENT BILATERAL LOWER LEGS, APPLICATION OF TISSUE GRAFT AND WOUND VAC LEFT LEG;  Surgeon: Harden Jerona GAILS, MD;  [facility]: [facility];  Service: Orthopedics;  Laterality: Left;  LEFT LEG DEBRIDEMENT and TISSUE GRAFT   INCISION AND DRAINAGE WOUND WITH FASCIOTOMY Left 09/22/2021   Procedure: INCISION AND DRAINAGE WOUND WITH FASCIOTOMY;  Surgeon: Vernetta Lonni GRADE, MD;  [facility];  Service: Orthopedics;  Laterality: Left;   INSERTION OF MESH N/A 07/10/2016   Procedure: INSERTION OF MESH;  Surgeon: Vicenta Vernetta, MD;  Location: [facility];  Service: General;  Laterality:  N/A;   JOINT REPLACEMENT     right hip  01-2011   LEFT HEART CATH AND CORONARY ANGIOGRAPHY N/A 03/16/2019   Procedure: LEFT HEART CATH AND CORONARY ANGIOGRAPHY;  Surgeon: Wonda Sharper, MD;  Location: [facility] INVASIVE CV LAB;  Service: Cardiovascular;  Laterality: N/A;   LIMB SPARING RESECTION HIP W/ SADDLE JOINT REPLACEMENT Right    MINIMALLY INVASIVE MAZE PROCEDURE N/A 03/21/2019   Procedure: MINIMALLY INVASIVE MAZE PROCEDURE;  Surgeon: Dusty Sudie DEL, MD;  Location: [facility] OR;  Service: Open Heart Surgery;  Laterality: N/A;   MITRAL VALVE REPAIR Right 03/21/2019   Procedure: MINIMALLY INVASIVE MITRAL VALVE REPAIR (MVR) using Memo 4D ring size 36;  Surgeon: Dusty Sudie DEL, MD;  Location[facility];  Service: Open Heart Surgery;  Laterality: Right;   POLYPECTOMY  01/30/2022   Procedure: POLYPECTOMY;  Surgeon: Mansouraty, Aloha Raddle., MD;  Location: [facility];  Service: Gastroenterology;;   SKIN SPLIT GRAFT Left 04/10/2022   Procedure: APPLY SKIN GRAFT TO LEFT LEG WOUND;  Surgeon: Harden Jerona GAILS, MD;  Location: [facility] OR;  Service: Orthopedics;  Laterality: Left;   TEE WITHOUT CARDIOVERSION N/A 01/03/2019   Procedure: TRANSESOPHAGEAL ECHOCARDIOGRAM (TEE);  Surgeon: Shlomo Wilbert SAUNDERS, MD;  Location: [facility] ENDOSCOPY;  Service: Cardiovascular;  Laterality: N/A;   TEE WITHOUT CARDIOVERSION N/A 03/21/2019   Procedure: TRANSESOPHAGEAL ECHOCARDIOGRAM (TEE);  Surgeon: Dusty Sudie DEL, MD;  Location: [facility] OR;  Service: Open Heart Surgery;  Laterality: N/A;   TEE  WITHOUT CARDIOVERSION N/A 12/11/2022   Procedure: TRANSESOPHAGEAL ECHOCARDIOGRAM (TEE);  Surgeon: Rolan Ezra RAMAN, MD;  Location: [facility] ENDOSCOPY;  Service: Cardiovascular;  Laterality: N/A;   TEMPORARY PACEMAKER N/A 03/22/2019   Procedure: TEMPORARY PACEMAKER;  Surgeon: Claudene Victory ORN, MD;  Location: [facility] INVASIVE CV LAB;  Service: Cardiovascular;  Laterality: N/A;   TOTAL KNEE ARTHROPLASTY Right 01/19/2014   Procedure: RIGHT TOTAL KNEE ARTHROPLASTY, Steroid injection left  knee;  Surgeon: [name], MD;  Location: [facility];  Service: Orthopedics;  Laterality: Right;   TOTAL KNEE ARTHROPLASTY Left 10/23/2014   Procedure: LEFT TOTAL KNEE ARTHROPLASTY;  Surgeon: [name], MD;  Location: [facility];  Service: Orthopedics;  Laterality: Left;   UMBILICAL HERNIA REPAIR N/A 07/10/2016   Procedure: UMBILICAL HERNIA REPAIR;  Surgeon: Vicenta Poli, MD;  Location: [facility];  Service: General;  Laterality: N/A;   Social History   Occupational History   Occupation: Teaching laboratory technician  Tobacco Use   Smoking status: Never   Smokeless tobacco: Never  Vaping Use   Vaping status: Never Used  Substance and Sexual Activity   Alcohol use: Not Currently    Comment: 2 drinks per day of wine or white claw   Drug use: No   Sexual activity: Yes

## 2024-08-06 ENCOUNTER — Encounter: Payer: Self-pay | Admitting: Physician Assistant

## 2024-08-10 ENCOUNTER — Other Ambulatory Visit: Payer: Self-pay | Admitting: Cardiovascular Disease

## 2024-08-10 DIAGNOSIS — I4819 Other persistent atrial fibrillation: Secondary | ICD-10-CM

## 2024-09-04 ENCOUNTER — Encounter: Payer: Self-pay | Admitting: Radiology

## 2024-09-20 ENCOUNTER — Ambulatory Visit: Attending: Cardiovascular Disease | Admitting: *Deleted

## 2024-09-20 DIAGNOSIS — I4811 Longstanding persistent atrial fibrillation: Secondary | ICD-10-CM | POA: Diagnosis not present

## 2024-09-20 DIAGNOSIS — Z7901 Long term (current) use of anticoagulants: Secondary | ICD-10-CM

## 2024-09-20 LAB — POCT INR: INR: 2.3 (ref 2.0–3.0)

## 2024-09-20 NOTE — Progress Notes (Signed)
 Description   INR-2.3; Continue taking warfarin 1.5 tablets daily except 1 tablet on Tuesday, Thursday, and Saturday.   Recheck INR in 8 weeks.  Coumadin  Clinic [phone].

## 2024-09-20 NOTE — Patient Instructions (Signed)
 Description   INR-2.3; Continue taking warfarin 1.5 tablets daily except 1 tablet on Tuesday, Thursday, and Saturday.   Recheck INR in 8 weeks.  Coumadin  Clinic [phone].

## 2024-11-09 ENCOUNTER — Encounter: Payer: Self-pay | Admitting: Cardiovascular Disease

## 2024-11-15 ENCOUNTER — Ambulatory Visit: Admitting: *Deleted

## 2024-11-15 DIAGNOSIS — I4811 Longstanding persistent atrial fibrillation: Secondary | ICD-10-CM

## 2024-11-15 DIAGNOSIS — Z7901 Long term (current) use of anticoagulants: Secondary | ICD-10-CM | POA: Diagnosis not present

## 2024-11-15 LAB — POCT INR: INR: 2.5 (ref 2.0–3.0)

## 2024-11-15 MED ORDER — APIXABAN 5 MG PO TABS: 5.0000 mg | ORAL_TABLET | Freq: Two times a day (BID) | ORAL | 1 refills | Status: AC

## 2024-11-15 NOTE — Patient Instructions (Addendum)
 Description   INR-2.5; Do not take any warfarin today then start eliquis  5mg  twice a day tomorrow-must take 12 hours apart (9am & 9pm, etc)  Coumadin  Clinic [phone].

## 2024-11-15 NOTE — Progress Notes (Signed)
 Pt being started on eliquis  5mg  twice a day.  Description   INR-2.5; Do not take any warfarin today then start eliquis  5mg  twice a day tomorrow-must take 12 hours apart (9am & 9pm, etc)  Coumadin  Clinic [phone].
